# Patient Record
Sex: Male | Born: 1950
Health system: Southern US, Community
[De-identification: ages and names within clinical notes are randomized; demographics above are authoritative.]

## PROBLEM LIST (undated history)

## (undated) DIAGNOSIS — K279 Peptic ulcer, site unspecified, unspecified as acute or chronic, without hemorrhage or perforation: Secondary | ICD-10-CM

## (undated) DIAGNOSIS — I255 Ischemic cardiomyopathy: Secondary | ICD-10-CM

## (undated) DIAGNOSIS — E119 Type 2 diabetes mellitus without complications: Secondary | ICD-10-CM

## (undated) DIAGNOSIS — I739 Peripheral vascular disease, unspecified: Secondary | ICD-10-CM

## (undated) DIAGNOSIS — I10 Essential (primary) hypertension: Secondary | ICD-10-CM

## (undated) DIAGNOSIS — G4733 Obstructive sleep apnea (adult) (pediatric): Secondary | ICD-10-CM

## (undated) DIAGNOSIS — I2510 Atherosclerotic heart disease of native coronary artery without angina pectoris: Secondary | ICD-10-CM

## (undated) DIAGNOSIS — I2102 ST elevation (STEMI) myocardial infarction involving left anterior descending coronary artery: Secondary | ICD-10-CM

## (undated) DIAGNOSIS — I639 Cerebral infarction, unspecified: Secondary | ICD-10-CM

## (undated) DIAGNOSIS — H5440 Blindness, one eye, unspecified eye: Secondary | ICD-10-CM

## (undated) HISTORY — DX: Obstructive sleep apnea (adult) (pediatric): G47.33

## (undated) HISTORY — DX: Type 2 diabetes mellitus without complications: E11.9

## (undated) HISTORY — DX: Peripheral vascular disease, unspecified: I73.9

## (undated) HISTORY — DX: ST elevation (STEMI) myocardial infarction involving left anterior descending coronary artery: I21.02

## (undated) HISTORY — PX: OTHER SURGICAL HISTORY: SHX169

## (undated) HISTORY — DX: Blindness, one eye, unspecified eye: H54.40

## (undated) HISTORY — DX: Essential (primary) hypertension: I10

## (undated) HISTORY — DX: Ischemic cardiomyopathy: I25.5

## (undated) HISTORY — PX: CATARACT EXTRACTION: SUR2

---

## 2001-01-07 ENCOUNTER — Emergency Department (HOSPITAL_COMMUNITY): Admission: EM | Admit: 2001-01-07 | Discharge: 2001-01-07 | Payer: Self-pay | Admitting: Internal Medicine

## 2001-01-20 ENCOUNTER — Emergency Department (HOSPITAL_COMMUNITY): Admission: EM | Admit: 2001-01-20 | Discharge: 2001-01-20 | Payer: Self-pay | Admitting: Emergency Medicine

## 2008-02-03 ENCOUNTER — Emergency Department (HOSPITAL_COMMUNITY): Admission: EM | Admit: 2008-02-03 | Discharge: 2008-02-03 | Payer: Self-pay | Admitting: Emergency Medicine

## 2011-04-20 LAB — DIFFERENTIAL
Basophils Absolute: 0.1
Eosinophils Relative: 0
Lymphocytes Relative: 23
Lymphs Abs: 1.5
Monocytes Absolute: 0.4
Monocytes Relative: 6
Neutro Abs: 4.5

## 2011-04-20 LAB — CBC
HCT: 41.8
Hemoglobin: 13.3
RBC: 5.26
WBC: 6.4

## 2011-04-20 LAB — POCT I-STAT, CHEM 8
BUN: 9
Calcium, Ion: 1.16
Chloride: 107
Creatinine, Ser: 0.9
Glucose, Bld: 131 — ABNORMAL HIGH

## 2011-04-20 LAB — POCT URINALYSIS DIP (DEVICE)
Glucose, UA: NEGATIVE
Nitrite: NEGATIVE

## 2012-11-22 DIAGNOSIS — H401190 Primary open-angle glaucoma, unspecified eye, stage unspecified: Secondary | ICD-10-CM | POA: Insufficient documentation

## 2015-09-28 ENCOUNTER — Inpatient Hospital Stay (HOSPITAL_COMMUNITY): Payer: BLUE CROSS/BLUE SHIELD

## 2015-09-28 ENCOUNTER — Encounter (HOSPITAL_COMMUNITY): Payer: Self-pay | Admitting: Cardiology

## 2015-09-28 ENCOUNTER — Emergency Department (HOSPITAL_COMMUNITY): Payer: BLUE CROSS/BLUE SHIELD

## 2015-09-28 ENCOUNTER — Encounter (HOSPITAL_COMMUNITY)
Admission: EM | Disposition: A | Discharge status: Rehabilitation Facility | Payer: Self-pay | Source: Home / Self Care | Attending: Cardiovascular Disease

## 2015-09-28 ENCOUNTER — Inpatient Hospital Stay (HOSPITAL_COMMUNITY)
Admission: EM | Admit: 2015-09-28 | Discharge: 2015-10-15 | DRG: 246 | Disposition: A | Discharge status: Rehabilitation Facility | Payer: BLUE CROSS/BLUE SHIELD | Attending: Cardiovascular Disease | Admitting: Cardiovascular Disease

## 2015-09-28 DIAGNOSIS — Z8673 Personal history of transient ischemic attack (TIA), and cerebral infarction without residual deficits: Secondary | ICD-10-CM | POA: Insufficient documentation

## 2015-09-28 DIAGNOSIS — R410 Disorientation, unspecified: Secondary | ICD-10-CM | POA: Diagnosis not present

## 2015-09-28 DIAGNOSIS — G9340 Encephalopathy, unspecified: Secondary | ICD-10-CM | POA: Diagnosis not present

## 2015-09-28 DIAGNOSIS — I10 Essential (primary) hypertension: Secondary | ICD-10-CM | POA: Insufficient documentation

## 2015-09-28 DIAGNOSIS — Z09 Encounter for follow-up examination after completed treatment for conditions other than malignant neoplasm: Secondary | ICD-10-CM

## 2015-09-28 DIAGNOSIS — I2510 Atherosclerotic heart disease of native coronary artery without angina pectoris: Secondary | ICD-10-CM

## 2015-09-28 DIAGNOSIS — I429 Cardiomyopathy, unspecified: Secondary | ICD-10-CM | POA: Diagnosis present

## 2015-09-28 DIAGNOSIS — I469 Cardiac arrest, cause unspecified: Secondary | ICD-10-CM

## 2015-09-28 DIAGNOSIS — R54 Age-related physical debility: Secondary | ICD-10-CM | POA: Diagnosis present

## 2015-09-28 DIAGNOSIS — E874 Mixed disorder of acid-base balance: Secondary | ICD-10-CM | POA: Diagnosis present

## 2015-09-28 DIAGNOSIS — E1122 Type 2 diabetes mellitus with diabetic chronic kidney disease: Secondary | ICD-10-CM | POA: Diagnosis present

## 2015-09-28 DIAGNOSIS — N189 Chronic kidney disease, unspecified: Secondary | ICD-10-CM | POA: Diagnosis present

## 2015-09-28 DIAGNOSIS — Z833 Family history of diabetes mellitus: Secondary | ICD-10-CM | POA: Diagnosis not present

## 2015-09-28 DIAGNOSIS — I5023 Acute on chronic systolic (congestive) heart failure: Secondary | ICD-10-CM | POA: Diagnosis present

## 2015-09-28 DIAGNOSIS — I213 ST elevation (STEMI) myocardial infarction of unspecified site: Secondary | ICD-10-CM | POA: Insufficient documentation

## 2015-09-28 DIAGNOSIS — J80 Acute respiratory distress syndrome: Secondary | ICD-10-CM | POA: Diagnosis not present

## 2015-09-28 DIAGNOSIS — D72829 Elevated white blood cell count, unspecified: Secondary | ICD-10-CM | POA: Insufficient documentation

## 2015-09-28 DIAGNOSIS — E1165 Type 2 diabetes mellitus with hyperglycemia: Secondary | ICD-10-CM | POA: Diagnosis present

## 2015-09-28 DIAGNOSIS — J440 Chronic obstructive pulmonary disease with acute lower respiratory infection: Secondary | ICD-10-CM | POA: Diagnosis present

## 2015-09-28 DIAGNOSIS — T502X5A Adverse effect of carbonic-anhydrase inhibitors, benzothiadiazides and other diuretics, initial encounter: Secondary | ICD-10-CM | POA: Diagnosis not present

## 2015-09-28 DIAGNOSIS — R579 Shock, unspecified: Secondary | ICD-10-CM | POA: Diagnosis not present

## 2015-09-28 DIAGNOSIS — I129 Hypertensive chronic kidney disease with stage 1 through stage 4 chronic kidney disease, or unspecified chronic kidney disease: Secondary | ICD-10-CM | POA: Diagnosis present

## 2015-09-28 DIAGNOSIS — I63511 Cerebral infarction due to unspecified occlusion or stenosis of right middle cerebral artery: Secondary | ICD-10-CM | POA: Diagnosis not present

## 2015-09-28 DIAGNOSIS — Z720 Tobacco use: Secondary | ICD-10-CM | POA: Insufficient documentation

## 2015-09-28 DIAGNOSIS — I4892 Unspecified atrial flutter: Secondary | ICD-10-CM | POA: Diagnosis present

## 2015-09-28 DIAGNOSIS — R451 Restlessness and agitation: Secondary | ICD-10-CM | POA: Diagnosis not present

## 2015-09-28 DIAGNOSIS — R401 Stupor: Secondary | ICD-10-CM | POA: Diagnosis not present

## 2015-09-28 DIAGNOSIS — R0682 Tachypnea, not elsewhere classified: Secondary | ICD-10-CM | POA: Diagnosis not present

## 2015-09-28 DIAGNOSIS — I639 Cerebral infarction, unspecified: Secondary | ICD-10-CM | POA: Insufficient documentation

## 2015-09-28 DIAGNOSIS — R9089 Other abnormal findings on diagnostic imaging of central nervous system: Secondary | ICD-10-CM

## 2015-09-28 DIAGNOSIS — Z978 Presence of other specified devices: Secondary | ICD-10-CM

## 2015-09-28 DIAGNOSIS — J9600 Acute respiratory failure, unspecified whether with hypoxia or hypercapnia: Secondary | ICD-10-CM | POA: Insufficient documentation

## 2015-09-28 DIAGNOSIS — J9690 Respiratory failure, unspecified, unspecified whether with hypoxia or hypercapnia: Secondary | ICD-10-CM

## 2015-09-28 DIAGNOSIS — F17200 Nicotine dependence, unspecified, uncomplicated: Secondary | ICD-10-CM | POA: Diagnosis present

## 2015-09-28 DIAGNOSIS — E1140 Type 2 diabetes mellitus with diabetic neuropathy, unspecified: Secondary | ICD-10-CM | POA: Insufficient documentation

## 2015-09-28 DIAGNOSIS — Z9911 Dependence on respirator [ventilator] status: Secondary | ICD-10-CM | POA: Diagnosis not present

## 2015-09-28 DIAGNOSIS — H5442 Blindness, left eye, normal vision right eye: Secondary | ICD-10-CM | POA: Diagnosis present

## 2015-09-28 DIAGNOSIS — I2109 ST elevation (STEMI) myocardial infarction involving other coronary artery of anterior wall: Secondary | ICD-10-CM | POA: Diagnosis not present

## 2015-09-28 DIAGNOSIS — I2102 ST elevation (STEMI) myocardial infarction involving left anterior descending coronary artery: Secondary | ICD-10-CM

## 2015-09-28 DIAGNOSIS — Z452 Encounter for adjustment and management of vascular access device: Secondary | ICD-10-CM

## 2015-09-28 DIAGNOSIS — Z4659 Encounter for fitting and adjustment of other gastrointestinal appliance and device: Secondary | ICD-10-CM

## 2015-09-28 DIAGNOSIS — R071 Chest pain on breathing: Secondary | ICD-10-CM | POA: Diagnosis not present

## 2015-09-28 DIAGNOSIS — R1313 Dysphagia, pharyngeal phase: Secondary | ICD-10-CM | POA: Diagnosis present

## 2015-09-28 DIAGNOSIS — E785 Hyperlipidemia, unspecified: Secondary | ICD-10-CM | POA: Diagnosis present

## 2015-09-28 DIAGNOSIS — I69991 Dysphagia following unspecified cerebrovascular disease: Secondary | ICD-10-CM | POA: Insufficient documentation

## 2015-09-28 DIAGNOSIS — G8190 Hemiplegia, unspecified affecting unspecified side: Secondary | ICD-10-CM

## 2015-09-28 DIAGNOSIS — J15211 Pneumonia due to Methicillin susceptible Staphylococcus aureus: Secondary | ICD-10-CM | POA: Diagnosis present

## 2015-09-28 DIAGNOSIS — K5900 Constipation, unspecified: Secondary | ICD-10-CM | POA: Diagnosis present

## 2015-09-28 DIAGNOSIS — R0603 Acute respiratory distress: Secondary | ICD-10-CM

## 2015-09-28 DIAGNOSIS — G9341 Metabolic encephalopathy: Secondary | ICD-10-CM | POA: Diagnosis present

## 2015-09-28 DIAGNOSIS — G931 Anoxic brain damage, not elsewhere classified: Secondary | ICD-10-CM | POA: Diagnosis not present

## 2015-09-28 DIAGNOSIS — I69391 Dysphagia following cerebral infarction: Secondary | ICD-10-CM | POA: Diagnosis not present

## 2015-09-28 DIAGNOSIS — I2129 ST elevation (STEMI) myocardial infarction involving other sites: Secondary | ICD-10-CM

## 2015-09-28 DIAGNOSIS — J9601 Acute respiratory failure with hypoxia: Secondary | ICD-10-CM | POA: Diagnosis not present

## 2015-09-28 DIAGNOSIS — E870 Hyperosmolality and hypernatremia: Secondary | ICD-10-CM | POA: Diagnosis present

## 2015-09-28 DIAGNOSIS — E876 Hypokalemia: Secondary | ICD-10-CM | POA: Diagnosis present

## 2015-09-28 DIAGNOSIS — D6489 Other specified anemias: Secondary | ICD-10-CM | POA: Diagnosis present

## 2015-09-28 DIAGNOSIS — G8194 Hemiplegia, unspecified affecting left nondominant side: Secondary | ICD-10-CM | POA: Diagnosis present

## 2015-09-28 DIAGNOSIS — I6340 Cerebral infarction due to embolism of unspecified cerebral artery: Secondary | ICD-10-CM | POA: Diagnosis not present

## 2015-09-28 DIAGNOSIS — R4182 Altered mental status, unspecified: Secondary | ICD-10-CM | POA: Insufficient documentation

## 2015-09-28 DIAGNOSIS — Z8249 Family history of ischemic heart disease and other diseases of the circulatory system: Secondary | ICD-10-CM

## 2015-09-28 DIAGNOSIS — Z9289 Personal history of other medical treatment: Secondary | ICD-10-CM

## 2015-09-28 DIAGNOSIS — Z8711 Personal history of peptic ulcer disease: Secondary | ICD-10-CM

## 2015-09-28 DIAGNOSIS — Y95 Nosocomial condition: Secondary | ICD-10-CM | POA: Diagnosis present

## 2015-09-28 DIAGNOSIS — N179 Acute kidney failure, unspecified: Secondary | ICD-10-CM | POA: Diagnosis present

## 2015-09-28 DIAGNOSIS — D649 Anemia, unspecified: Secondary | ICD-10-CM | POA: Insufficient documentation

## 2015-09-28 DIAGNOSIS — I5021 Acute systolic (congestive) heart failure: Secondary | ICD-10-CM | POA: Diagnosis not present

## 2015-09-28 DIAGNOSIS — W19XXXA Unspecified fall, initial encounter: Secondary | ICD-10-CM

## 2015-09-28 DIAGNOSIS — J189 Pneumonia, unspecified organism: Secondary | ICD-10-CM | POA: Insufficient documentation

## 2015-09-28 HISTORY — DX: Atherosclerotic heart disease of native coronary artery without angina pectoris: I25.10

## 2015-09-28 HISTORY — PX: CARDIAC CATHETERIZATION: SHX172

## 2015-09-28 HISTORY — DX: Peptic ulcer, site unspecified, unspecified as acute or chronic, without hemorrhage or perforation: K27.9

## 2015-09-28 HISTORY — DX: Cerebral infarction, unspecified: I63.9

## 2015-09-28 HISTORY — DX: ST elevation (STEMI) myocardial infarction involving left anterior descending coronary artery: I21.02

## 2015-09-28 LAB — COMPREHENSIVE METABOLIC PANEL
ALK PHOS: 133 U/L — AB (ref 38–126)
ALT: 14 U/L — AB (ref 17–63)
AST: 26 U/L (ref 15–41)
Albumin: 3.1 g/dL — ABNORMAL LOW (ref 3.5–5.0)
Anion gap: 19 — ABNORMAL HIGH (ref 5–15)
BUN: 16 mg/dL (ref 6–20)
CALCIUM: 9.1 mg/dL (ref 8.9–10.3)
CHLORIDE: 105 mmol/L (ref 101–111)
CO2: 19 mmol/L — ABNORMAL LOW (ref 22–32)
CREATININE: 1.29 mg/dL — AB (ref 0.61–1.24)
Glucose, Bld: 350 mg/dL — ABNORMAL HIGH (ref 65–99)
Potassium: 4 mmol/L (ref 3.5–5.1)
Sodium: 143 mmol/L (ref 135–145)
TOTAL PROTEIN: 7.1 g/dL (ref 6.5–8.1)
Total Bilirubin: 1 mg/dL (ref 0.3–1.2)

## 2015-09-28 LAB — POCT I-STAT 3, ART BLOOD GAS (G3+)
ACID-BASE DEFICIT: 4 mmol/L — AB (ref 0.0–2.0)
Acid-Base Excess: 3 mmol/L — ABNORMAL HIGH (ref 0.0–2.0)
Acid-base deficit: 14 mmol/L — ABNORMAL HIGH (ref 0.0–2.0)
BICARBONATE: 19.1 meq/L — AB (ref 20.0–24.0)
BICARBONATE: 24.5 meq/L — AB (ref 20.0–24.0)
BICARBONATE: 30.8 meq/L — AB (ref 20.0–24.0)
O2 SAT: 100 %
O2 SAT: 98 %
O2 Saturation: 100 %
PCO2 ART: 83.7 mmHg — AB (ref 35.0–45.0)
PO2 ART: 179 mmHg — AB (ref 80.0–100.0)
PO2 ART: 274 mmHg — AB (ref 80.0–100.0)
PO2 ART: 328 mmHg — AB (ref 80.0–100.0)
TCO2: 22 mmol/L (ref 0–100)
TCO2: 26 mmol/L (ref 0–100)
TCO2: 33 mmol/L (ref 0–100)
pCO2 arterial: 63.1 mmHg (ref 35.0–45.0)
pCO2 arterial: 53.3 mmHg — ABNORMAL HIGH (ref 35.0–45.0)
pH, Arterial: 6.966 — CL (ref 7.350–7.450)
pH, Arterial: 7.198 — CL (ref 7.350–7.450)
pH, Arterial: 7.356 (ref 7.350–7.450)

## 2015-09-28 LAB — POCT I-STAT, CHEM 8
BUN: 19 mg/dL (ref 6–20)
BUN: 18 mg/dL (ref 6–20)
BUN: 24 mg/dL — ABNORMAL HIGH (ref 6–20)
BUN: 19 mg/dL (ref 6–20)
CALCIUM ION: 1.14 mmol/L (ref 1.13–1.30)
CALCIUM ION: 0.94 mmol/L — AB (ref 1.13–1.30)
CHLORIDE: 107 mmol/L (ref 101–111)
CHLORIDE: 110 mmol/L (ref 101–111)
CREATININE: 1.1 mg/dL (ref 0.61–1.24)
CREATININE: 0.7 mg/dL (ref 0.61–1.24)
Calcium, Ion: 1.03 mmol/L — ABNORMAL LOW (ref 1.13–1.30)
Calcium, Ion: 1.1 mmol/L — ABNORMAL LOW (ref 1.13–1.30)
Chloride: 105 mmol/L (ref 101–111)
Chloride: 108 mmol/L (ref 101–111)
Creatinine, Ser: 0.9 mg/dL (ref 0.61–1.24)
Creatinine, Ser: 0.7 mg/dL (ref 0.61–1.24)
GLUCOSE: 414 mg/dL — AB (ref 65–99)
GLUCOSE: 133 mg/dL — AB (ref 65–99)
Glucose, Bld: 325 mg/dL — ABNORMAL HIGH (ref 65–99)
Glucose, Bld: 254 mg/dL — ABNORMAL HIGH (ref 65–99)
HCT: 40 % (ref 39.0–52.0)
HEMATOCRIT: 38 % — AB (ref 39.0–52.0)
HEMATOCRIT: 39 % (ref 39.0–52.0)
HEMATOCRIT: 39 % (ref 39.0–52.0)
HEMOGLOBIN: 13.3 g/dL (ref 13.0–17.0)
HEMOGLOBIN: 13.3 g/dL (ref 13.0–17.0)
Hemoglobin: 13.6 g/dL (ref 13.0–17.0)
Hemoglobin: 12.9 g/dL — ABNORMAL LOW (ref 13.0–17.0)
POTASSIUM: 3.5 mmol/L (ref 3.5–5.1)
POTASSIUM: 2.9 mmol/L — AB (ref 3.5–5.1)
Potassium: 2.8 mmol/L — ABNORMAL LOW (ref 3.5–5.1)
Potassium: 3.5 mmol/L (ref 3.5–5.1)
SODIUM: 146 mmol/L — AB (ref 135–145)
SODIUM: 148 mmol/L — AB (ref 135–145)
SODIUM: 150 mmol/L — AB (ref 135–145)
Sodium: 145 mmol/L (ref 135–145)
TCO2: 22 mmol/L (ref 0–100)
TCO2: 28 mmol/L (ref 0–100)
TCO2: 25 mmol/L (ref 0–100)
TCO2: 23 mmol/L (ref 0–100)

## 2015-09-28 LAB — GLUCOSE, CAPILLARY
GLUCOSE-CAPILLARY: 321 mg/dL — AB (ref 65–99)
GLUCOSE-CAPILLARY: 248 mg/dL — AB (ref 65–99)
GLUCOSE-CAPILLARY: 69 mg/dL (ref 65–99)
GLUCOSE-CAPILLARY: 124 mg/dL — AB (ref 65–99)
GLUCOSE-CAPILLARY: 116 mg/dL — AB (ref 65–99)
GLUCOSE-CAPILLARY: 145 mg/dL — AB (ref 65–99)
GLUCOSE-CAPILLARY: 145 mg/dL — AB (ref 65–99)
GLUCOSE-CAPILLARY: 156 mg/dL — AB (ref 65–99)
GLUCOSE-CAPILLARY: 156 mg/dL — AB (ref 65–99)
Glucose-Capillary: 275 mg/dL — ABNORMAL HIGH (ref 65–99)
Glucose-Capillary: 282 mg/dL — ABNORMAL HIGH (ref 65–99)
Glucose-Capillary: 222 mg/dL — ABNORMAL HIGH (ref 65–99)
Glucose-Capillary: 280 mg/dL — ABNORMAL HIGH (ref 65–99)
Glucose-Capillary: 175 mg/dL — ABNORMAL HIGH (ref 65–99)
Glucose-Capillary: 149 mg/dL — ABNORMAL HIGH (ref 65–99)
Glucose-Capillary: 169 mg/dL — ABNORMAL HIGH (ref 65–99)

## 2015-09-28 LAB — CBC
HCT: 42.2 % (ref 39.0–52.0)
Hemoglobin: 12.5 g/dL — ABNORMAL LOW (ref 13.0–17.0)
MCH: 24.1 pg — AB (ref 26.0–34.0)
MCHC: 29.6 g/dL — ABNORMAL LOW (ref 30.0–36.0)
MCV: 81.5 fL (ref 78.0–100.0)
PLATELETS: 168 10*3/uL (ref 150–400)
RBC: 5.18 MIL/uL (ref 4.22–5.81)
RDW: 13 % (ref 11.5–15.5)
WBC: 10.3 10*3/uL (ref 4.0–10.5)

## 2015-09-28 LAB — BLOOD GAS, ARTERIAL
ACID-BASE DEFICIT: 0.3 mmol/L (ref 0.0–2.0)
BICARBONATE: 24.3 meq/L — AB (ref 20.0–24.0)
Drawn by: 358491
FIO2: 0.4
LHR: 28 {breaths}/min
O2 SAT: 98.7 %
PATIENT TEMPERATURE: 91.4
PCO2 ART: 35.7 mmHg (ref 35.0–45.0)
PEEP/CPAP: 5 cmH2O
PH ART: 7.425 (ref 7.350–7.450)
TCO2: 25.7 mmol/L (ref 0–100)
VT: 550 mL
pO2, Arterial: 117 mmHg — ABNORMAL HIGH (ref 80.0–100.0)

## 2015-09-28 LAB — BASIC METABOLIC PANEL
ANION GAP: 12 (ref 5–15)
Anion gap: 12 (ref 5–15)
Anion gap: 14 (ref 5–15)
BUN: 15 mg/dL (ref 6–20)
BUN: 17 mg/dL (ref 6–20)
BUN: 16 mg/dL (ref 6–20)
CHLORIDE: 110 mmol/L (ref 101–111)
CO2: 24 mmol/L (ref 22–32)
CO2: 22 mmol/L (ref 22–32)
CO2: 21 mmol/L — AB (ref 22–32)
Calcium: 7 mg/dL — ABNORMAL LOW (ref 8.9–10.3)
Calcium: 8.2 mg/dL — ABNORMAL LOW (ref 8.9–10.3)
Calcium: 8.5 mg/dL — ABNORMAL LOW (ref 8.9–10.3)
Chloride: 112 mmol/L — ABNORMAL HIGH (ref 101–111)
Chloride: 112 mmol/L — ABNORMAL HIGH (ref 101–111)
Creatinine, Ser: 1 mg/dL (ref 0.61–1.24)
Creatinine, Ser: 0.81 mg/dL (ref 0.61–1.24)
Creatinine, Ser: 0.76 mg/dL (ref 0.61–1.24)
GFR calc Af Amer: >60 mL/min (ref >60)
GFR calc Af Amer: >60 mL/min (ref >60)
GFR calc Af Amer: >60 mL/min (ref >60)
GFR calc non Af Amer: >60 mL/min (ref >60)
GLUCOSE: 304 mg/dL — AB (ref 65–99)
GLUCOSE: 143 mg/dL — AB (ref 65–99)
GLUCOSE: 176 mg/dL — AB (ref 65–99)
POTASSIUM: 3.7 mmol/L (ref 3.5–5.1)
POTASSIUM: 4.2 mmol/L (ref 3.5–5.1)
POTASSIUM: 3.7 mmol/L (ref 3.5–5.1)
Sodium: 146 mmol/L — ABNORMAL HIGH (ref 135–145)
Sodium: 146 mmol/L — ABNORMAL HIGH (ref 135–145)
Sodium: 147 mmol/L — ABNORMAL HIGH (ref 135–145)

## 2015-09-28 LAB — POCT I-STAT TROPONIN I: TROPONIN I, POC: 0.42 ng/mL — AB (ref 0.00–0.08)

## 2015-09-28 LAB — PROTIME-INR
INR: 1.17 (ref 0.00–1.49)
INR: 2.21 — ABNORMAL HIGH (ref 0.00–1.49)
INR: 1.41 (ref 0.00–1.49)
PROTHROMBIN TIME: 15.1 s (ref 11.6–15.2)
Prothrombin Time: 24.3 seconds — ABNORMAL HIGH (ref 11.6–15.2)
Prothrombin Time: 17.4 seconds — ABNORMAL HIGH (ref 11.6–15.2)

## 2015-09-28 LAB — MRSA PCR SCREENING: MRSA BY PCR: NEGATIVE

## 2015-09-28 LAB — APTT
APTT: 43 s — AB (ref 24–37)
aPTT: 23 seconds — ABNORMAL LOW (ref 24–37)
aPTT: 66 seconds — ABNORMAL HIGH (ref 24–37)

## 2015-09-28 LAB — POCT ACTIVATED CLOTTING TIME
ACTIVATED CLOTTING TIME: 188 s
Activated Clotting Time: 420 seconds

## 2015-09-28 LAB — TROPONIN I
Troponin I: 30.84 ng/mL (ref <0.031)
Troponin I: 60.5 ng/mL (ref <0.031)

## 2015-09-28 LAB — LACTIC ACID, PLASMA
Lactic Acid, Venous: 3.4 mmol/L (ref 0.5–2.0)
Lactic Acid, Venous: 2.5 mmol/L (ref 0.5–2.0)

## 2015-09-28 LAB — MAGNESIUM: Magnesium: 1.7 mg/dL (ref 1.7–2.4)

## 2015-09-28 SURGERY — LEFT HEART CATH AND CORONARY ANGIOGRAPHY
Anesthesia: LOCAL

## 2015-09-28 MED ORDER — BIVALIRUDIN 250 MG IV SOLR
INTRAVENOUS | Status: AC
  Filled 2015-09-28: qty 250

## 2015-09-28 MED ORDER — ASPIRIN 81 MG PO CHEW: 81.0000 mg | CHEWABLE_TABLET | Freq: Every day | ORAL | Status: DC

## 2015-09-28 MED ORDER — ARTIFICIAL TEARS OP OINT
1.0000 "application " | TOPICAL_OINTMENT | Freq: Three times a day (TID) | OPHTHALMIC | Status: DC
  Administered 2015-09-28 – 2015-09-29 (×6): 1 via OPHTHALMIC
  Filled 2015-09-28: qty 3.5

## 2015-09-28 MED ORDER — INFLUENZA VAC SPLIT QUAD 0.5 ML IM SUSY: 0.5000 mL | PREFILLED_SYRINGE | INTRAMUSCULAR | Status: DC | PRN

## 2015-09-28 MED ORDER — MIDAZOLAM HCL 2 MG/2ML IJ SOLN
1.0000 mg | Freq: Once | INTRAMUSCULAR | Status: AC
  Administered 2015-09-28: 1 mg via INTRAVENOUS

## 2015-09-28 MED ORDER — SODIUM BICARBONATE 8.4 % IV SOLN
INTRAVENOUS | Status: DC | PRN
  Administered 2015-09-28: 50 meq via INTRAVENOUS
  Administered 2015-09-28: 100 meq via INTRAVENOUS
  Administered 2015-09-28: 50 meq via INTRAVENOUS

## 2015-09-28 MED ORDER — FENTANYL CITRATE (PF) 100 MCG/2ML IJ SOLN: 50.0000 ug | Freq: Once | INTRAMUSCULAR | Status: DC

## 2015-09-28 MED ORDER — DEXTROSE 50 % IV SOLN
12.5000 mL | Freq: Once | INTRAVENOUS | Status: AC
  Administered 2015-09-28: 12.5 mL via INTRAVENOUS

## 2015-09-28 MED ORDER — ONDANSETRON HCL 4 MG/2ML IJ SOLN: 4.0000 mg | Freq: Four times a day (QID) | INTRAMUSCULAR | Status: DC | PRN

## 2015-09-28 MED ORDER — SODIUM CHLORIDE 0.9% FLUSH: 3.0000 mL | Freq: Two times a day (BID) | INTRAVENOUS | Status: DC

## 2015-09-28 MED ORDER — SODIUM CHLORIDE 0.9 % IV SOLN
2000.0000 mL | Freq: Once | INTRAVENOUS | Status: AC
  Administered 2015-09-28: 2000 mL via INTRAVENOUS

## 2015-09-28 MED ORDER — ASPIRIN 300 MG RE SUPP
300.0000 mg | RECTAL | Status: DC
Start: 2015-09-28 — End: 2015-09-28

## 2015-09-28 MED ORDER — DEXTROSE 5 % IV SOLN
0.0000 ug/min | INTRAVENOUS | Status: DC
  Administered 2015-09-28: 6 ug/min via INTRAVENOUS
  Administered 2015-09-30: 2 ug/min via INTRAVENOUS
  Filled 2015-09-28 (×2): qty 4

## 2015-09-28 MED ORDER — HEPARIN (PORCINE) IN NACL 2-0.9 UNIT/ML-% IJ SOLN
INTRAMUSCULAR | Status: AC
  Filled 2015-09-28: qty 500

## 2015-09-28 MED ORDER — SODIUM CHLORIDE 0.9 % WEIGHT BASED INFUSION: 1.0000 mL/kg/h | INTRAVENOUS | Status: DC

## 2015-09-28 MED ORDER — SODIUM BICARBONATE 8.4 % IV SOLN
INTRAVENOUS | Status: AC
  Filled 2015-09-28: qty 50

## 2015-09-28 MED ORDER — TICAGRELOR 90 MG PO TABS
90.0000 mg | ORAL_TABLET | Freq: Two times a day (BID) | ORAL | Status: DC
  Administered 2015-09-28 – 2015-10-15 (×32): 90 mg via ORAL
  Filled 2015-09-28 (×33): qty 1

## 2015-09-28 MED ORDER — MORPHINE SULFATE (PF) 2 MG/ML IV SOLN: 2.0000 mg | INTRAVENOUS | Status: DC | PRN

## 2015-09-28 MED ORDER — ACETAMINOPHEN 325 MG PO TABS: 650.0000 mg | ORAL_TABLET | ORAL | Status: DC | PRN

## 2015-09-28 MED ORDER — INSULIN ASPART 100 UNIT/ML ~~LOC~~ SOLN
2.0000 [IU] | SUBCUTANEOUS | Status: DC
  Administered 2015-09-28: 4 [IU] via SUBCUTANEOUS
  Administered 2015-09-29: 2 [IU] via SUBCUTANEOUS
  Administered 2015-10-01 (×3): 4 [IU] via SUBCUTANEOUS
  Administered 2015-10-02 (×2): 6 [IU] via SUBCUTANEOUS
  Administered 2015-10-02 (×3): 2 [IU] via SUBCUTANEOUS
  Administered 2015-10-03: 4 [IU] via SUBCUTANEOUS
  Administered 2015-10-03: 2 [IU] via SUBCUTANEOUS
  Administered 2015-10-03: 4 [IU] via SUBCUTANEOUS
  Administered 2015-10-03 (×2): 6 [IU] via SUBCUTANEOUS
  Administered 2015-10-03: 4 [IU] via SUBCUTANEOUS
  Administered 2015-10-04 (×2): 2 [IU] via SUBCUTANEOUS
  Administered 2015-10-04: 6 [IU] via SUBCUTANEOUS
  Administered 2015-10-05 (×2): 2 [IU] via SUBCUTANEOUS

## 2015-09-28 MED ORDER — POTASSIUM CHLORIDE 10 MEQ/50ML IV SOLN
10.0000 meq | INTRAVENOUS | Status: AC
  Administered 2015-09-28 (×4): 10 meq via INTRAVENOUS
  Filled 2015-09-28 (×2): qty 50

## 2015-09-28 MED ORDER — CHLORHEXIDINE GLUCONATE 0.12% ORAL RINSE (MEDLINE KIT)
15.0000 mL | Freq: Two times a day (BID) | OROMUCOSAL | Status: DC
  Administered 2015-09-28 – 2015-10-13 (×31): 15 mL via OROMUCOSAL

## 2015-09-28 MED ORDER — SODIUM CHLORIDE 0.9 % IV SOLN
INTRAVENOUS | Status: DC | PRN
  Administered 2015-09-28: 1000 mL via INTRAVENOUS

## 2015-09-28 MED ORDER — SODIUM CHLORIDE 0.9 % IV SOLN
INTRAVENOUS | Status: DC
  Administered 2015-09-28: 2.2 [IU]/h via INTRAVENOUS

## 2015-09-28 MED ORDER — BIVALIRUDIN BOLUS VIA INFUSION - CUPID
INTRAVENOUS | Status: DC | PRN
  Administered 2015-09-28: 75 mg/kg/min via INTRAVENOUS

## 2015-09-28 MED ORDER — HEPARIN SODIUM (PORCINE) 5000 UNIT/ML IJ SOLN
4000.0000 [IU] | Freq: Once | INTRAMUSCULAR | Status: AC
  Administered 2015-09-28: 4000 [IU] via INTRAVENOUS

## 2015-09-28 MED ORDER — FENTANYL BOLUS VIA INFUSION
50.0000 ug | INTRAVENOUS | Status: DC | PRN
  Filled 2015-09-28: qty 50

## 2015-09-28 MED ORDER — ROCURONIUM BROMIDE 50 MG/5ML IV SOLN
INTRAVENOUS | Status: AC | PRN
  Administered 2015-09-28: 100 mg via INTRAVENOUS

## 2015-09-28 MED ORDER — SODIUM CHLORIDE 0.9 % IV SOLN: 250.0000 mL | INTRAVENOUS | Status: DC | PRN

## 2015-09-28 MED ORDER — MIDAZOLAM BOLUS VIA INFUSION
1.0000 mg | INTRAVENOUS | Status: DC | PRN
  Administered 2015-10-01: 1 mg via INTRAVENOUS
  Filled 2015-09-28 (×2): qty 1

## 2015-09-28 MED ORDER — SODIUM CHLORIDE 0.9 % IV SOLN
0.0000 mg/h | INTRAVENOUS | Status: DC
  Administered 2015-09-28: 2 mg/h via INTRAVENOUS
  Administered 2015-09-28 (×2): 5 mg/h via INTRAVENOUS
  Administered 2015-09-29: 3 mg/h via INTRAVENOUS
  Filled 2015-09-28 (×5): qty 10

## 2015-09-28 MED ORDER — INSULIN ASPART 100 UNIT/ML ~~LOC~~ SOLN: 2.0000 [IU] | SUBCUTANEOUS | Status: DC

## 2015-09-28 MED ORDER — CISATRACURIUM BOLUS VIA INFUSION
0.1000 mg/kg | Freq: Once | INTRAVENOUS | Status: AC
  Administered 2015-09-28: 6.9 mg via INTRAVENOUS
  Filled 2015-09-28: qty 7

## 2015-09-28 MED ORDER — BIVALIRUDIN 250 MG IV SOLR
250.0000 mg | INTRAVENOUS | Status: DC | PRN
  Administered 2015-09-28: 1.75 mg/kg/h via INTRAVENOUS

## 2015-09-28 MED ORDER — ANTISEPTIC ORAL RINSE SOLUTION (CORINZ)
7.0000 mL | OROMUCOSAL | Status: DC
  Administered 2015-09-28 – 2015-10-13 (×152): 7 mL via OROMUCOSAL

## 2015-09-28 MED ORDER — SODIUM CHLORIDE 0.9 % IV SOLN: 25.0000 ug/h | INTRAVENOUS | Status: DC

## 2015-09-28 MED ORDER — ALBUTEROL SULFATE (2.5 MG/3ML) 0.083% IN NEBU
2.5000 mg | INHALATION_SOLUTION | RESPIRATORY_TRACT | Status: DC | PRN
  Administered 2015-10-12: 2.5 mg via RESPIRATORY_TRACT
  Filled 2015-09-28: qty 3

## 2015-09-28 MED ORDER — ASPIRIN 300 MG RE SUPP
300.0000 mg | Freq: Once | RECTAL | Status: AC
  Administered 2015-09-28: 300 mg via RECTAL

## 2015-09-28 MED ORDER — MIDAZOLAM HCL 2 MG/2ML IJ SOLN
INTRAMUSCULAR | Status: AC
  Filled 2015-09-28: qty 2

## 2015-09-28 MED ORDER — TICAGRELOR 90 MG PO TABS
ORAL_TABLET | ORAL | Status: DC | PRN
  Administered 2015-09-28: 180 mg via JEJUNOSTOMY

## 2015-09-28 MED ORDER — SODIUM CHLORIDE 0.9 % IV SOLN: INTRAVENOUS | Status: DC

## 2015-09-28 MED ORDER — SODIUM CHLORIDE 0.9 % IV SOLN
INTRAVENOUS | Status: DC
  Administered 2015-09-28 – 2015-09-29 (×3): via INTRAVENOUS

## 2015-09-28 MED ORDER — TICAGRELOR 90 MG PO TABS
ORAL_TABLET | ORAL | Status: AC
  Filled 2015-09-28: qty 1

## 2015-09-28 MED ORDER — SODIUM CHLORIDE 0.9 % IV SOLN: 1.0000 mg/h | INTRAVENOUS | Status: DC

## 2015-09-28 MED ORDER — FENTANYL BOLUS VIA INFUSION
25.0000 ug | INTRAVENOUS | Status: DC | PRN
  Administered 2015-10-02: 25 ug via INTRAVENOUS
  Filled 2015-09-28: qty 25

## 2015-09-28 MED ORDER — IOHEXOL 350 MG/ML SOLN
INTRAVENOUS | Status: DC | PRN
  Administered 2015-09-28: 140 mL via INTRA_ARTERIAL

## 2015-09-28 MED ORDER — INSULIN REGULAR HUMAN 100 UNIT/ML IJ SOLN
INTRAMUSCULAR | Status: DC
  Filled 2015-09-28: qty 2.5

## 2015-09-28 MED ORDER — PANTOPRAZOLE SODIUM 40 MG IV SOLR
40.0000 mg | INTRAVENOUS | Status: DC
  Administered 2015-09-28 – 2015-10-02 (×5): 40 mg via INTRAVENOUS
  Filled 2015-09-28 (×5): qty 40

## 2015-09-28 MED ORDER — DEXTROSE 50 % IV SOLN
INTRAVENOUS | Status: AC
  Filled 2015-09-28: qty 50

## 2015-09-28 MED ORDER — HEPARIN SODIUM (PORCINE) 5000 UNIT/ML IJ SOLN
5000.0000 [IU] | Freq: Three times a day (TID) | INTRAMUSCULAR | Status: DC
  Administered 2015-09-28 – 2015-10-08 (×29): 5000 [IU] via SUBCUTANEOUS
  Filled 2015-09-28 (×28): qty 1

## 2015-09-28 MED ORDER — FENTANYL CITRATE (PF) 100 MCG/2ML IJ SOLN
INTRAMUSCULAR | Status: AC
  Filled 2015-09-28: qty 2

## 2015-09-28 MED ORDER — ETOMIDATE 2 MG/ML IV SOLN
INTRAVENOUS | Status: AC | PRN
  Administered 2015-09-28: 20 mg via INTRAVENOUS

## 2015-09-28 MED ORDER — SODIUM CHLORIDE 0.9 % IV SOLN
25.0000 ug/h | INTRAVENOUS | Status: DC
  Administered 2015-09-28: 50 ug/h via INTRAVENOUS
  Administered 2015-09-28: 150 ug/h via INTRAVENOUS
  Administered 2015-09-29: 200 ug/h via INTRAVENOUS
  Administered 2015-09-29 (×2): 300 ug/h via INTRAVENOUS
  Filled 2015-09-28 (×5): qty 50

## 2015-09-28 MED ORDER — SODIUM CHLORIDE 0.9% FLUSH: 3.0000 mL | INTRAVENOUS | Status: DC | PRN

## 2015-09-28 MED ORDER — SODIUM CHLORIDE 0.9 % IV SOLN
1.0000 mg/kg/h | INTRAVENOUS | Status: AC
  Administered 2015-09-28: 1 mg/kg/h via INTRAVENOUS
  Filled 2015-09-28: qty 250

## 2015-09-28 MED ORDER — SODIUM BICARBONATE 8.4 % IV SOLN
INTRAVENOUS | Status: AC
Start: 2015-09-28 — End: 2015-09-28
  Filled 2015-09-28: qty 100

## 2015-09-28 MED ORDER — HEPARIN SODIUM (PORCINE) 5000 UNIT/ML IJ SOLN: 4000.0000 [IU] | INTRAMUSCULAR | Status: DC

## 2015-09-28 MED ORDER — SODIUM CHLORIDE 0.9 % IV SOLN
1.0000 ug/kg/min | INTRAVENOUS | Status: DC
  Administered 2015-09-28: 1 ug/kg/min via INTRAVENOUS
  Administered 2015-09-29: 1.5 ug/kg/min via INTRAVENOUS
  Filled 2015-09-28 (×2): qty 20

## 2015-09-28 MED ORDER — INSULIN GLARGINE 100 UNIT/ML ~~LOC~~ SOLN
10.0000 [IU] | SUBCUTANEOUS | Status: DC
  Administered 2015-09-28: 10 [IU] via SUBCUTANEOUS
  Filled 2015-09-28 (×2): qty 0.1

## 2015-09-28 MED ORDER — ASPIRIN 81 MG PO CHEW
81.0000 mg | CHEWABLE_TABLET | Freq: Every day | ORAL | Status: DC
  Administered 2015-09-29 – 2015-10-15 (×16): 81 mg
  Filled 2015-09-28 (×16): qty 1

## 2015-09-28 MED ORDER — POTASSIUM CHLORIDE 20 MEQ/15ML (10%) PO SOLN
80.0000 meq | Freq: Once | ORAL | Status: AC
  Administered 2015-09-28: 80 meq
  Filled 2015-09-28: qty 60

## 2015-09-28 MED ORDER — LIDOCAINE HCL (PF) 1 % IJ SOLN
INTRAMUSCULAR | Status: AC
  Filled 2015-09-28: qty 30

## 2015-09-28 MED ORDER — CISATRACURIUM BOLUS VIA INFUSION
0.0500 mg/kg | INTRAVENOUS | Status: DC | PRN
  Filled 2015-09-28: qty 4

## 2015-09-28 MED ORDER — LIDOCAINE HCL (PF) 1 % IJ SOLN
INTRAMUSCULAR | Status: DC | PRN
  Administered 2015-09-28: 20 mL via INTRADERMAL

## 2015-09-28 SURGICAL SUPPLY — 17 items
BALLN EMERGE MR 2.0X12 (BALLOONS) ×2
BALLN ~~LOC~~ EMERGE MR 3.25X12 (BALLOONS) ×2
BALLOON EMERGE MR 2.0X12 (BALLOONS) ×1 IMPLANT
BALLOON ~~LOC~~ EMERGE MR 3.25X12 (BALLOONS) ×1 IMPLANT
CATH EXPO 5F ANGLED MULTIPACK (CATHETERS) ×2 IMPLANT
CATH VISTA GUIDE 6FR XBLAD3.5 (CATHETERS) ×2 IMPLANT
DEVICE CONTINUOUS FLUSH (MISCELLANEOUS) ×2 IMPLANT
KIT ENCORE 26 ADVANTAGE (KITS) ×2 IMPLANT
KIT HEART LEFT (KITS) ×2 IMPLANT
PACK CARDIAC CATHETERIZATION (CUSTOM PROCEDURE TRAY) ×2 IMPLANT
SHEATH PINNACLE 6F 10CM (SHEATH) ×2 IMPLANT
STENT SYNERGY DES 3X16 (Permanent Stent) ×2 IMPLANT
SYR MEDRAD MARK V 150ML (SYRINGE) ×2 IMPLANT
TRANSDUCER W/STOPCOCK (MISCELLANEOUS) ×2 IMPLANT
TUBING CIL FLEX 10 FLL-RA (TUBING) ×2 IMPLANT
WIRE ASAHI PROWATER 180CM (WIRE) ×2 IMPLANT
WIRE EMERALD 3MM-J .035X150CM (WIRE) ×2 IMPLANT

## 2015-09-28 NOTE — Progress Notes (Signed)
RT assisted transfer of intubated/vented patient to CATH lab. Report given to Saline, RT.

## 2015-09-28 NOTE — Progress Notes (Signed)
CRITICAL VALUE ALERT  Critical value received:  Lactic acid 3.4, troponin 30.84  Date of notification:  09/28/15  Time of notification:  0720, 0740  Critical value read back:Yes.    Nurse who received alert:  Renita Papa, RN  MD notified (1st page):  CCM  Time of first page:  [phone]

## 2015-09-28 NOTE — Care Management Note (Signed)
Case Management Note  Patient Details  Name: Mohamad Westgate MRN: [id number] Date of Birth: 05/25/1961  Subjective/Objective:    Adm w cardiac arrest, vent                Action/Plan: lives w wife   Expected Discharge Date:                  Expected Discharge Plan:     In-House Referral:     Discharge planning Services     Post Acute Care Choice:    Choice offered to:     DME Arranged:    DME Agency:     HH Arranged:    HH Agency:     Status of Service:     Medicare Important Message Given:    Date Medicare IM Given:    Medicare IM give by:    Date Additional Medicare IM Given:    Additional Medicare Important Message give by:     If discussed at Long Length of Stay Meetings, dates discussed:    Additional Comments: ur review done  Hanley Hays, RN 09/28/2015, 7:29 AM

## 2015-09-28 NOTE — Code Documentation (Signed)
Pulses present, CPR stopped.  

## 2015-09-28 NOTE — Progress Notes (Signed)
Echocardiogram 2D Echocardiogram has been performed.  Nolon Rod 09/28/2015, 2:46 PM

## 2015-09-28 NOTE — Progress Notes (Signed)
EEG Completed; Results Pending  

## 2015-09-28 NOTE — Progress Notes (Signed)
Chaplain ws paged for a Stemi. Chaplain responded while Pt was still in EMS. Pt arrested while in route. Pulse was restored .Chaplain waited until family arrived. Chaplain escorted spouse to waiting area and provided nourishment. Chaplain left and returned and sat with family until Dr. Truitt Merle spouse of Pt's condition. Chaplain escorted spouse to [facility] waiting and notified nurse.    09/28/15 0500  Clinical Encounter Type  Visited With Family  Visit Type Initial;Spiritual support  Referral From Care management  Spiritual Encounters  Spiritual Needs Prayer;Emotional  Stress Factors  Family Stress Factors Major life changes

## 2015-09-28 NOTE — Code Documentation (Signed)
Pt escorted to cath lab by this RN.

## 2015-09-28 NOTE — H&P (Signed)
CARDIOLOGY ADMISSION NOTE  Patient ID: Eddie [name] MRN: [id number] DOB/AGE: 65/08/1960 65 y.o.  Admit date: 09/28/2015 Primary Physician   None Primary Cardiologist   None Chief Complaint    Chest pain  HPI:  The patient presented with chest pain.  He is intubated and unresponsive.  The history comes from EMS staff and his wife.  He apparently started to have chest pain about one hour ago.   Intial EKG showed sinus tachycardia with ST elevation in V2 through V4.  EMS was called.  He became unresponsive shortly before arriving in the ED.  He was intubated in the ambulance and treated with Epi for presumed PEA.  He did get brief chest compressions.  He responded by developing atrial flutter and became hypertensive.  King airway was replaced and he is brought urgently to the ED.  Per his wife he had no recent complaints.  He has not been having any chest pain.     Past Medical History  Diagnosis Date  . PUD (peptic ulcer disease)     Past Surgical History  Procedure Laterality Date  . Repair of peptic ulcer      Allergies not on file No current facility-administered medications on file prior to encounter.   No current outpatient prescriptions on file prior to encounter.   Social History   Social History  . Marital Status: N/A    Spouse Name: N/A  . Number of Children: N/A  . Years of Education: N/A   Occupational History  . Not on file.   Social History Main Topics  . Smoking status: Not on file  . Smokeless tobacco: Not on file  . Alcohol Use: Not on file  . Drug Use: Not on file  . Sexual Activity: Not on file   Other Topics Concern  . Not on file   Social History Narrative  . No narrative on file    FAMILY HISTORY:  Unable to obtain.  ROS:  As stated in the HPI and negative for all other systems.  Physical Exam: Blood pressure 190/125, pulse 141, resp. rate 49, SpO2 99 %.  GENERAL:   Critically ill appearing HEENT:  Pupils fixed and orally intubated.  NECK:   No jugular venous distention, waveform within normal limits, carotid upstroke brisk and symmetric, no bruits, no thyromegaly LYMPHATICS:  No cervical, inguinal adenopathy LUNGS:  Clear to auscultation bilaterally BACK:  No CVA tenderness CHEST:  Unremarkable HEART:  PMI not displaced or sustained,S1 and S2 within normal limits, no S3, no S4, no clicks, no rubs, no murmurs ABD:  Flat, decreased bowel sounds, no bruits, no rebound, no guarding, no midline pulsatile mass, no hepatomegaly, no splenomegaly EXT:  2 plus pulses throughout, no edema, no cyanosis no clubbing SKIN:  No rashes no nodules NEURO:  Cranial nerves II through XII grossly intact, motor grossly intact throughout PSYCH:  Cognitively intact, oriented to person place and time   Labs: No results found for: BUN No results found for: CREATININE No results found for: NA, K, CL, CO2 No results found for: TROPONINI Lab Results  Component Value Date   WBC 10.3 09/28/2015   HGB 12.5* 09/28/2015   HCT 42.2 09/28/2015   MCV 81.5 09/28/2015   PLT 168 09/28/2015      Radiology:  CXR:  Diffuse edema  EKG:  As above.   ASSESSMENT AND PLAN:    ACUTE ANTERIOR MI WITH CARDIAC ARREST:  The patient will be taken urgently to the cath lab.  TOBACCO ABUSE:  The patient will be educated.    SignedRollene Rotunda 09/28/2015, 4:47 AM

## 2015-09-28 NOTE — Code Documentation (Signed)
Pt arrives via [facility] for [facility], during transport pt deteriorated significantly and became unresponsive. Bagged via EMS. Stemi noted on EMS EKG. PEA arrest at 0401 on EMS dock. Brought into room, ROSC achieved at 0407. 1 EPI given by EMS. King airway and left IO placed by EMS.

## 2015-09-28 NOTE — Procedures (Signed)
ELECTROENCEPHALOGRAM REPORT  Patient: Eddie [name]       Room #: 1O84 EEG No. ID: [id number] Age: 65 y.o.        Sex: male Referring Physician: Erlene Quan Report Date:  09/28/2015        Interpreting Physician: Aline Brochure  History: Eddie [name] is an 65 y.o. male who presented with an acute STEMI and PEA arrest.  Indications for study: Assess severity of encephalopathy; rule out seizure activity.   Technique: This is an 18 channel routine scalp EEG performed at the bedside with bipolar and monopolar montages arranged in accordance to the international 10/20 system of electrode placement.   Description: Patient was intubated and on mechanical ventilation at the time of this study. He was also hypothermic with core temperature of 32.9 Celsius, and sedated with Versed and fentanyl. Cerebral cortical activity was diffusely low in amplitude, markedly suppressed. The predominant activity was mixed delta and theta activity with occasional runs of rhythmic fast activity which appeared to be most prominent in the central head region. Fairly frequent vertex sharp waves were also recorded. Photic stimulation was not performed. No epileptiform discharges were recorded.  Interpretation: This EEG is abnormal with markedly suppressed continuous generalized moderately severe slowing of cerebral activity which in part is likely due to hypothermia, as well as sedating medications. Extent of encephalopathic process, if present, cannot be determined at this point. Repeat study when patient's core temperature is back to normal is recommended area no evidence of a platform activity was demonstrated.   Venetia Maxon M.D. [facility] [phone]

## 2015-09-28 NOTE — ED Provider Notes (Signed)
CSN: [id number]     Arrival date & time 09/28/15  0402 History  By signing my name below, I, Bethel Born, attest that this documentation has been prepared under the direction and in the presence of Shon Baton, MD. Electronically Signed: Bethel Born, ED Scribe. 09/28/2015. 4:16 AM   No chief complaint on file.  The history is provided by the EMS personnel. No language interpreter was used.   Brought in by EMS post-arrest, Eddie [name] is a 65 y.o. male with no significant medical history who presents to the Emergency Department for evaluation after cardiac arrest. Pt called EMS for SOB and was initially being brought in as a Code STEMI before arresting in the ambulance bay. EMS noted PEA and pt was given 1 round of epinephrine PTA. He was not given aspirin PTA.   Upon arrival to the ambulance bay, pulse check with good pulse. Tachycardia. Blood pressure 163/116. Is taking breaths independently but minimally responsive.  No past medical history on file. No past surgical history on file. No family history on file. Social History  Substance Use Topics  . Smoking status: Not on file  . Smokeless tobacco: Not on file  . Alcohol Use: Not on file    Review of Systems  Unable to perform ROS: Acuity of condition   Allergies  Review of patient's allergies indicates not on file.  Home Medications   Prior to Admission medications   Not on File   BP 190/125 mmHg  Pulse 141  Resp 49  SpO2 100% Physical Exam  Constitutional:  Appears older than stated age, unresponsive, agonal breathing  HENT:  Head: Normocephalic and atraumatic.  Eyes:  Pupils 4 mm and minimally reactive bilaterally  Cardiovascular: Normal heart sounds.   No murmur heard. Tachycardia, irregular rhythm  Pulmonary/Chest: He has no wheezes. He has rales.  frothy sputum noted at the mouth, agonal respirations  Abdominal: Soft. He exhibits no distension.  Musculoskeletal: He exhibits no edema.   Neurological:  GCS 3  Skin: Skin is warm and dry.  Psychiatric: He has a normal mood and affect.  Nursing note and vitals reviewed.   ED Course  Procedures (including critical care time)  CRITICAL CARE Performed by: Shon Baton   Total critical care time: 30 minutes  Critical care time was exclusive of separately billable procedures and treating other patients.  Critical care was necessary to treat or prevent imminent or life-threatening deterioration.  Critical care was time spent personally by me on the following activities: development of treatment plan with patient and/or surrogate as well as nursing, discussions with consultants, evaluation of patient's response to treatment, examination of patient, obtaining history from patient or surrogate, ordering and performing treatments and interventions, ordering and review of laboratory studies, ordering and review of radiographic studies, pulse oximetry and re-evaluation of patient's condition.   COORDINATION OF CARE: 4:14 AM Dr. Antoine Poche (Cardiology) is at the bedside. Recommends Heparin.   INTUBATION Performed [id number] F Denya Buckingham, MD Required items: required blood products, implants, devices, and special equipment available Patient identity confirmed: provided demographic data and hospital-assigned identification number Time out: Immediately prior to procedure a "time out" was called to verify the correct patient, procedure, equipment, support staff and site/side marked as required.  Indications: arrest  Intubation method: Glidescope Laryngoscopy   Preoxygenation: BVM  Sedatives: 20 mg Etomidate Paralytic: 100 mg Rocuronium  Tube Size: 7.5 cuffed  Post-procedure assessment: chest rise and ETCO2 monitor Breath sounds: equal and absent over the epigastrium Tube  secured with: ETT holder Chest x-ray interpreted by radiologist and me.  Chest x-ray findings: endotracheal tube in appropriate position  Patient  tolerated the procedure well with no immediate complications.    Labs Review Labs Reviewed  APTT  CBC  COMPREHENSIVE METABOLIC PANEL  PROTIME-INR  I-STAT TROPOININ, ED  I-STAT TROPOININ, ED  I-STAT TROPOININ, ED    Imaging Review No results found. I have personally reviewed and evaluated these images and lab results as part of my medical decision-making.   EKG Interpretation   Date/Time:  Tuesday September 28 2015 04:09:51 EST Ventricular Rate:  191 PR Interval:    QRS Duration: 110 QT Interval:  249 QTC Calculation: 444 R Axis:   25 Text Interpretation:  Atrial fibrillation with rapid V-rate Incomplete  left bundle branch block Inferior infarct, acute (RCA) Probable RV  involvement, suggest recording right precordial leads ** ** ACUTE MI /  STEMI ** ** Confirmed by Neftali Thurow  MD, Audiel Scheiber ([id number]) on 09/28/2015 4:23:17  AM      MDM   Final diagnoses:  ST elevation myocardial infarction (STEMI) involving other coronary artery [facility])  Cardiac arrest [facility])    She presents as a code STEMI. He coded just prior to arrival. Received 1 mg of epinephrine. Upon arrival to the bay he had return of spontaneous circulations. [name] but does have agonal respirations. Repeat EKG concerning for acute ST elevation MI. He also has evidence of atrial fibrillation versus flutter. No reported history. He was given heparin and aspirin. He was intubated. Dr. Antoine Poche is at the bedside.  Patient prepped for the Cath Lab.    I personally performed the services described in this documentation, which was scribed in my presence. The recorded information has been reviewed and is accurate.    Shon Baton, MD 09/28/15 [phone]

## 2015-09-28 NOTE — Consult Note (Signed)
PULMONARY / CRITICAL CARE MEDICINE   Name: Eddie [name] MRN: [id number] DOB: 05/25/1961    ADMISSION DATE:  09/28/2015 CONSULTATION DATE:  09/28/15  REFERRING MD:  Antoine Poche  CHIEF COMPLAINT:  Cardiac Arrest  HISTORY OF PRESENT ILLNESS:  Pt is encephelopathic; therefore, this HPI is obtained from chart review. Eddie [name] is a 65 y.o. male with PMH of PUD.  During early AM hours 03/06, EMS was dispatched for SOB and chest pain.  EKG via EMS revealed STEMI.  He was being transported to [facility] ED and upon arrival in ambulance bay, he went unresponsive and staff noted him to be in PEA. He was transferred to ED room where ACLS was continued and he was intubated.  He received 1mg  epinephrine and roughly 6 minutes of ACLS prior to ROSC.   Post ROSC, he was in a.flutter with hypertension.  He was taken to the cath lab emergently and PCCM was called for consideration of hypothermia protocol following cardiac cath.   PAST MEDICAL HISTORY :  He  has a past medical history of PUD (peptic ulcer disease).  (-) DM,CAD, CVA. Was "healthy" according to wife  PAST SURGICAL HISTORY: He  has past surgical history that includes Repair of Peptic Ulcer.  Allergies not on file  No current facility-administered medications on file prior to encounter.   No current outpatient prescriptions on file prior to encounter.    FAMILY HISTORY:  His has no family status information on file.   DM and CAD in mother side. (-) h/o cancer.  SOCIAL HISTORY: He  is married. He has 2 children. Smokes 2-5 cigs/day since mid teens. (-) ETOH.  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   REVIEW OF SYSTEMS:  Unable to obtain as pt is encephalopathic. According to wife, pt with chronic on and off cp and cough and SOB. (-) recent illness. (-) fevers, chills, abd pain, edema. Rest of ROS was (-).   SUBJECTIVE:  On vent, non-responsive.  VITAL SIGNS: BP 190/125 mmHg  Pulse 141  Resp 49  SpO2 99%  HEMODYNAMICS:    VENTILATOR  SETTINGS: Vent Mode:  [-] PRVC FiO2 (%):  [100 %] 100 % Set Rate:  [20 bmp] 20 bmp Vt Set:  [500 mL] 500 mL PEEP:  [5 cmH20] 5 cmH20  INTAKE / OUTPUT:     PHYSICAL EXAMINATION: General: Adult AA male, non-responsive. Neuro: Sedated.  Per report, non-responsive prior to initiation of sedation. HEENT: [place]/AT. L pupil was 2 mm, R pupil was 3 mm. Non responsive.  Cardiovascular: RRR, no M/R/G.  Lungs: Respirations even and unlabored.  Rhonchi B.  Abdomen: BS x 4, soft, NT/ND.  Musculoskeletal: No gross deformities, no edema.  Skin: Intact, warm, no rashes.   LABS:  BMET  Recent Labs Lab 09/28/15 0410 09/28/15 0444  NA 143 145  K 4.0 2.8*  CL 105 107  CO2 19*  --   BUN 16 19  CREATININE 1.29* 1.10  GLUCOSE 350* 414*    Electrolytes  Recent Labs Lab 09/28/15 0410  CALCIUM 9.1    CBC  Recent Labs Lab 09/28/15 0410 09/28/15 0444  WBC 10.3  --   HGB 12.5* 13.6  HCT 42.2 40.0  PLT 168  --     Coag's  Recent Labs Lab 09/28/15 0410  APTT 23*  INR 1.17    Sepsis Markers No results for input(s): LATICACIDVEN, PROCALCITON, O2SATVEN in the last 168 hours.  ABG  Recent Labs Lab 09/28/15 0444 09/28/15 0512  PHART  6.966* 7.198*  PCO2ART 83.7* 63.1*  PO2ART 179.0* 274.0*    Liver Enzymes  Recent Labs Lab 09/28/15 0410  AST 26  ALT 14*  ALKPHOS 133*  BILITOT 1.0  ALBUMIN 3.1*    Cardiac Enzymes No results for input(s): TROPONINI, PROBNP in the last 168 hours.  Glucose No results for input(s): GLUCAP in the last 168 hours.  Imaging Dg Chest Port 1 View  09/28/2015  CLINICAL DATA:  Post CPR.  Code STEMI. EXAM: PORTABLE CHEST 1 VIEW COMPARISON:  None. FINDINGS: Endotracheal tube with tip measuring 4.2 cm above the carina. Enteric tube tip is off the field of view but below the left hemidiaphragm. Borderline heart size with normal pulmonary vascularity. Bilateral diffuse parenchymal infiltrates centered in the perihilar regions. This could  represent fluid overload, pneumonia, or aspiration. Visualized bones appear intact. IMPRESSION: Appliances appear in satisfactory location. Diffuse bilateral parenchymal infiltrates could indicate edema, pneumonia, or aspiration. Electronically Signed   By: Burman Nieves M.D.   On: 09/28/2015 04:39     STUDIES:  Cardiac Cath 03/07 > 100% LAD occlusion > DES placed. CXR 03/07 > pulm edema  CULTURES: None.  ANTIBIOTICS: None.  SIGNIFICANT EVENTS: 03/07 > PEA arrest > taken to cath lab then returned to ICU on vent  > hypothermia started   LINES/TUBES: ETT 03/07 > CVL pending 03/07 > A line pending 03/07 >  DISCUSSION: 65 y.o. M with PMH of PUD.  Was in USOH until early AM hours 03/06 when he developed SOB and chest pain.  Found to have STEMI and upon arrival to ED, had 6 minute cardiac arrest.  Taken to cath lab emergently (had DES to LAD which was 100% occluded) and PCCM called for consideration hypothermia protocol.  ASSESSMENT / PLAN:  CARDIOVASCULAR A: ASSESSMENT / PLAN:  CARDIOVASCULAR A:  Acute anterior STEMI - s/p emergent cardiac cath 03/07. P:  Initiate hypothermia protocol. Levophed as needed to maintain goal MAP > 80 during hypothermia. Assess CVP's. Trend lactate. Echo. Cardiology following, appreciate the assistance.  PULMONARY A: VDRF - due to inability to protect airway in the setting of PEA arrest. Respiratory acidosis. P:   Full vent support. Wean as able. VAP prevention measures. Hold SBT until rewarmed. Albuterol PRN. CXR in AM.  RENAL A:   Hypokalemia. Anticipate multiple electrolyte derangements during hypothermia. P:   K per tube now. BMP q2hrs x 5. Correct electrolytes as indicated.  GASTROINTESTINAL A:   Hx PUD. GI prophylaxis. Nutrition. P:   SUP: Pantoprazole. NPO.  HEMATOLOGIC A:   VTE Prophylaxis. P:  SCD's / Heparin. Coags q8hrs. CBC in AM.  INFECTIOUS A:   No indication of infection. P:   Monitor  clinically.  ENDOCRINE A:   Hyperglycemia - no hx of DM.  Anticipate worsening during hypothermia.   P:   ICU hyperglycemia protocol. Assess Hgb A1c.  NEUROLOGIC A:   Acute metabolic encephalopathy. P:   Sedation:  Midazolam gtt / Fentanyl gtt / Cisatracurium gtt. RASS goal:  -5 during hypothermia. Hold WUA until off paralytics. EEG. Day team to please consult neurology.   Family updated: None.   Acute anterior STEMI - s/p emergent cardiac cath 03/07. P:  Initiate hypothermia protocol. Levophed as needed to maintain goal MAP > 80 during hypothermia. Assess CVP's. Trend lactate. Echo. Cardiology following, appreciate the assistance.  PULMONARY A: VDRF - due to inability to protect airway in the setting of PEA arrest. Respiratory acidosis. P:   Full vent support. Wean as able.  VAP prevention measures. Hold SBT until rewarmed. Albuterol PRN. CXR in AM.  RENAL A:   Hypokalemia. Anticipate multiple electrolyte derangements during hypothermia. P:   K per tube now. BMP q2hrs x 5. Correct electrolytes as indicated.  GASTROINTESTINAL A:   Hx PUD. GI prophylaxis. Nutrition. P:   SUP: Pantoprazole. NPO.  HEMATOLOGIC A:   VTE Prophylaxis. P:  SCD's / Heparin. Coags q8hrs. CBC in AM.  INFECTIOUS A:   No indication of infection. P:   Monitor clinically.  ENDOCRINE A:   Hyperglycemia - no hx of DM.  Anticipate worsening during hypothermia.   P:   ICU hyperglycemia protocol. Assess Hgb A1c.  NEUROLOGIC A:   Acute metabolic encephalopathy. P:   Sedation:  Midazolam gtt / Fentanyl gtt / Cisatracurium gtt. RASS goal:  -5 during hypothermia. Hold WUA until off paralytics. EEG. Day team to please consult neurology.  Interdisciplinary Family Meeting v Palliative Care Meeting:  Due by: 03/13.  CC time: 45 minutes.   Rutherford Guys, PA - [facility] Pager: [phone]  or [phone] 09/28/2015,  5:55 AM    ATTENDING NOTE: I have personally reviewed patient's available data, including medical history, events of note, physical examination and test results as part of my evaluation. I have discussed with resident/NP Rutherford Guys.  65 y.o. year old male, admitted for acute SOB, cp. Found to have STEMI. Pt went into arrest at the bay area in ER. Revived post 6-8 minutes. LHC with 100% occlusion of LAD and DES placed. Transferred to ICU post stent. PCCM consulted. On transfer to ICU, pt was not doing anything and was already hypothermic (33.8C).    On exam, non resposive, sedated. 160/90, 80, 30, 93.93F. (-) NVD. Bilateral coarse crackles. Good s1/s2. Dec BS, soft. (-) masees. (-) edema. Cool extremities. Rest of exam per Rahul.    Labs reviewed. Pertinent labs include the following: ABG 7.35/53/328 on 100%. K 2.8. WBC 10. Trop 0.42.    Imaging reviewed personally.  CXR with pulm edema/B infiltrates.    Impression- Acute Anterior STEMI, S/P LHC, S/P DES (LAD 100% occluded) S/P Cardiac Arrest 2/2 STEMI (approx 6-8 min) Acute Hypercapneic Hypoxemic Resp Fx 2/2 STEMI, Unable to protect Airway, Likely with Pulm edema. Doubt aspiration pna.  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 Continue ventilatory support for now. Not ready for weaning. Try to decrease FiO2 to keep sats more than 90%. Patient is breathing over the vent. 2. Initiate hypothermia protocol. 3. Send for sputum culture and MRSA. Doubt he has active infection. Chest x-ray likely from pulmonary edema. 4. Potassium has just been replaced. We'll need Lasix for pulmonary edema. Need to make sure electrolytes are corrected. 5. On Versed and fentanyl drip and Nimbex 6. Keep nothing by mouth 7. Holding off on antibiotics. 8. Continue other medicines. Levophed to keep MAP acceptable while on hypothermia protocol.  9. DVT prophylaxis. 10. May need a cranial CT scan or MRI if sensorium does not improve on rewarming.  Code status : Full code.    I spoke and updated wife, [name], regarding patient's condition and is overall critical state and overall prognosis. Wife understands the critical nature of pt's illness. We need to assess neurologic status once he is rewarmed and off sedatives.  Mentioned there might be neurologic injury after the arrest.    Critical Care Time devoted to patient care services described in this note independent of APP time is 30 minutes.  Pollie Meyer, MD 09/28/2015, 7:03 AM [facility] Pager [phone] After 3 pm or if no answer, call [phone]

## 2015-09-28 NOTE — Procedures (Signed)
Central Venous Catheter Insertion Procedure Note Linzie Brunsvold [id number] 05/25/1961  Procedure: Insertion of Central Venous Catheter Indications: Assessment of intravascular volume, Drug and/or fluid administration and Frequent blood sampling  Procedure Details Consent: Unable to obtain consent because of altered level of consciousness. Time Out: Verified patient identification, verified procedure, site/side was marked, verified correct patient position, special equipment/implants available, medications/allergies/relevent history reviewed, required imaging and test results available.  Performed  Maximum sterile technique was used including antiseptics, cap, gloves, gown, hand hygiene, mask and sheet. Skin prep: Chlorhexidine; local anesthetic administered A antimicrobial bonded/coated triple lumen catheter was placed in the left internal jugular vein using the Seldinger technique.  Evaluation Blood flow good Complications: No apparent complications Patient did tolerate procedure well. Chest X-ray ordered to verify placement.  CXR: pending.  Procedure performed under direct ultrasound guidance for real time vessel cannulation.      Rutherford Guys, [place] [facility] Pager: [phone]  or [phone] 09/28/2015, 6:19 AM

## 2015-09-28 NOTE — Progress Notes (Signed)
PCCM progress note:  Critical care re rounds Pt is stable. On hypothermia protocol. Did not need to go on pressors. On deep sedation and paralytics. K repleted Flexiseal order placed for large BMs.  Continue current plan.  Additional critical care time: 20 mins,  Chilton Greathouse MD [facility] Pager [phone] If no answer or after 3pm call: [phone] 09/28/2015, 11:56 AM

## 2015-09-28 NOTE — Procedures (Signed)
Arterial Catheter Insertion Procedure Note Dantae Leisher [id number] 05/25/1961  Procedure: Insertion of Arterial Catheter  Indications: Blood pressure monitoring  Procedure Details Consent: Risks of procedure as well as the alternatives and risks of each were explained to the (patient/caregiver).  Consent for procedure obtained. Time Out: Verified patient identification, verified procedure, site/side was marked, verified correct patient position, special equipment/implants available, medications/allergies/relevent history reviewed, required imaging and test results available.  Performed  Maximum sterile technique was used including antiseptics, cap, gloves, gown, hand hygiene, mask and sheet. Skin prep: Chlorhexidine; local anesthetic administered 20 gauge catheter was inserted into left radial artery using the Seldinger technique.  Evaluation Blood flow good; BP tracing good. Complications: No apparent complications.   Sharene Skeans [facility] 09/28/2015

## 2015-09-28 NOTE — Progress Notes (Signed)
Pt transported to [facility] 08 from cath lab without event.

## 2015-09-29 LAB — BASIC METABOLIC PANEL
ANION GAP: 10 (ref 5–15)
ANION GAP: 11 (ref 5–15)
ANION GAP: 12 (ref 5–15)
Anion gap: 9 (ref 5–15)
Anion gap: 8 (ref 5–15)
Anion gap: 8 (ref 5–15)
BUN: 14 mg/dL (ref 6–20)
BUN: 15 mg/dL (ref 6–20)
BUN: 15 mg/dL (ref 6–20)
BUN: 15 mg/dL (ref 6–20)
BUN: 15 mg/dL (ref 6–20)
BUN: 17 mg/dL (ref 6–20)
CALCIUM: 8.6 mg/dL — AB (ref 8.9–10.3)
CALCIUM: 8.5 mg/dL — AB (ref 8.9–10.3)
CALCIUM: 8.5 mg/dL — AB (ref 8.9–10.3)
CALCIUM: 8.5 mg/dL — AB (ref 8.9–10.3)
CHLORIDE: 115 mmol/L — AB (ref 101–111)
CHLORIDE: 117 mmol/L — AB (ref 101–111)
CHLORIDE: 115 mmol/L — AB (ref 101–111)
CO2: 20 mmol/L — ABNORMAL LOW (ref 22–32)
CO2: 20 mmol/L — ABNORMAL LOW (ref 22–32)
CO2: 21 mmol/L — AB (ref 22–32)
CO2: 21 mmol/L — AB (ref 22–32)
CO2: 21 mmol/L — AB (ref 22–32)
CO2: 21 mmol/L — ABNORMAL LOW (ref 22–32)
CREATININE: 0.69 mg/dL (ref 0.61–1.24)
CREATININE: 0.7 mg/dL (ref 0.61–1.24)
CREATININE: 1.02 mg/dL (ref 0.61–1.24)
Calcium: 8.7 mg/dL — ABNORMAL LOW (ref 8.9–10.3)
Calcium: 8.7 mg/dL — ABNORMAL LOW (ref 8.9–10.3)
Chloride: 113 mmol/L — ABNORMAL HIGH (ref 101–111)
Chloride: 114 mmol/L — ABNORMAL HIGH (ref 101–111)
Chloride: 117 mmol/L — ABNORMAL HIGH (ref 101–111)
Creatinine, Ser: 0.71 mg/dL (ref 0.61–1.24)
Creatinine, Ser: 0.75 mg/dL (ref 0.61–1.24)
Creatinine, Ser: 0.85 mg/dL (ref 0.61–1.24)
GFR calc Af Amer: >60 mL/min (ref >60)
GFR calc Af Amer: >60 mL/min (ref >60)
GFR calc non Af Amer: >60 mL/min (ref >60)
GFR calc non Af Amer: >60 mL/min (ref >60)
GFR calc non Af Amer: >60 mL/min (ref >60)
GLUCOSE: 109 mg/dL — AB (ref 65–99)
GLUCOSE: 115 mg/dL — AB (ref 65–99)
GLUCOSE: 164 mg/dL — AB (ref 65–99)
GLUCOSE: 106 mg/dL — AB (ref 65–99)
Glucose, Bld: 113 mg/dL — ABNORMAL HIGH (ref 65–99)
Glucose, Bld: 112 mg/dL — ABNORMAL HIGH (ref 65–99)
POTASSIUM: 3.9 mmol/L (ref 3.5–5.1)
POTASSIUM: 3.4 mmol/L — AB (ref 3.5–5.1)
POTASSIUM: 4.1 mmol/L (ref 3.5–5.1)
Potassium: 3.5 mmol/L (ref 3.5–5.1)
Potassium: 3.1 mmol/L — ABNORMAL LOW (ref 3.5–5.1)
Potassium: 3.2 mmol/L — ABNORMAL LOW (ref 3.5–5.1)
SODIUM: 145 mmol/L (ref 135–145)
SODIUM: 146 mmol/L — AB (ref 135–145)
SODIUM: 146 mmol/L — AB (ref 135–145)
Sodium: 146 mmol/L — ABNORMAL HIGH (ref 135–145)
Sodium: 144 mmol/L (ref 135–145)
Sodium: 146 mmol/L — ABNORMAL HIGH (ref 135–145)

## 2015-09-29 LAB — GLUCOSE, CAPILLARY
GLUCOSE-CAPILLARY: 100 mg/dL — AB (ref 65–99)
Glucose-Capillary: 136 mg/dL — ABNORMAL HIGH (ref 65–99)
Glucose-Capillary: 108 mg/dL — ABNORMAL HIGH (ref 65–99)
Glucose-Capillary: 101 mg/dL — ABNORMAL HIGH (ref 65–99)
Glucose-Capillary: 92 mg/dL (ref 65–99)

## 2015-09-29 LAB — CBC
HEMATOCRIT: 33.4 % — AB (ref 39.0–52.0)
Hemoglobin: 11 g/dL — ABNORMAL LOW (ref 13.0–17.0)
MCH: 24.9 pg — ABNORMAL LOW (ref 26.0–34.0)
MCHC: 32.9 g/dL (ref 30.0–36.0)
MCV: 75.7 fL — AB (ref 78.0–100.0)
PLATELETS: 210 10*3/uL (ref 150–400)
RBC: 4.41 MIL/uL (ref 4.22–5.81)
RDW: 12.7 % (ref 11.5–15.5)
WBC: 6.5 10*3/uL (ref 4.0–10.5)

## 2015-09-29 LAB — HEMOGLOBIN A1C
HEMOGLOBIN A1C: 7.5 % — AB (ref 4.8–5.6)
MEAN PLASMA GLUCOSE: 169 mg/dL

## 2015-09-29 LAB — PHOSPHORUS: Phosphorus: 1.7 mg/dL — ABNORMAL LOW (ref 2.5–4.6)

## 2015-09-29 LAB — MAGNESIUM: Magnesium: 1.6 mg/dL — ABNORMAL LOW (ref 1.7–2.4)

## 2015-09-29 MED ORDER — SODIUM CHLORIDE 0.9 % IV SOLN
25.0000 ug/h | INTRAVENOUS | Status: DC
  Administered 2015-09-30: 50 ug/h via INTRAVENOUS
  Administered 2015-10-02: 325 ug/h via INTRAVENOUS
  Administered 2015-10-02: 300 ug/h via INTRAVENOUS
  Administered 2015-10-02: 200 ug/h via INTRAVENOUS
  Administered 2015-10-03: 325 ug/h via INTRAVENOUS
  Administered 2015-10-03: 375 ug/h via INTRAVENOUS
  Administered 2015-10-03 – 2015-10-04 (×2): 325 ug/h via INTRAVENOUS
  Administered 2015-10-04: 400 ug/h via INTRAVENOUS
  Administered 2015-10-05: 200 ug/h via INTRAVENOUS
  Administered 2015-10-05: 400 ug/h via INTRAVENOUS
  Administered 2015-10-05: 200 ug/h via INTRAVENOUS
  Administered 2015-10-06: 250 ug/h via INTRAVENOUS
  Administered 2015-10-06: 300 ug/h via INTRAVENOUS
  Administered 2015-10-07: 150 ug/h via INTRAVENOUS
  Administered 2015-10-07: 250 ug/h via INTRAVENOUS
  Administered 2015-10-08 – 2015-10-09 (×2): 150 ug/h via INTRAVENOUS
  Administered 2015-10-11 (×2): 225 ug/h via INTRAVENOUS
  Administered 2015-10-12: 275 ug/h via INTRAVENOUS
  Filled 2015-09-29 (×27): qty 50

## 2015-09-29 MED ORDER — FENTANYL BOLUS VIA INFUSION
50.0000 ug | INTRAVENOUS | Status: DC | PRN
  Administered 2015-10-01 – 2015-10-11 (×20): 50 ug via INTRAVENOUS
  Filled 2015-09-29: qty 50

## 2015-09-29 MED ORDER — MIDAZOLAM HCL 2 MG/2ML IJ SOLN
2.0000 mg | INTRAMUSCULAR | Status: DC | PRN
  Administered 2015-10-01 – 2015-10-06 (×35): 2 mg via INTRAVENOUS
  Filled 2015-09-29 (×37): qty 2

## 2015-09-29 MED ORDER — FENTANYL CITRATE (PF) 100 MCG/2ML IJ SOLN: 50.0000 ug | Freq: Once | INTRAMUSCULAR | Status: DC

## 2015-09-29 MED ORDER — POTASSIUM CHLORIDE 10 MEQ/50ML IV SOLN
10.0000 meq | INTRAVENOUS | Status: AC
  Administered 2015-09-29 (×2): 10 meq via INTRAVENOUS
  Filled 2015-09-29 (×2): qty 50

## 2015-09-29 MED ORDER — MAGNESIUM SULFATE 2 GM/50ML IV SOLN
2.0000 g | Freq: Once | INTRAVENOUS | Status: AC
  Administered 2015-09-29: 2 g via INTRAVENOUS
  Filled 2015-09-29: qty 50

## 2015-09-29 MED ORDER — MIDAZOLAM HCL 2 MG/2ML IJ SOLN
2.0000 mg | INTRAMUSCULAR | Status: AC | PRN
  Administered 2015-09-30 (×3): 2 mg via INTRAVENOUS
  Filled 2015-09-29 (×3): qty 2

## 2015-09-29 MED ORDER — INSULIN GLARGINE 100 UNIT/ML ~~LOC~~ SOLN
5.0000 [IU] | Freq: Every day | SUBCUTANEOUS | Status: DC
  Administered 2015-09-29 – 2015-10-06 (×7): 5 [IU] via SUBCUTANEOUS
  Filled 2015-09-29 (×12): qty 0.05

## 2015-09-29 MED ORDER — POTASSIUM CHLORIDE 20 MEQ/15ML (10%) PO SOLN
40.0000 meq | Freq: Once | ORAL | Status: AC
  Administered 2015-09-29: 40 meq
  Filled 2015-09-29: qty 30

## 2015-09-29 NOTE — Progress Notes (Signed)
Paged Dr. Isaiah Serge about K of 3.2. Pt continues to be rewarmed. Awaiting new orders.

## 2015-09-29 NOTE — Progress Notes (Signed)
    Subjective:  Intubated, sedated, cooled   Objective:  Filed Vitals:   09/29/15 0500 09/29/15 0600 09/29/15 0700 09/29/15 0710  BP:    119/79  Pulse: 66 64 64 64  Temp: 91.4 F (33 C) 91.4 F (33 C) 91.4 F (33 C)   TempSrc: Core (Comment) Core (Comment) Core (Comment)   Resp:    26  Height:      Weight:      SpO2: 100% 100% 100% 100%    Intake/Output from previous day:  Intake/Output Summary (Last 24 hours) at 09/29/15 0713 Last data filed at 09/29/15 0700  Gross per 24 hour  Intake 1372.66 ml  Output   2645 ml  Net -1272.34 ml    Physical Exam: Physical exam: Well-developed well-nourished intubated and sedated  Skin is warm and dry.  HEENT is normal.  Neck is supple.  Chest with diffuse rhonchi Cardiovascular exam is regular rate and rhythm.  Abdominal exam not distended. No masses palpated. Extremities show no edema. Right groin with no hematoma and no bruit neuro not assessed due to sedation    Lab Results: Basic Metabolic Panel:  Recent Labs  38/10/17 0620  09/29/15 0015 09/29/15 0500  NA 146*  < > 146* 145  K 3.7  < > 3.4* 3.5  CL 110  < > 113* 115*  CO2 24  < > 21* 21*  GLUCOSE 304*  < > 164* 115*  BUN 15  < > 15 15  CREATININE 1.00  < > 0.71 0.69  CALCIUM 7.0*  < > 8.7* 8.6*  MG 1.7  --  1.6*  --   PHOS  --   --  1.7*  --   < > = values in this interval not displayed. CBC:  Recent Labs  09/28/15 0410  09/28/15 1103 09/28/15 1249  WBC 10.3  --   --   --   HGB 12.5*  < > 13.3 13.3  HCT 42.2  < > 39.0 39.0  MCV 81.5  --   --   --   PLT 168  --   --   --   < > = values in this interval not displayed. Cardiac Enzymes:  Recent Labs  09/28/15 0645 09/28/15 1240 09/28/15 1700  TROPONINI 30.84* 60.50* >65.00*     Assessment/Plan:  1 status post anterior myocardial infarction-patient is status post PCI of LAD. Continue aspirin and brilinta. Echocardiogram shows severely reduced LV function. Will add beta blocker and ACE  inhibitor when blood pressure allows. Add statin when extubated. Patient will require life vest at DC. Repeat echocardiogram in 3 months. If ejection fraction less than 35% would require ICD. Diffuse pulmonary infiltrates noted on chest x-ray. Will likely require gentle diuresis. Note a.m. Electrocardiogram shows persistent anterior ST elevation and markedly prolonged QT interval. ST elevation is unchanged compared to electrocardiogram immediately following intervention. 2 status post cardiac arrest-patient is being cooled. Critical care managing. 3 VDRF-Vent management per CCM. 4 Hypoxic encephalopathy-well need to reassess neurological status after warmed. May need neurology consult if doesn't improve.  Olga Millers 09/29/2015, 7:13 AM   \

## 2015-09-29 NOTE — Progress Notes (Signed)
PULMONARY / CRITICAL CARE MEDICINE   Name: Eddie [name] MRN: [id number] DOB: 1951/02/26    ADMISSION DATE:  09/28/2015 CONSULTATION DATE:  09/28/15  REFERRING MD:  Antoine Poche  CHIEF COMPLAINT:  Cardiac Arrest  HISTORY OF PRESENT ILLNESS:  Pt is encephelopathic; therefore, this HPI is obtained from chart review. Eddie [name] is a 65 y.o. male with PMH of PUD.  During early AM hours 03/06, EMS was dispatched for SOB and chest pain.  EKG via EMS revealed STEMI.  He was being transported to [facility] ED and upon arrival in ambulance bay, he went unresponsive and staff noted him to be in PEA. He was transferred to ED room where ACLS was continued and he was intubated.  He received 1mg  epinephrine and roughly 6 minutes of ACLS prior to ROSC.   Post ROSC, he was in a.flutter with hypertension.  He was taken to the cath lab emergently and PCCM was called for consideration of hypothermia protocol following cardiac cath.   PAST MEDICAL HISTORY :  He  has a past medical history of PUD (peptic ulcer disease).  (-) DM,CAD, CVA. Was "healthy" according to wife  PAST SURGICAL HISTORY: He  has past surgical history that includes Repair of Peptic Ulcer and Cardiac catheterization (N/A, 09/28/2015).  No Known Allergies  No current facility-administered medications on file prior to encounter.   No current outpatient prescriptions on file prior to encounter.    FAMILY HISTORY:  His has no family status information on file.   DM and CAD in mother side. (-) h/o cancer.  SOCIAL HISTORY: He  reports that he has been smoking.  He does not have any smokeless tobacco history on file.is married. He has 2 children. Smokes 2-5 cigs/day since mid teens. (-) ETOH.  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   REVIEW OF SYSTEMS:  Unable to obtain as pt is encephalopathic. According to wife, pt with chronic on and off cp and cough and SOB. (-) recent illness. (-) fevers, chills, abd pain, edema. Rest of ROS was (-).    SUBJECTIVE:  Started rewarming at 8 am.  VITAL SIGNS: BP 135/83 mmHg  Pulse 65  Temp(Src) 91.4 F (33 C) (Temporal)  Resp 26  Ht 5\' 7"  (1.702 m)  Wt 148 lb 2.4 oz (67.2 kg)  BMI 23.20 kg/m2  SpO2 100%  HEMODYNAMICS: CVP:  [4 mmHg-10 mmHg] 5 mmHg  VENTILATOR SETTINGS: Vent Mode:  [-] PRVC FiO2 (%):  [40 %] 40 % Set Rate:  [28 bmp] 28 bmp Vt Set:  [550 mL] 550 mL PEEP:  [5 cmH20] 5 cmH20 Plateau Pressure:  [20 cmH20-30 cmH20] 24 cmH20  INTAKE / OUTPUT: I/O last 3 completed shifts: In: 1427.6 [I.V.:1017.6; NG/GT:60; IV Piggyback:350] Out: 7353 [GDJME:2683; Emesis/NG output:750]   PHYSICAL EXAMINATION: General: AA male, No distress Neuro: sedated, unresponsive HEENT: Moist mucus membranes Cardiovascular: RRR, no M/R/G.  Lungs: Clear, no wheeze, crackles. Abdomen: Soft, + BS, NT, ND Musculoskeletal: No gross deformities, no edema.  Skin: Intact, warm, no rashes.   LABS:  BMET  Recent Labs Lab 09/29/15 0015 09/29/15 0500 09/29/15 0855  NA 146* 145 146*  K 3.4* 3.5 3.1*  CL 113* 115* 117*  CO2 21* 21* 21*  BUN 15 15 15   CREATININE 0.71 0.69 0.70  GLUCOSE 164* 115* 106*    Electrolytes  Recent Labs Lab 09/28/15 0620  09/29/15 0015 09/29/15 0500 09/29/15 0855  CALCIUM 7.0*  < > 8.7* 8.6* 8.5*  MG 1.7  --  1.6*  --   --  PHOS  --   --  1.7*  --   --   < > = values in this interval not displayed.  CBC  Recent Labs Lab 09/28/15 0410  09/28/15 1103 09/28/15 1249 09/29/15 0640  WBC 10.3  --   --   --  6.5  HGB 12.5*  < > 13.3 13.3 11.0*  HCT 42.2  < > 39.0 39.0 33.4*  PLT 168  --   --   --  210  < > = values in this interval not displayed.  Coag's  Recent Labs Lab 09/28/15 0410 09/28/15 1000 09/28/15 1245  APTT 23* 66* 43*  INR 1.17 2.21* 1.41    Sepsis Markers  Recent Labs Lab 09/28/15 0520 09/28/15 1032  LATICACIDVEN 3.4* 2.5*    ABG  Recent Labs Lab 09/28/15 0512 09/28/15 0556 09/28/15 0830  PHART 7.198* 7.356  7.425  PCO2ART 63.1* 53.3* 35.7  PO2ART 274.0* 328.0* 117*    Liver Enzymes  Recent Labs Lab 09/28/15 0410  AST 26  ALT 14*  ALKPHOS 133*  BILITOT 1.0  ALBUMIN 3.1*    Cardiac Enzymes  Recent Labs Lab 09/28/15 0645 09/28/15 1240 09/28/15 1700  TROPONINI 30.84* 60.50* >65.00*    Glucose  Recent Labs Lab 09/28/15 2011 09/28/15 2115 09/28/15 2212 09/28/15 2303 09/29/15 0314 09/29/15 0733  GLUCAP 175* 149* 169* 156* 136* 108*    Imaging No results found.   STUDIES:  Cardiac Cath 03/07 > 100% LAD occlusion > DES placed. CXR 03/07 > pulm edema  CULTURES: None.  ANTIBIOTICS: None.  SIGNIFICANT EVENTS: 03/07 > PEA arrest > taken to cath lab then returned to ICU on vent  > hypothermia started   LINES/TUBES: ETT 03/07 > CVL 03/07 > A line 03/07 >  DISCUSSION: 65 y.o. M with PMH of PUD.  Was in USOH until early AM hours 03/06 when he developed SOB and chest pain.  Found to have STEMI and upon arrival to ED, had 6 minute cardiac arrest.  Taken to cath lab emergently (had DES to LAD which was 100% occluded) and PCCM called for consideration hypothermia protocol.  ASSESSMENT / PLAN:  CARDIOVASCULAR A: ASSESSMENT / PLAN:  CARDIOVASCULAR A:  Acute anterior STEMI - s/p emergent cardiac cath 03/07. Trop > 65 Echo 10-15% P:  On hypothermia protocol. Levophed as needed to maintain goal MAP > 80 during hypothermia. Assess CVP's. Trend lactate.  PULMONARY A: VDRF - due to inability to protect airway in the setting of PEA arrest. Respiratory acidosis. P:   Full vent support. VAP prevention measures. Hold SBT until rewarmed. Albuterol PRN. CXR in AM.  RENAL A:   Hypokalemia. Anticipate multiple electrolyte derangements during hypothermia. P:   Correct electrolytes as indicated.  GASTROINTESTINAL A:   Hx PUD. GI prophylaxis. Nutrition. P:   SUP: Pantoprazole. NPO.  HEMATOLOGIC A:   VTE Prophylaxis. P:  SCD's / Heparin. Coags  q8hrs. CBC in AM.  INFECTIOUS A:   No indication of infection. P:   Monitor clinically.  ENDOCRINE A:   Hyperglycemia - no hx of DM.  Anticipate worsening during hypothermia.   P:   ICU hyperglycemia protocol. Assess Hgb A1c.  NEUROLOGIC A:   Acute metabolic encephalopathy. P:   Sedation:  Midazolam gtt / Fentanyl gtt / Cisatracurium gtt. RASS goal:  -5 during hypothermia. Hold WUA until off paralytics.  Family updated: None.   Acute anterior STEMI - s/p emergent cardiac cath 03/07. P:  Initiate hypothermia protocol. Levophed as needed to maintain  goal MAP > 80 during hypothermia. Assess CVP's. Trend lactate. Echo. Cardiology following, appreciate the assistance.  PULMONARY A: VDRF - due to inability to protect airway in the setting of PEA arrest. Respiratory acidosis. P:   Full vent support. Wean as able. VAP prevention measures. Hold SBT until rewarmed. Albuterol PRN. CXR in AM.  RENAL A:   Hypokalemia. Anticipate multiple electrolyte derangements during hypothermia. P:   K per tube now. BMP q2hrs x 5. Correct electrolytes as indicated.  GASTROINTESTINAL A:   Hx PUD. GI prophylaxis. Nutrition. P:   SUP: Pantoprazole. NPO.  HEMATOLOGIC A:   VTE Prophylaxis. P:  SCD's / Heparin. Coags q8hrs. CBC in AM.  INFECTIOUS A:   No indication of infection. P:   Monitor off antibiotics  ENDOCRINE A:   Hyperglycemia - no hx of DM.  Anticipate worsening during hypothermia.   P:   ICU hyperglycemia protocol. Assess Hgb A1c.  NEUROLOGIC A:   Acute metabolic encephalopathy. EEG with slowing of cerebral activity. P:   Sedation:  Midazolam gtt / Fentanyl gtt / Cisatracurium gtt. RASS goal:  -5 during hypothermia. Hold WUA until off paralytics.  Interdisciplinary Family Meeting v Palliative Care Meeting:  Due by: 03/13. CC time: 45 minutes.  Chilton Greathouse MD [facility] Pager [phone] If no answer or  after 3pm call: [phone] 09/29/2015, 10:00 AM

## 2015-09-29 NOTE — Progress Notes (Signed)
eLink Physician-Brief Progress Note Patient Name: Eddie [name] DOB: [birth year] MRN: [id number]   Date of Service  09/29/2015  HPI/Events of Note  Notified by bedside nurse of tight glucose control. Patient Transitioned off of insulin infusion yesterday evening to Lantus 10 units subcutaneous daily at bedtime & has required limited when necessary subcutaneous insulin for glucose coverage.  eICU Interventions  1. Discontinue insulin drip 2. Continue Accu-Cheks every 4 hours with sliding scale insulin coverage 3. Decrease Lantus to 5 units subcutaneous daily at bedtime     Intervention Category Intermediate Interventions: Hyperglycemia - evaluation and treatment  Lawanda Cousins 09/29/2015, 8:22 PM

## 2015-09-29 NOTE — Progress Notes (Signed)
EKG CRITICAL VALUE     12 lead EKG performed.  Critical value noted.  Juanell Fairly, RN notified.   Lelar Farewell C, CCT 09/29/2015 6:57 AM

## 2015-09-29 NOTE — Progress Notes (Signed)
eLink Physician-Brief Progress Note Patient Name: Eddie [name] DOB: 1950-08-05 MRN: [id number]   Date of Service  09/29/2015  HPI/Events of Note  K+ = 3.4, Mg++ = 1.6 and Creatinine = 0.71.  eICU Interventions  Will replete Mg++ and K+.     Intervention Category Intermediate Interventions: Electrolyte abnormality - evaluation and management  Lilou Kneip Eugene 09/29/2015, 1:25 AM

## 2015-09-29 NOTE — Clinical Documentation Improvement (Signed)
Cardiology Critical Care  Noted "pulmonary edema" documented, can an acuity be added for greater specificity.  Thank you    Acute Pulmonary Edema  Acute on Chronic Pulmonary Edema  Other  Clinically Undetermined  Supporting Information: IV Lasix given..MD note 09/28/15 5:24pm    Please exercise your independent, professional judgment when responding. A specific answer is not anticipated or expected.   Thank You, Lavonda Jumbo Health Information Management [place] [phone]

## 2015-09-30 ENCOUNTER — Inpatient Hospital Stay (HOSPITAL_COMMUNITY): Payer: BLUE CROSS/BLUE SHIELD

## 2015-09-30 LAB — POCT I-STAT 3, ART BLOOD GAS (G3+)
ACID-BASE DEFICIT: 1 mmol/L (ref 0.0–2.0)
Bicarbonate: 22.6 mEq/L (ref 20.0–24.0)
O2 SAT: 98 %
PH ART: 7.426 (ref 7.350–7.450)
TCO2: 24 mmol/L (ref 0–100)
pCO2 arterial: 34.2 mmHg — ABNORMAL LOW (ref 35.0–45.0)
pO2, Arterial: 104 mmHg — ABNORMAL HIGH (ref 80.0–100.0)

## 2015-09-30 LAB — BASIC METABOLIC PANEL
ANION GAP: 10 (ref 5–15)
BUN: 20 mg/dL (ref 6–20)
CHLORIDE: 116 mmol/L — AB (ref 101–111)
CO2: 21 mmol/L — ABNORMAL LOW (ref 22–32)
Calcium: 8.5 mg/dL — ABNORMAL LOW (ref 8.9–10.3)
Creatinine, Ser: 1.13 mg/dL (ref 0.61–1.24)
GFR calc non Af Amer: >60 mL/min (ref >60)
Glucose, Bld: 118 mg/dL — ABNORMAL HIGH (ref 65–99)
Potassium: 4.1 mmol/L (ref 3.5–5.1)
SODIUM: 147 mmol/L — AB (ref 135–145)

## 2015-09-30 LAB — CBC
HCT: 35.4 % — ABNORMAL LOW (ref 39.0–52.0)
HEMOGLOBIN: 11.5 g/dL — AB (ref 13.0–17.0)
MCH: 24.8 pg — ABNORMAL LOW (ref 26.0–34.0)
MCHC: 32.5 g/dL (ref 30.0–36.0)
MCV: 76.3 fL — ABNORMAL LOW (ref 78.0–100.0)
Platelets: 218 10*3/uL (ref 150–400)
RBC: 4.64 MIL/uL (ref 4.22–5.81)
RDW: 13.1 % (ref 11.5–15.5)
WBC: 9.9 10*3/uL (ref 4.0–10.5)

## 2015-09-30 LAB — GLUCOSE, CAPILLARY
GLUCOSE-CAPILLARY: 104 mg/dL — AB (ref 65–99)
GLUCOSE-CAPILLARY: 108 mg/dL — AB (ref 65–99)
GLUCOSE-CAPILLARY: 63 mg/dL — AB (ref 65–99)
GLUCOSE-CAPILLARY: 98 mg/dL (ref 65–99)
Glucose-Capillary: 103 mg/dL — ABNORMAL HIGH (ref 65–99)
Glucose-Capillary: 79 mg/dL (ref 65–99)

## 2015-09-30 LAB — MAGNESIUM: MAGNESIUM: 1.8 mg/dL (ref 1.7–2.4)

## 2015-09-30 LAB — PHOSPHORUS: PHOSPHORUS: 3.7 mg/dL (ref 2.5–4.6)

## 2015-09-30 MED ORDER — SODIUM CHLORIDE 0.9% FLUSH
10.0000 mL | INTRAVENOUS | Status: DC | PRN
  Administered 2015-10-12: 10 mL
  Filled 2015-09-30: qty 40

## 2015-09-30 MED ORDER — VITAL HIGH PROTEIN PO LIQD: 1000.0000 mL | ORAL | Status: DC

## 2015-09-30 MED ORDER — SODIUM CHLORIDE 0.9% FLUSH
10.0000 mL | Freq: Two times a day (BID) | INTRAVENOUS | Status: DC
  Administered 2015-09-30: 10 mL
  Administered 2015-10-01 (×2): 30 mL
  Administered 2015-10-02: 10 mL

## 2015-09-30 MED ORDER — VITAL AF 1.2 CAL PO LIQD
1000.0000 mL | ORAL | Status: DC
  Administered 2015-09-30 – 2015-10-03 (×5): 1000 mL

## 2015-09-30 MED ORDER — DEXTROSE 50 % IV SOLN
25.0000 mL | Freq: Once | INTRAVENOUS | Status: AC
  Administered 2015-09-30: 25 mL via INTRAVENOUS
  Filled 2015-09-30: qty 50

## 2015-09-30 MED ORDER — SODIUM CHLORIDE 0.9 % IV SOLN
2.0000 mg/h | INTRAVENOUS | Status: DC
  Administered 2015-10-01: 2 mg/h via INTRAVENOUS
  Filled 2015-09-30: qty 10

## 2015-09-30 MED FILL — Ticagrelor Tab 90 MG: ORAL | Qty: 1 | Status: AC

## 2015-09-30 MED FILL — Sodium Bicarbonate IV Soln 8.4%: INTRAVENOUS | Qty: 50 | Status: AC

## 2015-09-30 MED FILL — Fentanyl Citrate Preservative Free (PF) Inj 100 MCG/2ML: INTRAMUSCULAR | Qty: 2 | Status: AC

## 2015-09-30 MED FILL — Heparin Sodium (Porcine) 2 Unit/ML in Sodium Chloride 0.9%: INTRAMUSCULAR | Qty: 1000 | Status: AC

## 2015-09-30 MED FILL — Midazolam HCl Inj 2 MG/2ML (Base Equivalent): INTRAMUSCULAR | Qty: 2 | Status: AC

## 2015-09-30 NOTE — Progress Notes (Signed)
Initial Nutrition Assessment  DOCUMENTATION CODES:   Not applicable  INTERVENTION:  -RD to continue to monitor for needs  NUTRITION DIAGNOSIS:   Inadequate oral intake related to inability to eat as evidenced by NPO status.  GOAL:   Patient will meet greater than or equal to 90% of their needs  MONITOR:   Diet advancement, Vent status, Labs, I & O's  REASON FOR ASSESSMENT:   Ventilator    ASSESSMENT:   The patient presented with chest pain. He is intubated and unresponsive. The history comes from EMS staff and his wife. He apparently started to have chest pain about one hour ago. Intial EKG showed sinus tachycardia with ST elevation in V2 through V4. EMS was called. He became unresponsive shortly before arriving in the ED.  Patient is currently intubated on ventilator support MV: 15.5 L/min Temp (24hrs), Avg:96.7 F (35.9 C), Min:93.2 F (34 C), Max:99.3 F (37.4 C)  Propofol: None  Pt was in PEA upon arrival in ambulance bay. Was tx to ED where ACLS continued and he was intubated. He does open his eyes but has had no purposeful movements at this time, on hypothermia protocol.  Nutrition-Focused physical exam completed. Findings are no fat depletion, no muscle depletion, and no edema.    Labs: Na 147 Medications: Fentanyl Drip,   Diet Order:     Skin:  Reviewed, no issues  Last BM:  3/9  Height:   Ht Readings from Last 1 Encounters:  09/28/15 5\' 7"  (1.702 m)    Weight:   Wt Readings from Last 1 Encounters:  09/29/15 148 lb 2.4 oz (67.2 kg)    Ideal Body Weight:  67.27 kg  BMI:  Body mass index is 23.2 kg/(m^2).  Estimated Nutritional Needs:   Kcal:  1715  Protein:  80-100 grams  Fluid:  Per MD rec.  EDUCATION NEEDS:   No education needs identified at this time  Dionne Ano. George Alcantar, MS, RD LDN After Hours/Weekend Pager [phone]

## 2015-09-30 NOTE — Progress Notes (Signed)
PULMONARY / CRITICAL CARE MEDICINE   Name: Eddie [name] MRN: [id number] DOB: June 26, 1951    ADMISSION DATE:  09/28/2015 CONSULTATION DATE:  09/28/15  REFERRING MD:  Antoine Poche  CHIEF COMPLAINT:  Cardiac Arrest  HISTORY OF PRESENT ILLNESS:  Pt is encephelopathic; therefore, this HPI is obtained from chart review. Eddie [name] is a 65 y.o. male with PMH of PUD.  During early AM hours 03/06, EMS was dispatched for SOB and chest pain.  EKG via EMS revealed STEMI.  He was being transported to [facility] ED and upon arrival in ambulance bay, he went unresponsive and staff noted him to be in PEA. He was transferred to ED room where ACLS was continued and he was intubated.  He received  epinephrine and roughly 6 minutes of ACLS prior to ROSC.   Post ROSC, he was in a.flutter with hypertension.  He was taken to the cath lab emergently and PCCM was called for consideration of hypothermia protocol following cardiac cath.   PAST MEDICAL HISTORY :  He  has a past medical history of PUD (peptic ulcer disease).  (-) DM,CAD, CVA. Was "healthy" according to wife  PAST SURGICAL HISTORY: He  has past surgical history that includes Repair of Peptic Ulcer and Cardiac catheterization (N/A, 09/28/2015).  No Known Allergies  No current facility-administered medications on file prior to encounter.   No current outpatient prescriptions on file prior to encounter.    FAMILY HISTORY:  His has no family status information on file.   DM and CAD in mother side. (-) h/o cancer.  SOCIAL HISTORY: He  reports that he has been smoking.  He does not have any smokeless tobacco history on file.is married. He has 2 children. Smokes 2-5 cigs/day since mid teens. (-) ETOH.  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   REVIEW OF SYSTEMS:  Unable to obtain as pt is encephalopathic. According to wife, pt with chronic on and off cp and cough and SOB. (-) recent illness. (-) fevers, chills, abd pain, edema. Rest of ROS was (-).    SUBJECTIVE:  Rewarmed. Still with poor mental status.   VITAL SIGNS: BP 122/73 mmHg  Pulse 104  Temp(Src) 98.1 F (36.7 C) (Core (Comment))  Resp 28  Ht  (1.702 m)  Wt 148 lb 2.4 oz (67.2 kg)  BMI 23.20 kg/m2  SpO2 100%  HEMODYNAMICS: CVP:  [0 mmHg-9 mmHg] 7 mmHg  VENTILATOR SETTINGS: Vent Mode:  [-] PRVC FiO2 (%):  [40 %] 40 % Set Rate:  [28 bmp] 28 bmp Vt Set:  [50 mL-550 mL] 550 mL PEEP:  [5 cmH20] 5 cmH20 Plateau Pressure:  [25 cmH20-30 cmH20] 25 cmH20  INTAKE / OUTPUT: I/O last 3 completed shifts: In: 1718.6 [I.V.:1318.6; NG/GT:250; IV Piggyback:150] Out: 2280 [Urine:1380; Emesis/NG output:900]   PHYSICAL EXAMINATION: General: AA male, No distress Neuro: sedated, unresponsive HEENT: Moist mucus membranes Cardiovascular: RRR, no M/R/G.  Lungs: Clear, no wheeze, crackles. Abdomen: Soft, + BS, NT, ND Musculoskeletal: No gross deformities, no edema.  Skin: Intact, warm, no rashes.  LABS:  BMET  Recent Labs Lab 09/29/15 1648 09/29/15 2130 09/30/15 0322  NA 146* 146* 147*  K 3.9 4.1 4.1  CL 117* 115* 116*  CO2 21* 20* 21*  BUN CREATININE 0.85 1.02 1.13  GLUCOSE 109* 112* 118*    Electrolytes  Recent Labs Lab 09/28/15 0620  09/29/15 0015  09/29/15 1648 09/29/15 2130 09/30/15 0322  CALCIUM 7.0*  < > 8.7*  < >  8.5* 8.7* 8.5*  MG 1.7  --  1.6*  --   --   --  1.8  PHOS  --   --  1.7*  --   --   --  3.7  < > = values in this interval not displayed.  CBC  Recent Labs Lab 09/28/15 0410  09/28/15 1249 09/29/15 0640 09/30/15 0322  WBC 10.3  --   --  6.5 9.9  HGB 12.5*  < > 13.3 11.0* 11.5*  HCT 42.2  < > 39.0 33.4* 35.4*  PLT 168  --   --  210 218  < > = values in this interval not displayed.  Coag's  Recent Labs Lab 09/28/15 0410 09/28/15 1000 09/28/15 1245  APTT 23* 66* 43*  INR 1.17 2.21* 1.41    Sepsis Markers  Recent Labs Lab 09/28/15 0520 09/28/15 1032  LATICACIDVEN 3.4* 2.5*    ABG  Recent  Labs Lab 09/28/15 0556 09/28/15 0830 09/30/15 0253  PHART 7.356 7.425 7.426  PCO2ART 53.3* 35.7 34.2*  PO2ART 328.0* 117* 104.0*    Liver Enzymes  Recent Labs Lab 09/28/15 0410  AST 26  ALT 14*  ALKPHOS 133*  BILITOT 1.0  ALBUMIN 3.1*    Cardiac Enzymes  Recent Labs Lab 09/28/15 0645 09/28/15 1240 09/28/15 1700  TROPONINI 30.84* 60.50* >65.00*    Glucose  Recent Labs Lab 09/29/15 1530 09/29/15 1944 09/30/15 0007 09/30/15 0250 09/30/15 0729 09/30/15 1142  GLUCAP 101* 92 103* 104* 108* 79    Imaging Dg Chest Port 1 View  09/30/2015  CLINICAL DATA:  Acute respiratory failure, shortness of breath, cardiac arrest, STEMI EXAM: PORTABLE CHEST 1 VIEW COMPARISON:  Portable chest x-ray of September 28, 2015 FINDINGS: The lungs are well-expanded. There has been overall improvement in the pulmonary interstitial and alveolar opacities. The pulmonary vascularity remains somewhat indistinct. There remain coarse lung markings in the right infrahilar region. There is no large pleural effusion and no pneumothorax. The heart is normal in size. The endotracheal tube tip lies 5 cm above the carina. The esophagogastric tube tip projects below the inferior margin of the image. The left internal jugular venous catheter tip projects over the midportion of the SVC. IMPRESSION: Improving pulmonary interstitial and alveolar edema. No evidence of pneumothorax or large pleural effusion. Subsegmental atelectasis in the right lower lobe posterior medially. The support tubes are in reasonable position. Electronically Signed   By: David  [place] M.D.   On: 09/30/2015 07:23     STUDIES:  Cardiac Cath 03/07 > 100% LAD occlusion > DES placed. CXR 03/07 > pulm edema  CULTURES: None.  ANTIBIOTICS: None.  SIGNIFICANT EVENTS: 03/07 > PEA arrest > taken to cath lab then returned to ICU on vent  > hypothermia started   LINES/TUBES: ETT 03/07 > CVL 03/07 > A line 03/07 >  DISCUSSION: 65 y.o. M  with PMH of PUD.  Was in USOH until early AM hours 03/06 when he developed SOB and chest pain.  Found to have STEMI and upon arrival to ED, had 6 minute cardiac arrest.  Taken to cath lab emergently (had DES to LAD which was 100% occluded) and PCCM called for consideration hypothermia protocol.  ASSESSMENT / PLAN:  CARDIOVASCULAR A: ASSESSMENT / PLAN:  CARDIOVASCULAR A:  Acute anterior STEMI - s/p emergent cardiac cath 03/07. Trop > 65 Echo 10-15% P:  On hypothermia protocol. Levophed as needed to maintain goal MAP > 80 during hypothermia. Trend lactate.  PULMONARY A: VDRF - due  to inability to protect airway in the setting of PEA arrest. Respiratory acidosis. P:   Full vent support. VAP prevention measures. Hold SBT until more awake Albuterol PRN.  RENAL A:   Hypokalemia. Anticipate multiple electrolyte derangements during hypothermia. P:   Correct electrolytes as indicated.  GASTROINTESTINAL A:   Hx PUD. GI prophylaxis. Nutrition. P:   SUP: Pantoprazole. Start tube feeds  HEMATOLOGIC A:   VTE Prophylaxis. P:  SCD's / Heparin. Coags q8hrs. CBC in AM.  INFECTIOUS A:   No indication of infection. P:   Monitor clinically.  ENDOCRINE A:   Hyperglycemia - no hx of DM.  Anticipate worsening during hypothermia.   P:   ICU hyperglycemia protocol. Assess Hgb A1c.  NEUROLOGIC A:   Acute metabolic encephalopathy. EEG with slowing of cerebral activity. P:   Sedation:   On versed drip for agitation.   Family updated: None. Interdisciplinary Family Meeting v Palliative Care Meeting:  Due by: 03/13. CC time: 45 minutes.  Chilton Greathouse MD [facility] Pager [phone] If no answer or after 3pm call: [phone] 09/30/2015, 12:43 PM

## 2015-09-30 NOTE — Progress Notes (Signed)
    Subjective:  Intubated, opens eyes, no purposeful movement   Objective:  Filed Vitals:   09/30/15 0200 09/30/15 0300 09/30/15 0350 09/30/15 0400  BP:    132/73  Pulse: 112 115  112  Temp:    98.4 F (36.9 C)  TempSrc:    Core (Comment)  Resp:      Height:      Weight:      SpO2: 98% 100% 99% 100%    Intake/Output from previous day:  Intake/Output Summary (Last 24 hours) at 09/30/15 0738 Last data filed at 09/30/15 0400  Gross per 24 hour  Intake 923.57 ml  Output    595 ml  Net 328.57 ml    Physical Exam: Physical exam: Well-developed well-nourished intubated and sedated Skin is warm and dry.  HEENT is normal.  Chest with diffuse rhonchi Cardiovascular exam is regular rate and rhythm.  Abdominal exam not distended. No masses palpated. Extremities show no edema. Right groin with no hematoma and no bruit neuro opens eyes but no purposeful movement    Lab Results: Basic Metabolic Panel:  Recent Labs  40/98/11 0015  09/29/15 2130 09/30/15 0322  NA 146*  < > 146* 147*  K 3.4*  < > 4.1 4.1  CL 113*  < > 115* 116*  CO2 21*  < > 20* 21*  GLUCOSE 164*  < > 112* 118*  BUN 15  < > 17 20  CREATININE 0.71  < > 1.02 1.13  CALCIUM 8.7*  < > 8.7* 8.5*  MG 1.6*  --   --  1.8  PHOS 1.7*  --   --  3.7  < > = values in this interval not displayed. CBC:  Recent Labs  09/29/15 0640 09/30/15 0322  WBC 6.5 9.9  HGB 11.0* 11.5*  HCT 33.4* 35.4*  MCV 75.7* 76.3*  PLT 210 218   Cardiac Enzymes:  Recent Labs  09/28/15 0645 09/28/15 1240 09/28/15 1700  TROPONINI 30.84* 60.50* >65.00*     Assessment/Plan:  1 status post anterior myocardial infarction-patient is status post PCI of LAD. Continue aspirin and brilinta. Wean levophed as tolerated. Echocardiogram shows severely reduced LV function. Will add beta blocker and ACE inhibitor when blood pressure allows. Add statin when extubated. Patient will require life vest at DC. Repeat echocardiogram in 3 months.  If ejection fraction less than 35% would require ICD. CVP presently 3. 2 VDRF-Vent management per CCM. 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 May need neurology consult. Will leave to CCM.  Olga Millers 09/30/2015, 7:38 AM   \

## 2015-09-30 NOTE — Progress Notes (Signed)
Nutrition Follow-up  DOCUMENTATION CODES:   Not applicable  INTERVENTION:  -Begin Vital AF 1.2 via OGT, begin @ 87mL/hr, increase by 10 every 6 hours to goal rate of 68mL/hr -Tubefeeding regimen provides 1800 calories, 113g protein, 1216cc free water  NUTRITION DIAGNOSIS:   Inadequate oral intake related to inability to eat as evidenced by NPO status. ongoing  GOAL:   Patient will meet greater than or equal to 90% of their needs  MONITOR:   Diet advancement, Vent status, Labs, I & O's  REASON FOR ASSESSMENT:   Ventilator    ASSESSMENT:   The patient presented with chest pain. He is intubated and unresponsive. The history comes from EMS staff and his wife. He apparently started to have chest pain about one hour ago. Intial EKG showed sinus tachycardia with ST elevation in V2 through V4. EMS was called. He became unresponsive shortly before arriving in the ED.  RD consulted for Tubefeeding mgmt  Patient is currently intubated on ventilator support MV: 15.5 L/min Temp (24hrs), Avg:97.7 F (36.5 C), Min:95.2 F (35.1 C), Max:99.3 F (37.4 C)  Propofol: None  Labs and medications reviewed.   Diet Order:     Skin:  Reviewed, no issues  Last BM:  3/9  Height:   Ht Readings from Last 1 Encounters:  09/28/15 5\' 7"  (1.702 m)    Weight:   Wt Readings from Last 1 Encounters:  09/29/15 148 lb 2.4 oz (67.2 kg)    Ideal Body Weight:  67.27 kg  BMI:  Body mass index is 23.2 kg/(m^2).  Estimated Nutritional Needs:   Kcal:  1815  Protein:  80-100 grams  Fluid:  Per MD rec.  EDUCATION NEEDS:   No education needs identified at this time  Dionne Ano. Sherran Margolis, MS, RD LDN After Hours/Weekend Pager [phone]

## 2015-09-30 NOTE — Progress Notes (Signed)
Pt remains on vent. Left [facility] list in room. No ins listed and no fam in room. Left 30day free brilinta card in room. Pt assist form for brilinta on shadow chart.

## 2015-10-01 ENCOUNTER — Inpatient Hospital Stay (HOSPITAL_COMMUNITY): Payer: BLUE CROSS/BLUE SHIELD

## 2015-10-01 DIAGNOSIS — J9600 Acute respiratory failure, unspecified whether with hypoxia or hypercapnia: Secondary | ICD-10-CM

## 2015-10-01 DIAGNOSIS — G9340 Encephalopathy, unspecified: Secondary | ICD-10-CM

## 2015-10-01 LAB — MAGNESIUM: Magnesium: 2 mg/dL (ref 1.7–2.4)

## 2015-10-01 LAB — CULTURE, RESPIRATORY W GRAM STAIN: Culture: NORMAL

## 2015-10-01 LAB — BASIC METABOLIC PANEL
ANION GAP: 9 (ref 5–15)
BUN: 24 mg/dL — ABNORMAL HIGH (ref 6–20)
CALCIUM: 8.5 mg/dL — AB (ref 8.9–10.3)
CO2: 19 mmol/L — ABNORMAL LOW (ref 22–32)
Chloride: 117 mmol/L — ABNORMAL HIGH (ref 101–111)
Creatinine, Ser: 1.09 mg/dL (ref 0.61–1.24)
Glucose, Bld: 106 mg/dL — ABNORMAL HIGH (ref 65–99)
POTASSIUM: 3.5 mmol/L (ref 3.5–5.1)
Sodium: 145 mmol/L (ref 135–145)

## 2015-10-01 LAB — GLUCOSE, CAPILLARY
GLUCOSE-CAPILLARY: 90 mg/dL (ref 65–99)
Glucose-Capillary: 87 mg/dL (ref 65–99)
Glucose-Capillary: 120 mg/dL — ABNORMAL HIGH (ref 65–99)
Glucose-Capillary: 152 mg/dL — ABNORMAL HIGH (ref 65–99)
Glucose-Capillary: 152 mg/dL — ABNORMAL HIGH (ref 65–99)
Glucose-Capillary: 163 mg/dL — ABNORMAL HIGH (ref 65–99)

## 2015-10-01 LAB — CBC
HEMATOCRIT: 42.3 % (ref 39.0–52.0)
Hemoglobin: 13.9 g/dL (ref 13.0–17.0)
MCH: 25.3 pg — ABNORMAL LOW (ref 26.0–34.0)
MCHC: 32.9 g/dL (ref 30.0–36.0)
MCV: 77 fL — ABNORMAL LOW (ref 78.0–100.0)
PLATELETS: 136 10*3/uL — AB (ref 150–400)
RBC: 5.49 MIL/uL (ref 4.22–5.81)
RDW: 13 % (ref 11.5–15.5)
WBC: 6.1 10*3/uL (ref 4.0–10.5)

## 2015-10-01 LAB — POCT I-STAT 3, ART BLOOD GAS (G3+)
ACID-BASE DEFICIT: 2 mmol/L (ref 0.0–2.0)
Bicarbonate: 20.6 mEq/L (ref 20.0–24.0)
O2 SAT: 99 %
PH ART: 7.494 — AB (ref 7.350–7.450)
TCO2: 21 mmol/L (ref 0–100)
pCO2 arterial: 26.8 mmHg — ABNORMAL LOW (ref 35.0–45.0)
pO2, Arterial: 138 mmHg — ABNORMAL HIGH (ref 80.0–100.0)

## 2015-10-01 LAB — CULTURE, RESPIRATORY

## 2015-10-01 LAB — PHOSPHORUS: PHOSPHORUS: 3.5 mg/dL (ref 2.5–4.6)

## 2015-10-01 MED ORDER — POTASSIUM CHLORIDE 20 MEQ/15ML (10%) PO SOLN
20.0000 meq | ORAL | Status: AC
  Administered 2015-10-01 (×2): 20 meq
  Filled 2015-10-01 (×2): qty 15

## 2015-10-01 MED ORDER — CARVEDILOL 3.125 MG PO TABS
3.1250 mg | ORAL_TABLET | Freq: Two times a day (BID) | ORAL | Status: DC
  Administered 2015-10-01 – 2015-10-05 (×9): 3.125 mg
  Filled 2015-10-01 (×9): qty 1

## 2015-10-01 MED ORDER — DEXMEDETOMIDINE HCL IN NACL 200 MCG/50ML IV SOLN
0.4000 ug/kg/h | INTRAVENOUS | Status: DC
  Administered 2015-10-01 (×2): 0.4 ug/kg/h via INTRAVENOUS
  Administered 2015-10-01: 1 ug/kg/h via INTRAVENOUS
  Administered 2015-10-02 (×4): 1.2 ug/kg/h via INTRAVENOUS
  Filled 2015-10-01 (×7): qty 50

## 2015-10-01 NOTE — Progress Notes (Signed)
[facility] ADULT ICU REPLACEMENT PROTOCOL FOR AM LAB REPLACEMENT ONLY  The patient does apply for the [facility] Adult ICU Electrolyte Replacment Protocol based on the criteria listed below:   1. Is GFR >/= 40 ml/min? Yes.    Patient's GFR today is >60 2. Is urine output >/= 0.5 ml/kg/hr for the last 6 hours? Yes.   Patient's UOP is 0.65 ml/kg/hr 3. Is BUN < 60 mg/dL? Yes.    Patient's BUN today is 24 4. Abnormal electrolyte(s):  K - 3.5 5. Ordered repletion with: PER PROTOCOL 6. If a panic level lab has been reported, has the CCM MD in charge been notified? Yes.  .   Physician:  Dr. Lorre Munroe 10/01/2015 4:52 AM

## 2015-10-01 NOTE — Progress Notes (Signed)
    Subjective:  Intubated, opens eyes, no purposeful movement   Objective:  Filed Vitals:   10/01/15 0200 10/01/15 0300 10/01/15 0327 10/01/15 0400  BP: 119/78 122/76  133/86  Pulse: 95 98  91  Temp:    98.4 F (36.9 C)  TempSrc:    Core (Comment)  Resp:      Height:      Weight:   154 lb 8.7 oz (70.1 kg)   SpO2: 100% 100%  100%    Intake/Output from previous day:  Intake/Output Summary (Last 24 hours) at 10/01/15 0717 Last data filed at 10/01/15 0400  Gross per 24 hour  Intake 601.46 ml  Output    825 ml  Net -223.54 ml    Physical Exam: Physical exam: Well-developed well-nourished intubated and sedated Skin is warm and dry.  HEENT is normal.  Chest with diffuse rhonchi Cardiovascular exam is regular rate and rhythm.  Abdominal exam not distended. No masses palpated. Extremities show no edema. Right groin with no hematoma and no bruit neuro opens eyes but no purposeful movement    Lab Results: Basic Metabolic Panel:  Recent Labs  46/56/81 0322 10/01/15 0331  NA 147* 145  K 4.1 3.5  CL 116* 117*  CO2 21* 19*  GLUCOSE 118* 106*  BUN 20 24*  CREATININE 1.13 1.09  CALCIUM 8.5* 8.5*  MG 1.8 2.0  PHOS 3.7 3.5   CBC:  Recent Labs  09/30/15 0322 10/01/15 0331  WBC 9.9 6.1  HGB 11.5* 13.9  HCT 35.4* 42.3  MCV 76.3* 77.0*  PLT 218 136*   Cardiac Enzymes:  Recent Labs  09/28/15 1240 09/28/15 1700  TROPONINI 60.50* >65.00*     Assessment/Plan:  1 status post anterior myocardial infarction-patient is status post PCI of LAD. Continue aspirin and brilinta. Levophed has been weaned to off. Echocardiogram shows severely reduced LV function. Will add beta blocker low dose coreg this AM; add ACEI next 24-48 hours as BP allows. Add statin when extubated. Patient will require life vest at DC if neuro status improves. Repeat echocardiogram in 3 months. If ejection fraction less than 35% would require ICD.  2 VDRF-Vent management per CCM. 3 Hypoxic  encephalopathy-opens eyes this AM but no purposeful movements. Will request neurology consult.   Olga Millers 10/01/2015, 7:17 AM   \

## 2015-10-01 NOTE — Progress Notes (Signed)
Pt. Was transported to CT & back to 2H08 without any complications.

## 2015-10-01 NOTE — Progress Notes (Addendum)
PULMONARY / CRITICAL CARE MEDICINE   Name: Eddie [name] MRN: [id number] DOB: 1951/06/09    ADMISSION DATE:  09/28/2015 CONSULTATION DATE:  09/28/15  REFERRING MD:  Antoine Poche  CHIEF COMPLAINT:  Cardiac Arrest  HISTORY OF PRESENT ILLNESS:  Pt is encephelopathic; therefore, this HPI is obtained from chart review. Iremide Meseke is a 65 y.o. male with PMH of PUD.  During early AM hours 03/06, EMS was dispatched for SOB and chest pain.  EKG via EMS revealed STEMI.  He was being transported to [facility] ED and upon arrival in ambulance bay, he went unresponsive and staff noted him to be in PEA. He was transferred to ED room where ACLS was continued and he was intubated.  He received 1mg  epinephrine and roughly 6 minutes of ACLS prior to ROSC.   Post ROSC, he was in a.flutter with hypertension.  He was taken to the cath lab emergently and PCCM was called for consideration of hypothermia protocol following cardiac cath.   PAST MEDICAL HISTORY :  He  has a past medical history of PUD (peptic ulcer disease).  (-) DM,CAD, CVA. Was "healthy" according to wife  PAST SURGICAL HISTORY: He  has past surgical history that includes Repair of Peptic Ulcer and Cardiac catheterization (N/A, 09/28/2015).  No Known Allergies  No current facility-administered medications on file prior to encounter.   No current outpatient prescriptions on file prior to encounter.    FAMILY HISTORY:  His has no family status information on file.   DM and CAD in mother side. (-) h/o cancer.  SOCIAL HISTORY: He  reports that he has been smoking.  He does not have any smokeless tobacco history on file.is married. He has 2 children. Smokes 2-5 cigs/day since mid teens. (-) ETOH.  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   REVIEW OF SYSTEMS:  Unable to obtain as pt is encephalopathic. According to wife, pt with chronic on and off cp and cough and SOB. (-) recent illness. (-) fevers, chills, abd pain, edema. Rest of ROS was (-).    SUBJECTIVE:  Rewarmed. Still with poor mental status.   VITAL SIGNS: BP 148/65 mmHg  Pulse 94  Temp(Src) 98.6 F (37 C) (Core (Comment))  Resp 28  Ht 5\' 7"  (1.702 m)  Wt 154 lb 8.7 oz (70.1 kg)  BMI 24.20 kg/m2  SpO2 100%  HEMODYNAMICS: CVP:  [2 mmHg-15 mmHg] 15 mmHg  VENTILATOR SETTINGS: Vent Mode:  [-] PRVC FiO2 (%):  [40 %] 40 % Set Rate:  [28 bmp] 28 bmp Vt Set:  [550 mL] 550 mL PEEP:  [5 cmH20] 5 cmH20 Plateau Pressure:  [22 cmH20-28 cmH20] 28 cmH20  INTAKE / OUTPUT: I/O last 3 completed shifts: In: 1014.1 [I.V.:505.1; NG/GT:509] Out: 1220 [Urine:1070; Emesis/NG output:150]   PHYSICAL EXAMINATION: General: AA male, No distress Neuro: sedated, unresponsive HEENT: Moist mucus membranes Cardiovascular: RRR, no M/R/G.  Lungs: Clear, no wheeze, crackles. Abdomen: Soft, + BS, NT, ND Musculoskeletal: No gross deformities, no edema.  Skin: Intact, warm, no rashes.  LABS:  BMET  Recent Labs Lab 09/29/15 2130 09/30/15 0322 10/01/15 0331  NA 146* 147* 145  K 4.1 4.1 3.5  CL 115* 116* 117*  CO2 20* 21* 19*  BUN 17 20 24*  CREATININE 1.02 1.13 1.09  GLUCOSE 112* 118* 106*    Electrolytes  Recent Labs Lab 09/29/15 0015  09/29/15 2130 09/30/15 0322 10/01/15 0331  CALCIUM 8.7*  < > 8.7* 8.5* 8.5*  MG 1.6*  --   --  1.8 2.0  PHOS 1.7*  --   --  3.7 3.5  < > = values in this interval not displayed.  CBC  Recent Labs Lab 09/29/15 0640 09/30/15 0322 10/01/15 0331  WBC 6.5 9.9 6.1  HGB 11.0* 11.5* 13.9  HCT 33.4* 35.4* 42.3  PLT 210 218 136*    Coag's  Recent Labs Lab 09/28/15 0410 09/28/15 1000 09/28/15 1245  APTT 23* 66* 43*  INR 1.17 2.21* 1.41    Sepsis Markers  Recent Labs Lab 09/28/15 0520 09/28/15 1032  LATICACIDVEN 3.4* 2.5*    ABG  Recent Labs Lab 09/28/15 0830 09/30/15 0253 10/01/15 0339  PHART 7.425 7.426 7.494*  PCO2ART 35.7 34.2* 26.8*  PO2ART 117* 104.0* 138.0*    Liver Enzymes  Recent Labs Lab  09/28/15 0410  AST 26  ALT 14*  ALKPHOS 133*  BILITOT 1.0  ALBUMIN 3.1*    Cardiac Enzymes  Recent Labs Lab 09/28/15 0645 09/28/15 1240 09/28/15 1700  TROPONINI 30.84* 60.50* >65.00*    Glucose  Recent Labs Lab 09/30/15 0729 09/30/15 1142 09/30/15 1628 09/30/15 1943 10/01/15 0031 10/01/15 0843  GLUCAP 108* 79 63* 98 90 120*    Imaging Dg Chest Port 1 View  10/01/2015  CLINICAL DATA:  Acute respiratory failure EXAM: PORTABLE CHEST 1 VIEW COMPARISON:  09/30/2015 FINDINGS: Stable endotracheal tube, NG tube, and central line. Mild cardiac enlargement. Vascular pattern normal. Mild residual interstitial prominence and hazy infrahilar opacity on the right. IMPRESSION: Further improvement in findings of pulmonary edema. Electronically Signed   By: Esperanza Heir M.D.   On: 10/01/2015 07:16     STUDIES:  Cardiac Cath 03/07 > 100% LAD occlusion > DES placed. CXR 03/10 > improving pulm edema  CULTURES: None.  ANTIBIOTICS: None.  SIGNIFICANT EVENTS: 03/07 > PEA arrest > taken to cath lab then returned to ICU on vent  > hypothermia started   LINES/TUBES: ETT 03/07 > CVL 03/07 > A line 03/07 >  DISCUSSION: 65 y.o. M with PMH of PUD.  Was in USOH until early AM hours 03/06 when he developed SOB and chest pain.  Found to have STEMI and upon arrival to ED, had 6 minute cardiac arrest.  Taken to cath lab emergently (had DES to LAD which was 100% occluded) and PCCM called for consideration hypothermia protocol.  ASSESSMENT / PLAN:  CARDIOVASCULAR A: ASSESSMENT / PLAN:  CARDIOVASCULAR A:  Acute anterior STEMI - s/p emergent cardiac cath 03/07. Trop > 65 Echo 10-15% P:  Rewarmed. Off pressors.   PULMONARY A: VDRF - due to inability to protect airway in the setting of PEA arrest. Respiratory acidosis. P:   Full vent support. VAP prevention measures. Wake up and wean trials Reduce RR to correct alkalosis. Albuterol PRN.  RENAL A:    Hypokalemia. Anticipate multiple electrolyte derangements during hypothermia. P:   Correct electrolytes as indicated.  GASTROINTESTINAL A:   Hx PUD. GI prophylaxis. Nutrition. P:   SUP: Pantoprazole. On tube feeds  HEMATOLOGIC A:   VTE Prophylaxis. P:  SCD's / Heparin S.Q.  INFECTIOUS A:   No indication of infection. P:   Monitor clinically.  ENDOCRINE A:   Hyperglycemia - no hx of DM.    P:   ICU hyperglycemia protocol.  NEUROLOGIC A:   Acute metabolic encephalopathy. EEG with slowing of cerebral activity. P:   Wean off sedation to assess neuro status. Try precedex if needed. CT head and repeat EEG. Neuro consult if no improvement.   Family updated: No  family at bedside.  Interdisciplinary Family Meeting v Palliative Care Meeting:  Due by: 03/13. CC time: 45 minutes.  Chilton Greathouse MD [facility] Pager [phone] If no answer or after 3pm call: [phone] 10/01/2015, 11:10 AM

## 2015-10-01 NOTE — Progress Notes (Signed)
EEG Completed; Results Pending  

## 2015-10-01 NOTE — Procedures (Signed)
ELECTROENCEPHALOGRAM REPORT  Date of Study: 10/01/2015  Patient's Name: Eddie [name] MRN: [id number] Date of Birth: 10/25/1950  Referring Provider: Chilton Greathouse, MD  Indication: 65 year old man status post cardiac arrest, intubated and on vent.  Medications: dexmedetomidine (PRECEDEX) 200 MCG/50ML (4 mcg/mL) infusion fentaNYL (SUBLIMAZE)  infusion insulin aspart (novoLOG) injection 2-6 Units insulin glargine (LANTUS) injection 5 Units midazolam (VERSED)  infusion norepinephrine (LEVOPHED) 4 mg in dextrose 5 % 250 mL (0.016 mg/mL) infusion ondansetron (ZOFRAN) injection 4 mg pantoprazole (PROTONIX) injection 40 mg ticagrelor (BRILINTA) tablet 90 mg   Technical Summary: This is a multichannel digital EEG recording, using the international 10-20 placement system with electrodes applied with paste and impedances below 5000 ohms.    Description: The EEG background is symmetric with generalized background slowing and some suppression with no discernible posterior dominant rhythm.  Although there is muscle artifact, the background is not reactive to eye opening.  No focal abnormalities are seen.  No focal or generalized epileptiform discharges are seen.  Stage II sleep is not seen.  Hyperventilation and photic stimulation were not performed.  ECG revealed normal cardiac rate and rhythm.  Impression: This is an abnormal routine EEG of the sedated state due to generalized background slowing.  This is indicative of diffuse cerebral dysfunction which may be due to pharmacologic, hypoxic, toxic-metabolic or other diffuse physiologic etiology.  Adam R. Everlena Cooper, DO

## 2015-10-02 DIAGNOSIS — I255 Ischemic cardiomyopathy: Secondary | ICD-10-CM

## 2015-10-02 DIAGNOSIS — I2101 ST elevation (STEMI) myocardial infarction involving left main coronary artery: Secondary | ICD-10-CM

## 2015-10-02 LAB — BASIC METABOLIC PANEL
Anion gap: 11 (ref 5–15)
BUN: 27 mg/dL — AB (ref 6–20)
CHLORIDE: 115 mmol/L — AB (ref 101–111)
CO2: 21 mmol/L — AB (ref 22–32)
CREATININE: 0.94 mg/dL (ref 0.61–1.24)
Calcium: 8.6 mg/dL — ABNORMAL LOW (ref 8.9–10.3)
GFR calc Af Amer: >60 mL/min (ref >60)
GFR calc non Af Amer: >60 mL/min (ref >60)
Glucose, Bld: 149 mg/dL — ABNORMAL HIGH (ref 65–99)
POTASSIUM: 4.3 mmol/L (ref 3.5–5.1)
Sodium: 147 mmol/L — ABNORMAL HIGH (ref 135–145)

## 2015-10-02 LAB — GLUCOSE, CAPILLARY
GLUCOSE-CAPILLARY: 120 mg/dL — AB (ref 65–99)
GLUCOSE-CAPILLARY: 146 mg/dL — AB (ref 65–99)
Glucose-Capillary: 138 mg/dL — ABNORMAL HIGH (ref 65–99)
Glucose-Capillary: 145 mg/dL — ABNORMAL HIGH (ref 65–99)
Glucose-Capillary: 204 mg/dL — ABNORMAL HIGH (ref 65–99)
Glucose-Capillary: 245 mg/dL — ABNORMAL HIGH (ref 65–99)
Glucose-Capillary: 253 mg/dL — ABNORMAL HIGH (ref 65–99)

## 2015-10-02 MED ORDER — DEXMEDETOMIDINE HCL IN NACL 400 MCG/100ML IV SOLN
0.4000 ug/kg/h | INTRAVENOUS | Status: DC
  Administered 2015-10-02 – 2015-10-04 (×11): 1.2 ug/kg/h via INTRAVENOUS
  Administered 2015-10-04: 1.5 ug/kg/h via INTRAVENOUS
  Administered 2015-10-04: 1.2 ug/kg/h via INTRAVENOUS
  Administered 2015-10-04: 1.5 ug/kg/h via INTRAVENOUS
  Administered 2015-10-05 (×2): 1.7 ug/kg/h via INTRAVENOUS
  Administered 2015-10-05 (×2): 1.5 ug/kg/h via INTRAVENOUS
  Administered 2015-10-05 – 2015-10-06 (×4): 1.7 ug/kg/h via INTRAVENOUS
  Filled 2015-10-02 (×23): qty 100

## 2015-10-02 MED ORDER — PANTOPRAZOLE SODIUM 40 MG PO PACK
40.0000 mg | PACK | Freq: Every day | ORAL | Status: DC
  Administered 2015-10-03 – 2015-10-15 (×11): 40 mg
  Filled 2015-10-02 (×12): qty 20

## 2015-10-02 MED ORDER — LISINOPRIL 2.5 MG PO TABS
2.5000 mg | ORAL_TABLET | Freq: Every day | ORAL | Status: DC
  Administered 2015-10-02 – 2015-10-05 (×4): 2.5 mg via ORAL
  Filled 2015-10-02 (×4): qty 1

## 2015-10-02 NOTE — Progress Notes (Signed)
Wasted 45 cc of Versed in sink, witnessed by 2 RN's-Jessica Le Claire, RN & Carlton Adam, RN

## 2015-10-02 NOTE — Progress Notes (Signed)
    Subjective:  Intubated, opens eyes, no purposeful movement  . antiseptic oral rinse  7 mL Mouth Rinse 10 times per day  . aspirin  81 mg Per Tube Daily  . carvedilol  3.125 mg Per Tube BID WC  . chlorhexidine gluconate  15 mL Mouth Rinse BID  . fentaNYL (SUBLIMAZE) injection  50 mcg Intravenous Once  . heparin  5,000 Units Subcutaneous 3 times per day  . insulin aspart  2-6 Units Subcutaneous 6 times per day  . insulin glargine  5 Units Subcutaneous QHS  . pantoprazole (PROTONIX) IV  40 mg Intravenous Q24H  . sodium chloride flush  10-40 mL Intracatheter Q12H  . ticagrelor  90 mg Oral BID    Objective:  Filed Vitals:   10/02/15 0700 10/02/15 0727 10/02/15 0800 10/02/15 0900  BP: 117/78  117/81 115/78  Pulse: 77  78 76  Temp:  97.9 F (36.6 C) 99.1 F (37.3 C) 99.1 F (37.3 C)  TempSrc:  Oral Core (Comment)   Resp: 20  20 20   Height:      Weight:      SpO2: 100%  100% 100%    Intake/Output from previous day:  Intake/Output Summary (Last 24 hours) at 10/02/15 1008 Last data filed at 10/02/15 0900  Gross per 24 hour  Intake 1941.24 ml  Output    765 ml  Net 1176.24 ml    Physical Exam: Physical exam: Well-developed well-nourished intubated and sedated Skin is warm and dry.  HEENT is normal.  Chest with diffuse rhonchi Cardiovascular exam is regular rate and rhythm.  Abdominal exam not distended. No masses palpated. Extremities show no edema. Right groin with no hematoma and no bruit neuro opens eyes but no purposeful movement    Lab Results: Basic Metabolic Panel:  Recent Labs  68/11/57 0322 10/01/15 0331 10/02/15 0520  NA 147* 145 147*  K 4.1 3.5 4.3  CL 116* 117* 115*  CO2 21* 19* 21*  GLUCOSE 118* 106* 149*  BUN 20 24* 27*  CREATININE 1.13 1.09 0.94  CALCIUM 8.5* 8.5* 8.6*  MG 1.8 2.0  --   PHOS 3.7 3.5  --    CBC:  Recent Labs  09/30/15 0322 10/01/15 0331  WBC 9.9 6.1  HGB 11.5* 13.9  HCT 35.4* 42.3  MCV 76.3* 77.0*  PLT 218  136*   Cardiac Enzymes: No results for input(s): CKTOTAL, CKMB, CKMBINDEX, TROPONINI in the last 72 hours.   Assessment/Plan:  1 status post anterior myocardial infarction-patient is status post PCI of LAD. Continue aspirin and brilinta. Levophed has been weaned to off. Echocardiogram shows severely reduced LV function. Started on low dose carvedilol 3.125 mg po bid yesterday and she is tolerating it well.  We will try to add lisinopril 2.5 mg po daily today, BP stable, crea normal. Add statin when extubated. Patient will require life vest at DC if neuro status improves. Repeat echocardiogram in 3 months. If ejection fraction less than 35% would require ICD.  2 VDRF-Vent management per CCM. 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 Will request neurology consult.   Eddie [name] 10/02/2015, 10:08 AM   \

## 2015-10-02 NOTE — Progress Notes (Signed)
PULMONARY / CRITICAL CARE MEDICINE   Name: Eddie [name] MRN: [id number] DOB: 30-Jan-1951    ADMISSION DATE:  09/28/2015 CONSULTATION DATE:  09/28/15  REFERRING MD:  Antoine Poche  CHIEF COMPLAINT:  Cardiac Arrest  SUBJECTIVE:  Opens eyes.  VITAL SIGNS: BP 125/54 mmHg  Pulse 78  Temp(Src) 99.3 F (37.4 C) (Core (Comment))  Resp 20  Ht 5\' 7"  (1.702 m)  Wt 150 lb 12.7 oz (68.4 kg)  BMI 23.61 kg/m2  SpO2 100%  HEMODYNAMICS: CVP:  [3 mmHg-17 mmHg] 3 mmHg  VENTILATOR SETTINGS: Vent Mode:  [-] PRVC FiO2 (%):  [40 %] 40 % Set Rate:  [20 bmp] 20 bmp Vt Set:  [550 mL] 550 mL PEEP:  [5 cmH20] 5 cmH20 Plateau Pressure:  [22 cmH20-25 cmH20] 24 cmH20  INTAKE / OUTPUT: I/O last 3 completed shifts: In: 2487.3 [I.V.:615.9; NG/GT:1871.4] Out: 1365 [Urine:1365]   PHYSICAL EXAMINATION: General: on vent Neuro: opens eyes spontaneously, not following commands HEENT: ETT in place Cardiac: regular Chest: no wheeze Abd: soft, non tender Ext: no edema Skin: no rashes  LABS:  BMET  Recent Labs Lab 09/30/15 0322 10/01/15 0331 10/02/15 0520  NA 147* 145 147*  K 4.1 3.5 4.3  CL 116* 117* 115*  CO2 21* 19* 21*  BUN 20 24* 27*  CREATININE 1.13 1.09 0.94  GLUCOSE 118* 106* 149*    Electrolytes  Recent Labs Lab 09/29/15 0015  09/30/15 0322 10/01/15 0331 10/02/15 0520  CALCIUM 8.7*  < > 8.5* 8.5* 8.6*  MG 1.6*  --  1.8 2.0  --   PHOS 1.7*  --  3.7 3.5  --   < > = values in this interval not displayed.  CBC  Recent Labs Lab 09/29/15 0640 09/30/15 0322 10/01/15 0331  WBC 6.5 9.9 6.1  HGB 11.0* 11.5* 13.9  HCT 33.4* 35.4* 42.3  PLT 210 218 136*    Coag's  Recent Labs Lab 09/28/15 0410 09/28/15 1000 09/28/15 1245  APTT 23* 66* 43*  INR 1.17 2.21* 1.41    Sepsis Markers  Recent Labs Lab 09/28/15 0520 09/28/15 1032  LATICACIDVEN 3.4* 2.5*    ABG  Recent Labs Lab 09/28/15 0830 09/30/15 0253 10/01/15 0339  PHART 7.425 7.426 7.494*  PCO2ART 35.7  34.2* 26.8*  PO2ART 117* 104.0* 138.0*    Liver Enzymes  Recent Labs Lab 09/28/15 0410  AST 26  ALT 14*  ALKPHOS 133*  BILITOT 1.0  ALBUMIN 3.1*    Cardiac Enzymes  Recent Labs Lab 09/28/15 0645 09/28/15 1240 09/28/15 1700  TROPONINI 30.84* 60.50* >65.00*    Glucose  Recent Labs Lab 10/01/15 1622 10/01/15 2122 10/02/15 0020 10/02/15 0525 10/02/15 0723 10/02/15 1115  GLUCAP 152* 163* 204* 138* 120* 146*    Imaging No results found.   STUDIES:  Cardiac Cath 03/07 > 100% LAD occlusion > DES placed Echo 3/07 >> EF 10 to 15%, grade 1 diastolic  CT head 3/10 >> no acute findings EEG 3/10 >> generalized slowing  CULTURES:  ANTIBIOTICS:  SIGNIFICANT EVENTS: 03/07 > PEA arrest > taken to cath lab then returned to ICU on vent  > hypothermia started   LINES/TUBES: ETT 03/07 > Lt IJ CVL  03/07 > A line 03/07 > 3/11  DISCUSSION: 65 yo male with dyspnea, chest pain from STEMI >> cardiac arrest in ER with 6 minutes before ROSC.  Had PCI with DES to LAD.  ASSESSMENT / PLAN:  CARDIOVASCULAR A:  STEMI. Acute systolic CHF. P:  Rewarmed. Off pressors.  PULMONARY A: Acute respiratory failure from pulmonary edema. P:   Full vent support F/u CXR  RENAL A:   Hypokalemia >> improved. P:   Correct electrolytes as indicated  GASTROINTESTINAL A:   Hx PUD. Nutrition. P:   SUP: Pantoprazole. Tube feeds  HEMATOLOGIC A:   No acute issues. P:  F/u CBC SQ heparin  INFECTIOUS A:   No indication of infection. P:   Monitor clinically  ENDOCRINE A:   Hyperglycemia - no hx of DM.    P:   SSI  NEUROLOGIC A:   Acute encephalopathy cardiac arrest. P:   Neuro consulted by primary team 3/11 RASS goal 0  CC time 31 minutes.  Coralyn Helling, MD [facility] Pulmonary/Critical Care 10/02/2015, 12:47 PM Pager:  [phone] After 3pm call: [phone]

## 2015-10-03 ENCOUNTER — Inpatient Hospital Stay (HOSPITAL_COMMUNITY): Payer: BLUE CROSS/BLUE SHIELD

## 2015-10-03 LAB — GLUCOSE, CAPILLARY
GLUCOSE-CAPILLARY: 100 mg/dL — AB (ref 65–99)
GLUCOSE-CAPILLARY: 145 mg/dL — AB (ref 65–99)
GLUCOSE-CAPILLARY: 189 mg/dL — AB (ref 65–99)
GLUCOSE-CAPILLARY: 219 mg/dL — AB (ref 65–99)
Glucose-Capillary: 224 mg/dL — ABNORMAL HIGH (ref 65–99)
Glucose-Capillary: 180 mg/dL — ABNORMAL HIGH (ref 65–99)
Glucose-Capillary: 178 mg/dL — ABNORMAL HIGH (ref 65–99)

## 2015-10-03 LAB — CBC
HEMATOCRIT: 28.1 % — AB (ref 39.0–52.0)
Hemoglobin: 8.5 g/dL — ABNORMAL LOW (ref 13.0–17.0)
MCH: 24.3 pg — ABNORMAL LOW (ref 26.0–34.0)
MCHC: 30.2 g/dL (ref 30.0–36.0)
MCV: 80.3 fL (ref 78.0–100.0)
PLATELETS: 165 10*3/uL (ref 150–400)
RBC: 3.5 MIL/uL — ABNORMAL LOW (ref 4.22–5.81)
RDW: 13.2 % (ref 11.5–15.5)
WBC: 9.5 10*3/uL (ref 4.0–10.5)

## 2015-10-03 LAB — BASIC METABOLIC PANEL
Anion gap: 7 (ref 5–15)
BUN: 29 mg/dL — AB (ref 6–20)
CHLORIDE: 115 mmol/L — AB (ref 101–111)
CO2: 22 mmol/L (ref 22–32)
Calcium: 8.2 mg/dL — ABNORMAL LOW (ref 8.9–10.3)
Creatinine, Ser: 1.03 mg/dL (ref 0.61–1.24)
GFR calc Af Amer: >60 mL/min (ref >60)
GFR calc non Af Amer: >60 mL/min (ref >60)
GLUCOSE: 248 mg/dL — AB (ref 65–99)
POTASSIUM: 4.4 mmol/L (ref 3.5–5.1)
SODIUM: 144 mmol/L (ref 135–145)

## 2015-10-03 MED ORDER — ACETAMINOPHEN 160 MG/5ML PO SOLN
650.0000 mg | Freq: Four times a day (QID) | ORAL | Status: DC | PRN
  Administered 2015-10-03 – 2015-10-05 (×3): 650 mg via ORAL
  Filled 2015-10-03 (×3): qty 20.3

## 2015-10-03 NOTE — Progress Notes (Addendum)
Patient very agitated, flailing arms and legs out of bed.  Patient is able to follow commands on morning WUA, when calm, but unable to due to agitation/anxiety.  HR in 140's, RR 44, sats dropped to 74% and patient sweaty.  Thick tan secretions suctioned from ETT and patient bagged by respiratory.  Patient given prn Versed and slowly calmed; sats increased, HR back to 110's and RR 14.   Will continue to monitor. Leisha Trinkle, Mitzi Hansen   1215: patient becoming diaphoretic, RR 54 and becoming agitated.  RT notified to put patient back on full vent support.

## 2015-10-03 NOTE — Progress Notes (Signed)
    Subjective:  Intubated, opens eyes, no purposeful movement  . antiseptic oral rinse  7 mL Mouth Rinse 10 times per day  . aspirin  81 mg Per Tube Daily  . carvedilol  3.125 mg Per Tube BID WC  . chlorhexidine gluconate  15 mL Mouth Rinse BID  . heparin  5,000 Units Subcutaneous 3 times per day  . insulin aspart  2-6 Units Subcutaneous 6 times per day  . insulin glargine  5 Units Subcutaneous QHS  . lisinopril  2.5 mg Oral Daily  . pantoprazole sodium  40 mg Per Tube Daily  . ticagrelor  90 mg Oral BID   Objective:  Filed Vitals:   10/03/15 0723 10/03/15 0755 10/03/15 0800 10/03/15 0900  BP: 106/64 106/64 121/69 103/67  Pulse: 75 83 85 79  Temp: 98.9 F (37.2 C) 100 F (37.8 C) 100 F (37.8 C)   TempSrc: Axillary     Resp: 14 21 19 15   Height:      Weight:      SpO2: 100% 100% 100% 100%    Intake/Output from previous day:  Intake/Output Summary (Last 24 hours) at 10/03/15 1011 Last data filed at 10/03/15 0900  Gross per 24 hour  Intake 2874.79 ml  Output   1470 ml  Net 1404.79 ml    Physical Exam: Physical exam: Well-developed well-nourished intubated and sedated Skin is warm and dry.  HEENT is normal.  Chest with diffuse rhonchi Cardiovascular exam is regular rate and rhythm.  Abdominal exam not distended. No masses palpated. Extremities show no edema. Right groin with no hematoma and no bruit neuro opens eyes but no purposeful movement    Lab Results: Basic Metabolic Panel:  Recent Labs  94/58/59 0331 10/02/15 0520 10/03/15 0500  NA 145 147* 144  K 3.5 4.3 4.4  CL 117* 115* 115*  CO2 19* 21* 22  GLUCOSE 106* 149* 248*  BUN 24* 27* 29*  CREATININE 1.09 0.94 1.03  CALCIUM 8.5* 8.6* 8.2*  MG 2.0  --   --   PHOS 3.5  --   --    CBC:  Recent Labs  10/01/15 0331 10/03/15 0500  WBC 6.1 9.5  HGB 13.9 8.5*  HCT 42.3 28.1*  MCV 77.0* 80.3  PLT 136* 165   TTE: 09/28/2015 Left ventricle: The cavity size was moderately dilated. Wall   thickness was increased in a pattern of mild LVH. Systolic  function was severely reduced. The estimated ejection fraction  was in the range of 10% to 15%. of the mid-apicalanteroseptal and  inferior myocardium; consistent with infarction. Doppler  parameters are consistent with abnormal left ventricular  relaxation (grade 1 diastolic dysfunction).   Assessment/Plan:   1 status post anterior myocardial infarction-patient is status post PCI of LAD. Continue aspirin and brilinta. Levophed has been weaned to off. Echocardiogram shows severely reduced LV function. Started on low dose carvedilol 3.125 mg po bid yesterday and she is tolerating it well.  We added lisinopril 2.5 mg yesterday, BP stable, crea normal. Add statin when extubated. Patient will require life vest at DC if neuro status improves. Repeat echocardiogram in 6 weeks. If ejection fraction less than 35% would require ICD.  2 VDRF-Vent management per CCM. 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 Will request neurology consult.   Eddie [name] 10/03/2015, 10:11 AM   \

## 2015-10-03 NOTE — Progress Notes (Signed)
PULMONARY / CRITICAL CARE MEDICINE   Name: Eddie [name] MRN: [id number] DOB: 10-Mar-1951    ADMISSION DATE:  09/28/2015 CONSULTATION DATE:  09/28/15  REFERRING MD:  Antoine Poche  CHIEF COMPLAINT:  Cardiac Arrest  SUBJECTIVE:  Tolerating SBT.  RN reports he follows commands on WUA.  VITAL SIGNS: BP 175/85 mmHg  Pulse 95  Temp(Src) 100.9 F (38.3 C) (Axillary)  Resp 16  Ht 5\' 7"  (1.702 m)  Wt 154 lb 1.6 oz (69.9 kg)  BMI 24.13 kg/m2  SpO2 94%  HEMODYNAMICS: CVP:  [3 mmHg-19 mmHg] 7 mmHg  VENTILATOR SETTINGS: Vent Mode:  [-] PRVC FiO2 (%):  [40 %] 40 % Set Rate:  [14 bmp] 14 bmp Vt Set:  [550 mL] 550 mL PEEP:  [5 cmH20] 5 cmH20 Plateau Pressure:  [21 cmH20-30 cmH20] 21 cmH20  INTAKE / OUTPUT: I/O last 3 completed shifts: In: 4120.1 [I.V.:1658.1; NG/GT:2462] Out: 2095 [Urine:2095]   PHYSICAL EXAMINATION: General: sedated Neuro: RASS -2 HEENT: ETT in place Cardiac: regular Chest: no wheeze Abd: soft, non tender Ext: no edema Skin: no rashes  LABS:  BMET  Recent Labs Lab 10/01/15 0331 10/02/15 0520 10/03/15 0500  NA 145 147* 144  K 3.5 4.3 4.4  CL 117* 115* 115*  CO2 19* 21* 22  BUN 24* 27* 29*  CREATININE 1.09 0.94 1.03  GLUCOSE 106* 149* 248*    Electrolytes  Recent Labs Lab 09/29/15 0015  09/30/15 0322 10/01/15 0331 10/02/15 0520 10/03/15 0500  CALCIUM 8.7*  < > 8.5* 8.5* 8.6* 8.2*  MG 1.6*  --  1.8 2.0  --   --   PHOS 1.7*  --  3.7 3.5  --   --   < > = values in this interval not displayed.  CBC  Recent Labs Lab 09/30/15 0322 10/01/15 0331 10/03/15 0500  WBC 9.9 6.1 9.5  HGB 11.5* 13.9 8.5*  HCT 35.4* 42.3 28.1*  PLT 218 136* 165    Coag's  Recent Labs Lab 09/28/15 0410 09/28/15 1000 09/28/15 1245  APTT 23* 66* 43*  INR 1.17 2.21* 1.41    Sepsis Markers  Recent Labs Lab 09/28/15 0520 09/28/15 1032  LATICACIDVEN 3.4* 2.5*    ABG  Recent Labs Lab 09/28/15 0830 09/30/15 0253 10/01/15 0339  PHART 7.425  7.426 7.494*  PCO2ART 35.7 34.2* 26.8*  PO2ART 117* 104.0* 138.0*    Liver Enzymes  Recent Labs Lab 09/28/15 0410  AST 26  ALT 14*  ALKPHOS 133*  BILITOT 1.0  ALBUMIN 3.1*    Cardiac Enzymes  Recent Labs Lab 09/28/15 0645 09/28/15 1240 09/28/15 1700  TROPONINI 30.84* 60.50* >65.00*    Glucose  Recent Labs Lab 10/02/15 1621 10/02/15 1657 10/02/15 2024 10/02/15 2343 10/03/15 0416 10/03/15 0719  GLUCAP 253* 245* 145* 100* 219* 189*    Imaging Dg Chest Port 1 View  10/03/2015  CLINICAL DATA:  Respiratory failure EXAM: PORTABLE CHEST 1 VIEW COMPARISON:  10/01/2015 FINDINGS: Endotracheal tube terminates 4.5 cm above the carina. Cardiomegaly with pulmonary vascular congestion. No frank interstitial edema. No pleural effusion or pneumothorax. Enteric tube courses into the mid stomach. IMPRESSION: Endotracheal tube terminates 4.5 cm above the carina. Cardiomegaly with pulmonary vascular congestion. No frank interstitial edema. Electronically Signed   By: Charline Bills M.D.   On: 10/03/2015 09:23     STUDIES:  Cardiac Cath 03/07 > 100% LAD occlusion > DES placed Echo 3/07 >> EF 10 to 15%, grade 1 diastolic  CT head 3/10 >> no acute findings EEG  3/10 >> generalized slowing  CULTURES:  ANTIBIOTICS:  SIGNIFICANT EVENTS: 03/07 > PEA arrest > taken to cath lab then returned to ICU on vent  > hypothermia started   LINES/TUBES: ETT 03/07 > Lt IJ CVL  03/07 > A line 03/07 > 3/11  DISCUSSION: 65 yo male with dyspnea, chest pain from STEMI >> cardiac arrest in ER with 6 minutes before ROSC.  Had PCI with DES to LAD.  ASSESSMENT / PLAN:  CARDIOVASCULAR A:  STEMI. Acute systolic CHF. P:  ASA, coreg, lisinopril, brilinta  PULMONARY A: Acute respiratory failure from pulmonary edema. P:   Pressure support wean >> might be ready for extubation soon F/u CXR as needed  RENAL A:   Hypokalemia >> improved. P:   Correct electrolytes as  indicated  GASTROINTESTINAL A:   Hx PUD. Nutrition. P:   Protonix for SUP  HEMATOLOGIC A:   Anemia of critical illness. P:  F/u CBC SQ heparin  INFECTIOUS A:   Low grade temp >> no other evidence for infection. P:   Monitor clinically Prn tylenol  ENDOCRINE A:   Hyperglycemia - no hx of DM.    P:   SSI with lantus  NEUROLOGIC A:   Acute encephalopathy cardiac arrest >> neuro status improved. P:   RASS goal 0  CC time 31 minutes.  Coralyn Helling, MD [facility] Pulmonary/Critical Care 10/03/2015, 11:41 AM Pager:  [phone] After 3pm call: [phone]

## 2015-10-04 ENCOUNTER — Inpatient Hospital Stay (HOSPITAL_COMMUNITY): Payer: BLUE CROSS/BLUE SHIELD

## 2015-10-04 LAB — CBC
HEMATOCRIT: 27 % — AB (ref 39.0–52.0)
HEMOGLOBIN: 7.8 g/dL — AB (ref 13.0–17.0)
MCH: 23.5 pg — AB (ref 26.0–34.0)
MCHC: 28.9 g/dL — AB (ref 30.0–36.0)
MCV: 81.3 fL (ref 78.0–100.0)
Platelets: 227 10*3/uL (ref 150–400)
RBC: 3.32 MIL/uL — ABNORMAL LOW (ref 4.22–5.81)
RDW: 13.3 % (ref 11.5–15.5)
WBC: 10.8 10*3/uL — ABNORMAL HIGH (ref 4.0–10.5)

## 2015-10-04 LAB — BASIC METABOLIC PANEL
Anion gap: 9 (ref 5–15)
BUN: 30 mg/dL — AB (ref 6–20)
CALCIUM: 8.3 mg/dL — AB (ref 8.9–10.3)
CHLORIDE: 113 mmol/L — AB (ref 101–111)
CO2: 24 mmol/L (ref 22–32)
CREATININE: 0.99 mg/dL (ref 0.61–1.24)
GFR calc Af Amer: >60 mL/min (ref >60)
GFR calc non Af Amer: >60 mL/min (ref >60)
GLUCOSE: 224 mg/dL — AB (ref 65–99)
Potassium: 4.8 mmol/L (ref 3.5–5.1)
Sodium: 146 mmol/L — ABNORMAL HIGH (ref 135–145)

## 2015-10-04 LAB — GLUCOSE, CAPILLARY
GLUCOSE-CAPILLARY: 98 mg/dL (ref 65–99)
GLUCOSE-CAPILLARY: 113 mg/dL — AB (ref 65–99)
Glucose-Capillary: 214 mg/dL — ABNORMAL HIGH (ref 65–99)
Glucose-Capillary: 134 mg/dL — ABNORMAL HIGH (ref 65–99)
Glucose-Capillary: 140 mg/dL — ABNORMAL HIGH (ref 65–99)

## 2015-10-04 LAB — TYPE AND SCREEN
ABO/RH(D): A POS
ANTIBODY SCREEN: NEGATIVE

## 2015-10-04 LAB — ABO/RH: ABO/RH(D): A POS

## 2015-10-04 MED ORDER — VANCOMYCIN HCL 10 G IV SOLR
1500.0000 mg | Freq: Once | INTRAVENOUS | Status: AC
  Administered 2015-10-04: 1500 mg via INTRAVENOUS
  Filled 2015-10-04: qty 1500

## 2015-10-04 MED ORDER — VECURONIUM BROMIDE 10 MG IV SOLR
7.0000 mg | Freq: Once | INTRAVENOUS | Status: DC | PRN
  Filled 2015-10-04: qty 10

## 2015-10-04 MED ORDER — VANCOMYCIN HCL IN DEXTROSE 1-5 GM/200ML-% IV SOLN
1000.0000 mg | Freq: Two times a day (BID) | INTRAVENOUS | Status: DC
  Administered 2015-10-05 – 2015-10-07 (×5): 1000 mg via INTRAVENOUS
  Filled 2015-10-04 (×7): qty 200

## 2015-10-04 MED ORDER — CEFTAZIDIME 1 G IJ SOLR
1.0000 g | Freq: Three times a day (TID) | INTRAMUSCULAR | Status: DC
  Administered 2015-10-04 – 2015-10-07 (×9): 1 g via INTRAVENOUS
  Filled 2015-10-04 (×11): qty 1

## 2015-10-04 NOTE — Progress Notes (Signed)
PULMONARY / CRITICAL CARE MEDICINE   Name: Eddie [name] MRN: [id number] DOB: 1950/10/11    ADMISSION DATE:  09/28/2015 CONSULTATION DATE:  09/28/15  REFERRING MD:  Antoine Poche  CHIEF COMPLAINT:  Cardiac Arrest  SUBJECTIVE:  Noted to not have movement in Lt arm overnight >> CT head concerning for CVA.  Also having increased purulent secretions.  VITAL SIGNS: BP 183/88 mmHg  Pulse 164  Temp(Src) 100.6 F (38.1 C) (Axillary)  Resp 24  Ht  (1.702 m)  Wt 156 lb 4.9 oz (70.9 kg)  BMI 24.48 kg/m2  SpO2 93%  VENTILATOR SETTINGS: Vent Mode:  [-] PRVC FiO2 (%):  [40 %-50 %] 50 % Set Rate:  [14 bmp] 14 bmp Vt Set:  [550 mL] 550 mL PEEP:  [5 cmH20] 5 cmH20 Pressure Support:  [5 cmH20] 5 cmH20 Plateau Pressure:  [21 cmH20-24 cmH20] 21 cmH20  INTAKE / OUTPUT: I/O last 3 completed shifts: In: 4498.2 [I.V.:1918.2; NG/GT:2580] Out: 2715 [Urine:2715]   PHYSICAL EXAMINATION: General: agitated Neuro: RASS +1, not moving Lt arm HEENT: ETT in place Cardiac: regular Chest: no wheeze Abd: soft, non tender Ext: no edema Skin: no rashes  LABS:  BMET  Recent Labs Lab 10/02/15 0520 10/03/15 0500 10/04/15 0500  NA 147* 144 146*  K 4.3 4.4 4.8  CL 115* 115* 113*  CO2 21* 22 24  BUN 27* 29* 30*  CREATININE 0.94 1.03 0.99  GLUCOSE 149* 248* 224*    Electrolytes  Recent Labs Lab 09/29/15 0015  09/30/15 0322 10/01/15 0331 10/02/15 0520 10/03/15 0500 10/04/15 0500  CALCIUM 8.7*  < > 8.5* 8.5* 8.6* 8.2* 8.3*  MG 1.6*  --  1.8 2.0  --   --   --   PHOS 1.7*  --  3.7 3.5  --   --   --   < > = values in this interval not displayed.  CBC  Recent Labs Lab 10/01/15 0331 10/03/15 0500 10/04/15 0500  WBC 6.1 9.5 10.8*  HGB 13.9 8.5* 7.8*  HCT 42.3 28.1* 27.0*  PLT 136* 165 227    Coag's  Recent Labs Lab 09/28/15 0410 09/28/15 1000 09/28/15 1245  APTT 23* 66* 43*  INR 1.17 2.21* 1.41    Sepsis Markers  Recent Labs Lab 09/28/15 0520 09/28/15 1032   LATICACIDVEN 3.4* 2.5*    ABG  Recent Labs Lab 09/28/15 0830 09/30/15 0253 10/01/15 0339  PHART 7.425 7.426 7.494*  PCO2ART 35.7 34.2* 26.8*  PO2ART 117* 104.0* 138.0*    Liver Enzymes  Recent Labs Lab 09/28/15 0410  AST 26  ALT 14*  ALKPHOS 133*  BILITOT 1.0  ALBUMIN 3.1*    Cardiac Enzymes  Recent Labs Lab 09/28/15 0645 09/28/15 1240 09/28/15 1700  TROPONINI 30.84* 60.50* >65.00*    Glucose  Recent Labs Lab 10/03/15 1147 10/03/15 1522 10/03/15 1947 10/03/15 2329 10/04/15 0415 10/04/15 0715  GLUCAP 224* 180* 178* 145* 214* 134*    Imaging Ct Head Wo Contrast  10/04/2015  CLINICAL DATA:  New left-sided weakness. EXAM: CT HEAD WITHOUT CONTRAST TECHNIQUE: Contiguous axial images were obtained from the base of the skull through the vertex without intravenous contrast. COMPARISON:  10/01/2015 FINDINGS: Skull and Sinuses:No acute osseous finding. Nasopharyngeal fluid in the setting of intubation. Sphenoid sinusitis. Visualized orbits: Right cataract resection and probable glaucoma drainage device. Brain: No evidence of cortical infarction, hemorrhage, hydrocephalus, or mass lesion/mass effect. Small areas of centrum semiovale low-density bilaterally is likely small vessel ischemic change but the finding is better/newly  seen on the right. IMPRESSION: 1. No hemorrhage or cortical infarct. 2. Small area of right frontal white matter low density would usually reflect chronic microvascular disease but is newly seen, raising the possibility of interval white matter infarct. Electronically Signed   By: Marnee Spring M.D.   On: 10/04/2015 03:33     STUDIES:  Cardiac Cath 03/07 > 100% LAD occlusion > DES placed Echo 3/07 >> EF 10 to 15%, grade 1 diastolic  CT head 3/10 >> no acute findings EEG 3/10 >> generalized slowing CT head 3/13 >> Rt frontal white matter low density  CULTURES: 3/13 Sputum >>  ANTIBIOTICS: 3/13 Vancomycin >> 3/13 Fortaz >>  SIGNIFICANT  EVENTS: 03/07 PEA arrest > taken to cath lab then returned to ICU on vent  > hypothermia started  3/13 Possible CVA >> neuro consulted, increased respiratory secretions >> started ABx  LINES/TUBES: ETT 03/07 > Lt IJ CVL  03/07 > A line 03/07 > 3/11  DISCUSSION: 65 yo male with dyspnea, chest pain from STEMI >> cardiac arrest in ER with 6 minutes before ROSC.  Had PCI with DES to LAD.  ASSESSMENT / PLAN:  CARDIOVASCULAR A:  STEMI. Acute systolic CHF. P:  ASA, coreg, lisinopril, brilinta  PULMONARY A: Acute respiratory failure from pulmonary edema, HCAP. P:   Full vent support F/u CXR  RENAL A:   Hypokalemia >> improved. P:   Correct electrolytes as indicated  GASTROINTESTINAL A:   Hx PUD. Nutrition. P:   Protonix for SUP  HEMATOLOGIC A:   Anemia of critical illness >> no evidence for bleeding. P:  F/u CBC SQ heparin  INFECTIOUS A:   Fever, increased WBC and increase respiratory secretions noted 3/13. P:   Add vancomycin, fortaz Sent sputum culture  ENDOCRINE A:   Hyperglycemia - no hx of DM.    P:   SSI with lantus  NEUROLOGIC A:   Acute encephalopathy cardiac arrest. Possible new CVA with Lt arm weakness noted 3/13. P:   RASS goal 0 F/u MRI brain Neurology consulted  CC time 31 minutes.  Coralyn Helling, MD [facility] Pulmonary/Critical Care 10/04/2015, 10:55 AM Pager:  [phone] After 3pm call: [phone]

## 2015-10-04 NOTE — Progress Notes (Signed)
Dr.Ramaswamy made aware of concern that left side of body appeared flacid. Difficult to access due to intermittent periods of following commands

## 2015-10-04 NOTE — Progress Notes (Signed)
MRI scheduled for 1600 this AM, Called MRI to confirm transport, MRI tech reported that MRI is backed up at this time and for RN to call back at 1700 to see if able to come down for MRI.

## 2015-10-04 NOTE — Progress Notes (Signed)
Pharmacy Antibiotic Note  Eddie [name] is a 65 y.o. male admitted on 09/28/2015 with CP and noted with PEA now s/p cath w/ PCI/DES.Marland Kitchen  Pharmacy has been consulted for fortaz/vancomycin dosing for possible PNA.  -WBC= 10.8, tmax= 101.4, SCr= 0.99 and CrCl ~ 70  Plan: -Fortaz 1gm IV q8h -Vancomycin 1500mg  IV x1 followed by 1000mg  IV q12h -Will follow renal function, cultures and clinical progress   Height: 5\' 7"  (170.2 cm) Weight: 156 lb 4.9 oz (70.9 kg) IBW/kg (Calculated) : 66.1  Temp (24hrs), Avg:99.5 F (37.5 C), Min:98.2 F (36.8 C), Max:100.6 F (38.1 C)   Recent Labs Lab 09/28/15 0520  09/28/15 1032  09/29/15 0640  09/30/15 0322 10/01/15 0331 10/02/15 0520 10/03/15 0500 10/04/15 0500  WBC  --   --   --   --  6.5  --  9.9 6.1  --  9.5 10.8*  CREATININE  --   < >  --   < >  --   < > 1.13 1.09 0.94 1.03 0.99  LATICACIDVEN 3.4*  --  2.5*  --   --   --   --   --   --   --   --   < > = values in this interval not displayed.  Estimated Creatinine Clearance: 70.5 mL/min (by C-G formula based on Cr of 0.99).    No Known Allergies  Antimicrobials this admission: 3/13 vanc 3/13 fortaz  Dose adjustments this admission: none  Microbiology results: 3/13 resp 3/7 MRSA PCR- neg  Thank you for allowing pharmacy to be a part of this patient's care.  Harland German, Pharm D 10/04/2015 12:05 PM

## 2015-10-04 NOTE — Progress Notes (Signed)
eLink Physician-Brief Progress Note Patient Name: Eddie [name] DOB: February 05, 1951 MRN: [id number]   Date of Service  10/04/2015  HPI/Events of Note  Delirium - trying to get out of bed. Already on Fentanyl and Precedex IV infusions at maximum doses.   eICU Interventions  Will increase ceiling on Precedex IV infusion to 1.7 mcg/kg/hour.      Intervention Category Major Interventions: Delirium, psychosis, severe agitation - evaluation and management  Eddie [name] 10/04/2015, 4:23 PM

## 2015-10-04 NOTE — Progress Notes (Addendum)
Preparing patient for CT Scan, suctioning copious amounts of secretions via ETT and noted left leg now moving and bending but no movement from left arm noted. This note from J.Roberts RN

## 2015-10-04 NOTE — Progress Notes (Signed)
Unable to go to MRI at this time due to RT unavailable. RT in stat CT, other RT in ED with coding patient. Will call MRI back to confirm a later time.

## 2015-10-04 NOTE — Progress Notes (Signed)
eLink Physician-Brief Progress Note Patient Name: Eddie [name] DOB: Aug 01, 1950 MRN: [id number]   Date of Service  10/04/2015  HPI/Events of Note  RN noticing new left side weakness  eICU Interventions  Head ct     Intervention Category Intermediate Interventions: Change in mental status - evaluation and management  [name] 10/04/2015, 1:42 AM

## 2015-10-04 NOTE — Progress Notes (Signed)
Patient Name: Eddie [name] Date of Encounter: 10/04/2015  Active Problems:   Cardiac arrest (HCC)   ST elevation myocardial infarction (STEMI) (HCC)   STEMI (ST elevation myocardial infarction) (HCC)   Encounter for central line placement   Hypokalemia   Encephalopathy acute   Acute hypoxemic respiratory failure (HCC)   Acute respiratory failure (HCC)   Length of Stay: 6  SUBJECTIVE  Intubated and lightly sedated. Moves right side and left leg, not moving left upper extremity. Clearly responds to voice, but not following commands for me (although he was doing so for nurse earlier).  CURRENT MEDS . antiseptic oral rinse  7 mL Mouth Rinse 10 times per day  . aspirin  81 mg Per Tube Daily  . carvedilol  3.125 mg Per Tube BID WC  . chlorhexidine gluconate  15 mL Mouth Rinse BID  . heparin  5,000 Units Subcutaneous 3 times per day  . insulin aspart  2-6 Units Subcutaneous 6 times per day  . insulin glargine  5 Units Subcutaneous QHS  . lisinopril  2.5 mg Oral Daily  . pantoprazole sodium  40 mg Per Tube Daily  . ticagrelor  90 mg Oral BID    OBJECTIVE   Intake/Output Summary (Last 24 hours) at 10/04/15 0858 Last data filed at 10/04/15 0800  Gross per 24 hour  Intake 3014.21 ml  Output   1740 ml  Net 1274.21 ml   Filed Weights   10/02/15 0208 10/03/15 0424 10/04/15 0500  Weight: 68.4 kg (150 lb 12.7 oz) 69.9 kg (154 lb 1.6 oz) 70.9 kg (156 lb 4.9 oz)    PHYSICAL EXAM Filed Vitals:   10/04/15 0700 10/04/15 0717 10/04/15 0800 10/04/15 0820  BP: 212/136 212/136 163/88 163/88  Pulse: 101 155 111 121  Temp:  100.6 F (38.1 C)    TempSrc:  Axillary    Resp: Height:      Weight:      SpO2: 100% 100% 100%    General: Alert, not consistently following commands Head: no evidence of trauma, PERRL, EOMI, no exophtalmos or lid lag, no myxedema, no xanthelasma; normal ears, nose and oropharynx Neck: normal jugular venous pulsations and no hepatojugular reflux;  brisk carotid pulses without delay and no carotid bruits Chest: clear to auscultation, no signs of consolidation by percussion or palpation, normal fremitus, symmetrical and full respiratory excursions Cardiovascular: normal position and quality of the apical impulse, regular rhythm, normal first and second heart sounds, no rubs or gallops, no murmur Abdomen: no tenderness or distention, no masses by palpation, no abnormal pulsatility or arterial bruits, normal bowel sounds, no hepatosplenomegaly Extremities: no clubbing, cyanosis or edema; 2+ radial, ulnar and brachial pulses bilaterally; 2+ right femoral, posterior tibial and dorsalis pedis pulses; 2+ left femoral, posterior tibial and dorsalis pedis pulses; no subclavian or femoral bruits Neurological: grossly nonfocal  LABS  CBC  Recent Labs  10/03/15 0500 10/04/15 0500  WBC 9.5 10.8*  HGB 8.5* 7.8*  HCT 28.1* 27.0*  MCV 80.3 81.3  PLT 165 227   Basic Metabolic Panel  Recent Labs  10/03/15 0500 10/04/15 0500  NA 144 146*  K 4.4 4.8  CL 115* 113*  CO2 22 24  GLUCOSE 248* 224*  BUN 29* 30*  CREATININE 1.03 0.99  CALCIUM 8.2* 8.3*   Liver Function Tests  Radiology Studies Imaging results have been reviewed and Ct Head Wo Contrast  10/04/2015  CLINICAL DATA:  New left-sided weakness. EXAM: CT HEAD WITHOUT  CONTRAST TECHNIQUE: Contiguous axial images were obtained from the base of the skull through the vertex without intravenous contrast. COMPARISON:  10/01/2015 FINDINGS: Skull and Sinuses:No acute osseous finding. Nasopharyngeal fluid in the setting of intubation. Sphenoid sinusitis. Visualized orbits: Right cataract resection and probable glaucoma drainage device. Brain: No evidence of cortical infarction, hemorrhage, hydrocephalus, or mass lesion/mass effect. Small areas of centrum semiovale low-density bilaterally is likely small vessel ischemic change but the finding is better/newly seen on the right. IMPRESSION: 1. No  hemorrhage or cortical infarct. 2. Small area of right frontal white matter low density would usually reflect chronic microvascular disease but is newly seen, raising the possibility of interval white matter infarct. Electronically Signed   By: Marnee Spring M.D.   On: 10/04/2015 03:33   Dg Chest Port 1 View  10/03/2015  CLINICAL DATA:  Respiratory failure EXAM: PORTABLE CHEST 1 VIEW COMPARISON:  10/01/2015 FINDINGS: Endotracheal tube terminates 4.5 cm above the carina. Cardiomegaly with pulmonary vascular congestion. No frank interstitial edema. No pleural effusion or pneumothorax. Enteric tube courses into the mid stomach. IMPRESSION: Endotracheal tube terminates 4.5 cm above the carina. Cardiomegaly with pulmonary vascular congestion. No frank interstitial edema. Electronically Signed   By: Charline Bills M.D.   On: 10/03/2015 09:23    TELE Sinus tachycardia  ASSESSMENT AND PLAN  1. S/P cardiac arrest - witnessed, relatively short resuscitation, had early cooling. Would expect reasonably good neuro outcome, but there is concern about new neuro deficits. 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 2. S/p anterior STEMI and PCI-DES - have to continue ASA/ticagrelor. On low dose carvedilol. Will add ACE inh. , but use low dose to avoid hypotension in face of possible recent CVA. Renal function is normal. 3. Acute systolic heart failure - EF of 10-15% immediately post MI/arrest may still improve. Note LV is already dilated, suggesting some degree of chronic cardiomyopathy 3. Ventilator-dependent respiratory failure.  Hold off weaning attempts until neuro situation clarified. He is a smoker.  4. Possible new CVA (new right frontal white matter infarct?). For MRI and neuro consult today.  5. Anemia - sudden drop in Hgb without overt bleeding without hemodynamic instability (tachycardia seems more related to agitation and both HR and BP are normal with sedation), melena or  hematemesis.  Thurmon Fair, MD, [facility] [phone] office [phone] pager 10/04/2015 8:58 AM

## 2015-10-04 NOTE — Progress Notes (Addendum)
eLink Physician-Brief Progress Note Patient Name: Eddie [name] DOB: 1950-08-21 MRN: [id number]   Date of Service  10/04/2015  HPI/Events of Note  CT head with possible new right side stroke  eICU Interventions  D/ w Neuro consult  -Dr Corliss Blacker -> get MRI wo contrast first and then reassess      Intervention Category Intermediate Interventions: Diagnostic test evaluation;Communication with other healthcare providers and/or family  Eddie [name] 10/04/2015, 4:14 AM

## 2015-10-05 ENCOUNTER — Encounter (HOSPITAL_COMMUNITY): Payer: Self-pay | Admitting: Radiology

## 2015-10-05 ENCOUNTER — Inpatient Hospital Stay (HOSPITAL_COMMUNITY): Payer: BLUE CROSS/BLUE SHIELD

## 2015-10-05 DIAGNOSIS — R4182 Altered mental status, unspecified: Secondary | ICD-10-CM | POA: Insufficient documentation

## 2015-10-05 DIAGNOSIS — I5041 Acute combined systolic (congestive) and diastolic (congestive) heart failure: Secondary | ICD-10-CM

## 2015-10-05 DIAGNOSIS — R410 Disorientation, unspecified: Secondary | ICD-10-CM

## 2015-10-05 DIAGNOSIS — Z8673 Personal history of transient ischemic attack (TIA), and cerebral infarction without residual deficits: Secondary | ICD-10-CM | POA: Insufficient documentation

## 2015-10-05 DIAGNOSIS — I63311 Cerebral infarction due to thrombosis of right middle cerebral artery: Secondary | ICD-10-CM

## 2015-10-05 DIAGNOSIS — G8190 Hemiplegia, unspecified affecting unspecified side: Secondary | ICD-10-CM | POA: Insufficient documentation

## 2015-10-05 DIAGNOSIS — I6340 Cerebral infarction due to embolism of unspecified cerebral artery: Secondary | ICD-10-CM

## 2015-10-05 DIAGNOSIS — I63511 Cerebral infarction due to unspecified occlusion or stenosis of right middle cerebral artery: Secondary | ICD-10-CM

## 2015-10-05 DIAGNOSIS — J80 Acute respiratory distress syndrome: Secondary | ICD-10-CM | POA: Insufficient documentation

## 2015-10-05 LAB — CBC
HCT: 25.9 % — ABNORMAL LOW (ref 39.0–52.0)
Hemoglobin: 7.6 g/dL — ABNORMAL LOW (ref 13.0–17.0)
MCH: 23.9 pg — AB (ref 26.0–34.0)
MCHC: 29.3 g/dL — ABNORMAL LOW (ref 30.0–36.0)
MCV: 81.4 fL (ref 78.0–100.0)
PLATELETS: 226 10*3/uL (ref 150–400)
RBC: 3.18 MIL/uL — AB (ref 4.22–5.81)
RDW: 13.2 % (ref 11.5–15.5)
WBC: 12 10*3/uL — ABNORMAL HIGH (ref 4.0–10.5)

## 2015-10-05 LAB — LIPID PANEL
CHOL/HDL RATIO: 7.2 ratio
CHOLESTEROL: 136 mg/dL (ref 0–200)
HDL: 19 mg/dL — AB (ref >40)
LDL Cholesterol: 99 mg/dL (ref 0–99)
Triglycerides: 92 mg/dL (ref <150)
VLDL: 18 mg/dL (ref 0–40)

## 2015-10-05 LAB — COMPREHENSIVE METABOLIC PANEL
ALBUMIN: 1.9 g/dL — AB (ref 3.5–5.0)
ALT: 10 U/L — AB (ref 17–63)
AST: 16 U/L (ref 15–41)
Alkaline Phosphatase: 72 U/L (ref 38–126)
Anion gap: 10 (ref 5–15)
BUN: 27 mg/dL — ABNORMAL HIGH (ref 6–20)
CHLORIDE: 112 mmol/L — AB (ref 101–111)
CO2: 26 mmol/L (ref 22–32)
CREATININE: 0.99 mg/dL (ref 0.61–1.24)
Calcium: 8.4 mg/dL — ABNORMAL LOW (ref 8.9–10.3)
GFR calc non Af Amer: >60 mL/min (ref >60)
Glucose, Bld: 110 mg/dL — ABNORMAL HIGH (ref 65–99)
Potassium: 4.5 mmol/L (ref 3.5–5.1)
SODIUM: 148 mmol/L — AB (ref 135–145)
Total Bilirubin: 1.1 mg/dL (ref 0.3–1.2)
Total Protein: 5.8 g/dL — ABNORMAL LOW (ref 6.5–8.1)

## 2015-10-05 LAB — URINALYSIS W MICROSCOPIC (NOT AT ARMC)
BILIRUBIN URINE: NEGATIVE
GLUCOSE, UA: 100 mg/dL — AB
KETONES UR: 15 mg/dL — AB
Nitrite: NEGATIVE
PH: 5.5 (ref 5.0–8.0)
PROTEIN: 30 mg/dL — AB
Specific Gravity, Urine: 1.037 — ABNORMAL HIGH (ref 1.005–1.030)

## 2015-10-05 LAB — GLUCOSE, CAPILLARY
GLUCOSE-CAPILLARY: 159 mg/dL — AB (ref 65–99)
GLUCOSE-CAPILLARY: 158 mg/dL — AB (ref 65–99)
Glucose-Capillary: 107 mg/dL — ABNORMAL HIGH (ref 65–99)
Glucose-Capillary: 133 mg/dL — ABNORMAL HIGH (ref 65–99)
Glucose-Capillary: 160 mg/dL — ABNORMAL HIGH (ref 65–99)
Glucose-Capillary: 302 mg/dL — ABNORMAL HIGH (ref 65–99)

## 2015-10-05 LAB — HAPTOGLOBIN: Haptoglobin: 443 mg/dL — ABNORMAL HIGH (ref 34–200)

## 2015-10-05 LAB — TSH: TSH: 0.196 u[IU]/mL — ABNORMAL LOW (ref 0.350–4.500)

## 2015-10-05 LAB — VITAMIN B12: Vitamin B-12: 456 pg/mL (ref 180–914)

## 2015-10-05 MED ORDER — FREE WATER
200.0000 mL | Freq: Four times a day (QID) | Status: DC
  Administered 2015-10-05 – 2015-10-06 (×8): 200 mL

## 2015-10-05 MED ORDER — VITAL HIGH PROTEIN PO LIQD
1000.0000 mL | ORAL | Status: DC
  Administered 2015-10-05: 1000 mL

## 2015-10-05 MED ORDER — FUROSEMIDE 10 MG/ML IJ SOLN
40.0000 mg | Freq: Once | INTRAMUSCULAR | Status: AC
  Administered 2015-10-05: 40 mg via INTRAVENOUS
  Filled 2015-10-05: qty 4

## 2015-10-05 MED ORDER — INSULIN ASPART 100 UNIT/ML ~~LOC~~ SOLN
2.0000 [IU] | SUBCUTANEOUS | Status: DC
  Administered 2015-10-05 (×2): 4 [IU] via SUBCUTANEOUS
  Administered 2015-10-05 – 2015-10-06 (×2): 6 [IU] via SUBCUTANEOUS
  Administered 2015-10-06: 2 [IU] via SUBCUTANEOUS
  Administered 2015-10-06: 6 [IU] via SUBCUTANEOUS

## 2015-10-05 MED ORDER — CARVEDILOL 6.25 MG PO TABS
6.2500 mg | ORAL_TABLET | Freq: Two times a day (BID) | ORAL | Status: DC
  Administered 2015-10-05 – 2015-10-09 (×5): 6.25 mg
  Filled 2015-10-05 (×7): qty 1

## 2015-10-05 MED ORDER — IOHEXOL 350 MG/ML SOLN
80.0000 mL | Freq: Once | INTRAVENOUS | Status: AC | PRN
  Administered 2015-10-05: 50 mL via INTRAVENOUS

## 2015-10-05 MED ORDER — VITAL AF 1.2 CAL PO LIQD
1000.0000 mL | ORAL | Status: DC
  Administered 2015-10-05 – 2015-10-11 (×9): 1000 mL

## 2015-10-05 MED ORDER — LISINOPRIL 5 MG PO TABS
5.0000 mg | ORAL_TABLET | Freq: Every day | ORAL | Status: DC
  Filled 2015-10-05: qty 1

## 2015-10-05 MED ORDER — VITAL HIGH PROTEIN PO LIQD: 1000.0000 mL | ORAL | Status: DC

## 2015-10-05 MED FILL — Medication: Qty: 1 | Status: AC

## 2015-10-05 NOTE — Progress Notes (Signed)
Nutrition Follow-up  INTERVENTION:  -Initiate Vital AF 1.2 via OG tube @ 20 ml/h increasing by 10 ml every 4 hours until goal of 60 ml/hr  - Free water flushed 200 ml QID, 800 ml daily   TF regimen provides 1728 kcal, 108 grams of protein, and 1167 ml of free water.  Total free water 1967 ml  NUTRITION DIAGNOSIS:   Inadequate oral intake related to inability to eat as evidenced by NPO status.  -Ongoing  GOAL:   Patient will meet greater than or equal to 90% of their needs  -Unmet  MONITOR:   Diet advancement, Vent status, Labs, I & O's  ASSESSMENT:   The patient presented with chest pain. He is intubated and unresponsive. The history comes from EMS staff and his wife. He apparently started to have chest pain about one hour ago. Intial EKG showed sinus tachycardia with ST elevation in V2 through V4. EMS was called. He became unresponsive shortly before arriving in the ED.  3/7-OG tube placed 3/9-TF initiated  3/12-TF discontinued pt with stroke symptoms-workup pending  3/14-Consult to resume TF   MVe: 7.9 Temp: 38C Propofol: none   Conducted nutrition focused physical exam, identified no muscle wasting, no fat wasting, no edema present.   Labs reviewed; sodium 148, free water started. Medications reviewed.   Diet Order:  Diet NPO time specified  Skin:  Reviewed, no issues  Last BM:  10/01/2015  Height:   Ht Readings from Last 1 Encounters:  09/28/15 5\' 7"  (1.702 m)    Weight:   Wt Readings from Last 1 Encounters:  10/05/15 157 lb 10.1 oz (71.5 kg)    Ideal Body Weight:  67.27 kg  BMI:  Body mass index is 24.68 kg/(m^2).  Estimated Nutritional Needs:   Kcal:  1788  Protein:  90-110 grams  Fluid:  1.7 L  EDUCATION NEEDS:   No education needs identified at this time  [place], Dietetic Intern Pager: [phone]

## 2015-10-05 NOTE — Consult Note (Signed)
Chief Complaint: R frontal stroke  History obtained from:  Patient   Chart    HPI:                                                                                                                                         Eddie [name] is an 65 y.o. male who presented for PEA arrest, was placed on hypothermia protocol early, found to have acute STEMI and had PCI of LAD. Upon waking up post hypothermia was noted to have some mild L sided weakness and a CTH and MRI were preformed. MRI shows small acute R frontal lobe infract.     Past Medical History  Diagnosis Date  . PUD (peptic ulcer disease)     Past Surgical History  Procedure Laterality Date  . Repair of peptic ulcer    . Cardiac catheterization N/A 09/28/2015    Procedure: Left Heart Cath and Coronary Angiography;  Surgeon: Runell Gess, MD;  Location: [facility] INVASIVE CV LAB;  Service: Cardiovascular;  Laterality: N/A;    History reviewed. No pertinent family history. Social History:  reports that he has been smoking.  He does not have any smokeless tobacco history on file. His alcohol and drug histories are not on file.  Allergies: No Known Allergies  Medications:                                                                                                                           I have reviewed the patient's current medications.  ROS:                                                                                                                                       History obtained from chart review  General  ROS: negative for - chills, fatigue, fever, night sweats, weight gain or weight loss Psychological ROS: negative for - behavioral disorder, hallucinations, memory difficulties, mood swings or suicidal ideation Ophthalmic ROS: negative for - blurry vision, double vision, eye pain or loss of vision ENT ROS: negative for - epistaxis, nasal discharge, oral lesions, sore throat, tinnitus or vertigo Allergy and  Immunology ROS: negative for - hives or itchy/watery eyes Hematological and Lymphatic ROS: negative for - bleeding problems, bruising or swollen lymph nodes Endocrine ROS: negative for - galactorrhea, hair pattern changes, polydipsia/polyuria or temperature intolerance Respiratory ROS: negative for - cough, hemoptysis, shortness of breath or wheezing Cardiovascular ROS: negative for - chest pain, dyspnea on exertion, edema or irregular heartbeat Gastrointestinal ROS: negative for - abdominal pain, diarrhea, hematemesis, nausea/vomiting or stool incontinence Genito-Urinary ROS: negative for - dysuria, hematuria, incontinence or urinary frequency/urgency Musculoskeletal ROS: negative for - joint swelling or muscular weakness Neurological ROS: as noted in HPI Dermatological ROS: negative for rash and skin lesion changes  Neurologic Examination:                                                                                                      Blood pressure 133/68, pulse 81, temperature 99.5 F (37.5 C), temperature source Core (Comment), resp. rate 16, height 5\' 7"  (1.702 m), weight 70.9 kg (156 lb 4.9 oz), SpO2 100 %.  HEENT-  Normocephalic, no lesions, without obvious abnormality.  Normal external eye and conjunctiva.  Normal TM's bilaterally.  Normal auditory canals and external ears. Normal external nose, mucus membranes and septum.  Normal pharynx. Cardiovascular- regular rate and rhythm, S1, S2 normal, no murmur, click, rub or gallop, pulses palpable throughout   Lungs- chest clear, no wheezing, rales, normal symmetric air entry, Heart exam - S1, S2 normal, no murmur, no gallop, rate regular Abdomen- soft, non-tender; bowel sounds normal; no masses,  no organomegaly Extremities- no edema Musculoskeletal-no joint tenderness, deformity or swelling   Neurological Examination Mental Status: Awake, intubated, only command was able to follow was opening and closing eyes consistently on  command Cranial Nerves: II: Discs flat bilaterally; Visual fields grossly normal, pupils Right was 19mm and brisk, round, Left is 62mm round, and sluggishly reactive to light  III,IV, VI: ptosis not present, extra-ocular motions intact bilaterally V,VII: face appears symmetric but difficult to assess due to ET tube Corneal intact  Motor:  Right : Upper extremity   0/5    Left:     Upper extremity   0/5  Lower extremity   0/5     Lower extremity   0/5 ; no atrophy noted Sensory: Did not withdraw to painful stimuli in any extremity, possibly due to heavy sedation  Gait: Not tested       Lab Results: Basic Metabolic Panel:  Recent Labs Lab 09/28/15 0620  09/29/15 0015  09/30/15 0322 10/01/15 0331 10/02/15 0520 10/03/15 0500 10/04/15 0500  NA 146*  < > 146*  < > 147* 145 147* 144 146*  K 3.7  < > 3.4*  < > 4.1 3.5 4.3 4.4  4.8  CL 110  < > 113*  < > 116* 117* 115* 115* 113*  CO2 24  < > 21*  < > 21* 19* 21* 22 24  GLUCOSE 304*  < > 164*  < > 118* 106* 149* 248* 224*  BUN 15  < > 15  < > 20 24* 27* 29* 30*  CREATININE 1.00  < > 0.71  < > 1.13 1.09 0.94 1.03 0.99  CALCIUM 7.0*  < > 8.7*  < > 8.5* 8.5* 8.6* 8.2* 8.3*  MG 1.7  --  1.6*  --  1.8 2.0  --   --   --   PHOS  --   --  1.7*  --  3.7 3.5  --   --   --   < > = values in this interval not displayed.  Liver Function Tests:  Recent Labs Lab 09/28/15 0410  AST 26  ALT 14*  ALKPHOS 133*  BILITOT 1.0  PROT 7.1  ALBUMIN 3.1*   No results for input(s): LIPASE, AMYLASE in the last 168 hours. No results for input(s): AMMONIA in the last 168 hours.  CBC:  Recent Labs Lab 09/29/15 0640 09/30/15 0322 10/01/15 0331 10/03/15 0500 10/04/15 0500  WBC 6.5 9.9 6.1 9.5 10.8*  HGB 11.0* 11.5* 13.9 8.5* 7.8*  HCT 33.4* 35.4* 42.3 28.1* 27.0*  MCV 75.7* 76.3* 77.0* 80.3 81.3  PLT 210 218 136* 165 227    Cardiac Enzymes:  Recent Labs Lab 09/28/15 0645 09/28/15 1240 09/28/15 1700  TROPONINI 30.84* 60.50* >65.00*     Lipid Panel: No results for input(s): CHOL, TRIG, HDL, CHOLHDL, VLDL, LDLCALC in the last 168 hours.  CBG:  Recent Labs Lab 10/04/15 0715 10/04/15 1105 10/04/15 1510 10/04/15 2008 10/05/15 0039  GLUCAP 134* 140* 98 113* 133*    Microbiology: Results for orders placed or performed during the hospital encounter of 09/28/15  MRSA PCR Screening     Status: None   Collection Time: 09/28/15  6:03 AM  Result Value Ref Range Status   MRSA by PCR NEGATIVE NEGATIVE Final    Comment:        The GeneXpert MRSA Assay (FDA approved for NASAL specimens only), is one component of a comprehensive MRSA colonization surveillance program. It is not intended to diagnose MRSA infection nor to guide or monitor treatment for MRSA infections.   Culture, respiratory (NON-Expectorated)     Status: None   Collection Time: 09/28/15 10:41 AM  Result Value Ref Range Status   Specimen Description TRACHEAL ASPIRATE  Final   Special Requests NONE  Final   Gram Stain   Final    RARE WBC PRESENT,BOTH PMN AND MONONUCLEAR NO SQUAMOUS EPITHELIAL CELLS SEEN NO ORGANISMS SEEN Performed at [facility]    Culture   Final    NORMAL OROPHARYNGEAL FLORA Performed at [facility]    Report Status 10/01/2015 FINAL  Final    Coagulation Studies: No results for input(s): LABPROT, INR in the last 72 hours.  Imaging: Ct Head Wo Contrast  10/04/2015  CLINICAL DATA:  New left-sided weakness. EXAM: CT HEAD WITHOUT CONTRAST TECHNIQUE: Contiguous axial images were obtained from the base of the skull through the vertex without intravenous contrast. COMPARISON:  10/01/2015 FINDINGS: Skull and Sinuses:No acute osseous finding. Nasopharyngeal fluid in the setting of intubation. Sphenoid sinusitis. Visualized orbits: Right cataract resection and probable glaucoma drainage device. Brain: No evidence of cortical infarction, hemorrhage, hydrocephalus, or mass lesion/mass effect. Small areas of  centrum  semiovale low-density bilaterally is likely small vessel ischemic change but the finding is better/newly seen on the right. IMPRESSION: 1. No hemorrhage or cortical infarct. 2. Small area of right frontal white matter low density would usually reflect chronic microvascular disease but is newly seen, raising the possibility of interval white matter infarct. Electronically Signed   By: Marnee Spring M.D.   On: 10/04/2015 03:33   Mr Brain Wo Contrast  10/05/2015  CLINICAL DATA:  Cardiac arrest, respiratory failure. Ventilated patient, not moving LEFT arm. Evaluate hemiplegia. EXAM: MRI HEAD WITHOUT CONTRAST TECHNIQUE: Multiplanar, multiecho pulse sequences of the brain and surrounding structures were obtained without intravenous contrast. COMPARISON:  CT head October 04, 2015 0304 hours FINDINGS: Focal 13 x 9 mm area of reduced diffusion LEFT posterior frontal deep white matter, with corresponding low ADC value. No susceptibility artifact to suggest hemorrhage. Intermediate FLAIR T2 hyperintense signal along RIGHT temporal horn. A few scattered supratentorial subcentimeter white matter FLAIR T2 hyperintensities. Tiny old cerebellar infarcts. Small LEFT caudate head lacunar infarct. No abnormal extra-axial fluid collections. Normal major intracranial vascular flow voids present at skull base. Status post RIGHT ocular lens implant in mild susceptibility artifact, likely representing glaucoma drainage implant. No abnormal sellar expansion. No cerebellar tonsillar ectopia. No suspicious calvarial bone marrow signal. Life-support lines in place. Mild paranasal sinus mucosal thickening with bilateral mastoid effusions. IMPRESSION: Acute small RIGHT for frontal lobe white matter infarct. Mild white matter changes compatible with chronic small vessel ischemic disease. 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 Abnormal RIGHT temporal periventricular signal favoring old ischemia and gliosis though somewhat  atypical in location. Recommend follow-up MRI of the brain with contrast on a nonemergent basis. Electronically Signed   By: Awilda Metro M.D.   On: 10/05/2015 00:54   Dg Chest Port 1 View  10/03/2015  CLINICAL DATA:  Respiratory failure EXAM: PORTABLE CHEST 1 VIEW COMPARISON:  10/01/2015 FINDINGS: Endotracheal tube terminates 4.5 cm above the carina. Cardiomegaly with pulmonary vascular congestion. No frank interstitial edema. No pleural effusion or pneumothorax. Enteric tube courses into the mid stomach. IMPRESSION: Endotracheal tube terminates 4.5 cm above the carina. Cardiomegaly with pulmonary vascular congestion. No frank interstitial edema. Electronically Signed   By: Charline Bills M.D.   On: 10/03/2015 09:23      Assessment: 65 y.o. male who presented with PEA arrest found to have acute STEMI, s/p PCI of LAD and post hypothermia protocol found to have L sided weakness and MRI showing new acute small R frontal lobe infract  Heavily sedated while assessed overnight, on 400mg  fetanyl gtt and precedex. Will need to be weaned down for better assessment.  - Will need to complete stroke workup - On ASA and berlinta  - CTA head and neck to assess vessel imagining - Lipid profile, A1c1. HgbA1c, fasting lipid panel -  PT consult, OT consult, Speech consult -  Echocardiogram shows EF 10-15% -  Carotid dopplers -  Risk factor modification - Telemetry monitoring

## 2015-10-05 NOTE — Progress Notes (Signed)
STROKE TEAM PROGRESS NOTE   HISTORY OF PRESENT ILLNESS Eddie [name] is an 65 y.o. male who presented for PEA arrest, was placed on hypothermia protocol, found to have acute STEMI and had PCI-DES of LAD. Upon waking up post hypothermia was noted to have some mild L sided weakness and a CTH and MRI were preformed. MRI shows small acute R frontal lobe infract. Patient was not administered IV t-PA secondary to unknown, onset.    SUBJECTIVE (INTERVAL HISTORY) His RN is at the bedside. He is still on heavy sedation and not following peripheral commands but questionable following central commands. His MRI findings not able to explain his mental status and also hard to contribute fully of the left UE and LE severe weakness, may related at least partially with the mental status.    OBJECTIVE Temp:  [99.3 F (37.4 C)-102.8 F (39.3 C)] 102.8 F (39.3 C) (03/14 1200) Pulse Rate:  [62-148] 110 (03/14 1219) Cardiac Rhythm:  [-] Sinus tachycardia (03/14 0800) Resp:  [12-29] 19 (03/14 1219) BP: (90-241)/(59-107) 136/107 mmHg (03/14 1200) SpO2:  [94 %-100 %] 94 % (03/14 1219) FiO2 (%):  [40 %-50 %] 40 % (03/14 0810) Weight:  [71.5 kg (157 lb 10.1 oz)] 71.5 kg (157 lb 10.1 oz) (03/14 0400)  CBC:   Recent Labs Lab 10/04/15 0500 10/05/15 0415  WBC 10.8* 12.0*  HGB 7.8* 7.6*  HCT 27.0* 25.9*  MCV 81.3 81.4  PLT 227 226    Basic Metabolic Panel:  Recent Labs Lab 09/30/15 0322 10/01/15 0331  10/04/15 0500 10/05/15 0415  NA 147* 145  < > 146* 148*  K 4.1 3.5  < > 4.8 4.5  CL 116* 117*  < > 113* 112*  CO2 21* 19*  < > 24 26  GLUCOSE 118* 106*  < > 224* 110*  BUN 20 24*  < > 30* 27*  CREATININE 1.13 1.09  < > 0.99 0.99  CALCIUM 8.5* 8.5*  < > 8.3* 8.4*  MG 1.8 2.0  --   --   --   PHOS 3.7 3.5  --   --   --   < > = values in this interval not displayed.  Lipid Panel: No results found for: CHOL, TRIG, HDL, CHOLHDL, VLDL, LDLCALC HgbA1c:  Lab Results  Component Value Date   HGBA1C 7.5*  09/28/2015   Urine Drug Screen: No results found for: LABOPIA, COCAINSCRNUR, LABBENZ, AMPHETMU, THCU, LABBARB    IMAGING I have personally reviewed the radiological images below and agree with the radiology interpretations.  Ct Head Wo Contrast 10/04/2015   1. No hemorrhage or cortical infarct. 2. Small area of right frontal white matter low density would usually reflect chronic microvascular disease but is newly seen, raising the possibility of interval white matter infarct.   Mr Brain Wo Contrast 10/05/2015   Acute small RIGHT for frontal lobe white matter infarct. Mild white matter changes compatible with chronic small vessel ischemic disease. 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 Abnormal RIGHT temporal periventricular signal favoring old ischemia and gliosis though somewhat atypical in location. Recommend follow-up MRI of the brain with contrast on a nonemergent basis.   Dg Chest Port 1 View 10/05/2015  I'll use aneed to bring arrangement? And then going to be old, bypasses? Satisfactory line and tube positions. Diffuse BILATERAL pulmonary infiltrates significantly increased since previous exam question pulmonary edema though multifocal pneumonia not excluded.   2-D echocardiogram Left ventricle: The cavity size was moderately dilated. Wall  thickness was increased in a pattern of mild LVH. Systolic function was severely reduced. The estimated ejection fraction was in the range of 10% to 15%. of the mid-apicalanteroseptal and inferior myocardium; consistent with infarction. Doppler parameters are consistent with abnormal left ventricular relaxation (grade 1 diastolic dysfunction).  Ct Angio Head and neck W/cm &/or Wo Cm  10/05/2015  IMPRESSION: 1. Very mild intracranial and extracranial atherosclerosis without evidence of major vessel occlusion or significant stenosis. 2. Partially visualized bilateral pleural effusions and extensive bilateral lung consolidation which may  represent ARDS.    Dg Chest Port 1 View  10/05/2015  IMPRESSION: Satisfactory line and tube positions. Diffuse BILATERAL pulmonary infiltrates significantly increased since previous exam question pulmonary edema though multifocal pneumonia not excluded.  Repeat 2D echo pending  One hour EEG 10/01/15 - This is an abnormal routine EEG of the sedated state due to generalized background slowing. This is indicative of diffuse cerebral dysfunction which may be due to pharmacologic, hypoxic, toxic-metabolic or other diffuse physiologic etiology.   PHYSICAL EXAM  Temp:  [99.2 F (37.3 C)-102.8 F (39.3 C)] 99.2 F (37.3 C) (03/14 1522) Pulse Rate:  [62-148] 101 (03/14 1800) Resp:  [12-29] 17 (03/14 1800) BP: (90-193)/(59-107) 148/75 mmHg (03/14 1800) SpO2:  [94 %-100 %] 99 % (03/14 1800) FiO2 (%):  [40 %-50 %] 40 % (03/14 1558) Weight:  [157 lb 10.1 oz (71.5 kg)] 157 lb 10.1 oz (71.5 kg) (03/14 0400)  General - thin built, well developed, intubated and on sedation with fentanyl drip and precedex.  Ophthalmologic - Fundi not visualized due to keep closing eyes.  Cardiovascular - Regular rate and rhythm.  Neuro - both eyes open, intubated and on sedation, not able to follow peripheral commands, but questionable following commands for eye close and opening. Pupil equal size, left no light reflex and right brisk to light, eyes fixed in mid position. Positive corneals and gag. Spontaneous movement of RUE and RLE, barely against gravity. LUE no movement and LLE trace withdraw on pain stimulation. No babinski and DTR 1+. Sensation, coordination and gait not able to test.   ASSESSMENT/PLAN [name] is a 65 y.o. male with history of recent PEA arrest s/p PCI-DES of the LAD who woke post hypothermia with left-sided weakness. MRI showed a small right frontal lobe white matter infarct. He did not receive IV t-PA due to unknown last known well.   Altered mental status - out of proportion to the  MRI brain findings. The small right MCA/ACA and punctate left MCA/ACA infarcts can not explain the mental status, and also difficult to fully explain the left UE and LE weakness. IV sedation, fever, ARDS, cardiomyopathy, ICU delirium are likely contributing factors  Not able to explained by MRI   Repeat EEG  TTE EF 10-15% one week ago, will repeat 2D echo  Treat ARDS as per primary team  Continue vanco/fortaz coverage as per primary team  Blood culture if fever spike  Check UA to rule out UTI  Wean off sedation as able  Stroke:  small right frontal lobe WM infarct and punctate left frontal cortical infarct, embolic pattern likely due to STEMI, low EF 10-15%, post cardiac arrest and cardiac cath. However, the small and punctate infarct can not explain pt mental status and severe left hemiplegia. Need reassess off sedation and after extubation.  Resultant  left hemiplegia.  MRI  Small right frontal lobe WM infarct and punctate left frontal cortical infarct  CT angiogram of head and neck -  negative but found ARDS  2D Echo  EF 10-15% No source of embolus seen - will repeat TTE  LDL 99  HgbA1c pending  Heparin 5000 units sq tid for VTE prophylaxis Diet NPO time specified  No antithrombotic prior to admission, now on aspirin 81 mg daily and Brilinta 90 mg daily for STEMI  Patient counseled to be compliant with his antithrombotic medications  Ongoing aggressive stroke risk factor management  Therapy recommendations:  pending  Disposition:  pending   Hyperglycemia   HgbA1c pending, goal < 7.0  Other Stroke Risk Factors  Cigarette smoker, need to stop smoking  Coronary artery disease - admitted with PEA arrest with acute STEMI s/p PCI-DES of LAD  Other Active Problems  STEMI  Acute systolic heart failure  Acute respiratory failure from pulmonary edema, HCAP  Hypokalemia  Hypernatremia  Anemia of critical illness  Hospital day # 7  This patient is  critically ill due to AMS, ARDS, STEMI, cardiomyopathy and at significant risk of neurological worsening, death form recurrent strokes, ARDS, heart failure, sepsis and seizure. This patient's care requires constant monitoring of vital signs, hemodynamics, respiratory and cardiac monitoring, review of multiple databases, neurological assessment, discussion with family, other specialists and medical decision making of high complexity. I spent 45 minutes of neurocritical care time in the care of this patient.  Marvel Plan, MD PhD Stroke Neurology 10/05/2015 6:43 PM    To contact Stroke Continuity provider, please refer to [email or web address]. After hours, contact General Neurology

## 2015-10-05 NOTE — Progress Notes (Signed)
eLink Physician-Brief Progress Note Patient Name: Eddie [name] DOB: [birth year] MRN: [id number]   Date of Service  10/05/2015  HPI/Events of Note  MRI performed  eICU Interventions  Notify neurology for formal consultation     Intervention Category Intermediate Interventions: Communication with other healthcare providers and/or family  Max Fickle 10/05/2015, 12:56 AM

## 2015-10-05 NOTE — Progress Notes (Signed)
PULMONARY / CRITICAL CARE MEDICINE   Name: Eddie [name] MRN: [id number] DOB: 08/26/1950    ADMISSION DATE:  09/28/2015 CONSULTATION DATE:  09/28/15  REFERRING MD:  Antoine Poche  CHIEF COMPLAINT:  Cardiac Arrest  SUBJECTIVE:  Didn't tolerate SBT >> no respiratory efforts.  VITAL SIGNS: BP 110/64 mmHg  Pulse 85  Temp(Src) 100.4 F (38 C) (Axillary)  Resp 14  Ht 5\' 7"  (1.702 m)  Wt 157 lb 10.1 oz (71.5 kg)  BMI 24.68 kg/m2  SpO2 98%  VENTILATOR SETTINGS: Vent Mode:  [-] PRVC FiO2 (%):  [40 %-50 %] 40 % Set Rate:  [14 bmp] 14 bmp Vt Set:  [550 mL] 550 mL PEEP:  [5 cmH20] 5 cmH20 Plateau Pressure:  [21 cmH20-28 cmH20] 24 cmH20  INTAKE / OUTPUT: I/O last 3 completed shifts: In: 3417.8 [I.V.:2125.8; NG/GT:892; IV Piggyback:400] Out: 2675 [Urine:2675]   PHYSICAL EXAMINATION: General: sedated Neuro: RASS -2 HEENT: ETT in place Cardiac: regular Chest: b/l rhonchi Abd: soft, non tender Ext: no edema Skin: no rashes  LABS:  BMET  Recent Labs Lab 10/03/15 0500 10/04/15 0500 10/05/15 0415  NA 144 146* 148*  K 4.4 4.8 4.5  CL 115* 113* 112*  CO2 22 24 26   BUN 29* 30* 27*  CREATININE 1.03 0.99 0.99  GLUCOSE 248* 224* 110*    Electrolytes  Recent Labs Lab 09/29/15 0015  09/30/15 0322 10/01/15 0331  10/03/15 0500 10/04/15 0500 10/05/15 0415  CALCIUM 8.7*  < > 8.5* 8.5*  < > 8.2* 8.3* 8.4*  MG 1.6*  --  1.8 2.0  --   --   --   --   PHOS 1.7*  --  3.7 3.5  --   --   --   --   < > = values in this interval not displayed.  CBC  Recent Labs Lab 10/03/15 0500 10/04/15 0500 10/05/15 0415  WBC 9.5 10.8* 12.0*  HGB 8.5* 7.8* 7.6*  HCT 28.1* 27.0* 25.9*  PLT 165 227 226    Coag's  Recent Labs Lab 09/28/15 1000 09/28/15 1245  APTT 66* 43*  INR 2.21* 1.41    Sepsis Markers  Recent Labs Lab 09/28/15 1032  LATICACIDVEN 2.5*    ABG  Recent Labs Lab 09/30/15 0253 10/01/15 0339  PHART 7.426 7.494*  PCO2ART 34.2* 26.8*  PO2ART 104.0*  138.0*    Liver Enzymes  Recent Labs Lab 10/05/15 0415  AST 16  ALT 10*  ALKPHOS 72  BILITOT 1.1  ALBUMIN 1.9*    Cardiac Enzymes  Recent Labs Lab 09/28/15 1240 09/28/15 1700  TROPONINI 60.50* >65.00*    Glucose  Recent Labs Lab 10/04/15 1105 10/04/15 1510 10/04/15 2008 10/05/15 0039 10/05/15 0412 10/05/15 0759  GLUCAP 140* 98 113* 133* 107* 159*    Imaging Mr Brain Wo Contrast  10/05/2015  CLINICAL DATA:  Cardiac arrest, respiratory failure. Ventilated patient, not moving LEFT arm. Evaluate hemiplegia. EXAM: MRI HEAD WITHOUT CONTRAST TECHNIQUE: Multiplanar, multiecho pulse sequences of the brain and surrounding structures were obtained without intravenous contrast. COMPARISON:  CT head October 04, 2015 0304 hours FINDINGS: Focal 13 x 9 mm area of reduced diffusion LEFT posterior frontal deep white matter, with corresponding low ADC value. No susceptibility artifact to suggest hemorrhage. Intermediate FLAIR T2 hyperintense signal along RIGHT temporal horn. A few scattered supratentorial subcentimeter white matter FLAIR T2 hyperintensities. Tiny old cerebellar infarcts. Small LEFT caudate head lacunar infarct. No abnormal extra-axial fluid collections. Normal major intracranial vascular flow voids present at skull base.  Status post RIGHT ocular lens implant in mild susceptibility artifact, likely representing glaucoma drainage implant. No abnormal sellar expansion. No cerebellar tonsillar ectopia. No suspicious calvarial bone marrow signal. Life-support lines in place. Mild paranasal sinus mucosal thickening with bilateral mastoid effusions. IMPRESSION: Acute small RIGHT for frontal lobe white matter infarct. Mild white matter changes compatible with chronic small vessel ischemic disease. 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 Abnormal RIGHT temporal periventricular signal favoring old ischemia and gliosis though somewhat atypical in location. Recommend follow-up  MRI of the brain with contrast on a nonemergent basis. Electronically Signed   By: Awilda Metro M.D.   On: 10/05/2015 00:54   Dg Chest Port 1 View  10/05/2015  CLINICAL DATA:  Respiratory failure, smoker EXAM: PORTABLE CHEST 1 VIEW COMPARISON:  Portable exam 0522 hours compared to 10/03/2015 FINDINGS: Tip of endotracheal tube projects 3.7 cm above carina. Nasogastric tube extends into stomach. LEFT jugular central venous catheter tip projects over SVC. Enlargement of cardiac silhouette. Diffuse BILATERAL pulmonary infiltrates increased since previous exam likely pulmonary edema though infection is not excluded. No pleural effusion or pneumothorax. Bones unremarkable. IMPRESSION: Satisfactory line and tube positions. Diffuse BILATERAL pulmonary infiltrates significantly increased since previous exam question pulmonary edema though multifocal pneumonia not excluded. Electronically Signed   By: Ulyses Southward M.D.   On: 10/05/2015 08:12     STUDIES:  Cardiac Cath 03/07 > 100% LAD occlusion > DES placed Echo 3/07 >> EF 10 to 15%, grade 1 diastolic  CT head 3/10 >> no acute findings EEG 3/10 >> generalized slowing CT head 3/13 >> Rt frontal white matter low density MRI brain 3/14 >> acute small Rt frontal infarct, old Lt caudate and cerebellar infarcts   CULTURES: 3/07 Sputum >> oral flora 3/13 Sputum >> GPC in pairs/clusters >>  ANTIBIOTICS: 3/13 Vancomycin >> 3/13 Fortaz >>  SIGNIFICANT EVENTS: 03/07 PEA arrest > taken to cath lab then returned to ICU on vent  > hypothermia started  3/13 Possible CVA >> neuro consulted, increased respiratory secretions >> started ABx  LINES/TUBES: ETT 03/07 > Lt IJ CVL  03/07 > A line 03/07 > 3/11  DISCUSSION: 65 yo male with dyspnea, chest pain from STEMI >> cardiac arrest in ER with 6 minutes before ROSC.  Had PCI with DES to LAD.  ASSESSMENT / PLAN:  CARDIOVASCULAR A:  STEMI. Acute systolic CHF. P:  ASA, coreg, lisinopril, brilinta Defer  lipid lowering agents to cardiology  PULMONARY A: Acute respiratory failure from pulmonary edema, HCAP. P:   Full vent support F/u CXR  RENAL A:   Hypokalemia >> improved. Hypernatremia. P:   Correct electrolytes as indicated Free water  GASTROINTESTINAL A:   Hx PUD. Nutrition. P:   Protonix for SUP Try to resume tube feeds 3/14  HEMATOLOGIC A:   Anemia of critical illness >> no evidence for bleeding. P:  F/u CBC SQ heparin  INFECTIOUS A:   HCAP. P:   Day 2 vancomycin, fortaz  ENDOCRINE A:   Hyperglycemia - no hx of DM.    P:   SSI with lantus  NEUROLOGIC A:   Acute encephalopathy cardiac arrest. Acute CVA 3/13. P:   RASS goal 0 F/u CT angio neck  D/w Dr. Royann Shivers  CC time 33 minutes.  Coralyn Helling, MD [facility] Pulmonary/Critical Care 10/05/2015, 9:44 AM Pager:  [phone] After 3pm call: [phone]

## 2015-10-05 NOTE — Progress Notes (Signed)
Patient Name: Eddie [name] Date of Encounter: 10/05/2015  Active Problems:   Cardiac arrest (HCC)   ST elevation myocardial infarction (STEMI) (HCC)   STEMI (ST elevation myocardial infarction) (HCC)   Encounter for central line placement   Hypokalemia   Encephalopathy acute   Acute hypoxemic respiratory failure (HCC)   Acute respiratory failure (HCC)   Length of Stay: 7  SUBJECTIVE  Very agitated overnight, now sedated heavily for CT angio of head & neck. Unable to assess neuro status. Breathing comfortable (on vent, low minute ventilation, low O2 requirements). Hemodynamics stable. Gaining fluid, weight creeping up, but good UO. Normal renal function. Plans to wean vent and possibly extubate.  CURRENT MEDS . antiseptic oral rinse  7 mL Mouth Rinse 10 times per day  . aspirin  81 mg Per Tube Daily  . carvedilol  3.125 mg Per Tube BID WC  . cefTAZidime (FORTAZ)  IV  1 g Intravenous Q8H  . chlorhexidine gluconate  15 mL Mouth Rinse BID  . heparin  5,000 Units Subcutaneous 3 times per day  . insulin aspart  2-6 Units Subcutaneous 6 times per day  . insulin glargine  5 Units Subcutaneous QHS  . lisinopril  2.5 mg Oral Daily  . pantoprazole sodium  40 mg Per Tube Daily  . ticagrelor  90 mg Oral BID  . vancomycin  1,000 mg Intravenous Q12H    OBJECTIVE   Intake/Output Summary (Last 24 hours) at 10/05/15 0853 Last data filed at 10/05/15 0800  Gross per 24 hour  Intake 2040.13 ml  Output   1575 ml  Net 465.13 ml   Filed Weights   10/03/15 0424 10/04/15 0500 10/05/15 0400  Weight: 69.9 kg (154 lb 1.6 oz) 70.9 kg (156 lb 4.9 oz) 71.5 kg (157 lb 10.1 oz)    PHYSICAL EXAM Filed Vitals:   10/05/15 0750 10/05/15 0800 10/05/15 0810 10/05/15 0836  BP:  148/77 148/77 148/77  Pulse: 148 101 95 88  Temp:      TempSrc:      Resp: Height:      Weight:      SpO2: 96% 98% 95%    General: sedated, intubated Head: no evidence of trauma, PERRL, EOMI, no  exophtalmos or lid lag, no myxedema, no xanthelasma; normal ears, nose and oropharynx Neck: normal jugular venous pulsations and no hepatojugular reflux; brisk carotid pulses without delay and no carotid bruits Chest: clear to auscultation, no signs of consolidation by percussion or palpation, normal fremitus, symmetrical and full respiratory excursions Cardiovascular: normal position and quality of the apical impulse, regular rhythm, normal first and second heart sounds, no rubs or gallops, no murmur Abdomen: no tenderness or distention, no masses by palpation, no abnormal pulsatility or arterial bruits, normal bowel sounds, no hepatosplenomegaly Extremities: no clubbing, cyanosis or edema; 2+ radial, ulnar and brachial pulses bilaterally; 2+ right femoral, posterior tibial and dorsalis pedis pulses; 2+ left femoral, posterior tibial and dorsalis pedis pulses; no subclavian or femoral bruits Neurological: not moving left arm/hand  LABS  CBC  Recent Labs  10/04/15 0500 10/05/15 0415  WBC 10.8* 12.0*  HGB 7.8* 7.6*  HCT 27.0* 25.9*  MCV 81.3 81.4  PLT 227 226   Basic Metabolic Panel  Recent Labs  10/04/15 0500 10/05/15 0415  NA 146* 148*  K 4.8 4.5  CL 113* 112*  CO2 24 26  GLUCOSE 224* 110*  BUN 30* 27*  CREATININE 0.99 0.99  CALCIUM 8.3* 8.4*  Liver Function Tests  Recent Labs  10/05/15 0415  AST 16  ALT 10*  ALKPHOS 72  BILITOT 1.1  PROT 5.8*  ALBUMIN 1.9*    Radiology Studies Imaging results have been reviewed and Ct Head Wo Contrast  10/04/2015  CLINICAL DATA:  New left-sided weakness. EXAM: CT HEAD WITHOUT CONTRAST TECHNIQUE: Contiguous axial images were obtained from the base of the skull through the vertex without intravenous contrast. COMPARISON:  10/01/2015 FINDINGS: Skull and Sinuses:No acute osseous finding. Nasopharyngeal fluid in the setting of intubation. Sphenoid sinusitis. Visualized orbits: Right cataract resection and probable glaucoma drainage  device. Brain: No evidence of cortical infarction, hemorrhage, hydrocephalus, or mass lesion/mass effect. Small areas of centrum semiovale low-density bilaterally is likely small vessel ischemic change but the finding is better/newly seen on the right. IMPRESSION: 1. No hemorrhage or cortical infarct. 2. Small area of right frontal white matter low density would usually reflect chronic microvascular disease but is newly seen, raising the possibility of interval white matter infarct. Electronically Signed   By: Marnee Spring M.D.   On: 10/04/2015 03:33   Mr Brain Wo Contrast  10/05/2015  CLINICAL DATA:  Cardiac arrest, respiratory failure. Ventilated patient, not moving LEFT arm. Evaluate hemiplegia. EXAM: MRI HEAD WITHOUT CONTRAST TECHNIQUE: Multiplanar, multiecho pulse sequences of the brain and surrounding structures were obtained without intravenous contrast. COMPARISON:  CT head October 04, 2015 0304 hours FINDINGS: Focal 13 x 9 mm area of reduced diffusion LEFT posterior frontal deep white matter, with corresponding low ADC value. No susceptibility artifact to suggest hemorrhage. Intermediate FLAIR T2 hyperintense signal along RIGHT temporal horn. A few scattered supratentorial subcentimeter white matter FLAIR T2 hyperintensities. Tiny old cerebellar infarcts. Small LEFT caudate head lacunar infarct. No abnormal extra-axial fluid collections. Normal major intracranial vascular flow voids present at skull base. Status post RIGHT ocular lens implant in mild susceptibility artifact, likely representing glaucoma drainage implant. No abnormal sellar expansion. No cerebellar tonsillar ectopia. No suspicious calvarial bone marrow signal. Life-support lines in place. Mild paranasal sinus mucosal thickening with bilateral mastoid effusions. IMPRESSION: Acute small RIGHT for frontal lobe white matter infarct. Mild white matter changes compatible with chronic small vessel ischemic disease. Old LEFT caudate head and old  small cerebellar infarcts. Abnormal RIGHT temporal periventricular signal favoring old ischemia and gliosis though somewhat atypical in location. Recommend follow-up MRI of the brain with contrast on a nonemergent basis. Electronically Signed   By: Awilda Metro M.D.   On: 10/05/2015 00:54   Dg Chest Port 1 View  10/05/2015  CLINICAL DATA:  Respiratory failure, smoker EXAM: PORTABLE CHEST 1 VIEW COMPARISON:  Portable exam 0522 hours compared to 10/03/2015 FINDINGS: Tip of endotracheal tube projects 3.7 cm above carina. Nasogastric tube extends into stomach. LEFT jugular central venous catheter tip projects over SVC. Enlargement of cardiac silhouette. Diffuse BILATERAL pulmonary infiltrates increased since previous exam likely pulmonary edema though infection is not excluded. No pleural effusion or pneumothorax. Bones unremarkable. IMPRESSION: Satisfactory line and tube positions. Diffuse BILATERAL pulmonary infiltrates significantly increased since previous exam question pulmonary edema though multifocal pneumonia not excluded. Electronically Signed   By: Ulyses Southward M.D.   On: 10/05/2015 08:12    TELE NSR  ECG Pending repeat today  ASSESSMENT AND PLAN  1. S/P cardiac arrest - witnessed, relatively short resuscitation, had early cooling. Would expect reasonably good neuro outcome, but there is evidence of a new stroke. 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 2. S/p anterior STEMI and PCI-DES - have  to continue ASA/ticagrelor. On low dose carvedilol. Will increase ACE inh., but use low dose to avoid hypotension in face of possible recent CVA. Renal function is normal. 3. Acute systolic heart failure - EF of 10-15% immediately post MI/arrest may still improve. Note LV is already dilated, suggesting some degree of chronic cardiomyopathy. Will give one dose of diuretic to assist with weaning plans. 3. Ventilator-dependent respiratory failure. Defer weaning attempts to Dr. Craige Cotta. He  is a smoker.  4. New CVA (right frontal white matter infarct). Source could be cardioembolic in view of severe LV dysfunction, but defect is small and subcortical. CT angio pending review  5. Anemia - sudden drop in Hgb without overt bleeding without hemodynamic instability, melena or hematemesis. Elevated haptoglobin excludes hemolysis. No physical findings to suggest retroperitoneal hematoma, drop in Hgb occurred between 3/10 and 3/12 (cath on 3/7).  If Hgb < 7, would transfuse. 6. Hypernatremia - add free water via NGT   Thurmon Fair, MD, [facility] [phone] office [phone] pager 10/05/2015 8:53 AM

## 2015-10-06 ENCOUNTER — Inpatient Hospital Stay (HOSPITAL_COMMUNITY): Payer: BLUE CROSS/BLUE SHIELD

## 2015-10-06 ENCOUNTER — Other Ambulatory Visit (HOSPITAL_COMMUNITY): Payer: Self-pay

## 2015-10-06 DIAGNOSIS — G8190 Hemiplegia, unspecified affecting unspecified side: Secondary | ICD-10-CM

## 2015-10-06 DIAGNOSIS — J810 Acute pulmonary edema: Secondary | ICD-10-CM

## 2015-10-06 DIAGNOSIS — E870 Hyperosmolality and hypernatremia: Secondary | ICD-10-CM

## 2015-10-06 DIAGNOSIS — I6789 Other cerebrovascular disease: Secondary | ICD-10-CM

## 2015-10-06 DIAGNOSIS — D649 Anemia, unspecified: Secondary | ICD-10-CM | POA: Insufficient documentation

## 2015-10-06 LAB — BASIC METABOLIC PANEL
ANION GAP: 7 (ref 5–15)
BUN: 29 mg/dL — AB (ref 6–20)
CO2: 29 mmol/L (ref 22–32)
Calcium: 8 mg/dL — ABNORMAL LOW (ref 8.9–10.3)
Chloride: 110 mmol/L (ref 101–111)
Creatinine, Ser: 1.06 mg/dL (ref 0.61–1.24)
GFR calc Af Amer: >60 mL/min (ref >60)
GFR calc non Af Amer: >60 mL/min (ref >60)
GLUCOSE: 225 mg/dL — AB (ref 65–99)
POTASSIUM: 3.4 mmol/L — AB (ref 3.5–5.1)
Sodium: 146 mmol/L — ABNORMAL HIGH (ref 135–145)

## 2015-10-06 LAB — POCT I-STAT 3, ART BLOOD GAS (G3+)
Acid-Base Excess: 5 mmol/L — ABNORMAL HIGH (ref 0.0–2.0)
Acid-Base Excess: 6 mmol/L — ABNORMAL HIGH (ref 0.0–2.0)
Bicarbonate: 29.5 mEq/L — ABNORMAL HIGH (ref 20.0–24.0)
Bicarbonate: 30.7 mEq/L — ABNORMAL HIGH (ref 20.0–24.0)
O2 Saturation: 98 %
O2 Saturation: 98 %
PCO2 ART: 41.5 mmHg (ref 35.0–45.0)
PH ART: 7.458 — AB (ref 7.350–7.450)
PO2 ART: 107 mmHg — AB (ref 80.0–100.0)
Patient temperature: 98.4
Patient temperature: 98.4
TCO2: 31 mmol/L (ref 0–100)
TCO2: 32 mmol/L (ref 0–100)
pCO2 arterial: 47.1 mmHg — ABNORMAL HIGH (ref 35.0–45.0)
pH, Arterial: 7.421 (ref 7.350–7.450)
pO2, Arterial: 108 mmHg — ABNORMAL HIGH (ref 80.0–100.0)

## 2015-10-06 LAB — HEMOGLOBIN A1C
Hgb A1c MFr Bld: 6.9 % — ABNORMAL HIGH (ref 4.8–5.6)
Mean Plasma Glucose: 151 mg/dL

## 2015-10-06 LAB — GLUCOSE, CAPILLARY
GLUCOSE-CAPILLARY: 188 mg/dL — AB (ref 65–99)
GLUCOSE-CAPILLARY: 137 mg/dL — AB (ref 65–99)
GLUCOSE-CAPILLARY: 269 mg/dL — AB (ref 65–99)
Glucose-Capillary: 129 mg/dL — ABNORMAL HIGH (ref 65–99)
Glucose-Capillary: 254 mg/dL — ABNORMAL HIGH (ref 65–99)
Glucose-Capillary: 257 mg/dL — ABNORMAL HIGH (ref 65–99)

## 2015-10-06 LAB — ECHOCARDIOGRAM LIMITED
Height: 67 in
Weight: 2391.55 oz

## 2015-10-06 LAB — CBC
HEMATOCRIT: 24.8 % — AB (ref 39.0–52.0)
HEMOGLOBIN: 7.4 g/dL — AB (ref 13.0–17.0)
MCH: 24 pg — ABNORMAL LOW (ref 26.0–34.0)
MCHC: 29.8 g/dL — ABNORMAL LOW (ref 30.0–36.0)
MCV: 80.5 fL (ref 78.0–100.0)
Platelets: 274 10*3/uL (ref 150–400)
RBC: 3.08 MIL/uL — AB (ref 4.22–5.81)
RDW: 13.3 % (ref 11.5–15.5)
WBC: 12.2 10*3/uL — AB (ref 4.0–10.5)

## 2015-10-06 LAB — TRIGLYCERIDES: TRIGLYCERIDES: 91 mg/dL (ref <150)

## 2015-10-06 MED ORDER — PERFLUTREN LIPID MICROSPHERE
INTRAVENOUS | Status: AC
  Filled 2015-10-06: qty 10

## 2015-10-06 MED ORDER — MIDAZOLAM HCL 5 MG/ML IJ SOLN
0.0000 mg/h | INTRAMUSCULAR | Status: DC
  Administered 2015-10-06: 2 mg/h via INTRAVENOUS
  Administered 2015-10-06 – 2015-10-07 (×3): 10 mg/h via INTRAVENOUS
  Administered 2015-10-07 – 2015-10-08 (×3): 5 mg/h via INTRAVENOUS
  Administered 2015-10-08: 2 mg/h via INTRAVENOUS
  Administered 2015-10-08: 5 mg/h via INTRAVENOUS
  Administered 2015-10-08: 2 mg/h via INTRAVENOUS
  Administered 2015-10-08: 5 mg/h via INTRAVENOUS
  Administered 2015-10-08: 2 mg/h via INTRAVENOUS
  Administered 2015-10-09: 5 mg/h via INTRAVENOUS
  Filled 2015-10-06 (×10): qty 10

## 2015-10-06 MED ORDER — HYDROMORPHONE HCL 1 MG/ML IJ SOLN
INTRAMUSCULAR | Status: AC
  Filled 2015-10-06: qty 1

## 2015-10-06 MED ORDER — HYDROMORPHONE HCL 1 MG/ML IJ SOLN
1.0000 mg | Freq: Once | INTRAMUSCULAR | Status: AC
  Administered 2015-10-06: 1 mg via INTRAVENOUS

## 2015-10-06 MED ORDER — PERFLUTREN LIPID MICROSPHERE
1.0000 mL | INTRAVENOUS | Status: AC | PRN
  Administered 2015-10-06: 2 mL via INTRAVENOUS
  Filled 2015-10-06: qty 10

## 2015-10-06 MED ORDER — MIDAZOLAM BOLUS VIA INFUSION
2.0000 mg | INTRAVENOUS | Status: DC | PRN
  Filled 2015-10-06: qty 2

## 2015-10-06 MED ORDER — ATORVASTATIN CALCIUM 20 MG PO TABS
20.0000 mg | ORAL_TABLET | Freq: Every day | ORAL | Status: DC
  Administered 2015-10-07 – 2015-10-08 (×2): 20 mg via ORAL
  Filled 2015-10-06 (×2): qty 1

## 2015-10-06 MED ORDER — FUROSEMIDE 10 MG/ML IJ SOLN
40.0000 mg | Freq: Four times a day (QID) | INTRAMUSCULAR | Status: AC
  Administered 2015-10-06 (×2): 40 mg via INTRAVENOUS
  Filled 2015-10-06 (×2): qty 4

## 2015-10-06 MED ORDER — ALTEPLASE 2 MG IJ SOLR
2.0000 mg | Freq: Once | INTRAMUSCULAR | Status: AC
  Administered 2015-10-06: 2 mg
  Filled 2015-10-06 (×2): qty 2

## 2015-10-06 MED ORDER — PROPOFOL 1000 MG/100ML IV EMUL
INTRAVENOUS | Status: AC
  Filled 2015-10-06: qty 100

## 2015-10-06 MED ORDER — PROPOFOL 1000 MG/100ML IV EMUL
0.0000 ug/kg/min | INTRAVENOUS | Status: DC
  Administered 2015-10-06: 10 ug/kg/min via INTRAVENOUS
  Administered 2015-10-06: 5 ug/kg/min via INTRAVENOUS
  Administered 2015-10-07: 18 ug/kg/min via INTRAVENOUS
  Administered 2015-10-08: 10 ug/kg/min via INTRAVENOUS
  Administered 2015-10-08: 15 ug/kg/min via INTRAVENOUS
  Administered 2015-10-09: 35 ug/kg/min via INTRAVENOUS
  Administered 2015-10-09: 20 ug/kg/min via INTRAVENOUS
  Administered 2015-10-09: 25 ug/kg/min via INTRAVENOUS
  Administered 2015-10-09 (×2): 40 ug/kg/min via INTRAVENOUS
  Administered 2015-10-10: 45 ug/kg/min via INTRAVENOUS
  Administered 2015-10-10: 35 ug/kg/min via INTRAVENOUS
  Administered 2015-10-10: 45 ug/kg/min via INTRAVENOUS
  Administered 2015-10-11 (×2): 35 ug/kg/min via INTRAVENOUS
  Filled 2015-10-06 (×14): qty 100

## 2015-10-06 MED ORDER — NOREPINEPHRINE BITARTRATE 1 MG/ML IV SOLN
2.0000 ug/min | INTRAVENOUS | Status: DC
  Administered 2015-10-06: 5 ug/min via INTRAVENOUS
  Administered 2015-10-07: 4 ug/min via INTRAVENOUS
  Filled 2015-10-06 (×2): qty 4

## 2015-10-06 MED ORDER — LISINOPRIL 10 MG PO TABS
10.0000 mg | ORAL_TABLET | Freq: Every day | ORAL | Status: DC
  Administered 2015-10-07: 10 mg via ORAL
  Filled 2015-10-06: qty 1

## 2015-10-06 MED ORDER — SENNOSIDES 8.8 MG/5ML PO SYRP
5.0000 mL | ORAL_SOLUTION | Freq: Two times a day (BID) | ORAL | Status: DC
  Administered 2015-10-06 – 2015-10-15 (×14): 5 mL via ORAL
  Filled 2015-10-06 (×28): qty 5

## 2015-10-06 MED ORDER — POTASSIUM CHLORIDE 20 MEQ/15ML (10%) PO SOLN
40.0000 meq | Freq: Four times a day (QID) | ORAL | Status: AC
  Administered 2015-10-06 (×2): 40 meq via ORAL
  Filled 2015-10-06 (×2): qty 30

## 2015-10-06 MED ORDER — INSULIN ASPART 100 UNIT/ML ~~LOC~~ SOLN
0.0000 [IU] | SUBCUTANEOUS | Status: DC
  Administered 2015-10-06: 8 [IU] via SUBCUTANEOUS
  Administered 2015-10-06: 2 [IU] via SUBCUTANEOUS
  Administered 2015-10-06: 8 [IU] via SUBCUTANEOUS
  Administered 2015-10-07: 5 [IU] via SUBCUTANEOUS
  Administered 2015-10-07: 3 [IU] via SUBCUTANEOUS
  Administered 2015-10-07 (×2): 5 [IU] via SUBCUTANEOUS
  Administered 2015-10-07: 3 [IU] via SUBCUTANEOUS
  Administered 2015-10-07: 8 [IU] via SUBCUTANEOUS
  Administered 2015-10-08 (×3): 5 [IU] via SUBCUTANEOUS
  Administered 2015-10-08: 3 [IU] via SUBCUTANEOUS
  Administered 2015-10-08: 15 [IU] via SUBCUTANEOUS
  Administered 2015-10-09 – 2015-10-10 (×7): 5 [IU] via SUBCUTANEOUS
  Administered 2015-10-10: 8 [IU] via SUBCUTANEOUS
  Administered 2015-10-10 (×2): 5 [IU] via SUBCUTANEOUS
  Administered 2015-10-10: 8 [IU] via SUBCUTANEOUS
  Administered 2015-10-10: 3 [IU] via SUBCUTANEOUS
  Administered 2015-10-11 (×2): 5 [IU] via SUBCUTANEOUS
  Administered 2015-10-11: 3 [IU] via SUBCUTANEOUS
  Administered 2015-10-11 (×2): 5 [IU] via SUBCUTANEOUS
  Administered 2015-10-11: 3 [IU] via SUBCUTANEOUS
  Administered 2015-10-12 (×2): 8 [IU] via SUBCUTANEOUS
  Administered 2015-10-12: 5 [IU] via SUBCUTANEOUS

## 2015-10-06 NOTE — Progress Notes (Signed)
  Echocardiogram 2D Echocardiogram has been performed.  Delcie Roch 10/06/2015, 11:47 AM

## 2015-10-06 NOTE — Progress Notes (Signed)
Spoke to RN and was told to hold off on EEG today that patient still under a lot of sedation.

## 2015-10-06 NOTE — Progress Notes (Signed)
PULMONARY / CRITICAL CARE MEDICINE   Name: Eddie [name] MRN: [id number] DOB: 11-08-1950    ADMISSION DATE:  09/28/2015 CONSULTATION DATE:  09/28/15  REFERRING MD:  Antoine Poche  CHIEF COMPLAINT:  Cardiac Arrest  SUBJECTIVE:  Didn't tolerate SBT >> hypoxia, tachypnea.  VITAL SIGNS: BP 182/80 mmHg  Pulse 123  Temp(Src) 98.4 F (36.9 C) (Oral)  Resp 31  Ht  (1.702 m)  Wt 149 lb 7.6 oz (67.8 kg)  BMI 23.41 kg/m2  SpO2 94%  VENTILATOR SETTINGS: Vent Mode:  [-] CPAP;PSV FiO2 (%):  [40 %] 40 % Set Rate:  [14 bmp] 14 bmp Vt Set:  [550 mL] 550 mL PEEP:  [5 cmH20] 5 cmH20 Pressure Support:  [5 cmH20] 5 cmH20 Plateau Pressure:  [17 cmH20-27 cmH20] 21 cmH20  INTAKE / OUTPUT: I/O last 3 completed shifts: In: 3964.9 [I.V.:1966.6; WU/JW:1191.4; IV Piggyback:550] Out: 3510 [Urine:3510]   PHYSICAL EXAMINATION: General: agitated Neuro: RASS +3 HEENT: ETT in place Cardiac: regular Chest: b/l diffuse crackles Abd: soft, non tender Ext: no edema Skin: no rashes  LABS:  BMET  Recent Labs Lab 10/04/15 0500 10/05/15 0415 10/06/15 0355  NA 146* 148* 146*  K 4.8 4.5 3.4*  CL 113* 112* 110  CO2 BUN 30* 27* 29*  CREATININE 0.99 0.99 1.06  GLUCOSE 224* 110* 225*    Electrolytes  Recent Labs Lab 09/30/15 0322 10/01/15 0331  10/04/15 0500 10/05/15 0415 10/06/15 0355  CALCIUM 8.5* 8.5*  < > 8.3* 8.4* 8.0*  MG 1.8 2.0  --   --   --   --   PHOS 3.7 3.5  --   --   --   --   < > = values in this interval not displayed.  CBC  Recent Labs Lab 10/04/15 0500 10/05/15 0415 10/06/15 0355  WBC 10.8* 12.0* 12.2*  HGB 7.8* 7.6* 7.4*  HCT 27.0* 25.9* 24.8*  PLT 227 226 274    Coag's No results for input(s): APTT, INR in the last 168 hours.  Sepsis Markers No results for input(s): LATICACIDVEN, PROCALCITON, O2SATVEN in the last 168 hours.  ABG  Recent Labs Lab 09/30/15 0253 10/01/15 0339  PHART 7.426 7.494*  PCO2ART 34.2* 26.8*  PO2ART 104.0*  138.0*    Liver Enzymes  Recent Labs Lab 10/05/15 0415  AST 16  ALT 10*  ALKPHOS 72  BILITOT 1.1  ALBUMIN 1.9*    Cardiac Enzymes No results for input(s): TROPONINI, PROBNP in the last 168 hours.  Glucose  Recent Labs Lab 10/05/15 1224 10/05/15 1529 10/05/15 2020 10/06/15 0047 10/06/15 0337 10/06/15 0737  GLUCAP 160* 158* 302* 254* 188* 137*    Imaging Ct Angio Head W/cm &/or Wo Cm  10/05/2015  CLINICAL DATA:  Acute posterior right frontal lobe infarct on MRI. EXAM: CT ANGIOGRAPHY HEAD AND NECK TECHNIQUE: Multidetector CT imaging of the head and neck was performed using the standard protocol during bolus administration of intravenous contrast. Multiplanar CT image reconstructions and MIPs were obtained to evaluate the vascular anatomy. Carotid stenosis measurements (when applicable) are obtained utilizing NASCET criteria, using the distal internal carotid diameter as the denominator. CONTRAST:  50mL OMNIPAQUE IOHEXOL 350 MG/ML SOLN COMPARISON:  Brain MRI and CT 10/04/2015. No prior angiographic imaging. FINDINGS: CT HEAD Brain: Small focus of hypoattenuation in the posterior right frontal lobe white matter corresponds to the acute infarct demonstrated on yesterday's MRI. There is no evidence of acute intracranial hemorrhage, mass, midline shift, or extra-axial fluid collection. Mild white  matter hypoattenuation adjacent to the right temporal horn as on MRI. Ventricles and sulci are within normal limits for age. Calvarium and skull base: No destructive osseous lesion. Paranasal sinuses: Mild mucosal thickening, most notable in the left sphenoid sinus. Orbits: Postoperative changes to the right globe. CTA NECK Aortic arch: 3 vessel aortic arch with mild atherosclerotic calcification. Brachiocephalic and subclavian arteries are widely patent with mild non stenotic plaque. Right carotid system: Patent with mild mixed calcified and noncalcified plaque at the carotid bifurcation and in the  proximal ICA. No significant stenosis. Left carotid system: Patent with minimal atherosclerotic luminal irregularity. No stenosis. Vertebral arteries:Patent and codominant without stenosis. Skeleton: Mild cervical spondylosis. Other neck: Poor dentition with multiple periapical lucencies and missing teeth. Endotracheal and enteric tubes with small volume pharyngeal fluid. Extensive consolidative opacities partially visualized in the lung apices. Pleural effusions partially visualized. Left jugular venous catheter terminating in the proximal SVC. CTA HEAD Anterior circulation: Internal carotid arteries are patent from skullbase to carotid termini. There is mild bilateral carotid siphon atherosclerosis without significant stenosis. ACAs and MCAs are patent without evidence of major branch occlusion or significant proximal stenosis. There is an early MCA bifurcation on the right. No intracranial aneurysm is identified. Posterior circulation: Intracranial vertebral arteries are widely patent to the vertebrobasilar junction. PICA, AICA, and SCA origins are patent. Basilar artery is patent without stenosis. Posterior communicating arteries are not clearly identified. PCAs are patent without evidence of significant stenosis. Venous sinuses: Patent. Anatomic variants: None. Delayed phase: No abnormal enhancement. IMPRESSION: 1. Very mild intracranial and extracranial atherosclerosis without evidence of major vessel occlusion or significant stenosis. 2. Partially visualized bilateral pleural effusions and extensive bilateral lung consolidation which may represent ARDS. Electronically Signed   By: Sebastian Ache M.D.   On: 10/05/2015 13:40   Ct Angio Neck W/cm &/or Wo/cm  10/05/2015  CLINICAL DATA:  Acute posterior right frontal lobe infarct on MRI. EXAM: CT ANGIOGRAPHY HEAD AND NECK TECHNIQUE: Multidetector CT imaging of the head and neck was performed using the standard protocol during bolus administration of intravenous  contrast. Multiplanar CT image reconstructions and MIPs were obtained to evaluate the vascular anatomy. Carotid stenosis measurements (when applicable) are obtained utilizing NASCET criteria, using the distal internal carotid diameter as the denominator. CONTRAST:  50mL OMNIPAQUE IOHEXOL 350 MG/ML SOLN COMPARISON:  Brain MRI and CT 10/04/2015. No prior angiographic imaging. FINDINGS: CT HEAD Brain: Small focus of hypoattenuation in the posterior right frontal lobe white matter corresponds to the acute infarct demonstrated on yesterday's MRI. There is no evidence of acute intracranial hemorrhage, mass, midline shift, or extra-axial fluid collection. Mild white matter hypoattenuation adjacent to the right temporal horn as on MRI. Ventricles and sulci are within normal limits for age. Calvarium and skull base: No destructive osseous lesion. Paranasal sinuses: Mild mucosal thickening, most notable in the left sphenoid sinus. Orbits: Postoperative changes to the right globe. CTA NECK Aortic arch: 3 vessel aortic arch with mild atherosclerotic calcification. Brachiocephalic and subclavian arteries are widely patent with mild non stenotic plaque. Right carotid system: Patent with mild mixed calcified and noncalcified plaque at the carotid bifurcation and in the proximal ICA. No significant stenosis. Left carotid system: Patent with minimal atherosclerotic luminal irregularity. No stenosis. Vertebral arteries:Patent and codominant without stenosis. Skeleton: Mild cervical spondylosis. Other neck: Poor dentition with multiple periapical lucencies and missing teeth. Endotracheal and enteric tubes with small volume pharyngeal fluid. Extensive consolidative opacities partially visualized in the lung apices. Pleural effusions partially visualized. Left  jugular venous catheter terminating in the proximal SVC. CTA HEAD Anterior circulation: Internal carotid arteries are patent from skullbase to carotid termini. There is mild  bilateral carotid siphon atherosclerosis without significant stenosis. ACAs and MCAs are patent without evidence of major branch occlusion or significant proximal stenosis. There is an early MCA bifurcation on the right. No intracranial aneurysm is identified. Posterior circulation: Intracranial vertebral arteries are widely patent to the vertebrobasilar junction. PICA, AICA, and SCA origins are patent. Basilar artery is patent without stenosis. Posterior communicating arteries are not clearly identified. PCAs are patent without evidence of significant stenosis. Venous sinuses: Patent. Anatomic variants: None. Delayed phase: No abnormal enhancement. IMPRESSION: 1. Very mild intracranial and extracranial atherosclerosis without evidence of major vessel occlusion or significant stenosis. 2. Partially visualized bilateral pleural effusions and extensive bilateral lung consolidation which may represent ARDS. Electronically Signed   By: Sebastian Ache M.D.   On: 10/05/2015 13:40   Dg Chest Port 1 View  10/06/2015  CLINICAL DATA:  Hypoxia EXAM: PORTABLE CHEST 1 VIEW COMPARISON:  October 05, 2015 FINDINGS: Endotracheal tube tip is 3.6 cm above the carina. Central catheter tip is in the superior vena cava. Nasogastric tube tip and side port below the diaphragm. No pneumothorax. There is extensive interstitial and alveolar edema bilaterally, stable. Heart is mildly enlarged with pulmonary venous hypertension. No new opacity. No adenopathy evident. IMPRESSION: Tube and catheter positions as described without pneumothorax. Widespread interstitial and alveolar edema, stable. No new opacity. The appearance is consistent with congestive heart failure, although superimposed pneumonia cannot be excluded radiographically. Both entities may exist concurrently. No change in cardiac silhouette. Electronically Signed   By: Bretta Bang III M.D.   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  Had PCI with DES to LAD.  ASSESSMENT / PLAN:  CARDIOVASCULAR A:  STEMI. Acute systolic CHF. P:  ASA, coreg, lisinopril, brilinta Defer lipid lowering agents to cardiology  PULMONARY A: Acute respiratory failure from pulmonary edema, HCAP. P:   Full vent support F/u CXR Lasix 40 mg IV x 2 on 3/15 Might need trach to assist with vent weaning  RENAL A:   Hypokalemia >> improved. Hypernatremia. P:   Correct electrolytes as indicated Free water  GASTROINTESTINAL A:   Hx PUD. Nutrition. P:   Tube feeds while on vent Protonix for SUP  HEMATOLOGIC A:   Anemia of critical illness >> no evidence for bleeding. P:  F/u CBC SQ heparin Transfuse for Hb < 7  INFECTIOUS A:   HCAP. P:   Day 3 vancomycin, fortaz  ENDOCRINE A:   Hyperglycemia -  no hx of DM.    P:   SSI with lantus  NEUROLOGIC A:   Acute encephalopathy after cardiac arrest. Acute CVA 3/13. P:   RASS goal -1  CC time 36 minutes.  Coralyn Helling, MD [facility] Pulmonary/Critical Care 10/06/2015, 8:27 AM Pager:  [phone] After 3pm call: [phone]

## 2015-10-06 NOTE — Progress Notes (Signed)
Patient Name: Eddie [name] Date of Encounter: 10/06/2015  Active Problems:   Cardiac arrest (HCC)   ST elevation myocardial infarction (STEMI) (HCC)   STEMI (ST elevation myocardial infarction) (HCC)   Encounter for central line placement   Hypokalemia   Encephalopathy acute   Acute hypoxemic respiratory failure (HCC)   Acute respiratory failure (HCC)   Altered mental status   Hemiplegia (HCC)   Stroke (HCC)   ARDS (adult respiratory distress syndrome) (HCC)   Length of Stay: 8  SUBJECTIVE  Failed attempts at weaning vent, very agitated and hypoxic. Despite high UO, net fluid balance even last 24h.  CURRENT MEDS . alteplase  2 mg Intracatheter Once  . antiseptic oral rinse  7 mL Mouth Rinse 10 times per day  . aspirin  81 mg Per Tube Daily  . carvedilol  6.25 mg Per Tube BID WC  . cefTAZidime (FORTAZ)  IV  1 g Intravenous Q8H  . chlorhexidine gluconate  15 mL Mouth Rinse BID  . free water  200 mL Per Tube QID  . furosemide  40 mg Intravenous Q6H  . heparin  5,000 Units Subcutaneous 3 times per day  . insulin aspart  0-15 Units Subcutaneous 6 times per day  . insulin glargine  5 Units Subcutaneous QHS  . lisinopril  5 mg Oral Daily  . pantoprazole sodium  40 mg Per Tube Daily  . potassium chloride  40 mEq Oral Q6H  . ticagrelor  90 mg Oral BID  . vancomycin  1,000 mg Intravenous Q12H    OBJECTIVE   Intake/Output Summary (Last 24 hours) at 10/06/15 0953 Last data filed at 10/06/15 0939  Gross per 24 hour  Intake 3290.3 ml  Output   3110 ml  Net  180.3 ml   Filed Weights   10/04/15 0500 10/05/15 0400 10/06/15 0500  Weight: 70.9 kg (156 lb 4.9 oz) 71.5 kg (157 lb 10.1 oz) 67.8 kg (149 lb 7.6 oz)    PHYSICAL EXAM Filed Vitals:   10/06/15 0827 10/06/15 0849 10/06/15 0900 10/06/15 0930  BP:   71/48 129/90  Pulse: 126 81 79 99  Temp:      TempSrc:      Resp: 36 Height:      Weight:      SpO2: 95% 100% 100% 100%   General: sedated,  intubated Head: no evidence of trauma, will not open eyes today, normal ears, nose and oropharynx Neck: normal jugular venous pulsations and no hepatojugular reflux; brisk carotid pulses without delay and no carotid bruits Chest: clear to auscultation, no signs of consolidation by percussion or palpation, normal fremitus, symmetrical and full respiratory excursions Cardiovascular: normal position and quality of the apical impulse, regular rhythm, normal first and second heart sounds, no rubs or gallops, no murmur Abdomen: no tenderness or distention, no masses by palpation, no abnormal pulsatility or arterial bruits, normal bowel sounds, no hepatosplenomegaly Extremities: no clubbing, cyanosis or edema; 2+ radial, ulnar and brachial pulses bilaterally; 2+ right femoral, posterior tibial and dorsalis pedis pulses; 2+ left femoral, posterior tibial and dorsalis pedis pulses; no subclavian or femoral bruits Neurological: not moving left arm/hand  LABS  CBC  Recent Labs  10/05/15 0415 10/06/15 0355  WBC 12.0* 12.2*  HGB 7.6* 7.4*  HCT 25.9* 24.8*  MCV 81.4 80.5  PLT 226 274   Basic Metabolic Panel  Recent Labs  10/05/15 0415 10/06/15 0355  NA 148* 146*  K 4.5 3.4*  CL 112* 110  CO2 26 29  GLUCOSE 110* 225*  BUN 27* 29*  CREATININE 0.99 1.06  CALCIUM 8.4* 8.0*   Liver Function Tests  Recent Labs  10/05/15 0415  AST 16  ALT 10*  ALKPHOS 72  BILITOT 1.1  PROT 5.8*  ALBUMIN 1.9*     Recent Labs  10/05/15 1522  HGBA1C 6.9*   Fasting Lipid Panel  Recent Labs  10/05/15 1522  CHOL 136  HDL 19*  LDLCALC 99  TRIG 92  CHOLHDL 7.2   Thyroid Function Tests  Recent Labs  10/05/15 1522  TSH 0.196*    Radiology Studies Imaging results have been reviewed and Ct Angio Head W/cm &/or Wo Cm  10/05/2015  CLINICAL DATA:  Acute posterior right frontal lobe infarct on MRI. EXAM: CT ANGIOGRAPHY HEAD AND NECK TECHNIQUE: Multidetector CT imaging of the head and neck was  performed using the standard protocol during bolus administration of intravenous contrast. Multiplanar CT image reconstructions and MIPs were obtained to evaluate the vascular anatomy. Carotid stenosis measurements (when applicable) are obtained utilizing NASCET criteria, using the distal internal carotid diameter as the denominator. CONTRAST:  50mL OMNIPAQUE IOHEXOL 350 MG/ML SOLN COMPARISON:  Brain MRI and CT 10/04/2015. No prior angiographic imaging. FINDINGS: CT HEAD Brain: Small focus of hypoattenuation in the posterior right frontal lobe white matter corresponds to the acute infarct demonstrated on yesterday's MRI. There is no evidence of acute intracranial hemorrhage, mass, midline shift, or extra-axial fluid collection. Mild white matter hypoattenuation adjacent to the right temporal horn as on MRI. Ventricles and sulci are within normal limits for age. Calvarium and skull base: No destructive osseous lesion. Paranasal sinuses: Mild mucosal thickening, most notable in the left sphenoid sinus. Orbits: Postoperative changes to the right globe. CTA NECK Aortic arch: 3 vessel aortic arch with mild atherosclerotic calcification. Brachiocephalic and subclavian arteries are widely patent with mild non stenotic plaque. Right carotid system: Patent with mild mixed calcified and noncalcified plaque at the carotid bifurcation and in the proximal ICA. No significant stenosis. Left carotid system: Patent with minimal atherosclerotic luminal irregularity. No stenosis. Vertebral arteries:Patent and codominant without stenosis. Skeleton: Mild cervical spondylosis. Other neck: Poor dentition with multiple periapical lucencies and missing teeth. Endotracheal and enteric tubes with small volume pharyngeal fluid. Extensive consolidative opacities partially visualized in the lung apices. Pleural effusions partially visualized. Left jugular venous catheter terminating in the proximal SVC. CTA HEAD Anterior circulation: Internal  carotid arteries are patent from skullbase to carotid termini. There is mild bilateral carotid siphon atherosclerosis without significant stenosis. ACAs and MCAs are patent without evidence of major branch occlusion or significant proximal stenosis. There is an early MCA bifurcation on the right. No intracranial aneurysm is identified. Posterior circulation: Intracranial vertebral arteries are widely patent to the vertebrobasilar junction. PICA, AICA, and SCA origins are patent. Basilar artery is patent without stenosis. Posterior communicating arteries are not clearly identified. PCAs are patent without evidence of significant stenosis. Venous sinuses: Patent. Anatomic variants: None. Delayed phase: No abnormal enhancement. IMPRESSION: 1. Very mild intracranial and extracranial atherosclerosis without evidence of major vessel occlusion or significant stenosis. 2. Partially visualized bilateral pleural effusions and extensive bilateral lung consolidation which may represent ARDS. Electronically Signed   By: Sebastian Ache M.D.   On: 10/05/2015 13:40   Ct Angio Neck W/cm &/or Wo/cm  10/05/2015  CLINICAL DATA:  Acute posterior right frontal lobe infarct on MRI. EXAM: CT ANGIOGRAPHY HEAD AND NECK TECHNIQUE: Multidetector CT imaging of the head and neck was performed  using the standard protocol during bolus administration of intravenous contrast. Multiplanar CT image reconstructions and MIPs were obtained to evaluate the vascular anatomy. Carotid stenosis measurements (when applicable) are obtained utilizing NASCET criteria, using the distal internal carotid diameter as the denominator. CONTRAST:  56mL OMNIPAQUE IOHEXOL 350 MG/ML SOLN COMPARISON:  Brain MRI and CT 10/04/2015. No prior angiographic imaging. FINDINGS: CT HEAD Brain: Small focus of hypoattenuation in the posterior right frontal lobe white matter corresponds to the acute infarct demonstrated on yesterday's MRI. There is no evidence of acute intracranial  hemorrhage, mass, midline shift, or extra-axial fluid collection. Mild white matter hypoattenuation adjacent to the right temporal horn as on MRI. Ventricles and sulci are within normal limits for age. Calvarium and skull base: No destructive osseous lesion. Paranasal sinuses: Mild mucosal thickening, most notable in the left sphenoid sinus. Orbits: Postoperative changes to the right globe. CTA NECK Aortic arch: 3 vessel aortic arch with mild atherosclerotic calcification. Brachiocephalic and subclavian arteries are widely patent with mild non stenotic plaque. Right carotid system: Patent with mild mixed calcified and noncalcified plaque at the carotid bifurcation and in the proximal ICA. No significant stenosis. Left carotid system: Patent with minimal atherosclerotic luminal irregularity. No stenosis. Vertebral arteries:Patent and codominant without stenosis. Skeleton: Mild cervical spondylosis. Other neck: Poor dentition with multiple periapical lucencies and missing teeth. Endotracheal and enteric tubes with small volume pharyngeal fluid. Extensive consolidative opacities partially visualized in the lung apices. Pleural effusions partially visualized. Left jugular venous catheter terminating in the proximal SVC. CTA HEAD Anterior circulation: Internal carotid arteries are patent from skullbase to carotid termini. There is mild bilateral carotid siphon atherosclerosis without significant stenosis. ACAs and MCAs are patent without evidence of major branch occlusion or significant proximal stenosis. There is an early MCA bifurcation on the right. No intracranial aneurysm is identified. Posterior circulation: Intracranial vertebral arteries are widely patent to the vertebrobasilar junction. PICA, AICA, and SCA origins are patent. Basilar artery is patent without stenosis. Posterior communicating arteries are not clearly identified. PCAs are patent without evidence of significant stenosis. Venous sinuses: Patent.  Anatomic variants: None. Delayed phase: No abnormal enhancement. IMPRESSION: 1. Very mild intracranial and extracranial atherosclerosis without evidence of major vessel occlusion or significant stenosis. 2. Partially visualized bilateral pleural effusions and extensive bilateral lung consolidation which may represent ARDS. Electronically Signed   By: Sebastian Ache M.D.   On: 10/05/2015 13:40   Mr Brain Wo Contrast  10/05/2015  CLINICAL DATA:  Cardiac arrest, respiratory failure. Ventilated patient, not moving LEFT arm. Evaluate hemiplegia. EXAM: MRI HEAD WITHOUT CONTRAST TECHNIQUE: Multiplanar, multiecho pulse sequences of the brain and surrounding structures were obtained without intravenous contrast. COMPARISON:  CT head October 04, 2015 0304 hours FINDINGS: Focal 13 x 9 mm area of reduced diffusion LEFT posterior frontal deep white matter, with corresponding low ADC value. No susceptibility artifact to suggest hemorrhage. Intermediate FLAIR T2 hyperintense signal along RIGHT temporal horn. A few scattered supratentorial subcentimeter white matter FLAIR T2 hyperintensities. Tiny old cerebellar infarcts. Small LEFT caudate head lacunar infarct. No abnormal extra-axial fluid collections. Normal major intracranial vascular flow voids present at skull base. Status post RIGHT ocular lens implant in mild susceptibility artifact, likely representing glaucoma drainage implant. No abnormal sellar expansion. No cerebellar tonsillar ectopia. No suspicious calvarial bone marrow signal. Life-support lines in place. Mild paranasal sinus mucosal thickening with bilateral mastoid effusions. IMPRESSION: Acute small RIGHT for frontal lobe white matter infarct. Mild white matter changes compatible with chronic small vessel ischemic disease.  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 Abnormal RIGHT temporal periventricular signal favoring old ischemia and gliosis though somewhat atypical in location. Recommend follow-up  MRI of the brain with contrast on a nonemergent basis. Electronically Signed   By: Awilda Metro M.D.   On: 10/05/2015 00:54   Dg Chest Port 1 View  10/06/2015  CLINICAL DATA:  Hypoxia EXAM: PORTABLE CHEST 1 VIEW COMPARISON:  October 05, 2015 FINDINGS: Endotracheal tube tip is 3.6 cm above the carina. Central catheter tip is in the superior vena cava. Nasogastric tube tip and side port below the diaphragm. No pneumothorax. There is extensive interstitial and alveolar edema bilaterally, stable. Heart is mildly enlarged with pulmonary venous hypertension. No new opacity. No adenopathy evident. IMPRESSION: Tube and catheter positions as described without pneumothorax. Widespread interstitial and alveolar edema, stable. No new opacity. The appearance is consistent with congestive heart failure, although superimposed pneumonia cannot be excluded radiographically. Both entities may exist concurrently. No change in cardiac silhouette. Electronically Signed   By: Bretta Bang III M.D.   On: 10/06/2015 07:15   Dg Chest Port 1 View  10/05/2015  CLINICAL DATA:  Respiratory failure, smoker EXAM: PORTABLE CHEST 1 VIEW COMPARISON:  Portable exam 0522 hours compared to 10/03/2015 FINDINGS: Tip of endotracheal tube projects 3.7 cm above carina. Nasogastric tube extends into stomach. LEFT jugular central venous catheter tip projects over SVC. Enlargement of cardiac silhouette. Diffuse BILATERAL pulmonary infiltrates increased since previous exam likely pulmonary edema though infection is not excluded. No pleural effusion or pneumothorax. Bones unremarkable. IMPRESSION: Satisfactory line and tube positions. Diffuse BILATERAL pulmonary infiltrates significantly increased since previous exam question pulmonary edema though multifocal pneumonia not excluded. Electronically Signed   By: Ulyses Southward M.D.   On: 10/05/2015 08:12    TELE NSR  ECG Pending repeat  ASSESSMENT AND PLAN  1. S/P cardiac arrest - witnessed,  relatively short resuscitation, had early cooling. Would expect reasonably good neuro outcome, but there is evidence of a new stroke right frontal subcortical CVA. 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 2. S/p anterior STEMI and PCI-DES - have to continue ASA/ticagrelor. On low dose carvedilol. Will increase ACE inh., but use low dose to avoid hypotension in face of possible recent CVA. Renal function is normal. 3. Acute systolic heart failure/acute pulmonary edema - EF of 10-15% immediately post MI/arrest may still improve. Note LV is already dilated, suggesting some degree of chronic cardiomyopathy. Will continue diuretics, increase ACEi. 3. Ventilator-dependent respiratory failure. Defer weaning attempts to Dr. Craige Cotta. He is a smoker. Need to resolve pulmonary edema before extubation. 4. New CVA (right frontal white matter infarct). Source could be cardioembolic in view of severe LV dysfunction, but defect is small and subcortical. CT angio without significant disease. Check echo with [facility] for LV thrombus.Even if clot not seen today, would be probably justified to anticoagulate. IF LV clot present, will start heparin-warfarin. If no clot currently present, will wait until 30 days after stent, when we will stop aspirin and start warfarin. Source of anemia still not clarified, although rapid drop in Hgb suggests bleeding.  5. Anemia - sudden drop in Hgb without overt bleeding without hemodynamic instability, melena or hematemesis. Elevated haptoglobin excludes hemolysis. No physical findings to suggest retroperitoneal hematoma, drop in Hgb occurred between 3/10 and 3/12 (cath on 3/7). If Hgb < 7, would transfuse. 6. Hypernatremia - improving with free water via NGT   Thurmon Fair, MD, [facility] [phone] office [phone] pager 10/06/2015  9:53 AM

## 2015-10-06 NOTE — Progress Notes (Signed)
   Name: Eddie [name] MRN: [id number] DOB: Feb 27, 1951    ADMISSION DATE:  09/28/2015  Brief Note: Nurse calls me for sedation issue. Has been agitated since last night. They tried Precedex which did not control his agitation. He was tried on propofol this morning but was still awake and agitated. He then was switched to Versed and fentanyl drip. Currently on first set at 9 mg an hour. He is also on fentanyl drip at 400 g an hour.  Does not look like patient is in pain. Waxing and waning sensorium. Does not follow commands. Eyes open. Blood pressure 190/90, heart rate 120, respiratory rate 20-30. Bibasilar crackles. Tachycardic. Belly soft and benign. Warm extremities.  Not sure if the agitation and restlessness is related to recent stroke or related to anoxic injury related to arrest.   Plan: 1. Propofol has been restarted and patient seems comfortable right now. 2. Continue Versed drip and fentanyl drip. 3. May go up on propofol as blood pressure tolerates. 4. May also try Dilaudid pushes prn to keep him comfortable if propofol is not working. 5. Will need to observe bowels.    Pollie Meyer, MD Pulmonary and Critical Care Medicine [facility] Pager: [phone] After 3 pm or if no answer, call [phone]  10/06/2015, 2:07 PM   Addendum: Patient is comfortable now on propofol at 10 mg an hour. Also on Versed at 10 mg an hour and fentanyl at 400 g an hour. Blood pressures coming down with 80/60, heart rate in the high 90s. Plan to start low-dose levophed. Told the nurse to give [place] a call back if he has a requiring a lot of levophed. Told the nurse to also try to cut down in the fentanyl.

## 2015-10-06 NOTE — Progress Notes (Signed)
Pt coughing up thick tan secretions. Extremely agitated on near max dose of fentanyl and propofol, HR in 120. Dr. Craige Cotta Made aware. New orders for versed received. Will continue to monitor closely.

## 2015-10-06 NOTE — Progress Notes (Addendum)
STROKE TEAM PROGRESS NOTE   SUBJECTIVE (INTERVAL HISTORY) His RN is at the bedside. He is still on heavy sedation and not responding to any commands. Repeat TTE showed EF 30-35% without thrombus.    OBJECTIVE Temp:  [98.2 F (36.8 C)-99.8 F (37.7 C)] 99.8 F (37.7 C) (03/15 1935) Pulse Rate:  [69-138] 99 (03/15 2100) Cardiac Rhythm:  [-] Normal sinus rhythm;Other (Comment) (03/15 1925) Resp:  [15-36] 28 (03/15 2100) BP: (71-200)/(48-110) 87/61 mmHg (03/15 2100) SpO2:  [91 %-100 %] 98 % (03/15 2100) FiO2 (%):  [30 %-40 %] 30 % (03/15 2000) Weight:  [149 lb 7.6 oz (67.8 kg)] 149 lb 7.6 oz (67.8 kg) (03/15 0500)  CBC:   Recent Labs Lab 10/05/15 0415 10/06/15 0355  WBC 12.0* 12.2*  HGB 7.6* 7.4*  HCT 25.9* 24.8*  MCV 81.4 80.5  PLT 226 274    Basic Metabolic Panel:  Recent Labs Lab 09/30/15 0322 10/01/15 0331  10/05/15 0415 10/06/15 0355  NA 147* 145  < > 148* 146*  K 4.1 3.5  < > 4.5 3.4*  CL 116* 117*  < > 112* 110  CO2 21* 19*  < > 26 29  GLUCOSE 118* 106*  < > 110* 225*  BUN 20 24*  < > 27* 29*  CREATININE 1.13 1.09  < > 0.99 1.06  CALCIUM 8.5* 8.5*  < > 8.4* 8.0*  MG 1.8 2.0  --   --   --   PHOS 3.7 3.5  --   --   --   < > = values in this interval not displayed.  Lipid Panel:     Component Value Date/Time   CHOL 136 10/05/2015 1522   TRIG 91 10/06/2015 0955   HDL 19* 10/05/2015 1522   CHOLHDL 7.2 10/05/2015 1522   VLDL 18 10/05/2015 1522   LDLCALC 99 10/05/2015 1522   HgbA1c:  Lab Results  Component Value Date   HGBA1C 6.9* 10/05/2015   Urine Drug Screen: No results found for: LABOPIA, COCAINSCRNUR, LABBENZ, AMPHETMU, THCU, LABBARB    IMAGING I have personally reviewed the radiological images below and agree with the radiology interpretations.  Ct Head Wo Contrast 10/04/2015   1. No hemorrhage or cortical infarct. 2. Small area of right frontal white matter low density would usually reflect chronic microvascular disease but is newly seen,  raising the possibility of interval white matter infarct.   Mr Brain Wo Contrast 10/05/2015   Acute small RIGHT for frontal lobe white matter infarct. Mild white matter changes compatible with chronic small vessel ischemic disease. 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 Abnormal RIGHT temporal periventricular signal favoring old ischemia and gliosis though somewhat atypical in location. Recommend follow-up MRI of the brain with contrast on a nonemergent basis.   Dg Chest Port 1 View 10/05/2015  I'll use aneed to bring arrangement? And then going to be old, bypasses? Satisfactory line and tube positions. Diffuse BILATERAL pulmonary infiltrates significantly increased since previous exam question pulmonary edema though multifocal pneumonia not excluded.   2-D echocardiogram Left ventricle: The cavity size was moderately dilated. Wall thickness was increased in a pattern of mild LVH. Systolic function was severely reduced. The estimated ejection fraction was in the range of 10% to 15%. of the mid-apicalanteroseptal and inferior myocardium; consistent with infarction. Doppler parameters are consistent with abnormal left ventricular relaxation (grade 1 diastolic dysfunction).  Ct Angio Head and neck W/cm &/or Wo Cm  10/05/2015  IMPRESSION: 1. Very mild intracranial  and extracranial atherosclerosis without evidence of major vessel occlusion or significant stenosis. 2. Partially visualized bilateral pleural effusions and extensive bilateral lung consolidation which may represent ARDS.    Dg Chest Port 1 View  10/05/2015  IMPRESSION: Satisfactory line and tube positions. Diffuse BILATERAL pulmonary infiltrates significantly increased since previous exam question pulmonary edema though multifocal pneumonia not excluded.  Repeat 2D echo 10/06/15 - Left ventricle: The cavity size was mildly dilated. There was  mild concentric hypertrophy. Systolic function was moderately to  severely  reduced. The estimated ejection fraction was in the  range of 30% to 35%. Doppler parameters are consistent with  abnormal left ventricular relaxation (grade 1 diastolic  dysfunction). - Regional wall motion abnormality: Hypokinesis of the mid  anteroseptal, basal-mid anterolateral, apical septal, apical  lateral, and apical myocardium. - Aortic valve: Transvalvular velocity was within the normal range.  There was no stenosis. There was no regurgitation. - Mitral valve: Transvalvular velocity was within the normal range.  There was no evidence for stenosis. There was trivial  regurgitation. - Right ventricle: The cavity size was normal. Wall thickness was  normal. Systolic function was normal. - Atrial septum: No defect or patent foramen ovale was identified  by color flow Doppler. - Tricuspid valve: There was trivial regurgitation.  One hour EEG 10/01/15 - This is an abnormal routine EEG of the sedated state due to generalized background slowing. This is indicative of diffuse cerebral dysfunction which may be due to pharmacologic, hypoxic, toxic-metabolic or other diffuse physiologic etiology.   PHYSICAL EXAM  Temp:  [98.2 F (36.8 C)-99.8 F (37.7 C)] 99.8 F (37.7 C) (03/15 1935) Pulse Rate:  [69-138] 99 (03/15 2100) Resp:  [15-36] 28 (03/15 2100) BP: (71-200)/(48-110) 87/61 mmHg (03/15 2100) SpO2:  [91 %-100 %] 98 % (03/15 2100) FiO2 (%):  [30 %-40 %] 30 % (03/15 2000) Weight:  [149 lb 7.6 oz (67.8 kg)] 149 lb 7.6 oz (67.8 kg) (03/15 0500)  General - thin built, well developed, intubated and on heavy sedation with fentanyl, propofol and versed drip.  Ophthalmologic - Fundi not visualized due to noncooperation.  Cardiovascular - Regular rate and rhythm.  Neuro - intubated with heavy sedation, not responsive to commands. Eyes closed, not open on voice or pain stimulation. Pupil equal size, 2mm, not reactive to light, eyes fixed in mid position. No doll's eyes, no  corneal or gag. Not breathing over the vent. No spontaneous movement of all extremities, even with pain stimulation. No babinski and DTR diminished. Sensation, coordination and gait not able to test.   ASSESSMENT/PLAN Eddie [name] is a 65 y.o. male with history of recent PEA arrest s/p PCI-DES of the LAD who woke post hypothermia with left-sided weakness. MRI showed a small right frontal lobe white matter infarct. He did not receive IV t-PA due to unknown last known well.   Altered mental status - due to heavy sedation, respiratory failure, ? ARDS, cardiomyopathy. The small right MCA/ACA and punctate left MCA/ACA infarcts seen on MRI can not explain the mental status. Will need reassess off sedation.  Not able to explained by MRI small infarcts  EEG diffuse slowing, no seizure  TTE EF 10-15% one week ago, and repeat TTE showed EF improved to 30-35%  Treat respiratory failure as per primary team  Continue Abx coverage as per primary team  Blood culture if fever spike  UA WBC 6-30  Wean off sedation as able  Need to consider trach and peg  Stroke:  small  right frontal lobe WM infarct and punctate left frontal cortical infarct, embolic pattern likely due to STEMI, low EF 10-15%, post cardiac arrest and cardiac cath. However, the small and punctate infarct can not explain pt mental status and severe left hemiplegia. Need reassess off sedation.  MRI  Small right frontal lobe WM infarct and punctate left frontal cortical infarct  CT angiogram of head and neck - negative but found ? ARDS  2D Echo  EF 10-15% No source of embolus seen   Repeat TEE showed EF 30-35% and no embolus  LDL 99  HgbA1c 6.9  Heparin 5000 units sq tid for VTE prophylaxis Diet NPO time specified  No antithrombotic prior to admission, now on aspirin 81 mg daily and Brilinta 90 mg daily for STEMI  Ongoing aggressive stroke risk factor management  Therapy recommendations:  pending  Disposition:  pending    Hyperlipidemia  Not on home statin  LDL 99, goal < 70  Add lipitor 20mg   Continue at discharge.  Hyperglycemia   HgbA1c 6.9, goal < 7.0  On lantus  SSI  Other Stroke Risk Factors  Cigarette smoker, need to stop smoking  Coronary artery disease - admitted with PEA arrest with acute STEMI s/p PCI-DES of LAD  Other Active Problems  STEMI  Acute systolic heart failure  Acute respiratory failure from pulmonary edema, HCAP  Hypokalemia  Hypernatremia  Anemia of critical illness  Hospital day # 8  This patient is critically ill due to AMS, ARDS, STEMI, cardiomyopathy and at significant risk of neurological worsening, death form recurrent strokes, ARDS, heart failure, sepsis and seizure. This patient's care requires constant monitoring of vital signs, hemodynamics, respiratory and cardiac monitoring, review of multiple databases, neurological assessment, discussion with family, other specialists and medical decision making of high complexity. I spent 35 minutes of neurocritical care time in the care of this patient.  Neurology will sign off. Please call with questions. Pt will follow up with Dr. Roda Shutters at [facility] in about 2 months. Thanks for the consult.   Marvel Plan, MD PhD Stroke Neurology 10/06/2015 9:23 PM    To contact Stroke Continuity provider, please refer to [email or web address]. After hours, contact General Neurology

## 2015-10-06 NOTE — Progress Notes (Signed)
   Name: Eddie [name] MRN: [id number] DOB: 02/06/1951    ADMISSION DATE:  09/28/2015  Brief Note: Nurse calls me for sedation issue. Has been agitated since last night. They tried Precedex which did not control his agitation. He was tried on propofol this morning but was still awake and agitated. He then was switched to Versed and fentanyl drip. Currently on first set at 9 mg an hour. He is also on fentanyl drip at 400 g an hour.  Does not look like patient is in pain. Waxing and waning sensorium. Does not follow commands. Eyes open. Blood pressure 190/90, heart rate 120, respiratory rate 20-30. Bibasilar crackles. Tachycardic. Belly soft and benign. Warm extremities.  Not sure if the agitation and restlessness is related to recent stroke or related to anoxic injury related to arrest.   Plan: 1. Propofol has been restarted and patient seems comfortable right now. 2. Continue Versed drip and fentanyl drip. 3. May go up on propofol as blood pressure tolerates. 4. May also try Dilaudid pushes prn to keep him comfortable if propofol is not working. 5. Will need to observe bowels.    Pollie Meyer, MD Pulmonary and Critical Care Medicine [facility] Pager: [phone] After 3 pm or if no answer, call [phone]  10/06/2015, 1:32 PM

## 2015-10-06 NOTE — Progress Notes (Signed)
Pt continues to be agitated coughing up thick tan secretions. On high doses of sedation, pt continues to be agitated. HR 130s BP 200/98. Dr. Craige Cotta notified. Will continue to monitor closely.

## 2015-10-07 ENCOUNTER — Inpatient Hospital Stay (HOSPITAL_COMMUNITY): Payer: BLUE CROSS/BLUE SHIELD

## 2015-10-07 ENCOUNTER — Other Ambulatory Visit (HOSPITAL_COMMUNITY): Payer: Self-pay

## 2015-10-07 LAB — CBC
HEMATOCRIT: 25.1 % — AB (ref 39.0–52.0)
Hemoglobin: 7.8 g/dL — ABNORMAL LOW (ref 13.0–17.0)
MCH: 24.8 pg — AB (ref 26.0–34.0)
MCHC: 31.1 g/dL (ref 30.0–36.0)
MCV: 79.9 fL (ref 78.0–100.0)
Platelets: 326 10*3/uL (ref 150–400)
RBC: 3.14 MIL/uL — ABNORMAL LOW (ref 4.22–5.81)
RDW: 13.6 % (ref 11.5–15.5)
WBC: 12.3 10*3/uL — ABNORMAL HIGH (ref 4.0–10.5)

## 2015-10-07 LAB — GLUCOSE, CAPILLARY
GLUCOSE-CAPILLARY: 199 mg/dL — AB (ref 65–99)
GLUCOSE-CAPILLARY: 238 mg/dL — AB (ref 65–99)
GLUCOSE-CAPILLARY: 228 mg/dL — AB (ref 65–99)
GLUCOSE-CAPILLARY: 190 mg/dL — AB (ref 65–99)
Glucose-Capillary: 214 mg/dL — ABNORMAL HIGH (ref 65–99)
Glucose-Capillary: 263 mg/dL — ABNORMAL HIGH (ref 65–99)

## 2015-10-07 LAB — BASIC METABOLIC PANEL
Anion gap: 8 (ref 5–15)
BUN: 28 mg/dL — AB (ref 6–20)
CALCIUM: 7.9 mg/dL — AB (ref 8.9–10.3)
CHLORIDE: 105 mmol/L (ref 101–111)
CO2: 30 mmol/L (ref 22–32)
Creatinine, Ser: 1.09 mg/dL (ref 0.61–1.24)
GFR calc Af Amer: >60 mL/min (ref >60)
GLUCOSE: 269 mg/dL — AB (ref 65–99)
POTASSIUM: 3.3 mmol/L — AB (ref 3.5–5.1)
Sodium: 143 mmol/L (ref 135–145)

## 2015-10-07 LAB — CULTURE, RESPIRATORY: SPECIAL REQUESTS: NORMAL

## 2015-10-07 LAB — CULTURE, RESPIRATORY W GRAM STAIN

## 2015-10-07 MED ORDER — SODIUM CHLORIDE 0.9 % IV SOLN
INTRAVENOUS | Status: DC
  Administered 2015-10-07: 05:00:00 via INTRAVENOUS

## 2015-10-07 MED ORDER — POTASSIUM CHLORIDE 20 MEQ/15ML (10%) PO SOLN
40.0000 meq | Freq: Once | ORAL | Status: AC
  Administered 2015-10-07: 40 meq
  Filled 2015-10-07: qty 30

## 2015-10-07 MED ORDER — INSULIN GLARGINE 100 UNIT/ML ~~LOC~~ SOLN
10.0000 [IU] | Freq: Every day | SUBCUTANEOUS | Status: DC
  Administered 2015-10-07 – 2015-10-13 (×7): 10 [IU] via SUBCUTANEOUS
  Filled 2015-10-07 (×9): qty 0.1

## 2015-10-07 MED ORDER — FUROSEMIDE 10 MG/ML IJ SOLN
40.0000 mg | Freq: Once | INTRAMUSCULAR | Status: AC
  Administered 2015-10-07: 40 mg via INTRAVENOUS
  Filled 2015-10-07: qty 4

## 2015-10-07 MED ORDER — LISINOPRIL 10 MG PO TABS
10.0000 mg | ORAL_TABLET | Freq: Once | ORAL | Status: AC
Start: 2015-10-07 — End: 2015-10-07
  Administered 2015-10-07: 10 mg via ORAL
  Filled 2015-10-07: qty 1

## 2015-10-07 MED ORDER — CEFAZOLIN SODIUM-DEXTROSE 2-3 GM-% IV SOLR
2.0000 g | Freq: Three times a day (TID) | INTRAVENOUS | Status: DC
  Administered 2015-10-07 – 2015-10-13 (×18): 2 g via INTRAVENOUS
  Filled 2015-10-07 (×22): qty 50

## 2015-10-07 MED ORDER — LISINOPRIL 20 MG PO TABS: 20.0000 mg | ORAL_TABLET | Freq: Every day | ORAL | Status: DC

## 2015-10-07 MED ORDER — FUROSEMIDE 10 MG/ML IJ SOLN
80.0000 mg | Freq: Two times a day (BID) | INTRAMUSCULAR | Status: DC
  Administered 2015-10-07: 40 mg via INTRAVENOUS
  Administered 2015-10-07 – 2015-10-08 (×2): 80 mg via INTRAVENOUS
  Filled 2015-10-07 (×3): qty 8

## 2015-10-07 NOTE — Progress Notes (Signed)
PULMONARY / CRITICAL CARE MEDICINE   Name: Eddie [name] MRN: [id number] DOB: [birth year]    ADMISSION DATE:  09/28/2015 CONSULTATION DATE:  09/28/15  REFERRING MD:  Antoine Poche  CHIEF COMPLAINT:  Cardiac Arrest  SUBJECTIVE:  Tolerating SBT.  VITAL SIGNS: BP 154/75 mmHg  Pulse 98  Temp(Src) 98 F (36.7 C) (Oral)  Resp 18  Ht 5\' 7"  (1.702 m)  Wt 154 lb 15.7 oz (70.3 kg)  BMI 24.27 kg/m2  SpO2 97%  VENTILATOR SETTINGS: Vent Mode:  [-] CPAP FiO2 (%):  [30 %-40 %] 30 % Set Rate:  [28 bmp] 28 bmp Vt Set:  [400 mL] 400 mL PEEP:  [5 cmH20] 5 cmH20 Pressure Support:  [5 cmH20] 5 cmH20 Plateau Pressure:  [17 cmH20-21 cmH20] 21 cmH20  INTAKE / OUTPUT: I/O last 3 completed shifts: In: 6038.2 [I.V.:1778.2; Other:80; NG/GT:3330; IV Piggyback:850] Out: 3550 [Urine:3550]   PHYSICAL EXAMINATION: General: agitated Neuro: RASS -1 HEENT: ETT in place Cardiac: regular Chest: b/l crackles Abd: soft, non tender Ext: no edema Skin: no rashes  LABS:  BMET  Recent Labs Lab 10/05/15 0415 10/06/15 0355 10/07/15 0515  NA 148* 146* 143  K 4.5 3.4* 3.3*  CL 112* 110 105  CO2 26 29 30   BUN 27* 29* 28*  CREATININE 0.99 1.06 1.09  GLUCOSE 110* 225* 269*    Electrolytes  Recent Labs Lab 10/01/15 0331  10/05/15 0415 10/06/15 0355 10/07/15 0515  CALCIUM 8.5*  < > 8.4* 8.0* 7.9*  MG 2.0  --   --   --   --   PHOS 3.5  --   --   --   --   < > = values in this interval not displayed.  CBC  Recent Labs Lab 10/05/15 0415 10/06/15 0355 10/07/15 0515  WBC 12.0* 12.2* 12.3*  HGB 7.6* 7.4* 7.8*  HCT 25.9* 24.8* 25.1*  PLT 226 274 326    ABG  Recent Labs Lab 10/01/15 0339 10/06/15 1004 10/06/15 1055  PHART 7.494* 7.458* 7.421  PCO2ART 26.8* 41.5 47.1*  PO2ART 138.0* 107.0* 108.0*    Liver Enzymes  Recent Labs Lab 10/05/15 0415  AST 16  ALT 10*  ALKPHOS 72  BILITOT 1.1  ALBUMIN 1.9*    Glucose  Recent Labs Lab 10/06/15 0737 10/06/15 1120  10/06/15 1536 10/06/15 1942 10/06/15 2326 10/07/15 0410  GLUCAP 137* 129* 257* 269* 199* 214*    Imaging Dg Chest Port 1 View  10/07/2015  CLINICAL DATA:  Respiratory failure. EXAM: PORTABLE CHEST 1 VIEW COMPARISON:  October 06, 2015. FINDINGS: Stable cardiomediastinal silhouette. Endotracheal and nasogastric tubes are unchanged in position. Left internal jugular catheter is unchanged in position. No pneumothorax or significant pleural effusion is noted. Bilateral upper lobe opacities are improved compared to prior exam. Stable bibasilar opacities are noted consistent with edema or pneumonia. IMPRESSION: Stable support apparatus. Improved bilateral upper lobe opacities are noted, with stable bibasilar opacities, concerning for edema or pneumonia. Electronically Signed   By: Lupita Raider, M.D.   On: 10/07/2015 07:20     STUDIES:  Cardiac Cath 03/07 > 100% LAD occlusion > DES placed Echo 3/07 >> EF 10 to 15%, grade 1 diastolic  CT head 3/10 >> no acute findings EEG 3/10 >> generalized slowing CT head 3/13 >> Rt frontal white matter low density MRI brain 3/14 >> acute small Rt frontal infarct, old Lt caudate and cerebellar infarcts   CULTURES: 3/07 Sputum >> oral flora 3/13 Sputum >> MSSA  ANTIBIOTICS: 3/13 Vancomycin >>  3/16 3/13 Fortaz >> 3/16 3/16 Ancef >>   SIGNIFICANT EVENTS: 03/07 PEA arrest > taken to cath lab then returned to ICU on vent  > hypothermia started  3/13 Possible CVA >> neuro consulted, increased respiratory secretions >> started ABx 3/15 ARDS protocol, pressors  LINES/TUBES: ETT 03/07 > Lt IJ CVL  03/07 > A line 03/07 > 3/11  DISCUSSION: 65 yo male with dyspnea, chest pain from STEMI >> cardiac arrest in ER with 6 minutes before ROSC.  Had PCI with DES to LAD.  ASSESSMENT / PLAN:  CARDIOVASCULAR A:  STEMI. Acute systolic CHF. Hypotension 3/15 2nd to sedation. P:  ASA, coreg, lisinopril, brilinta, lipitor Wean off levophed to keep MAP >  65  PULMONARY A: Acute respiratory failure from acute pulmonary edema, HCAP, ARDS. P:   Pressure support wean as tolerated >> not ready for extubation F/u CXR Lasix 40 mg IV x 2 on 3/16 Might need trach to assist with vent weaning  RENAL A:   Hypokalemia >> improved. Hypernatremia. P:   Correct electrolytes as indicated D/c'ed free water 3/16  GASTROINTESTINAL A:   Hx PUD. Nutrition. P:   Tube feeds while on vent Protonix for SUP  HEMATOLOGIC A:   Anemia of critical illness >> no evidence for bleeding. P:  F/u CBC SQ heparin Transfuse for Hb < 7  INFECTIOUS A:   HCAP with MSSA. P:   Day 4 of abx, changed to ancef 3/16  ENDOCRINE A:   Hyperglycemia - no hx of DM.    P:   SSI Increased lantus to 10 units on 3/16  NEUROLOGIC A:   Acute encephalopathy after cardiac arrest. Acute CVA 3/13. P:   RASS goal -1  CC time 32 minutes.  Coralyn Helling, MD [facility] Pulmonary/Critical Care 10/07/2015, 9:06 AM Pager:  [phone] After 3pm call: [phone]

## 2015-10-07 NOTE — Progress Notes (Signed)
Pharmacy Antibiotic Note  Eddie [name] is a 65 y.o. male admitted on 09/28/2015 with CP and noted with PEA now s/p cath w/ PCI/DES.Marland Kitchen  Pharmacy has been consulted for fortaz/vancomycin dosing for possible PNA now de-escalating antibiotics to ancef d/t sensitivities.  -WBC= 12, tmax= 100.1 SCr= 1.0 and CrCl ~ 65  Plan: -Fortaz d/c -Vancomycin d/c -Ancef 2g q8 hours   Height: 5\' 7"  (170.2 cm) Weight: 154 lb 15.7 oz (70.3 kg) IBW/kg (Calculated) : 66.1  Temp (24hrs), Avg:99.1 F (37.3 C), Min:98 F (36.7 C), Max:100.1 F (37.8 C)   Recent Labs Lab 10/03/15 0500 10/04/15 0500 10/05/15 0415 10/06/15 0355 10/07/15 0515  WBC 9.5 10.8* 12.0* 12.2* 12.3*  CREATININE 1.03 0.99 0.99 1.06 1.09    Estimated Creatinine Clearance: 64 mL/min (by C-G formula based on Cr of 1.09).    No Known Allergies  Antimicrobials this admission: 3/13 vanc>>3/16 3/13 fortaz>>3/16  Dose adjustments this admission: none  Microbiology results: 3/13 resp>>MSSA 3/7 MRSA PCR- neg  Thank you for allowing pharmacy to be a part of this patient's care.  Sheppard Coil PharmD., BCPS Clinical Pharmacist Pager [phone] 10/07/2015 10:20 AM

## 2015-10-07 NOTE — Progress Notes (Signed)
Patient Name: Eddie [name] Date of Encounter: 10/07/2015  Active Problems:   Cardiac arrest (HCC)   ST elevation myocardial infarction (STEMI) (HCC)   STEMI (ST elevation myocardial infarction) (HCC)   Encounter for central line placement   Hypokalemia   Encephalopathy acute   Acute hypoxemic respiratory failure (HCC)   Acute respiratory failure (HCC)   Altered mental status   Hemiplegia (HCC)   Stroke (HCC)   ARDS (adult respiratory distress syndrome) (HCC)   Absolute anemia   Length of Stay: 9  SUBJECTIVE  Heavily sedated. Still with positive fluid balance, despite good UO.  No family visiting.  CURRENT MEDS . antiseptic oral rinse  7 mL Mouth Rinse 10 times per day  . aspirin  81 mg Per Tube Daily  . atorvastatin  20 mg Oral q1800  . carvedilol  6.25 mg Per Tube BID WC  .  ceFAZolin (ANCEF) IV  2 g Intravenous 3 times per day  . chlorhexidine gluconate  15 mL Mouth Rinse BID  . heparin  5,000 Units Subcutaneous 3 times per day  . insulin aspart  0-15 Units Subcutaneous 6 times per day  . insulin glargine  10 Units Subcutaneous QHS  . lisinopril  10 mg Oral Daily  . pantoprazole sodium  40 mg Per Tube Daily  . sennosides  5 mL Oral BID  . ticagrelor  90 mg Oral BID    OBJECTIVE   Intake/Output Summary (Last 24 hours) at 10/07/15 1100 Last data filed at 10/07/15 1000  Gross per 24 hour  Intake 4420.86 ml  Output   2450 ml  Net 1970.86 ml   Filed Weights   10/05/15 0400 10/06/15 0500 10/07/15 0230  Weight: 71.5 kg (157 lb 10.1 oz) 67.8 kg (149 lb 7.6 oz) 70.3 kg (154 lb 15.7 oz)    PHYSICAL EXAM Filed Vitals:   10/07/15 0800 10/07/15 0850 10/07/15 0900 10/07/15 1000  BP: 135/64 154/75 166/79 111/53  Pulse: 93 98 100 88  Temp:      TempSrc:      Resp: 0 18 22 19   Height:      Weight:      SpO2: 100% 97% 96% 94%   General: sedated, intubated Head: no evidence of trauma, will not open eyes today, normal ears, nose and oropharynx Neck: normal jugular  venous pulsations and no hepatojugular reflux; brisk carotid pulses without delay and no carotid bruits Chest: clear to auscultation, no signs of consolidation by percussion or palpation, normal fremitus, symmetrical and full respiratory excursions Cardiovascular: normal position and quality of the apical impulse, regular rhythm, normal first and second heart sounds, no rubs or gallops, no murmur Abdomen: no tenderness or distention, no masses by palpation, no abnormal pulsatility or arterial bruits, normal bowel sounds, no hepatosplenomegaly Extremities: no clubbing, cyanosis or edema; 2+ radial, ulnar and brachial pulses bilaterally; 2+ right femoral, posterior tibial and dorsalis pedis pulses; 2+ left femoral, posterior tibial and dorsalis pedis pulses; no subclavian or femoral bruits Neurological: not moving left arm/hand  LABS  CBC  Recent Labs  10/06/15 0355 10/07/15 0515  WBC 12.2* 12.3*  HGB 7.4* 7.8*  HCT 24.8* 25.1*  MCV 80.5 79.9  PLT 274 326   Basic Metabolic Panel  Recent Labs  10/06/15 0355 10/07/15 0515  NA 146* 143  K 3.4* 3.3*  CL 110 105  CO2 29 30  GLUCOSE 225* 269*  BUN 29* 28*  CREATININE 1.06 1.09  CALCIUM 8.0* 7.9*   Liver Function Tests  Recent Labs  10/05/15 0415  AST 16  ALT 10*  ALKPHOS 72  BILITOT 1.1  PROT 5.8*  ALBUMIN 1.9*   Hemoglobin A1C  Recent Labs  10/05/15 1522  HGBA1C 6.9*   Fasting Lipid Panel  Recent Labs  10/05/15 1522 10/06/15 0955  CHOL 136  --   HDL 19*  --   LDLCALC 99  --   TRIG 92 91  CHOLHDL 7.2  --    Thyroid Function Tests  Recent Labs  10/05/15 1522  TSH 0.196*    Radiology Studies Imaging results have been reviewed and Ct Angio Head W/cm &/or Wo Cm  10/05/2015  CLINICAL DATA:  Acute posterior right frontal lobe infarct on MRI. EXAM: CT ANGIOGRAPHY HEAD AND NECK TECHNIQUE: Multidetector CT imaging of the head and neck was performed using the standard protocol during bolus administration of  intravenous contrast. Multiplanar CT image reconstructions and MIPs were obtained to evaluate the vascular anatomy. Carotid stenosis measurements (when applicable) are obtained utilizing NASCET criteria, using the distal internal carotid diameter as the denominator. CONTRAST:  50mL OMNIPAQUE IOHEXOL 350 MG/ML SOLN COMPARISON:  Brain MRI and CT 10/04/2015. No prior angiographic imaging. FINDINGS: CT HEAD Brain: Small focus of hypoattenuation in the posterior right frontal lobe white matter corresponds to the acute infarct demonstrated on yesterday's MRI. There is no evidence of acute intracranial hemorrhage, mass, midline shift, or extra-axial fluid collection. Mild white matter hypoattenuation adjacent to the right temporal horn as on MRI. Ventricles and sulci are within normal limits for age. Calvarium and skull base: No destructive osseous lesion. Paranasal sinuses: Mild mucosal thickening, most notable in the left sphenoid sinus. Orbits: Postoperative changes to the right globe. CTA NECK Aortic arch: 3 vessel aortic arch with mild atherosclerotic calcification. Brachiocephalic and subclavian arteries are widely patent with mild non stenotic plaque. Right carotid system: Patent with mild mixed calcified and noncalcified plaque at the carotid bifurcation and in the proximal ICA. No significant stenosis. Left carotid system: Patent with minimal atherosclerotic luminal irregularity. No stenosis. Vertebral arteries:Patent and codominant without stenosis. Skeleton: Mild cervical spondylosis. Other neck: Poor dentition with multiple periapical lucencies and missing teeth. Endotracheal and enteric tubes with small volume pharyngeal fluid. Extensive consolidative opacities partially visualized in the lung apices. Pleural effusions partially visualized. Left jugular venous catheter terminating in the proximal SVC. CTA HEAD Anterior circulation: Internal carotid arteries are patent from skullbase to carotid termini. There  is mild bilateral carotid siphon atherosclerosis without significant stenosis. ACAs and MCAs are patent without evidence of major branch occlusion or significant proximal stenosis. There is an early MCA bifurcation on the right. No intracranial aneurysm is identified. Posterior circulation: Intracranial vertebral arteries are widely patent to the vertebrobasilar junction. PICA, AICA, and SCA origins are patent. Basilar artery is patent without stenosis. Posterior communicating arteries are not clearly identified. PCAs are patent without evidence of significant stenosis. Venous sinuses: Patent. Anatomic variants: None. Delayed phase: No abnormal enhancement. IMPRESSION: 1. Very mild intracranial and extracranial atherosclerosis without evidence of major vessel occlusion or significant stenosis. 2. Partially visualized bilateral pleural effusions and extensive bilateral lung consolidation which may represent ARDS. Electronically Signed   By: Sebastian Ache M.D.   On: 10/05/2015 13:40   Ct Angio Neck W/cm &/or Wo/cm  10/05/2015  CLINICAL DATA:  Acute posterior right frontal lobe infarct on MRI. EXAM: CT ANGIOGRAPHY HEAD AND NECK TECHNIQUE: Multidetector CT imaging of the head and neck was performed using the standard protocol during bolus administration of intravenous  contrast. Multiplanar CT image reconstructions and MIPs were obtained to evaluate the vascular anatomy. Carotid stenosis measurements (when applicable) are obtained utilizing NASCET criteria, using the distal internal carotid diameter as the denominator. CONTRAST:  50mL OMNIPAQUE IOHEXOL 350 MG/ML SOLN COMPARISON:  Brain MRI and CT 10/04/2015. No prior angiographic imaging. FINDINGS: CT HEAD Brain: Small focus of hypoattenuation in the posterior right frontal lobe white matter corresponds to the acute infarct demonstrated on yesterday's MRI. There is no evidence of acute intracranial hemorrhage, mass, midline shift, or extra-axial fluid collection. Mild  white matter hypoattenuation adjacent to the right temporal horn as on MRI. Ventricles and sulci are within normal limits for age. Calvarium and skull base: No destructive osseous lesion. Paranasal sinuses: Mild mucosal thickening, most notable in the left sphenoid sinus. Orbits: Postoperative changes to the right globe. CTA NECK Aortic arch: 3 vessel aortic arch with mild atherosclerotic calcification. Brachiocephalic and subclavian arteries are widely patent with mild non stenotic plaque. Right carotid system: Patent with mild mixed calcified and noncalcified plaque at the carotid bifurcation and in the proximal ICA. No significant stenosis. Left carotid system: Patent with minimal atherosclerotic luminal irregularity. No stenosis. Vertebral arteries:Patent and codominant without stenosis. Skeleton: Mild cervical spondylosis. Other neck: Poor dentition with multiple periapical lucencies and missing teeth. Endotracheal and enteric tubes with small volume pharyngeal fluid. Extensive consolidative opacities partially visualized in the lung apices. Pleural effusions partially visualized. Left jugular venous catheter terminating in the proximal SVC. CTA HEAD Anterior circulation: Internal carotid arteries are patent from skullbase to carotid termini. There is mild bilateral carotid siphon atherosclerosis without significant stenosis. ACAs and MCAs are patent without evidence of major branch occlusion or significant proximal stenosis. There is an early MCA bifurcation on the right. No intracranial aneurysm is identified. Posterior circulation: Intracranial vertebral arteries are widely patent to the vertebrobasilar junction. PICA, AICA, and SCA origins are patent. Basilar artery is patent without stenosis. Posterior communicating arteries are not clearly identified. PCAs are patent without evidence of significant stenosis. Venous sinuses: Patent. Anatomic variants: None. Delayed phase: No abnormal enhancement.  IMPRESSION: 1. Very mild intracranial and extracranial atherosclerosis without evidence of major vessel occlusion or significant stenosis. 2. Partially visualized bilateral pleural effusions and extensive bilateral lung consolidation which may represent ARDS. Electronically Signed   By: Sebastian Ache M.D.   On: 10/05/2015 13:40   Dg Chest Port 1 View  10/07/2015  CLINICAL DATA:  Respiratory failure. EXAM: PORTABLE CHEST 1 VIEW COMPARISON:  October 06, 2015. FINDINGS: Stable cardiomediastinal silhouette. Endotracheal and nasogastric tubes are unchanged in position. Left internal jugular catheter is unchanged in position. No pneumothorax or significant pleural effusion is noted. Bilateral upper lobe opacities are improved compared to prior exam. Stable bibasilar opacities are noted consistent with edema or pneumonia. IMPRESSION: Stable support apparatus. Improved bilateral upper lobe opacities are noted, with stable bibasilar opacities, concerning for edema or pneumonia. Electronically Signed   By: Lupita Raider, M.D.   On: 10/07/2015 07:20   Dg Chest Port 1 View  10/06/2015  CLINICAL DATA:  Hypoxia EXAM: PORTABLE CHEST 1 VIEW COMPARISON:  October 05, 2015 FINDINGS: Endotracheal tube tip is 3.6 cm above the carina. Central catheter tip is in the superior vena cava. Nasogastric tube tip and side port below the diaphragm. No pneumothorax. There is extensive interstitial and alveolar edema bilaterally, stable. Heart is mildly enlarged with pulmonary venous hypertension. No new opacity. No adenopathy evident. IMPRESSION: Tube and catheter positions as described without pneumothorax. Widespread interstitial and  alveolar edema, stable. No new opacity. The appearance is consistent with congestive heart failure, although superimposed pneumonia cannot be excluded radiographically. Both entities may exist concurrently. No change in cardiac silhouette. Electronically Signed   By: Bretta Bang III M.D.   On: 10/06/2015  07:15    TELE NSR  ECHO On my review, LVEF 20-25%, not 30-35% as reported. There is no LV clot seen.  ECG NSR, evolving T wave inversion inferolateral leads, borderline QTc  ASSESSMENT AND PLAN   1. S/P cardiac arrest - witnessed, relatively short resuscitation, had early cooling. Would expect reasonably good neuro outcome, but there is evidence of a new stroke right frontal subcortical CVA. 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 Heavily sedated at this time, hard to assess neuro status. 2. S/p anterior STEMI and PCI-DES - have to continue ASA/ticagrelor. On low dose carvedilol. Will increase ACE inh., but use low dose to avoid hypotension in face of possible recent CVA. Renal function is normal. BP was low last night, but this was likely due to high doses of sedatives. At this point, he is not an appropriate candidate for AICD due to neurological issues. Reevaluate in 3 months. 3. Acute systolic heart failure/acute pulmonary edema - EF of 20-25% one week post MI/arrest, hopefully may still improve, but there appears to be extensive scar in the LAD artery territory. Note LV is already dilated, suggesting some degree of chronic cardiomyopathy. Will continue diuretics, increase ACEi. 3. Ventilator-dependent respiratory failure. Defer weaning attempts to Dr. Craige Cotta. He is a smoker. Need to resolve pulmonary edema before extubation.Diuretics increased. 4. New CVA (right frontal white matter infarct). Source could be cardioembolic in view of severe LV dysfunction, but defect is small and subcortical. CT angio without significant disease. No active LV thrombus.Even if clot not seen today, would be probably justified to anticoagulate x 12 months.Will wait until 30 days after stent, when we could stop aspirin and start warfarin. Source of anemia still not clarified, although rapid drop in Hgb suggests bleeding.  5. Anemia - Hgb now stable. Still no good explanation for sudden drop in  Hgb that occurred between 3/10 and 3/12 (cath on 3/7) without overt bleeding without hemodynamic instability, melena or hematemesis. Elevated haptoglobin excludes hemolysis. No physical findings to suggest retroperitoneal hematoma . If Hgb < 7, would transfuse. 6. Hypernatremia - resolved, stopped free water via NGT   Thurmon Fair, MD, [facility] [phone] office [phone] pager 10/07/2015 11:00 AM

## 2015-10-08 ENCOUNTER — Encounter (HOSPITAL_COMMUNITY): Payer: Self-pay | Admitting: Interventional Cardiology

## 2015-10-08 ENCOUNTER — Inpatient Hospital Stay (HOSPITAL_COMMUNITY): Payer: BLUE CROSS/BLUE SHIELD

## 2015-10-08 ENCOUNTER — Encounter (HOSPITAL_COMMUNITY)
Admission: EM | Disposition: A | Discharge status: Rehabilitation Facility | Payer: Self-pay | Source: Home / Self Care | Attending: Cardiovascular Disease

## 2015-10-08 DIAGNOSIS — E876 Hypokalemia: Secondary | ICD-10-CM

## 2015-10-08 DIAGNOSIS — D62 Acute posthemorrhagic anemia: Secondary | ICD-10-CM

## 2015-10-08 DIAGNOSIS — N179 Acute kidney failure, unspecified: Secondary | ICD-10-CM

## 2015-10-08 HISTORY — PX: CARDIAC CATHETERIZATION: SHX172

## 2015-10-08 LAB — CBC
HCT: 26.5 % — ABNORMAL LOW (ref 39.0–52.0)
Hemoglobin: 7.9 g/dL — ABNORMAL LOW (ref 13.0–17.0)
MCH: 23.6 pg — ABNORMAL LOW (ref 26.0–34.0)
MCHC: 29.8 g/dL — ABNORMAL LOW (ref 30.0–36.0)
MCV: 79.1 fL (ref 78.0–100.0)
PLATELETS: 376 10*3/uL (ref 150–400)
RBC: 3.35 MIL/uL — ABNORMAL LOW (ref 4.22–5.81)
RDW: 13.2 % (ref 11.5–15.5)
WBC: 9.8 10*3/uL (ref 4.0–10.5)

## 2015-10-08 LAB — BASIC METABOLIC PANEL
Anion gap: 14 (ref 5–15)
Anion gap: 11 (ref 5–15)
BUN: 30 mg/dL — AB (ref 6–20)
BUN: 35 mg/dL — AB (ref 6–20)
CHLORIDE: 100 mmol/L — AB (ref 101–111)
CO2: 34 mmol/L — ABNORMAL HIGH (ref 22–32)
CO2: 35 mmol/L — AB (ref 22–32)
CREATININE: 1.42 mg/dL — AB (ref 0.61–1.24)
CREATININE: 1.53 mg/dL — AB (ref 0.61–1.24)
Calcium: 8.4 mg/dL — ABNORMAL LOW (ref 8.9–10.3)
Calcium: 8.1 mg/dL — ABNORMAL LOW (ref 8.9–10.3)
Chloride: 99 mmol/L — ABNORMAL LOW (ref 101–111)
GFR calc Af Amer: 59 mL/min — ABNORMAL LOW (ref >60)
GFR calc Af Amer: 54 mL/min — ABNORMAL LOW (ref >60)
GFR calc non Af Amer: 46 mL/min — ABNORMAL LOW (ref >60)
GFR, EST NON AFRICAN AMERICAN: 51 mL/min — AB (ref >60)
Glucose, Bld: 188 mg/dL — ABNORMAL HIGH (ref 65–99)
Glucose, Bld: 199 mg/dL — ABNORMAL HIGH (ref 65–99)
POTASSIUM: 3.3 mmol/L — AB (ref 3.5–5.1)
POTASSIUM: 3.2 mmol/L — AB (ref 3.5–5.1)
SODIUM: 147 mmol/L — AB (ref 135–145)
SODIUM: 146 mmol/L — AB (ref 135–145)

## 2015-10-08 LAB — GLUCOSE, CAPILLARY
GLUCOSE-CAPILLARY: 182 mg/dL — AB (ref 65–99)
GLUCOSE-CAPILLARY: 203 mg/dL — AB (ref 65–99)
GLUCOSE-CAPILLARY: 213 mg/dL — AB (ref 65–99)
GLUCOSE-CAPILLARY: 205 mg/dL — AB (ref 65–99)
GLUCOSE-CAPILLARY: 246 mg/dL — AB (ref 65–99)
Glucose-Capillary: 246 mg/dL — ABNORMAL HIGH (ref 65–99)
Glucose-Capillary: 360 mg/dL — ABNORMAL HIGH (ref 65–99)

## 2015-10-08 SURGERY — LEFT HEART CATH AND CORONARY ANGIOGRAPHY
Anesthesia: LOCAL

## 2015-10-08 MED ORDER — POTASSIUM CHLORIDE 20 MEQ/15ML (10%) PO SOLN
40.0000 meq | Freq: Three times a day (TID) | ORAL | Status: AC
  Administered 2015-10-08 – 2015-10-09 (×5): 40 meq via ORAL
  Filled 2015-10-08 (×5): qty 30

## 2015-10-08 MED ORDER — HEPARIN (PORCINE) IN NACL 2-0.9 UNIT/ML-% IJ SOLN
INTRAMUSCULAR | Status: AC
  Filled 2015-10-08: qty 1000

## 2015-10-08 MED ORDER — NITROGLYCERIN IN D5W 200-5 MCG/ML-% IV SOLN: 10.0000 ug/min | INTRAVENOUS | Status: DC

## 2015-10-08 MED ORDER — FUROSEMIDE 10 MG/ML IJ SOLN
80.0000 mg | Freq: Every day | INTRAMUSCULAR | Status: DC
  Administered 2015-10-09 – 2015-10-10 (×2): 80 mg via INTRAVENOUS
  Filled 2015-10-08 (×3): qty 8

## 2015-10-08 MED ORDER — SODIUM CHLORIDE 0.9 % WEIGHT BASED INFUSION
1.0000 mL/kg/h | INTRAVENOUS | Status: AC
Start: 2015-10-08 — End: 2015-10-08
  Administered 2015-10-08: 1 mL/kg/h via INTRAVENOUS

## 2015-10-08 MED ORDER — LIDOCAINE HCL (PF) 1 % IJ SOLN
INTRAMUSCULAR | Status: DC | PRN
  Administered 2015-10-08: 10:00:00

## 2015-10-08 MED ORDER — SODIUM CHLORIDE 0.9 % IV SOLN: 250.0000 mL | INTRAVENOUS | Status: DC | PRN

## 2015-10-08 MED ORDER — NITROGLYCERIN 1 MG/10 ML FOR IR/CATH LAB
INTRA_ARTERIAL | Status: AC
  Filled 2015-10-08: qty 10

## 2015-10-08 MED ORDER — FREE WATER
100.0000 mL | Freq: Three times a day (TID) | Status: DC
  Administered 2015-10-08 – 2015-10-09 (×4): 100 mL

## 2015-10-08 MED ORDER — LIDOCAINE HCL (PF) 1 % IJ SOLN
INTRAMUSCULAR | Status: AC
  Filled 2015-10-08: qty 30

## 2015-10-08 MED ORDER — ACETAMINOPHEN 325 MG PO TABS: 650.0000 mg | ORAL_TABLET | ORAL | Status: DC | PRN

## 2015-10-08 MED ORDER — SODIUM CHLORIDE 0.9 % IV SOLN: INTRAVENOUS | Status: DC | PRN

## 2015-10-08 MED ORDER — ONDANSETRON HCL 4 MG/2ML IJ SOLN: 4.0000 mg | Freq: Four times a day (QID) | INTRAMUSCULAR | Status: DC | PRN

## 2015-10-08 MED ORDER — SODIUM CHLORIDE 0.9% FLUSH
3.0000 mL | Freq: Two times a day (BID) | INTRAVENOUS | Status: DC
  Administered 2015-10-08 – 2015-10-11 (×6): 3 mL via INTRAVENOUS
  Administered 2015-10-11: 10:00:00 via INTRAVENOUS
  Administered 2015-10-12 – 2015-10-15 (×7): 3 mL via INTRAVENOUS

## 2015-10-08 MED ORDER — IOHEXOL 350 MG/ML SOLN
INTRAVENOUS | Status: DC | PRN
  Administered 2015-10-08: 100 mL via INTRA_ARTERIAL

## 2015-10-08 MED ORDER — HEPARIN (PORCINE) IN NACL 100-0.45 UNIT/ML-% IJ SOLN
1100.0000 [IU]/h | INTRAMUSCULAR | Status: DC
  Administered 2015-10-08: 900 [IU]/h via INTRAVENOUS
  Filled 2015-10-08: qty 250

## 2015-10-08 MED ORDER — SODIUM CHLORIDE 0.9% FLUSH: 3.0000 mL | INTRAVENOUS | Status: DC | PRN

## 2015-10-08 SURGICAL SUPPLY — 11 items
CATH VISTA GUIDE 6FR XBLAD3.5 (CATHETERS) ×2 IMPLANT
GUIDE CATH RUNWAY 6FR FR4 (CATHETERS) ×2 IMPLANT
HOVERMATT SINGLE USE (MISCELLANEOUS) ×2 IMPLANT
KIT ENCORE 26 ADVANTAGE (KITS) ×2 IMPLANT
KIT HEART LEFT (KITS) ×2 IMPLANT
PACK CARDIAC CATHETERIZATION (CUSTOM PROCEDURE TRAY) ×2 IMPLANT
SHEATH PINNACLE 6F 10CM (SHEATH) ×2 IMPLANT
TRANSDUCER W/STOPCOCK (MISCELLANEOUS) ×2 IMPLANT
TUBING CIL FLEX 10 FLL-RA (TUBING) ×2 IMPLANT
WIRE EMERALD 3MM-J .035X150CM (WIRE) ×2 IMPLANT
WIRE RUNTHROUGH .014X180CM (WIRE) ×2 IMPLANT

## 2015-10-08 NOTE — Progress Notes (Signed)
ANTICOAGULATION CONSULT NOTE - Follow Up Consult  Pharmacy Consult for heparin Indication: chest pain/ACS  No Known Allergies  Patient Measurements: Height: 5\' 7"  (170.2 cm) Weight: 148 lb 5.9 oz (67.3 kg) IBW/kg (Calculated) : 66.1 Heparin Dosing Weight:   Vital Signs: Temp: 98.3 F (36.8 C) (03/17 0400) Temp Source: Oral (03/17 0400) BP: 78/56 mmHg (03/17 1210) Pulse Rate: 94 (03/17 1210)  Labs:  Recent Labs  10/06/15 0355 10/07/15 0515 10/08/15 0438  HGB 7.4* 7.8* 7.9*  HCT 24.8* 25.1* 26.5*  PLT 274 326 376  CREATININE 1.06 1.09 1.42*    Estimated Creatinine Clearance: 49.1 mL/min (by C-G formula based on Cr of 1.42).  Assessment: 64 yoM intially w/ CP then PEA w/ anterior MI s/p cath with DES placed to prox LAD. New concerns for possible re-infarct this am with ST elevation noted on tele/ecg. Taken back to cath lab this am and coronaries appear clean. Orders to start heparin after sheath pull and continue over weekend.  Goal of Therapy:  Heparin level 0.3-0.7 units/ml Monitor platelets by anticoagulation protocol: Yes   Plan:  Start heparin at 900/hr (1800 tonight) 6 hours HL Daily HL/CBC  Sheppard Coil PharmD., BCPS Clinical Pharmacist Pager [phone] 10/08/2015 2:14 PM

## 2015-10-08 NOTE — Progress Notes (Signed)
Inpatient Diabetes Program Recommendations  AACE/ADA: New Consensus Statement on Inpatient Glycemic Control (2015)  Target Ranges:  Prepandial:   less than 140 mg/dL      Peak postprandial:   less than 180 mg/dL (1-2 hours)      Critically ill patients:  140 - 180 mg/dL  Results for Eddie [name], Eddie [name] (MRN [id number]) as of 10/08/2015 11:16  Ref. Range 10/07/2015 04:10 10/07/2015 07:42 10/07/2015 11:42 10/07/2015 15:51 10/07/2015 20:35 10/08/2015 00:04 10/08/2015 04:37 10/08/2015 09:09  Glucose-Capillary Latest Ref Range: 65-99 mg/dL 051 (H) 102 (H) 111 (H) 190 (H) 263 (H) 360 (H) 182 (H) 203 (H)  Review of Glycemic Control  Current orders for Inpatient glycemic control: Lantus 10 units QHS, Novolog 0-15 units Q4H  Inpatient Diabetes Program Recommendations: Insulin - Basal: Please consider increasing Lantus to 13 units QHS (based on 67 kg x 0.2 units). Insulin - Meal Coverage: Please consider ordering Novolog 3 units Q4H for tube feeding coverage.    Thanks, Orlando Penner, RN, MSN, CDE Diabetes Coordinator Inpatient Diabetes Program [phone] (Team Pager from 8am to 5pm) [phone] ([facility] office) [phone] [facility] office) [phone] [facility] office)

## 2015-10-08 NOTE — Progress Notes (Addendum)
Patient Name: Eddie [name] Date of Encounter: 10/08/2015  Active Problems:   Cardiac arrest (HCC)   ST elevation myocardial infarction (STEMI) (HCC)   STEMI (ST elevation myocardial infarction) (HCC)   Encounter for central line placement   Hypokalemia   Encephalopathy acute   Acute hypoxemic respiratory failure (HCC)   Acute respiratory failure (HCC)   Altered mental status   Hemiplegia (HCC)   Stroke (HCC)   ARDS (adult respiratory distress syndrome) (HCC)   Absolute anemia   Length of Stay: 10  SUBJECTIVE  Sedated, agitated when sedation removed. Excellent UO. From bedside, suggestion of inferior ST elevation on telemetry, ECG ordered. Marked improvement in pulmonary edema on CXR.  CURRENT MEDS . antiseptic oral rinse  7 mL Mouth Rinse 10 times per day  . aspirin  81 mg Per Tube Daily  . atorvastatin  20 mg Oral q1800  . carvedilol  6.25 mg Per Tube BID WC  .  ceFAZolin (ANCEF) IV  2 g Intravenous 3 times per day  . chlorhexidine gluconate  15 mL Mouth Rinse BID  . furosemide  80 mg Intravenous BID  . heparin  5,000 Units Subcutaneous 3 times per day  . insulin aspart  0-15 Units Subcutaneous 6 times per day  . insulin glargine  10 Units Subcutaneous QHS  . lisinopril  20 mg Oral Daily  . pantoprazole sodium  40 mg Per Tube Daily  . potassium chloride  40 mEq Oral TID  . sennosides  5 mL Oral BID  . ticagrelor  90 mg Oral BID    OBJECTIVE   Intake/Output Summary (Last 24 hours) at 10/08/15 0840 Last data filed at 10/08/15 0600  Gross per 24 hour  Intake 2470.03 ml  Output   5330 ml  Net -2859.97 ml   Filed Weights   10/06/15 0500 10/07/15 0230 10/08/15 0400  Weight: 67.8 kg (149 lb 7.6 oz) 70.3 kg (154 lb 15.7 oz) 67.3 kg (148 lb 5.9 oz)    PHYSICAL EXAM Filed Vitals:   10/08/15 0400 10/08/15 0500 10/08/15 0600 10/08/15 0821  BP: 87/52  Pulse: 96 93 94 94  Temp: 98.3 F (36.8 C)     TempSrc: Oral     Resp: Height:       Weight: 67.3 kg (148 lb 5.9 oz)     SpO2: 98% 100% 99% 100%   General: sedated, intubated Head: no evidence of trauma, will not open eyes today, normal ears, nose and oropharynx Neck: normal jugular venous pulsations and no hepatojugular reflux; brisk carotid pulses without delay and no carotid bruits Chest: clear to auscultation, no signs of consolidation by percussion or palpation, normal fremitus, symmetrical and full respiratory excursions Cardiovascular: normal position and quality of the apical impulse, regular rhythm, normal first and second heart sounds, no rubs or gallops, no murmur Abdomen: no tenderness or distention, no masses by palpation, no abnormal pulsatility or arterial bruits, normal bowel sounds, no hepatosplenomegaly Extremities: no clubbing, cyanosis or edema; 2+ radial, ulnar and brachial pulses bilaterally; 2+ right femoral, posterior tibial and dorsalis pedis pulses; 2+ left femoral, posterior tibial and dorsalis pedis pulses; no subclavian or femoral bruits Neurological: not moving left arm/hand  LABS  CBC  Recent Labs  10/07/15 0515 10/08/15 0438  WBC 12.3* 9.8  HGB 7.8* 7.9*  HCT 25.1* 26.5*  MCV 79.9 79.1  PLT 326 376   Basic Metabolic Panel  Recent Labs  10/07/15 0515 10/08/15 0438  NA 143 147*  K 3.3* 3.3*  CL 105 99*  CO2 30 34*  GLUCOSE 269* 188*  BUN 28* 30*  CREATININE 1.09 1.42*  CALCIUM 7.9* 8.4*    No results for input(s): DDIMER in the last 72 hours. Hemoglobin A1C  Recent Labs  10/05/15 1522  HGBA1C 6.9*   Fasting Lipid Panel  Recent Labs  10/05/15 1522 10/06/15 0955  CHOL 136  --   HDL 19*  --   LDLCALC 99  --   TRIG 92 91  CHOLHDL 7.2  --    Thyroid Function Tests  Recent Labs  10/05/15 1522  TSH 0.196*    Radiology Studies Imaging results have been reviewed and Dg Chest Port 1 View  10/08/2015  CLINICAL DATA:  Respiratory failure EXAM: PORTABLE CHEST 1 VIEW COMPARISON:  Yesterday FINDINGS:  Endotracheal tube, NG tube, left jugular venous catheter are stable. Pulmonary edema has improved. No pneumothorax. No pleural effusion. IMPRESSION: Improved pulmonary edema. Electronically Signed   By: Jolaine Click M.D.   On: 10/08/2015 08:12   Dg Chest Port 1 View  10/07/2015  CLINICAL DATA:  Respiratory failure. EXAM: PORTABLE CHEST 1 VIEW COMPARISON:  October 06, 2015. FINDINGS: Stable cardiomediastinal silhouette. Endotracheal and nasogastric tubes are unchanged in position. Left internal jugular catheter is unchanged in position. No pneumothorax or significant pleural effusion is noted. Bilateral upper lobe opacities are improved compared to prior exam. Stable bibasilar opacities are noted consistent with edema or pneumonia. IMPRESSION: Stable support apparatus. Improved bilateral upper lobe opacities are noted, with stable bibasilar opacities, concerning for edema or pneumonia. Electronically Signed   By: Lupita Raider, M.D.   On: 10/07/2015 07:20    TELE NSR, looks like inferior ST elevation  ECG NSR  ASSESSMENT AND PLAN:   Recurrent anterior STEMI probably due to acute LAD stent thrombosis. Will take emergently to cath lab. May need IABP if unstable, but currently hemodynamically compensated. CONSENT PROVIDED OVER THE PHONE BY HIS WIFE, [name].  1. S/P cardiac arrest - witnessed, relatively short resuscitation, had early cooling. Would expect reasonably good neuro outcome, but there is evidence of a new stroke right frontal subcortical CVA. 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 Heavily sedated at this time, hard to assess neuro status. 2. S/p anterior STEMI and PCI-DES - has been on ASA/ticagrelor. On low dose carvedilol and ACE inh. At this point, he is not an appropriate candidate for AICD due to neurological issues. Reevaluate in 3 months. 3. Acute systolic heart failure/acute pulmonary edema - EF of 20-25% one week post MI/arrest, hopefully may still improve, but  there appears to be extensive scar in the LAD artery territory. Note LV is already dilated, suggesting some degree of chronic cardiomyopathy. Will reducediuretics. Renal function is now worse. BP again low last night, but this was might be due to high doses of sedatives. 3. Ventilator-dependent respiratory failure. Defer weaning attempts to Dr. Craige Cotta. He is a smoker. Need to resolve pulmonary edema before extubation.CXR better, but expect extubation will be further delayed by today's events. May need tracheostomy. 4. New CVA (right frontal white matter infarct). Source could be cardioembolic in view of severe LV dysfunction, but defect is small and subcortical. CT angio without significant disease. No active LV thrombus on echo.Even if clot not seen today, would be probably justified to anticoagulate x 12 months.Will wait until 30 days after stent, when we could stop aspirin and start warfarin. Source of anemia still not clarified, although rapid drop  in Hgb suggests bleeding.  5. Anemia - Hgb now stable. Still no good explanation for sudden drop in Hgb that occurred between 3/10 and 3/12 (cath on 3/7) without overt bleeding without hemodynamic instability, melena or hematemesis. Elevated haptoglobin excludes hemolysis. No physical findings to suggest retroperitoneal hematoma . If Hgb < 7, would transfuse. 6. Hypernatremia - recurrent after diuresis, restarted free water via NGT today. 7. Hypokalemia due to diuresis, replacing.  A total of 70 minutes was spent in direct patient care.  Thurmon Fair, MD, [facility] [phone] office [phone] pager 10/08/2015 8:40 AM

## 2015-10-08 NOTE — Progress Notes (Signed)
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   SUBJECTIVE/OVERNIGHT/INTERVAL HX 3/17 - improving cxr. Rising creat 1.4. Jsut back from cath lab - no intervention per RN. Agitated on WUA per RN.   VITAL SIGNS: BP 99/62 mmHg  Pulse 88  Temp(Src) 98.3 F (36.8 C) (Oral)  Resp 16  Ht 5\' 7"  (1.702 m)  Wt 67.3 kg (148 lb 5.9 oz)  BMI 23.23 kg/m2  SpO2 100%  VENTILATOR SETTINGS: Vent Mode:  [-] PRVC FiO2 (%):  [30 %] 30 % Set Rate:  [28 bmp] 28 bmp Vt Set:  [400 mL] 400 mL PEEP:  [5 cmH20] 5 cmH20 Pressure Support:  [8 cmH20-12 cmH20] 12 cmH20 Plateau Pressure:  [18 cmH20-19 cmH20] 18 cmH20  INTAKE / OUTPUT: I/O last 3 completed shifts: In: 4590.8 [I.V.:1490.8; NG/GT:2700; IV Piggyback:400] Out: 5955 [Urine:5955]   PHYSICAL EXAMINATION: General: on vent Neuro: RASS -1 on diprivan gtt (very agitated on WUA per RN) HEENT: ETT in place Cardiac: regular Chest: b/l crackles Abd: soft, non tender Ext: no edema Skin: no  rashes  LABS:  PULMONARY  Recent Labs Lab 10/06/15 1004 10/06/15 1055  PHART 7.458* 7.421  PCO2ART 41.5 47.1*  PO2ART 107.0* 108.0*  HCO3 29.5* 30.7*  TCO2 31 32  O2SAT 98.0 98.0    CBC  Recent Labs Lab 10/06/15 0355 10/07/15 0515 10/08/15 0438  HGB 7.4* 7.8* 7.9*  HCT 24.8* 25.1* 26.5*  WBC 12.2* 12.3* 9.8  PLT 274 326 376    COAGULATION No results for input(s): INR in the last 168 hours.  CARDIAC  No results for input(s): TROPONINI in the last 168 hours. No results for input(s): PROBNP in the last 168 hours.   CHEMISTRY  Recent Labs Lab 10/04/15 0500 10/05/15 0415 10/06/15 0355 10/07/15 0515 10/08/15 0438  NA 146* 148* 146* 143 147*  K 4.8 4.5 3.4* 3.3* 3.3*  CL 113* 112* 110 105 99*  CO2 24 26 29 30  34*  GLUCOSE 224* 110* 225* 269* 188*  BUN 30* 27* 29* 28* 30*  CREATININE 0.99 0.99 1.06 1.09 1.42*  CALCIUM 8.3* 8.4* 8.0* 7.9* 8.4*   Estimated Creatinine Clearance: 49.1 mL/min (by C-G formula based on Cr of 1.42).   LIVER  Recent Labs Lab 10/05/15 0415  AST 16  ALT 10*  ALKPHOS 72  BILITOT 1.1  PROT 5.8*  ALBUMIN 1.9*     INFECTIOUS No results for input(s): LATICACIDVEN, PROCALCITON in the last 168 hours.   ENDOCRINE  CBG (last 3)   Recent Labs  10/08/15 0004 10/08/15 0437 10/08/15 0909  GLUCAP 360* 182* 203*         IMAGING x48h  - image(s) personally visualized  -   highlighted in bold Dg Chest Port 1 View  10/08/2015  CLINICAL DATA:  Respiratory failure EXAM: PORTABLE CHEST 1 VIEW COMPARISON:  Yesterday FINDINGS: Endotracheal tube, NG tube, left jugular venous catheter are stable. Pulmonary edema has improved. No pneumothorax. No pleural effusion. IMPRESSION: Improved pulmonary edema. Electronically Signed   By: Jolaine Click M.D.   On: 10/08/2015 08:12   Dg Chest Port 1 View  10/07/2015  CLINICAL DATA:  Respiratory failure. EXAM: PORTABLE CHEST 1 VIEW COMPARISON:  October 06, 2015. FINDINGS: Stable cardiomediastinal  silhouette. Endotracheal and nasogastric tubes are unchanged in position. Left internal jugular catheter is unchanged in position. No pneumothorax or significant pleural effusion is noted. Bilateral upper lobe opacities are improved compared to prior exam. Stable bibasilar opacities are noted consistent with edema or pneumonia. IMPRESSION: Stable support apparatus. Improved bilateral upper lobe opacities are noted, with stable bibasilar opacities, concerning for edema or pneumonia. Electronically Signed   By: Lupita Raider, M.D.   On: 10/07/2015 07:20      DISCUSSION: 65 yo male with dyspnea, chest pain from STEMI >> cardiac arrest in ER with 6 minutes before ROSC.  Had PCI with DES to LAD.  ASSESSMENT / PLAN:  CARDIOVASCULAR A:  STEMI. Acute systolic CHF. Hypotension 3/15 2nd to sedation.   P:  ASA, coreg, lisinopril, brilinta, lipitor Wean off levophed to keep MAP > 65  PULMONARY A: Acute respiratory failure from acute pulmonary edema, HCAP, ARDS.   - does not meet sbt criteria due to agitation   P:   PRVC Pressure support wean as tolerated >> not ready for extubation F/u CXR Lasix 40 mg IV x 2 on 3/16 Might need trach to assist with vent weaning  RENAL A:   Hypokalemia >> improved. Hypernatremia.   - MILD AKI 3/17 pre-cath P:   rechck bmet at 3pm post cath and daily Correct electrolytes as indicated D/c'ed free water 3/16   GASTROINTESTINAL A:   Hx PUD. Nutrition. P:   Tube feeds while on vent Protonix for SUP  HEMATOLOGIC A:   Anemia of critical illness >> no evidence for bleeding. P:  F/u CBC SQ heparin Transfuse for Hb < 7  INFECTIOUS A:   HCAP with MSSA. P:   Day 5 of abx, changed to ancef 3/16  ENDOCRINE A:   Hyperglycemia - no hx of DM.    P:   SSI Increased lantus to 10 units on 3/16  NEUROLOGIC A:   Acute encephalopathy after cardiac arrest. Acute CVA 3/13.   -ongoing agitation during WUA P:   RASS goal -1      The  patient is critically ill with multiple organ systems failure and requires high complexity decision making for assessment and support, frequent evaluation and titration of therapies, application of advanced monitoring technologies and extensive interpretation of multiple databases.   Critical Care Time devoted to patient care services described in this note is  30  Minutes. This time reflects time of care of this signee Dr Kalman Shan. This critical care time does not reflect procedure time, or teaching time or supervisory time of PA/NP/Med student/Med Resident etc but could involve care discussion time    Dr. Kalman Shan, M.D., [facility].C.P Pulmonary and Critical Care Medicine Staff Physician [facility] [place]  Pulmonary and Critical Care Pager: [phone], If no answer or between  15:00h - 7:00h: call [phone]  10/08/2015 10:58 AM

## 2015-10-08 NOTE — Progress Notes (Signed)
EKG CRITICAL VALUE     12 lead EKG performed.  Critical value noted.  Archie Patten, RN notified.   Oda Cogan, CCT 10/08/2015 8:55 AM

## 2015-10-09 ENCOUNTER — Inpatient Hospital Stay (HOSPITAL_COMMUNITY): Payer: BLUE CROSS/BLUE SHIELD

## 2015-10-09 DIAGNOSIS — I5021 Acute systolic (congestive) heart failure: Secondary | ICD-10-CM

## 2015-10-09 DIAGNOSIS — R401 Stupor: Secondary | ICD-10-CM

## 2015-10-09 LAB — GLUCOSE, CAPILLARY
GLUCOSE-CAPILLARY: 206 mg/dL — AB (ref 65–99)
GLUCOSE-CAPILLARY: 227 mg/dL — AB (ref 65–99)
GLUCOSE-CAPILLARY: 242 mg/dL — AB (ref 65–99)
Glucose-Capillary: 204 mg/dL — ABNORMAL HIGH (ref 65–99)
Glucose-Capillary: 229 mg/dL — ABNORMAL HIGH (ref 65–99)

## 2015-10-09 LAB — CBC WITH DIFFERENTIAL/PLATELET
BASOS ABS: 0 10*3/uL (ref 0.0–0.1)
Basophils Relative: 0 %
EOS ABS: 0.4 10*3/uL (ref 0.0–0.7)
Eosinophils Relative: 3 %
HCT: 24.3 % — ABNORMAL LOW (ref 39.0–52.0)
Hemoglobin: 7.6 g/dL — ABNORMAL LOW (ref 13.0–17.0)
Lymphocytes Relative: 14 %
Lymphs Abs: 2 10*3/uL (ref 0.7–4.0)
MCH: 24.6 pg — ABNORMAL LOW (ref 26.0–34.0)
MCHC: 31.3 g/dL (ref 30.0–36.0)
MCV: 78.6 fL (ref 78.0–100.0)
MONO ABS: 0.6 10*3/uL (ref 0.1–1.0)
Monocytes Relative: 4 %
NEUTROS PCT: 79 %
Neutro Abs: 11.4 10*3/uL — ABNORMAL HIGH (ref 1.7–7.7)
PLATELETS: 447 10*3/uL — AB (ref 150–400)
RBC: 3.09 MIL/uL — AB (ref 4.22–5.81)
RDW: 13.3 % (ref 11.5–15.5)
WBC: 14.4 10*3/uL — AB (ref 4.0–10.5)

## 2015-10-09 LAB — PHOSPHORUS: Phosphorus: 2.3 mg/dL — ABNORMAL LOW (ref 2.5–4.6)

## 2015-10-09 LAB — BASIC METABOLIC PANEL
ANION GAP: 14 (ref 5–15)
BUN: 43 mg/dL — AB (ref 6–20)
CO2: 34 mmol/L — ABNORMAL HIGH (ref 22–32)
Calcium: 8.4 mg/dL — ABNORMAL LOW (ref 8.9–10.3)
Chloride: 101 mmol/L (ref 101–111)
Creatinine, Ser: 1.65 mg/dL — ABNORMAL HIGH (ref 0.61–1.24)
GFR calc Af Amer: 49 mL/min — ABNORMAL LOW (ref >60)
GFR, EST NON AFRICAN AMERICAN: 42 mL/min — AB (ref >60)
Glucose, Bld: 212 mg/dL — ABNORMAL HIGH (ref 65–99)
POTASSIUM: 3.6 mmol/L (ref 3.5–5.1)
SODIUM: 149 mmol/L — AB (ref 135–145)

## 2015-10-09 LAB — MAGNESIUM: MAGNESIUM: 2.4 mg/dL (ref 1.7–2.4)

## 2015-10-09 LAB — HEPARIN LEVEL (UNFRACTIONATED): Heparin Unfractionated: 0.17 IU/mL — ABNORMAL LOW (ref 0.30–0.70)

## 2015-10-09 LAB — TRIGLYCERIDES: TRIGLYCERIDES: 75 mg/dL (ref <150)

## 2015-10-09 MED ORDER — ATORVASTATIN CALCIUM 80 MG PO TABS
80.0000 mg | ORAL_TABLET | Freq: Every day | ORAL | Status: DC
  Administered 2015-10-09 – 2015-10-15 (×5): 80 mg via ORAL
  Filled 2015-10-09 (×7): qty 1

## 2015-10-09 MED ORDER — BISACODYL 10 MG RE SUPP
10.0000 mg | Freq: Once | RECTAL | Status: AC
  Administered 2015-10-09: 10 mg via RECTAL
  Filled 2015-10-09: qty 1

## 2015-10-09 MED ORDER — HEPARIN SODIUM (PORCINE) 5000 UNIT/ML IJ SOLN
5000.0000 [IU] | Freq: Three times a day (TID) | INTRAMUSCULAR | Status: DC
  Administered 2015-10-09 – 2015-10-15 (×19): 5000 [IU] via SUBCUTANEOUS
  Filled 2015-10-09 (×19): qty 1

## 2015-10-09 MED ORDER — FREE WATER
200.0000 mL | Freq: Three times a day (TID) | Status: DC
  Administered 2015-10-09 – 2015-10-11 (×6): 200 mL

## 2015-10-09 MED ORDER — LISINOPRIL 2.5 MG PO TABS
2.5000 mg | ORAL_TABLET | Freq: Every day | ORAL | Status: DC
  Administered 2015-10-09 – 2015-10-15 (×6): 2.5 mg via ORAL
  Filled 2015-10-09 (×6): qty 1

## 2015-10-09 MED ORDER — CARVEDILOL 3.125 MG PO TABS
3.1250 mg | ORAL_TABLET | Freq: Two times a day (BID) | ORAL | Status: DC
  Administered 2015-10-09: 3.125 mg
  Filled 2015-10-09: qty 1

## 2015-10-09 NOTE — Progress Notes (Signed)
ANTICOAGULATION CONSULT NOTE - Follow Up Consult  Pharmacy Consult for heparin Indication: chest pain/ACS  No Known Allergies  Patient Measurements: Height: 5\' 7"  (170.2 cm) Weight: 148 lb 5.9 oz (67.3 kg) IBW/kg (Calculated) : 66.1 Heparin Dosing Weight:   Vital Signs: Temp: 97.8 F (36.6 C) (03/18 0000) Temp Source: Oral (03/18 0000) BP: 109/59 mmHg (03/18 0200) Pulse Rate: 88 (03/18 0200)  Labs:  Recent Labs  10/06/15 0355 10/07/15 0515 10/08/15 0438 10/08/15 1500 10/09/15 0120  HGB 7.4* 7.8* 7.9*  --   --   HCT 24.8* 25.1* 26.5*  --   --   PLT 274 326 376  --   --   HEPARINUNFRC  --   --   --   --  0.17*  CREATININE 1.06 1.09 1.42* 1.53*  --     Estimated Creatinine Clearance: 45.6 mL/min (by C-G formula based on Cr of 1.53).  Assessment: 66 yo Male s/p PCI 3/7, new EKG changes s/p cath 3/17, for heparin Goal of Therapy:  Heparin level 0.3-0.7 units/ml Monitor platelets by anticoagulation protocol: Yes   Plan:  Increase Heparin 1100 units/hr Check heparin level in 8 hours.  Geannie Risen, PharmD, BCPS   10/09/2015 2:36 AM

## 2015-10-09 NOTE — Progress Notes (Signed)
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 Patient on Versed, propofol, & fentanyl infusions. Not following commands.  REVIEW OF SYSTEMS: Unable to obtain due to intubation and sedation.  VITAL SIGNS: BP 124/72 mmHg  Pulse 92  Temp(Src) 99.6 F (37.6 C) (Oral)  Resp 28  Ht 5\' 7"  (1.702 m)  Wt 152 lb 1.9 oz (69 kg)  BMI 23.82 kg/m2  SpO2 100%  VENTILATOR SETTINGS: Vent Mode:  [-] PRVC FiO2 (%):  [30 %] 30 % Set Rate:  [28 bmp] 28 bmp Vt Set:  [400 mL] 400 mL PEEP:  [5 cmH20] 5 cmH20 Plateau Pressure:  [15 cmH20-21 cmH20] 17 cmH20  INTAKE / OUTPUT: I/O last 3 completed shifts: In: 4806.8 [I.V.:1766.8; NG/GT:2740; IV Piggyback:300] Out: 2155 [Urine:2155]   PHYSICAL EXAMINATION: General:  Eyes open. No acute distress. No family at bedside.  Integument:  Warm & dry. No rash on exposed skin.   HEENT:  Moist mucus membranes. Poor dentition. Endotracheal tube in place. No scleral icterus. Cardiovascular:  Regular rate. Normal S1 & S2. No appreciable JVD.  Pulmonary:  Clear to auscultation bilaterally. Symmetric chest wall rise on ventilator. Abdomen: Soft. Normal bowel sounds. Hypoactive bowel sounds. Neurological: Doesn't attend to voice or not to questions. Patient doesn't follow commands. Eyes are open.  LABS:  PULMONARY  Recent Labs Lab 10/06/15 1004 10/06/15 1055  PHART 7.458* 7.421  PCO2ART 41.5 47.1*  PO2ART 107.0* 108.0*  HCO3 29.5* 30.7*  TCO2 31 32  O2SAT 98.0 98.0    CBC  Recent Labs Lab 10/07/15 0515 10/08/15 0438 10/09/15 0400  HGB 7.8* 7.9* 7.6*  HCT 25.1* 26.5* 24.3*  WBC 12.3* 9.8 14.4*  PLT 326 376 447*    COAGULATION No results for input(s): INR in the last 168 hours.  CARDIAC  No results for input(s): TROPONINI in the last 168 hours. No results for input(s): PROBNP in the last 168 hours.   CHEMISTRY  Recent Labs Lab 10/06/15 0355 10/07/15 0515 10/08/15 0438 10/08/15 1500 10/09/15 0400  NA 146* 143 147* 146* 149*  K 3.4* 3.3* 3.3* 3.2* 3.6  CL 110 105 99* 100* 101  CO2 29 30 34* 35* 34*  GLUCOSE 225* 269* 188* 199* 212*  BUN 29* 28* 30* 35* 43*  CREATININE 1.06 1.09 1.42* 1.53* 1.65*  CALCIUM 8.0* 7.9* 8.4* 8.1* 8.4*  MG  --   --   --   --  2.4  PHOS  --   --   --   --  2.3*   Estimated Creatinine Clearance: 42.3 mL/min (by C-G formula based on Cr of 1.65).   LIVER  Recent Labs Lab 10/05/15 0415  AST 16  ALT 10*  ALKPHOS 72  BILITOT 1.1  PROT 5.8*  ALBUMIN 1.9*     INFECTIOUS No results for input(s): LATICACIDVEN, PROCALCITON in the last 168 hours.   ENDOCRINE CBG (last 3)   Recent Labs  10/09/15 0355 10/09/15 0732 10/09/15 1219  GLUCAP 206* 204* 229*         IMAGING x48h  - image(s) personally visualized  -   highlighted in bold Dg Chest Port 1 View  10/09/2015  CLINICAL DATA:   Intubated EXAM: PORTABLE CHEST 1 VIEW COMPARISON:  Chest radiograph from one day prior. FINDINGS: Endotracheal tube tip is 7 cm above the carina at the thoracic inlet. Enteric tube enters the stomach with the tip not seen on this image. Left internal jugular central venous catheter terminates in the upper third of the superior vena cava. Stable cardiomediastinal silhouette with normal heart size. No pneumothorax. No pleural effusion. No overt pulmonary edema. No consolidative airspace disease. IMPRESSION: 1. Endotracheal tube tip is 7 cm above the carina at the thoracic inlet, consider advancing 2-3 cm . 2. No overt pulmonary edema.  No consolidative airspace disease. Electronically Signed   By: Delbert Phenix M.D.   On: 10/09/2015 09:36   Dg Chest Port 1 View  10/08/2015  CLINICAL DATA:  Respiratory failure EXAM: PORTABLE CHEST 1 VIEW COMPARISON:  Yesterday FINDINGS: Endotracheal tube, NG tube, left jugular venous catheter are stable. Pulmonary edema has improved. No pneumothorax. No pleural effusion. IMPRESSION: Improved pulmonary edema. Electronically Signed   By: Jolaine Click M.D.   On: 10/08/2015 08:12    ASSESSMENT / PLAN:  CARDIOVASCULAR A:  STEMI. Acute Systolic CHF Shock - Secondary to sedation. Resolved.  P:  Management per primary serivce Lasix IV daily ASA 81mg  daily Coreg 3.125mg  bid Lisinopril 2.5mg  daily Brilinta bid Lipitor qhs  PULMONARY A: Acute Hypoxic Respiratory Failure HCAP ARDS  P:   Full Vent Support WUA & SBT as mental status allows Diuresis per primary service  RENAL A:   Hypernatremia - Worsening. Acute on Chronic Renal Failure - Worsening. Hypokalemia - Resolved.  P:   Monitoring UOP with foley Trending renal function daily along w/ electrolytes Increase Free Water to 200cc q8hr VT Replace electrolytes as indicated  GASTROINTESTINAL A:   Constipation - No BM. H/O PUD  P:   Continuing tube feedings Protonix VT daily Senna bid VT Dulcolax  Supp x1  HEMATOLOGIC A:   Anemia - No signs of active bleeding. Likely due to chronic illness. Leukocytosis - Mild. Possibly due to hemoconcentration.   P:  Trending cell counts daily w/ CBC SQ heparin q8hr SCDs Transfuse for Hb < 7  INFECTIOUS A:   HCAP - MSSA.  P:   Day #6 total of antibiotics Changed to Ancef 3/16 Following cultures to completion Plan to re-culture for a fever  ENDOCRINE A:   Hyperglycemia - No H/O DM.     P:   SSI per algorithm Accu-Checks q4hr Lantus 10u qhs  NEUROLOGIC A:   Acute Encephalopathy - Post arrest. Question element of delirium. Acute CVA 3/13  P:   RASS goal:  0 to -1 Propofol gtt Fentanyl gtt D/C Versed  Family Discussion:  No family at bedside currently.   TODAY'S SUMMARY:  65 yo male with dyspnea, chest pain from STEMI >> cardiac arrest in ER with 6 minutes before ROSC.  Had PCI with DES to LAD. The patient's renal function and hypernatremia continue to worsen. I am increasing his free water today. Additionally, bowel sounds are slightly hypoactive. Trying Dulcolax suppository. Mental status is main barrier to extubation at this time. Discontinuing Versed infusion in favor of propofol & fentanyl.  I have spent a total of 36 minutes of critical care time today caring for the patient and reviewing the patient's electronic medical record.  Donna Christen Jamison Neighbor, M.D. [facility] Pulmonary & Critical Care Pager:  [phone] After 3pm or if no response, call [phone] 2:17 PM 10/09/2015

## 2015-10-09 NOTE — Progress Notes (Signed)
40cc versed drip (1mg  per mL) wasted in sink followed by water flush by 2 RN

## 2015-10-09 NOTE — Progress Notes (Signed)
EKG CRITICAL VALUE     12 lead EKG performed.  Critical value noted.  Lauren, RN notified.   Oda Cogan, CCT 10/09/2015 8:29 AM

## 2015-10-09 NOTE — Progress Notes (Addendum)
Patient ID: Eddie [name], male   DOB: 07-15-1951, 65 y.o.   MRN: [id number]    SUBJECTIVE:  Patient remains intubated.  He opens eyes but does not appear to follow commands.  SBP 90s-100s.  Creatinine mildly higher this morning.  I >>> O yesterday.   3/7 LHC with anterior STEMI: DES to LAD. 3/17 concern for stent occlusion with STE on ECG: repeat cath with patent LAD stent, 80% OM1, 65% mRCA (no change from prior).    ECG this am continues to show anterior STE.   Scheduled Meds: . antiseptic oral rinse  7 mL Mouth Rinse 10 times per day  . aspirin  81 mg Per Tube Daily  . atorvastatin  80 mg Oral q1800  . carvedilol  6.25 mg Per Tube BID WC  .  ceFAZolin (ANCEF) IV  2 g Intravenous 3 times per day  . chlorhexidine gluconate  15 mL Mouth Rinse BID  . free water  100 mL Per Tube 3 times per day  . furosemide  80 mg Intravenous Daily  . heparin subcutaneous  5,000 Units Subcutaneous 3 times per day  . insulin aspart  0-15 Units Subcutaneous 6 times per day  . insulin glargine  10 Units Subcutaneous QHS  . lisinopril  2.5 mg Oral Daily  . pantoprazole sodium  40 mg Per Tube Daily  . potassium chloride  40 mEq Oral TID  . sennosides  5 mL Oral BID  . sodium chloride flush  3 mL Intravenous Q12H  . ticagrelor  90 mg Oral BID   Continuous Infusions: . sodium chloride 10 mL/hr at 10/08/15 2000  . feeding supplement (VITAL AF 1.2 CAL) 1,000 mL (10/08/15 1159)  . fentaNYL infusion INTRAVENOUS 150 mcg/hr (10/09/15 0115)  . midazolam (VERSED) infusion 5 mg/hr (10/08/15 2254)  . nitroGLYCERIN    . norepinephrine (LEVOPHED) Adult infusion Stopped (10/08/15 0236)  . propofol (DIPRIVAN) infusion 20 mcg/kg/min (10/09/15 0730)   PRN Meds:.sodium chloride, acetaminophen, albuterol, fentaNYL, Influenza vac split quadrivalent PF, midazolam, ondansetron (ZOFRAN) IV, sodium chloride flush, sodium chloride flush    Filed Vitals:   10/09/15 0600 10/09/15 0700 10/09/15 0733 10/09/15 0800  BP: 85/58  90/58  105/59  Pulse: 85 84  90  Temp:   99 F (37.2 C)   TempSrc:   Oral   Resp: 28 28  28   Height:      Weight:      SpO2: 100% 100%  98%    Intake/Output Summary (Last 24 hours) at 10/09/15 0826 Last data filed at 10/09/15 0700  Gross per 24 hour  Intake 3198.08 ml  Output    955 ml  Net 2243.08 ml    LABS: Basic Metabolic Panel:  Recent Labs  78/24/23 1500 10/09/15 0400  NA 146* 149*  K 3.2* 3.6  CL 100* 101  CO2 35* 34*  GLUCOSE 199* 212*  BUN 35* 43*  CREATININE 1.53* 1.65*  CALCIUM 8.1* 8.4*  MG  --  2.4  PHOS  --  2.3*   Liver Function Tests: No results for input(s): AST, ALT, ALKPHOS, BILITOT, PROT, ALBUMIN in the last 72 hours. No results for input(s): LIPASE, AMYLASE in the last 72 hours. CBC:  Recent Labs  10/08/15 0438 10/09/15 0400  WBC 9.8 14.4*  NEUTROABS  --  11.4*  HGB 7.9* 7.6*  HCT 26.5* 24.3*  MCV 79.1 78.6  PLT 376 447*   Cardiac Enzymes: No results for input(s): CKTOTAL, CKMB, CKMBINDEX, TROPONINI in the last 72  hours. BNP: Invalid input(s): POCBNP D-Dimer: No results for input(s): DDIMER in the last 72 hours. Hemoglobin A1C: No results for input(s): HGBA1C in the last 72 hours. Fasting Lipid Panel:  Recent Labs  10/06/15 0955  TRIG 91   Thyroid Function Tests: No results for input(s): TSH, T4TOTAL, T3FREE, THYROIDAB in the last 72 hours.  Invalid input(s): FREET3 Anemia Panel: No results for input(s): VITAMINB12, FOLATE, FERRITIN, TIBC, IRON, RETICCTPCT in the last 72 hours.   PHYSICAL EXAM General: Intubated, sedated.  Neck: JVP difficult ?10 cm, no thyromegaly or thyroid nodule.  Lungs: Coarse BS bilaterally. CV: Nondisplaced PMI.  Heart regular S1/S2, no S3/S4, no murmur.  No peripheral edema.   Abdomen: Soft, no hepatosplenomegaly, no distention.  Neurologic: Opens eyes but does not appear to follow commands.   Extremities: No clubbing or cyanosis.   TELEMETRY: Reviewed telemetry pt in NSR  ASSESSMENT  AND PLAN: 65 yo with PEA arrest => hypothermia protocol and anterior STEMI s/p DES to LAD.  He was also found by MRI to have right frontal CVA.   1. CAD: Initial anterior STEMI with DES to LAD on 3/7.  On 3/17, noted to have worsening anterior STE and taken for relook cath (?re-occlusion of LAD stent).  This showed patent stent and stable disease. 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   - Think we can stop heparin gtt today, especially with low hemoglobin.  He will be on heparin Blue Eye for DVT prophylaxis.  - Continue ASA 81, Plavix, statin.  2. Acute systolic CHF: Most recent echo with EF 30-35%.  Exam difficult for volume overload, suspect at least mild fluid overload.  - Will follow CVP => set up today.  - Check co-ox.  - BP soft => decrease Coreg to 3.125 mg bid and lisinopril to 2.5 mg daily.  - Agree with Lasix 80 mg IV x 1, will follow CVP for repeat dosing.  - If he has neurologic recovery, will need Lifevest and repeat echo at 3 months to determine ICD.  3. CVA: Right frontal.  Possibly cardioembolic though LV thrombus not visualized.  Plan has been to stop ASA at 1 month and start warfarin for 1 year (in addition to Plavix).  4. Neuro: Opens eyes, does not appear to follow commands.  ?Anoxic encephalopathy.  5. Pulmonary: Suspect HCAP with MSSA in sputum.  Per CCM.  6. AKI: Creatinine mildly higher today.  Cut back on lisinopril and follow closely with diuresis.  Got contrast load yesterday.  7. Cardiac arrest: PEA in setting of STEMI.   40 minutes critical care time.   Marca Ancona 10/09/2015 8:38 AM

## 2015-10-10 DIAGNOSIS — J9601 Acute respiratory failure with hypoxia: Secondary | ICD-10-CM

## 2015-10-10 LAB — CBC WITH DIFFERENTIAL/PLATELET
BASOS PCT: 0 %
Basophils Absolute: 0 10*3/uL (ref 0.0–0.1)
EOS ABS: 0.3 10*3/uL (ref 0.0–0.7)
EOS PCT: 2 %
HEMATOCRIT: 24.5 % — AB (ref 39.0–52.0)
HEMOGLOBIN: 7.3 g/dL — AB (ref 13.0–17.0)
LYMPHS PCT: 15 %
Lymphs Abs: 2.1 10*3/uL (ref 0.7–4.0)
MCH: 23.8 pg — AB (ref 26.0–34.0)
MCHC: 29.8 g/dL — ABNORMAL LOW (ref 30.0–36.0)
MCV: 79.8 fL (ref 78.0–100.0)
MONOS PCT: 3 %
Monocytes Absolute: 0.4 10*3/uL (ref 0.1–1.0)
NEUTROS PCT: 80 %
Neutro Abs: 11.2 10*3/uL — ABNORMAL HIGH (ref 1.7–7.7)
Platelets: 475 10*3/uL — ABNORMAL HIGH (ref 150–400)
RBC: 3.07 MIL/uL — ABNORMAL LOW (ref 4.22–5.81)
RDW: 13.6 % (ref 11.5–15.5)
WBC: 14 10*3/uL — ABNORMAL HIGH (ref 4.0–10.5)

## 2015-10-10 LAB — RENAL FUNCTION PANEL
ANION GAP: 7 (ref 5–15)
Albumin: 1.8 g/dL — ABNORMAL LOW (ref 3.5–5.0)
BUN: 37 mg/dL — ABNORMAL HIGH (ref 6–20)
CALCIUM: 8 mg/dL — AB (ref 8.9–10.3)
CHLORIDE: 109 mmol/L (ref 101–111)
CO2: 30 mmol/L (ref 22–32)
CREATININE: 1.18 mg/dL (ref 0.61–1.24)
Glucose, Bld: 249 mg/dL — ABNORMAL HIGH (ref 65–99)
Phosphorus: 3.2 mg/dL (ref 2.5–4.6)
Potassium: 4.4 mmol/L (ref 3.5–5.1)
Sodium: 146 mmol/L — ABNORMAL HIGH (ref 135–145)

## 2015-10-10 LAB — GLUCOSE, CAPILLARY
GLUCOSE-CAPILLARY: 229 mg/dL — AB (ref 65–99)
GLUCOSE-CAPILLARY: 189 mg/dL — AB (ref 65–99)
GLUCOSE-CAPILLARY: 285 mg/dL — AB (ref 65–99)
Glucose-Capillary: 224 mg/dL — ABNORMAL HIGH (ref 65–99)
Glucose-Capillary: 246 mg/dL — ABNORMAL HIGH (ref 65–99)
Glucose-Capillary: 265 mg/dL — ABNORMAL HIGH (ref 65–99)
Glucose-Capillary: 234 mg/dL — ABNORMAL HIGH (ref 65–99)
Glucose-Capillary: 211 mg/dL — ABNORMAL HIGH (ref 65–99)

## 2015-10-10 LAB — TRIGLYCERIDES: TRIGLYCERIDES: 91 mg/dL (ref <150)

## 2015-10-10 LAB — HEPARIN LEVEL (UNFRACTIONATED): Heparin Unfractionated: <0.1 IU/mL — ABNORMAL LOW (ref 0.30–0.70)

## 2015-10-10 LAB — MAGNESIUM: MAGNESIUM: 2.3 mg/dL (ref 1.7–2.4)

## 2015-10-10 LAB — CARBOXYHEMOGLOBIN
Carboxyhemoglobin: 2.1 % — ABNORMAL HIGH (ref 0.5–1.5)
METHEMOGLOBIN: 0.7 % (ref 0.0–1.5)
O2 Saturation: 65.6 %
TOTAL HEMOGLOBIN: 7.8 g/dL — AB (ref 13.5–18.0)

## 2015-10-10 MED ORDER — SODIUM CHLORIDE 0.9 % IV BOLUS (SEPSIS)
500.0000 mL | Freq: Once | INTRAVENOUS | Status: AC
  Administered 2015-10-10: 500 mL via INTRAVENOUS

## 2015-10-10 MED ORDER — CARVEDILOL 6.25 MG PO TABS
6.2500 mg | ORAL_TABLET | Freq: Two times a day (BID) | ORAL | Status: DC
  Administered 2015-10-10 – 2015-10-15 (×8): 6.25 mg
  Filled 2015-10-10 (×10): qty 1

## 2015-10-10 MED ORDER — CARVEDILOL 6.25 MG PO TABS: 6.2500 mg | ORAL_TABLET | Freq: Two times a day (BID) | ORAL | Status: DC

## 2015-10-10 NOTE — Progress Notes (Addendum)
Patient ID: Eddie [name], male   DOB: 1950-11-26, 65 y.o.   MRN: [id number]    SUBJECTIVE:  Patient remains intubated.  Eyes are open but he is not following commands for me.  Creatinine down, I = O approximately yesterday.  CVP 7-8, co-ox 66%.   3/7 LHC with anterior STEMI: DES to LAD. 3/17 concern for stent occlusion with STE on ECG: repeat cath with patent LAD stent, 80% OM1, 65% mRCA (no change from prior).     Scheduled Meds: . antiseptic oral rinse  7 mL Mouth Rinse 10 times per day  . aspirin  81 mg Per Tube Daily  . atorvastatin  80 mg Oral q1800  . carvedilol  6.25 mg Per Tube BID WC  .  ceFAZolin (ANCEF) IV  2 g Intravenous 3 times per day  . chlorhexidine gluconate  15 mL Mouth Rinse BID  . free water  200 mL Per Tube 3 times per day  . furosemide  80 mg Intravenous Daily  . heparin subcutaneous  5,000 Units Subcutaneous 3 times per day  . insulin aspart  0-15 Units Subcutaneous 6 times per day  . insulin glargine  10 Units Subcutaneous QHS  . lisinopril  2.5 mg Oral Daily  . pantoprazole sodium  40 mg Per Tube Daily  . potassium chloride  40 mEq Oral TID  . sennosides  5 mL Oral BID  . sodium chloride flush  3 mL Intravenous Q12H  . ticagrelor  90 mg Oral BID   Continuous Infusions: . sodium chloride 10 mL/hr at 10/09/15 1800  . feeding supplement (VITAL AF 1.2 CAL) 1,000 mL (10/09/15 1820)  . fentaNYL infusion INTRAVENOUS 250 mcg/hr (10/10/15 0410)  . nitroGLYCERIN    . norepinephrine (LEVOPHED) Adult infusion Stopped (10/08/15 0236)  . propofol (DIPRIVAN) infusion 40 mcg/kg/min (10/10/15 0409)   PRN Meds:.sodium chloride, acetaminophen, albuterol, fentaNYL, Influenza vac split quadrivalent PF, ondansetron (ZOFRAN) IV, sodium chloride flush, sodium chloride flush    Filed Vitals:   10/10/15 0353 10/10/15 0400 10/10/15 0500 10/10/15 0600  BP:  96/67 95/72 123/76  Pulse:  86 83 84  Temp: 98.7 F (37.1 C)     TempSrc: Oral     Resp:  28 28 28   Height:        Weight:    147 lb 4.3 oz (66.8 kg)  SpO2:  100% 100% 100%    Intake/Output Summary (Last 24 hours) at 10/10/15 0825 Last data filed at 10/10/15 0600  Gross per 24 hour  Intake 2684.14 ml  Output   2275 ml  Net 409.14 ml    LABS: Basic Metabolic Panel:  Recent Labs  78/24/23 0400 10/10/15 0550  NA 149* 146*  K 3.6 4.4  CL 101 109  CO2 34* 30  GLUCOSE 212* 249*  BUN 43* 37*  CREATININE 1.65* 1.18  CALCIUM 8.4* 8.0*  MG 2.4 2.3  PHOS 2.3* 3.2   Liver Function Tests:  Recent Labs  10/10/15 0550  ALBUMIN 1.8*   No results for input(s): LIPASE, AMYLASE in the last 72 hours. CBC:  Recent Labs  10/09/15 0400 10/10/15 0550  WBC 14.4* 14.0*  NEUTROABS 11.4* PENDING  HGB 7.6* 7.3*  HCT 24.3* 24.5*  MCV 78.6 79.8  PLT 447* 475*   Cardiac Enzymes: No results for input(s): CKTOTAL, CKMB, CKMBINDEX, TROPONINI in the last 72 hours. BNP: Invalid input(s): POCBNP D-Dimer: No results for input(s): DDIMER in the last 72 hours. Hemoglobin A1C: No results for input(s): HGBA1C in  the last 72 hours. Fasting Lipid Panel:  Recent Labs  10/10/15 0550  TRIG 91   Thyroid Function Tests: No results for input(s): TSH, T4TOTAL, T3FREE, THYROIDAB in the last 72 hours.  Invalid input(s): FREET3 Anemia Panel: No results for input(s): VITAMINB12, FOLATE, FERRITIN, TIBC, IRON, RETICCTPCT in the last 72 hours.   PHYSICAL EXAM General: Intubated, eyes open.  Neck: JVP not elevated, no thyromegaly or thyroid nodule.  Lungs: Coarse BS bilaterally. CV: Nondisplaced PMI.  Heart regular S1/S2, no S3/S4, no murmur.  No peripheral edema.   Abdomen: Soft, no hepatosplenomegaly, no distention.  Neurologic: Opens eyes but does not appear to follow commands.   Extremities: No clubbing or cyanosis.   TELEMETRY: Reviewed telemetry pt in NSR  ASSESSMENT AND PLAN: 65 yo with PEA arrest => hypothermia protocol and anterior STEMI s/p DES to LAD.  He was also found by MRI to have right  frontal CVA.   1. CAD: Initial anterior STEMI with DES to LAD on 3/7.  On 3/17, noted to have worsening anterior STE and taken for relook cath (?re-occlusion of LAD stent).  This showed patent stent and stable disease. 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   - Continue ASA 81, Plavix, statin.  2. Acute systolic CHF: Most recent echo with EF 30-35%.  Co-ox 66% today with CVP 7-8.   - SBP running in the 110s range => can increase Coreg to 6.25 mg bid.  Continue current lisinopril.  If creatinine stays down, can add low dose spironolactone tomorrow.   - Would continue dosing Lasix 80 mg IV once daily to keep I/Os relatively matched.  CVP is good.   - If he has neurologic recovery, will need Lifevest and repeat echo at 3 months to determine ICD.  3. CVA: Right frontal.  Possibly cardioembolic though LV thrombus not visualized.  Plan has been to stop ASA at 1 month and start warfarin for 1 year (in addition to Plavix).  4. Neuro: Opens eyes, does not appear to follow commands.  ?Anoxic encephalopathy.  This is the main limitation to extubation at this point.  5. Pulmonary: Suspect HCAP with MSSA in sputum.  On cefazolin.  6. AKI: Creatinine better today.  Continue to follow.  7. Cardiac arrest: In setting of STEMI.  8. Anemia: No overt bleeding but hemoglobin low.  Transfuse hgb < 7.   Marca Ancona 10/10/2015 8:25 AM

## 2015-10-10 NOTE — Progress Notes (Signed)
PULMONARY / CRITICAL CARE MEDICINE   Name: Eddie [name] MRN: [id number] DOB: [birth year]    ADMISSION DATE:  09/28/2015 CONSULTATION DATE:  09/28/15  REFERRING MD:  Antoine Poche  CHIEF COMPLAINT:  Cardiac Arrest  STUDIES:  Cardiac Cath 03/07 > 100% LAD occlusion > DES placed Echo 3/07 >> EF 10 to 15%, grade 1 diastolic  CT head 3/10 >> no acute findings EEG 3/10 >> generalized slowing CT head 3/13 >> Rt frontal white matter low density MRI brain 3/14 >> acute small Rt frontal infarct, old Lt caudate and cerebellar infarcts   MICROBIOLOGY: Tracheal Asp Ctx 3/13:  MSSA Sputum Ctx 3/7:  Oral Flora MRSA PCR 3/7:  Negative   ANTIBIOTICS: Ancef 3/16>>> Vancomycin 3/13 - 3/16 Fortaz 3/13 - 3/16  LINES/TUBES: OETT 7.5 3/7>>> L IJ CVL 3/7>>> OGT 3/7>>> Foley 3/7>>> PIV x2 A line 03/07 - 3/11  SIGNIFICANT EVENTS: 03/07 PEA arrest > taken to cath lab then returned to ICU on vent  > hypothermia started  3/13 Possible CVA >> neuro consulted, increased respiratory secretions >> started ABx 3/15 ARDS protocol, pressors  SUBJECTIVE:  Tolerated 3 hours of PSV this am  REVIEW OF SYSTEMS: Unable to obtain due to intubation and sedation.  VITAL SIGNS: BP 124/71 mmHg  Pulse 85  Temp(Src) 98.7 F (37.1 C) (Oral)  Resp 28  Ht 5\' 7"  (1.702 m)  Wt 66.8 kg (147 lb 4.3 oz)  BMI 23.06 kg/m2  SpO2 99%  VENTILATOR SETTINGS: Vent Mode:  [-] PRVC FiO2 (%):  [30 %] 30 % Set Rate:  [28 bmp] 28 bmp Vt Set:  [400 mL-422 mL] 400 mL PEEP:  [5 cmH20] 5 cmH20 Pressure Support:  [10 cmH20] 10 cmH20 Plateau Pressure:  [17 cmH20-18 cmH20] 18 cmH20  INTAKE / OUTPUT: I/O last 3 completed shifts: In: 4832.4 [I.V.:1512.4; NG/GT:3020; IV Piggyback:300] Out: 2630 [Urine:2380; Emesis/NG output:250]   PHYSICAL EXAMINATION: General:  Eyes open. No acute distress.  Integument:  Warm & dry. No rash on exposed skin.  HEENT:  Moist mucus membranes. Poor dentition. Endotracheal tube in place. No scleral  icterus. Cardiovascular:  Regular rate. Normal S1 & S2. No appreciable JVD.  Pulmonary:  Clear to auscultation bilaterally. Symmetric chest wall rise on ventilator. Abdomen: Soft. Normal bowel sounds. Hypoactive bowel sounds. Neurological: Doesn't attend to voice or not to questions. Patient doesn't follow commands. Eyes are open.  LABS:  PULMONARY  Recent Labs Lab 10/06/15 1004 10/06/15 1055 10/10/15 0400  PHART 7.458* 7.421  --   PCO2ART 41.5 47.1*  --   PO2ART 107.0* 108.0*  --   HCO3 29.5* 30.7*  --   TCO2 31 32  --   O2SAT 98.0 98.0 65.6    CBC  Recent Labs Lab 10/08/15 0438 10/09/15 0400 10/10/15 0550  HGB 7.9* 7.6* 7.3*  HCT 26.5* 24.3* 24.5*  WBC 9.8 14.4* 14.0*  PLT 376 447* 475*    COAGULATION No results for input(s): INR in the last 168 hours.  CARDIAC  No results for input(s): TROPONINI in the last 168 hours. No results for input(s): PROBNP in the last 168 hours.   CHEMISTRY  Recent Labs Lab 10/07/15 0515 10/08/15 0438 10/08/15 1500 10/09/15 0400 10/10/15 0550  NA 143 147* 146* 149* 146*  K 3.3* 3.3* 3.2* 3.6 4.4  CL 105 99* 100* 101 109  CO2 30 34* 35* 34* 30  GLUCOSE 269* 188* 199* 212* 249*  BUN 28* 30* 35* 43* 37*  CREATININE 1.09 1.42* 1.53* 1.65* 1.18  CALCIUM  7.9* 8.4* 8.1* 8.4* 8.0*  MG  --   --   --  2.4 2.3  PHOS  --   --   --  2.3* 3.2   Estimated Creatinine Clearance: 59.1 mL/min (by C-G formula based on Cr of 1.18).   LIVER  Recent Labs Lab 10/05/15 0415 10/10/15 0550  AST 16  --   ALT 10*  --   ALKPHOS 72  --   BILITOT 1.1  --   PROT 5.8*  --   ALBUMIN 1.9* 1.8*     INFECTIOUS No results for input(s): LATICACIDVEN, PROCALCITON in the last 168 hours.   ENDOCRINE CBG (last 3)   Recent Labs  10/10/15 0348 10/10/15 0857 10/10/15 1117  GLUCAP 189* 285* 246*         IMAGING x48h  - image(s) personally visualized  -   highlighted in bold Dg Chest Port 1 View  10/09/2015  CLINICAL DATA:   Intubated EXAM: PORTABLE CHEST 1 VIEW COMPARISON:  Chest radiograph from one day prior. FINDINGS: Endotracheal tube tip is 7 cm above the carina at the thoracic inlet. Enteric tube enters the stomach with the tip not seen on this image. Left internal jugular central venous catheter terminates in the upper third of the superior vena cava. Stable cardiomediastinal silhouette with normal heart size. No pneumothorax. No pleural effusion. No overt pulmonary edema. No consolidative airspace disease. IMPRESSION: 1. Endotracheal tube tip is 7 cm above the carina at the thoracic inlet, consider advancing 2-3 cm . 2. No overt pulmonary edema.  No consolidative airspace disease. Electronically Signed   By: Delbert Phenix M.D.   On: 10/09/2015 09:36    ASSESSMENT / PLAN:  CARDIOVASCULAR A:  STEMI. Acute Systolic CHF Shock - Secondary to sedation. Resolved.  P:  Management per primary serivce Lasix IV daily ASA 81mg  daily Coreg 3.125mg  bid Lisinopril 2.5mg  daily Brilinta bid Lipitor qhs  PULMONARY A: Acute Hypoxic Respiratory Failure HCAP ARDS  P:   Full Vent Support WUA & SBT as mental status allows Diuresis per primary service  RENAL A:   Hypernatremia - Worsening. Acute on Chronic Renal Failure - Worsening. Hypokalemia - Resolved.  P:   Monitoring UOP with foley Trending renal function daily along w/ electrolytes Free Water to 200cc q8hr VT Replace electrolytes as indicated  GASTROINTESTINAL A:   Constipation - No BM. H/O PUD  P:   Continuing tube feedings Protonix VT daily Senna bid VT Dulcolax Supp x1  HEMATOLOGIC A:   Anemia - No signs of active bleeding. Likely due to chronic illness. Leukocytosis - Mild. Possibly due to hemoconcentration.   P:  Trending cell counts daily w/ CBC SQ heparin q8hr SCDs Transfuse for Hb < 7  INFECTIOUS A:   HCAP - MSSA.  P:   Day #7 total of antibiotics Changed to Ancef 3/16 Following cultures to completion Plan to  re-culture for a fever  ENDOCRINE A:   Hyperglycemia - No H/O DM.     P:   SSI per algorithm Accu-Checks q4hr Lantus 10u qhs  NEUROLOGIC A:   Acute Encephalopathy - Post arrest. Question element of delirium. Acute CVA 3/13  P:   RASS goal:  0 to -1 Propofol gtt Fentanyl gtt D/C Versed  Family Discussion:  No family at bedside currently.   TODAY'S SUMMARY:  65 yo male with dyspnea, chest pain from STEMI >> cardiac arrest in ER with 6 minutes before ROSC.  Had PCI with DES to LAD. The patient's renal  function and hypernatremia continue to worsen. I am increasing his free water today. Additionally, bowel sounds are slightly hypoactive. Trying Dulcolax suppository. Mental status is main barrier to extubation at this time.   I have spent a total of 33 minutes of critical care time today caring for the patient and reviewing the patient's electronic medical record.  Levy Pupa, MD, PhD 10/10/2015, 3:27 PM [facility] [phone] or if no answer [phone]

## 2015-10-11 ENCOUNTER — Inpatient Hospital Stay (HOSPITAL_COMMUNITY): Payer: BLUE CROSS/BLUE SHIELD

## 2015-10-11 DIAGNOSIS — J80 Acute respiratory distress syndrome: Secondary | ICD-10-CM

## 2015-10-11 LAB — CBC WITH DIFFERENTIAL/PLATELET
BASOS ABS: 0 10*3/uL (ref 0.0–0.1)
BASOS PCT: 0 %
EOS ABS: 0.4 10*3/uL (ref 0.0–0.7)
EOS PCT: 3 %
HCT: 24.3 % — ABNORMAL LOW (ref 39.0–52.0)
Hemoglobin: 7.2 g/dL — ABNORMAL LOW (ref 13.0–17.0)
LYMPHS PCT: 14 %
Lymphs Abs: 2.1 10*3/uL (ref 0.7–4.0)
MCH: 23.7 pg — ABNORMAL LOW (ref 26.0–34.0)
MCHC: 29.6 g/dL — ABNORMAL LOW (ref 30.0–36.0)
MCV: 79.9 fL (ref 78.0–100.0)
Monocytes Absolute: 0.5 10*3/uL (ref 0.1–1.0)
Monocytes Relative: 4 %
Neutro Abs: 11.6 10*3/uL — ABNORMAL HIGH (ref 1.7–7.7)
Neutrophils Relative %: 79 %
PLATELETS: 537 10*3/uL — AB (ref 150–400)
RBC: 3.04 MIL/uL — AB (ref 4.22–5.81)
RDW: 13.8 % (ref 11.5–15.5)
WBC: 14.7 10*3/uL — AB (ref 4.0–10.5)

## 2015-10-11 LAB — RENAL FUNCTION PANEL
ALBUMIN: 2 g/dL — AB (ref 3.5–5.0)
ANION GAP: 9 (ref 5–15)
BUN: 39 mg/dL — ABNORMAL HIGH (ref 6–20)
CALCIUM: 8.2 mg/dL — AB (ref 8.9–10.3)
CO2: 31 mmol/L (ref 22–32)
Chloride: 105 mmol/L (ref 101–111)
Creatinine, Ser: 1.18 mg/dL (ref 0.61–1.24)
GLUCOSE: 226 mg/dL — AB (ref 65–99)
PHOSPHORUS: 4.2 mg/dL (ref 2.5–4.6)
POTASSIUM: 4.1 mmol/L (ref 3.5–5.1)
Sodium: 145 mmol/L (ref 135–145)

## 2015-10-11 LAB — GLUCOSE, CAPILLARY
GLUCOSE-CAPILLARY: 190 mg/dL — AB (ref 65–99)
GLUCOSE-CAPILLARY: 238 mg/dL — AB (ref 65–99)
Glucose-Capillary: 206 mg/dL — ABNORMAL HIGH (ref 65–99)
Glucose-Capillary: 238 mg/dL — ABNORMAL HIGH (ref 65–99)
Glucose-Capillary: 193 mg/dL — ABNORMAL HIGH (ref 65–99)

## 2015-10-11 LAB — MAGNESIUM: MAGNESIUM: 2.4 mg/dL (ref 1.7–2.4)

## 2015-10-11 LAB — CARBOXYHEMOGLOBIN
CARBOXYHEMOGLOBIN: 1.6 % — AB (ref 0.5–1.5)
Methemoglobin: 0.9 % (ref 0.0–1.5)
O2 SAT: 70 %
TOTAL HEMOGLOBIN: 8.4 g/dL — AB (ref 13.5–18.0)

## 2015-10-11 LAB — TRIGLYCERIDES: TRIGLYCERIDES: 80 mg/dL (ref <150)

## 2015-10-11 MED ORDER — DEXMEDETOMIDINE HCL IN NACL 200 MCG/50ML IV SOLN
0.4000 ug/kg/h | INTRAVENOUS | Status: DC
  Administered 2015-10-12: 0.4 ug/kg/h via INTRAVENOUS
  Administered 2015-10-12: 0.9 ug/kg/h via INTRAVENOUS
  Filled 2015-10-11 (×2): qty 50

## 2015-10-11 MED ORDER — SPIRONOLACTONE 25 MG PO TABS
12.5000 mg | ORAL_TABLET | Freq: Every day | ORAL | Status: DC
  Administered 2015-10-11 – 2015-10-15 (×4): 12.5 mg via ORAL
  Filled 2015-10-11 (×4): qty 1

## 2015-10-11 MED ORDER — ALBUMIN HUMAN 25 % IV SOLN
12.5000 g | Freq: Four times a day (QID) | INTRAVENOUS | Status: AC
  Administered 2015-10-11 – 2015-10-12 (×4): 12.5 g via INTRAVENOUS
  Filled 2015-10-11 (×4): qty 50

## 2015-10-11 MED ORDER — MIDAZOLAM HCL 2 MG/2ML IJ SOLN
1.0000 mg | INTRAMUSCULAR | Status: DC | PRN
Start: 2015-10-11 — End: 2015-10-14
  Administered 2015-10-11 – 2015-10-12 (×3): 2 mg via INTRAVENOUS
  Administered 2015-10-13: 1 mg via INTRAVENOUS
  Administered 2015-10-14: 2 mg via INTRAVENOUS
  Filled 2015-10-11 (×5): qty 2

## 2015-10-11 MED ORDER — FUROSEMIDE 10 MG/ML IJ SOLN
40.0000 mg | Freq: Four times a day (QID) | INTRAMUSCULAR | Status: AC
  Administered 2015-10-11 – 2015-10-12 (×3): 40 mg via INTRAVENOUS
  Filled 2015-10-11 (×3): qty 4

## 2015-10-11 MED ORDER — FREE WATER
250.0000 mL | Freq: Four times a day (QID) | Status: DC
  Administered 2015-10-11 – 2015-10-12 (×4): 250 mL

## 2015-10-11 MED ORDER — METOLAZONE 5 MG PO TABS
5.0000 mg | ORAL_TABLET | Freq: Every day | ORAL | Status: AC
  Administered 2015-10-11: 5 mg via ORAL
  Filled 2015-10-11: qty 1

## 2015-10-11 MED FILL — Nitroglycerin IV Soln 100 MCG/ML in D5W: INTRA_ARTERIAL | Qty: 10 | Status: AC

## 2015-10-11 NOTE — Progress Notes (Signed)
Inpatient Diabetes Program Recommendations  AACE/ADA: New Consensus Statement on Inpatient Glycemic Control (2015)  Target Ranges:  Prepandial:   less than 140 mg/dL      Peak postprandial:   less than 180 mg/dL (1-2 hours)      Critically ill patients:  140 - 180 mg/dL   Review of Glycemic Control  Results for MITCHEL, BLACHE (MRN [id number]) as of 10/11/2015 10:08  Ref. Range 10/10/2015 16:05 10/10/2015 19:54 10/10/2015 23:13 10/11/2015 03:44 10/11/2015 08:23  Glucose-Capillary Latest Ref Range: 65-99 mg/dL 191 (H) 660 (H) 600 (H) 206 (H) 190 (H)  Results for JAASIEL, HURLBUTT (MRN [id number]) as of 10/11/2015 10:08  Ref. Range 10/05/2015 15:22  Hemoglobin A1C Latest Ref Range: 4.8-5.6 % 6.9 (H)    Inpatient Diabetes Program Recommendations:   Insulin - Basal: Please consider increasing Lantus to 13 units QHS (based on 67 kg x 0.2 units). Insulin - Meal Coverage: Please consider ordering Novolog 3 units Q4H for tube feeding coverage.    Continue to follow. Thank you. Ailene Ards, RD, LDN, CDE Inpatient Diabetes Coordinator [phone]

## 2015-10-11 NOTE — Progress Notes (Signed)
Patient Name:  Eddie [name], DOB: February 25, 1951, MRN: [id number] Primary Doctor: No primary care provider on file. Primary Cardiologist:   Date: 10/11/2015   SUBJECTIVE:  Patient is intubated and sedated.   Past Medical History  Diagnosis Date  . PUD (peptic ulcer disease)    Filed Vitals:   10/11/15 0500 10/11/15 0600 10/11/15 0613 10/11/15 0700  BP: 141/74 160/101 129/71 132/73  Pulse: 90 93 90 92  Temp:      TempSrc:      Resp: Height:      Weight:      SpO2: 100% 100% 100% 100%    Intake/Output Summary (Last 24 hours) at 10/11/15 0728 Last data filed at 10/11/15 0600  Gross per 24 hour  Intake 3322.4 ml  Output   2925 ml  Net  397.4 ml   Filed Weights   10/09/15 0400 10/10/15 0600 10/11/15 0344  Weight: 152 lb 1.9 oz (69 kg) 147 lb 4.3 oz (66.8 kg) 144 lb 10 oz (65.6 kg)     LABS: Basic Metabolic Panel:  Recent Labs  78/29/56 0550 10/11/15 0340  NA 146* 145  K 4.4 4.1  CL 109 105  CO2 30 31  GLUCOSE 249* 226*  BUN 37* 39*  CREATININE 1.18 1.18  CALCIUM 8.0* 8.2*  MG 2.3 2.4  PHOS 3.2 4.2   Liver Function Tests:  Recent Labs  10/10/15 0550 10/11/15 0340  ALBUMIN 1.8* 2.0*   No results for input(s): LIPASE, AMYLASE in the last 72 hours. CBC:  Recent Labs  10/10/15 0550 10/11/15 0340  WBC 14.0* 14.7*  NEUTROABS 11.2* 11.6*  HGB 7.3* 7.2*  HCT 24.5* 24.3*  MCV 79.8 79.9  PLT 475* 537*   Cardiac Enzymes: No results for input(s): CKTOTAL, CKMB, CKMBINDEX, TROPONINI in the last 72 hours. BNP: Invalid input(s): POCBNP D-Dimer: No results for input(s): DDIMER in the last 72 hours. Thyroid Function Tests: No results for input(s): TSH, T4TOTAL, T3FREE, THYROIDAB in the last 72 hours.  Invalid input(s): FREET3  RADIOLOGY: Ct Angio Head W/cm &/or Wo Cm  10/05/2015  CLINICAL DATA:  Acute posterior right frontal lobe infarct on MRI. EXAM: CT ANGIOGRAPHY HEAD AND NECK TECHNIQUE: Multidetector CT imaging of the head and  neck was performed using the standard protocol during bolus administration of intravenous contrast. Multiplanar CT image reconstructions and MIPs were obtained to evaluate the vascular anatomy. Carotid stenosis measurements (when applicable) are obtained utilizing NASCET criteria, using the distal internal carotid diameter as the denominator. CONTRAST:  50mL OMNIPAQUE IOHEXOL 350 MG/ML SOLN COMPARISON:  Brain MRI and CT 10/04/2015. No prior angiographic imaging. FINDINGS: CT HEAD Brain: Small focus of hypoattenuation in the posterior right frontal lobe white matter corresponds to the acute infarct demonstrated on yesterday's MRI. There is no evidence of acute intracranial hemorrhage, mass, midline shift, or extra-axial fluid collection. Mild white matter hypoattenuation adjacent to the right temporal horn as on MRI. Ventricles and sulci are within normal limits for age. Calvarium and skull base: No destructive osseous lesion. Paranasal sinuses: Mild mucosal thickening, most notable in the left sphenoid sinus. Orbits: Postoperative changes to the right globe. CTA NECK Aortic arch: 3 vessel aortic arch with mild atherosclerotic calcification. Brachiocephalic and subclavian arteries are widely patent with mild non stenotic plaque. Right carotid system: Patent with mild mixed calcified and noncalcified plaque at the carotid bifurcation and in the proximal ICA. No significant stenosis. Left carotid system: Patent with minimal atherosclerotic luminal irregularity.  No stenosis. Vertebral arteries:Patent and codominant without stenosis. Skeleton: Mild cervical spondylosis. Other neck: Poor dentition with multiple periapical lucencies and missing teeth. Endotracheal and enteric tubes with small volume pharyngeal fluid. Extensive consolidative opacities partially visualized in the lung apices. Pleural effusions partially visualized. Left jugular venous catheter terminating in the proximal SVC. CTA HEAD Anterior circulation:  Internal carotid arteries are patent from skullbase to carotid termini. There is mild bilateral carotid siphon atherosclerosis without significant stenosis. ACAs and MCAs are patent without evidence of major branch occlusion or significant proximal stenosis. There is an early MCA bifurcation on the right. No intracranial aneurysm is identified. Posterior circulation: Intracranial vertebral arteries are widely patent to the vertebrobasilar junction. PICA, AICA, and SCA origins are patent. Basilar artery is patent without stenosis. Posterior communicating arteries are not clearly identified. PCAs are patent without evidence of significant stenosis. Venous sinuses: Patent. Anatomic variants: None. Delayed phase: No abnormal enhancement. IMPRESSION: 1. Very mild intracranial and extracranial atherosclerosis without evidence of major vessel occlusion or significant stenosis. 2. Partially visualized bilateral pleural effusions and extensive bilateral lung consolidation which may represent ARDS. Electronically Signed   By: Sebastian Ache M.D.   On: 10/05/2015 13:40   Ct Head Wo Contrast  10/04/2015  CLINICAL DATA:  New left-sided weakness. EXAM: CT HEAD WITHOUT CONTRAST TECHNIQUE: Contiguous axial images were obtained from the base of the skull through the vertex without intravenous contrast. COMPARISON:  10/01/2015 FINDINGS: Skull and Sinuses:No acute osseous finding. Nasopharyngeal fluid in the setting of intubation. Sphenoid sinusitis. Visualized orbits: Right cataract resection and probable glaucoma drainage device. Brain: No evidence of cortical infarction, hemorrhage, hydrocephalus, or mass lesion/mass effect. Small areas of centrum semiovale low-density bilaterally is likely small vessel ischemic change but the finding is better/newly seen on the right. IMPRESSION: 1. No hemorrhage or cortical infarct. 2. Small area of right frontal white matter low density would usually reflect chronic microvascular disease but is  newly seen, raising the possibility of interval white matter infarct. Electronically Signed   By: Marnee Spring M.D.   On: 10/04/2015 03:33   Ct Head Wo Contrast  10/01/2015  CLINICAL DATA:  Altered mental status, intubated EXAM: CT HEAD WITHOUT CONTRAST TECHNIQUE: Contiguous axial images were obtained from the base of the skull through the vertex without intravenous contrast. COMPARISON:  None. FINDINGS: Brain: No intracranial hemorrhage, mass effect or midline shift. There is no ventriculomegaly. No acute cortical infarction. No mass lesion is noted on this unenhanced scan. The gray and white-matter differentiation is preserved. No intra or extra-axial fluid collection. Vascular: No hyperdense vessel or unexpected calcification. Skull: Negative for fracture or focal lesion. Sinuses/Orbits: Mild mucosal thickening bilateral posterior aspect of maxillary sinus. Mild mucosal thickening bilateral ethmoid air cells. There is mucosal thickening with partial opacification bilateral sphenoid sinuses. Other: Mastoid air cells are unremarkable. Secretions are noted in nasopharynx. IMPRESSION: 1. No acute intracranial abnormality. No definite acute cortical infarction. 2. Paranasal sinuses disease as described above. Secretions are noted in nasopharynx. Electronically Signed   By: Natasha Mead M.D.   On: 10/01/2015 12:55   Ct Angio Neck W/cm &/or Wo/cm  10/05/2015  CLINICAL DATA:  Acute posterior right frontal lobe infarct on MRI. EXAM: CT ANGIOGRAPHY HEAD AND NECK TECHNIQUE: Multidetector CT imaging of the head and neck was performed using the standard protocol during bolus administration of intravenous contrast. Multiplanar CT image reconstructions and MIPs were obtained to evaluate the vascular anatomy. Carotid stenosis measurements (when applicable) are obtained utilizing NASCET criteria, using  the distal internal carotid diameter as the denominator. CONTRAST:  50mL OMNIPAQUE IOHEXOL 350 MG/ML SOLN COMPARISON:   Brain MRI and CT 10/04/2015. No prior angiographic imaging. FINDINGS: CT HEAD Brain: Small focus of hypoattenuation in the posterior right frontal lobe white matter corresponds to the acute infarct demonstrated on yesterday's MRI. There is no evidence of acute intracranial hemorrhage, mass, midline shift, or extra-axial fluid collection. Mild white matter hypoattenuation adjacent to the right temporal horn as on MRI. Ventricles and sulci are within normal limits for age. Calvarium and skull base: No destructive osseous lesion. Paranasal sinuses: Mild mucosal thickening, most notable in the left sphenoid sinus. Orbits: Postoperative changes to the right globe. CTA NECK Aortic arch: 3 vessel aortic arch with mild atherosclerotic calcification. Brachiocephalic and subclavian arteries are widely patent with mild non stenotic plaque. Right carotid system: Patent with mild mixed calcified and noncalcified plaque at the carotid bifurcation and in the proximal ICA. No significant stenosis. Left carotid system: Patent with minimal atherosclerotic luminal irregularity. No stenosis. Vertebral arteries:Patent and codominant without stenosis. Skeleton: Mild cervical spondylosis. Other neck: Poor dentition with multiple periapical lucencies and missing teeth. Endotracheal and enteric tubes with small volume pharyngeal fluid. Extensive consolidative opacities partially visualized in the lung apices. Pleural effusions partially visualized. Left jugular venous catheter terminating in the proximal SVC. CTA HEAD Anterior circulation: Internal carotid arteries are patent from skullbase to carotid termini. There is mild bilateral carotid siphon atherosclerosis without significant stenosis. ACAs and MCAs are patent without evidence of major branch occlusion or significant proximal stenosis. There is an early MCA bifurcation on the right. No intracranial aneurysm is identified. Posterior circulation: Intracranial vertebral arteries are  widely patent to the vertebrobasilar junction. PICA, AICA, and SCA origins are patent. Basilar artery is patent without stenosis. Posterior communicating arteries are not clearly identified. PCAs are patent without evidence of significant stenosis. Venous sinuses: Patent. Anatomic variants: None. Delayed phase: No abnormal enhancement. IMPRESSION: 1. Very mild intracranial and extracranial atherosclerosis without evidence of major vessel occlusion or significant stenosis. 2. Partially visualized bilateral pleural effusions and extensive bilateral lung consolidation which may represent ARDS. Electronically Signed   By: Sebastian Ache M.D.   On: 10/05/2015 13:40   Mr Brain Wo Contrast  10/05/2015  CLINICAL DATA:  Cardiac arrest, respiratory failure. Ventilated patient, not moving LEFT arm. Evaluate hemiplegia. EXAM: MRI HEAD WITHOUT CONTRAST TECHNIQUE: Multiplanar, multiecho pulse sequences of the brain and surrounding structures were obtained without intravenous contrast. COMPARISON:  CT head October 04, 2015 0304 hours FINDINGS: Focal 13 x 9 mm area of reduced diffusion LEFT posterior frontal deep white matter, with corresponding low ADC value. No susceptibility artifact to suggest hemorrhage. Intermediate FLAIR T2 hyperintense signal along RIGHT temporal horn. A few scattered supratentorial subcentimeter white matter FLAIR T2 hyperintensities. Tiny old cerebellar infarcts. Small LEFT caudate head lacunar infarct. No abnormal extra-axial fluid collections. Normal major intracranial vascular flow voids present at skull base. Status post RIGHT ocular lens implant in mild susceptibility artifact, likely representing glaucoma drainage implant. No abnormal sellar expansion. No cerebellar tonsillar ectopia. No suspicious calvarial bone marrow signal. Life-support lines in place. Mild paranasal sinus mucosal thickening with bilateral mastoid effusions. IMPRESSION: Acute small RIGHT for frontal lobe white matter infarct.  Mild white matter changes compatible with chronic small vessel ischemic disease. 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 Abnormal RIGHT temporal periventricular signal favoring old ischemia and gliosis though somewhat atypical in location. Recommend follow-up MRI of the brain with contrast on  a nonemergent basis. Electronically Signed   By: Awilda Metro M.D.   On: 10/05/2015 00:54   Dg Chest Port 1 View  10/11/2015  CLINICAL DATA:  Acute respiratory failure EXAM: PORTABLE CHEST 1 VIEW COMPARISON:  10/09/2015 FINDINGS: Endotracheal tube 7 cm above the carina unchanged. Recommend advancing 3 cm. Central venous catheter tip in the SVC unchanged. NG tube enters the stomach. Lungs remain clear without infiltrate effusion or edema. No effusion IMPRESSION: Endotracheal tube remains high in to be advanced 3 cm. This is unchanged Lungs remain clear Electronically Signed   By: Marlan Palau M.D.   On: 10/11/2015 07:14   Dg Chest Port 1 View  10/09/2015  CLINICAL DATA:  Intubated EXAM: PORTABLE CHEST 1 VIEW COMPARISON:  Chest radiograph from one day prior. FINDINGS: Endotracheal tube tip is 7 cm above the carina at the thoracic inlet. Enteric tube enters the stomach with the tip not seen on this image. Left internal jugular central venous catheter terminates in the upper third of the superior vena cava. Stable cardiomediastinal silhouette with normal heart size. No pneumothorax. No pleural effusion. No overt pulmonary edema. No consolidative airspace disease. IMPRESSION: 1. Endotracheal tube tip is 7 cm above the carina at the thoracic inlet, consider advancing 2-3 cm . 2. No overt pulmonary edema.  No consolidative airspace disease. Electronically Signed   By: Delbert Phenix M.D.   On: 10/09/2015 09:36   Dg Chest Port 1 View  10/08/2015  CLINICAL DATA:  Respiratory failure EXAM: PORTABLE CHEST 1 VIEW COMPARISON:  Yesterday FINDINGS: Endotracheal tube, NG tube, left jugular venous catheter are  stable. Pulmonary edema has improved. No pneumothorax. No pleural effusion. IMPRESSION: Improved pulmonary edema. Electronically Signed   By: Jolaine Click M.D.   On: 10/08/2015 08:12   Dg Chest Port 1 View  10/07/2015  CLINICAL DATA:  Respiratory failure. EXAM: PORTABLE CHEST 1 VIEW COMPARISON:  October 06, 2015. FINDINGS: Stable cardiomediastinal silhouette. Endotracheal and nasogastric tubes are unchanged in position. Left internal jugular catheter is unchanged in position. No pneumothorax or significant pleural effusion is noted. Bilateral upper lobe opacities are improved compared to prior exam. Stable bibasilar opacities are noted consistent with edema or pneumonia. IMPRESSION: Stable support apparatus. Improved bilateral upper lobe opacities are noted, with stable bibasilar opacities, concerning for edema or pneumonia. Electronically Signed   By: Lupita Raider, M.D.   On: 10/07/2015 07:20   Dg Chest Port 1 View  10/06/2015  CLINICAL DATA:  Hypoxia EXAM: PORTABLE CHEST 1 VIEW COMPARISON:  October 05, 2015 FINDINGS: Endotracheal tube tip is 3.6 cm above the carina. Central catheter tip is in the superior vena cava. Nasogastric tube tip and side port below the diaphragm. No pneumothorax. There is extensive interstitial and alveolar edema bilaterally, stable. Heart is mildly enlarged with pulmonary venous hypertension. No new opacity. No adenopathy evident. IMPRESSION: Tube and catheter positions as described without pneumothorax. Widespread interstitial and alveolar edema, stable. No new opacity. The appearance is consistent with congestive heart failure, although superimposed pneumonia cannot be excluded radiographically. Both entities may exist concurrently. No change in cardiac silhouette. Electronically Signed   By: Bretta Bang III M.D.   On: 10/06/2015 07:15   Dg Chest Port 1 View  10/05/2015  CLINICAL DATA:  Respiratory failure, smoker EXAM: PORTABLE CHEST 1 VIEW COMPARISON:  Portable exam 0522  hours compared to 10/03/2015 FINDINGS: Tip of endotracheal tube projects 3.7 cm above carina. Nasogastric tube extends into stomach. LEFT jugular central venous catheter tip projects  over SVC. Enlargement of cardiac silhouette. Diffuse BILATERAL pulmonary infiltrates increased since previous exam likely pulmonary edema though infection is not excluded. No pleural effusion or pneumothorax. Bones unremarkable. IMPRESSION: Satisfactory line and tube positions. Diffuse BILATERAL pulmonary infiltrates significantly increased since previous exam question pulmonary edema though multifocal pneumonia not excluded. Electronically Signed   By: Ulyses Southward M.D.   On: 10/05/2015 08:12   Dg Chest Port 1 View  10/03/2015  CLINICAL DATA:  Respiratory failure EXAM: PORTABLE CHEST 1 VIEW COMPARISON:  10/01/2015 FINDINGS: Endotracheal tube terminates 4.5 cm above the carina. Cardiomegaly with pulmonary vascular congestion. No frank interstitial edema. No pleural effusion or pneumothorax. Enteric tube courses into the mid stomach. IMPRESSION: Endotracheal tube terminates 4.5 cm above the carina. Cardiomegaly with pulmonary vascular congestion. No frank interstitial edema. Electronically Signed   By: Charline Bills M.D.   On: 10/03/2015 09:23   Dg Chest Port 1 View  10/01/2015  CLINICAL DATA:  Acute respiratory failure EXAM: PORTABLE CHEST 1 VIEW COMPARISON:  09/30/2015 FINDINGS: Stable endotracheal tube, NG tube, and central line. Mild cardiac enlargement. Vascular pattern normal. Mild residual interstitial prominence and hazy infrahilar opacity on the right. IMPRESSION: Further improvement in findings of pulmonary edema. Electronically Signed   By: Esperanza Heir M.D.   On: 10/01/2015 07:16   Dg Chest Port 1 View  09/30/2015  CLINICAL DATA:  Acute respiratory failure, shortness of breath, cardiac arrest, STEMI EXAM: PORTABLE CHEST 1 VIEW COMPARISON:  Portable chest x-ray of September 28, 2015 FINDINGS: The lungs are  well-expanded. There has been overall improvement in the pulmonary interstitial and alveolar opacities. The pulmonary vascularity remains somewhat indistinct. There remain coarse lung markings in the right infrahilar region. There is no large pleural effusion and no pneumothorax. The heart is normal in size. The endotracheal tube tip lies 5 cm above the carina. The esophagogastric tube tip projects below the inferior margin of the image. The left internal jugular venous catheter tip projects over the midportion of the SVC. IMPRESSION: Improving pulmonary interstitial and alveolar edema. No evidence of pneumothorax or large pleural effusion. Subsegmental atelectasis in the right lower lobe posterior medially. The support tubes are in reasonable position. Electronically Signed   By: David  [place] M.D.   On: 09/30/2015 07:23   Dg Chest Port 1 View  09/28/2015  CLINICAL DATA:  Central line placement. Endotracheal tube. Post arrest. EXAM: PORTABLE CHEST 1 VIEW COMPARISON:  09/28/2015 FINDINGS: Endotracheal tube with tip measuring 5.7 cm above the carina. Enteric tube tip is off the field of view but below the left hemidiaphragm. New left central venous catheter with tip over the low SVC region. No pneumothorax. Normal heart size. Persistent diffuse bilateral parenchymal infiltrates in the lungs without significant change since previous study. No blunting of costophrenic angles. IMPRESSION: Appliances appear in satisfactory position. No pneumothorax. Persistent diffuse bilateral pulmonary infiltrates. Electronically Signed   By: Burman Nieves M.D.   On: 09/28/2015 06:47   Dg Chest Port 1 View  09/28/2015  CLINICAL DATA:  Post CPR.  Code STEMI. EXAM: PORTABLE CHEST 1 VIEW COMPARISON:  None. FINDINGS: Endotracheal tube with tip measuring 4.2 cm above the carina. Enteric tube tip is off the field of view but below the left hemidiaphragm. Borderline heart size with normal pulmonary vascularity. Bilateral diffuse  parenchymal infiltrates centered in the perihilar regions. This could represent fluid overload, pneumonia, or aspiration. Visualized bones appear intact. IMPRESSION: Appliances appear in satisfactory location. Diffuse bilateral parenchymal infiltrates could indicate edema, pneumonia, or  aspiration. Electronically Signed   By: Burman Nieves M.D.   On: 09/28/2015 04:39    PHYSICAL EXAM   patient is intubated and sedated. Lungs reveal scattered rhonchi. There is no peripheral edema.   TELEMETRY:  I have reviewed telemetry today October 11, 2015. There is sinus rhythm.   ASSESSMENT AND PLAN:  64 yo with PEA arrest => hypothermia protocol and anterior STEMI s/p DES to LAD. He was also found by MRI to have right frontal CVA.  1. CAD: Initial anterior STEMI with DES to LAD on 3/7. On 3/17, noted to have worsening anterior STE and taken for relook cath (?re-occlusion of LAD stent). This showed patent stent and stable disease. 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  - Continue ASA 81, Plavix, statin.  On October 11, 2015, cardiac status is stable.  2. Acute systolic CHF: Most recent echo with EF 30-35%.  Blood pressure stable. Tolerating Coreg to 6.25 mg bid. Continue current lisinopril. Creatinine remained stable. The plan was to start low-dose spironolactone today and this will be done, October 11, 2015 Continue dosing Lasix 80 mg IV once daily to keep I/Os relatively matched.  - If he has neurologic recovery, will need Lifevest and repeat echo at 3 months to determine ICD.      Overall volume status is stable, October 11, 2015  3. CVA: Right frontal. Possibly cardioembolic though LV thrombus not visualized. Plan has been to stop ASA at 1 month and start warfarin for 1 year (in addition to Plavix).   4. Neuro:  He is sedated at this time. ?Anoxic encephalopathy.   5. Pulmonary: Suspect HCAP with MSSA in sputum. On cefazolin.         6. AKI:  Creatinine better today. Continue to follow.       Renal function stable October 11, 2015 7. Cardiac arrest: In setting of STEMI.  8. Anemia: No overt bleeding but hemoglobin low. Transfuse hgb < 7.     Cardiac arrest [facility])   ST elevation myocardial infarction (STEMI) (HCC)   Encephalopathy acute   Acute hypoxemic respiratory failure (HCC)   Hemiplegia (HCC)   Stroke (HCC)   ARDS (adult respiratory distress syndrome) (HCC)     Eddie [name] 10/11/2015 7:28 AM

## 2015-10-11 NOTE — Progress Notes (Signed)
eLink Physician-Brief Progress Note Patient Name: Eddie [name] DOB: 09/03/1950 MRN: [id number]   Date of Service  10/11/2015  HPI/Events of Note  Contacted by nursing regarding ongoing issues with agitation.Review of note reveals concern for delirium. Propofol discontinued today. Patient remains on high dose fentanyl infusion. Received 1 dose of when necessary Versed this evening  Along with a bolus dose of fentanyl. Can recheck on patient reveals an laying in the bed post bolus relatively comfortable and synchronous on the ventilator.  eICU Interventions  1. Continue fentanyl for pain relief 2. Initiate Precedex infusion for treatment of delirium     Intervention Category Major Interventions: Delirium, psychosis, severe agitation - evaluation and management  Eddie [name] 10/11/2015, 11:59 PM

## 2015-10-11 NOTE — Progress Notes (Signed)
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  Increased secretion.  VITAL SIGNS: BP 84/58 mmHg  Pulse 79  Temp(Src) 98.2 F (36.8 C) (Oral)  Resp 28  Ht 5\' 7"  (1.702 m)  Wt 65.6 kg (144 lb 10 oz)  BMI 22.65 kg/m2  SpO2 91%  VENTILATOR SETTINGS: Vent Mode:  [-] PRVC FiO2 (%):  [30 %-40 %] 40 % Set Rate:  [28 bmp] 28 bmp Vt Set:  [400 mL] 400 mL PEEP:  [5 cmH20] 5 cmH20 Pressure Support:  [5 cmH20] 5 cmH20 Plateau Pressure:  [17 cmH20-20 cmH20] 19 cmH20  INTAKE / OUTPUT: I/O last 3 completed shifts: In: 4941 [I.V.:1541; NG/GT:3100; IV Piggyback:300] Out: 3725 [Urine:3475; Emesis/NG output:250]  PHYSICAL EXAMINATION: General:  Eyes open. No acute distress.  Integument:  Warm & dry. No rash on exposed skin.  HEENT:  Moist mucus membranes. Poor dentition. Endotracheal tube in place. No scleral icterus. Cardiovascular:  Regular rate. Normal S1 & S2. No appreciable  JVD.  Pulmonary:  Clear to auscultation bilaterally. Symmetric chest wall rise on ventilator. Abdomen: Soft. Normal bowel sounds. Hypoactive bowel sounds. Neurological: Doesn't attend to voice or not to questions. Patient doesn't follow commands. Eyes are open.  LABS:  PULMONARY  Recent Labs Lab 10/06/15 1004 10/06/15 1055 10/10/15 0400 10/11/15 0330  PHART 7.458* 7.421  --   --   PCO2ART 41.5 47.1*  --   --   PO2ART 107.0* 108.0*  --   --   HCO3 29.5* 30.7*  --   --   TCO2 31 32  --   --   O2SAT 98.0 98.0 65.6 70.0   CBC  Recent Labs Lab 10/09/15 0400 10/10/15 0550 10/11/15 0340  HGB 7.6* 7.3* 7.2*  HCT 24.3* 24.5* 24.3*  WBC 14.4* 14.0* 14.7*  PLT 447* 475* 537*   COAGULATION No results for input(s): INR in the last 168 hours.  CARDIAC  No results for input(s): TROPONINI in the last 168 hours. No results for input(s): PROBNP in the last 168 hours.  CHEMISTRY  Recent Labs Lab 10/08/15 0438 10/08/15 1500 10/09/15 0400 10/10/15 0550 10/11/15 0340  NA 147* 146* 149* 146* 145  K 3.3* 3.2* 3.6 4.4 4.1  CL 99* 100* 101 109 105  CO2 34* 35* 34* 30 31  GLUCOSE 188* 199* 212* 249* 226*  BUN 30* 35* 43* 37* 39*  CREATININE 1.42* 1.53* 1.65* 1.18 1.18  CALCIUM 8.4* 8.1* 8.4* 8.0* 8.2*  MG  --   --  2.4 2.3 2.4  PHOS  --   --  2.3* 3.2 4.2   Estimated Creatinine Clearance: 58.7 mL/min (by C-G formula based on Cr of 1.18).  LIVER  Recent Labs Lab 10/05/15 0415 10/10/15 0550 10/11/15 0340  AST 16  --   --   ALT 10*  --   --   ALKPHOS 72  --   --   BILITOT 1.1  --   --   PROT 5.8*  --   --   ALBUMIN 1.9* 1.8* 2.0*   INFECTIOUS No results for input(s): LATICACIDVEN, PROCALCITON in the last 168 hours.  ENDOCRINE CBG (last 3)   Recent Labs  10/10/15 2313 10/11/15 0344 10/11/15 0823  GLUCAP 211* 206* 190*   IMAGING x48h  - image(s) personally visualized  -   highlighted in bold Dg Chest Port 1 View  10/11/2015  CLINICAL DATA:  Acute  respiratory failure EXAM: PORTABLE CHEST 1 VIEW COMPARISON:  10/09/2015 FINDINGS: Endotracheal tube 7 cm above the carina unchanged. Recommend advancing 3 cm. Central venous catheter tip in the SVC unchanged. NG tube enters the stomach. Lungs remain clear without infiltrate effusion or edema. No effusion IMPRESSION: Endotracheal tube remains high in to be advanced 3 cm. This is unchanged Lungs remain clear Electronically Signed   By: Marlan Palau M.D.   On: 10/11/2015 07:14   ASSESSMENT / PLAN:  CARDIOVASCULAR A:  STEMI. Acute Systolic CHF Shock - Secondary to sedation. Resolved.  P:  Management per primary serivce Lasix IV 40 mg IV q6 x3 doses. Albumin x4 doses. Zaroxolyn 5 mg PO x1. ASA  daily Coreg 3.125mg  bid Lisinopril 2.5mg  daily Brilinta bid Lipitor qhs  PULMONARY A: Acute Hypoxic Respiratory Failure HCAP ARDS  P:   Full Vent Support WUA & SBT as mental status allows Diuresis per primary service  RENAL A:   Hypernatremia - Worsening. Acute on Chronic Renal Failure - Worsening. Hypokalemia - Resolved.  P:   Monitoring UOP with foley Trending renal function daily along w/ electrolytes Free Water increase to 250 q6. Replace electrolytes as indicated Lasix IV 40 mg IV q6 x3 doses. Albumin x4 doses. Zaroxolyn 5 mg PO x1.  GASTROINTESTINAL A:   Constipation - No BM. H/O PUD  P:   Continuing tube feedings Protonix VT daily Senna bid VT Dulcolax Supp x1  HEMATOLOGIC A:   Anemia - No signs of active bleeding. Likely due to chronic illness. Leukocytosis - Mild. Possibly due to hemoconcentration.   P:  Trending cell counts daily w/ CBC SQ heparin q8hr SCDs Transfuse for Hb < 7  INFECTIOUS A:   HCAP - MSSA.  P:   Day #8 of antibiotics Changed to Ancef 3/16 Following cultures to completion Plan to re-culture for a fever  ENDOCRINE A:   Hyperglycemia - No H/O DM.     P:   SSI per algorithm Accu-Checks q4hr Lantus 10u  qhs  NEUROLOGIC A:   Acute Encephalopathy - Post arrest. Question element of delirium. Acute CVA 3/13  P:   RASS goal:  0 to -1 D/C Propofol gtt and start versed pushes. Fentanyl gtt  Family Discussion:  No family at bedside currently.  The patient is critically ill with multiple organ systems failure and requires high complexity decision making for assessment and support, frequent evaluation and titration of therapies, application of advanced monitoring technologies and extensive interpretation of multiple databases.   Critical Care  Time devoted to patient care services described in this note is  35  Minutes. This time reflects time of care of this signee Dr Jennet Maduro. This critical care time does not reflect procedure time, or teaching time or supervisory time of PA/NP/Med student/Med Resident etc but could involve care discussion time.  Rush Farmer, M.D. [facility] Pulmonary/Critical Care Medicine. Pager: [phone]. After hours pager: [phone].

## 2015-10-12 ENCOUNTER — Inpatient Hospital Stay (HOSPITAL_COMMUNITY): Payer: BLUE CROSS/BLUE SHIELD

## 2015-10-12 LAB — BASIC METABOLIC PANEL
ANION GAP: 16 — AB (ref 5–15)
BUN: 42 mg/dL — ABNORMAL HIGH (ref 6–20)
CO2: 32 mmol/L (ref 22–32)
Calcium: 9 mg/dL (ref 8.9–10.3)
Chloride: 97 mmol/L — ABNORMAL LOW (ref 101–111)
Creatinine, Ser: 1.34 mg/dL — ABNORMAL HIGH (ref 0.61–1.24)
GFR, EST NON AFRICAN AMERICAN: 54 mL/min — AB (ref >60)
Glucose, Bld: 286 mg/dL — ABNORMAL HIGH (ref 65–99)
POTASSIUM: 3.7 mmol/L (ref 3.5–5.1)
SODIUM: 145 mmol/L (ref 135–145)

## 2015-10-12 LAB — CBC WITH DIFFERENTIAL/PLATELET
BASOS PCT: 0 %
Basophils Absolute: 0 10*3/uL (ref 0.0–0.1)
EOS ABS: 0.2 10*3/uL (ref 0.0–0.7)
Eosinophils Relative: 1 %
HCT: 26.8 % — ABNORMAL LOW (ref 39.0–52.0)
HEMOGLOBIN: 8 g/dL — AB (ref 13.0–17.0)
LYMPHS ABS: 1.5 10*3/uL (ref 0.7–4.0)
Lymphocytes Relative: 10 %
MCH: 23.9 pg — ABNORMAL LOW (ref 26.0–34.0)
MCHC: 29.9 g/dL — ABNORMAL LOW (ref 30.0–36.0)
MCV: 80 fL (ref 78.0–100.0)
Monocytes Absolute: 0.6 10*3/uL (ref 0.1–1.0)
Monocytes Relative: 4 %
NEUTROS ABS: 13.9 10*3/uL — AB (ref 1.7–7.7)
NEUTROS PCT: 85 %
Platelets: 603 10*3/uL — ABNORMAL HIGH (ref 150–400)
RBC: 3.35 MIL/uL — AB (ref 4.22–5.81)
RDW: 14 % (ref 11.5–15.5)
WBC: 16.2 10*3/uL — AB (ref 4.0–10.5)

## 2015-10-12 LAB — GLUCOSE, CAPILLARY
GLUCOSE-CAPILLARY: 270 mg/dL — AB (ref 65–99)
GLUCOSE-CAPILLARY: 258 mg/dL — AB (ref 65–99)
GLUCOSE-CAPILLARY: 238 mg/dL — AB (ref 65–99)
Glucose-Capillary: 292 mg/dL — ABNORMAL HIGH (ref 65–99)
Glucose-Capillary: 292 mg/dL — ABNORMAL HIGH (ref 65–99)

## 2015-10-12 LAB — BLOOD GAS, ARTERIAL
Acid-Base Excess: 9.2 mmol/L — ABNORMAL HIGH (ref 0.0–2.0)
BICARBONATE: 32.9 meq/L — AB (ref 20.0–24.0)
Drawn by: 44956
FIO2: 0.4
LHR: 28 {breaths}/min
O2 Saturation: 98.8 %
PEEP: 5 cmH2O
PO2 ART: 124 mmHg — AB (ref 80.0–100.0)
Patient temperature: 98.6
TCO2: 34.1 mmol/L (ref 0–100)
VT: 0.4 mL
pCO2 arterial: 42.1 mmHg (ref 35.0–45.0)
pH, Arterial: 7.504 — ABNORMAL HIGH (ref 7.350–7.450)

## 2015-10-12 LAB — CARBOXYHEMOGLOBIN
CARBOXYHEMOGLOBIN: 1.9 % — AB (ref 0.5–1.5)
METHEMOGLOBIN: 0.6 % (ref 0.0–1.5)
O2 Saturation: 72.2 %
TOTAL HEMOGLOBIN: 8.4 g/dL — AB (ref 13.5–18.0)

## 2015-10-12 LAB — TRIGLYCERIDES: Triglycerides: 89 mg/dL (ref <150)

## 2015-10-12 LAB — PHOSPHORUS: PHOSPHORUS: 3.7 mg/dL (ref 2.5–4.6)

## 2015-10-12 LAB — MAGNESIUM: MAGNESIUM: 2.5 mg/dL — AB (ref 1.7–2.4)

## 2015-10-12 LAB — ALBUMIN: Albumin: 2.8 g/dL — ABNORMAL LOW (ref 3.5–5.0)

## 2015-10-12 MED ORDER — INSULIN ASPART 100 UNIT/ML ~~LOC~~ SOLN
0.0000 [IU] | SUBCUTANEOUS | Status: DC
  Administered 2015-10-12 (×2): 7 [IU] via SUBCUTANEOUS
  Administered 2015-10-13 (×4): 4 [IU] via SUBCUTANEOUS
  Administered 2015-10-14 (×2): 3 [IU] via SUBCUTANEOUS
  Administered 2015-10-14 (×2): 4 [IU] via SUBCUTANEOUS
  Administered 2015-10-15: 3 [IU] via SUBCUTANEOUS
  Administered 2015-10-15: 11 [IU] via SUBCUTANEOUS
  Administered 2015-10-15: 4 [IU] via SUBCUTANEOUS

## 2015-10-12 MED ORDER — INSULIN ASPART 100 UNIT/ML ~~LOC~~ SOLN: 2.0000 [IU] | SUBCUTANEOUS | Status: DC

## 2015-10-12 NOTE — Progress Notes (Signed)
Subjective:  Denies chest pain or shortness of breath.  Objective:  Vital Signs in the last 24 hours: Temp:  [97.3 F (36.3 C)-99.1 F (37.3 C)] 98.2 F (36.8 C) (03/21 1115) Pulse Rate:  [76-108] 86 (03/21 1300) Resp:  [17-31] 22 (03/21 1300) BP: (64-183)/(52-102) 171/85 mmHg (03/21 1300) SpO2:  [95 %-100 %] 100 % (03/21 1300) FiO2 (%):  [40 %] 40 % (03/21 1100) Weight:  [142 lb 13.7 oz (64.8 kg)] 142 lb 13.7 oz (64.8 kg) (03/21 0500)  Intake/Output from previous day: 03/20 0701 - 03/21 0700 In: 3498.3 [I.V.:978.3; NG/GT:2170; IV Piggyback:350] Out: 3915 [Urine:3855; Emesis/NG output:60]  Physical Exam: Pt is awake and alert, very hoarse after recent extubation, answers questions appropriately HEENT: normal Neck: JVP - normal Lungs: Coarse bilaterally CV: RRR with 2/6 systolic murmur best heard at the left upper sternal border Abd: soft, NT, Positive BS, no hepatomegaly Ext: no C/C/E, distal pulses intact and equal Skin: warm/dry no rash   Lab Results:  Recent Labs  10/11/15 0340 10/12/15 0345  WBC 14.7* 16.2*  HGB 7.2* 8.0*  PLT 537* 603*    Recent Labs  10/11/15 0340 10/12/15 0345  NA 145 145  K 4.1 3.7  CL 105 97*  CO2 31 32  GLUCOSE 226* 286*  BUN 39* 42*  CREATININE 1.18 1.34*   No results for input(s): TROPONINI in the last 72 hours.  Invalid input(s): CK, MB  Cardiac Studies: 2D Echo: Study Conclusions  - Left ventricle: The cavity size was mildly dilated. There was  mild concentric hypertrophy. Systolic function was moderately to  severely reduced. The estimated ejection fraction was in the  range of 30% to 35%. Doppler parameters are consistent with  abnormal left ventricular relaxation (grade 1 diastolic  dysfunction). - Regional wall motion abnormality: Hypokinesis of the mid  anteroseptal, basal-mid anterolateral, apical septal, apical  lateral, and apical myocardium. - Aortic valve: Transvalvular velocity was within  the normal range.  There was no stenosis. There was no regurgitation. - Mitral valve: Transvalvular velocity was within the normal range.  There was no evidence for stenosis. There was trivial  regurgitation. - Right ventricle: The cavity size was normal. Wall thickness was  normal. Systolic function was normal. - Atrial septum: No defect or patent foramen ovale was identified  by color flow Doppler. - Tricuspid valve: There was trivial regurgitation.  Tele: Personally reviewed: Sinus rhythm without arrhythmia  Assessment/Plan:  1. Anterior STEMI: Status post primary PCI. Continue aspirin, brilinta, and high-dose atorvastatin.  2. Cardiac arrest: Patient extubated today. Progressing well.  3. Acute systolic heart failure: LVEF less than 35%. Appears stable on carvedilol and lisinopril. Will continue with addition of spironolactone noted. Will follow renal function closely.  4. 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 Patient will be continued on aspirin and brilinta for now. Plans noted to start warfarin in one month in addition to Plavix. He will need to be transitioned from brilinta to Plavix prior to initiation of warfarin.  5. Acute kidney injury: Creatinine is stable  6. Acute respiratory failure: Per CCM. Suspected pneumonia on antibiotics.  7. Anemia: Multifactorial, no active bleeding  Complex patient, no family at bedside, multiple notes and studies as well as lab and radiographic data reviewed. LifeVest has been recommended, but I'm not sure this patient will be a candidate depending on the degree of neurocognitive dysfunction following his cardiac arrest and stroke. Will continue to follow his progress before making a decision on this.  Tonny Bollman, M.D.  10/12/2015, 1:13 PM

## 2015-10-12 NOTE — Progress Notes (Signed)
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  VITAL SIGNS: BP 132/79 mmHg  Pulse 87  Temp(Src) 97.8 F (36.6 C) (Oral)  Resp 20  Ht 5\' 7"  (1.702 m)  Wt 64.8 kg (142 lb 13.7 oz)  BMI 22.37 kg/m2  SpO2 100%  VENTILATOR SETTINGS: Vent Mode:  [-] PSV FiO2 (%):  [40 %] 40 % Set Rate:  [20 bmp-28 bmp] 20 bmp Vt Set:  [400 mL] 400 mL PEEP:  [5 cmH20] 5 cmH20 Pressure Support:  [5 cmH20] 5 cmH20 Plateau Pressure:  [17 cmH20-23 cmH20] 18 cmH20  INTAKE / OUTPUT: I/O last 3 completed shifts: In: 5282.7 [I.V.:1542.7; NG/GT:3290; IV Piggyback:450] Out: 4540 [Urine:4480; Emesis/NG output:60]  PHYSICAL EXAMINATION: General:  Eyes open. No acute distress.  Integument:  Warm & dry. No rash on exposed skin.  HEENT:  Moist mucus membranes. Poor dentition. Endotracheal tube in place. No scleral icterus. Cardiovascular:  Regular rate. Normal S1 & S2. No appreciable JVD.   Pulmonary:  Clear to auscultation bilaterally. Symmetric chest wall rise on ventilator. Abdomen: Soft. Normal bowel sounds. Hypoactive bowel sounds. Neurological: Doesn't attend to voice or not to questions. Patient doesn't follow commands. Eyes are open.  LABS:  PULMONARY  Recent Labs Lab 10/06/15 1004 10/06/15 1055 10/10/15 0400 10/11/15 0330 10/12/15 0340 10/12/15 0356  PHART 7.458* 7.421  --   --  7.504*  --   PCO2ART 41.5 47.1*  --   --  42.1  --   PO2ART 107.0* 108.0*  --   --  124*  --   HCO3 29.5* 30.7*  --   --  32.9*  --   TCO2 31 32  --   --  34.1  --   O2SAT 98.0 98.0 65.6 70.0 98.8 72.2   CBC  Recent Labs Lab 10/10/15 0550 10/11/15 0340 10/12/15 0345  HGB 7.3* 7.2* 8.0*  HCT 24.5* 24.3* 26.8*  WBC 14.0* 14.7* 16.2*  PLT 475* 537* 603*   COAGULATION No results for input(s): INR in the last 168 hours.  CARDIAC  No results for input(s): TROPONINI in the last 168 hours. No results for input(s): PROBNP in the last 168 hours.  CHEMISTRY  Recent Labs Lab 10/08/15 1500 10/09/15 0400 10/10/15 0550 10/11/15 0340 10/12/15 0345  NA 146* 149* 146* 145 145  K 3.2* 3.6 4.4 4.1 3.7  CL 100* 101 109 105 97*  CO2 35* 34* 30 31  32  GLUCOSE 199* 212* 249* 226* 286*  BUN 35* 43* 37* 39* 42*  CREATININE 1.53* 1.65* 1.18 1.18 1.34*  CALCIUM 8.1* 8.4* 8.0* 8.2* 9.0  MG  --  2.4 2.3 2.4 2.5*  PHOS  --  2.3* 3.2 4.2 3.7   Estimated Creatinine Clearance: 51 mL/min (by C-G formula based on Cr of 1.34).  LIVER  Recent Labs Lab 10/10/15 0550 10/11/15 0340 10/12/15 0345  ALBUMIN 1.8* 2.0* 2.8*   INFECTIOUS No results for input(s): LATICACIDVEN, PROCALCITON in the last 168 hours.  ENDOCRINE CBG (last 3)   Recent Labs  10/12/15 0036 10/12/15 0346 10/12/15 0823  GLUCAP 292* 270* 258*   IMAGING x48h  - image(s) personally visualized  -   highlighted in bold Dg Chest Port 1 View  10/12/2015  CLINICAL DATA:  Shortness of breath, cardiac arrest, acute  respiratory failure with intubation. EXAM: PORTABLE CHEST 1 VIEW COMPARISON:  Portable chest x-ray of October 11, 2015 FINDINGS: The lungs are well-expanded. The interstitial markings remain increased but have improved somewhat since yesterday's study. The cardiac silhouette is top-normal in size. The central pulmonary vascularity is prominent. Cephalization has decreased. The mediastinum is normal in width. There is mild tortuosity of the descending thoracic aorta. The endotracheal tube tip lies approximately 7 cm above the carina. The esophagogastric tube tip projects below the inferior margin of the image. The left internal jugular venous catheter tip projects at the junction of the right and left brachiocephalic veins. IMPRESSION: Slight further interval improvement in the pulmonary interstitium consistent with resolving CHF. There is no alveolar pneumonia. The support tubes are in stable position. The endotracheal tube should be advanced approximately 3 cm as recommended on yesterday's study. Electronically Signed   By: David  [place] M.D.   On: 10/12/2015 07:27   Dg Chest Port 1 View  10/11/2015  CLINICAL DATA:  Acute respiratory failure EXAM: PORTABLE CHEST 1 VIEW COMPARISON:  10/09/2015 FINDINGS: Endotracheal tube 7 cm above the carina unchanged. Recommend advancing 3 cm. Central venous catheter tip in the SVC unchanged. NG tube enters the stomach. Lungs remain clear without infiltrate effusion or edema. No effusion IMPRESSION: Endotracheal tube remains high in to be advanced 3 cm. This is unchanged Lungs remain clear Electronically Signed   By: Marlan Palau M.D.   On: 10/11/2015 07:14   ASSESSMENT / PLAN:  CARDIOVASCULAR A:  STEMI. Acute Systolic CHF Shock - Secondary to sedation. Resolved.  P:  Management per primary serivce Hold further lasix at this point. D/C Albumin. ASA 81mg  daily Coreg 3.125mg  bid Lisinopril 2.5mg  daily Brilinta bid Lipitor qhs  PULMONARY A: Acute Hypoxic  Respiratory Failure HCAP ARDS  P:   Extubate IS Flutter valve Ambulate SLP  RENAL A:   Hypernatremia - Worsening. Acute on Chronic Renal Failure - Worsening. Hypokalemia - Resolved.  P:   Monitoring UOP with foley Trending renal function daily along w/ electrolytes D/C Free Water. Replace electrolytes as indicated D/C further diureses.  GASTROINTESTINAL A:   Constipation - No BM. H/O PUD  P:   D/C TF SLP Protonix VT daily  HEMATOLOGIC A:   Anemia - No signs of active bleeding. Likely due to chronic illness. Leukocytosis - Mild. Possibly due to hemoconcentration.   P:  Trending cell counts daily w/ CBC SQ heparin q8hr SCDs Transfuse for Hb < 7  INFECTIOUS A:   HCAP - MSSA.  P:   Day #9 of antibiotics, will finish a 10 day course (DC after tomorrow's dose). Changed  to Ancef 3/16 Following cultures to completion Plan to re-culture for a fever  ENDOCRINE A:   Hyperglycemia - No H/O DM.     P:   SSI per algorithm, change ISS to standard from sensitive.  Accu-Checks q4hr Lantus 10u qhs  NEUROLOGIC A:   Acute Encephalopathy - Post arrest. Question element of delirium. Acute CVA 3/13  P:   RASS goal:  0 to -1 D/C all sedation.  Family Discussion:  No family at bedside currently.  The patient is critically ill with multiple organ systems failure and requires high complexity decision making for assessment and support, frequent evaluation and titration of therapies, application of advanced monitoring technologies and extensive interpretation of multiple databases.   Critical Care Time devoted to patient care services described in this note is  35  Minutes. This time reflects time of care of this signee Dr Koren Bound. This critical care time does not reflect procedure time, or teaching time or supervisory time of PA/NP/Med student/Med Resident etc but could involve care discussion time.  Alyson Reedy, M.D. [facility] Pulmonary/Critical Care  Medicine. Pager: [phone]. After hours pager: [phone].

## 2015-10-12 NOTE — Progress Notes (Signed)
eLink Physician-Brief Progress Note Patient Name: Ty Gurule DOB: [birth year] MRN: [id number]   Date of Service  10/12/2015  HPI/Events of Note  Brief Camara check on patient. Patient appearing to be more alert with eyes open. Attempt indicate comfortable shifting in bed. ABG reviewed showsalkalosis with PCO2 42. Patient overbreathing the ventilator at approximately 30 breaths per minute.  eICU Interventions  1. Continue Precedex infusion 2. Decrease set ventilator rate to 20 breaths per minute     Intervention Category Major Interventions: Acid-Base disturbance - evaluation and management;Respiratory failure - evaluation and management  Lawanda Cousins 10/12/2015, 4:16 AM

## 2015-10-12 NOTE — Procedures (Signed)
Extubation Procedure Note  Patient Details:   Name: Eddie [name] DOB: March 01, 1951 MRN: [id number]   Pt extubated to 4L [place] per MD order. Pt tolerating well at this time, able to vocalize, no stridor noted. RT will continue to monitor.    Evaluation  O2 sats: stable throughout Complications: No apparent complications Patient did tolerate procedure well. Bilateral Breath Sounds: Rhonchi Suctioning: Airway Yes  Harley Hallmark 10/12/2015, 11:29 AM

## 2015-10-12 NOTE — Progress Notes (Signed)
Called E-Link RN Baxter Hire) and CCM MD regarding possible stridor and patient's c/o difficulty breathing.  Patient assymptomatic of respiratory distress.  RR 18 with Sats 100%.  Patient coughed with sip of water.  P.M. meds held until seen by Speech Therapy for swallow evaluation.  Will continue to monitor for changes.

## 2015-10-12 NOTE — Progress Notes (Signed)
Nutrition Follow-up  DOCUMENTATION CODES:   Not applicable  INTERVENTION:   -Continue Vital AF 1.2 via OGT @ 60 ml/hr  -Continue 250 ml free water flush QID, (1000 ml daily)  TF regimen provides 1728 kcal, 108 grams of protein, and 1167 ml of free water.  Total free water 2167 ml  NUTRITION DIAGNOSIS:   Inadequate oral intake related to inability to eat as evidenced by NPO status.  Ongoing  GOAL:   Patient will meet greater than or equal to 90% of their needs  Met with TF  MONITOR:   Diet advancement, Vent status, Labs, I & O's  REASON FOR ASSESSMENT:   Ventilator    ASSESSMENT:   The patient presented with chest pain. He is intubated and unresponsive. The history comes from EMS staff and his wife. He apparently started to have chest pain about one hour ago. Intial EKG showed sinus tachycardia with ST elevation in V2 through V4. EMS was called. He became unresponsive shortly before arriving in the ED.  Pt underwent MRI of brain on 10/05/15, which revealed an acute rt frontal infarct.   Pt very agitated; per MD notes, concern for delirium. Pt is currently on Precedex infusion.   Patient remains intubated on ventilator support. OGT in place.  MV: 15.8 L/min Temp (24hrs), Avg:98.2 F (36.8 C), Min:97.3 F (36.3 C), Max:99.1 F (37.3 C)  Propofol: d/c 10/11/15  Vital AF 1.2 currently infusing via OGT at goal rate of 60 ml/hr, which provides 1728 kcal, 108 grams of protein, and 1167 ml of free water (2167 ml total free water, with inclusion of 250 ml free water flush every 6 hours). Regimen provides 93% of estimated kcal needs and 100% of estimated protein needs.  Case discussed with RN. Pt continues to tolerate TF well. Per MD notes, pt may require trach/PEG.   Labs reviewed: Mg: 2.5   Diet Order:  Diet NPO time specified  Skin:  Reviewed, no issues  Last BM:  10/09/15  Height:   Ht Readings from Last 1 Encounters:  09/28/15 _0  (1.702 m)     Weight:   Wt Readings from Last 1 Encounters:  10/12/15 142 lb 13.7 oz (64.8 kg)    Ideal Body Weight:  67.27 kg  BMI:  Body mass index is 22.37 kg/(m^2).  Estimated Nutritional Needs:   Kcal:  1852.1  Protein:  95-110 grams  Fluid:  1.8 L  EDUCATION NEEDS:   No education needs identified at this time  Eddie [name] A. Jimmye Norman, RD, LDN, CDE Pager: [phone] After hours Pager: [phone]

## 2015-10-12 NOTE — Progress Notes (Signed)
IV Fentanyl gtt 125ccs wasted via sink with Horton Marshall

## 2015-10-12 NOTE — Progress Notes (Signed)
RN called due to stridor, cam check, mild tracheitis, no resp distress or stridor noted. Rec. Continue albuterol. Will continue to monitor.

## 2015-10-12 NOTE — Progress Notes (Signed)
Called E-link RN Lowella Bandy) and CCM regarding persistent coughing and vomiting. RT came to evaluate as well. Pt. RR 15 and sats still 100%. Pt. Remains NPO at this time until swallow evaluation conducted. Will continue to monitor closely.

## 2015-10-13 ENCOUNTER — Inpatient Hospital Stay (HOSPITAL_COMMUNITY): Payer: BLUE CROSS/BLUE SHIELD

## 2015-10-13 DIAGNOSIS — I5023 Acute on chronic systolic (congestive) heart failure: Secondary | ICD-10-CM | POA: Insufficient documentation

## 2015-10-13 DIAGNOSIS — R0682 Tachypnea, not elsewhere classified: Secondary | ICD-10-CM

## 2015-10-13 DIAGNOSIS — I639 Cerebral infarction, unspecified: Secondary | ICD-10-CM

## 2015-10-13 DIAGNOSIS — D649 Anemia, unspecified: Secondary | ICD-10-CM

## 2015-10-13 DIAGNOSIS — I69991 Dysphagia following unspecified cerebrovascular disease: Secondary | ICD-10-CM

## 2015-10-13 DIAGNOSIS — D72829 Elevated white blood cell count, unspecified: Secondary | ICD-10-CM

## 2015-10-13 DIAGNOSIS — E1140 Type 2 diabetes mellitus with diabetic neuropathy, unspecified: Secondary | ICD-10-CM | POA: Insufficient documentation

## 2015-10-13 DIAGNOSIS — E118 Type 2 diabetes mellitus with unspecified complications: Secondary | ICD-10-CM

## 2015-10-13 DIAGNOSIS — J189 Pneumonia, unspecified organism: Secondary | ICD-10-CM

## 2015-10-13 DIAGNOSIS — Z720 Tobacco use: Secondary | ICD-10-CM | POA: Insufficient documentation

## 2015-10-13 DIAGNOSIS — I2129 ST elevation (STEMI) myocardial infarction involving other sites: Secondary | ICD-10-CM

## 2015-10-13 DIAGNOSIS — I10 Essential (primary) hypertension: Secondary | ICD-10-CM | POA: Insufficient documentation

## 2015-10-13 LAB — GLUCOSE, CAPILLARY
GLUCOSE-CAPILLARY: 113 mg/dL — AB (ref 65–99)
GLUCOSE-CAPILLARY: 173 mg/dL — AB (ref 65–99)
Glucose-Capillary: 133 mg/dL — ABNORMAL HIGH (ref 65–99)
Glucose-Capillary: 190 mg/dL — ABNORMAL HIGH (ref 65–99)
Glucose-Capillary: 191 mg/dL — ABNORMAL HIGH (ref 65–99)
Glucose-Capillary: 190 mg/dL — ABNORMAL HIGH (ref 65–99)
Glucose-Capillary: 177 mg/dL — ABNORMAL HIGH (ref 65–99)

## 2015-10-13 LAB — CBC
HEMATOCRIT: 28.4 % — AB (ref 39.0–52.0)
Hemoglobin: 8.4 g/dL — ABNORMAL LOW (ref 13.0–17.0)
MCH: 24 pg — AB (ref 26.0–34.0)
MCHC: 29.6 g/dL — AB (ref 30.0–36.0)
MCV: 81.1 fL (ref 78.0–100.0)
Platelets: 712 10*3/uL — ABNORMAL HIGH (ref 150–400)
RBC: 3.5 MIL/uL — ABNORMAL LOW (ref 4.22–5.81)
RDW: 14.2 % (ref 11.5–15.5)
WBC: 19.8 10*3/uL — ABNORMAL HIGH (ref 4.0–10.5)

## 2015-10-13 LAB — RENAL FUNCTION PANEL
Albumin: 2.9 g/dL — ABNORMAL LOW (ref 3.5–5.0)
Anion gap: 14 (ref 5–15)
BUN: 40 mg/dL — AB (ref 6–20)
CHLORIDE: 99 mmol/L — AB (ref 101–111)
CO2: 32 mmol/L (ref 22–32)
Calcium: 9.1 mg/dL (ref 8.9–10.3)
Creatinine, Ser: 1.09 mg/dL (ref 0.61–1.24)
GFR calc Af Amer: >60 mL/min (ref >60)
GFR calc non Af Amer: >60 mL/min (ref >60)
GLUCOSE: 206 mg/dL — AB (ref 65–99)
POTASSIUM: 3.4 mmol/L — AB (ref 3.5–5.1)
Phosphorus: 3.2 mg/dL (ref 2.5–4.6)
Sodium: 145 mmol/L (ref 135–145)

## 2015-10-13 LAB — MAGNESIUM: Magnesium: 2.4 mg/dL (ref 1.7–2.4)

## 2015-10-13 MED ORDER — CETYLPYRIDINIUM CHLORIDE 0.05 % MT LIQD
7.0000 mL | Freq: Two times a day (BID) | OROMUCOSAL | Status: DC
  Administered 2015-10-14 – 2015-10-15 (×3): 7 mL via OROMUCOSAL

## 2015-10-13 MED ORDER — CEFAZOLIN SODIUM-DEXTROSE 2-4 GM/100ML-% IV SOLN
2.0000 g | Freq: Three times a day (TID) | INTRAVENOUS | Status: DC
  Administered 2015-10-13 – 2015-10-15 (×8): 2 g via INTRAVENOUS
  Filled 2015-10-13 (×13): qty 100

## 2015-10-13 MED ORDER — POTASSIUM CHLORIDE 20 MEQ/15ML (10%) PO SOLN
40.0000 meq | Freq: Three times a day (TID) | ORAL | Status: AC
  Administered 2015-10-13 (×2): 40 meq
  Filled 2015-10-13 (×2): qty 30

## 2015-10-13 MED ORDER — POTASSIUM CHLORIDE 10 MEQ/50ML IV SOLN
10.0000 meq | INTRAVENOUS | Status: AC
  Administered 2015-10-13 (×4): 10 meq via INTRAVENOUS
  Filled 2015-10-13 (×4): qty 50

## 2015-10-13 MED ORDER — CHLORHEXIDINE GLUCONATE 0.12 % MT SOLN
15.0000 mL | Freq: Two times a day (BID) | OROMUCOSAL | Status: DC
  Administered 2015-10-15: 15 mL via OROMUCOSAL
  Filled 2015-10-13: qty 15

## 2015-10-13 MED ORDER — CEFAZOLIN SODIUM 1-5 GM-% IV SOLN
1.0000 g | Freq: Three times a day (TID) | INTRAVENOUS | Status: DC
  Filled 2015-10-13 (×2): qty 50

## 2015-10-13 NOTE — Consult Note (Addendum)
Physical Medicine and Rehabilitation Consult   Reason for Consult: Left sided weakness, dysphagia and balance deficits Referring Physician: Dr. Allyson Sabal.    HPI: Eddie [name] is a 65 y.o. male who was admitted on 09/28/15 with chest pain with ST elevation due to anterior STEMI and developed unresponsiveness enroute to ED. He was intubated and treated with brief compressions and epi for PEA. He was taken to cath lab urgently and DES placed to LAD. 2D echo with EF 10-15%. He was treated with hypothermia protocol and during rewarming was noted to have agitation as well as left sided weakness on 03/13. 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 He developed copious oral secretions due to MSSA  HCAP and was started on Vanc/Fortaz for treatment. Repeat echo with EF 30-35% with diffuse hypokinesis and no PFO or thrombus.  EEG ordered due to fluctuating MS and showed generalized background slowing due to "pharmacologic, hypoxic, toxic-metabolic or other diffuse physiologic etiology". Treated with ARDS protocol and tolerated extubation on 03/21. Dr. Roda Shutters felt that stroke possibly cardio-embolic. Patient to continue ASA/Brilinta for now and start warfarin/plavix in about a month. Acute systolic failure treated with Ace, coreg and aldactone--question LiveVest depending on cognitive recovery per Dr. Shirlee Latch. Swallow evaluation done today and NPO recommended due to aphonia, stridorous inhalations and evidence of severe dysphagia. PT evaluation done today and patient with resultant Left sided weakness, poor posture and cognitive deficits. CIR recommended for follow up therapy.     Review of Systems  HENT: Negative for hearing loss.   Eyes: Positive for blurred vision (right eye for years?).  Respiratory: Positive for shortness of breath. Negative for cough.     Cardiovascular: Negative for chest pain, palpitations and leg swelling.  Gastrointestinal: Negative for heartburn, nausea and abdominal pain.  Genitourinary: Negative for dysuria and urgency.  Musculoskeletal: Negative for myalgias, back pain and joint pain.  Skin: Negative for rash.  Neurological: Positive for speech change and focal weakness. Negative for dizziness, tingling and headaches.  Psychiatric/Behavioral: Positive for memory loss.  All other systems reviewed and are negative.     Past Medical History  Diagnosis Date  . PUD (peptic ulcer disease)   Tobacco abuse  Past Surgical History  Procedure Laterality Date  . Repair of peptic ulcer    . Cardiac catheterization N/A 09/28/2015    Procedure: Left Heart Cath and Coronary Angiography;  Surgeon: Runell Gess, MD;  Location: [facility] INVASIVE CV LAB;  Service: Cardiovascular;  Laterality: N/A;  . Cardiac catheterization N/A 10/08/2015    Procedure: Left Heart Cath and Coronary Angiography;  Surgeon: Lyn Records, MD;  Location: [facility] INVASIVE CV LAB;  Service: Cardiovascular;  Laterality: N/A;    History reviewed. No pertinent family history.    Social History:  Married. 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 and she will not be able to provide 24/7 support at discharge He reports that he has been smoking about 2-3 PPD. Marland Kitchen  He does not have any smokeless tobacco history on file. He denies s alcohol or drug use.     Allergies: No Known Allergies    Medications Prior to Admission  Medication Sig Dispense Refill  . Famotidine (PEPCID AC PO) Take 1 tablet by mouth 2 (two) times daily.      Home: Home Living Family/patient expects to be  discharged to:: Private residence Living Arrangements: Spouse/significant other Available Help at Discharge: Family Type of Home: House Home Access: Stairs to enter Secretary/administrator of Steps: 1 Home Equipment: None  Functional History: Prior Function Level of Independence:  Independent Functional Status:  Mobility: Bed Mobility Overal bed mobility: Needs Assistance Bed Mobility: Supine to Sit Supine to sit: Max assist General bed mobility comments: patient was able to initate RLE to EOB, but unable to bring LLE to edge, required increased assist to bring LEs off bed and rotate hips to EOB. Increased assist to power trunk to upright position. Patient with poor ability to maintain trunk control during transition. Transfers Overall transfer level: Needs assistance Equipment used:  (2 person face to face with gait belt) Transfers: Sit to/from Stand, Stand Pivot Transfers Sit to Stand: +2 physical assistance, Mod assist Stand pivot transfers: +2 physical assistance, Mod assist General transfer comment: +2 assist for safety, patient able to initiate power up to standing with assist. LLE knee blocked to maintain extension. Patient able to take pivotal steps with RLE to chair. Assist to maintain stability and faciliatation of upright positioning      ADL:    Cognition: Cognition Overall Cognitive Status: Impaired/Different from baseline Orientation Level: Oriented to person, Disoriented to place, Disoriented to time, Disoriented to situation Cognition Arousal/Alertness: Awake/alert Behavior During Therapy: Flat affect Overall Cognitive Status: Impaired/Different from baseline Area of Impairment: Orientation, Attention, Memory, Following commands, Safety/judgement, Awareness, Problem solving Orientation Level: Disoriented to, Time, Situation Current Attention Level: Focused Memory: Decreased short-term memory Following Commands: Follows one step commands consistently Safety/Judgement: Decreased awareness of safety, Decreased awareness of deficits Awareness: Intellectual Problem Solving: Slow processing, Difficulty sequencing, Requires verbal cues, Requires tactile cues General Comments: Patient with decreased insight and awareness of deficits noted as  patient did not believe that he was falling off to the side despite several attempts to cue patient to his inability to sit upright at EOB.   Blood pressure 151/86, pulse 90, temperature 98.5 F (36.9 C), temperature source Oral, resp. rate 22, height 5\' 7"  (1.702 m), weight 61.6 kg (135 lb 12.9 oz), SpO2 100 %. Physical Exam  Nursing note and vitals reviewed. Constitutional: He appears well-developed and well-nourished.  HENT:  Head: Atraumatic.  Mouth/Throat: Oropharynx is clear and moist.  Poor dentition  Eyes: EOM are normal. Pupils are equal, round, and reactive to light.  Neck: Neck supple.  Cardiovascular: Normal rate and regular rhythm.   Respiratory: No stridor. No respiratory distress. He has rhonchi. He exhibits no tenderness.  Weak cough  GI: Soft. Bowel sounds are normal. He exhibits no distension. There is no tenderness.  + NG  Musculoskeletal: He exhibits no edema or tenderness.  Neurological: He is alert.  Oriented to self and place.  Could not recall details of admission or reason for left sided weakness.  He has very poor insight and awareness of deficits. ?expressive deficits with occasional perseverative output.  Able to follow simple one step motor commands with cues.  Dysphonia Sensation intact light touch DTRs symmetric Motor: Right upper extremity and right lower x-ray 5/5 proximal distal Left upper extremity/left lower extremity: 2+/5 proximal to distal  Skin: Skin is warm and dry.  Psychiatric: He has a normal mood and affect. His behavior is normal.    Results for orders placed or performed during the hospital encounter of 09/28/15 (from the past 24 hour(s))  Glucose, capillary     Status: Abnormal   Collection Time: 10/12/15  4:54 PM  Result Value Ref Range   Glucose-Capillary 238 (H) 65 - 99 mg/dL  Glucose, capillary     Status: Abnormal   Collection Time: 10/12/15  9:07 PM  Result Value Ref Range   Glucose-Capillary 133 (H) 65 - 99 mg/dL    Comment 1 Capillary Specimen    Comment 2 Notify RN   Glucose, capillary     Status: Abnormal   Collection Time: 10/13/15 12:06 AM  Result Value Ref Range   Glucose-Capillary 190 (H) 65 - 99 mg/dL   Comment 1 Capillary Specimen    Comment 2 Notify RN   Glucose, capillary     Status: Abnormal   Collection Time: 10/13/15  3:20 AM  Result Value Ref Range   Glucose-Capillary 191 (H) 65 - 99 mg/dL   Comment 1 Venous Specimen    Comment 2 Notify RN   Renal function panel     Status: Abnormal   Collection Time: 10/13/15  3:41 AM  Result Value Ref Range   Sodium 145 135 - 145 mmol/L   Potassium 3.4 (L) 3.5 - 5.1 mmol/L   Chloride 99 (L) 101 - 111 mmol/L   CO2 32 22 - 32 mmol/L   Glucose, Bld 206 (H) 65 - 99 mg/dL   BUN 40 (H) 6 - 20 mg/dL   Creatinine, Ser 1.61 0.61 - 1.24 mg/dL   Calcium 9.1 8.9 - 09.6 mg/dL   Phosphorus 3.2 2.5 - 4.6 mg/dL   Albumin 2.9 (L) 3.5 - 5.0 g/dL   GFR calc non Af Amer >60 >60 mL/min   GFR calc Af Amer >60 >60 mL/min   Anion gap 14 5 - 15  CBC     Status: Abnormal   Collection Time: 10/13/15  3:41 AM  Result Value Ref Range   WBC 19.8 (H) 4.0 - 10.5 K/uL   RBC 3.50 (L) 4.22 - 5.81 MIL/uL   Hemoglobin 8.4 (L) 13.0 - 17.0 g/dL   HCT 04.5 (L) 40.9 - 81.1 %   MCV 81.1 78.0 - 100.0 fL   MCH 24.0 (L) 26.0 - 34.0 pg   MCHC 29.6 (L) 30.0 - 36.0 g/dL   RDW 91.4 78.2 - 95.6 %   Platelets 712 (H) 150 - 400 K/uL  Magnesium     Status: None   Collection Time: 10/13/15  3:41 AM  Result Value Ref Range   Magnesium 2.4 1.7 - 2.4 mg/dL   Dg Chest Port 1 View  10/12/2015  CLINICAL DATA:  Shortness of breath, cardiac arrest, acute respiratory failure with intubation. EXAM: PORTABLE CHEST 1 VIEW COMPARISON:  Portable chest x-ray of October 11, 2015 FINDINGS: The lungs are well-expanded. The interstitial markings remain increased but have improved somewhat since yesterday's study. The cardiac silhouette is top-normal in size. The central pulmonary vascularity is  prominent. Cephalization has decreased. The mediastinum is normal in width. There is mild tortuosity of the descending thoracic aorta. The endotracheal tube tip lies approximately 7 cm above the carina. The esophagogastric tube tip projects below the inferior margin of the image. The left internal jugular venous catheter tip projects at the junction of the right and left brachiocephalic veins. IMPRESSION: Slight further interval improvement in the pulmonary interstitium consistent with resolving CHF. There is no alveolar pneumonia. The support tubes are in stable position. The endotracheal tube should be advanced approximately 3 cm as recommended on yesterday's study. Electronically Signed   By: David  [place] M.D.   On: 10/12/2015 07:27    Assessment/Plan: Diagnosis: right  frontal lobe infarct with old left caudate  Labs and images independently reviewed.  Records reviewed and summated above. Stroke: Continue secondary stroke prophylaxis and Risk Factor Modification listed below:   Antiplatelet therapy:   Blood Pressure Management:  Continue current medication with prn's with permisive HTN per primary team Statin Agent:   Diabetes management:   Tobacco abuse:  Continue to counsel Left sided hemiparesis: fit for orthotics to prevent contractures (resting hand splint for day, wrist cock up splint at night, PRAFO, etc) Motor recovery: Fluoxetine  1. Does the need for close, 24 hr/day medical supervision in concert with the patient's rehab needs make it unreasonable for this patient to be served in a less intensive setting? Yes  2. Co-Morbidities requiring supervision/potential complications: STEMI (Cont meds, monitor in accordance with increased physical activity and avoid UE resistance excercises), systolic CHF (Monitor in accordance with increased physical activity and avoid UE resistance excercises), HCAP (continue meds), severe dysphagia  (continue SLP, consider vital stim), tobacco abuse (Continue  to counsel), tachypnea (monitor RR and O2 Sats with increased physical exertion), HTN (monitor and provide prns in accordance with increased physical exertion and pain), DM (Monitor in accordance with exercise and adjust meds as necessary), hypokalemia (cont to monitor, replete as necessary), leukocytosis (cont to monitor for signs and symptoms of infection, further workup if indicated), ABLA (transfuse if necessary to ensure appropriate perfusion for increased activity tolerance), thrombocytosis (cont to monitor) 3. Due to bladder management, safety, skin/wound care, disease management and patient education, does the patient require 24 hr/day rehab nursing? Yes 4. Does the patient require coordinated care of a physician, rehab nurse, PT (1-2 hrs/day, 5 days/week), OT (1-2 hrs/day, 5 days/week) and SLP (1-2 hrs/day, 5 days/week) to address physical and functional deficits in the context of the above medical diagnosis(es)? Yes Addressing deficits in the following areas: balance, endurance, locomotion, strength, transferring, bowel/bladder control, bathing, dressing, feeding, grooming, toileting, cognition, speech, swallowing and psychosocial support 5. Can the patient actively participate in an intensive therapy program of at least 3 hrs of therapy per day at least 5 days per week? Yes 6. The potential for patient to make measurable gains while on inpatient rehab is excellent 7. Anticipated functional outcomes upon discharge from inpatient rehab are supervision and min assist  with PT, supervision and min assist with OT, modified independent and supervision with SLP. 8. Estimated rehab length of stay to reach the above functional goals is: 15-19 days. 9. Does the patient have adequate social supports and living environment to accommodate these discharge functional goals? No 10. Anticipated D/C setting: Other 11. Anticipated post D/C treatments: HH therapy and Home excercise program 12. Overall  Rehab/Functional Prognosis: good  RECOMMENDATIONS: This patient's condition is appropriate for continued rehabilitative care in the following setting: Willl need to clarify ability of caregiver support on discharge.  However, if pt does not have adequate support, he will likely require SNF.  Will continue to follow patient as he becomes more medically stable and appropriate for discharge. Patient has agreed to participate in recommended program. Yes Note that insurance prior authorization may be required for reimbursement for recommended care.  Comment: Rehab Admissions Coordinator to follow up.  Maryla Morrow, MD 10/13/2015

## 2015-10-13 NOTE — Evaluation (Signed)
Clinical/Bedside Swallow Evaluation Patient Details  Name: Eddie [name] MRN: [id number] Date of Birth: 1951-03-22  Today's Date: 10/13/2015 Time: SLP Start Time (ACUTE ONLY): 7408 SLP Stop Time (ACUTE ONLY): 0828 SLP Time Calculation (min) (ACUTE ONLY): 17 min  Past Medical History:  Past Medical History  Diagnosis Date  . PUD (peptic ulcer disease)    Past Surgical History:  Past Surgical History  Procedure Laterality Date  . Repair of peptic ulcer    . Cardiac catheterization N/A 09/28/2015    Procedure: Left Heart Cath and Coronary Angiography;  Surgeon: Runell Gess, MD;  Location: [facility] INVASIVE CV LAB;  Service: Cardiovascular;  Laterality: N/A;  . Cardiac catheterization N/A 10/08/2015    Procedure: Left Heart Cath and Coronary Angiography;  Surgeon: Lyn Records, MD;  Location: [facility] INVASIVE CV LAB;  Service: Cardiovascular;  Laterality: N/A;   HPI:  65 year old male admitted 3/7 chest pain, becoming unresponsive shortely before arrival to ED. Patient with cardiac arrest (brief chest compressions),  NSTEMI, intubated. Possible CVA symptoms noted on 3/13. 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 Extubated 3/20.    Assessment / Plan / Recommendation Clinical Impression  Bedside swallow evaluation complete. Patient presents with evidence of a severe pharyngeal dysphagia with suspected sensory and motor components, acute and reversible in nature due to prolonged intubation. Patient aphonic and with stridorous inhalations indicative of upper airway, glottic dysfunction and with overt indication of aspiration with limited po trials. Patient to remain NPO, including meds. Suspect that short term non-oral means will be beneficial at this time. SLP will f/u closely for diagnostic po trials, readiness to resume a po diet.     Aspiration Risk  Severe aspiration risk    Diet Recommendation NPO;Alternative means - temporary   Medication  Administration: Via alternative means    Other  Recommendations Oral Care Recommendations: Oral care QID   Follow up Recommendations   (TBD)    Frequency and Duration min 3x week  2 weeks       Prognosis Prognosis for Safe Diet Advancement: Good Barriers to Reach Goals: Severity of deficits      Swallow Study   General HPI: 65 year old male admitted 3/7 chest pain, becoming unresponsive shortely before arrival to ED. Patient with cardiac arrest (brief chest compressions),  NSTEMI, intubated. Possible CVA symptoms noted on 3/13. 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 Extubated 3/20.  Type of Study: Bedside Swallow Evaluation Previous Swallow Assessment: none Diet Prior to this Study: NPO Temperature Spikes Noted: No Respiratory Status: Room air History of Recent Intubation: Yes Length of Intubations (days): 13 days Date extubated: 10/12/15 Behavior/Cognition: Alert;Cooperative;Pleasant mood Oral Cavity Assessment: Within Functional Limits Oral Care Completed by SLP: Yes Oral Cavity - Dentition: Missing dentition;Poor condition Vision: Functional for self-feeding Self-Feeding Abilities: Able to feed self Patient Positioning: Upright in bed Baseline Vocal Quality: Aphonic Volitional Cough: Strong (stridorous inhalations) Volitional Swallow: Able to elicit    Oral/Motor/Sensory Function Overall Oral Motor/Sensory Function: Within functional limits   Ice Chips Ice chips: Impaired Presentation: Spoon Pharyngeal Phase Impairments: Multiple swallows;Wet Vocal Quality;Cough - Immediate   Thin Liquid Thin Liquid: Not tested    Nectar Thick Nectar Thick Liquid: Not tested   Honey Thick Honey Thick Liquid: Not tested   Puree Puree: Not tested   Solid   GO  Quantavious Eggert MA, CCC-SLP [phone]  Solid: Not tested  Devann Cribb Meryl 10/13/2015,8:37 AM

## 2015-10-13 NOTE — Progress Notes (Signed)
    Subjective:  No CP. Some dyspnea. Overall doing better.  Objective:  Vital Signs in the last 24 hours: Temp:  [98.4 F (36.9 C)-99.5 F (37.5 C)] 99.4 F (37.4 C) (03/22 1959) Pulse Rate:  [88-102] 91 (03/22 1959) Resp:  [10-27] 27 (03/22 1959) BP: (124-177)/(73-113) 175/101 mmHg (03/22 1959) SpO2:  [98 %-100 %] 100 % (03/22 1959) Weight:  [135 lb 12.9 oz (61.6 kg)] 135 lb 12.9 oz (61.6 kg) (03/22 0324)  Intake/Output from previous day: 03/21 0701 - 03/22 0700 In: 1016 [I.V.:321; NG/GT:395; IV Piggyback:300] Out: 1820 [Urine:1820]  Physical Exam: Pt is alert and oriented, NAD HEENT: normal, voice very hoarse, NGT in place Neck: JVP - normal Lungs: Coarse bilaterally CV: RRR without murmur or gallop Abd: soft, NT, Positive BS, no hepatomegaly Ext: no C/C/E, distal pulses intact and equal Skin: warm/dry no rash   Lab Results:  Recent Labs  10/12/15 0345 10/13/15 0341  WBC 16.2* 19.8*  HGB 8.0* 8.4*  PLT 603* 712*    Recent Labs  10/12/15 0345 10/13/15 0341  NA 145 145  K 3.7 3.4*  CL 97* 99*  CO2 32 32  GLUCOSE 286* 206*  BUN 42* 40*  CREATININE 1.34* 1.09   No results for input(s): TROPONINI in the last 72 hours.  Invalid input(s): CK, MB  Tele: Sinus rhythm  Assessment/Plan:  Anterior STEMI - s/p primary PCI: continue ASA and brilinta. Progressing slowly.  Cardiac arrest: in setting of stemi. Improving with stable hemodynamics and heart rhythm  Acute systolic heart failure, now compensated. Continue lisinopril and carvedilol and aldactone  Other problems stable. Pt up to chair this morning but hasn't walked yet. Continue to work with rehab team.   Tonny Bollman, M.D. 10/13/2015, 9:59 PM

## 2015-10-13 NOTE — Progress Notes (Signed)
PULMONARY / CRITICAL CARE MEDICINE   Name: Eddie [name] MRN: [id number] DOB: Nov 07, 1950    ADMISSION DATE:  09/28/2015 CONSULTATION DATE:  09/28/15  REFERRING MD:  Antoine Poche  CHIEF COMPLAINT:  Cardiac Arrest  STUDIES:  Cardiac Cath 03/07 > 100% LAD occlusion > DES placed Echo 3/07 >> EF 10 to 15%, grade 1 diastolic  CT head 3/10 >> no acute findings EEG 3/10 >> generalized slowing CT head 3/13 >> Rt frontal white matter low density MRI brain 3/14 >> acute small Rt frontal infarct, old Lt caudate and cerebellar infarcts   MICROBIOLOGY: Tracheal Asp Ctx 3/13:  MSSA Sputum Ctx 3/7:  Oral Flora MRSA PCR 3/7:  Negative   ANTIBIOTICS: Ancef 3/16>>> Vancomycin 3/13 - 3/16 Fortaz 3/13 - 3/16  LINES/TUBES: OETT 7.5 3/7>>> L IJ CVL 3/7>>> OGT 3/7>>> Foley 3/7>>> PIV x2 A line 03/07 - 3/11  SIGNIFICANT EVENTS: 03/07 PEA arrest > taken to cath lab then returned to ICU on vent  > hypothermia started  3/13 Possible CVA >> neuro consulted, increased respiratory secretions >> started ABx 3/15 ARDS protocol, pressors  SUBJECTIVE:  No events overnight.  Extubated and doing well but failed swallow evaluation.  VITAL SIGNS: BP 151/86 mmHg  Pulse 90  Temp(Src) 98.5 F (36.9 C) (Oral)  Resp 22  Ht  (1.702 m)  Wt 61.6 kg (135 lb 12.9 oz)  BMI 21.26 kg/m2  SpO2 100%  VENTILATOR SETTINGS:    INTAKE / OUTPUT: I/O last 3 completed shifts: In: 3084.1 [I.V.:849.1; NG/GT:1735; IV Piggyback:500] Out: 3600 [Urine:3600]  PHYSICAL EXAMINATION: General:  Eyes open. No acute distress.  Integument:  Warm & dry. No rash on exposed skin.  HEENT:  Moist mucus membranes. Poor dentition. Endotracheal tube in place. No scleral icterus. Cardiovascular:  Regular rate. Normal S1 & S2. No appreciable JVD.  Pulmonary:  Clear to auscultation bilaterally. Symmetric chest wall rise on ventilator. Abdomen: Soft. Normal bowel sounds. Hypoactive bowel sounds. Neurological: Doesn't attend to voice  or not to questions. Patient doesn't follow commands. Eyes are open.  LABS:  PULMONARY  Recent Labs Lab 10/10/15 0400 10/11/15 0330 10/12/15 0340 10/12/15 0356  PHART  --   --  7.504*  --   PCO2ART  --   --  42.1  --   PO2ART  --   --  124*  --   HCO3  --   --  32.9*  --   TCO2  --   --  34.1  --   O2SAT 65.6 70.0 98.8 72.2   CBC  Recent Labs Lab 10/11/15 0340 10/12/15 0345 10/13/15 0341  HGB 7.2* 8.0* 8.4*  HCT 24.3* 26.8* 28.4*  WBC 14.7* 16.2* 19.8*  PLT 537* 603* 712*   COAGULATION No results for input(s): INR in the last 168 hours.  CARDIAC  No results for input(s): TROPONINI in the last 168 hours. No results for input(s): PROBNP in the last 168 hours.  CHEMISTRY  Recent Labs Lab 10/09/15 0400 10/10/15 0550 10/11/15 0340 10/12/15 0345 10/13/15 0341  NA 149* 146* 145 145 145  K 3.6 4.4 4.1 3.7 3.4*  CL 101 109 105 97* 99*  CO2 34* 30 31 32 32  GLUCOSE 212* 249* 226* 286* 206*  BUN 43* 37* 39* 42* 40*  CREATININE 1.65* 1.18 1.18 1.34* 1.09  CALCIUM 8.4* 8.0* 8.2* 9.0 9.1  MG 2.4 2.3 2.4 2.5* 2.4  PHOS 2.3* 3.2 4.2 3.7 3.2   Estimated Creatinine Clearance: 59.7 mL/min (by C-G formula based on  Cr of 1.09).  LIVER  Recent Labs Lab 10/10/15 0550 10/11/15 0340 10/12/15 0345 10/13/15 0341  ALBUMIN 1.8* 2.0* 2.8* 2.9*   INFECTIOUS No results for input(s): LATICACIDVEN, PROCALCITON in the last 168 hours.  ENDOCRINE CBG (last 3)   Recent Labs  10/12/15 2107 10/13/15 0006 10/13/15 0320  GLUCAP 133* 190* 191*   IMAGING x48h  - image(s) personally visualized  -   highlighted in bold Dg Chest Port 1 View  10/12/2015  CLINICAL DATA:  Shortness of breath, cardiac arrest, acute respiratory failure with intubation. EXAM: PORTABLE CHEST 1 VIEW COMPARISON:  Portable chest x-ray of October 11, 2015 FINDINGS: The lungs are well-expanded. The interstitial markings remain increased but have improved somewhat since yesterday's study. The cardiac  silhouette is top-normal in size. The central pulmonary vascularity is prominent. Cephalization has decreased. The mediastinum is normal in width. There is mild tortuosity of the descending thoracic aorta. The endotracheal tube tip lies approximately 7 cm above the carina. The esophagogastric tube tip projects below the inferior margin of the image. The left internal jugular venous catheter tip projects at the junction of the right and left brachiocephalic veins. IMPRESSION: Slight further interval improvement in the pulmonary interstitium consistent with resolving CHF. There is no alveolar pneumonia. The support tubes are in stable position. The endotracheal tube should be advanced approximately 3 cm as recommended on yesterday's study. Electronically Signed   By: David  [place] M.D.   On: 10/12/2015 07:27   I reviewed CXR myself, no acute disease.  ASSESSMENT / PLAN:  CARDIOVASCULAR A:  STEMI. Acute Systolic CHF Shock - Secondary to sedation. Resolved.  P:  Management per cards. Hold further lasix at this point. D/C Albumin. ASA 81mg  daily Coreg 3.125mg  bid Lisinopril 2.5mg  daily Brilinta bid Lipitor qhs  PULMONARY A: Acute Hypoxic Respiratory Failure HCAP ARDS  P:   Titrate O2 for sat of 88-92%. IS Flutter valve Ambulate SLP  RENAL A:   Hypernatremia - Worsening. Acute on Chronic Renal Failure - Worsening. Hypokalemia - Resolved.  P:   Monitoring UOP with foley Trending renal function daily along w/ electrolytes Replace electrolytes as indicated D/C further diureses.  GASTROINTESTINAL A:   Constipation - No BM. H/O PUD  P:   Cortrack and restart TF. SLP failed. Protonix VT daily.  HEMATOLOGIC A:   Anemia - No signs of active bleeding. Likely due to chronic illness. Leukocytosis - Mild. Possibly due to hemoconcentration.   P:  Trending cell counts daily w/CBC SQ heparin q8hr SCDs Transfuse for Hb < 7  INFECTIOUS A:   HCAP - MSSA.  P:   Day #10  of antibiotics, will finish a 10 day course d/ced on 3/22. Changed to Ancef 3/16 Following cultures to completion Plan to re-culture for a fever  ENDOCRINE A:   Hyperglycemia - No H/O DM.     P:   SSI per algorithm, change ISS to standard from sensitive.  Accu-Checks q4hr Lantus 10u qhs  NEUROLOGIC A:   Acute Encephalopathy - Post arrest. Question element of delirium. Acute CVA 3/13  P:   D/C all sedation.  Family Discussion:  Patient updated bedside.  Discussed with bedside RN.  PCCM will sign off, please call back if needed.  Alyson Reedy, M.D. [facility] Pulmonary/Critical Care Medicine. Pager: [phone]. After hours pager: [phone].

## 2015-10-13 NOTE — Progress Notes (Signed)
eLink Physician-Brief Progress Note Patient Name: Eddie [name] DOB: Jan 30, 1951 MRN: [id number]   Date of Service  10/13/2015  HPI/Events of Note  K+ = 3.4 and Creatinine = 1.09.  eICU Interventions  Will replete K+.     Intervention Category Intermediate Interventions: Electrolyte abnormality - evaluation and management  Sommer,Steven Eugene 10/13/2015, 4:30 AM

## 2015-10-13 NOTE — Evaluation (Signed)
Physical Therapy Evaluation Patient Details Name: Eddie [name] MRN: [id number] DOB: [birth year] Today's Date: 10/13/2015   History of Present Illness  65 year old male admitted 3/7 chest pain, becoming unresponsive shortely before arrival to ED. Patient with cardiac arrest (brief chest compressions), NSTEMI, intubated. Possible CVA symptoms noted on 3/13. 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 Extubated 3/20  Clinical Impression  Patient demonstrates deficits in functional mobility as indicated below. Will need continued skilled PT to address deficits and maximize function. Will see as indicated and progress as tolerated.  At this time feel patient will need post acute rehabilitation, highly recommend CIR consult for comprehensive therapies.     Follow Up Recommendations CIR    Equipment Recommendations   (TBD)    Recommendations for Other Services Rehab consult     Precautions / Restrictions Precautions Precautions: Fall Restrictions Weight Bearing Restrictions: No      Mobility  Bed Mobility Overal bed mobility: Needs Assistance Bed Mobility: Supine to Sit     Supine to sit: Max assist     General bed mobility comments: patient was able to initate RLE to EOB, but unable to bring LLE to edge, required increased assist to bring LEs off bed and rotate hips to EOB. Increased assist to power trunk to upright position. Patient with poor ability to maintain trunk control during transition.  Transfers Overall transfer level: Needs assistance Equipment used:  (2 person face to face with gait belt) Transfers: Sit to/from [employer] Sit to Stand: +2 physical assistance;Mod assist Stand pivot transfers: +2 physical assistance;Mod assist       General transfer comment: +2 assist for safety, patient able to initiate power up to standing with assist. LLE knee blocked to maintain extension. Patient able to take  pivotal steps with RLE to chair. Assist to maintain stability and faciliatation of upright positioning  Ambulation/Gait                Stairs            Wheelchair Mobility    Modified Rankin (Stroke Patients Only)       Balance Overall balance assessment: Needs assistance Sitting-balance support: Bilateral upper extremity supported;Feet supported Sitting balance-Leahy Scale: Poor Sitting balance - Comments: Patient unable to maintain upright without hands on assist. patient with significant LLE and poor ability to engage trunk support Postural control: Left lateral lean Standing balance support: During functional activity Standing balance-Leahy Scale: Zero                               Pertinent Vitals/Pain Pain Assessment: No/denies pain    Home Living Family/patient expects to be discharged to:: Private residence Living Arrangements: Spouse/significant other Available Help at Discharge: Family Type of Home: House Home Access: Stairs to enter   Secretary/administrator of Steps: 1   Home Equipment: None      Prior Function Level of Independence: Independent               Hand Dominance   Dominant Hand: Right    Extremity/Trunk Assessment   Upper Extremity Assessment: Defer to OT evaluation           Lower Extremity Assessment: LLE deficits/detail   LLE Deficits / Details: LLE weakness noted, 2+/5 gross motions, unable to complete full range   Cervical / Trunk Assessment:  (poor truncal control, noted weakness )  Communication  Communication:  (low volume)  Cognition Arousal/Alertness: Awake/alert Behavior During Therapy: Flat affect Overall Cognitive Status: Impaired/Different from baseline Area of Impairment: Orientation;Attention;Memory;Following commands;Safety/judgement;Awareness;Problem solving Orientation Level: Disoriented to;Time;Situation Current Attention Level: Focused Memory: Decreased short-term  memory Following Commands: Follows one step commands consistently Safety/Judgement: Decreased awareness of safety;Decreased awareness of deficits Awareness: Intellectual Problem Solving: Slow processing;Difficulty sequencing;Requires verbal cues;Requires tactile cues General Comments: Patient with decreased insight and awareness of deficits noted as patient did not believe that he was falling off to the side despite several attempts to cue patient to his inability to sit upright at EOB.    General Comments General comments (skin integrity, edema, etc.): EOB trunck control activities performed, lateral lean on elbow, difficulty powering up, required physical assist to maintain midline    Exercises        Assessment/Plan    PT Assessment Patient needs continued PT services  PT Diagnosis Difficulty walking;Abnormality of gait;Generalized weakness;Acute pain;Hemiplegia non-dominant side   PT Problem List Decreased strength;Decreased activity tolerance;Decreased balance;Decreased mobility;Decreased coordination;Decreased cognition;Decreased safety awareness;Cardiopulmonary status limiting activity  PT Treatment Interventions DME instruction;Gait training;Stair training;Functional mobility training;Therapeutic activities;Balance training;Therapeutic exercise;Patient/family education   PT Goals (Current goals can be found in the Care Plan section) Acute Rehab PT Goals Patient Stated Goal: none stated PT Goal Formulation: With patient Time For Goal Achievement: 10/27/15 Potential to Achieve Goals: Good    Frequency Min 4X/week   Barriers to discharge        Co-evaluation               End of Session Equipment Utilized During Treatment: Gait belt Activity Tolerance: Patient tolerated treatment well Patient left: in chair;with call bell/phone within reach;with chair alarm set Nurse Communication: Mobility status         Time: 4098-1191 PT Time Calculation (min) (ACUTE  ONLY): 25 min   Charges:   PT Evaluation $PT Eval High Complexity: 1 Procedure PT Treatments $Therapeutic Activity: 8-22 mins   PT G CodesFabio Asa 2015-10-24, 11:03 AM Charlotte Crumb, PT DPT  [phone]

## 2015-10-13 NOTE — Progress Notes (Signed)
eLink Physician-Brief Progress Note Patient Name: Eddie [name] DOB: [birth year] MRN: [id number]   Date of Service  10/13/2015  HPI/Events of Note  Nurse reports patient accidentally pulled out his NG tube. Unable to take oral medication. Patient did have stent placed Post-STEMI and is currently on Dilantin.  eICU Interventions  1. Replacing NG tube 2. KUB post placement to confirm placement     Intervention Category Minor Interventions: Communication with other healthcare providers and/or family  Lawanda Cousins 10/13/2015, 9:35 PM

## 2015-10-14 ENCOUNTER — Inpatient Hospital Stay (HOSPITAL_COMMUNITY): Payer: BLUE CROSS/BLUE SHIELD

## 2015-10-14 LAB — CBC
HEMATOCRIT: 31.4 % — AB (ref 39.0–52.0)
HEMOGLOBIN: 9.4 g/dL — AB (ref 13.0–17.0)
MCH: 24.3 pg — AB (ref 26.0–34.0)
MCHC: 29.9 g/dL — ABNORMAL LOW (ref 30.0–36.0)
MCV: 81.1 fL (ref 78.0–100.0)
Platelets: 811 10*3/uL — ABNORMAL HIGH (ref 150–400)
RBC: 3.87 MIL/uL — AB (ref 4.22–5.81)
RDW: 14.9 % (ref 11.5–15.5)
WBC: 17.8 10*3/uL — AB (ref 4.0–10.5)

## 2015-10-14 LAB — GLUCOSE, CAPILLARY
GLUCOSE-CAPILLARY: 160 mg/dL — AB (ref 65–99)
GLUCOSE-CAPILLARY: 141 mg/dL — AB (ref 65–99)
GLUCOSE-CAPILLARY: 156 mg/dL — AB (ref 65–99)
GLUCOSE-CAPILLARY: 134 mg/dL — AB (ref 65–99)
Glucose-Capillary: 176 mg/dL — ABNORMAL HIGH (ref 65–99)
Glucose-Capillary: 88 mg/dL (ref 65–99)

## 2015-10-14 LAB — RENAL FUNCTION PANEL
ALBUMIN: 2.9 g/dL — AB (ref 3.5–5.0)
ANION GAP: 13 (ref 5–15)
BUN: 37 mg/dL — ABNORMAL HIGH (ref 6–20)
CO2: 27 mmol/L (ref 22–32)
Calcium: 9.2 mg/dL (ref 8.9–10.3)
Chloride: 105 mmol/L (ref 101–111)
Creatinine, Ser: 1.03 mg/dL (ref 0.61–1.24)
GFR calc Af Amer: >60 mL/min (ref >60)
GFR calc non Af Amer: >60 mL/min (ref >60)
GLUCOSE: 163 mg/dL — AB (ref 65–99)
PHOSPHORUS: 3.2 mg/dL (ref 2.5–4.6)
POTASSIUM: 3.8 mmol/L (ref 3.5–5.1)
Sodium: 145 mmol/L (ref 135–145)

## 2015-10-14 LAB — MAGNESIUM: Magnesium: 2.7 mg/dL — ABNORMAL HIGH (ref 1.7–2.4)

## 2015-10-14 MED ORDER — LORAZEPAM 1 MG PO TABS
1.0000 mg | ORAL_TABLET | ORAL | Status: DC | PRN
  Administered 2015-10-14 – 2015-10-15 (×2): 1 mg via ORAL
  Filled 2015-10-14 (×3): qty 1

## 2015-10-14 NOTE — Progress Notes (Signed)
Hospital Problem List     Active Problems:   Cardiac arrest (HCC)   ST elevation myocardial infarction (STEMI) (HCC)   STEMI (ST elevation myocardial infarction) (HCC)   Encounter for central line placement   Hypokalemia   Encephalopathy acute   Acute hypoxemic respiratory failure (HCC)   Acute respiratory failure (HCC)   Altered mental status   Hemiplegia (HCC)   Stroke (HCC)   ARDS (adult respiratory distress syndrome) (HCC)   Absolute anemia   Right-sided cerebrovascular accident (CVA) (HCC)   Acute on chronic systolic congestive heart failure (HCC)   HCAP (healthcare-associated pneumonia)   Dysphagia as late effect of cerebrovascular disease   Tobacco abuse   Tachypnea   Essential hypertension   Type 2 diabetes mellitus with complication (HCC)   Leukocytosis    Patient Profile:   Primary Cardiologist: New - Dr. Antoine Poche  65 yo male admitted on 09/28/2015 for acute anterior MI with cardiac arrest. S/p DES to LAD on 09/28/2015. Echo this admission shows EF of 30-35%.  Subjective   Patient denies any chest pain or shortness of breath. Says he is very thirsty and "if I cannot get something to drink, I am leaving this place and going to the store".  Inpatient Medications    . antiseptic oral rinse  7 mL Mouth Rinse q12n4p  . aspirin  81 mg Per Tube Daily  . atorvastatin  80 mg Oral q1800  . carvedilol  6.25 mg Per Tube BID WC  .  ceFAZolin (ANCEF) IV  2 g Intravenous 3 times per day  . chlorhexidine  15 mL Mouth Rinse BID  . heparin subcutaneous  5,000 Units Subcutaneous 3 times per day  . insulin aspart  0-20 Units Subcutaneous 6 times per day  . insulin glargine  10 Units Subcutaneous QHS  . lisinopril  2.5 mg Oral Daily  . pantoprazole sodium  40 mg Per Tube Daily  . sennosides  5 mL Oral BID  . sodium chloride flush  3 mL Intravenous Q12H  . spironolactone  12.5 mg Oral Daily  . ticagrelor  90 mg Oral BID    Vital Signs    Filed Vitals:   10/14/15 0000  10/14/15 0317 10/14/15 0400 10/14/15 0800  BP: 176/112  146/101 157/87  Pulse: 90  103 82  Temp: 99 F (37.2 C)  99 F (37.2 C) 98.8 F (37.1 C)  TempSrc: Oral  Oral Oral  Resp: 24     Height:      Weight:  140 lb 3.4 oz (63.6 kg)    SpO2: 99%  100% 98%    Intake/Output Summary (Last 24 hours) at 10/14/15 1100 Last data filed at 10/14/15 0505  Gross per 24 hour  Intake    455 ml  Output    950 ml  Net   -495 ml   Filed Weights   10/12/15 0500 10/13/15 0324 10/14/15 0317  Weight: 142 lb 13.7 oz (64.8 kg) 135 lb 12.9 oz (61.6 kg) 140 lb 3.4 oz (63.6 kg)    Physical Exam    General: Frail appearing African American male appearing in no acute distress. Head: Normocephalic, atraumatic.  Neck: Supple without bruits, JVD not elevated. Lungs:  Resp regular and unlabored, Coarse breath sounds with scattered rhonchi, no crackles Heart: RRR, S1, S2, no S3, S4, or murmur; no rub. Abdomen: Soft, non-tender, non-distended with normoactive bowel sounds. No hepatomegaly. No rebound/guarding. No obvious abdominal masses. Extremities: No clubbing, cyanosis, or edema. Distal  pedal pulses are 2+ bilaterally. Neuro: Alert and oriented X 3. Moves all extremities spontaneously. Psych: Normal affect.  Labs    CBC  Recent Labs  10/12/15 0345 10/13/15 0341 10/14/15 0308  WBC 16.2* 19.8* 17.8*  NEUTROABS 13.9*  --   --   HGB 8.0* 8.4* 9.4*  HCT 26.8* 28.4* 31.4*  MCV 80.0 81.1 81.1  PLT 603* 712* 811*   Basic Metabolic Panel  Recent Labs  10/13/15 0341 10/14/15 0308  NA 145 145  K 3.4* 3.8  CL 99* 105  CO2 32 27  GLUCOSE 206* 163*  BUN 40* 37*  CREATININE 1.09 1.03  CALCIUM 9.1 9.2  MG 2.4 2.7*  PHOS 3.2 3.2   Liver Function Tests  Recent Labs  10/13/15 0341 10/14/15 0308  ALBUMIN 2.9* 2.9*   Fasting Lipid Panel  Recent Labs  10/12/15 0345  TRIG 89   Thyroid Function Tests No results for input(s): TSH, T4TOTAL, T3FREE, THYROIDAB in the last 72  hours.  Invalid input(s): FREET3  Telemetry    NSR, HR in 80's - 90's. No atopic events.  ECG    No new tracings.   Cardiac Studies and Radiology    Mr Brain Wo Contrast: 10/05/2015  CLINICAL DATA:  Cardiac arrest, respiratory failure. Ventilated patient, not moving LEFT arm. Evaluate hemiplegia. EXAM: MRI HEAD WITHOUT CONTRAST TECHNIQUE: Multiplanar, multiecho pulse sequences of the brain and surrounding structures were obtained without intravenous contrast. COMPARISON:  CT head October 04, 2015 0304 hours FINDINGS: Focal 13 x 9 mm area of reduced diffusion LEFT posterior frontal deep white matter, with corresponding low ADC value. No susceptibility artifact to suggest hemorrhage. Intermediate FLAIR T2 hyperintense signal along RIGHT temporal horn. A few scattered supratentorial subcentimeter white matter FLAIR T2 hyperintensities. Tiny old cerebellar infarcts. Small LEFT caudate head lacunar infarct. No abnormal extra-axial fluid collections. Normal major intracranial vascular flow voids present at skull base. Status post RIGHT ocular lens implant in mild susceptibility artifact, likely representing glaucoma drainage implant. No abnormal sellar expansion. No cerebellar tonsillar ectopia. No suspicious calvarial bone marrow signal. Life-support lines in place. Mild paranasal sinus mucosal thickening with bilateral mastoid effusions. IMPRESSION: Acute small RIGHT for frontal lobe white matter infarct. Mild white matter changes compatible with chronic small vessel ischemic disease. 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 Abnormal RIGHT temporal periventricular signal favoring old ischemia and gliosis though somewhat atypical in location. Recommend follow-up MRI of the brain with contrast on a nonemergent basis. Electronically Signed   By: Awilda Metro M.D.   On: 10/05/2015 00:54   Dg Chest Port 1 View: 10/12/2015  CLINICAL DATA:  Shortness of breath, cardiac arrest, acute respiratory  failure with intubation. EXAM: PORTABLE CHEST 1 VIEW COMPARISON:  Portable chest x-ray of October 11, 2015 FINDINGS: The lungs are well-expanded. The interstitial markings remain increased but have improved somewhat since yesterday's study. The cardiac silhouette is top-normal in size. The central pulmonary vascularity is prominent. Cephalization has decreased. The mediastinum is normal in width. There is mild tortuosity of the descending thoracic aorta. The endotracheal tube tip lies approximately 7 cm above the carina. The esophagogastric tube tip projects below the inferior margin of the image. The left internal jugular venous catheter tip projects at the junction of the right and left brachiocephalic veins. IMPRESSION: Slight further interval improvement in the pulmonary interstitium consistent with resolving CHF. There is no alveolar pneumonia. The support tubes are in stable position. The endotracheal tube should be advanced approximately 3  cm as recommended on yesterday's study. Electronically Signed   By: David  [place] M.D.   On: 10/12/2015 07:27   Dg Abd Portable 1v: 10/13/2015  CLINICAL DATA:  NG tube advancement. EXAM: PORTABLE ABDOMEN - 1 VIEW COMPARISON:  10/13/2015 at 2152 hours FINDINGS: The enteric tube has been advanced, with tip and side hole now overlying the stomach, both well beyond the GE junction. A moderate amount of colonic stool is noted. There is no evidence of bowel obstruction. IMPRESSION: Interval advancement of enteric as above. Electronically Signed   By: Sebastian Ache M.D.   On: 10/13/2015 23:04   Echocardiogram: 10/06/2015 Study Conclusions - Left ventricle: The cavity size was mildly dilated. There was  mild concentric hypertrophy. Systolic function was moderately to  severely reduced. The estimated ejection fraction was in the  range of 30% to 35%. Doppler parameters are consistent with  abnormal left ventricular relaxation (grade 1 diastolic  dysfunction). - Regional  wall motion abnormality: Hypokinesis of the mid  anteroseptal, basal-mid anterolateral, apical septal, apical  lateral, and apical myocardium. - Aortic valve: Transvalvular velocity was within the normal range.  There was no stenosis. There was no regurgitation. - Mitral valve: Transvalvular velocity was within the normal range.  There was no evidence for stenosis. There was trivial  regurgitation. - Right ventricle: The cavity size was normal. Wall thickness was  normal. Systolic function was normal. - Atrial septum: No defect or patent foramen ovale was identified  by color flow Doppler. - Tricuspid valve: There was trivial regurgitation.  Assessment & Plan    1. Anterior STEMI - presented with an acute anterior MI with cardiac arrest on 09/28/2015. S/p DES to LAD at that time. Repeat cath on 10/08/2015 due to ST elevation showed patent stent.   - denies any current anginal symptoms. - Continue ASA, Brilinta, high-dose atorvastatin, and BB.  2. Cardiac arrest - Patient has been extubated. CCM signed off on 10/13/2015.  3. Acute systolic heart failure -  LVEF 30-35% by echo this admission.  - does not appear volume overloaded on physical exam. - continue BB and ACE-I.  4. CVA - Possibly cardioembolic. Patient will be continued on aspirin and brilinta for now. Plans noted to start warfarin in one month in addition to Plavix. He will need to be transitioned from brilinta to Plavix prior to initiation of warfarin. - the patient pulled his NG tube out again this morning. Voices he is thirsty this morning. Speech therapy will see today to perform a repeat swallow study. PT has evaluated the patient. Will get OT to evaluate as well.  5. Acute kidney injury - creatinine improved to 1.03 on 10/14/2015.  6. Acute respiratory failure - Suspected pneumonia. WBC at 17.8. Remains on Ancef.  7. Anemia  - Multifactorial, no active bleeding. - Hgb stable at 9.4 on  10/14/2015.  Signed, Ellsworth Lennox , PA-C 11:00 AM 10/14/2015 Pager: [phone]  Patient seen, examined. Available data reviewed. Agree with findings, assessment, and plan as outlined by Randall An, PA-C. The patient was independently interviewed and examined. The patient has failed his swallow study this morning. He will have another NG tube placed so that he can get his oral medications such as brilinta. He will go down for a formal swallow study tomorrow again. Undergoing PT/OT evaluation with recommendations noted for CIR. Current medical therapy is appropriate. We will have to watch his potassium closely as he is receiving aggressive supplementation and also is taking spironolactone. Right now his  potassium is in the low-normal range.  Tonny Bollman, M.D. 10/14/2015 11:56 AM

## 2015-10-14 NOTE — Progress Notes (Signed)
Rehab admissions - I met with patient at the bedside.  No other family were in the room.  I obtained patient permission to call his wife.  I called and left a message with wife.  Wife works.  I am not sure of available support after a potential inpatient rehab stay.  I will await call back from wife.  Call me for questions.  #[phone]

## 2015-10-14 NOTE — Progress Notes (Signed)
Nutrition Follow-up  DOCUMENTATION CODES:   Not applicable  INTERVENTION:   Recommend initiate Jevity 1.2  @ 20 ml/hr via cortrak tube and increase by 10 ml every 4 hours to goal rate of 70 ml/hr.   Tube feeding regimen provides 2016 kcal (100% of needs), 93 grams of protein, and 1356 ml of H2O.   NUTRITION DIAGNOSIS:   Inadequate oral intake related to inability to eat as evidenced by NPO status.  Ongoing  GOAL:   Patient will meet greater than or equal to 90% of their needs  Unmet  MONITOR:   Diet advancement, Labs, Weight trends, Skin, I & O's  REASON FOR ASSESSMENT:   Ventilator    ASSESSMENT:   The patient presented with chest pain. He is intubated and unresponsive. The history comes from EMS staff and his wife. He apparently started to have chest pain about one hour ago. Intial EKG showed sinus tachycardia with ST elevation in V2 through V4. EMS was called. He became unresponsive shortly before arriving in the ED.  Pt extubated on 10/12/15. Pt transferred from ICU to SDU on 10/13/15.   Pt working with therapy at time of visit.   SLP completed BSE today. Recommending continued NPO status due to wet vocal quality. Noted pt pulled out cortrak tube on 10/13/15. Spoke with RN, who confirmed that pt is unable to take pills PO and is requiring NGT. Plan is to place cortrak tube today. Tube placed and KUB confirmed placement in distal stomach. Per RN, no plan to start TF yet.  Labs reviewed: CBGS: 141-156, Mg: 2.7.   Diet Order:  Diet NPO time specified  Skin:  Reviewed, no issues  Last BM:  10/09/15  Height:   Ht Readings from Last 1 Encounters:  09/28/15 5\' 7"  (1.702 m)    Weight:   Wt Readings from Last 1 Encounters:  10/14/15 140 lb 3.4 oz (63.6 kg)    Ideal Body Weight:  67.27 kg  BMI:  Body mass index is 21.96 kg/(m^2).  Estimated Nutritional Needs:   Kcal:  1900-2100  Protein:  90-105 grams  Fluid:  1.9-2.1 L  EDUCATION NEEDS:   No  education needs identified at this time  Majesti Gambrell A. Mayford Knife, RD, LDN, CDE Pager: [phone] After hours Pager: [phone]

## 2015-10-14 NOTE — Progress Notes (Signed)
Speech Language Pathology Dysphagia Treatment Patient Details Name: Eddie [name] MRN: [id number] DOB: 25-Oct-1950 Today's Date: 10/14/2015 Time: 0223-3612 SLP Time Calculation (min) (ACUTE ONLY): 15 min  Assessment / Plan / Recommendation Clinical Impression    Pt requesting "something cold" upon SLP arrival. Observed wet vocal quality, immediate and delayed throat clearing and coughing and multiple swallows during intake of ice chips, thin liquids and puree. Suspect delayed swallow initiation as well. SLP provided mod verbal cues to take small sips. Pt educated re: consequences of aspiration, s/s of penetration/aspiration observed and need for instrumental testing. SLP will perform MBS next date to r/o penetration/aspiration and/or determine least restrictive diet.    Diet Recommendation    NPO   SLP Plan MBS;New goals to be determined pending instrumental study      Swallowing Goals     General Behavior/Cognition: Cooperative;Pleasant mood;Alert Patient Positioning: Upright in bed Oral care provided: Yes HPI: 65 year old male admitted 3/7 chest pain, becoming unresponsive shortely before arrival to ED. Patient with cardiac arrest (brief chest compressions),  NSTEMI, intubated. Possible CVA symptoms noted on 3/13. 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 Extubated 3/20.   Oral Cavity - Oral Hygiene     Dysphagia Treatment Treatment Methods: Skilled observation;Differential diagnosis;Patient/caregiver education Patient observed directly with PO's: Yes Type of PO's observed: Dysphagia 1 (puree);Thin liquids;Ice chips Feeding: Able to feed self Liquids provided via: Cup;No straw;Teaspoon Oral Phase Signs & Symptoms: Other (comment) (none) Pharyngeal Phase Signs & Symptoms: Suspected delayed swallow initiation;Multiple swallows;Wet vocal quality;Immediate throat clear;Delayed throat clear;Immediate cough;Delayed cough Type of cueing:  Verbal Amount of cueing: Moderate   GO     Eddie [name] 10/14/2015, 12:56 PM

## 2015-10-14 NOTE — Progress Notes (Signed)
Patient pulled out NG tube this morning. Patient previously had a Cortrak placed that he pulled out 10/13/2015. Will see if Cortrak team can put this back in 10/14/2015. Notified Elink RN of situation at this time.  Will continue to monitor. Milon Dikes, RN

## 2015-10-14 NOTE — Progress Notes (Signed)
Physical Therapy Treatment Patient Details Name: Eddie [name] MRN: [id number] DOB: 09-Dec-1950 Today's Date: 10/14/2015    History of Present Illness 65 year old male admitted 3/7 chest pain, becoming unresponsive shortely before arrival to ED. Patient with cardiac arrest (brief chest compressions), NSTEMI, intubated. Possible CVA symptoms noted on 3/13. 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 Extubated 3/20    PT Comments    Patient seen for therapy progression. Patient tolerated well and demonstrates some overall functional improvements and return in left side. Patient was able to sit unsupported by assist at EOB for >10 sec x5 this session. Patient also tolerated pre gait activities well. Patient continues to require assist for mobility secondary to left sided weakness and also continues to demonstrate cognitive deficits and decreased insight/awareness. Will continue to see and progress as tolerated. Feel CIR is ideal disposition for this patient. Highly recommend OT consult re: Stroke.  Follow Up Recommendations  CIR     Equipment Recommendations   (TBD)    Recommendations for Other Services Rehab consult;OT consult     Precautions / Restrictions Precautions Precautions: Fall Restrictions Weight Bearing Restrictions: No    Mobility  Bed Mobility Overal bed mobility: Needs Assistance Bed Mobility: Sit to Supine       Sit to supine: Mod assist   General bed mobility comments: received sitting EOB with nsg upon arrival. Moderate assist to return to supine. Assist for trunk control and elevation of LEs  Transfers Overall transfer level: Needs assistance Equipment used:  (+2 face to face for pivot to chair, bed rail for pre-gait)   Sit to Stand: Mod assist;+2 physical assistance Stand pivot transfers: +2 physical assistance;Mod assist       General transfer comment: +2 assist to pivot to chair. LLE buckling requiring  knee block and facilitation of upright posture performed. Patient tolerated sit to stand transfer training and pre gait, manual assist to place LUE on bed rail, patient able to initiate power up to standing but had poor quad set ability to maintain during dynamic standing.  Ambulation/Gait                 Stairs            Wheelchair Mobility    Modified Rankin (Stroke Patients Only)       Balance   Sitting-balance support: Bilateral upper extremity supported Sitting balance-Leahy Scale: Poor (to fair) Sitting balance - Comments: Patient with improvements noted in trunk control. Able to maintain static sitting EOB for >10 seconds, patient also showing signs of corrective righting reaction with trunk to adjust to lateral lean. Better overall trunk proprioception to midline today Postural control: Left lateral lean Standing balance support: During functional activity Standing balance-Leahy Scale: Poor                      Cognition Arousal/Alertness: Awake/alert Behavior During Therapy: Flat affect Overall Cognitive Status: Impaired/Different from baseline Area of Impairment: Orientation;Attention;Memory;Following commands;Safety/judgement;Awareness;Problem solving Orientation Level: Disoriented to;Time;Situation Current Attention Level: Focused Memory: Decreased short-term memory Following Commands: Follows one step commands consistently Safety/Judgement: Decreased awareness of safety;Decreased awareness of deficits Awareness: Intellectual Problem Solving: Slow processing;Difficulty sequencing;Requires verbal cues;Requires tactile cues General Comments: patient continues to demonstrate decreased awareness of deficits    Exercises Other Exercises Other Exercises: Pre Gait training, weight in standing with bilateral UE support and assist Other Exercises: Lateral weight shifts in standing (pre-Gait) Other Exercises: Marching in standing (pre-gait)  General Comments        Pertinent Vitals/Pain Pain Assessment: No/denies pain    Home Living                      Prior Function            PT Goals (current goals can now be found in the care plan section) Acute Rehab PT Goals Patient Stated Goal: none stated PT Goal Formulation: With patient Time For Goal Achievement: 10/27/15 Potential to Achieve Goals: Good Progress towards PT goals: Progressing toward goals    Frequency  Min 4X/week    PT Plan Current plan remains appropriate    Co-evaluation             End of Session Equipment Utilized During Treatment: Gait belt Activity Tolerance: Patient tolerated treatment well Patient left: in bed;with call bell/phone within reach;with bed alarm set     Time: 1020-1043 PT Time Calculation (min) (ACUTE ONLY): 23 min  Charges:  $Therapeutic Activity: 23-37 mins                    G CodesFabio Asa November 07, 2015, 10:57 AM Charlotte Crumb, PT DPT  [phone]

## 2015-10-14 NOTE — Progress Notes (Signed)
Called by RN- pt fell out of bed and suffered a facial contusion. He is on SQ Heparin. Will check head CT.  Jeylin Woodmansee PA-C 10/14/2015 8:02 PM

## 2015-10-15 ENCOUNTER — Inpatient Hospital Stay (HOSPITAL_COMMUNITY): Payer: BLUE CROSS/BLUE SHIELD

## 2015-10-15 ENCOUNTER — Encounter (HOSPITAL_COMMUNITY): Payer: Self-pay | Admitting: Student

## 2015-10-15 ENCOUNTER — Inpatient Hospital Stay (HOSPITAL_COMMUNITY)
Admission: RE | Admit: 2015-10-15 | Discharge: 2015-10-26 | DRG: 056 | Disposition: A | Discharge status: Home / Self Care | Payer: BLUE CROSS/BLUE SHIELD | Source: Intra-hospital | Attending: Physical Medicine & Rehabilitation | Admitting: Physical Medicine & Rehabilitation

## 2015-10-15 DIAGNOSIS — E118 Type 2 diabetes mellitus with unspecified complications: Secondary | ICD-10-CM

## 2015-10-15 DIAGNOSIS — Z955 Presence of coronary angioplasty implant and graft: Secondary | ICD-10-CM | POA: Diagnosis not present

## 2015-10-15 DIAGNOSIS — G931 Anoxic brain damage, not elsewhere classified: Secondary | ICD-10-CM

## 2015-10-15 DIAGNOSIS — Y92239 Unspecified place in hospital as the place of occurrence of the external cause: Secondary | ICD-10-CM

## 2015-10-15 DIAGNOSIS — Z79899 Other long term (current) drug therapy: Secondary | ICD-10-CM

## 2015-10-15 DIAGNOSIS — E876 Hypokalemia: Secondary | ICD-10-CM | POA: Diagnosis not present

## 2015-10-15 DIAGNOSIS — N179 Acute kidney failure, unspecified: Secondary | ICD-10-CM

## 2015-10-15 DIAGNOSIS — I5023 Acute on chronic systolic (congestive) heart failure: Secondary | ICD-10-CM

## 2015-10-15 DIAGNOSIS — I5021 Acute systolic (congestive) heart failure: Secondary | ICD-10-CM

## 2015-10-15 DIAGNOSIS — S0191XA Laceration without foreign body of unspecified part of head, initial encounter: Secondary | ICD-10-CM | POA: Diagnosis not present

## 2015-10-15 DIAGNOSIS — I69391 Dysphagia following cerebral infarction: Principal | ICD-10-CM

## 2015-10-15 DIAGNOSIS — J15211 Pneumonia due to Methicillin susceptible Staphylococcus aureus: Secondary | ICD-10-CM | POA: Diagnosis not present

## 2015-10-15 DIAGNOSIS — E1165 Type 2 diabetes mellitus with hyperglycemia: Secondary | ICD-10-CM

## 2015-10-15 DIAGNOSIS — I639 Cerebral infarction, unspecified: Secondary | ICD-10-CM

## 2015-10-15 DIAGNOSIS — N189 Chronic kidney disease, unspecified: Secondary | ICD-10-CM

## 2015-10-15 DIAGNOSIS — W19XXXA Unspecified fall, initial encounter: Secondary | ICD-10-CM | POA: Diagnosis not present

## 2015-10-15 DIAGNOSIS — E1140 Type 2 diabetes mellitus with diabetic neuropathy, unspecified: Secondary | ICD-10-CM | POA: Diagnosis present

## 2015-10-15 DIAGNOSIS — I69354 Hemiplegia and hemiparesis following cerebral infarction affecting left non-dominant side: Secondary | ICD-10-CM

## 2015-10-15 DIAGNOSIS — R1310 Dysphagia, unspecified: Secondary | ICD-10-CM

## 2015-10-15 DIAGNOSIS — I10 Essential (primary) hypertension: Secondary | ICD-10-CM | POA: Diagnosis present

## 2015-10-15 DIAGNOSIS — D649 Anemia, unspecified: Secondary | ICD-10-CM

## 2015-10-15 DIAGNOSIS — I69991 Dysphagia following unspecified cerebrovascular disease: Secondary | ICD-10-CM | POA: Diagnosis not present

## 2015-10-15 DIAGNOSIS — R071 Chest pain on breathing: Secondary | ICD-10-CM | POA: Diagnosis not present

## 2015-10-15 DIAGNOSIS — I2109 ST elevation (STEMI) myocardial infarction involving other coronary artery of anterior wall: Secondary | ICD-10-CM

## 2015-10-15 LAB — GLUCOSE, CAPILLARY
GLUCOSE-CAPILLARY: 127 mg/dL — AB (ref 65–99)
GLUCOSE-CAPILLARY: 176 mg/dL — AB (ref 65–99)
GLUCOSE-CAPILLARY: 93 mg/dL (ref 65–99)
GLUCOSE-CAPILLARY: 99 mg/dL (ref 65–99)
Glucose-Capillary: 251 mg/dL — ABNORMAL HIGH (ref 65–99)
Glucose-Capillary: 386 mg/dL — ABNORMAL HIGH (ref 65–99)

## 2015-10-15 LAB — RENAL FUNCTION PANEL
ANION GAP: 14 (ref 5–15)
Albumin: 3.1 g/dL — ABNORMAL LOW (ref 3.5–5.0)
BUN: 41 mg/dL — AB (ref 6–20)
CHLORIDE: 106 mmol/L (ref 101–111)
CO2: 28 mmol/L (ref 22–32)
Calcium: 9.1 mg/dL (ref 8.9–10.3)
Creatinine, Ser: 1.1 mg/dL (ref 0.61–1.24)
GFR calc Af Amer: >60 mL/min (ref >60)
GFR calc non Af Amer: >60 mL/min (ref >60)
GLUCOSE: 95 mg/dL (ref 65–99)
POTASSIUM: 3.3 mmol/L — AB (ref 3.5–5.1)
Phosphorus: 3.8 mg/dL (ref 2.5–4.6)
Sodium: 148 mmol/L — ABNORMAL HIGH (ref 135–145)

## 2015-10-15 MED ORDER — LISINOPRIL 2.5 MG PO TABS
2.5000 mg | ORAL_TABLET | Freq: Every day | ORAL | Status: DC
  Administered 2015-10-16 – 2015-10-26 (×11): 2.5 mg via ORAL
  Filled 2015-10-15 (×11): qty 1

## 2015-10-15 MED ORDER — INSULIN ASPART 100 UNIT/ML ~~LOC~~ SOLN
0.0000 [IU] | SUBCUTANEOUS | Status: DC
  Administered 2015-10-15: 20 [IU] via SUBCUTANEOUS
  Administered 2015-10-16: 11 [IU] via SUBCUTANEOUS
  Administered 2015-10-16: 4 [IU] via SUBCUTANEOUS

## 2015-10-15 MED ORDER — SENNA 8.6 MG PO TABS
2.0000 | ORAL_TABLET | Freq: Every day | ORAL | Status: DC
  Administered 2015-10-15 – 2015-10-25 (×11): 17.2 mg via ORAL
  Filled 2015-10-15 (×11): qty 2

## 2015-10-15 MED ORDER — CHLORHEXIDINE GLUCONATE 0.12 % MT SOLN
15.0000 mL | Freq: Two times a day (BID) | OROMUCOSAL | Status: DC
  Administered 2015-10-15 – 2015-10-26 (×17): 15 mL via OROMUCOSAL
  Filled 2015-10-15 (×17): qty 15

## 2015-10-15 MED ORDER — PROCHLORPERAZINE EDISYLATE 5 MG/ML IJ SOLN: 5.0000 mg | Freq: Four times a day (QID) | INTRAMUSCULAR | Status: DC | PRN

## 2015-10-15 MED ORDER — FLEET ENEMA 7-19 GM/118ML RE ENEM: 1.0000 | ENEMA | Freq: Once | RECTAL | Status: DC | PRN

## 2015-10-15 MED ORDER — CARVEDILOL 6.25 MG PO TABS
6.2500 mg | ORAL_TABLET | Freq: Two times a day (BID) | ORAL | Status: DC
  Administered 2015-10-16 – 2015-10-26 (×20): 6.25 mg
  Filled 2015-10-15 (×20): qty 1

## 2015-10-15 MED ORDER — LORAZEPAM 1 MG PO TABS
1.0000 mg | ORAL_TABLET | Freq: Four times a day (QID) | ORAL | Status: DC | PRN
  Administered 2015-10-15 – 2015-10-16 (×2): 1 mg via ORAL
  Filled 2015-10-15 (×2): qty 1

## 2015-10-15 MED ORDER — PROCHLORPERAZINE MALEATE 5 MG PO TABS: 5.0000 mg | ORAL_TABLET | Freq: Four times a day (QID) | ORAL | Status: DC | PRN

## 2015-10-15 MED ORDER — PROCHLORPERAZINE 25 MG RE SUPP: 12.5000 mg | Freq: Four times a day (QID) | RECTAL | Status: DC | PRN

## 2015-10-15 MED ORDER — ENOXAPARIN SODIUM 40 MG/0.4ML ~~LOC~~ SOLN
40.0000 mg | SUBCUTANEOUS | Status: DC
  Administered 2015-10-15 – 2015-10-25 (×11): 40 mg via SUBCUTANEOUS
  Filled 2015-10-15 (×11): qty 0.4

## 2015-10-15 MED ORDER — INSULIN GLARGINE 100 UNIT/ML ~~LOC~~ SOLN
10.0000 [IU] | Freq: Every day | SUBCUTANEOUS | Status: DC
Start: 2015-10-15 — End: 2015-10-25
  Administered 2015-10-15 – 2015-10-24 (×10): 10 [IU] via SUBCUTANEOUS
  Filled 2015-10-15 (×11): qty 0.1

## 2015-10-15 MED ORDER — ALUM & MAG HYDROXIDE-SIMETH 200-200-20 MG/5ML PO SUSP: 30.0000 mL | ORAL | Status: DC | PRN

## 2015-10-15 MED ORDER — RESOURCE THICKENUP CLEAR PO POWD
ORAL | Status: DC | PRN
  Filled 2015-10-15 (×3): qty 125

## 2015-10-15 MED ORDER — CETYLPYRIDINIUM CHLORIDE 0.05 % MT LIQD
7.0000 mL | Freq: Two times a day (BID) | OROMUCOSAL | Status: DC
  Administered 2015-10-17 – 2015-10-25 (×12): 7 mL via OROMUCOSAL

## 2015-10-15 MED ORDER — TICAGRELOR 90 MG PO TABS
90.0000 mg | ORAL_TABLET | Freq: Two times a day (BID) | ORAL | Status: DC
  Administered 2015-10-15 – 2015-10-26 (×22): 90 mg via ORAL
  Filled 2015-10-15 (×22): qty 1

## 2015-10-15 MED ORDER — DIPHENHYDRAMINE HCL 12.5 MG/5ML PO ELIX: 12.5000 mg | ORAL_SOLUTION | Freq: Four times a day (QID) | ORAL | Status: DC | PRN

## 2015-10-15 MED ORDER — ACETAMINOPHEN 325 MG PO TABS
650.0000 mg | ORAL_TABLET | ORAL | Status: DC | PRN
  Administered 2015-10-18 – 2015-10-25 (×5): 650 mg via ORAL
  Filled 2015-10-15 (×5): qty 2

## 2015-10-15 MED ORDER — PANTOPRAZOLE SODIUM 40 MG PO PACK
40.0000 mg | PACK | Freq: Every day | ORAL | Status: DC
Start: 2015-10-16 — End: 2015-10-26
  Administered 2015-10-17 – 2015-10-26 (×9): 40 mg
  Filled 2015-10-15 (×12): qty 20

## 2015-10-15 MED ORDER — ASPIRIN 81 MG PO CHEW
81.0000 mg | CHEWABLE_TABLET | Freq: Every day | ORAL | Status: DC
  Administered 2015-10-16 – 2015-10-26 (×11): 81 mg
  Filled 2015-10-15 (×12): qty 1

## 2015-10-15 MED ORDER — TRAZODONE HCL 50 MG PO TABS
25.0000 mg | ORAL_TABLET | Freq: Every evening | ORAL | Status: DC | PRN
  Administered 2015-10-15 – 2015-10-21 (×4): 50 mg via ORAL
  Filled 2015-10-15 (×4): qty 1

## 2015-10-15 MED ORDER — BISACODYL 10 MG RE SUPP: 10.0000 mg | Freq: Every day | RECTAL | Status: DC | PRN

## 2015-10-15 MED ORDER — SPIRONOLACTONE 25 MG PO TABS
12.5000 mg | ORAL_TABLET | Freq: Every day | ORAL | Status: DC
  Administered 2015-10-16 – 2015-10-26 (×11): 12.5 mg via ORAL
  Filled 2015-10-15 (×11): qty 1

## 2015-10-15 MED ORDER — ALBUTEROL SULFATE (2.5 MG/3ML) 0.083% IN NEBU: 2.5000 mg | INHALATION_SOLUTION | RESPIRATORY_TRACT | Status: DC | PRN

## 2015-10-15 MED ORDER — RESOURCE THICKENUP CLEAR PO POWD
ORAL | Status: DC | PRN
  Filled 2015-10-15: qty 125

## 2015-10-15 MED ORDER — ATORVASTATIN CALCIUM 80 MG PO TABS
80.0000 mg | ORAL_TABLET | Freq: Every day | ORAL | Status: DC
  Administered 2015-10-17 – 2015-10-25 (×9): 80 mg via ORAL
  Filled 2015-10-15 (×9): qty 1

## 2015-10-15 MED ORDER — GUAIFENESIN-DM 100-10 MG/5ML PO SYRP: 5.0000 mL | ORAL_SOLUTION | Freq: Four times a day (QID) | ORAL | Status: DC | PRN

## 2015-10-15 NOTE — PMR Pre-admission (Signed)
PMR Admission Coordinator Pre-Admission Assessment  Patient: Eddie [name] is an 65 y.o., male MRN: [id number] DOB: January 24, 1951 Height: 5\' 7"  (170.2 cm) Weight: 57.607 kg (127 lb)              Insurance Information HMO:     PPO:  Yes    PCP:      IPA:      80/20:      OTHER:  PRIMARY:  BCBS      Policy#: [id number]      Subscriber: Ronnell Freshwater CM Name: [name]      Phone#: [phone]     Fax#: [phone] Pre-Cert#: [id number] from 09/2415 to 10/25/15 with update due 10/25/15 if not discharged     Employer: [employer] Benefits:  Phone #: [phone]     Name: On line Eff. 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 male who was admitted on 09/28/15 with chest pain with ST elevation due to anterior STEMI and developed unresponsiveness enroute to ED. He was intubated and treated with brief compressions and epi for PEA. He was taken to cath lab urgently and DES placed to LAD. 2D echo with EF 10-15%. He was treated with hypothermia protocol and during rewarming was noted to have agitation as well as left sided weakness on 03/13. 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 He developed copious oral secretions due to MSSA HCAP and  was started on Vanc/Fortaz for treatment. Repeat echo with EF 30-35% with diffuse hypokinesis and no PFO or thrombus. EEG ordered due to fluctuating MS and showed generalized background slowing due to "pharmacologic, hypoxic, toxic-metabolic or other diffuse physiologic etiology". Treated with ARDS protocol and tolerated extubation on 03/21. Dr. Roda Shutters felt that stroke possibly cardio-embolic. Patient to continue ASA/Brilinta for now and start warfarin/plavix in about a month. Acute systolic failure treated with Ace, coreg and aldactone--question LiveVest depending on cognitive recovery per Dr. Shirlee Latch. Swallow evaluation done today and NPO recommended due to aphonia, stridorous inhalations and evidence of severe dysphagia. PT evaluation done today and patient with resultant Left sided weakness, poor posture and cognitive deficits. CIR recommended for follow up therapy.   Note:  Patient fell from bed last evening.  He had a sitter at the bedside this am.  His bed was lowered and mats were in place to prevent further fall or potential injury.  He was started on a diet today of Dys1, pudding thick liquids.    Past Medical History  Past Medical History  Diagnosis Date  . PUD (peptic  ulcer disease)     Family History  family history is not on file.  Prior Rehab/Hospitalizations: No previous rehab admissions  Has the patient had major surgery during 100 days prior to admission? No  Current Medications   Current facility-administered medications:  .  0.9 %  sodium chloride infusion, , Intravenous, Continuous, Runell Gess, MD, Last Rate: 10 mL/hr at 10/14/15 0700 .  0.9 %  sodium chloride infusion, 250 mL, Intravenous, PRN, Lyn Records, MD, Last Rate: 10 mL/hr at 10/12/15 0700, 250 mL at 10/12/15 0700 .  acetaminophen (TYLENOL) tablet 650 mg, 650 mg, Oral, Q4H PRN, Lyn Records, MD .  albuterol (PROVENTIL) (2.5 MG/3ML) 0.083% nebulizer solution 2.5 mg, 2.5 mg, Nebulization, Q3H PRN, Rahul P  Desai, PA-C, 2.5 mg at 10/12/15 1540 .  antiseptic oral rinse (CPC / CETYLPYRIDINIUM CHLORIDE 0.05%) solution 7 mL, 7 mL, Mouth Rinse, q12n4p, Alyson Reedy, MD, 7 mL at 10/15/15 1200 .  aspirin chewable tablet 81 mg, 81 mg, Per Tube, Daily, Rahul P Desai, PA-C, 81 mg at 10/15/15 0927 .  atorvastatin (LIPITOR) tablet 80 mg, 80 mg, Oral, q1800, Laurey Morale, MD, 80 mg at 10/13/15 1743 .  carvedilol (COREG) tablet 6.25 mg, 6.25 mg, Per Tube, BID WC, Laurey Morale, MD, 6.25 mg at 10/15/15 4098 .  ceFAZolin (ANCEF) IVPB 2g/100 mL premix, 2 g, Intravenous, 3 times per day, Runell Gess, MD, 2 g at 10/15/15 1428 .  chlorhexidine (PERIDEX) 0.12 % solution 15 mL, 15 mL, Mouth Rinse, BID, Alyson Reedy, MD, 15 mL at 10/15/15 1000 .  heparin injection 5,000 Units, 5,000 Units, Subcutaneous, 3 times per day, Laurey Morale, MD, 5,000 Units at 10/15/15 1349 .  Influenza vac split quadrivalent PF (FLUARIX) injection 0.5 mL, 0.5 mL, Intramuscular, Prior to discharge, Runell Gess, MD .  insulin aspart (novoLOG) injection 0-20 Units, 0-20 Units, Subcutaneous, 6 times per day, Alyson Reedy, MD, 4 Units at 10/15/15 1220 .  insulin glargine (LANTUS) injection 10 Units, 10 Units, Subcutaneous, QHS, Coralyn Helling, MD, 10 Units at 10/13/15 2350 .  lisinopril (PRINIVIL,ZESTRIL) tablet 2.5 mg, 2.5 mg, Oral, Daily, Laurey Morale, MD, 2.5 mg at 10/15/15 1000 .  LORazepam (ATIVAN) tablet 1 mg, 1 mg, Oral, Q4H PRN, Lenon Oms, MD, 1 mg at 10/15/15 1210 .  nitroGLYCERIN 50 mg in dextrose 5 % 250 mL (0.2 mg/mL) infusion, 10 mcg/min, Intravenous, Continuous, Lyn Records, MD, 10 mcg/min at 10/08/15 1143 .  ondansetron (ZOFRAN) injection 4 mg, 4 mg, Intravenous, Q6H PRN, Runell Gess, MD .  pantoprazole sodium (PROTONIX) 40 mg/20 mL oral suspension 40 mg, 40 mg, Per Tube, Daily, Marquita Palms, RPH, 40 mg at 10/15/15 1139 .  RESOURCE THICKENUP CLEAR, , Oral, PRN, Runell Gess, MD .  sennosides  (SENOKOT) 8.8 MG/5ML syrup 5 mL, 5 mL, Oral, BID, Jose Alexis Frock, MD, 5 mL at 10/15/15 [phone] .  sodium chloride flush (NS) 0.9 % injection 3 mL, 3 mL, Intravenous, Q12H, Lyn Records, MD, 3 mL at 10/15/15 1222 .  sodium chloride flush (NS) 0.9 % injection 3 mL, 3 mL, Intravenous, PRN, Lyn Records, MD .  spironolactone (ALDACTONE) tablet 12.5 mg, 12.5 mg, Oral, Daily, Luis Abed, MD, 12.5 mg at 10/15/15 1000 .  ticagrelor (BRILINTA) tablet 90 mg, 90 mg, Oral, BID, Runell Gess, MD, 90 mg at 10/15/15 4782  Patients Current Diet: DIET - DYS 1 Room service  appropriate?: Yes; Fluid consistency:: Pudding Thick  Precautions / Restrictions Precautions Precautions: Fall Restrictions Weight Bearing Restrictions: No   Has the patient had 2 or more falls or a fall with injury in the past year?No  Prior Activity Level Community (5-7x/wk): Went out daily.  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  Was driving.  Home Assistive Devices / Equipment Home Assistive Devices/Equipment: None Home Equipment: None  Prior Device Use: Indicate devices/aids used by the patient prior to current illness, exacerbation or injury? None  Prior Functional Level Prior Function Level of Independence: Independent  Self Care: Did the patient need help bathing, dressing, using the toilet or eating?  Independent  Indoor Mobility: Did the patient need assistance with walking from room to room (with or without device)? Independent  Stairs: Did the patient need assistance with internal or external stairs (with or without device)? Independent  Functional Cognition: Did the patient need help planning regular tasks such as shopping or remembering to take medications? Independent  Current Functional Level Cognition  Overall Cognitive Status: Impaired/Different from baseline Current Attention Level: Selective Orientation Level: Oriented to place, Oriented to person, Disoriented to time, Disoriented to  situation Following Commands: Follows multi-step commands with increased time Safety/Judgement: Decreased awareness of safety, Decreased awareness of deficits General Comments: Pt able to verbalize stroke and deficits on L side. pt recalled someone visiting yesterday about therapy but describes it as "outpatient" pt with no recall of fall in PM hours ( or is choosing not to report it when challenged with questioning) pt demonstrates poor recall during balanace / basic transfer in hallway with room number. pt educated room number2h21. pt states it back to therapist. pt reading next room number in hall. Pt asked is this your room. pt states "yes " and used visual cues of unfamiliar visitors in room and family verbalized "no we dont know him" to decide not to enter the room. Pt completed this same sequence at the next room. Pt choose the third room as his room because no one was present in the room and guessing.     Extremity Assessment (includes Sensation/Coordination)  Upper Extremity Assessment: LUE deficits/detail LUE Deficits / Details: 3 out 5 MMT present and able to hold against gravity. pt able to snap, clap and show two fingers.   Lower Extremity Assessment: Defer to PT evaluation LLE Deficits / Details: LLE weakness noted, 2+/5 gross motions, unable to complete full range  LLE Coordination: decreased fine motor, decreased gross motor    ADLs  Overall ADL's : Needs assistance/impaired    Mobility  Overal bed mobility: Needs Assistance Bed Mobility: Supine to Sit Supine to sit: Min guard Sit to supine: Mod assist General bed mobility comments: pt able to exit bed on L side with cues to complete task. pt able to static sit EOB without (A)     Transfers  Overall transfer level: Needs assistance Equipment used: 2 person hand held assist Transfers: Sit to/from Stand Sit to Stand: +2 physical assistance, Min assist Stand pivot transfers: +2 physical assistance, Mod assist General  transfer comment: Pt requires (A) for balance immediately upon standing. Pt with posterior L lean initially. pt with weight shift task to help with awareness in space.    Ambulation / Gait / Stairs / Wheelchair Mobility  Ambulation/Gait Ambulation/Gait assistance: +2 physical assistance, Mod assist Ambulation Distance (Feet): 50 Feet Assistive device: 2 person hand held assist (wrap around support with gait belt) Gait Pattern/deviations: Step-to pattern, Decreased  stride length, Ataxic, Trunk flexed, Staggering left, Staggering right, Narrow base of support, Scissoring General Gait Details: patient with poor coordination and difficulty sequencing steps. Max facilitation for weight shifts and tactile as well as visual cues for placement of LLE during loading response Gait velocity: decreased    Posture / Balance Dynamic Sitting Balance Sitting balance - Comments: patient able to self support in sitting with no physical assist required to maintain midline today Balance Overall balance assessment: Needs assistance Sitting-balance support: Bilateral upper extremity supported Sitting balance-Leahy Scale: Fair Sitting balance - Comments: patient able to self support in sitting with no physical assist required to maintain midline today Postural control: Left lateral lean Standing balance support: During functional activity Standing balance-Leahy Scale: Poor High Level Balance Comments: Pt provided visual input of color contrrast of tile during transfer brace to help with widen base of support . pt with L LE scissor gait and adducting to midline. pt required tactile input for shoulder widen apart LE base of support. Pt able to use visual feedback to correct base of support. Pt needed total +2 facilitation for weight shift to decr scissor gait pattern with cues for neck extension.    Special needs/care consideration BiPAP/CPAP No CPM No Continuous Drip IV KVO Dialysis No         Life Vest  No Oxygen No Special Bed No Trach Size No Wound Vac (area) No     Skin No                           Bowel mgmt: Last BM 10/14/15 Bladder mgmt: Incontinence Diabetic mgmt No    Previous Home Environment Living Arrangements: Spouse/significant other Available Help at Discharge: Family Type of Home: House Home Access: Stairs to enter Secretary/administrator of Steps: 1 Home Care Services: No Additional Comments: works at [employer] with wife   Discharge Living Setting Plans for Discharge Living Setting: House, Lives with (comment) Type of Home at Discharge: House Discharge Home Layout: One level Discharge Home Access: Stairs to enter Entrance Stairs-Number of Steps: 1 Does the patient have any problems obtaining your medications?: No  Social/Family/Support Systems Patient Roles: Spouse (Has a wife.) Contact Information: Val Denigris - spouse Anticipated Caregiver: wife Anticipated Caregiver's Contact Information: [name] - wife [phone] Ability/Limitations of Caregiver: Wife works 3 days a week, Sun, Mon, Tues 10 am to 9 pm Caregiver Availability: Other (Comment) (Wife not sure if she can stay with patient, she was to work.) Discharge Plan Discussed with Primary Caregiver: Yes Is Caregiver In Agreement with Plan?: Yes Does Caregiver/Family have Issues with Lodging/Transportation while Pt is in Rehab?: No  Goals/Additional Needs Patient/Family Goal for Rehab: PT/OT supervision and min assist, ST mod I and supervision goals Expected length of stay: 15-19 days Cultural Considerations: None Dietary Needs: Dys1, pudding thick liquids Equipment Needs: TBD Pt/Family Agrees to Admission and willing to participate: Yes Program Orientation Provided & Reviewed with Pt/Caregiver Including Roles  & Responsibilities: Yes  Decrease burden of Care through IP rehab admission: N/A  Possible need for SNF placement upon discharge: Yes, if patient does not progress to where wife can manage at home  or if wife cannot take time off to stay with patient at discharge  Patient Condition: This patient's condition remains as documented in the consult dated 10/13/15, in which the Rehabilitation Physician determined and documented that the patient's condition is appropriate for intensive rehabilitative care in an inpatient rehabilitation facility pending caregiver support.  Wife works 3 days a week.  Wife may be able to take short amount of time off to stay with patient.  May need SNF at the end of the rehab stay. These areas have been addressed. I discussed the above with Dr. Riley Kill and he is agreeable to inpatient rehab admission.  Will admit to inpatient rehab today.  Preadmission Screen Completed By:  Trish Mage, 10/15/2015 3:27 PM ______________________________________________________________________   Discussed status with Dr. Riley Kill on 10/15/15 at 1501 and received telephone approval for admission today.  Admission Coordinator:  Trish Mage, time1527/Date03/24/17

## 2015-10-15 NOTE — Progress Notes (Addendum)
Pt. Bed alarm went off, pt. Fell off the bed and found in the floor mat sitting with both hands holding the siderails and head lean to the siderails., patient was able to stand with assis noted some weakness on the left lower leg and dragging. Neuro intact with some confusion as baseline per report pt. Has left sided weakness initially . Very restless on and off but able to calm down after reassurance.  Noted also some bleeding from his nose. MD on call was paged. Dory Peru was notified with orders made and 2nd call was made to Dr. Terressa Koyanagi notified of patients fall with no IV access,  with orders made as well. Ativan 1mg . Was given to the tube prior to Ct scan. Wife was notified of patient fall and will come to stay for awhile until patient calms down and compliant of the treatment. Bedside sitter was ordered. Will continue to monitor patient.

## 2015-10-15 NOTE — Progress Notes (Signed)
Ngt removed although waited for cortrak team to come and assess it but nobody came.

## 2015-10-15 NOTE — Progress Notes (Signed)
Nutrition Follow-up  DOCUMENTATION CODES:   Not applicable  INTERVENTION:   -Magic Cup TID with meals  NUTRITION DIAGNOSIS:   Predicted suboptimal nutrient intake related to dysphagia as evidenced by per patient/family report.  Ongoing  GOAL:   Patient will meet greater than or equal to 90% of their needs  Unmet  MONITOR:   PO intake, Supplement acceptance, Diet advancement, Labs, Weight trends, Skin, I & O's  REASON FOR ASSESSMENT:   Ventilator    ASSESSMENT:   The patient presented with chest pain. He is intubated and unresponsive. The history comes from EMS staff and his wife. He apparently started to have chest pain about one hour ago. Intial EKG showed sinus tachycardia with ST elevation in V2 through V4. EMS was called. He became unresponsive shortly before arriving in the ED.  Case discussed with RN, nurse tech, and nursing unit director. Pt just returned from [facility]; pt has been advanced to dysphagia 1 diet with pudding thick liquids. Nurse tech, who was present for MBSS, reports that pt has been very impulsive with PO intake. RN confirms that pt has been vocalized desire to eat.   Discussed appropriate foods and liquids with RN and nurse tech on pt's prescribed diet. RN ordered thickener from pharmacy. Pt is at high risk for poor nutritional intake and dehydration given restrictions of diet.   Labs reviewed: NA: 148, K: 3.3, CBGS: 99-176.   Diet Order:  DIET - DYS 1 Room service appropriate?: Yes; Fluid consistency:: Pudding Thick  Skin:  Reviewed, no issues  Last BM:  10/09/15  Height:   Ht Readings from Last 1 Encounters:  09/28/15 5\' 7"  (1.702 m)    Weight:   Wt Readings from Last 1 Encounters:  10/15/15 127 lb (57.607 kg)    Ideal Body Weight:  67.27 kg  BMI:  Body mass index is 19.89 kg/(m^2).  Estimated Nutritional Needs:   Kcal:  1900-2100  Protein:  90-105 grams  Fluid:  1.9-2.1 L  EDUCATION NEEDS:   No education needs  identified at this time  Bensyn Bornemann A. Mayford Knife, RD, LDN, CDE Pager: [phone] After hours Pager: [phone]

## 2015-10-15 NOTE — Progress Notes (Signed)
Trish Mage, RN Rehab Admission Coordinator Signed Physical Medicine and Rehabilitation PMR Pre-admission 10/15/2015 2:38 PM  Related encounter: ED to Hosp-Admission (Current) from 09/28/2015 in [facility]    Expand All Collapse All   PMR Admission Coordinator Pre-Admission Assessment  Patient: Eddie [name] is an 65 y.o., male MRN: [id number] DOB: 01/22/1951 Height: 5\' 7"  (170.2 cm) Weight: 57.607 kg (127 lb)  Insurance Information HMO: PPO: Yes PCP: IPA: 80/20: OTHER:  PRIMARY: BCBS Policy#: [id number] Subscriber: Ronnell Freshwater CM Name: [name] Phone#: [phone] X [phone] Fax#: [phone] Pre-Cert#: [id number] from 09/2415 to 10/25/15 with update due 10/25/15 if not discharged Employer: [employer] Benefits: Phone #: [phone] Name: On line Eff. 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 male who was admitted on 09/28/15 with chest pain with ST elevation due to anterior STEMI and developed unresponsiveness enroute to ED. He was intubated and treated with brief compressions and epi for PEA. He was taken to cath lab urgently  and DES placed to LAD. 2D echo with EF 10-15%. He was treated with hypothermia protocol and during rewarming was noted to have agitation as well as left sided weakness on 03/13. 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 He developed copious oral secretions due to MSSA HCAP and was started on Vanc/Fortaz for treatment. Repeat echo with EF 30-35% with diffuse hypokinesis and no PFO or thrombus. EEG ordered due to fluctuating MS and showed generalized background slowing due to "pharmacologic, hypoxic, toxic-metabolic or other diffuse physiologic etiology". Treated with ARDS protocol and tolerated extubation on 03/21. Dr. Roda Shutters felt that stroke possibly cardio-embolic. Patient to continue ASA/Brilinta for now and start warfarin/plavix in about a month. Acute systolic failure treated with Ace, coreg and aldactone--question LiveVest depending on cognitive recovery per Dr. Shirlee Latch. Swallow evaluation done today and NPO recommended due to aphonia, stridorous inhalations and evidence of severe dysphagia. PT evaluation done today and patient with resultant Left sided weakness, poor posture and cognitive deficits. CIR recommended for follow up therapy.   Note: Patient fell from bed last evening. He had a sitter at the bedside this am. His bed was lowered and mats were in place to prevent further fall or potential injury. He was started on a diet today of Dys1, pudding thick liquids.   Past Medical History  Past Medical History  Diagnosis Date  . PUD (peptic ulcer disease)     Family History  family history is not on file.  Prior Rehab/Hospitalizations: No previous rehab admissions  Has the patient had major surgery during 100 days prior to admission? No  Current Medications   Current facility-administered medications:  . 0.9 % sodium chloride  infusion, , Intravenous, Continuous, Runell Gess, MD, Last Rate: 10 mL/hr at 10/14/15 0700 . 0.9 % sodium chloride infusion, 250 mL, Intravenous,  PRN, Lyn Records, MD, Last Rate: 10 mL/hr at 10/12/15 0700, 250 mL at 10/12/15 0700 . acetaminophen (TYLENOL) tablet 650 mg, 650 mg, Oral, Q4H PRN, Lyn Records, MD . albuterol (PROVENTIL) (2.5 MG/3ML) 0.083% nebulizer solution 2.5 mg, 2.5 mg, Nebulization, Q3H PRN, Rahul P Desai, PA-C, 2.5 mg at 10/12/15 1540 . antiseptic oral rinse (CPC / CETYLPYRIDINIUM CHLORIDE 0.05%) solution 7 mL, 7 mL, Mouth Rinse, q12n4p, Alyson Reedy, MD, 7 mL at 10/15/15 1200 . aspirin chewable tablet 81 mg, 81 mg, Per Tube, Daily, Rahul P Desai, PA-C, 81 mg at 10/15/15 0927 . atorvastatin (LIPITOR) tablet 80 mg, 80 mg, Oral, q1800, Laurey Morale, MD, 80 mg at 10/13/15 1743 . carvedilol (COREG) tablet 6.25 mg, 6.25 mg, Per Tube, BID WC, Laurey Morale, MD, 6.25 mg at 10/15/15 1610 . ceFAZolin (ANCEF) IVPB 2g/100 mL premix, 2 g, Intravenous, 3 times per day, Runell Gess, MD, 2 g at 10/15/15 1428 . chlorhexidine (PERIDEX) 0.12 % solution 15 mL, 15 mL, Mouth Rinse, BID, Alyson Reedy, MD, 15 mL at 10/15/15 1000 . heparin injection 5,000 Units, 5,000 Units, Subcutaneous, 3 times per day, Laurey Morale, MD, 5,000 Units at 10/15/15 1349 . Influenza vac split quadrivalent PF (FLUARIX) injection 0.5 mL, 0.5 mL, Intramuscular, Prior to discharge, Runell Gess, MD . insulin aspart (novoLOG) injection 0-20 Units, 0-20 Units, Subcutaneous, 6 times per day, Alyson Reedy, MD, 4 Units at 10/15/15 1220 . insulin glargine (LANTUS) injection 10 Units, 10 Units, Subcutaneous, QHS, Coralyn Helling, MD, 10 Units at 10/13/15 2350 . lisinopril (PRINIVIL,ZESTRIL) tablet 2.5 mg, 2.5 mg, Oral, Daily, Laurey Morale, MD, 2.5 mg at 10/15/15 1000 . LORazepam (ATIVAN) tablet 1 mg, 1 mg, Oral, Q4H PRN, Lenon Oms, MD, 1 mg at 10/15/15 1210 . nitroGLYCERIN 50 mg in  dextrose 5 % 250 mL (0.2 mg/mL) infusion, 10 mcg/min, Intravenous, Continuous, Lyn Records, MD, 10 mcg/min at 10/08/15 1143 . ondansetron (ZOFRAN) injection 4 mg, 4 mg, Intravenous, Q6H PRN, Runell Gess, MD . pantoprazole sodium (PROTONIX) 40 mg/20 mL oral suspension 40 mg, 40 mg, Per Tube, Daily, Marquita Palms, RPH, 40 mg at 10/15/15 1139 . RESOURCE THICKENUP CLEAR, , Oral, PRN, Runell Gess, MD . sennosides (SENOKOT) 8.8 MG/5ML syrup 5 mL, 5 mL, Oral, BID, Jose Alexis Frock, MD, 5 mL at 10/15/15 [phone] . sodium chloride flush (NS) 0.9 % injection 3 mL, 3 mL, Intravenous, Q12H, Lyn Records, MD, 3 mL at 10/15/15 1222 . sodium chloride flush (NS) 0.9 % injection 3 mL, 3 mL, Intravenous, PRN, Lyn Records, MD . spironolactone (ALDACTONE) tablet 12.5 mg, 12.5 mg, Oral, Daily, Luis Abed, MD, 12.5 mg at 10/15/15 1000 . ticagrelor (BRILINTA) tablet 90 mg, 90 mg, Oral, BID, Runell Gess, MD, 90 mg at 10/15/15 5409  Patients Current Diet: DIET - DYS 1 Room service appropriate?: Yes; Fluid consistency:: Pudding Thick  Precautions / Restrictions Precautions Precautions: Fall Restrictions Weight Bearing Restrictions: No   Has the patient had 2 or more falls or a fall with injury in the past year?No  Prior Activity Level Community (5-7x/wk): Went out daily. 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  Home Assistive Devices / Equipment Home Assistive Devices/Equipment: None Home Equipment: None  Prior Device Use: Indicate devices/aids used by the patient prior to current illness, exacerbation or injury? None  Prior Functional Level Prior Function Level of Independence: Independent  Self Care:  Did the patient need help bathing, dressing, using the toilet or eating? Independent  Indoor Mobility: Did the patient need assistance with walking from room to room (with or without device)? Independent  Stairs: Did the patient need assistance with internal  or external stairs (with or without device)? Independent  Functional Cognition: Did the patient need help planning regular tasks such as shopping or remembering to take medications? Independent  Current Functional Level Cognition  Overall Cognitive Status: Impaired/Different from baseline Current Attention Level: Selective Orientation Level: Oriented to place, Oriented to person, Disoriented to time, Disoriented to situation Following Commands: Follows multi-step commands with increased time Safety/Judgement: Decreased awareness of safety, Decreased awareness of deficits General Comments: Pt able to verbalize stroke and deficits on L side. pt recalled someone visiting yesterday about therapy but describes it as "outpatient" pt with no recall of fall in PM hours ( or is choosing not to report it when challenged with questioning) pt demonstrates poor recall during balanace / basic transfer in hallway with room number. pt educated room [id number]. pt states it back to therapist. pt reading next room number in hall. Pt asked is this your room. pt states "yes " and used visual cues of unfamiliar visitors in room and family verbalized "no we dont know him" to decide not to enter the room. Pt completed this same sequence at the next room. Pt choose the third room as his room because no one was present in the room and guessing.    Extremity Assessment (includes Sensation/Coordination)  Upper Extremity Assessment: LUE deficits/detail LUE Deficits / Details: 3 out 5 MMT present and able to hold against gravity. pt able to snap, clap and show two fingers.   Lower Extremity Assessment: Defer to PT evaluation LLE Deficits / Details: LLE weakness noted, 2+/5 gross motions, unable to complete full range  LLE Coordination: decreased fine motor, decreased gross motor    ADLs  Overall ADL's : Needs assistance/impaired    Mobility  Overal bed mobility: Needs Assistance Bed Mobility: Supine to  Sit Supine to sit: Min guard Sit to supine: Mod assist General bed mobility comments: pt able to exit bed on L side with cues to complete task. pt able to static sit EOB without (A)     Transfers  Overall transfer level: Needs assistance Equipment used: 2 person hand held assist Transfers: Sit to/from Stand Sit to Stand: +2 physical assistance, Min assist Stand pivot transfers: +2 physical assistance, Mod assist General transfer comment: Pt requires (A) for balance immediately upon standing. Pt with posterior L lean initially. pt with weight shift task to help with awareness in space.    Ambulation / Gait / Stairs / Wheelchair Mobility  Ambulation/Gait Ambulation/Gait assistance: +2 physical assistance, Mod assist Ambulation Distance (Feet): 50 Feet Assistive device: 2 person hand held assist (wrap around support with gait belt) Gait Pattern/deviations: Step-to pattern, Decreased stride length, Ataxic, Trunk flexed, Staggering left, Staggering right, Narrow base of support, Scissoring General Gait Details: patient with poor coordination and difficulty sequencing steps. Max facilitation for weight shifts and tactile as well as visual cues for placement of LLE during loading response Gait velocity: decreased    Posture / Balance Dynamic Sitting Balance Sitting balance - Comments: patient able to self support in sitting with no physical assist required to maintain midline today Balance Overall balance assessment: Needs assistance Sitting-balance support: Bilateral upper extremity supported Sitting balance-Leahy Scale: Fair Sitting balance - Comments: patient able to self support in sitting with no physical  assist required to maintain midline today Postural control: Left lateral lean Standing balance support: During functional activity Standing balance-Leahy Scale: Poor High Level Balance Comments: Pt provided visual input of color contrrast of tile during transfer brace to help  with widen base of support . pt with L LE scissor gait and adducting to midline. pt required tactile input for shoulder widen apart LE base of support. Pt able to use visual feedback to correct base of support. Pt needed total +2 facilitation for weight shift to decr scissor gait pattern with cues for neck extension.    Special needs/care consideration BiPAP/CPAP No CPM No Continuous Drip IV KVO Dialysis No  Life Vest No Oxygen No Special Bed No Trach Size No Wound Vac (area) No  Skin No  Bowel mgmt: Last BM 10/14/15 Bladder mgmt: Incontinence Diabetic mgmt No    Previous Home Environment Living Arrangements: Spouse/significant other Available Help at Discharge: Family Type of Home: House Home Access: Stairs to enter Secretary/administrator of Steps: 1 Home Care Services: No Additional Comments: works at [employer] with wife   Discharge Living Setting Plans for Discharge Living Setting: House, Lives with (comment) Type of Home at Discharge: House Discharge Home Layout: One level Discharge Home Access: Stairs to enter Entrance Stairs-Number of Steps: 1 Does the patient have any problems obtaining your medications?: No  Social/Family/Support Systems Patient Roles: Spouse (Has a wife.) Contact Information: Vrishank Moster - spouse Anticipated Caregiver: wife Anticipated Caregiver's Contact Information: [name] - wife [phone] Ability/Limitations of Caregiver: Wife works 3 days a week, Sun, Mon, Tues 10 am to 9 pm Caregiver Availability: Other (Comment) (Wife not sure if she can stay with patient, she was to work.) Discharge Plan Discussed with Primary Caregiver: Yes Is Caregiver In Agreement with Plan?: Yes Does Caregiver/Family have Issues with Lodging/Transportation while Pt is in Rehab?: No  Goals/Additional Needs Patient/Family Goal for Rehab: PT/OT supervision and min assist, ST mod I and supervision goals Expected length of stay: 15-19  days Cultural Considerations: None Dietary Needs: Dys1, pudding thick liquids Equipment Needs: TBD Pt/Family Agrees to Admission and willing to participate: Yes Program Orientation Provided & Reviewed with Pt/Caregiver Including Roles & Responsibilities: Yes  Decrease burden of Care through IP rehab admission: N/A  Possible need for SNF placement upon discharge: Yes, if patient does not progress to where wife can manage at home or if wife cannot take time off to stay with patient at discharge  Patient Condition: This patient's condition remains as documented in the consult dated 10/13/15, in which the Rehabilitation Physician determined and documented that the patient's condition is appropriate for intensive rehabilitative care in an inpatient rehabilitation facility pending caregiver support. Wife works 3 days a week. Wife may be able to take short amount of time off to stay with patient. May need SNF at the end of the rehab stay. These areas have been addressed. I discussed the above with Dr. Riley Kill and he is agreeable to inpatient rehab admission. Will admit to inpatient rehab today.  Preadmission Screen Completed By: Trish Mage, 10/15/2015 3:27 PM ______________________________________________________________________  Discussed status with Dr. Riley Kill on 10/15/15 at 1501 and received telephone approval for admission today.  Admission Coordinator: Trish Mage, time1527/Date03/24/17          Cosigned by: Ranelle Oyster, MD at 10/15/2015 3:48 PM  Revision History     Date/Time User Provider Type Action   10/15/2015 3:48 PM Ranelle Oyster, MD Physician Cosign   10/15/2015 3:28 PM Ellouise Newer  Koleen Distance, RN Rehab Admission Coordinator Sign   10/15/2015 3:20 PM Trish Mage, RN Rehab Admission Coordinator Sign   View Details Report

## 2015-10-15 NOTE — Progress Notes (Signed)
Cortrack tube checked and when flushed fluid  dripping from the nose. Another RN checked on it with same problem. cortack team paged coming to check on it. Awaiting for their coming. Continue to monitor.

## 2015-10-15 NOTE — Progress Notes (Signed)
Rehab admissions - I spoke with wife this am.  She tells me that she works 3 days a week and patient works 3 days a week.  Wife needs to continue to work.  Notes patient fall and tube feed issue.  Wife tells me that patient has [employer].  I have notified insurance verification team.  I will open a case with [employer] requesting acute inpatient rehab admission.  I will update all once I hear back from insurance case manager.  Call me for questions.  #[phone]

## 2015-10-15 NOTE — Interval H&P Note (Signed)
Eddie [name] was admitted today to Inpatient Rehabilitation with the diagnosis of right frontal infarct/encephalopathy.  The patient's history has been reviewed, patient examined, and there is no change in status.  Patient continues to be appropriate for intensive inpatient rehabilitation.  I have reviewed the patient's chart and labs.  Questions were answered to the patient's satisfaction. The PAPE has been reviewed and assessment remains appropriate.  Eddie [name] T 10/15/2015, 8:11 PM

## 2015-10-15 NOTE — H&P (Signed)
Physical Medicine and Rehabilitation Admission H&P    CC:  Left sided weakness, dysphagia and balance deficits   HPI: Eddie [name] is a 65 y.o. male who was admitted on 09/28/15 with chest pain with ST elevation due to anterior STEMI and developed unresponsiveness enroute to ED. He was intubated and treated with brief compressions and epi for PEA. He was taken to cath lab urgently and DES placed to LAD. 2D echo with EF 10-15%. He was treated with hypothermia protocol and during rewarming was noted to have agitation as well as left sided weakness on 03/13. 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 He developed copious oral secretions due to MSSA HCAP and was started on Vanc/Fortaz for treatment. Repeat echo with EF 30-35% with diffuse hypokinesis and no PFO or thrombus. EEG ordered due to fluctuating MS and showed generalized background slowing due to "pharmacologic, hypoxic, toxic-metabolic or other diffuse physiologic etiology". Treated with ARDS protocol and tolerated extubation on 03/21.   Dr. Erlinda Hong felt that stroke possibly cardio-embolic. Patient to continue ASA/Brilinta for now and start warfarin/plavix in about a month.  Acute systolic failure treated with Ace, coreg and aldactone--question LiveVest depending on cognitive recovery per Dr. Aundra Dubin.  MBS done and patient started on dysphagia one pudding liquids. He sustained a fall last pm and follow up CT head with evolving infarct. Patient with resultant Left sided weakness, poor posture and cognitive deficits. CIR recommended for follow up therapy.    Review of Systems  HENT: Negative for hearing loss.   Eyes: Negative for blurred vision and double vision.  Respiratory: Negative for cough and shortness of breath.   Cardiovascular: Negative for chest pain and palpitations.    Gastrointestinal: Positive for abdominal pain. Negative for heartburn and nausea.  Genitourinary: Negative for dysuria and urgency.  Musculoskeletal: Negative for myalgias and neck pain.  Skin: Positive for rash.  Neurological: Positive for speech change and weakness. Negative for dizziness and headaches.  Psychiatric/Behavioral: Positive for memory loss. Negative for depression. The patient is not nervous/anxious and does not have insomnia.       Past Medical History  Diagnosis Date  . PUD (peptic ulcer disease)      Past Surgical History  Procedure Laterality Date  . Repair of peptic ulcer    . Cardiac catheterization N/A 09/28/2015    Procedure: Left Heart Cath and Coronary Angiography;  Surgeon: Lorretta Harp, MD;  Location: [facility];  Service: Cardiovascular;  Laterality: N/A;  . Cardiac catheterization N/A 10/08/2015    Procedure: Left Heart Cath and Coronary Angiography;  Surgeon: Belva Crome, MD;  Location: [facility];  Service: Cardiovascular;  Laterality: N/A;    History reviewed. No pertinent family history.    Social History:  Married. 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 Marland Kitchen He does not have any smokeless tobacco history on file. He denies s alcohol or drug use.    Allergies: No Known Allergies    Medications Prior to Admission  Medication Sig Dispense Refill  . Famotidine (PEPCID AC PO) Take 1 tablet by mouth 2 (two) times daily.      Home: Home Living Family/patient expects to be discharged to:: Private residence  Living Arrangements: Spouse/significant other Available Help at Discharge: Family Type of Home: House Home Access: Stairs to enter Technical brewer of Steps: 1 Home Equipment: None Additional Comments: works at [employer] with wife    Functional History: Prior Function Level of Independence:  Independent  Functional Status:  Mobility: Bed Mobility Overal bed mobility: Needs Assistance Bed Mobility: Supine to Sit Supine to sit: Min guard Sit to supine: Mod assist General bed mobility comments: pt able to exit bed on L side with cues to complete task. pt able to static sit EOB without (A)  Transfers Overall transfer level: Needs assistance Equipment used: 2 person hand held assist Transfers: Sit to/from Stand Sit to Stand: +2 physical assistance, Min assist Stand pivot transfers: +2 physical assistance, Mod assist General transfer comment: Pt requires (A) for balance immediately upon standing. Pt with posterior L lean initially. pt with weight shift task to help with awareness in space. Ambulation/Gait Ambulation/Gait assistance: +2 physical assistance, Mod assist Ambulation Distance (Feet): 50 Feet Assistive device: 2 person hand held assist (wrap around support with gait belt) Gait Pattern/deviations: Step-to pattern, Decreased stride length, Ataxic, Trunk flexed, Staggering left, Staggering right, Narrow base of support, Scissoring General Gait Details: patient with poor coordination and difficulty sequencing steps. Max facilitation for weight shifts and tactile as well as visual cues for placement of LLE during loading response Gait velocity: decreased    ADL: ADL Overall ADL's : Needs assistance/impaired  Cognition: Cognition Overall Cognitive Status: Impaired/Different from baseline Orientation Level: Oriented to place, Oriented to person, Disoriented to time, Disoriented to situation Cognition Arousal/Alertness: Awake/alert Behavior During Therapy: WFL for tasks assessed/performed Overall Cognitive Status: Impaired/Different from baseline Area of Impairment: Orientation, Attention, Memory, Following commands, Safety/judgement, Problem solving, Awareness Orientation Level: Disoriented to, Time Current Attention Level: Selective Memory: Decreased short-term  memory Following Commands: Follows multi-step commands with increased time Safety/Judgement: Decreased awareness of safety, Decreased awareness of deficits Awareness: Intellectual Problem Solving: Slow processing, Difficulty sequencing General Comments: Pt able to verbalize stroke and deficits on L side. pt recalled someone visiting yesterday about therapy but describes it as "outpatient" pt with no recall of fall in PM hours ( or is choosing not to report it when challenged with questioning) pt demonstrates poor recall during balanace / basic transfer in hallway with room number. pt educated room [id number]. pt states it back to therapist. pt reading next room number in hall. Pt asked is this your room. pt states "yes " and used visual cues of unfamiliar visitors in room and family verbalized "no we dont know him" to decide not to enter the room. Pt completed this same sequence at the next room. Pt choose the third room as his room because no one was present in the room and guessing.    Blood pressure 132/88, pulse 91, temperature 97.9 F (36.6 C), temperature source Oral, resp. rate 20, height '5\' 7"'$  (1.702 m), weight 57.607 kg (127 lb), SpO2 98 %. Physical Exam  Nursing note and vitals reviewed. Constitutional: He is oriented to person, place, and time. He appears well-developed and well-nourished.  HENT:  Head: Normocephalic and atraumatic.  Mouth/Throat: Oropharynx is clear and moist. Abnormal dentition.  NGT in place, poor dentition. Only 4 remaining teeth which are decayed  Eyes: Conjunctivae are normal. Pupils are equal, round, and reactive to light.  Neck: Normal range of motion. Neck supple.  Cardiovascular: Normal rate and regular rhythm.   No murmur heard. Respiratory: Effort normal and breath sounds normal. No respiratory distress.  He has no wheezes.  Occasional stridor heard.   GI: Soft. Bowel sounds are normal. He exhibits no distension. There is no tenderness.  Genitourinary:   Condom cath in place.   Musculoskeletal: He exhibits no edema or tenderness.  Neurological: He is alert and oriented to person, place, and time.  Kneeling forward in the middle of bed. Turned around and laid on back when I cued him. Oriented to [place] simple commands. redirectible Hoarse voice. Motor 4/5 rightl upper ext and lower exts. Left upper/lower grossly 3 to 3+.  Senses pain and light touch in all 4's. Fair oro-motor control. Speech slightly dysarthric  DTRs symmetric   Skin: Skin is warm and dry.  Psychiatric: His affect is blunt. His speech is delayed. He is slowed.    Results for orders placed or performed during the hospital encounter of 09/28/15 (from the past 48 hour(s))  Glucose, capillary     Status: Abnormal   Collection Time: 10/13/15  4:06 PM  Result Value Ref Range   Glucose-Capillary 113 (H) 65 - 99 mg/dL   Comment 1 Capillary Specimen   Glucose, capillary     Status: Abnormal   Collection Time: 10/13/15  8:05 PM  Result Value Ref Range   Glucose-Capillary 173 (H) 65 - 99 mg/dL   Comment 1 Capillary Specimen   Glucose, capillary     Status: Abnormal   Collection Time: 10/13/15 11:54 PM  Result Value Ref Range   Glucose-Capillary 176 (H) 65 - 99 mg/dL   Comment 1 Capillary Specimen   Renal function panel     Status: Abnormal   Collection Time: 10/14/15  3:08 AM  Result Value Ref Range   Sodium 145 135 - 145 mmol/L   Potassium 3.8 3.5 - 5.1 mmol/L   Chloride 105 101 - 111 mmol/L   CO2 27 22 - 32 mmol/L   Glucose, Bld 163 (H) 65 - 99 mg/dL   BUN 37 (H) 6 - 20 mg/dL   Creatinine, Ser 1.03 0.61 - 1.24 mg/dL   Calcium 9.2 8.9 - 10.3 mg/dL   Phosphorus 3.2 2.5 - 4.6 mg/dL   Albumin 2.9 (L) 3.5 - 5.0 g/dL   GFR calc non Af Amer >60 >60 mL/min   GFR calc Af Amer >60 >60 mL/min    Comment: (NOTE) The eGFR has been calculated using the CKD EPI equation. This calculation has not been validated in all clinical situations. eGFR's  persistently <60 mL/min signify possible Chronic Kidney Disease.    Anion gap 13 5 - 15  CBC     Status: Abnormal   Collection Time: 10/14/15  3:08 AM  Result Value Ref Range   WBC 17.8 (H) 4.0 - 10.5 K/uL   RBC 3.87 (L) 4.22 - 5.81 MIL/uL   Hemoglobin 9.4 (L) 13.0 - 17.0 g/dL   HCT 31.4 (L) 39.0 - 52.0 %   MCV 81.1 78.0 - 100.0 fL   MCH 24.3 (L) 26.0 - 34.0 pg   MCHC 29.9 (L) 30.0 - 36.0 g/dL   RDW 14.9 11.5 - 15.5 %   Platelets 811 (H) 150 - 400 K/uL  Magnesium     Status: Abnormal   Collection Time: 10/14/15  3:08 AM  Result Value Ref Range   Magnesium 2.7 (H) 1.7 - 2.4 mg/dL  Glucose, capillary     Status: Abnormal   Collection Time: 10/14/15  3:15 AM  Result Value Ref Range   Glucose-Capillary 160 (H) 65 - 99 mg/dL  Comment 1 Capillary Specimen   Glucose, capillary     Status: Abnormal   Collection Time: 10/14/15  8:16 AM  Result Value Ref Range   Glucose-Capillary 141 (H) 65 - 99 mg/dL  Glucose, capillary     Status: Abnormal   Collection Time: 10/14/15 11:51 AM  Result Value Ref Range   Glucose-Capillary 156 (H) 65 - 99 mg/dL   Comment 1 Venous Specimen   Glucose, capillary     Status: None   Collection Time: 10/14/15  5:12 PM  Result Value Ref Range   Glucose-Capillary 88 65 - 99 mg/dL   Comment 1 Capillary Specimen    Comment 2 Notify RN   Glucose, capillary     Status: Abnormal   Collection Time: 10/14/15  8:58 PM  Result Value Ref Range   Glucose-Capillary 134 (H) 65 - 99 mg/dL   Comment 1 Capillary Specimen   Glucose, capillary     Status: None   Collection Time: 10/15/15 12:43 AM  Result Value Ref Range   Glucose-Capillary 93 65 - 99 mg/dL  Renal function panel     Status: Abnormal   Collection Time: 10/15/15  3:30 AM  Result Value Ref Range   Sodium 148 (H) 135 - 145 mmol/L   Potassium 3.3 (L) 3.5 - 5.1 mmol/L   Chloride 106 101 - 111 mmol/L   CO2 28 22 - 32 mmol/L   Glucose, Bld 95 65 - 99 mg/dL   BUN 41 (H) 6 - 20 mg/dL   Creatinine, Ser 1.10  0.61 - 1.24 mg/dL   Calcium 9.1 8.9 - 10.3 mg/dL   Phosphorus 3.8 2.5 - 4.6 mg/dL   Albumin 3.1 (L) 3.5 - 5.0 g/dL   GFR calc non Af Amer >60 >60 mL/min   GFR calc Af Amer >60 >60 mL/min    Comment: (NOTE) The eGFR has been calculated using the CKD EPI equation. This calculation has not been validated in all clinical situations. eGFR's persistently <60 mL/min signify possible Chronic Kidney Disease.    Anion gap 14 5 - 15  Glucose, capillary     Status: None   Collection Time: 10/15/15  4:51 AM  Result Value Ref Range   Glucose-Capillary 99 65 - 99 mg/dL  Glucose, capillary     Status: Abnormal   Collection Time: 10/15/15  8:00 AM  Result Value Ref Range   Glucose-Capillary 127 (H) 65 - 99 mg/dL   Comment 1 Document in Chart   Glucose, capillary     Status: Abnormal   Collection Time: 10/15/15 12:16 PM  Result Value Ref Range   Glucose-Capillary 176 (H) 65 - 99 mg/dL   Comment 1 Document in Chart    Ct Head Wo Contrast  10/14/2015  CLINICAL DATA:  Fall.  Initial encounter. EXAM: CT HEAD WITHOUT CONTRAST TECHNIQUE: Contiguous axial images were obtained from the base of the skull through the vertex without intravenous contrast. COMPARISON:  Head CTA 10/05/2015 and MRI 10/04/2015 FINDINGS: A small focus of hypoattenuation is again seen in the the posterior right frontal white matter/ centrum semiovale corresponding to the infarct demonstrated earlier this month. There is no evidence of new infarct, intracranial hemorrhage, mass, midline shift, or extra-axial fluid collection. Ventricles and sulci are within normal limits for age. Postoperative changes to the right globe. Mild mucosal thickening in the sphenoid sinuses. Trace left mastoid effusion. Mild calcified atherosclerosis at the skullbase. No skull fracture identified. IMPRESSION: 1. Evolving, small, subacute posterior right frontal infarct. 2. No  evidence of new intracranial abnormality. Electronically Signed   By: Logan Bores M.D.    On: 10/14/2015 23:50   Dg Abd Portable 1v  10/14/2015  CLINICAL DATA:  Check feeding catheter placement EXAM: PORTABLE ABDOMEN - 1 VIEW COMPARISON:  10/13/2015 FINDINGS: The nasogastric catheter has been exchanged for feeding catheter which now lies at the level of the lower channel and possibly in the proximal duodenum. Scattered large and small bowel gas is again identified. No obstructive changes are seen. No acute bony abnormality is seen. IMPRESSION: Feeding catheter within the distal stomach Electronically Signed   By: Inez Catalina M.D.   On: 10/14/2015 14:32     Medical Problem List and Plan: 1.  Cognitive, mobility, and functional deficits secondary to anoxic encephalopathy and right frontal infarct 2.  DVT Prophylaxis/Anticoagulation: Pharmaceutical: Lovenox 3. Pain Management: tylenol prn 4. Mood: LCSW to follow for evaluation and support.  5. Neuropsych: This patient is not fully capable of making decisions on his own behalf. 6. Skin/Wound Care: Routine pressure relief measure.  7. Fluids/Electrolytes/Nutrition: Monitor I/O. Check lytes in am.  8. Anterior STEMI: to continue ASA, Brilinta, BB and statin. 9. Acute systolic heart failure: Has been compensated. Check daily weights and monitor for signs of overload.  Continue aldactone, coreg and lisinopril.  10. 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 Neurology recommends coumadin plus plavix in one month.  11. MSSA PNA: IV ancef discontinued today--has completed 12 day antibiotic regimen.   -pt is an aspiration risk--observe aspiration precautions 12. Acute on chronic renal failure: Diuresis discontinued with improvement. Continue to monitor as on modified diet without liquids.  May need IVF at nights to maintain adequate hydration.  13. Anemia: Slowly improving. Continue to monitor for signs of infection. Check CBC in am.  14. DM type 2: New diagnosis with Hgb A1C-6.9. BS uncontrolled due to tube feeds. Continue lantus daily and will transition  to oral medication as intake improves.  Monitor BS with ac/hs checks and use SSI for elevated BS.     Post Admission Physician Evaluation: 1. Functional deficits secondary  to right frontal infarct and anoxic encephalopathy. 2. Patient is admitted to receive collaborative, interdisciplinary care between the physiatrist, rehab nursing staff, and therapy team. 3. Patient's level of medical complexity and substantial therapy needs in context of that medical necessity cannot be provided at a lesser intensity of care such as a SNF. 4. Patient has experienced substantial functional loss from his/her baseline which was documented above under the "Functional History" and "Functional Status" headings.  Judging by the patient's diagnosis, physical exam, and functional history, the patient has potential for functional progress which will result in measurable gains while on inpatient rehab.  These gains will be of substantial and practical use upon discharge  in facilitating mobility and self-care at the household level. 5. Physiatrist will provide 24 hour management of medical needs as well as oversight of the therapy plan/treatment and provide guidance as appropriate regarding the interaction of the two. 6. 24 hour rehab nursing will assist with bladder management, bowel management, safety, skin/wound care, disease management, medication administration, pain management and patient education  and help integrate therapy concepts, techniques,education, etc. 7. PT will assess and treat for/with: Lower extremity strength, range of motion, stamina, balance, functional mobility, safety, adaptive techniques and equipment, NMR, cognitive-behavioral mgt, family education, community reintegration.   Goals are: supervision. 8. OT will assess and treat for/with: ADL's, functional mobility, safety, upper extremity strength, adaptive techniques and equipment, NMR, cognitive-behavioral mgt, family education,  ego support.   Goals  are: supervision. Therapy may not yet proceed with showering this patient. 9. SLP will assess and treat for/with: cognition, swallowing, communication, behavior.  Goals are: supervision to min assist. 10. Case Management and Social Worker will assess and treat for psychological issues and discharge planning. 11. Team conference will be held weekly to assess progress toward goals and to determine barriers to discharge. 12. Patient will receive at least 3 hours of therapy per day at least 5 days per week. 13. ELOS: 13-18 days       14. Prognosis:  excellent     Meredith Staggers, MD, [facility] 10/15/2015   10/15/2015

## 2015-10-15 NOTE — Evaluation (Addendum)
Occupational Therapy Evaluation Patient Details Name: Eddie [name] MRN: [id number] DOB: 05-May-1951 Today's Date: 10/15/2015    History of Present Illness 65 year old male admitted 3/7 chest pain, becoming unresponsive shortely before arrival to ED. Patient with cardiac arrest (brief chest compressions), NSTEMI, intubated. Possible CVA symptoms noted on 3/13. 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 Extubated 3/20   Clinical Impression   PT admitted with MI and now with CVA confirmed by MRI 10/05/15. Pt currently with functional limitiations due to the deficits listed below (see OT problem list). Pta was independent with all adls and working full time at [employer]. Pt demonstrates visual scanning deficits, L side apraxic movement with basic transfer and balance deficits affecting all adls. Pt will benefit from skilled OT to increase their independence and safety with adls and balance to allow discharge CIR. Pt demonstrates ability to tolerate intense CIR and remains excellent candidate.      Follow Up Recommendations  CIR    Equipment Recommendations  Other (comment) (TBA)    Recommendations for Other Services Rehab consult     Precautions / Restrictions Precautions Precautions: Fall Restrictions Weight Bearing Restrictions: No      Mobility Bed Mobility Overal bed mobility: Needs Assistance Bed Mobility: Supine to Sit     Supine to sit: Min guard     General bed mobility comments: pt able to exit bed on L side with cues to complete task. pt able to static sit EOB without (A)   Transfers Overall transfer level: Needs assistance   Transfers: Sit to/from Stand Sit to Stand: +2 physical assistance;Min assist         General transfer comment: Pt requires (A) for balance immediately upon standing. Pt with posterior L lean initially. pt with weight shift task to help with awareness in space.    Balance Overall balance  assessment: Needs assistance Sitting-balance support: Bilateral upper extremity supported;Feet supported Sitting balance-Leahy Scale: Fair     Standing balance support: Bilateral upper extremity supported;During functional activity Standing balance-Leahy Scale: Poor                 High Level Balance Comments: Pt provided visual input of color contrrast of tile during transfer brace to help with widen base of support . pt with L LE scissor gait and adducting to midline. pt required tactile input for shoulder widen apart LE base of support. Pt able to use visual feedback to correct base of support. Pt needed total +2 facilitation for weight shift to decr scissor gait pattern with cues for neck extension.            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  Pt demonstrates poor visual scanning ( see attached document in chart) Pt starts at the center of vision scanning R UQ then down toward R LQ then back toward the center. pt completely neglects L lower quadrant of exercise and uses the same center dot twice showing inability to recognize error. But demonstrates cognitive ability to recall 10 circles and required to locate 10 circles total. Pt demonstrates VISUAL SCANNING deficits.  Pt asked to read sitters name which is "[place]" pt reads it as "Marchelle Folks" but when asked to scan with therapist guiding clearly reads each letter and corrects error. Pt provided a visual scanning task that organized visual scanning and able to complete 100% correct. Pt could greatly benefit from continued intense rehab to help learn compensatory strategies and seqence for vision   Perception Perception Perception Tested?: Yes Perception Deficits: Inattention/neglect Inattention/Neglect: Impaired- to be further tested in functional context Comments: inattention to L lower visional field   Praxis      Pertinent Vitals/Pain Pain Assessment: No/denies pain     Hand Dominance Right   Extremity/Trunk Assessment Upper Extremity Assessment Upper Extremity Assessment: LUE deficits/detail LUE Deficits / Details: 3 out 5 MMT present and able to hold against gravity. pt able to snap, clap and show two fingers.    Lower Extremity Assessment Lower Extremity Assessment: Defer to PT evaluation   Cervical / Trunk Assessment Cervical / Trunk Assessment: Normal   Communication Communication Communication: No difficulties   Cognition Arousal/Alertness: Awake/alert Behavior During Therapy: WFL for tasks assessed/performed Overall Cognitive Status: Impaired/Different from baseline Area of Impairment: Orientation;Attention;Memory;Following commands;Safety/judgement;Problem solving;Awareness Orientation Level: Disoriented to;Time Current Attention Level: Selective Memory: Decreased short-term memory Following Commands: Follows multi-step commands with increased time Safety/Judgement: Decreased awareness of safety;Decreased awareness of deficits Awareness: Intellectual Problem Solving: Slow processing;Difficulty sequencing General Comments: Pt able to verbalize stroke and deficits on L side. pt recalled someone visiting yesterday about therapy but describes it as "outpatient" pt with no recall of fall  in PM hours ( or is choosing not to report it when challenged with questioning) pt demonstrates poor recall during balanace / basic transfer in hallway with room number. pt educated room [id number]. pt states it back to therapist. pt reading next room number in hall. Pt asked is this your room. pt states "yes " and used visual cues of unfamiliar visitors in room and family verbalized "no we dont know him" to decide not to enter the room. Pt completed this same sequence at the next room. Pt choose the third room as his room because no one was present in the room and guessing.    General Comments       Exercises   Other Exercises Other Exercises: pt able to raise R hand and lift R knee in flexion/ hip flexion with L LE activated and static single leg standing with total +2 (A). pt demonstrates ability to activate gluteus and quad (no hyperexention noted) Pt when asked to step initiates with R LE due to L LE impairments.    Shoulder Instructions      Home Living Family/patient expects to be discharged to:: Private residence Living Arrangements: Spouse/significant other Available Help at Discharge: Family Type of Home: House Home Access: Stairs to enter Secretary/administrator of Steps: 1                   Home Equipment: None   Additional Comments: works at [employer] with wife       Prior Functioning/Environment Level of Independence: Independent  OT Diagnosis: Generalized weakness;Cognitive deficits;Disturbance of vision;Apraxia (Lside affected)   OT Problem List: Decreased range of motion;Decreased activity tolerance;Impaired balance (sitting and/or standing);Impaired vision/perception;Decreased cognition;Decreased coordination;Decreased safety awareness;Decreased knowledge of use of DME or AE;Decreased knowledge of precautions;Cardiopulmonary status limiting activity   OT Treatment/Interventions: Self-care/ADL training;Therapeutic exercise;Neuromuscular education;DME  and/or AE instruction;Therapeutic activities;Cognitive remediation/compensation;Visual/perceptual remediation/compensation;Patient/family education;Balance training    OT Goals(Current goals can be found in the care plan section) Acute Rehab OT Goals Patient Stated Goal: none stated at this time OT Goal Formulation: With patient Time For Goal Achievement: 10/29/15 Potential to Achieve Goals: Good ADL Goals Pt Will Perform Grooming: with min guard assist;sitting Pt Will Perform Upper Body Bathing: with min guard assist;sitting Pt Will Perform Lower Body Bathing: with min assist;sit to/from stand Pt Will Transfer to Toilet: with min assist;ambulating;bedside commode Additional ADL Goal #1: Pt will complete visual scanning task with less than 2 errors ( allowed to self correct up to 2 errors) Additional ADL Goal #2: Pt will demonstrate ability to sequence 3 step adl task   OT Frequency: Min 2X/week   Barriers to D/C:    CIR coordinator Genie to speak with wife "[name]" regarding d/c planning per notes and after speaking this AM        Co-evaluation PT/OT/SLP Co-Evaluation/Treatment: Yes Reason for Co-Treatment: Complexity of the patient's impairments (multi-system involvement);For patient/therapist safety   OT goals addressed during session: ADL's and self-care;Strengthening/ROM      End of Session Equipment Utilized During Treatment: Gait belt Nurse Communication: Mobility status;Precautions  Activity Tolerance: Patient tolerated treatment well Patient left: in chair;with call bell/phone within reach;with chair alarm set;with nursing/sitter in room   Time: 0819-0850 OT Time Calculation (min): 31 min Charges:  OT General Charges $OT Visit: 1 Procedure OT Evaluation $OT Eval High Complexity: 1 Procedure G-Codes:    Harolyn Rutherford 11/07/2015, 10:14 AM  Mateo Flow   OTR/L Pager: [phone] Office: [phone] .

## 2015-10-15 NOTE — Progress Notes (Signed)
Ankit Karis Juba, MD Physician Addendum Physical Medicine and Rehabilitation Consult Note 10/13/2015 11:47 AM  Related encounter: ED to Hosp-Admission (Current) from 09/28/2015 in [facility]    Expand All Collapse All        Physical Medicine and Rehabilitation Consult   Reason for Consult: Left sided weakness, dysphagia and balance deficits Referring Physician: Dr. Allyson Sabal.    HPI: Eddie [name] is a 65 y.o. male who was admitted on 09/28/15 with chest pain with ST elevation due to anterior STEMI and developed unresponsiveness enroute to ED. He was intubated and treated with brief compressions and epi for PEA. He was taken to cath lab urgently and DES placed to LAD. 2D echo with EF 10-15%. He was treated with hypothermia protocol and during rewarming was noted to have agitation as well as left sided weakness on 03/13. 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 He developed copious oral secretions due to MSSA HCAP and was started on Vanc/Fortaz for treatment. Repeat echo with EF 30-35% with diffuse hypokinesis and no PFO or thrombus. EEG ordered due to fluctuating MS and showed generalized background slowing due to "pharmacologic, hypoxic, toxic-metabolic or other diffuse physiologic etiology". Treated with ARDS protocol and tolerated extubation on 03/21. Dr. Roda Shutters felt that stroke possibly cardio-embolic. Patient to continue ASA/Brilinta for now and start warfarin/plavix in about a month. Acute systolic failure treated with Ace, coreg and aldactone--question LiveVest depending on cognitive recovery per Dr. Shirlee Latch. Swallow evaluation done today and NPO recommended due to aphonia, stridorous inhalations and evidence of severe dysphagia. PT evaluation done today and patient with resultant Left sided weakness, poor  posture and cognitive deficits. CIR recommended for follow up therapy.    Review of Systems  HENT: Negative for hearing loss.  Eyes: Positive for blurred vision (right eye for years?).  Respiratory: Positive for shortness of breath. Negative for cough.  Cardiovascular: Negative for chest pain, palpitations and leg swelling.  Gastrointestinal: Negative for heartburn, nausea and abdominal pain.  Genitourinary: Negative for dysuria and urgency.  Musculoskeletal: Negative for myalgias, back pain and joint pain.  Skin: Negative for rash.  Neurological: Positive for speech change and focal weakness. Negative for dizziness, tingling and headaches.  Psychiatric/Behavioral: Positive for memory loss.  All other systems reviewed and are negative.     Past Medical History  Diagnosis Date  . PUD (peptic ulcer disease)   Tobacco abuse  Past Surgical History  Procedure Laterality Date  . Repair of peptic ulcer    . Cardiac catheterization N/A 09/28/2015    Procedure: Left Heart Cath and Coronary Angiography; Surgeon: Runell Gess, MD; Location: [facility] INVASIVE CV LAB; Service: Cardiovascular; Laterality: N/A;  . Cardiac catheterization N/A 10/08/2015    Procedure: Left Heart Cath and Coronary Angiography; Surgeon: Lyn Records, MD; Location: [facility] INVASIVE CV LAB; Service: Cardiovascular; Laterality: N/A;    History reviewed. No pertinent family history.    Social History: Married. 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 and she will not be able to provide 24/7 support at discharge He reports that he has been smoking about 2-3 PPD. Marland Kitchen He does not have any smokeless tobacco history on file. He denies s alcohol or drug use.    Allergies: No Known Allergies    Medications Prior to Admission  Medication Sig Dispense Refill  .  Famotidine (PEPCID AC PO) Take 1 tablet by mouth 2 (two) times daily.      Home: Home Living Family/patient  expects to be discharged to:: Private residence Living Arrangements: Spouse/significant other Available Help at Discharge: Family Type of Home: House Home Access: Stairs to enter Secretary/administrator of Steps: 1 Home Equipment: None  Functional History: Prior Function Level of Independence: Independent Functional Status:  Mobility: Bed Mobility Overal bed mobility: Needs Assistance Bed Mobility: Supine to Sit Supine to sit: Max assist General bed mobility comments: patient was able to initate RLE to EOB, but unable to bring LLE to edge, required increased assist to bring LEs off bed and rotate hips to EOB. Increased assist to power trunk to upright position. Patient with poor ability to maintain trunk control during transition. Transfers Overall transfer level: Needs assistance Equipment used: (2 person face to face with gait belt) Transfers: Sit to/from Stand, Stand Pivot Transfers Sit to Stand: +2 physical assistance, Mod assist Stand pivot transfers: +2 physical assistance, Mod assist General transfer comment: +2 assist for safety, patient able to initiate power up to standing with assist. LLE knee blocked to maintain extension. Patient able to take pivotal steps with RLE to chair. Assist to maintain stability and faciliatation of upright positioning      ADL:    Cognition: Cognition Overall Cognitive Status: Impaired/Different from baseline Orientation Level: Oriented to person, Disoriented to place, Disoriented to time, Disoriented to situation Cognition Arousal/Alertness: Awake/alert Behavior During Therapy: Flat affect Overall Cognitive Status: Impaired/Different from baseline Area of Impairment: Orientation, Attention, Memory, Following commands, Safety/judgement, Awareness, Problem solving Orientation Level: Disoriented to, Time, Situation Current Attention Level: Focused Memory: Decreased short-term memory Following Commands: Follows one step commands  consistently Safety/Judgement: Decreased awareness of safety, Decreased awareness of deficits Awareness: Intellectual Problem Solving: Slow processing, Difficulty sequencing, Requires verbal cues, Requires tactile cues General Comments: Patient with decreased insight and awareness of deficits noted as patient did not believe that he was falling off to the side despite several attempts to cue patient to his inability to sit upright at EOB.   Blood pressure 151/86, pulse 90, temperature 98.5 F (36.9 C), temperature source Oral, resp. rate 22, height 5\' 7"  (1.702 m), weight 61.6 kg (135 lb 12.9 oz), SpO2 100 %. Physical Exam  Nursing note and vitals reviewed. Constitutional: He appears well-developed and well-nourished.  HENT:  Head: Atraumatic.  Mouth/Throat: Oropharynx is clear and moist.  Poor dentition  Eyes: EOM are normal. Pupils are equal, round, and reactive to light.  Neck: Neck supple.  Cardiovascular: Normal rate and regular rhythm.  Respiratory: No stridor. No respiratory distress. He has rhonchi. He exhibits no tenderness.  Weak cough  GI: Soft. Bowel sounds are normal. He exhibits no distension. There is no tenderness.  + NG  Musculoskeletal: He exhibits no edema or tenderness.  Neurological: He is alert.  Oriented to self and place.  Could not recall details of admission or reason for left sided weakness.  He has very poor insight and awareness of deficits. ?expressive deficits with occasional perseverative output. Able to follow simple one step motor commands with cues.  Dysphonia Sensation intact light touch DTRs symmetric Motor: Right upper extremity and right lower x-ray 5/5 proximal distal Left upper extremity/left lower extremity: 2+/5 proximal to distal  Skin: Skin is warm and dry.  Psychiatric: He has a normal mood and affect. His behavior is normal.     Lab Results Last 24 Hours    Results for orders placed  or performed during the hospital encounter  of 09/28/15 (from the past 24 hour(s))  Glucose, capillary Status: Abnormal   Collection Time: 10/12/15 4:54 PM  Result Value Ref Range   Glucose-Capillary 238 (H) 65 - 99 mg/dL  Glucose, capillary Status: Abnormal   Collection Time: 10/12/15 9:07 PM  Result Value Ref Range   Glucose-Capillary 133 (H) 65 - 99 mg/dL   Comment 1 Capillary Specimen    Comment 2 Notify RN   Glucose, capillary Status: Abnormal   Collection Time: 10/13/15 12:06 AM  Result Value Ref Range   Glucose-Capillary 190 (H) 65 - 99 mg/dL   Comment 1 Capillary Specimen    Comment 2 Notify RN   Glucose, capillary Status: Abnormal   Collection Time: 10/13/15 3:20 AM  Result Value Ref Range   Glucose-Capillary 191 (H) 65 - 99 mg/dL   Comment 1 Venous Specimen    Comment 2 Notify RN   Renal function panel Status: Abnormal   Collection Time: 10/13/15 3:41 AM  Result Value Ref Range   Sodium 145 135 - 145 mmol/L   Potassium 3.4 (L) 3.5 - 5.1 mmol/L   Chloride 99 (L) 101 - 111 mmol/L   CO2 32 22 - 32 mmol/L   Glucose, Bld 206 (H) 65 - 99 mg/dL   BUN 40 (H) 6 - 20 mg/dL   Creatinine, Ser 0.98 0.61 - 1.24 mg/dL   Calcium 9.1 8.9 - 11.9 mg/dL   Phosphorus 3.2 2.5 - 4.6 mg/dL   Albumin 2.9 (L) 3.5 - 5.0 g/dL   GFR calc non Af Amer >60 >60 mL/min   GFR calc Af Amer >60 >60 mL/min   Anion gap 14 5 - 15  CBC Status: Abnormal   Collection Time: 10/13/15 3:41 AM  Result Value Ref Range   WBC 19.8 (H) 4.0 - 10.5 K/uL   RBC 3.50 (L) 4.22 - 5.81 MIL/uL   Hemoglobin 8.4 (L) 13.0 - 17.0 g/dL   HCT 14.7 (L) 82.9 - 56.2 %   MCV 81.1 78.0 - 100.0 fL   MCH 24.0 (L) 26.0 - 34.0 pg   MCHC 29.6 (L) 30.0 - 36.0 g/dL   RDW 13.0 86.5 - 78.4 %   Platelets 712 (H) 150 - 400 K/uL  Magnesium Status: None   Collection Time: 10/13/15  3:41 AM  Result Value Ref Range   Magnesium 2.4 1.7 - 2.4 mg/dL      Imaging Results (Last 48 hours)    Dg Chest Port 1 View  10/12/2015 CLINICAL DATA: Shortness of breath, cardiac arrest, acute respiratory failure with intubation. EXAM: PORTABLE CHEST 1 VIEW COMPARISON: Portable chest x-ray of October 11, 2015 FINDINGS: The lungs are well-expanded. The interstitial markings remain increased but have improved somewhat since yesterday's study. The cardiac silhouette is top-normal in size. The central pulmonary vascularity is prominent. Cephalization has decreased. The mediastinum is normal in width. There is mild tortuosity of the descending thoracic aorta. The endotracheal tube tip lies approximately 7 cm above the carina. The esophagogastric tube tip projects below the inferior margin of the image. The left internal jugular venous catheter tip projects at the junction of the right and left brachiocephalic veins. IMPRESSION: Slight further interval improvement in the pulmonary interstitium consistent with resolving CHF. There is no alveolar pneumonia. The support tubes are in stable position. The endotracheal tube should be advanced approximately 3 cm as recommended on yesterday's study. Electronically Signed By: David Swaziland M.D. 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 Records reviewed and summated above. Stroke: Continue secondary stroke prophylaxis and Risk Factor Modification listed below:  Antiplatelet therapy:  Blood Pressure Management: Continue current medication with prn's with permisive HTN per primary team Statin Agent:  Diabetes management:  Tobacco abuse: Continue to counsel Left sided hemiparesis: fit for orthotics to prevent contractures (resting hand splint for day, wrist cock up splint at night, PRAFO, etc) Motor recovery: Fluoxetine  1. Does the  need for close, 24 hr/day medical supervision in concert with the patient's rehab needs make it unreasonable for this patient to be served in a less intensive setting? Yes  2. Co-Morbidities requiring supervision/potential complications: STEMI (Cont meds, monitor in accordance with increased physical activity and avoid UE resistance excercises), systolic CHF (Monitor in accordance with increased physical activity and avoid UE resistance excercises), HCAP (continue meds), severe dysphagia (continue SLP, consider vital stim), tobacco abuse (Continue to counsel), tachypnea (monitor RR and O2 Sats with increased physical exertion), HTN (monitor and provide prns in accordance with increased physical exertion and pain), DM (Monitor in accordance with exercise and adjust meds as necessary), hypokalemia (cont to monitor, replete as necessary), leukocytosis (cont to monitor for signs and symptoms of infection, further workup if indicated), ABLA (transfuse if necessary to ensure appropriate perfusion for increased activity tolerance), thrombocytosis (cont to monitor) 3. Due to bladder management, safety, skin/wound care, disease management and patient education, does the patient require 24 hr/day rehab nursing? Yes 4. Does the patient require coordinated care of a physician, rehab nurse, PT (1-2 hrs/day, 5 days/week), OT (1-2 hrs/day, 5 days/week) and SLP (1-2 hrs/day, 5 days/week) to address physical and functional deficits in the context of the above medical diagnosis(es)? Yes Addressing deficits in the following areas: balance, endurance, locomotion, strength, transferring, bowel/bladder control, bathing, dressing, feeding, grooming, toileting, cognition, speech, swallowing and psychosocial support 5. Can the patient actively participate in an intensive therapy program of at least 3 hrs of therapy per day at least 5 days per week? Yes 6. The potential for patient to make measurable gains while on inpatient rehab is  excellent 7. Anticipated functional outcomes upon discharge from inpatient rehab are supervision and min assist with PT, supervision and min assist with OT, modified independent and supervision with SLP. 8. Estimated rehab length of stay to reach the above functional goals is: 15-19 days. 9. Does the patient have adequate social supports and living environment to accommodate these discharge functional goals? No 10. Anticipated D/C setting: Other 11. Anticipated post D/C treatments: HH therapy and Home excercise program 12. Overall Rehab/Functional Prognosis: good  RECOMMENDATIONS: This patient's condition is appropriate for continued rehabilitative care in the following setting: Willl need to clarify ability of caregiver support on discharge. However, if pt does not have adequate support, he will likely require SNF. Will continue to follow patient as he becomes more medically stable and appropriate for discharge. Patient has agreed to participate in recommended program. Yes Note that insurance prior authorization may be required for reimbursement for recommended care.  Comment: Rehab Admissions Coordinator to follow up.  Maryla Morrow, MD 10/13/2015       Revision History     Date/Time User Provider Type Action   10/13/2015 4:55 PM Ankit Karis Juba, MD Physician Addend   10/13/2015 4:54 PM Ankit Karis Juba, MD Physician Sign   10/13/2015 4:40 PM Ankit Karis Juba, MD Physician Share   10/13/2015 1:47 PM Jacquelynn Cree, PA-C  Physician Assistant Share   View Details Report

## 2015-10-15 NOTE — Progress Notes (Signed)
Physical Therapy Treatment Patient Details Name: Eddie [name] MRN: [id number] DOB: 1951-02-15 Today's Date: 10/15/2015    History of Present Illness 65 year old male admitted 3/7 chest pain, becoming unresponsive shortely before arrival to ED. Patient with cardiac arrest (brief chest compressions), NSTEMI, intubated. Possible CVA symptoms noted on 3/13. 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 Extubated 3/20    PT Comments    Patient seen by therapies for activity progression and recovery s/p CVA. Patient remains extremely motivated with therapies and continues to demonstrate steady progress towards PT goals. Today patient was able to elicit trunk control while sitting EOB to maintain midline without assist. Patient also tolerated gait training with 2 person assist. Patient does demonstrate ataxic patterns and continued motor apraxia. Patient with limited ability to coordinate normalized patterns of movement. Continue to feel patient is an excellent candidate for CIR and feel comprehensive therapies would be appropriate and necessary for functional recovery.  Will continue to see as indicated and progress as tolerated.   Follow Up Recommendations  CIR     Equipment Recommendations   (TBD)    Recommendations for Other Services Rehab consult;OT consult     Precautions / Restrictions Precautions Precautions: Fall Restrictions Weight Bearing Restrictions: No    Mobility  Bed Mobility Overal bed mobility: Needs Assistance Bed Mobility: Supine to Sit     Supine to sit: Min guard     General bed mobility comments: pt able to exit bed on L side with cues to complete task. pt able to static sit EOB without (A)   Transfers Overall transfer level: Needs assistance Equipment used: 2 person hand held assist Transfers: Sit to/from Stand Sit to Stand: +2 physical assistance;Min assist         General transfer comment: Pt requires (A)  for balance immediately upon standing. Pt with posterior L lean initially. pt with weight shift task to help with awareness in space.  Ambulation/Gait Ambulation/Gait assistance: +2 physical assistance;Mod assist Ambulation Distance (Feet): 50 Feet Assistive device: 2 person hand held assist (wrap around support with gait belt) Gait Pattern/deviations: Step-to pattern;Decreased stride length;Ataxic;Trunk flexed;Staggering left;Staggering right;Narrow base of support;Scissoring Gait velocity: decreased   General Gait Details: patient with poor coordination and difficulty sequencing steps. Max facilitation for weight shifts and tactile as well as visual cues for placement of LLE during loading response   Stairs            Wheelchair Mobility    Modified Rankin (Stroke Patients Only)       Balance Overall balance assessment: Needs assistance Sitting-balance support: Bilateral upper extremity supported Sitting balance-Leahy Scale: Fair Sitting balance - Comments: patient able to self support in sitting with no physical assist required to maintain midline today   Standing balance support: During functional activity Standing balance-Leahy Scale: Poor                 High Level Balance Comments: Pt provided visual input of color contrrast of tile during transfer brace to help with widen base of support . pt with L LE scissor gait and adducting to midline. pt required tactile input for shoulder widen apart LE base of support. Pt able to use visual feedback to correct base of support. Pt needed total +2 facilitation for weight shift to decr scissor gait pattern with cues for neck extension.    Cognition Arousal/Alertness: Awake/alert Behavior During Therapy: WFL for tasks assessed/performed Overall Cognitive Status: Impaired/Different from baseline Area  of Impairment: Orientation;Attention;Memory;Following commands;Safety/judgement;Problem solving;Awareness Orientation Level:  Disoriented to;Time Current Attention Level: Selective Memory: Decreased short-term memory Following Commands: Follows multi-step commands with increased time Safety/Judgement: Decreased awareness of safety;Decreased awareness of deficits Awareness: Intellectual Problem Solving: Slow processing;Difficulty sequencing General Comments: Pt able to verbalize stroke and deficits on L side. pt recalled someone visiting yesterday about therapy but describes it as "outpatient" pt with no recall of fall in PM hours ( or is choosing not to report it when challenged with questioning) pt demonstrates poor recall during balanace / basic transfer in hallway with room number. pt educated room [id number]. pt states it back to therapist. pt reading next room number in hall. Pt asked is this your room. pt states "yes " and used visual cues of unfamiliar visitors in room and family verbalized "no we dont know him" to decide not to enter the room. Pt completed this same sequence at the next room. Pt choose the third room as his room because no one was present in the room and guessing.     Exercises Other Exercises Other Exercises: pt able to raise R hand and lift R knee in flexion/ hip flexion with L LE activated and static single leg standing with total +2 (A). pt demonstrates ability to activate gluteus and quad (no hyperexention noted) Pt when asked to step initiates with R LE due to L LE impairments.     General Comments        Pertinent Vitals/Pain Pain Assessment: No/denies pain    Home Living Family/patient expects to be discharged to:: Private residence Living Arrangements: Spouse/significant other Available Help at Discharge: Family Type of Home: House Home Access: Stairs to enter     Home Equipment: None Additional Comments: works at [employer] with wife     Prior Function Level of Independence: Independent          PT Goals (current goals can now be found in the care plan section) Acute Rehab PT  Goals Patient Stated Goal: none stated at this time PT Goal Formulation: With patient Time For Goal Achievement: 10/27/15 Potential to Achieve Goals: Good Progress towards PT goals: Progressing toward goals    Frequency  Min 4X/week    PT Plan Current plan remains appropriate    Co-evaluation PT/OT/SLP Co-Evaluation/Treatment: Yes Reason for Co-Treatment: Complexity of the patient's impairments (multi-system involvement);For patient/therapist safety PT goals addressed during session: Mobility/safety with mobility;Balance       End of Session Equipment Utilized During Treatment: Gait belt Activity Tolerance: Patient tolerated treatment well Patient left: in chair;with call bell/phone within reach;with chair alarm set     Time: 9622-2979 PT Time Calculation (min) (ACUTE ONLY): 31 min  Charges:  $Gait Training: 8-22 mins                    G CodesFabio Asa 10/28/2015, 1:48 PM Charlotte Crumb, PT DPT  [phone]

## 2015-10-15 NOTE — Discharge Summary (Signed)
Discharge Summary    Patient ID: Eddie [name],  MRN: [id number], DOB/AGE: 08/24/1950 65 y.o.  Admit date: 09/28/2015 Discharge date: 10/15/2015  Primary Care Provider: No primary care provider on file. Primary Cardiologist: New to [facility] - Dr. Antoine Poche  Discharge Diagnoses    Active Problems:   Cardiac arrest [facility])   ST elevation myocardial infarction (STEMI) (HCC)   STEMI (ST elevation myocardial infarction) (HCC)   Encounter for central line placement   Hypokalemia   Encephalopathy acute   Acute hypoxemic respiratory failure (HCC)   Acute respiratory failure (HCC)   Altered mental status   Hemiplegia (HCC)   Stroke (HCC)   ARDS (adult respiratory distress syndrome) (HCC)   Absolute anemia   Right-sided cerebrovascular accident (CVA) (HCC)   Acute on chronic systolic congestive heart failure (HCC)   HCAP (healthcare-associated pneumonia)   Dysphagia as late effect of cerebrovascular disease   Tobacco abuse   Tachypnea   Essential hypertension   Type 2 diabetes mellitus with complication (HCC)   Leukocytosis   History of Present Illness     [name] is a 65 yo male with no significant past medical history admitted on 09/28/2015 for an anterior STEMI with cardiac arrest. Upon arrival to the ED< he was unresponsive, went into PEA and was emergently intubated and taken directly to the catheterization lab.   Hospital Course     Consultants: Critical Care, Neurology, Physical Therapy, Occupational Therapy, Speech Therapy  His catheterization showed 100% stenosis of the Prox LAD with a Synergy DES placed. He was started on ASA and Brilinta (administered via NG tube). He was placed on the hypothermia protocol due to being a witnessed arrest. An echocardiogram was performed and showed an EF of 10% to 15% with the inferior myocardium consistent with infarction. Troponin values peaked at > 65.  He remained intubated and sedated the following day. He remained on Levophed to be  weaned as tolerated. He was able to open his eyes over the next several days but displayed no purposeful movements. Critical Care continued to follow the patient and he was started on Abx treatment for HCAP (MSSA in sputum) and assisted with sedation and his respiratory status throughout his hospitalization.   He was able to be taken off pressors on 10/01/2015 and was started on low-dose Coreg and Lisinopril due to his reduced EF. A CT Head on 10/01/2015 showed no acute intracranial abnormalities.   On 10/04/2015, it was noted he had new left-sided weakness once his sedation was able to be decreased. Head CT showed new right frontal white matter, possibly representative of an infarct. MRI confirmed an acute small right frontal lobe infarct. Neurology was consulted and continued to follow the patient and their recommendations were appreciated. It was thought this event was possibly cardioembolic, therefore he will be continued on ASA and Brilinta for now with plans to start Coumadin in 1 month with the transition from Brilinta to Plavix before the initiation of Coumadin. His Hgb was also followed closely during this time due to a drop of 13.9 at time of admission to 8.5 on 10/03/2015.  Attempts continued to be made to wean sedation but were unsuccessful for the next several days. A repeat echocardiogram was performed on 10/06/2015 and showed an improved EF of 30-35%. On 10/08/2015 inferior ST elevation was noted on his EKG and on telemetry. He was taken to the catheterization lab which showed the LAD stent was patent with moderate to severe disease of the  OM and mild to moderate mid-RCA disease which will be treated medically at this point in time. He was started on IV Heparin and NTG to treat possible coronary spasm.  On 10/12/2015, he was able to be successfully extubated. It has been thought previously before his CVA he may be a candidate for a Life Vest, but due to his decline in neurocognitive functioning, he  was not thought to be a good candidate for the device. A repeat echocardiogram in 3 months could be considered to reassess his EF.   He was seen by Physical Therapy on 10/13/2015 and they recommended Inpatient Rehabilitation. Was also evaluated by Occupational Therapy who recommended this as well. Physical Medicine consulted on the patient and he has been approved by his insurance. He has also been evaluated by Speech Therapy and was transitioned today to a puree/ honey consistency diet.   He will be transferred to Inpatient Rehabilitation later today. Will continue on ASA, Brilinta, high-dose statin, BB, and ACE-I. Please review the discussion above about switching to Coumadin in one month. His Ancef was discontinued prior to transfer, for he has completed a 10-day course of treatment.   Discharge Vitals Blood pressure 134/88, pulse 79, temperature 97.9 F (36.6 C), temperature source Oral, resp. rate 18, height 5\' 7"  (1.702 m), weight 127 lb (57.607 kg), SpO2 94 %.  Filed Weights   10/13/15 0324 10/14/15 0317 10/15/15 0500  Weight: 135 lb 12.9 oz (61.6 kg) 140 lb 3.4 oz (63.6 kg) 127 lb (57.607 kg)    Labs & Radiologic Studies     CBC  Recent Labs  10/13/15 0341 10/14/15 0308  WBC 19.8* 17.8*  HGB 8.4* 9.4*  HCT 28.4* 31.4*  MCV 81.1 81.1  PLT 712* 811*   Basic Metabolic Panel  Recent Labs  10/13/15 0341 10/14/15 0308 10/15/15 0330  NA 145 145 148*  K 3.4* 3.8 3.3*  CL 99* 105 106  CO2 32 27 28  GLUCOSE 206* 163* 95  BUN 40* 37* 41*  CREATININE 1.09 1.03 1.10  CALCIUM 9.1 9.2 9.1  MG 2.4 2.7*  --   PHOS 3.2 3.2 3.8   Liver Function Tests  Recent Labs  10/14/15 0308 10/15/15 0330  ALBUMIN 2.9* 3.1*    Ct Angio Head W/cm &/or Wo Cm  10/05/2015  CLINICAL DATA:  Acute posterior right frontal lobe infarct on MRI. EXAM: CT ANGIOGRAPHY HEAD AND NECK TECHNIQUE: Multidetector CT imaging of the head and neck was performed using the standard protocol during bolus  administration of intravenous contrast. Multiplanar CT image reconstructions and MIPs were obtained to evaluate the vascular anatomy. Carotid stenosis measurements (when applicable) are obtained utilizing NASCET criteria, using the distal internal carotid diameter as the denominator. CONTRAST:  50mL OMNIPAQUE IOHEXOL 350 MG/ML SOLN COMPARISON:  Brain MRI and CT 10/04/2015. No prior angiographic imaging. FINDINGS: CT HEAD Brain: Small focus of hypoattenuation in the posterior right frontal lobe white matter corresponds to the acute infarct demonstrated on yesterday's MRI. There is no evidence of acute intracranial hemorrhage, mass, midline shift, or extra-axial fluid collection. Mild white matter hypoattenuation adjacent to the right temporal horn as on MRI. Ventricles and sulci are within normal limits for age. Calvarium and skull base: No destructive osseous lesion. Paranasal sinuses: Mild mucosal thickening, most notable in the left sphenoid sinus. Orbits: Postoperative changes to the right globe. CTA NECK Aortic arch: 3 vessel aortic arch with mild atherosclerotic calcification. Brachiocephalic and subclavian arteries are widely patent with mild non stenotic plaque.  Right carotid system: Patent with mild mixed calcified and noncalcified plaque at the carotid bifurcation and in the proximal ICA. No significant stenosis. Left carotid system: Patent with minimal atherosclerotic luminal irregularity. No stenosis. Vertebral arteries:Patent and codominant without stenosis. Skeleton: Mild cervical spondylosis. Other neck: Poor dentition with multiple periapical lucencies and missing teeth. Endotracheal and enteric tubes with small volume pharyngeal fluid. Extensive consolidative opacities partially visualized in the lung apices. Pleural effusions partially visualized. Left jugular venous catheter terminating in the proximal SVC. CTA HEAD Anterior circulation: Internal carotid arteries are patent from skullbase to  carotid termini. There is mild bilateral carotid siphon atherosclerosis without significant stenosis. ACAs and MCAs are patent without evidence of major branch occlusion or significant proximal stenosis. There is an early MCA bifurcation on the right. No intracranial aneurysm is identified. Posterior circulation: Intracranial vertebral arteries are widely patent to the vertebrobasilar junction. PICA, AICA, and SCA origins are patent. Basilar artery is patent without stenosis. Posterior communicating arteries are not clearly identified. PCAs are patent without evidence of significant stenosis. Venous sinuses: Patent. Anatomic variants: None. Delayed phase: No abnormal enhancement. IMPRESSION: 1. Very mild intracranial and extracranial atherosclerosis without evidence of major vessel occlusion or significant stenosis. 2. Partially visualized bilateral pleural effusions and extensive bilateral lung consolidation which may represent ARDS. Electronically Signed   By: Sebastian Ache M.D.   On: 10/05/2015 13:40   Ct Head Wo Contrast  10/14/2015  CLINICAL DATA:  Fall.  Initial encounter. EXAM: CT HEAD WITHOUT CONTRAST TECHNIQUE: Contiguous axial images were obtained from the base of the skull through the vertex without intravenous contrast. COMPARISON:  Head CTA 10/05/2015 and MRI 10/04/2015 FINDINGS: A small focus of hypoattenuation is again seen in the the posterior right frontal white matter/ centrum semiovale corresponding to the infarct demonstrated earlier this month. There is no evidence of new infarct, intracranial hemorrhage, mass, midline shift, or extra-axial fluid collection. Ventricles and sulci are within normal limits for age. Postoperative changes to the right globe. Mild mucosal thickening in the sphenoid sinuses. Trace left mastoid effusion. Mild calcified atherosclerosis at the skullbase. No skull fracture identified. IMPRESSION: 1. Evolving, small, subacute posterior right frontal infarct. 2. No  evidence of new intracranial abnormality. Electronically Signed   By: Sebastian Ache M.D.   On: 10/14/2015 23:50   Ct Head Wo Contrast  10/04/2015  CLINICAL DATA:  New left-sided weakness. EXAM: CT HEAD WITHOUT CONTRAST TECHNIQUE: Contiguous axial images were obtained from the base of the skull through the vertex without intravenous contrast. COMPARISON:  10/01/2015 FINDINGS: Skull and Sinuses:No acute osseous finding. Nasopharyngeal fluid in the setting of intubation. Sphenoid sinusitis. Visualized orbits: Right cataract resection and probable glaucoma drainage device. Brain: No evidence of cortical infarction, hemorrhage, hydrocephalus, or mass lesion/mass effect. Small areas of centrum semiovale low-density bilaterally is likely small vessel ischemic change but the finding is better/newly seen on the right. IMPRESSION: 1. No hemorrhage or cortical infarct. 2. Small area of right frontal white matter low density would usually reflect chronic microvascular disease but is newly seen, raising the possibility of interval white matter infarct. Electronically Signed   By: Marnee Spring M.D.   On: 10/04/2015 03:33   Ct Head Wo Contrast  10/01/2015  CLINICAL DATA:  Altered mental status, intubated EXAM: CT HEAD WITHOUT CONTRAST TECHNIQUE: Contiguous axial images were obtained from the base of the skull through the vertex without intravenous contrast. COMPARISON:  None. FINDINGS: Brain: No intracranial hemorrhage, mass effect or midline shift. There is no ventriculomegaly.  No acute cortical infarction. No mass lesion is noted on this unenhanced scan. The gray and white-matter differentiation is preserved. No intra or extra-axial fluid collection. Vascular: No hyperdense vessel or unexpected calcification. Skull: Negative for fracture or focal lesion. Sinuses/Orbits: Mild mucosal thickening bilateral posterior aspect of maxillary sinus. Mild mucosal thickening bilateral ethmoid air cells. There is mucosal thickening  with partial opacification bilateral sphenoid sinuses. Other: Mastoid air cells are unremarkable. Secretions are noted in nasopharynx. IMPRESSION: 1. No acute intracranial abnormality. No definite acute cortical infarction. 2. Paranasal sinuses disease as described above. Secretions are noted in nasopharynx. Electronically Signed   By: Natasha Mead M.D.   On: 10/01/2015 12:55   Ct Angio Neck W/cm &/or Wo/cm  10/05/2015  CLINICAL DATA:  Acute posterior right frontal lobe infarct on MRI. EXAM: CT ANGIOGRAPHY HEAD AND NECK TECHNIQUE: Multidetector CT imaging of the head and neck was performed using the standard protocol during bolus administration of intravenous contrast. Multiplanar CT image reconstructions and MIPs were obtained to evaluate the vascular anatomy. Carotid stenosis measurements (when applicable) are obtained utilizing NASCET criteria, using the distal internal carotid diameter as the denominator. CONTRAST:  50mL OMNIPAQUE IOHEXOL 350 MG/ML SOLN COMPARISON:  Brain MRI and CT 10/04/2015. No prior angiographic imaging. FINDINGS: CT HEAD Brain: Small focus of hypoattenuation in the posterior right frontal lobe white matter corresponds to the acute infarct demonstrated on yesterday's MRI. There is no evidence of acute intracranial hemorrhage, mass, midline shift, or extra-axial fluid collection. Mild white matter hypoattenuation adjacent to the right temporal horn as on MRI. Ventricles and sulci are within normal limits for age. Calvarium and skull base: No destructive osseous lesion. Paranasal sinuses: Mild mucosal thickening, most notable in the left sphenoid sinus. Orbits: Postoperative changes to the right globe. CTA NECK Aortic arch: 3 vessel aortic arch with mild atherosclerotic calcification. Brachiocephalic and subclavian arteries are widely patent with mild non stenotic plaque. Right carotid system: Patent with mild mixed calcified and noncalcified plaque at the carotid bifurcation and in the  proximal ICA. No significant stenosis. Left carotid system: Patent with minimal atherosclerotic luminal irregularity. No stenosis. Vertebral arteries:Patent and codominant without stenosis. Skeleton: Mild cervical spondylosis. Other neck: Poor dentition with multiple periapical lucencies and missing teeth. Endotracheal and enteric tubes with small volume pharyngeal fluid. Extensive consolidative opacities partially visualized in the lung apices. Pleural effusions partially visualized. Left jugular venous catheter terminating in the proximal SVC. CTA HEAD Anterior circulation: Internal carotid arteries are patent from skullbase to carotid termini. There is mild bilateral carotid siphon atherosclerosis without significant stenosis. ACAs and MCAs are patent without evidence of major branch occlusion or significant proximal stenosis. There is an early MCA bifurcation on the right. No intracranial aneurysm is identified. Posterior circulation: Intracranial vertebral arteries are widely patent to the vertebrobasilar junction. PICA, AICA, and SCA origins are patent. Basilar artery is patent without stenosis. Posterior communicating arteries are not clearly identified. PCAs are patent without evidence of significant stenosis. Venous sinuses: Patent. Anatomic variants: None. Delayed phase: No abnormal enhancement. IMPRESSION: 1. Very mild intracranial and extracranial atherosclerosis without evidence of major vessel occlusion or significant stenosis. 2. Partially visualized bilateral pleural effusions and extensive bilateral lung consolidation which may represent ARDS. Electronically Signed   By: Sebastian Ache M.D.   On: 10/05/2015 13:40   Mr Brain Wo Contrast  10/05/2015  CLINICAL DATA:  Cardiac arrest, respiratory failure. Ventilated patient, not moving LEFT arm. Evaluate hemiplegia. EXAM: MRI HEAD WITHOUT CONTRAST TECHNIQUE: Multiplanar, multiecho pulse sequences  of the brain and surrounding structures were obtained  without intravenous contrast. COMPARISON:  CT head October 04, 2015 0304 hours FINDINGS: Focal 13 x 9 mm area of reduced diffusion LEFT posterior frontal deep white matter, with corresponding low ADC value. No susceptibility artifact to suggest hemorrhage. Intermediate FLAIR T2 hyperintense signal along RIGHT temporal horn. A few scattered supratentorial subcentimeter white matter FLAIR T2 hyperintensities. Tiny old cerebellar infarcts. Small LEFT caudate head lacunar infarct. No abnormal extra-axial fluid collections. Normal major intracranial vascular flow voids present at skull base. Status post RIGHT ocular lens implant in mild susceptibility artifact, likely representing glaucoma drainage implant. No abnormal sellar expansion. No cerebellar tonsillar ectopia. No suspicious calvarial bone marrow signal. Life-support lines in place. Mild paranasal sinus mucosal thickening with bilateral mastoid effusions. IMPRESSION: Acute small RIGHT for frontal lobe white matter infarct. Mild white matter changes compatible with chronic small vessel ischemic disease. 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 Abnormal RIGHT temporal periventricular signal favoring old ischemia and gliosis though somewhat atypical in location. Recommend follow-up MRI of the brain with contrast on a nonemergent basis. Electronically Signed   By: Awilda Metro M.D.   On: 10/05/2015 00:54   Dg Chest Port 1 View  10/12/2015  CLINICAL DATA:  Shortness of breath, cardiac arrest, acute respiratory failure with intubation. EXAM: PORTABLE CHEST 1 VIEW COMPARISON:  Portable chest x-ray of October 11, 2015 FINDINGS: The lungs are well-expanded. The interstitial markings remain increased but have improved somewhat since yesterday's study. The cardiac silhouette is top-normal in size. The central pulmonary vascularity is prominent. Cephalization has decreased. The mediastinum is normal in width. There is mild tortuosity of the descending  thoracic aorta. The endotracheal tube tip lies approximately 7 cm above the carina. The esophagogastric tube tip projects below the inferior margin of the image. The left internal jugular venous catheter tip projects at the junction of the right and left brachiocephalic veins. IMPRESSION: Slight further interval improvement in the pulmonary interstitium consistent with resolving CHF. There is no alveolar pneumonia. The support tubes are in stable position. The endotracheal tube should be advanced approximately 3 cm as recommended on yesterday's study. Electronically Signed   By: David  [place] M.D.   On: 10/12/2015 07:27   Dg Chest Port 1 View  10/11/2015  CLINICAL DATA:  Acute respiratory failure EXAM: PORTABLE CHEST 1 VIEW COMPARISON:  10/09/2015 FINDINGS: Endotracheal tube 7 cm above the carina unchanged. Recommend advancing 3 cm. Central venous catheter tip in the SVC unchanged. NG tube enters the stomach. Lungs remain clear without infiltrate effusion or edema. No effusion IMPRESSION: Endotracheal tube remains high in to be advanced 3 cm. This is unchanged Lungs remain clear Electronically Signed   By: Marlan Palau M.D.   On: 10/11/2015 07:14   Dg Chest Port 1 View  10/09/2015  CLINICAL DATA:  Intubated EXAM: PORTABLE CHEST 1 VIEW COMPARISON:  Chest radiograph from one day prior. FINDINGS: Endotracheal tube tip is 7 cm above the carina at the thoracic inlet. Enteric tube enters the stomach with the tip not seen on this image. Left internal jugular central venous catheter terminates in the upper third of the superior vena cava. Stable cardiomediastinal silhouette with normal heart size. No pneumothorax. No pleural effusion. No overt pulmonary edema. No consolidative airspace disease. IMPRESSION: 1. Endotracheal tube tip is 7 cm above the carina at the thoracic inlet, consider advancing 2-3 cm . 2. No overt pulmonary edema.  No consolidative airspace disease. Electronically Signed  By: Delbert Phenix M.D.    On: 10/09/2015 09:36   Dg Chest Port 1 View  10/08/2015  CLINICAL DATA:  Respiratory failure EXAM: PORTABLE CHEST 1 VIEW COMPARISON:  Yesterday FINDINGS: Endotracheal tube, NG tube, left jugular venous catheter are stable. Pulmonary edema has improved. No pneumothorax. No pleural effusion. IMPRESSION: Improved pulmonary edema. Electronically Signed   By: Jolaine Click M.D.   On: 10/08/2015 08:12   Dg Chest Port 1 View  10/07/2015  CLINICAL DATA:  Respiratory failure. EXAM: PORTABLE CHEST 1 VIEW COMPARISON:  October 06, 2015. FINDINGS: Stable cardiomediastinal silhouette. Endotracheal and nasogastric tubes are unchanged in position. Left internal jugular catheter is unchanged in position. No pneumothorax or significant pleural effusion is noted. Bilateral upper lobe opacities are improved compared to prior exam. Stable bibasilar opacities are noted consistent with edema or pneumonia. IMPRESSION: Stable support apparatus. Improved bilateral upper lobe opacities are noted, with stable bibasilar opacities, concerning for edema or pneumonia. Electronically Signed   By: Lupita Raider, M.D.   On: 10/07/2015 07:20   Dg Chest Port 1 View  10/06/2015  CLINICAL DATA:  Hypoxia EXAM: PORTABLE CHEST 1 VIEW COMPARISON:  October 05, 2015 FINDINGS: Endotracheal tube tip is 3.6 cm above the carina. Central catheter tip is in the superior vena cava. Nasogastric tube tip and side port below the diaphragm. No pneumothorax. There is extensive interstitial and alveolar edema bilaterally, stable. Heart is mildly enlarged with pulmonary venous hypertension. No new opacity. No adenopathy evident. IMPRESSION: Tube and catheter positions as described without pneumothorax. Widespread interstitial and alveolar edema, stable. No new opacity. The appearance is consistent with congestive heart failure, although superimposed pneumonia cannot be excluded radiographically. Both entities may exist concurrently. No change in cardiac silhouette.  Electronically Signed   By: Bretta Bang III M.D.   On: 10/06/2015 07:15   Dg Chest Port 1 View  10/05/2015  CLINICAL DATA:  Respiratory failure, smoker EXAM: PORTABLE CHEST 1 VIEW COMPARISON:  Portable exam 0522 hours compared to 10/03/2015 FINDINGS: Tip of endotracheal tube projects 3.7 cm above carina. Nasogastric tube extends into stomach. LEFT jugular central venous catheter tip projects over SVC. Enlargement of cardiac silhouette. Diffuse BILATERAL pulmonary infiltrates increased since previous exam likely pulmonary edema though infection is not excluded. No pleural effusion or pneumothorax. Bones unremarkable. IMPRESSION: Satisfactory line and tube positions. Diffuse BILATERAL pulmonary infiltrates significantly increased since previous exam question pulmonary edema though multifocal pneumonia not excluded. Electronically Signed   By: Ulyses Southward M.D.   On: 10/05/2015 08:12   Dg Chest Port 1 View  10/03/2015  CLINICAL DATA:  Respiratory failure EXAM: PORTABLE CHEST 1 VIEW COMPARISON:  10/01/2015 FINDINGS: Endotracheal tube terminates 4.5 cm above the carina. Cardiomegaly with pulmonary vascular congestion. No frank interstitial edema. No pleural effusion or pneumothorax. Enteric tube courses into the mid stomach. IMPRESSION: Endotracheal tube terminates 4.5 cm above the carina. Cardiomegaly with pulmonary vascular congestion. No frank interstitial edema. Electronically Signed   By: Charline Bills M.D.   On: 10/03/2015 09:23   Dg Chest Port 1 View  10/01/2015  CLINICAL DATA:  Acute respiratory failure EXAM: PORTABLE CHEST 1 VIEW COMPARISON:  09/30/2015 FINDINGS: Stable endotracheal tube, NG tube, and central line. Mild cardiac enlargement. Vascular pattern normal. Mild residual interstitial prominence and hazy infrahilar opacity on the right. IMPRESSION: Further improvement in findings of pulmonary edema. Electronically Signed   By: Esperanza Heir M.D.   On: 10/01/2015 07:16   Dg Chest [facility]  09/30/2015  CLINICAL DATA:  Acute respiratory failure, shortness of breath, cardiac arrest, STEMI EXAM: PORTABLE CHEST 1 VIEW COMPARISON:  Portable chest x-ray of September 28, 2015 FINDINGS: The lungs are well-expanded. There has been overall improvement in the pulmonary interstitial and alveolar opacities. The pulmonary vascularity remains somewhat indistinct. There remain coarse lung markings in the right infrahilar region. There is no large pleural effusion and no pneumothorax. The heart is normal in size. The endotracheal tube tip lies 5 cm above the carina. The esophagogastric tube tip projects below the inferior margin of the image. The left internal jugular venous catheter tip projects over the midportion of the SVC. IMPRESSION: Improving pulmonary interstitial and alveolar edema. No evidence of pneumothorax or large pleural effusion. Subsegmental atelectasis in the right lower lobe posterior medially. The support tubes are in reasonable position. Electronically Signed   By: David  [place] M.D.   On: 09/30/2015 07:23   Dg Chest Port 1 View  09/28/2015  CLINICAL DATA:  Central line placement. Endotracheal tube. Post arrest. EXAM: PORTABLE CHEST 1 VIEW COMPARISON:  09/28/2015 FINDINGS: Endotracheal tube with tip measuring 5.7 cm above the carina. Enteric tube tip is off the field of view but below the left hemidiaphragm. New left central venous catheter with tip over the low SVC region. No pneumothorax. Normal heart size. Persistent diffuse bilateral parenchymal infiltrates in the lungs without significant change since previous study. No blunting of costophrenic angles. IMPRESSION: Appliances appear in satisfactory position. No pneumothorax. Persistent diffuse bilateral pulmonary infiltrates. Electronically Signed   By: Burman Nieves M.D.   On: 09/28/2015 06:47   Dg Chest Port 1 View  09/28/2015  CLINICAL DATA:  Post CPR.  Code STEMI. EXAM: PORTABLE CHEST 1 VIEW COMPARISON:  None. FINDINGS:  Endotracheal tube with tip measuring 4.2 cm above the carina. Enteric tube tip is off the field of view but below the left hemidiaphragm. Borderline heart size with normal pulmonary vascularity. Bilateral diffuse parenchymal infiltrates centered in the perihilar regions. This could represent fluid overload, pneumonia, or aspiration. Visualized bones appear intact. IMPRESSION: Appliances appear in satisfactory location. Diffuse bilateral parenchymal infiltrates could indicate edema, pneumonia, or aspiration. Electronically Signed   By: Burman Nieves M.D.   On: 09/28/2015 04:39   Dg Abd Portable 1v  10/14/2015  CLINICAL DATA:  Check feeding catheter placement EXAM: PORTABLE ABDOMEN - 1 VIEW COMPARISON:  10/13/2015 FINDINGS: The nasogastric catheter has been exchanged for feeding catheter which now lies at the level of the lower channel and possibly in the proximal duodenum. Scattered large and small bowel gas is again identified. No obstructive changes are seen. No acute bony abnormality is seen. IMPRESSION: Feeding catheter within the distal stomach Electronically Signed   By: Alcide Clever M.D.   On: 10/14/2015 14:32   Dg Abd Portable 1v  10/13/2015  CLINICAL DATA:  NG tube advancement. EXAM: PORTABLE ABDOMEN - 1 VIEW COMPARISON:  10/13/2015 at 2152 hours FINDINGS: The enteric tube has been advanced, with tip and side hole now overlying the stomach, both well beyond the GE junction. A moderate amount of colonic stool is noted. There is no evidence of bowel obstruction. IMPRESSION: Interval advancement of enteric as above. Electronically Signed   By: Sebastian Ache M.D.   On: 10/13/2015 23:04   Dg Abd Portable 1v  10/13/2015  CLINICAL DATA:  Nasogastric tube placement EXAM: PORTABLE ABDOMEN - 1 VIEW COMPARISON:  None. FINDINGS: Nasogastric tube tip is in the proximal stomach. The side port is just above the  gastroesophageal junction. The bowel gas pattern is normal. Lung bases are clear. IMPRESSION:  Nasogastric tube tip is in the proximal stomach. Side-port is slightly above the gastroesophageal junction. Advise advancing nasogastric tube approximately 5-6 cm to insure that both the nasogastric tube and side port are well within the stomach. Bowel gas pattern unremarkable. Electronically Signed   By: Bretta Bang III M.D.   On: 10/13/2015 22:01   Dg Abd Portable 1v  10/13/2015  CLINICAL DATA:  NG tube placement EXAM: PORTABLE ABDOMEN - 1 VIEW COMPARISON:  None. FINDINGS: There is normal small bowel gas pattern. NG feeding tube in place with tip in distal stomach. IMPRESSION: NG tube with tip in distal stomach.  Normal small bowel gas pattern. Electronically Signed   By: Natasha Mead M.D.   On: 10/13/2015 15:29   Dg Swallowing Func-speech Pathology  10/15/2015  Objective Swallowing Evaluation: Type of Study: MBS-Modified Barium Swallow Study Patient Details Name: Fareed Fung MRN: [id number] Date of Birth: [birth year] Today's Date: 10/15/2015 Time: SLP Start Time (ACUTE ONLY): 1033-SLP Stop Time (ACUTE ONLY): 1049 SLP Time Calculation (min) (ACUTE ONLY): 16 min Past Medical History: Past Medical History Diagnosis Date . PUD (peptic ulcer disease)  Past Surgical History: Past Surgical History Procedure Laterality Date . Repair of peptic ulcer   . Cardiac catheterization N/A 09/28/2015   Procedure: Left Heart Cath and Coronary Angiography;  Surgeon: Runell Gess, MD;  Location: [facility] INVASIVE CV LAB;  Service: Cardiovascular;  Laterality: N/A; . Cardiac catheterization N/A 10/08/2015   Procedure: Left Heart Cath and Coronary Angiography;  Surgeon: Lyn Records, MD;  Location: [facility] INVASIVE CV LAB;  Service: Cardiovascular;  Laterality: N/A; HPI: 65 year old male admitted 3/7 chest pain, becoming unresponsive shortely before arrival to ED. Patient with cardiac arrest (brief chest compressions),  NSTEMI, intubated. Possible CVA symptoms noted on 3/13. 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 Extubated 3/20.  No Data Recorded Assessment / Plan / Recommendation CHL IP CLINICAL IMPRESSIONS 10/15/2015 Therapy Diagnosis -- Clinical Impression MBS complete. Pt exhibited mod-severe pharyngeal phase dysphagia characterized by sensory > motor deficits. Delayed swallow intiation to the pyriform sinuses, due to impaired sensation, culminated in gross silent aspiration of honey and nectar thick liquids during the swallow. Min-mod pharyngeal residue in vallecula and pyriform sinuses observed across all consistencies as a result of decreased tongue base retraction and reduced anterior laryngeal mobility. Pt able to reduce amount of residue with mod verbal cues provided by SLP to perform multiple dry swallows after each sip/bite. Recommend Dys 1 (puree) and pudding thick liquids, crushed meds, multiple swallows after each bite/sip) during intake to reduce risk of aspiration of residuals. Pt educated re: diet recommendation and allowed pt to view MBS on monitor after study. SLP will f/u to determine diet tolerance and safety. Impact on safety and function --   CHL IP TREATMENT RECOMMENDATION 10/15/2015 Treatment Recommendations Therapy as outlined in treatment plan below   Prognosis 10/15/2015 Prognosis for Safe Diet Advancement Good Barriers to Reach Goals Severity of deficits;Cognitive deficits Barriers/Prognosis Comment -- CHL IP DIET RECOMMENDATION 10/15/2015 SLP Diet Recommendations Dysphagia 1 (Puree) solids;Pudding thick liquid Liquid Administration via Cup;No straw Medication Administration Crushed with puree Compensations Minimize environmental distractions;Slow rate;Small sips/bites;Multiple dry swallows after each bite/sip;Clear throat intermittently Postural Changes Seated upright at 90 degrees   CHL IP OTHER RECOMMENDATIONS 10/15/2015 Recommended Consults -- Oral Care Recommendations Oral care BID Other Recommendations Prohibited food (jello, ice cream, thin  soups);Order  thickener from pharmacy;Remove water pitcher;Have oral suction available   CHL IP FOLLOW UP RECOMMENDATIONS 10/15/2015 Follow up Recommendations Inpatient Rehab   CHL IP FREQUENCY AND DURATION 10/15/2015 Speech Therapy Frequency (ACUTE ONLY) min 3x week Treatment Duration 2 weeks      CHL IP ORAL PHASE 10/15/2015 Oral Phase WFL Oral - Pudding Teaspoon -- Oral - Pudding Cup -- Oral - Honey Teaspoon -- Oral - Honey Cup -- Oral - Nectar Teaspoon -- Oral - Nectar Cup -- Oral - Nectar Straw -- Oral - Thin Teaspoon -- Oral - Thin Cup -- Oral - Thin Straw -- Oral - Puree -- Oral - Mech Soft -- Oral - Regular -- Oral - Multi-Consistency -- Oral - Pill -- Oral Phase - Comment --  CHL IP PHARYNGEAL PHASE 10/15/2015 Pharyngeal Phase Impaired Pharyngeal- Pudding Teaspoon -- Pharyngeal -- Pharyngeal- Pudding Cup -- Pharyngeal -- Pharyngeal- Honey Teaspoon -- Pharyngeal -- Pharyngeal- Honey Cup Delayed swallow initiation-pyriform sinuses;Significant aspiration (Amount);Penetration/Aspiration during swallow;Reduced anterior laryngeal mobility;Pharyngeal residue - valleculae;Pharyngeal residue - pyriform;Reduced tongue base retraction Pharyngeal Material enters airway, passes BELOW cords without attempt by patient to eject out (silent aspiration) Pharyngeal- Nectar Teaspoon -- Pharyngeal -- Pharyngeal- Nectar Cup Penetration/Aspiration during swallow;Pharyngeal residue - valleculae;Pharyngeal residue - pyriform;Reduced tongue base retraction;Reduced anterior laryngeal mobility;Delayed swallow initiation-pyriform sinuses;Significant aspiration (Amount) Pharyngeal Material enters airway, passes BELOW cords without attempt by patient to eject out (silent aspiration) Pharyngeal- Nectar Straw -- Pharyngeal -- Pharyngeal- Thin Teaspoon -- Pharyngeal -- Pharyngeal- Thin Cup -- Pharyngeal -- Pharyngeal- Thin Straw -- Pharyngeal -- Pharyngeal- Puree Pharyngeal residue - valleculae;Pharyngeal residue - pyriform;Reduced tongue base  retraction;Reduced anterior laryngeal mobility;Delayed swallow initiation-vallecula Pharyngeal -- Pharyngeal- Mechanical Soft -- Pharyngeal -- Pharyngeal- Regular -- Pharyngeal -- Pharyngeal- Multi-consistency -- Pharyngeal -- Pharyngeal- Pill -- Pharyngeal -- Pharyngeal Comment --  CHL IP CERVICAL ESOPHAGEAL PHASE 10/15/2015 Cervical Esophageal Phase WFL Pudding Teaspoon -- Pudding Cup -- Honey Teaspoon -- Honey Cup -- Nectar Teaspoon -- Nectar Cup -- Nectar Straw -- Thin Teaspoon -- Thin Cup -- Thin Straw -- Puree -- Mechanical Soft -- Regular -- Multi-consistency -- Pill -- Cervical Esophageal Comment -- No flowsheet data found. Royce Macadamia 10/15/2015, 1:37 PM Breck Coons [place] Face.Ed CCC-SLP Pager [phone]                Diagnostic Studies/Procedures     Cardiac Catheterization: 09/28/2015  Ost 1st Mrg to 1st Mrg lesion, 80% stenosed.  Prox LAD lesion, 100% stenosed. Post intervention, there is a 0% residual stenosis.   IMPRESSION:Mr. Lowder had an anterior STEMI reated with PCI and drug-eluting stenting with a Synergy 3 mm x 16 mm long stent. The door to balloon time was 22 minutes. Since the patient was a witnessed arrest he would be a candidate for [employer] / cooling. He received 180 mg of Brilenta crushed down the NG tube. I will continue full dose and hematocrit 4 hours. The sheath will then be removed and pressure held. [facility] and has been notified. A 2-D echocardiogram will be performed. The patient be treated with standard post MI protocol including beta blocker, ACE inhibitor, dual antiplatelet therapy and high-dose statin. He left the cardiac catheterization lab hemodynamically stable, intubated and sedated. Plan will be to treat the obtuse marginal branch medically at this time.  Echocardiogram: 09/28/2015 Study Conclusions  - Left ventricle: The cavity size was moderately dilated. Wall  thickness was increased in a pattern of mild LVH. Systolic  function was severely  reduced. The estimated  ejection fraction  was in the range of 10% to 15%. of the mid-apicalanteroseptal and  inferior myocardium; consistent with infarction. Doppler  parameters are consistent with abnormal left ventricular  relaxation (grade 1 diastolic dysfunction).   Cardiac Catheterization: 10/08/2015  Ost 1st Mrg to 1st Mrg lesion, 80% stenosed.  Prox LAD-2 lesion, 50% stenosed.  Mid RCA lesion, 65% stenosed.   The LAD stent is patent. Initial angiogram suggested some haziness proximal to the stent. TIMI grade 3 flow was noted throughout the LAD on each left coronary image that was obtained.  Moderate to severe first obtuse marginal disease.  Moderate mid right coronary disease.  Moderate mid to distal and apical anterior wall hypokinesis with EF 35%.  ECG post-cath is unchanged from the precath, demonstrating evidence of anterior wall injury.   Recommendations:  IV heparin to start 8 hours after sheath pull. I would recommend 24 hour duration.  IV nitroglycerin at 10 mcg/m as tolerated by blood pressure (to treat possible coronary artery spasm).  Cycle cardiac markers.  Echocardiogram: 10/06/2015  Study Conclusions - Left ventricle: The cavity size was mildly dilated. There was  mild concentric hypertrophy. Systolic function was moderately to  severely reduced. The estimated ejection fraction was in the  range of 30% to 35%. Doppler parameters are consistent with  abnormal left ventricular relaxation (grade 1 diastolic  dysfunction). - Regional wall motion abnormality: Hypokinesis of the mid  anteroseptal, basal-mid anterolateral, apical septal, apical  lateral, and apical myocardium. - Aortic valve: Transvalvular velocity was within the normal range.  There was no stenosis. There was no regurgitation. - Mitral valve: Transvalvular velocity was within the normal range.  There was no evidence for stenosis. There was trivial  regurgitation. - Right  ventricle: The cavity size was normal. Wall thickness was  normal. Systolic function was normal. - Atrial septum: No defect or patent foramen ovale was identified  by color flow Doppler. - Tricuspid valve: There was trivial regurgitation.  Disposition   Pt is being discharged to Inpatient Rehabilitation.  Follow-up Plans & Appointments    Follow-up Information    Follow up with Xu,Jindong, MD. Schedule an appointment as soon as possible for a visit in 2 months.   Specialty:  Neurology   Why:  stroke clinic   Contact information:   [address] [place] [address] [phone]      Discharge Instructions    AMB Referral to Cardiac Rehabilitation - Phase II    Complete by:  As directed   Diagnosis:  Myocardial Infarction           Discharge Medications   Will continue on current medications as listed in the [facility].   Allergies No Known Allergies   Outstanding Labs/Studies   None  Duration of Discharge Encounter   Greater than 30 minutes including physician time.  Signed, Ellsworth Lennox, PA-C 10/15/2015, 4:13 PM

## 2015-10-15 NOTE — Progress Notes (Signed)
MBSS complete. Full report located under chart review in imaging section.  Frank silent aspiration with honey and nectar consistency. Recommend: Dys 1 (puree) and Pudding thick liquids. Nothing thinner than pudding.  Breck Coons [place].Ed [employer] [phone]

## 2015-10-15 NOTE — Progress Notes (Signed)
Pt's bed alarm was going off this pm. RN found pt on knees beside bed. Pt in high low bed with mats on floor. Pt told me he just wanted to sit on his knees so he got out of bed and sat on his knees. Pt did not have any injuries that made me suspect a fall had occurred. Pt highly impulsive. Rn explained calling for assistance when getting up. Pt left in bed with bed alarm in place and bed in lowest position.

## 2015-10-15 NOTE — Progress Notes (Signed)
Hospital Problem List     Active Problems:   Cardiac arrest (HCC)   ST elevation myocardial infarction (STEMI) (HCC)   STEMI (ST elevation myocardial infarction) (HCC)   Encounter for central line placement   Hypokalemia   Encephalopathy acute   Acute hypoxemic respiratory failure (HCC)   Acute respiratory failure (HCC)   Altered mental status   Hemiplegia (HCC)   Stroke (HCC)   ARDS (adult respiratory distress syndrome) (HCC)   Absolute anemia   Right-sided cerebrovascular accident (CVA) (HCC)   Acute on chronic systolic congestive heart failure (HCC)   HCAP (healthcare-associated pneumonia)   Dysphagia as late effect of cerebrovascular disease   Tobacco abuse   Tachypnea   Essential hypertension   Type 2 diabetes mellitus with complication (HCC)   Leukocytosis    Patient Profile:   Primary Cardiologist: New - Dr. Antoine Poche  65 yo male admitted on 09/28/2015 for acute anterior MI with cardiac arrest. S/p DES to LAD on 09/28/2015. Echo this admission shows EF of 30-35%.  Subjective   Patient denies any chest pain or shortness of breath at this time. He fell out of bed last night and suffered a facial contusion. CT Head showed an evolving small, subacute posterior right infarct with no evidence of new intracranial abnormality. Speech is following and are recommending puree and pudding thick liquids at this time.   Has been approved for Inpatient Rehab.  Inpatient Medications    . antiseptic oral rinse  7 mL Mouth Rinse q12n4p  . aspirin  81 mg Per Tube Daily  . atorvastatin  80 mg Oral q1800  . carvedilol  6.25 mg Per Tube BID WC  .  ceFAZolin (ANCEF) IV  2 g Intravenous 3 times per day  . chlorhexidine  15 mL Mouth Rinse BID  . heparin subcutaneous  5,000 Units Subcutaneous 3 times per day  . insulin aspart  0-20 Units Subcutaneous 6 times per day  . insulin glargine  10 Units Subcutaneous QHS  . lisinopril  2.5 mg Oral Daily  . pantoprazole sodium  40 mg Per Tube  Daily  . sennosides  5 mL Oral BID  . sodium chloride flush  3 mL Intravenous Q12H  . spironolactone  12.5 mg Oral Daily  . ticagrelor  90 mg Oral BID    Vital Signs    Filed Vitals:   10/15/15 0445 10/15/15 0500 10/15/15 0802 10/15/15 1150  BP: 163/88  154/90 132/88  Pulse: 79  82 91  Temp: 98.5 F (36.9 C)  98.4 F (36.9 C) 97.9 F (36.6 C)  TempSrc:   Oral Oral  Resp: 21  23 20   Height:      Weight:  127 lb (57.607 kg)    SpO2: 99%  99% 98%    Intake/Output Summary (Last 24 hours) at 10/15/15 1602 Last data filed at 10/15/15 1259  Gross per 24 hour  Intake    360 ml  Output    300 ml  Net     60 ml   Filed Weights   10/13/15 0324 10/14/15 0317 10/15/15 0500  Weight: 135 lb 12.9 oz (61.6 kg) 140 lb 3.4 oz (63.6 kg) 127 lb (57.607 kg)    Physical Exam    General: Frail appearing African American male appearing in no acute distress. Head: Normocephalic, atraumatic.  Neck: Supple without bruits, JVD not elevated. Lungs:  Resp regular and unlabored, Coarse breath sounds with scattered rhonchi, no crackles Heart: RRR, S1, S2, no  S3, S4, or murmur; no rub. Abdomen: Soft, non-tender, non-distended with normoactive bowel sounds. No hepatomegaly. No rebound/guarding. No obvious abdominal masses. Extremities: No clubbing, cyanosis, or edema. Distal pedal pulses are 2+ bilaterally. Neuro: Alert and oriented X 3. Moves all extremities spontaneously. Psych: Normal affect.  Labs    CBC  Recent Labs  10/13/15 0341 10/14/15 0308  WBC 19.8* 17.8*  HGB 8.4* 9.4*  HCT 28.4* 31.4*  MCV 81.1 81.1  PLT 712* 811*   Basic Metabolic Panel  Recent Labs  10/13/15 0341 10/14/15 0308 10/15/15 0330  NA 145 145 148*  K 3.4* 3.8 3.3*  CL 99* 105 106  CO2 32 27 28  GLUCOSE 206* 163* 95  BUN 40* 37* 41*  CREATININE 1.09 1.03 1.10  CALCIUM 9.1 9.2 9.1  MG 2.4 2.7*  --   PHOS 3.2 3.2 3.8   Liver Function Tests  Recent Labs  10/14/15 0308 10/15/15 0330  ALBUMIN 2.9*  3.1*   Fasting Lipid Panel No results for input(s): CHOL, HDL, LDLCALC, TRIG, CHOLHDL, LDLDIRECT in the last 72 hours. Thyroid Function Tests No results for input(s): TSH, T4TOTAL, T3FREE, THYROIDAB in the last 72 hours.  Invalid input(s): FREET3  Telemetry    NSR, HR in 80's. No atopic events.  ECG    No new tracings.   Cardiac Studies and Radiology    Mr Brain Wo Contrast: 10/05/2015  CLINICAL DATA:  Cardiac arrest, respiratory failure. Ventilated patient, not moving LEFT arm. Evaluate hemiplegia. EXAM: MRI HEAD WITHOUT CONTRAST TECHNIQUE: Multiplanar, multiecho pulse sequences of the brain and surrounding structures were obtained without intravenous contrast. COMPARISON:  CT head October 04, 2015 0304 hours FINDINGS: Focal 13 x 9 mm area of reduced diffusion LEFT posterior frontal deep white matter, with corresponding low ADC value. No susceptibility artifact to suggest hemorrhage. Intermediate FLAIR T2 hyperintense signal along RIGHT temporal horn. A few scattered supratentorial subcentimeter white matter FLAIR T2 hyperintensities. Tiny old cerebellar infarcts. Small LEFT caudate head lacunar infarct. No abnormal extra-axial fluid collections. Normal major intracranial vascular flow voids present at skull base. Status post RIGHT ocular lens implant in mild susceptibility artifact, likely representing glaucoma drainage implant. No abnormal sellar expansion. No cerebellar tonsillar ectopia. No suspicious calvarial bone marrow signal. Life-support lines in place. Mild paranasal sinus mucosal thickening with bilateral mastoid effusions. IMPRESSION: Acute small RIGHT for frontal lobe white matter infarct. Mild white matter changes compatible with chronic small vessel ischemic disease. 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 Abnormal RIGHT temporal periventricular signal favoring old ischemia and gliosis though somewhat atypical in location. Recommend follow-up MRI of the brain with  contrast on a nonemergent basis. Electronically Signed   By: Awilda Metro M.D.   On: 10/05/2015 00:54   Dg Chest Port 1 View: 10/12/2015  CLINICAL DATA:  Shortness of breath, cardiac arrest, acute respiratory failure with intubation. EXAM: PORTABLE CHEST 1 VIEW COMPARISON:  Portable chest x-ray of October 11, 2015 FINDINGS: The lungs are well-expanded. The interstitial markings remain increased but have improved somewhat since yesterday's study. The cardiac silhouette is top-normal in size. The central pulmonary vascularity is prominent. Cephalization has decreased. The mediastinum is normal in width. There is mild tortuosity of the descending thoracic aorta. The endotracheal tube tip lies approximately 7 cm above the carina. The esophagogastric tube tip projects below the inferior margin of the image. The left internal jugular venous catheter tip projects at the junction of the right and left brachiocephalic veins. IMPRESSION: Slight further interval  improvement in the pulmonary interstitium consistent with resolving CHF. There is no alveolar pneumonia. The support tubes are in stable position. The endotracheal tube should be advanced approximately 3 cm as recommended on yesterday's study. Electronically Signed   By: David  [place] M.D.   On: 10/12/2015 07:27   Dg Abd Portable 1v: 10/13/2015  CLINICAL DATA:  NG tube advancement. EXAM: PORTABLE ABDOMEN - 1 VIEW COMPARISON:  10/13/2015 at 2152 hours FINDINGS: The enteric tube has been advanced, with tip and side hole now overlying the stomach, both well beyond the GE junction. A moderate amount of colonic stool is noted. There is no evidence of bowel obstruction. IMPRESSION: Interval advancement of enteric as above. Electronically Signed   By: Sebastian Ache M.D.   On: 10/13/2015 23:04   Echocardiogram: 10/06/2015 Study Conclusions - Left ventricle: The cavity size was mildly dilated. There was  mild concentric hypertrophy. Systolic function was moderately  to  severely reduced. The estimated ejection fraction was in the  range of 30% to 35%. Doppler parameters are consistent with  abnormal left ventricular relaxation (grade 1 diastolic  dysfunction). - Regional wall motion abnormality: Hypokinesis of the mid  anteroseptal, basal-mid anterolateral, apical septal, apical  lateral, and apical myocardium. - Aortic valve: Transvalvular velocity was within the normal range.  There was no stenosis. There was no regurgitation. - Mitral valve: Transvalvular velocity was within the normal range.  There was no evidence for stenosis. There was trivial  regurgitation. - Right ventricle: The cavity size was normal. Wall thickness was  normal. Systolic function was normal. - Atrial septum: No defect or patent foramen ovale was identified  by color flow Doppler. - Tricuspid valve: There was trivial regurgitation.  Assessment & Plan    1. Anterior STEMI - presented with an acute anterior MI with cardiac arrest on 09/28/2015. S/p DES to LAD at that time. Repeat cath on 10/08/2015 due to ST elevation showed patent stent.   - denies any current anginal symptoms. - Continue ASA, Brilinta, high-dose atorvastatin, and BB.  2. Cardiac arrest - Patient has been extubated. CCM signed off on 10/13/2015.  3. Acute systolic heart failure -  LVEF 30-35% by echo this admission.  - does not appear volume overloaded on physical exam. - continue BB and ACE-I.  4. CVA - Possibly cardioembolic. Patient will be continued on aspirin and brilinta for now. Plans noted to start warfarin in one month in addition to Plavix. He will need to be transitioned from brilinta to Plavix prior to initiation of warfarin. - Speech is following and are recommending puree and pudding thick liquids at this time.   5. Acute kidney injury - creatinine improved to 1.10 on 10/15/2015.  6. Acute respiratory failure - Suspected pneumonia. Has completed 10-day course of Ancef.  7.  Anemia  - Multifactorial, no active bleeding. - Hgb stable at 9.4 on 10/14/2015.   Has been approved and will be transferred to Inpatient Rehab later today.  Lorri Frederick , PA-C 4:02 PM 10/15/2015 Pager: [phone]  Patient seen, examined. Available data reviewed. Agree with findings, assessment, and plan as outlined by Randall An, PA-C. Patient is independently interviewed and examined. He is making slow progress, but I agree he is medically stable to transfer to the [facility] inpatient rehabilitation. He fell out of bed last night and has a sitter at the bedside. He was not really hurt. His medical program includes aspirin, Brilinta, a beta blocker, and a high intensity statin drug. Outpatient follow-up  will be arranged.  Tonny Bollman, M.D. 10/15/2015 4:29 PM

## 2015-10-15 NOTE — Progress Notes (Addendum)
Transferred to 4 west room 18 by bed, stable, report given to RN, belongings with pt. Sitter with the pt. Wife made aware about the transfer.

## 2015-10-15 NOTE — H&P (View-Only) (Signed)
Physical Medicine and Rehabilitation Admission H&P    CC:  Left sided weakness, dysphagia and balance deficits   HPI: Eddie [name] is a 65 y.o. male who was admitted on 09/28/15 with chest pain with ST elevation due to anterior STEMI and developed unresponsiveness enroute to ED. He was intubated and treated with brief compressions and epi for PEA. He was taken to cath lab urgently and DES placed to LAD. 2D echo with EF 10-15%. He was treated with hypothermia protocol and during rewarming was noted to have agitation as well as left sided weakness on 03/13. 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 He developed copious oral secretions due to MSSA HCAP and was started on Vanc/Fortaz for treatment. Repeat echo with EF 30-35% with diffuse hypokinesis and no PFO or thrombus. EEG ordered due to fluctuating MS and showed generalized background slowing due to "pharmacologic, hypoxic, toxic-metabolic or other diffuse physiologic etiology". Treated with ARDS protocol and tolerated extubation on 03/21.   Dr. Erlinda Hong felt that stroke possibly cardio-embolic. Patient to continue ASA/Brilinta for now and start warfarin/plavix in about a month.  Acute systolic failure treated with Ace, coreg and aldactone--question LiveVest depending on cognitive recovery per Dr. Aundra Dubin.  MBS done and patient started on dysphagia one pudding liquids. He sustained a fall last pm and follow up CT head with evolving infarct. Patient with resultant Left sided weakness, poor posture and cognitive deficits. CIR recommended for follow up therapy.    Review of Systems  HENT: Negative for hearing loss.   Eyes: Negative for blurred vision and double vision.  Respiratory: Negative for cough and shortness of breath.   Cardiovascular: Negative for chest pain and palpitations.    Gastrointestinal: Positive for abdominal pain. Negative for heartburn and nausea.  Genitourinary: Negative for dysuria and urgency.  Musculoskeletal: Negative for myalgias and neck pain.  Skin: Positive for rash.  Neurological: Positive for speech change and weakness. Negative for dizziness and headaches.  Psychiatric/Behavioral: Positive for memory loss. Negative for depression. The patient is not nervous/anxious and does not have insomnia.       Past Medical History  Diagnosis Date  . PUD (peptic ulcer disease)      Past Surgical History  Procedure Laterality Date  . Repair of peptic ulcer    . Cardiac catheterization N/A 09/28/2015    Procedure: Left Heart Cath and Coronary Angiography;  Surgeon: Lorretta Harp, MD;  Location: [facility];  Service: Cardiovascular;  Laterality: N/A;  . Cardiac catheterization N/A 10/08/2015    Procedure: Left Heart Cath and Coronary Angiography;  Surgeon: Belva Crome, MD;  Location: [facility];  Service: Cardiovascular;  Laterality: N/A;    History reviewed. No pertinent family history.    Social History:  Married. 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 Marland Kitchen He does not have any smokeless tobacco history on file. He denies s alcohol or drug use.    Allergies: No Known Allergies    Medications Prior to Admission  Medication Sig Dispense Refill  . Famotidine (PEPCID AC PO) Take 1 tablet by mouth 2 (two) times daily.      Home: Home Living Family/patient expects to be discharged to:: Private residence  Living Arrangements: Spouse/significant other Available Help at Discharge: Family Type of Home: House Home Access: Stairs to enter Technical brewer of Steps: 1 Home Equipment: None Additional Comments: works at [employer] with wife    Functional History: Prior Function Level of Independence:  Independent  Functional Status:  Mobility: Bed Mobility Overal bed mobility: Needs Assistance Bed Mobility: Supine to Sit Supine to sit: Min guard Sit to supine: Mod assist General bed mobility comments: pt able to exit bed on L side with cues to complete task. pt able to static sit EOB without (A)  Transfers Overall transfer level: Needs assistance Equipment used: 2 person hand held assist Transfers: Sit to/from Stand Sit to Stand: +2 physical assistance, Min assist Stand pivot transfers: +2 physical assistance, Mod assist General transfer comment: Pt requires (A) for balance immediately upon standing. Pt with posterior L lean initially. pt with weight shift task to help with awareness in space. Ambulation/Gait Ambulation/Gait assistance: +2 physical assistance, Mod assist Ambulation Distance (Feet): 50 Feet Assistive device: 2 person hand held assist (wrap around support with gait belt) Gait Pattern/deviations: Step-to pattern, Decreased stride length, Ataxic, Trunk flexed, Staggering left, Staggering right, Narrow base of support, Scissoring General Gait Details: patient with poor coordination and difficulty sequencing steps. Max facilitation for weight shifts and tactile as well as visual cues for placement of LLE during loading response Gait velocity: decreased    ADL: ADL Overall ADL's : Needs assistance/impaired  Cognition: Cognition Overall Cognitive Status: Impaired/Different from baseline Orientation Level: Oriented to place, Oriented to person, Disoriented to time, Disoriented to situation Cognition Arousal/Alertness: Awake/alert Behavior During Therapy: WFL for tasks assessed/performed Overall Cognitive Status: Impaired/Different from baseline Area of Impairment: Orientation, Attention, Memory, Following commands, Safety/judgement, Problem solving, Awareness Orientation Level: Disoriented to, Time Current Attention Level: Selective Memory: Decreased short-term  memory Following Commands: Follows multi-step commands with increased time Safety/Judgement: Decreased awareness of safety, Decreased awareness of deficits Awareness: Intellectual Problem Solving: Slow processing, Difficulty sequencing General Comments: Pt able to verbalize stroke and deficits on L side. pt recalled someone visiting yesterday about therapy but describes it as "outpatient" pt with no recall of fall in PM hours ( or is choosing not to report it when challenged with questioning) pt demonstrates poor recall during balanace / basic transfer in hallway with room number. pt educated room [id number]. pt states it back to therapist. pt reading next room number in hall. Pt asked is this your room. pt states "yes " and used visual cues of unfamiliar visitors in room and family verbalized "no we dont know him" to decide not to enter the room. Pt completed this same sequence at the next room. Pt choose the third room as his room because no one was present in the room and guessing.    Blood pressure 132/88, pulse 91, temperature 97.9 F (36.6 C), temperature source Oral, resp. rate 20, height '5\' 7"'$  (1.702 m), weight 57.607 kg (127 lb), SpO2 98 %. Physical Exam  Nursing note and vitals reviewed. Constitutional: He is oriented to person, place, and time. He appears well-developed and well-nourished.  HENT:  Head: Normocephalic and atraumatic.  Mouth/Throat: Oropharynx is clear and moist. Abnormal dentition.  NGT in place, poor dentition. Only 4 remaining teeth which are decayed  Eyes: Conjunctivae are normal. Pupils are equal, round, and reactive to light.  Neck: Normal range of motion. Neck supple.  Cardiovascular: Normal rate and regular rhythm.   No murmur heard. Respiratory: Effort normal and breath sounds normal. No respiratory distress.  He has no wheezes.  Occasional stridor heard.   GI: Soft. Bowel sounds are normal. He exhibits no distension. There is no tenderness.  Genitourinary:   Condom cath in place.   Musculoskeletal: He exhibits no edema or tenderness.  Neurological: He is alert and oriented to person, place, and time.  Kneeling forward in the middle of bed. Turned around and laid on back when I cued him. Oriented to [place] simple commands. redirectible Hoarse voice. Motor 4/5 rightl upper ext and lower exts. Left upper/lower grossly 3 to 3+.  Senses pain and light touch in all 4's. Fair oro-motor control. Speech slightly dysarthric  DTRs symmetric   Skin: Skin is warm and dry.  Psychiatric: His affect is blunt. His speech is delayed. He is slowed.    Results for orders placed or performed during the hospital encounter of 09/28/15 (from the past 48 hour(s))  Glucose, capillary     Status: Abnormal   Collection Time: 10/13/15  4:06 PM  Result Value Ref Range   Glucose-Capillary 113 (H) 65 - 99 mg/dL   Comment 1 Capillary Specimen   Glucose, capillary     Status: Abnormal   Collection Time: 10/13/15  8:05 PM  Result Value Ref Range   Glucose-Capillary 173 (H) 65 - 99 mg/dL   Comment 1 Capillary Specimen   Glucose, capillary     Status: Abnormal   Collection Time: 10/13/15 11:54 PM  Result Value Ref Range   Glucose-Capillary 176 (H) 65 - 99 mg/dL   Comment 1 Capillary Specimen   Renal function panel     Status: Abnormal   Collection Time: 10/14/15  3:08 AM  Result Value Ref Range   Sodium 145 135 - 145 mmol/L   Potassium 3.8 3.5 - 5.1 mmol/L   Chloride 105 101 - 111 mmol/L   CO2 27 22 - 32 mmol/L   Glucose, Bld 163 (H) 65 - 99 mg/dL   BUN 37 (H) 6 - 20 mg/dL   Creatinine, Ser 1.03 0.61 - 1.24 mg/dL   Calcium 9.2 8.9 - 10.3 mg/dL   Phosphorus 3.2 2.5 - 4.6 mg/dL   Albumin 2.9 (L) 3.5 - 5.0 g/dL   GFR calc non Af Amer >60 >60 mL/min   GFR calc Af Amer >60 >60 mL/min    Comment: (NOTE) The eGFR has been calculated using the CKD EPI equation. This calculation has not been validated in all clinical situations. eGFR's  persistently <60 mL/min signify possible Chronic Kidney Disease.    Anion gap 13 5 - 15  CBC     Status: Abnormal   Collection Time: 10/14/15  3:08 AM  Result Value Ref Range   WBC 17.8 (H) 4.0 - 10.5 K/uL   RBC 3.87 (L) 4.22 - 5.81 MIL/uL   Hemoglobin 9.4 (L) 13.0 - 17.0 g/dL   HCT 31.4 (L) 39.0 - 52.0 %   MCV 81.1 78.0 - 100.0 fL   MCH 24.3 (L) 26.0 - 34.0 pg   MCHC 29.9 (L) 30.0 - 36.0 g/dL   RDW 14.9 11.5 - 15.5 %   Platelets 811 (H) 150 - 400 K/uL  Magnesium     Status: Abnormal   Collection Time: 10/14/15  3:08 AM  Result Value Ref Range   Magnesium 2.7 (H) 1.7 - 2.4 mg/dL  Glucose, capillary     Status: Abnormal   Collection Time: 10/14/15  3:15 AM  Result Value Ref Range   Glucose-Capillary 160 (H) 65 - 99 mg/dL  Comment 1 Capillary Specimen   Glucose, capillary     Status: Abnormal   Collection Time: 10/14/15  8:16 AM  Result Value Ref Range   Glucose-Capillary 141 (H) 65 - 99 mg/dL  Glucose, capillary     Status: Abnormal   Collection Time: 10/14/15 11:51 AM  Result Value Ref Range   Glucose-Capillary 156 (H) 65 - 99 mg/dL   Comment 1 Venous Specimen   Glucose, capillary     Status: None   Collection Time: 10/14/15  5:12 PM  Result Value Ref Range   Glucose-Capillary 88 65 - 99 mg/dL   Comment 1 Capillary Specimen    Comment 2 Notify RN   Glucose, capillary     Status: Abnormal   Collection Time: 10/14/15  8:58 PM  Result Value Ref Range   Glucose-Capillary 134 (H) 65 - 99 mg/dL   Comment 1 Capillary Specimen   Glucose, capillary     Status: None   Collection Time: 10/15/15 12:43 AM  Result Value Ref Range   Glucose-Capillary 93 65 - 99 mg/dL  Renal function panel     Status: Abnormal   Collection Time: 10/15/15  3:30 AM  Result Value Ref Range   Sodium 148 (H) 135 - 145 mmol/L   Potassium 3.3 (L) 3.5 - 5.1 mmol/L   Chloride 106 101 - 111 mmol/L   CO2 28 22 - 32 mmol/L   Glucose, Bld 95 65 - 99 mg/dL   BUN 41 (H) 6 - 20 mg/dL   Creatinine, Ser 1.10  0.61 - 1.24 mg/dL   Calcium 9.1 8.9 - 10.3 mg/dL   Phosphorus 3.8 2.5 - 4.6 mg/dL   Albumin 3.1 (L) 3.5 - 5.0 g/dL   GFR calc non Af Amer >60 >60 mL/min   GFR calc Af Amer >60 >60 mL/min    Comment: (NOTE) The eGFR has been calculated using the CKD EPI equation. This calculation has not been validated in all clinical situations. eGFR's persistently <60 mL/min signify possible Chronic Kidney Disease.    Anion gap 14 5 - 15  Glucose, capillary     Status: None   Collection Time: 10/15/15  4:51 AM  Result Value Ref Range   Glucose-Capillary 99 65 - 99 mg/dL  Glucose, capillary     Status: Abnormal   Collection Time: 10/15/15  8:00 AM  Result Value Ref Range   Glucose-Capillary 127 (H) 65 - 99 mg/dL   Comment 1 Document in Chart   Glucose, capillary     Status: Abnormal   Collection Time: 10/15/15 12:16 PM  Result Value Ref Range   Glucose-Capillary 176 (H) 65 - 99 mg/dL   Comment 1 Document in Chart    Ct Head Wo Contrast  10/14/2015  CLINICAL DATA:  Fall.  Initial encounter. EXAM: CT HEAD WITHOUT CONTRAST TECHNIQUE: Contiguous axial images were obtained from the base of the skull through the vertex without intravenous contrast. COMPARISON:  Head CTA 10/05/2015 and MRI 10/04/2015 FINDINGS: A small focus of hypoattenuation is again seen in the the posterior right frontal white matter/ centrum semiovale corresponding to the infarct demonstrated earlier this month. There is no evidence of new infarct, intracranial hemorrhage, mass, midline shift, or extra-axial fluid collection. Ventricles and sulci are within normal limits for age. Postoperative changes to the right globe. Mild mucosal thickening in the sphenoid sinuses. Trace left mastoid effusion. Mild calcified atherosclerosis at the skullbase. No skull fracture identified. IMPRESSION: 1. Evolving, small, subacute posterior right frontal infarct. 2. No  evidence of new intracranial abnormality. Electronically Signed   By: Logan Bores M.D.    On: 10/14/2015 23:50   Dg Abd Portable 1v  10/14/2015  CLINICAL DATA:  Check feeding catheter placement EXAM: PORTABLE ABDOMEN - 1 VIEW COMPARISON:  10/13/2015 FINDINGS: The nasogastric catheter has been exchanged for feeding catheter which now lies at the level of the lower channel and possibly in the proximal duodenum. Scattered large and small bowel gas is again identified. No obstructive changes are seen. No acute bony abnormality is seen. IMPRESSION: Feeding catheter within the distal stomach Electronically Signed   By: Inez Catalina M.D.   On: 10/14/2015 14:32     Medical Problem List and Plan: 1.  Cognitive, mobility, and functional deficits secondary to anoxic encephalopathy and right frontal infarct 2.  DVT Prophylaxis/Anticoagulation: Pharmaceutical: Lovenox 3. Pain Management: tylenol prn 4. Mood: LCSW to follow for evaluation and support.  5. Neuropsych: This patient is not fully capable of making decisions on his own behalf. 6. Skin/Wound Care: Routine pressure relief measure.  7. Fluids/Electrolytes/Nutrition: Monitor I/O. Check lytes in am.  8. Anterior STEMI: to continue ASA, Brilinta, BB and statin. 9. Acute systolic heart failure: Has been compensated. Check daily weights and monitor for signs of overload.  Continue aldactone, coreg and lisinopril.  10. 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 Neurology recommends coumadin plus plavix in one month.  11. MSSA PNA: IV ancef discontinued today--has completed 12 day antibiotic regimen.   -pt is an aspiration risk--observe aspiration precautions 12. Acute on chronic renal failure: Diuresis discontinued with improvement. Continue to monitor as on modified diet without liquids.  May need IVF at nights to maintain adequate hydration.  13. Anemia: Slowly improving. Continue to monitor for signs of infection. Check CBC in am.  14. DM type 2: New diagnosis with Hgb A1C-6.9. BS uncontrolled due to tube feeds. Continue lantus daily and will transition  to oral medication as intake improves.  Monitor BS with ac/hs checks and use SSI for elevated BS.     Post Admission Physician Evaluation: 1. Functional deficits secondary  to right frontal infarct and anoxic encephalopathy. 2. Patient is admitted to receive collaborative, interdisciplinary care between the physiatrist, rehab nursing staff, and therapy team. 3. Patient's level of medical complexity and substantial therapy needs in context of that medical necessity cannot be provided at a lesser intensity of care such as a SNF. 4. Patient has experienced substantial functional loss from his/her baseline which was documented above under the "Functional History" and "Functional Status" headings.  Judging by the patient's diagnosis, physical exam, and functional history, the patient has potential for functional progress which will result in measurable gains while on inpatient rehab.  These gains will be of substantial and practical use upon discharge  in facilitating mobility and self-care at the household level. 5. Physiatrist will provide 24 hour management of medical needs as well as oversight of the therapy plan/treatment and provide guidance as appropriate regarding the interaction of the two. 6. 24 hour rehab nursing will assist with bladder management, bowel management, safety, skin/wound care, disease management, medication administration, pain management and patient education  and help integrate therapy concepts, techniques,education, etc. 7. PT will assess and treat for/with: Lower extremity strength, range of motion, stamina, balance, functional mobility, safety, adaptive techniques and equipment, NMR, cognitive-behavioral mgt, family education, community reintegration.   Goals are: supervision. 8. OT will assess and treat for/with: ADL's, functional mobility, safety, upper extremity strength, adaptive techniques and equipment, NMR, cognitive-behavioral mgt, family education,  ego support.   Goals  are: supervision. Therapy may not yet proceed with showering this patient. 9. SLP will assess and treat for/with: cognition, swallowing, communication, behavior.  Goals are: supervision to min assist. 10. Case Management and Social Worker will assess and treat for psychological issues and discharge planning. 11. Team conference will be held weekly to assess progress toward goals and to determine barriers to discharge. 12. Patient will receive at least 3 hours of therapy per day at least 5 days per week. 13. ELOS: 13-18 days       14. Prognosis:  excellent     Meredith Staggers, MD, [facility] 10/15/2015   10/15/2015

## 2015-10-15 NOTE — Progress Notes (Signed)
Rehab admissions - I have approval for acute inpatient rehab for today.  Bed available and will admit to inpatient rehab today.  Call me for questions.  #[phone]

## 2015-10-16 ENCOUNTER — Inpatient Hospital Stay (HOSPITAL_COMMUNITY): Payer: BLUE CROSS/BLUE SHIELD | Admitting: Physical Therapy

## 2015-10-16 ENCOUNTER — Inpatient Hospital Stay (HOSPITAL_COMMUNITY): Payer: BLUE CROSS/BLUE SHIELD

## 2015-10-16 ENCOUNTER — Inpatient Hospital Stay (HOSPITAL_COMMUNITY): Payer: BLUE CROSS/BLUE SHIELD | Admitting: Speech Pathology

## 2015-10-16 DIAGNOSIS — I69991 Dysphagia following unspecified cerebrovascular disease: Secondary | ICD-10-CM

## 2015-10-16 DIAGNOSIS — I639 Cerebral infarction, unspecified: Secondary | ICD-10-CM

## 2015-10-16 DIAGNOSIS — G931 Anoxic brain damage, not elsewhere classified: Secondary | ICD-10-CM

## 2015-10-16 DIAGNOSIS — I10 Essential (primary) hypertension: Secondary | ICD-10-CM

## 2015-10-16 LAB — COMPREHENSIVE METABOLIC PANEL
ALK PHOS: 100 U/L (ref 38–126)
ALT: 9 U/L — AB (ref 17–63)
AST: 27 U/L (ref 15–41)
Albumin: 3 g/dL — ABNORMAL LOW (ref 3.5–5.0)
Anion gap: 14 (ref 5–15)
BUN: 43 mg/dL — AB (ref 6–20)
CO2: 24 mmol/L (ref 22–32)
CREATININE: 1.21 mg/dL (ref 0.61–1.24)
Calcium: 8.9 mg/dL (ref 8.9–10.3)
Chloride: 107 mmol/L (ref 101–111)
Glucose, Bld: 162 mg/dL — ABNORMAL HIGH (ref 65–99)
Potassium: 3.4 mmol/L — ABNORMAL LOW (ref 3.5–5.1)
SODIUM: 145 mmol/L (ref 135–145)
TOTAL PROTEIN: 8.1 g/dL (ref 6.5–8.1)
Total Bilirubin: 0.7 mg/dL (ref 0.3–1.2)

## 2015-10-16 LAB — CBC WITH DIFFERENTIAL/PLATELET
Basophils Absolute: 0 10*3/uL (ref 0.0–0.1)
Basophils Relative: 0 %
Eosinophils Absolute: 0 10*3/uL (ref 0.0–0.7)
Eosinophils Relative: 0 %
HEMATOCRIT: 32.8 % — AB (ref 39.0–52.0)
HEMOGLOBIN: 9.9 g/dL — AB (ref 13.0–17.0)
LYMPHS ABS: 1.1 10*3/uL (ref 0.7–4.0)
LYMPHS PCT: 8 %
MCH: 24.7 pg — AB (ref 26.0–34.0)
MCHC: 30.2 g/dL (ref 30.0–36.0)
MCV: 81.8 fL (ref 78.0–100.0)
Monocytes Absolute: 0.4 10*3/uL (ref 0.1–1.0)
Monocytes Relative: 3 %
NEUTROS PCT: 89 %
Neutro Abs: 12.5 10*3/uL — ABNORMAL HIGH (ref 1.7–7.7)
Platelets: 722 10*3/uL — ABNORMAL HIGH (ref 150–400)
RBC: 4.01 MIL/uL — AB (ref 4.22–5.81)
RDW: 15.1 % (ref 11.5–15.5)
WBC: 14.1 10*3/uL — AB (ref 4.0–10.5)

## 2015-10-16 LAB — GLUCOSE, CAPILLARY
GLUCOSE-CAPILLARY: 196 mg/dL — AB (ref 65–99)
Glucose-Capillary: 199 mg/dL — ABNORMAL HIGH (ref 65–99)

## 2015-10-16 MED ORDER — NITROGLYCERIN 0.4 MG SL SUBL
SUBLINGUAL_TABLET | SUBLINGUAL | Status: AC
  Filled 2015-10-16: qty 1

## 2015-10-16 MED ORDER — CARVEDILOL 6.25 MG PO TABS
6.2500 mg | ORAL_TABLET | Freq: Once | ORAL | Status: AC
  Administered 2015-10-16: 6.25 mg
  Filled 2015-10-16: qty 1

## 2015-10-16 MED ORDER — NITROGLYCERIN 0.4 MG SL SUBL
0.4000 mg | SUBLINGUAL_TABLET | SUBLINGUAL | Status: DC | PRN
  Administered 2015-10-16: 0.4 mg via SUBLINGUAL

## 2015-10-16 MED ORDER — INSULIN ASPART 100 UNIT/ML ~~LOC~~ SOLN
0.0000 [IU] | Freq: Three times a day (TID) | SUBCUTANEOUS | Status: DC
  Administered 2015-10-17: 11 [IU] via SUBCUTANEOUS
  Administered 2015-10-17: 15 [IU] via SUBCUTANEOUS
  Administered 2015-10-18: 11 [IU] via SUBCUTANEOUS
  Administered 2015-10-18: 4 [IU] via SUBCUTANEOUS
  Administered 2015-10-19: 3 [IU] via SUBCUTANEOUS
  Administered 2015-10-19: 4 [IU] via SUBCUTANEOUS
  Administered 2015-10-19: 7 [IU] via SUBCUTANEOUS
  Administered 2015-10-20: 4 [IU] via SUBCUTANEOUS
  Administered 2015-10-20: 7 [IU] via SUBCUTANEOUS
  Administered 2015-10-21: 3 [IU] via SUBCUTANEOUS
  Administered 2015-10-21: 7 [IU] via SUBCUTANEOUS
  Administered 2015-10-22 (×2): 4 [IU] via SUBCUTANEOUS
  Administered 2015-10-23: 7 [IU] via SUBCUTANEOUS
  Administered 2015-10-23: 3 [IU] via SUBCUTANEOUS
  Administered 2015-10-23 – 2015-10-24 (×2): 4 [IU] via SUBCUTANEOUS

## 2015-10-16 NOTE — Progress Notes (Signed)
Patient was sitting at the side of the vail bed with NT when RN came in the room. Patient was calm. He c/o chest pain 8/10 and he claimed that it started after the fall but did not mention it to the staff. Vitals were 98.1, 140/99 89 99% ra 18. Notified RR and called the MD on call. EKG performed and nitrolgycerin x 1 given with no relief from pain. Patient said that he cannot tell the pain unless he coughs. EKG result tubed to RR in ED as requested. Patient was then transported to radiology for CT of his head at approximately 2125. Called md for result of CT and EKG. Will monitor.

## 2015-10-16 NOTE — Progress Notes (Signed)
10/16/15 1330 Nursing Patient noted to have a cut on his rt eyebrow foam dressing placed; per patient he has no other pain. RN advised wife  to call staff and not to undo quick release belt. MD orders noted.

## 2015-10-16 NOTE — Evaluation (Signed)
Speech Language Pathology Assessment and Plan  Patient Details  Name: Eddie [name] MRN: [id number] Date of Birth: 01/25/1951  SLP Diagnosis: Cognitive Impairments;Dysphagia  Rehab Potential: Fair ELOS: 12 to 15 days    Today's Date: 10/16/2015 SLP Individual Time: 0900-1000 SLP Individual Time Calculation (min): 60 min   Problem List:  Patient Active Problem List   Diagnosis Date Noted  . Ischemic stroke of frontal lobe ([place]) 10/15/2015  . Anoxic encephalopathy ([place]) 10/15/2015  . Right-sided cerebrovascular accident (CVA) ([place])   . Acute on chronic systolic congestive heart failure ([place])   . HCAP (healthcare-associated pneumonia)   . Dysphagia as late effect of cerebrovascular disease   . Tobacco abuse   . Tachypnea   . Essential hypertension   . Type 2 diabetes mellitus with complication ([place])   . Leukocytosis   . Absolute anemia   . Altered mental status   . Hemiplegia ([place])   . Stroke ([place])   . ARDS (adult respiratory distress syndrome) ([place])   . Acute respiratory failure ([place])   . STEMI (ST elevation myocardial infarction) ([place]) 09/28/2015  . Cardiac arrest ([place])   . ST elevation myocardial infarction (STEMI) ([place])   . Encounter for central line placement   . Hypokalemia   . Encephalopathy acute   . Acute hypoxemic respiratory failure [facility])    Past Medical History:  Past Medical History  Diagnosis Date  . PUD (peptic ulcer disease)   . CAD (coronary artery disease)     a. STEMI 09/2015 w/ DES to Prox LAD  . CVA (cerebral infarction)     a. 09/2015: acute small right frontal lobe infarct   Past Surgical History:  Past Surgical History  Procedure Laterality Date  . Repair of peptic ulcer    . Cardiac catheterization N/A 09/28/2015    Procedure: Left Heart Cath and Coronary Angiography;  Surgeon: Lorretta Harp, MD;  Location: [place] [facility];  Service: Cardiovascular;  Laterality: N/A;  . Cardiac catheterization N/A 10/08/2015    Procedure: Left Heart  Cath and Coronary Angiography;  Surgeon: Belva Crome, MD;  Location: [place] [facility];  Service: Cardiovascular;  Laterality: N/A;    Assessment / Plan / Recommendation Clinical Impression  Eddie [name] is a 65 y.o. male who was admitted on 09/28/15 with chest pain with ST elevation due to anterior STEMI and developed unresponsiveness enroute to ED. He was intubated and treated with brief compressions and epi for PEA. He was taken to cath lab urgently and DES placed to LAD. 2D echo with EF 10-15%. He was treated with hypothermia protocol and during rewarming was noted to have agitation as well as left sided weakness on 03/13. 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 Pt extubated on 10/11/2015 with MBS performed on 10/15/2015. Pt presented with moderate to severe pharyngeal phase dysphagia c/b delayed swallow initiation to pyriform sinuses with resulted in gross silent aspiration of honey and nectar thick liquids. Diet recommendation made for puree textures with pudding thick liquids, medicine crushed in puree and full nursing supervision with PO intake. Pt transferred to CIR on 10/15/2015. Patient's speech is moderately unintelligible at the sentence and simple conversation level secondary to decrease vocal intensity. Pt with ongoing severe swallow deficits characterized by significantly decreased hyolaryngeal elevation and delayed swallow initiation of puree and pudding textures. Pt required max verbal and tactile cues  to implement compensatory swallow strategies and swallow strengthening exercises. Pt's ability level is further complicated by significant cognitive impairments. Pt obtained a 8/30 on the MoCA (version 7.2), with 22 being the average range. Pt with deficits in the areas of sustained attention, short term memory, basic problem solving,  orientation, and safety awareness. Patient would benefit from skilled SLP intervention to maximize speech intelligibility, cognitive function and swallow ability to maximize return to least restrictive diet and increase overall functional independence prior to discharge.   Skilled Therapeutic Interventions          Bedside Swallow Evaluation and cognitive-linguistic evaluation. Please see above for details. Educated pt in regards to his current dysphagia and cognitive-linguistic deficits. Pt verbalized understanding and agreement.   SLP Assessment  Patient will need skilled Speech Lanaguage Pathology Services during CIR admission    Recommendations  SLP Diet Recommendations: Dysphagia 1 (Puree);Pudding Liquid Administration via: Spoon Medication Administration: Crushed with puree Supervision: Staff to assist with self feeding;Full supervision/cueing for compensatory strategies Compensations: Minimize environmental distractions;Slow rate;Small sips/bites;Multiple dry swallows after each bite/sip;Effortful swallow;Clear throat intermittently Postural Changes and/or Swallow Maneuvers: Seated upright 90 degrees Oral Care Recommendations: Oral care BID Patient destination: Home Follow up Recommendations: Home Health SLP Equipment Recommended: To be determined    SLP Frequency 3 to 5 out of 7 days   SLP Duration  SLP Intensity  SLP Treatment/Interventions    Minumum of 1-2 x/day, 30 to 90 minutes  Cognitive remediation/compensation;Cueing hierarchy;Dysphagia/aspiration precaution training;Patient/family education;Oral motor exercises;Therapeutic Exercise;Therapeutic Activities;Speech/Language facilitation;Environmental controls;Functional tasks    Pain Pain Assessment Pain Assessment: No/denies pain  Prior Functioning Cognitive/Linguistic Baseline: Within functional limits Type of Home: House  Lives With: Spouse Available Help at Discharge: Family Education: Highschool  education Vocation: Full time employment  Function:  Eating Eating   Modified Consistency Diet: Yes Eating Assist Level: More than reasonable amount of time;Supervision or verbal cues;Help managing cup/glass           Cognition Comprehension Comprehension assist level: Understands basic 50 - 74% of the time/ requires cueing 25 - 49% of the time  Expression   Expression assist level: Expresses basic 50 - 74% of the time/requires cueing 25 - 49% of the time. Needs to repeat parts of sentences.  Social Interaction Social Interaction assist level: Interacts appropriately 25 - 49% of time - Needs frequent redirection.  Problem Solving Problem solving assist level: Solves basic 25 - 49% of the time - needs direction more than half the time to initiate, plan or complete simple activities  Memory Memory assist level: Recognizes or recalls 25 - 49% of the time/requires cueing 50 - 75% of the time   Short Term Goals: Week 1: SLP Short Term Goal 1 (Week 1): Pt will sustain attion to a basic familiary task for ~3 minute intervals with mod verbal cues for redirection.  SLP Short Term Goal 2 (Week 1): Pt will answer orientation questions with 90% and mod verbal cues. SLP Short Term Goal 3 (Week 1): Pt will demonstrate basic problem solving for familiar tasks with mod verbal cues.  SLP Short Term Goal 4 (Week 1): Pt will complete swallow strengthening exercises to demonstrate readiness for repeat objective swallow assessment.  SLP Short Term Goal 5 (Week 1): Pt will recall swallow deficits, current diet and compenastory strategies with 80% and mod verbal cues.  SLP Short Term Goal 6 (Week 1): Pt will use compensatory swallow strategies with 80% accuracy and mod verbal cues.   Refer to Care Plan  for Long Term Goals  Recommendations for other services: None  Discharge Criteria: Patient will be discharged from SLP if patient refuses treatment 3 consecutive times without medical reason, if treatment  goals not met, if there is a change in medical status, if patient makes no progress towards goals or if patient is discharged from hospital.  The above assessment, treatment plan, treatment alternatives and goals were discussed and mutually agreed upon: by patient  Jailee Jaquez 10/16/2015, 11:36 AM

## 2015-10-16 NOTE — Progress Notes (Signed)
Occupational Therapy Assessment and Plan  Patient Details  Name: Eddie [name] MRN: [id number] Date of Birth: [birth year]  OT Diagnosis: abnormal posture, cognitive deficits and muscle weakness (generalized) Rehab Potential: Rehab Potential (ACUTE ONLY): Good ELOS: 12-15 days   Today's Date: 10/16/2015 OT Individual Time: 0700-0800 OT Individual Time Calculation (min): 60 min     Problem List:  Patient Active Problem List   Diagnosis Date Noted  . Ischemic stroke of frontal lobe ([place]) 10/15/2015  . Anoxic encephalopathy ([place]) 10/15/2015  . Right-sided cerebrovascular accident (CVA) ([place])   . Acute on chronic systolic congestive heart failure ([place])   . HCAP (healthcare-associated pneumonia)   . Dysphagia as late effect of cerebrovascular disease   . Tobacco abuse   . Tachypnea   . Essential hypertension   . Type 2 diabetes mellitus with complication ([place])   . Leukocytosis   . Absolute anemia   . Altered mental status   . Hemiplegia ([place])   . Stroke ([place])   . ARDS (adult respiratory distress syndrome) ([place])   . Acute respiratory failure ([place])   . STEMI (ST elevation myocardial infarction) ([place]) 09/28/2015  . Cardiac arrest ([place])   . ST elevation myocardial infarction (STEMI) ([place])   . Encounter for central line placement   . Hypokalemia   . Encephalopathy acute   . Acute hypoxemic respiratory failure [facility])     Past Medical History:  Past Medical History  Diagnosis Date  . PUD (peptic ulcer disease)   . CAD (coronary artery disease)     a. STEMI 09/2015 w/ DES to Prox LAD  . CVA (cerebral infarction)     a. 09/2015: acute small right frontal lobe infarct   Past Surgical History:  Past Surgical History  Procedure Laterality Date  . Repair of peptic ulcer    . Cardiac catheterization N/A 09/28/2015    Procedure: Left Heart Cath and Coronary Angiography;  Surgeon: Lorretta Harp, MD;  Location: [place] [facility];  Service: Cardiovascular;  Laterality: N/A;  . Cardiac  catheterization N/A 10/08/2015    Procedure: Left Heart Cath and Coronary Angiography;  Surgeon: Belva Crome, MD;  Location: [place] [facility];  Service: Cardiovascular;  Laterality: N/A;    Assessment & Plan Clinical Impression: Eddie [name] is a 65 y.o. male who was admitted on 09/28/15 with chest pain with ST elevation due to anterior STEMI and developed unresponsiveness enroute to ED. He was intubated and treated with brief compressions and epi for PEA. He was taken to cath lab urgently and DES placed to LAD. 2D echo with EF 10-15%. He was treated with hypothermia protocol and during rewarming was noted to have agitation as well as left sided weakness on 03/13. 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 He developed copious oral secretions due to MSSA HCAP and was started on Vanc/Fortaz for treatment. Repeat echo with EF 30-35% with diffuse hypokinesis and no PFO or thrombus. EEG ordered due to fluctuating MS and showed generalized background slowing due to "pharmacologic, hypoxic, toxic-metabolic or other diffuse physiologic etiology". Treated with ARDS protocol and tolerated extubation on 03/21.   Dr. Erlinda Hong felt that stroke possibly cardio-embolic. Patient to continue ASA/Brilinta for now and start warfarin/plavix in about a month.  Acute systolic failure treated with Ace, coreg and aldactone--question LiveVest depending on cognitive recovery per Dr. Aundra Dubin. MBS done and patient started  on dysphagia one pudding liquids. He sustained a fall last pm and follow up CT head with evolving infarct. Patient with resultant Left sided weakness, poor posture and cognitive deficits.  Patient transferred to CIR on 10/15/2015 .    Patient currently requires min with basic self-care skills secondary to muscle weakness, decreased midline orientation and decreased  attention to left, decreased awareness and decreased safety awareness and decreased standing balance, decreased postural control and decreased balance strategies.  Prior to hospitalization, patient could complete BADLs with independent .  Patient will benefit from skilled intervention to increase independence with basic self-care skills prior to discharge home with care partner.  Anticipate patient will require 24 hour supervision and follow-up to be determined.  OT - End of Session Activity Tolerance: Decreased this session Endurance Deficit: Yes OT Assessment Rehab Potential (ACUTE ONLY): Good OT Patient demonstrates impairments in the following area(s): Balance;Behavior;Cognition;Endurance;Motor;Safety;Sensory;Vision OT Basic ADL's Functional Problem(s): Grooming;Bathing;Dressing;Toileting OT Transfers Functional Problem(s): Toilet;Tub/Shower OT Additional Impairment(s): Fuctional Use of Upper Extremity OT Plan OT Intensity: Minimum of 1-2 x/day, 45 to 90 minutes OT Frequency: 5 out of 7 days OT Duration/Estimated Length of Stay: 12-15 days OT Treatment/Interventions: Balance/vestibular training;Cognitive remediation/compensation;Community reintegration;Discharge planning;Neuromuscular re-education;Functional mobility training;Functional electrical stimulation;Patient/family education;Psychosocial support;DME/adaptive equipment instruction;Self Care/advanced ADL retraining;Splinting/orthotics;Visual/perceptual remediation/compensation;UE/LE Coordination activities;UE/LE Strength taining/ROM;Therapeutic Exercise;Therapeutic Activities OT Self Feeding Anticipated Outcome(s): n/a OT Basic Self-Care Anticipated Outcome(s): supervision-Mod I OT Toileting Anticipated Outcome(s): Mod I OT Bathroom Transfers Anticipated Outcome(s): supervision OT Recommendation Patient destination: Home Follow Up Recommendations:  (TBD) Equipment Recommended: To be determined   Skilled Therapeutic  Intervention OT evaluation completed. Discussed role of OT, goals of therapy, possible ELOS, fall risk, and safety plan. Pt received in recliner chair at nurses station. Pt oriented x4 this AM. Engaged in bathing at sink today. Pt required min A for standing balance during self-care tasks as he demonstrated posterior lean with minimal awareness. Pt initiated use of LUE during self-care tasks, however frequently dropped wash cloth. Pt completed 9-hole peg test and dynamometer test.  9 hole peg test: RUE: 0:53 LUE: 1:47 Dynamometer (avg of 3) RUE: 7 kg LUE: 2 kg  OT Evaluation Precautions/Restrictions  Precautions Precautions: Fall Restrictions Weight Bearing Restrictions: No General   Vital Signs  Pain Pain Assessment Pain Assessment: No/denies pain Home Living/Prior Functioning Home Living Available Help at Discharge: Family Type of Home: House Home Access: Stairs to enter Technical brewer of Steps: 1 Home Layout: One level Bathroom Shower/Tub: Government social research officer Accessibility: Yes (per verbal report from patient) Additional Comments: works at [employer] with wife  Prior Function Level of Independence: Independent with basic ADLs, Independent with homemaking with ambulation, Independent with gait, Independent with transfers Vocation: Full time employment ADL   Vision/Perception  Vision- History Baseline Vision/History: Wears glasses Wears Glasses: Reading only Vision- Assessment Vision Assessment?: Yes Eye Alignment: Within Functional Limits Ocular Range of Motion: Within Functional Limits Alignment/Gaze Preference: Within Defined Limits Tracking/Visual Pursuits: Decreased smoothness of horizontal tracking;Requires cues, head turns, or add eye shifts to track Visual Fields: Impaired-to be further tested in functional context Additional Comments: Pt demonstrated scanning deficits during ADLs and peg hole test.  Cognition Overall  Cognitive Status: Impaired/Different from baseline Arousal/Alertness: Awake/alert Orientation Level: Person;Place;Situation Person: Oriented Place: Oriented Situation: Oriented Year: 2017 Month: March Day of Week: Incorrect Memory: Impaired Memory Impairment: Decreased recall of new information Immediate Memory Recall: Sock;Blue;Bed Memory Recall: Bed Memory Recall Bed: With Cue Attention: Selective Selective Attention: Appears intact Awareness: Impaired Awareness Impairment: Anticipatory  impairment;Emergent impairment Problem Solving: Impaired Problem Solving Impairment: Functional basic Behaviors: Impulsive Safety/Judgment: Impaired Sensation Sensation Light Touch: Appears Intact Hot/Cold: Appears Intact Proprioception: Appears Intact Coordination Gross Motor Movements are Fluid and Coordinated: No Fine Motor Movements are Fluid and Coordinated: No Finger Nose Finger Test: decreased strength and coordination with LUE; ataxia Motor  Motor Motor: Ataxia Mobility  Transfers Transfers: Sit to Stand;Stand to Sit Sit to Stand: 4: Min assist Sit to Stand Details: Tactile cues for posture;Verbal cues for technique Stand to Sit: 4: Min assist Stand to Sit Details (indicate cue type and reason): Verbal cues for technique  Trunk/Postural Assessment  Cervical Assessment Cervical Assessment: Within Functional Limits Thoracic Assessment Thoracic Assessment: Within Functional Limits Lumbar Assessment Lumbar Assessment: Within Functional Limits Postural Control Postural Control: Deficits on evaluation Righting Reactions: impaired  Balance Balance Balance Assessed: Yes Static Sitting Balance Static Sitting - Balance Support: Feet supported Static Sitting - Level of Assistance: 7: Independent Dynamic Sitting Balance Dynamic Sitting - Balance Support: Feet supported;During functional activity Dynamic Sitting - Level of Assistance: 5: Stand by assistance Static Standing  Balance Static Standing - Balance Support: Bilateral upper extremity supported Static Standing - Level of Assistance: 4: Min assist Dynamic Standing Balance Dynamic Standing - Balance Support: During functional activity Dynamic Standing - Level of Assistance: 4: Min assist (posterior lean) Extremity/Trunk Assessment RUE Assessment RUE Assessment: Within Functional Limits LUE Assessment LUE Assessment: Exceptions to WFL LUE AROM (degrees) LUE Overall AROM Comments: shoulder flexion limited to grossly 110 degrees; able to oppose all fingers to thumb LUE Strength LUE Overall Strength Comments: 3+/5   See Function Navigator for Current Functional Status.   Refer to Care Plan for Long Term Goals  Recommendations for other services: None  Discharge Criteria: Patient will be discharged from OT if patient refuses treatment 3 consecutive times without medical reason, if treatment goals not met, if there is a change in medical status, if patient makes no progress towards goals or if patient is discharged from hospital.  The above assessment, treatment plan, treatment alternatives and goals were discussed and mutually agreed upon: by patient  Duayne Cal 10/16/2015, 10:35 AM

## 2015-10-16 NOTE — Progress Notes (Signed)
Eddie [name] is a 65 y.o. male 1951/04/26 [id number]  Subjective: Wants ice water or other cold drink - no other complaints or requests - denies pain or other problems.  Objective: Vital signs in last 24 hours: Temp:  [97.4 F (36.3 C)-97.9 F (36.6 C)] 97.4 F (36.3 C) (03/25 0517) Pulse Rate:  [76-91] 76 (03/25 0517) Resp:  [16-21] 16 (03/25 0517) BP: (131-141)/(63-95) 141/95 mmHg (03/25 0517) SpO2:  [94 %-100 %] 100 % (03/25 0517) Weight:  [58.06 kg (128 lb)-58.117 kg (128 lb 2 oz)] 58.117 kg (128 lb 2 oz) (03/25 0500) Weight change:  Last BM Date: 10/15/15  Intake/Output from previous day:    Physical Exam General: No apparent distress   Lying supine, keeps eyes closed but interactive Lungs: Normal effort. Lungs clear to auscultation, no crackles or wheezes. Cardiovascular: Regular rate and rhythm, no edema Neurological: No new neurological deficits   Lab Results: BMET    Component Value Date/Time   NA 145 10/16/2015 0827   K 3.4* 10/16/2015 0827   CL 107 10/16/2015 0827   CO2 24 10/16/2015 0827   GLUCOSE 162* 10/16/2015 0827   BUN 43* 10/16/2015 0827   CREATININE 1.21 10/16/2015 0827   CALCIUM 8.9 10/16/2015 0827   GFRNONAA >60 10/16/2015 0827   GFRAA >60 10/16/2015 0827   CBC    Component Value Date/Time   WBC 14.1* 10/16/2015 0827   RBC 4.01* 10/16/2015 0827   HGB 9.9* 10/16/2015 0827   HCT 32.8* 10/16/2015 0827   PLT 722* 10/16/2015 0827   MCV 81.8 10/16/2015 0827   MCH 24.7* 10/16/2015 0827   MCHC 30.2 10/16/2015 0827   RDW 15.1 10/16/2015 0827   LYMPHSABS 1.1 10/16/2015 0827   MONOABS 0.4 10/16/2015 0827   EOSABS 0.0 10/16/2015 0827   BASOSABS 0.0 10/16/2015 0827   CBG's (last 3):   Recent Labs  10/15/15 1705 10/15/15 2139 10/16/15 0057  GLUCAP 251* 386* 199*   LFT's Lab Results  Component Value Date   ALT 9* 10/16/2015   AST 27 10/16/2015   ALKPHOS 100 10/16/2015   BILITOT 0.7 10/16/2015    Studies/Results: Ct Head Wo  Contrast  10/14/2015  CLINICAL DATA:  Fall.  Initial encounter. EXAM: CT HEAD WITHOUT CONTRAST TECHNIQUE: Contiguous axial images were obtained from the base of the skull through the vertex without intravenous contrast. COMPARISON:  Head CTA 10/05/2015 and MRI 10/04/2015 FINDINGS: A small focus of hypoattenuation is again seen in the the posterior right frontal white matter/ centrum semiovale corresponding to the infarct demonstrated earlier this month. There is no evidence of new infarct, intracranial hemorrhage, mass, midline shift, or extra-axial fluid collection. Ventricles and sulci are within normal limits for age. Postoperative changes to the right globe. Mild mucosal thickening in the sphenoid sinuses. Trace left mastoid effusion. Mild calcified atherosclerosis at the skullbase. No skull fracture identified. IMPRESSION: 1. Evolving, small, subacute posterior right frontal infarct. 2. No evidence of new intracranial abnormality. Electronically Signed   By: Sebastian Ache M.D.   On: 10/14/2015 23:50   Dg Abd Portable 1v  10/14/2015  CLINICAL DATA:  Check feeding catheter placement EXAM: PORTABLE ABDOMEN - 1 VIEW COMPARISON:  10/13/2015 FINDINGS: The nasogastric catheter has been exchanged for feeding catheter which now lies at the level of the lower channel and possibly in the proximal duodenum. Scattered large and small bowel gas is again identified. No obstructive changes are seen. No acute bony abnormality is seen. IMPRESSION: Feeding catheter within the distal stomach Electronically Signed  By: Alcide Clever M.D.   On: 10/14/2015 14:32   Dg Swallowing Func-speech Pathology  10/15/2015  Objective Swallowing Evaluation: Type of Study: MBS-Modified Barium Swallow Study Patient Details Name: Eddie [name] MRN: [id number] Date of Birth: 1951-04-21 Today's Date: 10/15/2015 Time: SLP Start Time (ACUTE ONLY): 1033-SLP Stop Time (ACUTE ONLY): 1049 SLP Time Calculation (min) (ACUTE ONLY): 16 min Past Medical  History: Past Medical History Diagnosis Date . PUD (peptic ulcer disease)  Past Surgical History: Past Surgical History Procedure Laterality Date . Repair of peptic ulcer   . Cardiac catheterization N/A 09/28/2015   Procedure: Left Heart Cath and Coronary Angiography;  Surgeon: Runell Gess, MD;  Location: [facility] INVASIVE CV LAB;  Service: Cardiovascular;  Laterality: N/A; . Cardiac catheterization N/A 10/08/2015   Procedure: Left Heart Cath and Coronary Angiography;  Surgeon: Lyn Records, MD;  Location: [facility] INVASIVE CV LAB;  Service: Cardiovascular;  Laterality: N/A; HPI: 65 year old male admitted 3/7 chest pain, becoming unresponsive shortely before arrival to ED. Patient with cardiac arrest (brief chest compressions),  NSTEMI, intubated. Possible CVA symptoms noted on 3/13. 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 Extubated 3/20.  No Data Recorded Assessment / Plan / Recommendation CHL IP CLINICAL IMPRESSIONS 10/15/2015 Therapy Diagnosis -- Clinical Impression MBS complete. Pt exhibited mod-severe pharyngeal phase dysphagia characterized by sensory > motor deficits. Delayed swallow intiation to the pyriform sinuses, due to impaired sensation, culminated in gross silent aspiration of honey and nectar thick liquids during the swallow. Min-mod pharyngeal residue in vallecula and pyriform sinuses observed across all consistencies as a result of decreased tongue base retraction and reduced anterior laryngeal mobility. Pt able to reduce amount of residue with mod verbal cues provided by SLP to perform multiple dry swallows after each sip/bite. Recommend Dys 1 (puree) and pudding thick liquids, crushed meds, multiple swallows after each bite/sip) during intake to reduce risk of aspiration of residuals. Pt educated re: diet recommendation and allowed pt to view MBS on monitor after study. SLP will f/u to determine diet tolerance and safety. Impact on safety and function --    CHL IP TREATMENT RECOMMENDATION 10/15/2015 Treatment Recommendations Therapy as outlined in treatment plan below   Prognosis 10/15/2015 Prognosis for Safe Diet Advancement Good Barriers to Reach Goals Severity of deficits;Cognitive deficits Barriers/Prognosis Comment -- CHL IP DIET RECOMMENDATION 10/15/2015 SLP Diet Recommendations Dysphagia 1 (Puree) solids;Pudding thick liquid Liquid Administration via Cup;No straw Medication Administration Crushed with puree Compensations Minimize environmental distractions;Slow rate;Small sips/bites;Multiple dry swallows after each bite/sip;Clear throat intermittently Postural Changes Seated upright at 90 degrees   CHL IP OTHER RECOMMENDATIONS 10/15/2015 Recommended Consults -- Oral Care Recommendations Oral care BID Other Recommendations Prohibited food (jello, ice cream, thin soups);Order thickener from pharmacy;Remove water pitcher;Have oral suction available   CHL IP FOLLOW UP RECOMMENDATIONS 10/15/2015 Follow up Recommendations Inpatient Rehab   CHL IP FREQUENCY AND DURATION 10/15/2015 Speech Therapy Frequency (ACUTE ONLY) min 3x week Treatment Duration 2 weeks      CHL IP ORAL PHASE 10/15/2015 Oral Phase WFL Oral - Pudding Teaspoon -- Oral - Pudding Cup -- Oral - Honey Teaspoon -- Oral - Honey Cup -- Oral - Nectar Teaspoon -- Oral - Nectar Cup -- Oral - Nectar Straw -- Oral - Thin Teaspoon -- Oral - Thin Cup -- Oral - Thin Straw -- Oral - Puree -- Oral - Mech Soft -- Oral - Regular -- Oral - Multi-Consistency -- Oral - Pill -- Oral Phase -  Comment --  CHL IP PHARYNGEAL PHASE 10/15/2015 Pharyngeal Phase Impaired Pharyngeal- Pudding Teaspoon -- Pharyngeal -- Pharyngeal- Pudding Cup -- Pharyngeal -- Pharyngeal- Honey Teaspoon -- Pharyngeal -- Pharyngeal- Honey Cup Delayed swallow initiation-pyriform sinuses;Significant aspiration (Amount);Penetration/Aspiration during swallow;Reduced anterior laryngeal mobility;Pharyngeal residue - valleculae;Pharyngeal residue - pyriform;Reduced  tongue base retraction Pharyngeal Material enters airway, passes BELOW cords without attempt by patient to eject out (silent aspiration) Pharyngeal- Nectar Teaspoon -- Pharyngeal -- Pharyngeal- Nectar Cup Penetration/Aspiration during swallow;Pharyngeal residue - valleculae;Pharyngeal residue - pyriform;Reduced tongue base retraction;Reduced anterior laryngeal mobility;Delayed swallow initiation-pyriform sinuses;Significant aspiration (Amount) Pharyngeal Material enters airway, passes BELOW cords without attempt by patient to eject out (silent aspiration) Pharyngeal- Nectar Straw -- Pharyngeal -- Pharyngeal- Thin Teaspoon -- Pharyngeal -- Pharyngeal- Thin Cup -- Pharyngeal -- Pharyngeal- Thin Straw -- Pharyngeal -- Pharyngeal- Puree Pharyngeal residue - valleculae;Pharyngeal residue - pyriform;Reduced tongue base retraction;Reduced anterior laryngeal mobility;Delayed swallow initiation-vallecula Pharyngeal -- Pharyngeal- Mechanical Soft -- Pharyngeal -- Pharyngeal- Regular -- Pharyngeal -- Pharyngeal- Multi-consistency -- Pharyngeal -- Pharyngeal- Pill -- Pharyngeal -- Pharyngeal Comment --  CHL IP CERVICAL ESOPHAGEAL PHASE 10/15/2015 Cervical Esophageal Phase WFL Pudding Teaspoon -- Pudding Cup -- Honey Teaspoon -- Honey Cup -- Nectar Teaspoon -- Nectar Cup -- Nectar Straw -- Thin Teaspoon -- Thin Cup -- Thin Straw -- Puree -- Mechanical Soft -- Regular -- Multi-consistency -- Pill -- Cervical Esophageal Comment -- No flowsheet data found. Royce Macadamia 10/15/2015, 1:37 PM Breck Coons Lonell Face.Ed CCC-SLP Pager [phone]               Medications:  I have reviewed the patient's current medications. Scheduled Medications: . antiseptic oral rinse  7 mL Mouth Rinse q12n4p  . aspirin  81 mg Per Tube Daily  . atorvastatin  80 mg Oral q1800  . carvedilol  6.25 mg Per Tube BID WC  . chlorhexidine  15 mL Mouth Rinse BID  . enoxaparin (LOVENOX) injection  40 mg Subcutaneous Q24H  . insulin aspart  0-20 Units  Subcutaneous 6 times per day  . insulin glargine  10 Units Subcutaneous QHS  . lisinopril  2.5 mg Oral Daily  . pantoprazole sodium  40 mg Per Tube Daily  . senna  2 tablet Oral QHS  . spironolactone  12.5 mg Oral Daily  . ticagrelor  90 mg Oral BID   PRN Medications: acetaminophen, albuterol, alum & mag hydroxide-simeth, bisacodyl, diphenhydrAMINE, guaiFENesin-dextromethorphan, LORazepam, prochlorperazine **OR** prochlorperazine **OR** prochlorperazine, RESOURCE THICKENUP CLEAR, sodium phosphate, traZODone  Assessment/Plan: Principal Problem:   Ischemic stroke of frontal lobe (HCC) Active Problems:   Acute on chronic systolic congestive heart failure (HCC)   Dysphagia as late effect of cerebrovascular disease   Essential hypertension   Type 2 diabetes mellitus with complication (HCC)   Anoxic encephalopathy (HCC)  1. Cognitive, mobility, and functional deficits secondary to anoxic encephalopathy and right frontal infarct - continue CIR 2. DVT Prophylaxis/Anticoagulation: Pharmaceutical: Lovenox 3. Pain Management: tylenol prn 4. Mood: LCSW to follow for evaluation and support.  5. Neuropsych: This patient is not fully capable of making decisions on his own behalf. 6. Skin/Wound Care: Routine pressure relief measure.  7. Fluids/Electrolytes/Nutrition: Monitor I/O. Check lytes in am.  8. Anterior STEMI: to continue ASA, Brilinta, BB and statin. 9. Acute systolic heart failure: Has been compensated. Check daily weights and monitor for signs of overload. Continue aldactone, coreg and lisinopril.  10. 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 Neurology recommends coumadin plus plavix in one month.  11. MSSA PNA: s/p 12 d ancef course  -  pt remains an aspiration risk--observe aspiration precautions per SLP 3/24 12. Acute on chronic renal failure: Diuresis discontinued with improvement. Continue to monitor as on modified diet without liquids. May need IVF at nights to maintain  adequate hydration.  13. Anemia: Slowly improving. Continue to monitor for signs of bleeding.  14. DM type 2: New diagnosis with Hgb A1C-6.9. BS uncontrolled due to tube feeds. Continue lantus daily and will transition to oral medication as intake improves. Monitor BS with ac/hs checks and use SSI for elevated BS.   Length of stay, days: 1    Quinlan Mcfall A. Felicity Coyer, MD 10/16/2015, 10:48 AM

## 2015-10-16 NOTE — Progress Notes (Signed)
10/16/15 1755 nursing Patient is agitated , combative , refuses medication and treatments vital signs s/p fall. Cusses staff telling them to get out of his room and leave him alone. Consulting civil engineer notified.

## 2015-10-16 NOTE — Evaluation (Signed)
Physical Therapy Assessment and Plan  Patient Details  Name: Eddie [name] MRN: [id number] Date of Birth: 1951-05-17  PT Diagnosis: Abnormality of gait, Ataxia, Cognitive deficits, Coordination disorder, Difficulty walking and Impaired cognition Rehab Potential: Fair ELOS: 10-18 days    Today's Date: 10/16/2015 PT individual  Time:  1345-1410 25 minutes   Problem List:  Patient Active Problem List   Diagnosis Date Noted  . Ischemic stroke of frontal lobe (HCC) 10/15/2015  . Anoxic encephalopathy (HCC) 10/15/2015  . Right-sided cerebrovascular accident (CVA) (HCC)   . Acute on chronic systolic congestive heart failure (HCC)   . HCAP (healthcare-associated pneumonia)   . Dysphagia as late effect of cerebrovascular disease   . Tobacco abuse   . Tachypnea   . Essential hypertension   . Type 2 diabetes mellitus with complication (HCC)   . Leukocytosis   . Absolute anemia   . Altered mental status   . Hemiplegia (HCC)   . Stroke (HCC)   . ARDS (adult respiratory distress syndrome) (HCC)   . Acute respiratory failure (HCC)   . STEMI (ST elevation myocardial infarction) (HCC) 09/28/2015  . Cardiac arrest (HCC)   . ST elevation myocardial infarction (STEMI) (HCC)   . Encounter for central line placement   . Hypokalemia   . Encephalopathy acute   . Acute hypoxemic respiratory failure [facility])     Past Medical History:  Past Medical History  Diagnosis Date  . PUD (peptic ulcer disease)   . CAD (coronary artery disease)     a. STEMI 09/2015 w/ DES to Prox LAD  . CVA (cerebral infarction)     a. 09/2015: acute small right frontal lobe infarct   Past Surgical History:  Past Surgical History  Procedure Laterality Date  . Repair of peptic ulcer    . Cardiac catheterization N/A 09/28/2015    Procedure: Left Heart Cath and Coronary Angiography;  Surgeon: Runell Gess, MD;  Location: [facility] INVASIVE CV LAB;  Service: Cardiovascular;  Laterality: N/A;  . Cardiac catheterization N/A  10/08/2015    Procedure: Left Heart Cath and Coronary Angiography;  Surgeon: Lyn Records, MD;  Location: [facility] INVASIVE CV LAB;  Service: Cardiovascular;  Laterality: N/A;    Assessment & Plan Clinical Impression: Patient is a41 y.o. male who was admitted on 09/28/15 with chest pain with ST elevation due to anterior STEMI and developed unresponsiveness enroute to ED. He was intubated and treated with brief compressions and epi for PEA. He was taken to cath lab urgently and DES placed to LAD. 2D echo with EF 10-15%. He was treated with hypothermia protocol and during rewarming was noted to have agitation as well as left sided weakness on 03/13. 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 He developed copious oral secretions due to MSSA HCAP and was started on Vanc/Fortaz for treatment. Repeat echo with EF 30-35% with diffuse hypokinesis and no PFO or thrombus. EEG ordered due to fluctuating MS and showed generalized background slowing due to "pharmacologic, hypoxic, toxic-metabolic or other diffuse physiologic etiology". Treated with ARDS protocol and tolerated extubation on 03/21.   Dr. Roda Shutters felt that stroke possibly cardio-embolic. Patient to continue ASA/Brilinta for now and start warfarin/plavix in about a month.  Acute systolic failure treated with Ace, coreg and aldactone--question LiveVest depending on cognitive recovery per Dr. Shirlee Latch. MBS done and patient started on dysphagia one pudding liquids.  He sustained a fall last pm and follow up CT head with evolving infarct. Patient with resultant Left sided weakness, poor posture and cognitive deficits.   Patient transferred to CIR on 10/15/2015 .   Patient currently requires mod with mobility secondary to muscle weakness, ataxia and decreased coordination, decreased attention to left, decreased attention,  decreased awareness, decreased problem solving, decreased safety awareness, decreased memory and delayed processing and decreased balance strategies.  Prior to hospitalization, patient was independent  with mobility and lived with Spouse in a House home.  Home access is 1Stairs to enter.  Patient will benefit from skilled PT intervention to maximize safe functional mobility, minimize fall risk and decrease caregiver burden for planned discharge home with 24 hour supervision.  Anticipate patient will benefit from follow up [place] at discharge.  PT - End of Session Activity Tolerance: Tolerates 30+ min activity with multiple rests Endurance Deficit: Yes PT Assessment Rehab Potential (ACUTE/IP ONLY): Fair Barriers to Discharge: Decreased caregiver support;Other (comment) (impaired cognition) PT Patient demonstrates impairments in the following area(s): Balance;Behavior;Endurance;Motor;Perception;Safety;Sensory PT Transfers Functional Problem(s): Bed Mobility;Bed to Chair;Car;Furniture;Floor PT Locomotion Functional Problem(s): Ambulation;Wheelchair Mobility;Stairs PT Plan PT Intensity: Minimum of 1-2 x/day ,45 to 90 minutes PT Frequency: 5 out of 7 days PT Duration Estimated Length of Stay: 10-18 days  PT Treatment/Interventions: Ambulation/gait training;Balance/vestibular training;Cognitive remediation/compensation;Community reintegration;Discharge planning;Disease management/prevention;DME/adaptive equipment instruction;Functional mobility training;Neuromuscular re-education;Pain management;Patient/family education;Psychosocial support;Skin care/wound management;Stair training;Therapeutic Activities;Therapeutic Exercise;UE/LE Strength taining/ROM;UE/LE Coordination activities;Visual/perceptual remediation/compensation;Wheelchair propulsion/positioning PT Transfers Anticipated Outcome(s): Supervision A  PT Locomotion Anticipated Outcome(s): Supervision with RW and WC.  PT  Recommendation Recommendations for Other Services: Neuropsych consult Follow Up Recommendations: Home health PT Patient destination: Home Equipment Recommended: Rolling walker with 5" wheels;Wheelchair (measurements);Wheelchair cushion (measurements)  Skilled Therapeutic Intervention  Patient received sitting EOB and very agitated. Pt demanding Water and ice from wife, which is unavailable due to Dysphagia 1 (pudding thick) diet. PT explained to patient and pt's wife the importance of maintaining proper thickness to prevent secondary problems. Pt assessed patient's bed mobility and provided min A for sit to supine and supine to Sit. PT attempted to engage patient to participate in continued PT, but patient stated that he would not do anything else until he had water and Ice. Patient was transported to day room by NT to place cage bed in room. PT  helped patient perform Sit<>stand with patient at [facility] and WC>bed transfer with max A +2 with NT due to decreased participation from patient. Patient performed roll in bed to L and R with Supervision from PT. Patient continued to refuse to participate, even after PT explained the importance of physical activity and continued participation in therapy. Patient left in net bed with call bell in reach.    PT Evaluation Precautions/Restrictions Precautions Precautions: Fall Restrictions Weight Bearing Restrictions: No General Chart Reviewed: Yes PT Amount of Missed Time (min): 50 Minutes PT Missed Treatment Reason: Increased agitation Family/Caregiver Present: Yes Vital SignsTherapy Vitals Temp: 97.6 F (36.4 C) Temp Source: Oral Pulse Rate: 78 Resp: 18 BP: 109/66 mmHg Patient Position (if appropriate): Sitting Oxygen Therapy SpO2: 100 % O2 Device: Not Delivered Pain Pain Assessment Pain Assessment: No/denies pain PAINAD (Pain Assessment in Advanced Dementia) Breathing: normal Home Living/Prior Functioning Home Living Available Help at  Discharge: Family Type of Home: House Home Access: Stairs to enter Technical brewer of Steps: 1 Home Layout: One level Bathroom Shower/Tub: Chiropodist: Standard Bathroom Accessibility: Yes (per verbal report from patient) Additional Comments: works at [employer] with  wife   Lives With: Spouse Prior Function Level of Independence: Independent with basic ADLs;Independent with homemaking with ambulation;Independent with gait;Independent with transfers Vocation: Full time employment Vision/Perception     Cognition Overall Cognitive Status: Impaired/Different from baseline Arousal/Alertness: Awake/alert Orientation Level: Oriented to person;Oriented to place Attention: Sustained Sustained Attention: Impaired Sustained Attention Impairment: Verbal basic;Functional basic Selective Attention: Impaired Selective Attention Impairment: Verbal basic;Functional basic Memory: Impaired Memory Impairment: Decreased recall of new information;Decreased short term memory;Storage deficit Decreased Short Term Memory: Verbal basic;Functional basic Awareness: Impaired Awareness Impairment: Emergent impairment;Anticipatory impairment Problem Solving: Impaired Problem Solving Impairment: Functional basic Executive Function: Reasoning;Organizing;Decision Making;Self Monitoring;Self Correcting Reasoning: Impaired Reasoning Impairment: Verbal basic;Functional basic Organizing: Impaired Organizing Impairment: Verbal basic;Functional basic Decision Making: Impaired Decision Making Impairment: Functional basic Self Monitoring: Impaired Self Monitoring Impairment: Verbal basic;Functional basic Self Correcting: Impaired Self Correcting Impairment: Verbal basic;Functional basic Behaviors: Verbal agitation;Impulsive Safety/Judgment: Impaired Comments: Pt demonstrated high levels of verbal agitation/frustration at time of Eval Sensation Sensation Light Touch: Not tested (Unable to test  due to increased agitation) Motor  Motor Motor: Ataxia (difficult to fully assess due to increased agitation)  Mobility Bed Mobility Bed Mobility: Supine to Sit;Sit to Supine;Rolling Right;Rolling Left Rolling Right: 6: Modified independent (Device/Increase time) Rolling Left: 6: Modified independent (Device/Increase time) Supine to Sit: 4: Min assist Sit to Supine: 4: Min assist Transfers Transfers: Yes Sit to Stand: 2: Max assist;Other/comment (+2 from NT) Sit to Stand Details: Other (comment) (Max A provided due to decreased willingness to partipate and increased agitation) Stand to Sit: 2: Max assist;Other (comment) (+2 from NT. ) Locomotion  Ambulation Ambulation: No (Patient refused )  Trunk/Postural Assessment  Cervical Assessment Cervical Assessment: Within Functional Limits Thoracic Assessment Thoracic Assessment: Within Functional Limits Lumbar Assessment Lumbar Assessment: Within Functional Limits Postural Control Postural Control: Deficits on evaluation Righting Reactions: deficits noted in reighting reactions in sitting   Balance Balance Balance Assessed: Yes Static Sitting Balance Static Sitting - Balance Support: Feet supported Static Sitting - Level of Assistance: 7: Independent Dynamic Sitting Balance Dynamic Sitting - Balance Support: Feet supported Dynamic Sitting - Level of Assistance: 5: Stand by assistance Sitting balance - Comments: Supervision required for increased safety with patient sitting EOB while agitated  Extremity Assessment      RLE Assessment RLE Assessment: Exceptions to [facility] RLE Strength RLE Overall Strength Comments: Unable to fully assess, at least 3/5 with functional movements sitting EOB and supine in bed.  LLE Assessment LLE Assessment: Exceptions to [facility] LLE Strength LLE Overall Strength Comments: Unable to fully assess due to increased agitation, at least 3/5 through functional movements sitting EOB and supine in bed.     See Function Navigator for Current Functional Status.   Refer to Care Plan for Long Term Goals  Recommendations for other services: Neuropsych  Discharge Criteria: Patient will be discharged from PT if patient refuses treatment 3 consecutive times without medical reason, if treatment goals not met, if there is a change in medical status, if patient makes no progress towards goals or if patient is discharged from hospital.  The above assessment, treatment plan, treatment alternatives and goals were discussed and mutually agreed upon: by family  Lorie Phenix 10/16/2015, 2:48 PM

## 2015-10-16 NOTE — Progress Notes (Signed)
10/16/15 1330  What Happened  Was fall witnessed? No  Was patient injured? Unsure  Patient found on floor  Found by Staff-comment (wife in room; another family member called gor help)  Stated prior activity other (comment) (wife took out quick release belt when pt asked her; )  Follow Up  MD notified Dr. Felicity Coyer  Time MD notified [phone]  Family notified Yes-comment (wife in room with patient)  Time family notified 1330  Additional tests Yes-comment  Simple treatment Dressing  Progress note created (see row info) (pt. had a cut on his rt eyebrow)  Adult Fall Risk Assessment  Risk Factor Category (scoring not indicated) High fall risk per protocol (document High fall risk)  Age 65  Fall History: Fall within 6 months prior to admission 5  Elimination; Bowel and/or Urine Incontinence 2  Elimination; Bowel and/or Urine Urgency/Frequency 2  Medications: includes PCA/Opiates, Anti-convulsants, Anti-hypertensives, Diuretics, Hypnotics, Laxatives, Sedatives, and Psychotropics 5  Patient Care Equipment 2  Mobility-Assistance 2  Mobility-Gait 2  Mobility-Sensory Deficit 2  Cognition-Awareness 1  Cognition-Impulsiveness 2  Cognition-Limitations 4  Total Score 30  Adult Fall Risk Interventions  Required Bundle Interventions *See Row Information* High fall risk - low, moderate, and high requirements implemented  Additional Interventions Room near nurses station;Specialty bed:  Low bed;Fall risk signage;Use of appropriate toileting equipment (bedpan, BSC, etc.);Other (Comment) (mats; veil bed)  Fall with Injury Screening  Risk For Fall Injury- See Row Information  F  Intervention(s) for 2 or more risk criteria identified Floor Mat;Gait Belt;Low Bed  Vitals  Temp 97 F (36.1 C)  Temp Source Oral  BP 115/72 mmHg  BP Location Left Arm  BP Method Automatic  Patient Position (if appropriate) Sitting  Pulse Rate (!) 108  Pulse Rate Source Dinamap  Resp 18  Oxygen Therapy  SpO2 94 %  O2  Device Room Air  Pain Assessment  Pain Assessment No/denies pain  PAINAD (Pain Assessment in Advanced Dementia)  Breathing 0  Neurological  Neuro (WDL) X (uncooperative to staff; )  Level of Consciousness Alert  Orientation Level Oriented to person;Disoriented to situation;Disoriented to time;Disoriented to place  Cognition Poor attention/concentration;Poor judgement;Poor safety awareness;Impulsive  Speech Clear  R Pupil Size (mm) 3  R Pupil Shape Round  R Pupil Reaction Brisk  L Pupil Size (mm) 3  L Pupil Shape Round  L Pupil Reaction Brisk  Additional Pupil Assessments No  R Hand Grip Present;Moderate  L Hand Grip Present;Moderate   RUE Motor Response Purposeful movement  RUE Motor Strength 5  LUE Motor Response Purposeful movement  LUE Motor Strength 4  RLE Motor Response Purposeful movement  RLE Motor Strength 5  LLE Motor Response Purposeful movement  LLE Motor Strength 5  Neuro Symptoms None  Musculoskeletal  Musculoskeletal (WDL) X  Assistive Device Wheelchair  Generalized Weakness Yes  Integumentary  Integumentary (WDL) X  Skin Color Appropriate for ethnicity  Skin Integrity Other (Comment) (gush  rt eyebrow 93/4 of an inch)  Skin Turgor Non-tenting

## 2015-10-16 NOTE — Progress Notes (Signed)
Nutrition Follow-up  DOCUMENTATION CODES:   Not applicable  INTERVENTION:   -Magic Cup TID with meals -RD will continue to follow for diet advancement  NUTRITION DIAGNOSIS:   Inadequate oral intake related to dysphagia as evidenced by meal completion < 50%.  GOAL:   Patient will meet greater than or equal to 90% of their needs  MONITOR:   PO intake, Supplement acceptance, Diet advancement, Labs, Weight trends, Skin, I & O's  REASON FOR ASSESSMENT:   Consult Diet education  ASSESSMENT:   Eddie [name] is a 65 y.o. male who was admitted on 09/28/15 with chest pain with ST elevation due to anterior STEMI and developed unresponsiveness enroute to ED. He was intubated and treated with brief compressions and epi for PEA.  Pt admitted to CIR for diagnosis of rt frontal lobe infarct.   Pt is familiar to this RD, due to hospitalization prior to CIR admission. Pt underwent  MBSS on 10/15/15 and was advanced to a dysphagia 1 diet with pudding thick liquids. Diet order has continued with CIR admission. Pt was impulsive and very eager to eat yesterday. Meal completion 0-50%. Due to restrictions of current diet order, pt is at risk for malnutrition, poor po intake, and dehydration.   Pt sleeping soundly at time of visit with bed in low position.   Nutrition-Focused physical exam completed. Findings are no fat depletion, no muscle depletion, and no edema.   Pt not appropriate for diet education at this time, due to cognitive deficits. No family members in room presently. RD will continue to follow for nutritional needs and will provide ongoing education throughout CIR stay and at discharge as needed, particularly if pt's diet is able to be advanced further.   Labs reviewed: K: 3.4, CBGS: 176-386.   Diet Order:  DIET - DYS 1 Room service appropriate?: Yes; Fluid consistency:: Pudding Thick  Skin:  Reviewed, no issues  Last BM:  10/15/15  Height:   Ht Readings from Last 1 Encounters:   10/15/15 5\' 7"  (1.702 m)    Weight:   Wt Readings from Last 1 Encounters:  10/16/15 128 lb 2 oz (58.117 kg)    Ideal Body Weight:  67.3 kg  BMI:  Body mass index is 20.06 kg/(m^2).  Estimated Nutritional Needs:   Kcal:  1600-1800  Protein:  75-90 grams  Fluid:  1.6-1.8 L  EDUCATION NEEDS:   No education needs identified at this time  Myrla Malanowski A. Mayford Knife, RD, LDN, CDE Pager: [phone] After hours Pager: [phone]

## 2015-10-17 ENCOUNTER — Inpatient Hospital Stay (HOSPITAL_COMMUNITY): Payer: BLUE CROSS/BLUE SHIELD | Admitting: Physical Therapy

## 2015-10-17 DIAGNOSIS — R071 Chest pain on breathing: Secondary | ICD-10-CM

## 2015-10-17 LAB — RENAL FUNCTION PANEL
ANION GAP: 11 (ref 5–15)
Albumin: 2.7 g/dL — ABNORMAL LOW (ref 3.5–5.0)
BUN: 40 mg/dL — ABNORMAL HIGH (ref 6–20)
CALCIUM: 8.5 mg/dL — AB (ref 8.9–10.3)
CHLORIDE: 105 mmol/L (ref 101–111)
CO2: 25 mmol/L (ref 22–32)
Creatinine, Ser: 1.33 mg/dL — ABNORMAL HIGH (ref 0.61–1.24)
GFR calc non Af Amer: 55 mL/min — ABNORMAL LOW (ref >60)
GLUCOSE: 284 mg/dL — AB (ref 65–99)
POTASSIUM: 3 mmol/L — AB (ref 3.5–5.1)
Phosphorus: 2.6 mg/dL (ref 2.5–4.6)
SODIUM: 141 mmol/L (ref 135–145)

## 2015-10-17 LAB — GLUCOSE, CAPILLARY
GLUCOSE-CAPILLARY: 256 mg/dL — AB (ref 65–99)
Glucose-Capillary: 120 mg/dL — ABNORMAL HIGH (ref 65–99)
Glucose-Capillary: 318 mg/dL — ABNORMAL HIGH (ref 65–99)

## 2015-10-17 MED ORDER — MUSCLE RUB 10-15 % EX CREA
TOPICAL_CREAM | CUTANEOUS | Status: DC | PRN
  Administered 2015-10-21 (×2): via TOPICAL
  Filled 2015-10-17: qty 85

## 2015-10-17 NOTE — Plan of Care (Signed)
Problem: RH SAFETY Goal: RH STG ADHERE TO SAFETY PRECAUTIONS W/ASSISTANCE/DEVICE STG Adhere to Safety Precautions With Assistance/Device.  Outcome: Not Progressing Patient on veil bed due to fall; agitation ; non compliance

## 2015-10-17 NOTE — Progress Notes (Signed)
Physical Therapy Session Note  Patient Details  Name: Eddie [name] MRN: [id number] Date of Birth: [birth year]  Today's Date: 10/17/2015 PT Individual Time: 0930-1010 PT Individual Time Calculation (min): 40 min   Short Term Goals: Week 1:  PT Short Term Goal 1 (Week 1): Patient will be able to ambulate at least 50 ft with Mod A with LRAD PT Short Term Goal 2 (Week 1): Patient will demonstrate bed mobility with Supervision A  PT Short Term Goal 3 (Week 1): Patient will propell WC with min A for 11ft.   Skilled Therapeutic Interventions/Progress Updates:   Pt easily redirectable initially in session though safety awareness significantly impaired. At end of session, pt with increasing agitation due to needing to go back in net bed since no staff available for supervision at conclusion of session. Pt with great difficulty to be redirected, left in care of CNA at end of session. Pt would continue to benefit from skilled PT services to increase functional mobility.  Therapy Documentation Precautions:  Precautions Precautions: Fall Precaution Comments: impulsive, net bed Restrictions Weight Bearing Restrictions: No Vital Signs: Therapy Vitals Temp: 97.6 F (36.4 C) Temp Source: Oral Pulse Rate: 94 Resp: 20 BP: (!) 156/92 mmHg Patient Position (if appropriate): Sitting Oxygen Therapy SpO2: 99 % O2 Device: Not Delivered Pain: Pain Assessment Pain Assessment: No/denies pain Mobility:  Mod A with cues for safety and attention Locomotion :   Mod A 15' with cues for safety and attention Other Treatments:  Pt behavior managed through redirection, education, environmental management, active listening, and low stim throughout session. Pt performs transfers x10 in session. Static standing in session 2'x1. Pt performs dynamic sitting and standing balance activities with dual motor tasks including reaching, grasping, weight shifting, and LE manipulation within functional context. Pt educated on  rehab plan, safety in mobility, and needs for discharge.   See Function Navigator for Current Functional Status.   Therapy/Group: Individual Therapy  Christia Reading 10/17/2015, 12:27 PM

## 2015-10-17 NOTE — Progress Notes (Signed)
Eddie [name] is a 65 y.o. male [birth year] [id number]  Subjective: Events from last PM reviewed - continued anterior chest soreness from fall - no other complaints - denies head pain or other problems. No shortness of breath   Objective: Vital signs in last 24 hours: Temp:  [97 F (36.1 C)-98.3 F (36.8 C)] 97.8 F (36.6 C) (03/26 0900) Pulse Rate:  [70-108] 90 (03/26 0900) Resp:  [18-20] 20 (03/26 0900) BP: (86-144)/(39-114) 102/79 mmHg (03/26 0900) SpO2:  [94 %-100 %] 100 % (03/26 0900) Weight change:  Last BM Date: 10/15/15  Intake/Output from previous day: 03/25 0701 - 03/26 0700 In: 360 [P.O.:360] Out: 200 [Urine:200]  Physical Exam General: No apparent distress   Calm and appropraitely interactive (within vail bed) Lungs: Normal effort. Lungs clear to auscultation, no crackles or wheezes. Cardiovascular: Regular rate and rhythm, no edema Neurological: No new neurological deficits   Lab Results: BMET    Component Value Date/Time   NA 145 10/16/2015 0827   K 3.4* 10/16/2015 0827   CL 107 10/16/2015 0827   CO2 24 10/16/2015 0827   GLUCOSE 162* 10/16/2015 0827   BUN 43* 10/16/2015 0827   CREATININE 1.21 10/16/2015 0827   CALCIUM 8.9 10/16/2015 0827   GFRNONAA >60 10/16/2015 0827   GFRAA >60 10/16/2015 0827   CBC    Component Value Date/Time   WBC 14.1* 10/16/2015 0827   RBC 4.01* 10/16/2015 0827   HGB 9.9* 10/16/2015 0827   HCT 32.8* 10/16/2015 0827   PLT 722* 10/16/2015 0827   MCV 81.8 10/16/2015 0827   MCH 24.7* 10/16/2015 0827   MCHC 30.2 10/16/2015 0827   RDW 15.1 10/16/2015 0827   LYMPHSABS 1.1 10/16/2015 0827   MONOABS 0.4 10/16/2015 0827   EOSABS 0.0 10/16/2015 0827   BASOSABS 0.0 10/16/2015 0827   CBG's (last 3):    Recent Labs  10/16/15 0057 10/16/15 2246 10/17/15 0700  GLUCAP 199* 196* 120*   LFT's Lab Results  Component Value Date   ALT 9* 10/16/2015   AST 27 10/16/2015   ALKPHOS 100 10/16/2015   BILITOT 0.7 10/16/2015     Studies/Results: Ct Head Wo Contrast  10/16/2015  CLINICAL DATA:  Fall from wheelchair today. Pt hit head on right side. EXAM: CT HEAD WITHOUT CONTRAST TECHNIQUE: Contiguous axial images were obtained from the base of the skull through the vertex without intravenous contrast. COMPARISON:  10/14/2015 FINDINGS: The ventricles are normal in size and configuration. There are no parenchymal masses or mass effect. There is no evidence of a recent cortical infarct. There is a small focal hypoattenuation in the right base a ganglia consistent with an old lacune infarct. There are 2 discrete small foci of hypoattenuation in the right and left frontal lobe subcortical white matter consistent with old lacune infarcts. There are no extra-axial masses or abnormal fluid collections. There is no intracranial hemorrhage. Visualized sinuses and mastoid air cells are clear. No skull fracture. IMPRESSION: 1. No acute intracranial abnormalities. No change from the prior CT scan of the brain. No skull fracture. Electronically Signed   By: Amie Portland M.D.   On: 10/16/2015 21:49   Dg Swallowing Func-speech Pathology  10/15/2015  Objective Swallowing Evaluation: Type of Study: MBS-Modified Barium Swallow Study Patient Details Name: Eddie [name] MRN: [id number] Date of Birth: 11/06/1950 Today's Date: 10/15/2015 Time: SLP Start Time (ACUTE ONLY): 1033-SLP Stop Time (ACUTE ONLY): 1049 SLP Time Calculation (min) (ACUTE ONLY): 16 min Past Medical History: Past Medical History Diagnosis Date . PUD (  peptic ulcer disease)  Past Surgical History: Past Surgical History Procedure Laterality Date . Repair of peptic ulcer   . Cardiac catheterization N/A 09/28/2015   Procedure: Left Heart Cath and Coronary Angiography;  Surgeon: Runell Gess, MD;  Location: [facility] INVASIVE CV LAB;  Service: Cardiovascular;  Laterality: N/A; . Cardiac catheterization N/A 10/08/2015   Procedure: Left Heart Cath and Coronary Angiography;  Surgeon: Lyn Records, MD;   Location: [facility] INVASIVE CV LAB;  Service: Cardiovascular;  Laterality: N/A; HPI: 65 year old male admitted 3/7 chest pain, becoming unresponsive shortely before arrival to ED. Patient with cardiac arrest (brief chest compressions),  NSTEMI, intubated. Possible CVA symptoms noted on 3/13. 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 Extubated 3/20.  No Data Recorded Assessment / Plan / Recommendation CHL IP CLINICAL IMPRESSIONS 10/15/2015 Therapy Diagnosis -- Clinical Impression MBS complete. Pt exhibited mod-severe pharyngeal phase dysphagia characterized by sensory > motor deficits. Delayed swallow intiation to the pyriform sinuses, due to impaired sensation, culminated in gross silent aspiration of honey and nectar thick liquids during the swallow. Min-mod pharyngeal residue in vallecula and pyriform sinuses observed across all consistencies as a result of decreased tongue base retraction and reduced anterior laryngeal mobility. Pt able to reduce amount of residue with mod verbal cues provided by SLP to perform multiple dry swallows after each sip/bite. Recommend Dys 1 (puree) and pudding thick liquids, crushed meds, multiple swallows after each bite/sip) during intake to reduce risk of aspiration of residuals. Pt educated re: diet recommendation and allowed pt to view MBS on monitor after study. SLP will f/u to determine diet tolerance and safety. Impact on safety and function --   CHL IP TREATMENT RECOMMENDATION 10/15/2015 Treatment Recommendations Therapy as outlined in treatment plan below   Prognosis 10/15/2015 Prognosis for Safe Diet Advancement Good Barriers to Reach Goals Severity of deficits;Cognitive deficits Barriers/Prognosis Comment -- CHL IP DIET RECOMMENDATION 10/15/2015 SLP Diet Recommendations Dysphagia 1 (Puree) solids;Pudding thick liquid Liquid Administration via Cup;No straw Medication Administration Crushed with puree Compensations Minimize  environmental distractions;Slow rate;Small sips/bites;Multiple dry swallows after each bite/sip;Clear throat intermittently Postural Changes Seated upright at 90 degrees   CHL IP OTHER RECOMMENDATIONS 10/15/2015 Recommended Consults -- Oral Care Recommendations Oral care BID Other Recommendations Prohibited food (jello, ice cream, thin soups);Order thickener from pharmacy;Remove water pitcher;Have oral suction available   CHL IP FOLLOW UP RECOMMENDATIONS 10/15/2015 Follow up Recommendations Inpatient Rehab   CHL IP FREQUENCY AND DURATION 10/15/2015 Speech Therapy Frequency (ACUTE ONLY) min 3x week Treatment Duration 2 weeks      CHL IP ORAL PHASE 10/15/2015 Oral Phase WFL Oral - Pudding Teaspoon -- Oral - Pudding Cup -- Oral - Honey Teaspoon -- Oral - Honey Cup -- Oral - Nectar Teaspoon -- Oral - Nectar Cup -- Oral - Nectar Straw -- Oral - Thin Teaspoon -- Oral - Thin Cup -- Oral - Thin Straw -- Oral - Puree -- Oral - Mech Soft -- Oral - Regular -- Oral - Multi-Consistency -- Oral - Pill -- Oral Phase - Comment --  CHL IP PHARYNGEAL PHASE 10/15/2015 Pharyngeal Phase Impaired Pharyngeal- Pudding Teaspoon -- Pharyngeal -- Pharyngeal- Pudding Cup -- Pharyngeal -- Pharyngeal- Honey Teaspoon -- Pharyngeal -- Pharyngeal- Honey Cup Delayed swallow initiation-pyriform sinuses;Significant aspiration (Amount);Penetration/Aspiration during swallow;Reduced anterior laryngeal mobility;Pharyngeal residue - valleculae;Pharyngeal residue - pyriform;Reduced tongue base retraction Pharyngeal Material enters airway, passes BELOW cords without attempt by patient to eject out (silent aspiration) Pharyngeal- Nectar Teaspoon -- Pharyngeal --  Pharyngeal- Nectar Cup Penetration/Aspiration during swallow;Pharyngeal residue - valleculae;Pharyngeal residue - pyriform;Reduced tongue base retraction;Reduced anterior laryngeal mobility;Delayed swallow initiation-pyriform sinuses;Significant aspiration (Amount) Pharyngeal Material enters airway, passes  BELOW cords without attempt by patient to eject out (silent aspiration) Pharyngeal- Nectar Straw -- Pharyngeal -- Pharyngeal- Thin Teaspoon -- Pharyngeal -- Pharyngeal- Thin Cup -- Pharyngeal -- Pharyngeal- Thin Straw -- Pharyngeal -- Pharyngeal- Puree Pharyngeal residue - valleculae;Pharyngeal residue - pyriform;Reduced tongue base retraction;Reduced anterior laryngeal mobility;Delayed swallow initiation-vallecula Pharyngeal -- Pharyngeal- Mechanical Soft -- Pharyngeal -- Pharyngeal- Regular -- Pharyngeal -- Pharyngeal- Multi-consistency -- Pharyngeal -- Pharyngeal- Pill -- Pharyngeal -- Pharyngeal Comment --  CHL IP CERVICAL ESOPHAGEAL PHASE 10/15/2015 Cervical Esophageal Phase WFL Pudding Teaspoon -- Pudding Cup -- Honey Teaspoon -- Honey Cup -- Nectar Teaspoon -- Nectar Cup -- Nectar Straw -- Thin Teaspoon -- Thin Cup -- Thin Straw -- Puree -- Mechanical Soft -- Regular -- Multi-consistency -- Pill -- Cervical Esophageal Comment -- No flowsheet data found. Royce Macadamia 10/15/2015, 1:37 PM Breck Coons Lonell Face.Ed CCC-SLP Pager [phone]               Medications:  I have reviewed the patient's current medications. Scheduled Medications: . antiseptic oral rinse  7 mL Mouth Rinse q12n4p  . aspirin  81 mg Per Tube Daily  . atorvastatin  80 mg Oral q1800  . carvedilol  6.25 mg Per Tube BID WC  . chlorhexidine  15 mL Mouth Rinse BID  . enoxaparin (LOVENOX) injection  40 mg Subcutaneous Q24H  . insulin aspart  0-20 Units Subcutaneous TID WC  . insulin glargine  10 Units Subcutaneous QHS  . lisinopril  2.5 mg Oral Daily  . pantoprazole sodium  40 mg Per Tube Daily  . senna  2 tablet Oral QHS  . spironolactone  12.5 mg Oral Daily  . ticagrelor  90 mg Oral BID   PRN Medications: acetaminophen, albuterol, alum & mag hydroxide-simeth, bisacodyl, diphenhydrAMINE, guaiFENesin-dextromethorphan, LORazepam, nitroGLYCERIN, prochlorperazine **OR** prochlorperazine **OR** prochlorperazine, RESOURCE  THICKENUP CLEAR, sodium phosphate, traZODone  Assessment/Plan: Principal Problem:   Ischemic stroke of frontal lobe (HCC) Active Problems:   Acute on chronic systolic congestive heart failure (HCC)   Dysphagia as late effect of cerebrovascular disease   Essential hypertension   Type 2 diabetes mellitus with complication (HCC)   Anoxic encephalopathy (HCC)  1. Cognitive, mobility, and functional deficits secondary to anoxic encephalopathy and right frontal infarct - continue CIR 2. DVT Prophylaxis/Anticoagulation: Pharmaceutical: Lovenox 3. Pain Management: tylenol prn 4. Mood: LCSW to follow for evaluation and support.  5. Neuropsych: This patient is not fully capable of making decisions on his own behalf. 6. Skin/Wound Care: Routine pressure relief measure.  7. Fluids/Electrolytes/Nutrition: Monitor I/O. Check lytes in am.  8. Anterior STEMI: to continue ASA, Brilinta, BB and statin. 9. Acute systolic heart failure: Has been compensated. Check daily weights and monitor for signs of overload. Continue aldactone, coreg and lisinopril.  10. 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 Neurology recommends coumadin plus plavix in one month.  11. MSSA PNA: s/p 12 d ancef course  -pt remains an aspiration risk--observe aspiration precautions per SLP 3/24 12. Acute on chronic renal failure: Diuresis discontinued with improvement. Continue to monitor as on modified diet without liquids. May need IVF at nights to maintain adequate hydration.  13. Anemia: Slowly improving. Continue to monitor for signs of bleeding.  14. DM type 2: New diagnosis with Hgb A1C-6.9. BS uncontrolled due to tube feeds. Continue lantus daily and will transition to oral medication  as intake improves. Monitor BS with ac/hs checks and use SSI for elevated BS.  15. Fall 3/25 PM - head CT unchanged - dressing to head laceration on R temple and "Tiger balm" to anterior chest for MSkel pain (ECG without ischemia last  PM)  Length of stay, days: 2    Chevy Sweigert A. Felicity Coyer, MD 10/17/2015, 10:34 AM

## 2015-10-18 ENCOUNTER — Inpatient Hospital Stay (HOSPITAL_COMMUNITY): Payer: BLUE CROSS/BLUE SHIELD | Admitting: Occupational Therapy

## 2015-10-18 ENCOUNTER — Inpatient Hospital Stay (HOSPITAL_COMMUNITY): Payer: BLUE CROSS/BLUE SHIELD | Admitting: Physical Therapy

## 2015-10-18 ENCOUNTER — Inpatient Hospital Stay (HOSPITAL_COMMUNITY): Payer: BLUE CROSS/BLUE SHIELD | Admitting: Speech Pathology

## 2015-10-18 LAB — BASIC METABOLIC PANEL
Anion gap: 11 (ref 5–15)
Anion gap: 11 (ref 5–15)
BUN: 26 mg/dL — AB (ref 6–20)
BUN: 25 mg/dL — AB (ref 6–20)
CALCIUM: 8.1 mg/dL — AB (ref 8.9–10.3)
CALCIUM: 8.1 mg/dL — AB (ref 8.9–10.3)
CO2: 25 mmol/L (ref 22–32)
CO2: 25 mmol/L (ref 22–32)
CREATININE: 0.92 mg/dL (ref 0.61–1.24)
Chloride: 103 mmol/L (ref 101–111)
Chloride: 100 mmol/L — ABNORMAL LOW (ref 101–111)
Creatinine, Ser: 1.04 mg/dL (ref 0.61–1.24)
GFR calc Af Amer: >60 mL/min (ref >60)
GFR calc Af Amer: >60 mL/min (ref >60)
GLUCOSE: 185 mg/dL — AB (ref 65–99)
GLUCOSE: 316 mg/dL — AB (ref 65–99)
Potassium: 2.6 mmol/L — CL (ref 3.5–5.1)
Potassium: 2.8 mmol/L — ABNORMAL LOW (ref 3.5–5.1)
Sodium: 139 mmol/L (ref 135–145)
Sodium: 136 mmol/L (ref 135–145)

## 2015-10-18 LAB — RENAL FUNCTION PANEL
Albumin: 2.6 g/dL — ABNORMAL LOW (ref 3.5–5.0)
Anion gap: 10 (ref 5–15)
BUN: 26 mg/dL — AB (ref 6–20)
CHLORIDE: 102 mmol/L (ref 101–111)
CO2: 28 mmol/L (ref 22–32)
CREATININE: 0.98 mg/dL (ref 0.61–1.24)
Calcium: 8.2 mg/dL — ABNORMAL LOW (ref 8.9–10.3)
GFR calc non Af Amer: >60 mL/min (ref >60)
Glucose, Bld: 153 mg/dL — ABNORMAL HIGH (ref 65–99)
Phosphorus: 2.6 mg/dL (ref 2.5–4.6)
Potassium: 2.7 mmol/L — CL (ref 3.5–5.1)
Sodium: 140 mmol/L (ref 135–145)

## 2015-10-18 LAB — COMPREHENSIVE METABOLIC PANEL
ALT: 8 U/L — AB (ref 17–63)
AST: 17 U/L (ref 15–41)
Albumin: 2.5 g/dL — ABNORMAL LOW (ref 3.5–5.0)
Alkaline Phosphatase: 76 U/L (ref 38–126)
Anion gap: 9 (ref 5–15)
BILIRUBIN TOTAL: 0.4 mg/dL (ref 0.3–1.2)
BUN: 24 mg/dL — AB (ref 6–20)
CHLORIDE: 105 mmol/L (ref 101–111)
CO2: 23 mmol/L (ref 22–32)
CREATININE: 1.06 mg/dL (ref 0.61–1.24)
Calcium: 8.1 mg/dL — ABNORMAL LOW (ref 8.9–10.3)
GFR calc Af Amer: >60 mL/min (ref >60)
GLUCOSE: 244 mg/dL — AB (ref 65–99)
Potassium: 3.4 mmol/L — ABNORMAL LOW (ref 3.5–5.1)
Sodium: 137 mmol/L (ref 135–145)
Total Protein: 6.5 g/dL (ref 6.5–8.1)

## 2015-10-18 LAB — GLUCOSE, CAPILLARY
GLUCOSE-CAPILLARY: 77 mg/dL (ref 65–99)
GLUCOSE-CAPILLARY: 269 mg/dL — AB (ref 65–99)
GLUCOSE-CAPILLARY: 86 mg/dL (ref 65–99)
Glucose-Capillary: 154 mg/dL — ABNORMAL HIGH (ref 65–99)
Glucose-Capillary: 194 mg/dL — ABNORMAL HIGH (ref 65–99)
Glucose-Capillary: 274 mg/dL — ABNORMAL HIGH (ref 65–99)

## 2015-10-18 MED ORDER — POTASSIUM CHLORIDE 10 MEQ/100ML IV SOLN
10.0000 meq | INTRAVENOUS | Status: AC
  Administered 2015-10-18 (×3): 10 meq via INTRAVENOUS
  Filled 2015-10-18 (×3): qty 100

## 2015-10-18 MED ORDER — POTASSIUM CHLORIDE 20 MEQ/15ML (10%) PO SOLN
10.0000 meq | Freq: Two times a day (BID) | ORAL | Status: DC
  Administered 2015-10-18 – 2015-10-19 (×3): 10 meq via ORAL
  Filled 2015-10-18 (×3): qty 15

## 2015-10-18 MED ORDER — POTASSIUM CHLORIDE 10 MEQ/100ML IV SOLN
10.0000 meq | INTRAVENOUS | Status: AC
  Administered 2015-10-18 (×2): 10 meq via INTRAVENOUS
  Filled 2015-10-18 (×2): qty 100

## 2015-10-18 MED ORDER — POTASSIUM CHLORIDE CRYS ER 10 MEQ PO TBCR
10.0000 meq | EXTENDED_RELEASE_TABLET | Freq: Two times a day (BID) | ORAL | Status: DC
  Filled 2015-10-18: qty 1

## 2015-10-18 NOTE — Progress Notes (Signed)
Patient information reviewed and entered into eRehab system by Muaad Boehning, RN, CRRN, PPS Coordinator.  Information including medical coding and functional independence measure will be reviewed and updated through discharge.    

## 2015-10-18 NOTE — Progress Notes (Signed)
CRITICAL VALUE ALERT  Critical value received:  Potassium 2.7  Date of notification:  10/18/15  Time of notification:  0930  Critical value read back:Yes.    Nurse who received alert:  Ronny Bacon, RN  MD notified (1st page):  Marissa Nestle, PA  Time of first page:  0930  MD notified (2nd page):  Time of second page:  Responding MD:  Marissa Nestle, PA  Time MD responded:  0930

## 2015-10-18 NOTE — Progress Notes (Signed)
Dr. Anne Fu reviewed EKG and similar to old EKGs sine MI- replace K+.

## 2015-10-18 NOTE — Progress Notes (Signed)
CRITICAL VALUE ALERT  Critical value received:  Potassium 2.6  Date of notification:  10/18/15  Time of notification:  1253  Critical value read back:Yes.    Nurse who received alert:  Ronny Bacon, RN  MD notified (1st page):  Marissa Nestle, PA  Time of first page:  1300  MD notified (2nd page):  Time of second page:  Responding MD:  Marissa Nestle, PA  Time MD responded:  1311

## 2015-10-18 NOTE — Progress Notes (Signed)
Physical Therapy Session Note  Patient Details  Name: Eddie [name] MRN: [id number] Date of Birth: 10-11-1950  Today's Date: 10/18/2015 PT Individual Time: 5521-7471 PT Individual Time Calculation (min): 53 min   Short Term Goals: Week 1:  PT Short Term Goal 1 (Week 1): Patient will be able to ambulate at least 50 ft with Mod A with LRAD PT Short Term Goal 2 (Week 1): Patient will demonstrate bed mobility with Supervision A  PT Short Term Goal 3 (Week 1): Patient will propell WC with min A for 31f.   Skilled Therapeutic Interventions/Progress Updates:    Pt received in w/c at nursing station, agreeable to therapy session and no c/o pain.  W/c propulsion x150' with close supervision and verbal cues for w/c management.  Gait training x87' with mod/max assist from therapist and +2 for management of IV pole.  Pt ambulates with very narrow BOS, frequent scissoring, and weaving pattern.  Little to no protective responses noted with pt's frequent LOB.  Pt states he wants to use a cane, therapist provided education on using most appropriate LRAD and pt verbalized understanding.  Squat/pivot from w/c>nustep and nustep x 12 minutes at level 4 for cardiopulmonary endurance and reciprocal motion.  Pt propelled w/c x150' back to nursing station with supervision and verbal cues for navigation.  PT provided pt with basic cushion for w/c and pt able to perform sit<>squat and maintain squat position while PT positioned cushion.  Pt left seated in w/c in nursing station with QRB in place and needs met.   Therapy Documentation Precautions:  Precautions Precautions: Fall Precaution Comments: impulsive, net bed Restrictions Weight Bearing Restrictions: No   See Function Navigator for Current Functional Status.   Therapy/Group: Individual Therapy  CEarnest ConroyPenven-Crew 10/18/2015, 2:58 PM

## 2015-10-18 NOTE — Progress Notes (Signed)
Occupational Therapy Session Note  Patient Details  Name: Eddie [name] MRN: [id number] Date of Birth: 21-May-1951  Today's Date: 10/18/2015 OT Individual Time: 1205-1305 OT Individual Time Calculation (min): 60 min     Skilled Therapeutic Interventions/Progress Updates: Patient participated in skilled OT today as follows:   Utilized both UEs and cognitive skills and fair balance to effectively stand  to play checkers on the slanted checkers board on the table in the [employer].    He required tactile and or verbal cues to attend of see the red or black checkers on the lower portion of the board for 4 out of 5 moves.  When verbally cued that he still had checker pieces on the lower squares he asked, "where" but was able to visually locate them with more verbal or tactile cueing.    He was able to stand at the table with fair balance to play money bingo as well as count the money accurately.  At session's end, Patient asssisted back to a seated and belted position in his w/c and back to the nurses station for safety and supervision  Patient  Stated he wants more opportunities to get out of his vail bed and at least sit in his w/c.   He stated, "I cannot move my legs around and exercise in that bed."     Therapy Documentation Precautions:  Precautions Precautions: Fall Precaution Comments: impulsive, net bed Restrictions Weight Bearing Restrictions: No   Pain: Pain Assessment Pain Assessment: No/denies pain  See Function Navigator for Current Functional Status.   Therapy/Group: Individual Therapy  Bud Face [facility] 10/18/2015, 2:53 PM

## 2015-10-18 NOTE — Progress Notes (Signed)
Speech Language Pathology Daily Session Note  Patient Details  Name: Eddie [name] MRN: [id number] Date of Birth: 1951-03-06  Today's Date: 10/18/2015 SLP Individual Time: 1334-1430 SLP Individual Time Calculation (min): 56 min  Short Term Goals: Week 1: SLP Short Term Goal 1 (Week 1): Pt will sustain attion to a basic familiary task for ~3 minute intervals with mod verbal cues for redirection.  SLP Short Term Goal 2 (Week 1): Pt will answer orientation questions with 90% and mod verbal cues. SLP Short Term Goal 3 (Week 1): Pt will demonstrate basic problem solving for familiar tasks with mod verbal cues.  SLP Short Term Goal 4 (Week 1): Pt will complete swallow strengthening exercises to demonstrate readiness for repeat objective swallow assessment.  SLP Short Term Goal 5 (Week 1): Pt will recall swallow deficits, current diet and compenastory strategies with 80% and mod verbal cues.  SLP Short Term Goal 6 (Week 1): Pt will use compensatory swallow strategies with 80% accuracy and mod verbal cues.   Skilled Therapeutic Interventions: Skilled treatment session focused on addressing cognition and dysphagia goals. SLP facilitated session by providing Total assist for orientation to time, place and intellectual awareness of current deficits.  As session progressed SLP utilized repetition and external aids to fade cues to Min assist for date and Max assist for place and awareness of 2 physical and 1 cognitive change.  Patient sustained attention to basic tasks for ~30 seconds with Max assist multimodal cues for redirection.  Patient required Total assist to recall change in swallow function and Max assist verbal cues to utilize 2 swallows per spoonful of pudding-thick liquids.  No overt s/s of aspiration were noted.  Continue with current plan of care.   Function:  Eating Eating   Modified Consistency Diet: Yes Eating Assist Level: Supervision or verbal cues           Cognition Comprehension  Comprehension assist level: Understands basic 75 - 89% of the time/ requires cueing 10 - 24% of the time  Expression   Expression assist level: Expresses basic 90% of the time/requires cueing < 10% of the time.  Social Interaction Social Interaction assist level: Interacts appropriately with others with medication or extra time (anti-anxiety, antidepressant).  Problem Solving Problem solving assist level: Solves basic 75 - 89% of the time/requires cueing 10 - 24% of the time  Memory Memory assist level: Recognizes or recalls 25 - 49% of the time/requires cueing 50 - 75% of the time    Pain Pain Assessment Pain Assessment: No/denies pain  Therapy/Group: Individual Therapy  Charlane Ferretti., CCC-SLP [phone]  Katalaya Beel 10/18/2015, 2:30 PM

## 2015-10-18 NOTE — IPOC Note (Addendum)
Overall Plan of Care [facility]) Patient Details Name: Eddie [name] MRN: [id number] DOB: Jan 28, 1951  Admitting Diagnosis: L FRONTAL INFACT  Hospital Problems: Principal Problem:   Ischemic stroke of frontal lobe (HCC) Active Problems:   Acute on chronic systolic congestive heart failure (HCC)   Dysphagia as late effect of cerebrovascular disease   Essential hypertension   Type 2 diabetes mellitus with complication (HCC)   Anoxic encephalopathy (HCC)     Functional Problem List: Nursing Behavior, Bladder, Bowel, Edema, Endurance, Medication Management, Motor, Nutrition, Perception, Safety  PT Balance, Behavior, Endurance, Motor, Perception, Safety, Sensory  OT Balance, Behavior, Cognition, Endurance, Motor, Safety, Sensory, Vision  SLP Cognition, Safety  TR         Basic ADL's: OT Grooming, Bathing, Dressing, Toileting     Advanced  ADL's: OT       Transfers: PT Bed Mobility, Bed to Chair, Car, [employer], Civil Service fast streamer, Research scientist (life sciences): PT Ambulation, Psychologist, prison and probation services, Stairs     Additional Impairments: OT Fuctional Use of Upper Extremity  SLP Swallowing      TR      Anticipated Outcomes Item Anticipated Outcome  Self Feeding n/a  Swallowing  Mod A with dysphagia 2 and honey thick liquids   Basic self-care  supervision-Mod I  Toileting  Mod I   Bathroom Transfers supervision  Bowel/Bladder  Pt will manage bowel and bladder with Max assist   Transfers  Supervision A   Locomotion  Supervision with RW and WC.   Communication  Mod A  Cognition  Mod A  Pain  Pt will rate pain at 4 or less on a scale of 0-10.   Safety/Judgment  Pt will follow safety precautions with max assist and cues    Therapy Plan: PT Intensity: Minimum of 1-2 x/day ,45 to 90 minutes PT Frequency: 5 out of 7 days PT Duration Estimated Length of Stay: 10-18 days  OT Intensity: Minimum of 1-2 x/day, 45 to 90 minutes OT Frequency: 5 out of 7 days OT Duration/Estimated  Length of Stay: 12-15 days SLP Intensity: Minumum of 1-2 x/day, 30 to 90 minutes SLP Frequency: 3 to 5 out of 7 days       Team Interventions: Nursing Interventions Patient/Family Education, Pain Management, Bladder Management, Bowel Management, Disease Management/Prevention, Dysphagia/Aspiration Precaution Training, Cognitive Remediation/Compensation, Psychosocial Support, Medication Management, Discharge Planning  PT interventions Ambulation/gait training, Balance/vestibular training, Cognitive remediation/compensation, Community reintegration, Discharge planning, Disease management/prevention, DME/adaptive equipment instruction, Functional mobility training, Neuromuscular re-education, Pain management, Patient/family education, Psychosocial support, Skin care/wound management, Stair training, Therapeutic Activities, Therapeutic Exercise, UE/LE Strength taining/ROM, UE/LE Coordination activities, Visual/perceptual remediation/compensation, Wheelchair propulsion/positioning  OT Interventions Warden/ranger, Cognitive remediation/compensation, Community reintegration, Discharge planning, Neuromuscular re-education, Functional mobility training, Functional electrical stimulation, Patient/family education, Psychosocial support, DME/adaptive equipment instruction, Self Care/advanced ADL retraining, Splinting/orthotics, Visual/perceptual remediation/compensation, UE/LE Coordination activities, UE/LE Strength taining/ROM, Therapeutic Exercise, Therapeutic Activities  SLP Interventions Cognitive remediation/compensation, Cueing hierarchy, Dysphagia/aspiration precaution training, Patient/family education, Oral motor exercises, Therapeutic Exercise, Therapeutic Activities, Speech/Language facilitation, Environmental controls, Functional tasks  TR Interventions    SW/CM Interventions  Discharge Planning, Psychosocial Assessment & Pt/Family Education    Team Discharge Planning: Destination: PT-Home  ,OT- Home , SLP-Home Projected Follow-up: PT-Home health PT, OT-   (TBD), SLP-Home Health SLP Projected Equipment Needs: PT-Rolling walker with 5" wheels, Wheelchair (measurements), Wheelchair cushion (measurements), OT- To be determined, SLP-To be determined Equipment Details: PT- , OT-  Patient/family involved in discharge planning: PT- Patient, Family Adult nurse,  OT-Patient, SLP-Patient  MD ELOS: 13-18d Medical Rehab Prognosis:  Good Assessment: 65 y.o. male who was admitted on 09/28/15 with chest pain with ST elevation due to anterior STEMI and developed unresponsiveness enroute to ED. He was intubated and treated with brief compressions and epi for PEA. He was taken to cath lab urgently and DES placed to LAD. 2D echo with EF 10-15%. He was treated with hypothermia protocol and during rewarming was noted to have agitation as well as left sided weakness on 03/13. 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 He developed copious oral secretions due to MSSA HCAP and was started on Vanc/Fortaz for treatment. Repeat echo with EF 30-35% with diffuse hypokinesis and no PFO or thrombus. EEG ordered due to fluctuating MS and showed generalized background slowing due to "pharmacologic, hypoxic, toxic-metabolic or other diffuse physiologic etiology"   Now requiring 24/7 Rehab RN,MD, as well as CIR level PT, OT and SLP.  Treatment team will focus on ADLs and mobility with goals set at mod I mob, Mod A aphasia  See Team Conference Notes for weekly updates to the plan of care

## 2015-10-18 NOTE — Progress Notes (Signed)
Social Work Patient ID: Eddie [name], male   DOB: 1950/12/06, 65 y.o.   MRN: [id number] Have left message for his wife since he is unable to provide the information for this worker's assessment. Will await return call.

## 2015-10-18 NOTE — Care Management Note (Signed)
[facility] Individual Statement of Services  Patient Name:  Eddie [name]  Date:  10/18/2015  Welcome to the [facility].  Our goal is to provide you with an individualized program based on your diagnosis and situation, designed to meet your specific needs.  With this comprehensive rehabilitation program, you will be expected to participate in at least 3 hours of rehabilitation therapies Monday-Friday, with modified therapy programming on the weekends.  Your rehabilitation program will include the following services:  Physical Therapy (PT), Occupational Therapy (OT), Speech Therapy (ST), 24 hour per day rehabilitation nursing, Therapeutic Recreaction (TR), Neuropsychology, Case Management (Social Worker), Rehabilitation Medicine, Nutrition Services and Pharmacy Services  Weekly team conferences will be held on Wednesday to discuss your progress.  Your Social Worker will talk with you frequently to get your input and to update you on team discussions.  Team conferences with you and your family in attendance may also be held.  Expected length of stay: 10-18 days    Overall anticipated outcome: supervision/mod/i level  Depending on your progress and recovery, your program may change. Your Social Worker will coordinate services and will keep you informed of any changes. Your Social Worker's name and contact numbers are listed  below.  The following services may also be recommended but are not provided by the [facility]:   Driving Evaluations  Home Health Rehabiltiation Services  Outpatient Rehabilitation Services  Vocational Rehabilitation   Arrangements will be made to provide these services after discharge if needed.  Arrangements include referral to agencies that provide these services.  Your insurance has been verified to be:  BC[employer] Your primary doctor is:    Pertinent information will be shared with your doctor and your  insurance company.  Social Worker:  Dossie Der, SW [phone] or [phone]  Information discussed with and copy given to patient by: Lucy Chris, 10/18/2015, 2:19 PM

## 2015-10-18 NOTE — Progress Notes (Signed)
Subjective/Complaints: No issues overnite  ROS- no bowel or bladder issues reported, cold last noc  Objective: Vital Signs: Blood pressure 142/71, pulse 471, temperature 98.1 F (36.7 C), temperature source Oral, resp. rate 18, height _0  (1.702 m), weight 64.003 kg (141 lb 1.6 oz), SpO2 100 %. Ct Head Wo Contrast  10/16/2015  CLINICAL DATA:  Fall from wheelchair today. Pt hit head on right side. EXAM: CT HEAD WITHOUT CONTRAST TECHNIQUE: Contiguous axial images were obtained from the base of the skull through the vertex without intravenous contrast. COMPARISON:  10/14/2015 FINDINGS: The ventricles are normal in size and configuration. There are no parenchymal masses or mass effect. There is no evidence of a recent cortical infarct. There is a small focal hypoattenuation in the right base a ganglia consistent with an old lacune infarct. There are 2 discrete small foci of hypoattenuation in the right and left frontal lobe subcortical white matter consistent with old lacune infarcts. There are no extra-axial masses or abnormal fluid collections. There is no intracranial hemorrhage. Visualized sinuses and mastoid air cells are clear. No skull fracture. IMPRESSION: 1. No acute intracranial abnormalities. No change from the prior CT scan of the brain. No skull fracture. Electronically Signed   By: Lajean Manes M.D.   On: 10/16/2015 21:49   Results for orders placed or performed during the hospital encounter of 10/15/15 (from the past 72 hour(s))  Glucose, capillary     Status: Abnormal   Collection Time: 10/15/15  9:39 PM  Result Value Ref Range   Glucose-Capillary 386 (H) 65 - 99 mg/dL  Glucose, capillary     Status: Abnormal   Collection Time: 10/16/15 12:57 AM  Result Value Ref Range   Glucose-Capillary 199 (H) 65 - 99 mg/dL  CBC WITH DIFFERENTIAL     Status: Abnormal   Collection Time: 10/16/15  8:27 AM  Result Value Ref Range   WBC 14.1 (H) 4.0 - 10.5 K/uL   RBC 4.01 (L) 4.22 - 5.81 MIL/uL    Hemoglobin 9.9 (L) 13.0 - 17.0 g/dL   HCT 32.8 (L) 39.0 - 52.0 %   MCV 81.8 78.0 - 100.0 fL   MCH 24.7 (L) 26.0 - 34.0 pg   MCHC 30.2 30.0 - 36.0 g/dL   RDW 15.1 11.5 - 15.5 %   Platelets 722 (H) 150 - 400 K/uL   Neutrophils Relative % 89 %   Neutro Abs 12.5 (H) 1.7 - 7.7 K/uL   Lymphocytes Relative 8 %   Lymphs Abs 1.1 0.7 - 4.0 K/uL   Monocytes Relative 3 %   Monocytes Absolute 0.4 0.1 - 1.0 K/uL   Eosinophils Relative 0 %   Eosinophils Absolute 0.0 0.0 - 0.7 K/uL   Basophils Relative 0 %   Basophils Absolute 0.0 0.0 - 0.1 K/uL  Comprehensive metabolic panel     Status: Abnormal   Collection Time: 10/16/15  8:27 AM  Result Value Ref Range   Sodium 145 135 - 145 mmol/L   Potassium 3.4 (L) 3.5 - 5.1 mmol/L   Chloride 107 101 - 111 mmol/L   CO2 24 22 - 32 mmol/L   Glucose, Bld 162 (H) 65 - 99 mg/dL   BUN 43 (H) 6 - 20 mg/dL   Creatinine, Ser 1.21 0.61 - 1.24 mg/dL   Calcium 8.9 8.9 - 10.3 mg/dL   Total Protein 8.1 6.5 - 8.1 g/dL   Albumin 3.0 (L) 3.5 - 5.0 g/dL   AST 27 15 - 41 U/L  ALT 9 (L) 17 - 63 U/L   Alkaline Phosphatase 100 38 - 126 U/L   Total Bilirubin 0.7 0.3 - 1.2 mg/dL   GFR calc non Af Amer >60 >60 mL/min   GFR calc Af Amer >60 >60 mL/min    Comment: (NOTE) The eGFR has been calculated using the CKD EPI equation. This calculation has not been validated in all clinical situations. eGFR's persistently <60 mL/min signify possible Chronic Kidney Disease.    Anion gap 14 5 - 15  Glucose, capillary     Status: Abnormal   Collection Time: 10/16/15 10:46 PM  Result Value Ref Range   Glucose-Capillary 196 (H) 65 - 99 mg/dL  Glucose, capillary     Status: Abnormal   Collection Time: 10/17/15  7:00 AM  Result Value Ref Range   Glucose-Capillary 120 (H) 65 - 99 mg/dL  Glucose, capillary     Status: Abnormal   Collection Time: 10/17/15 12:56 PM  Result Value Ref Range   Glucose-Capillary 318 (H) 65 - 99 mg/dL  Renal function panel     Status: Abnormal    Collection Time: 10/17/15  3:38 PM  Result Value Ref Range   Sodium 141 135 - 145 mmol/L   Potassium 3.0 (L) 3.5 - 5.1 mmol/L   Chloride 105 101 - 111 mmol/L   CO2 25 22 - 32 mmol/L   Glucose, Bld 284 (H) 65 - 99 mg/dL   BUN 40 (H) 6 - 20 mg/dL   Creatinine, Ser 1.33 (H) 0.61 - 1.24 mg/dL   Calcium 8.5 (L) 8.9 - 10.3 mg/dL   Phosphorus 2.6 2.5 - 4.6 mg/dL   Albumin 2.7 (L) 3.5 - 5.0 g/dL   GFR calc non Af Amer 55 (L) >60 mL/min   GFR calc Af Amer >60 >60 mL/min    Comment: (NOTE) The eGFR has been calculated using the CKD EPI equation. This calculation has not been validated in all clinical situations. eGFR's persistently <60 mL/min signify possible Chronic Kidney Disease.    Anion gap 11 5 - 15  Glucose, capillary     Status: Abnormal   Collection Time: 10/17/15  4:33 PM  Result Value Ref Range   Glucose-Capillary 256 (H) 65 - 99 mg/dL      General: No acute distress Mood and affect are appropriate Heart: Regular rate and rhythm no rubs murmurs or extra sounds Lungs: Clear to auscultation, breathing unlabored, no rales or wheezes Abdomen: Positive bowel sounds, soft nontender to palpation, nondistended Extremities: No clubbing, cyanosis, or edema Skin: No evidence of breakdown, no evidence of rash Neurologic: Cranial nerves II through XII intact, motor strength is 5/5 in bilateral deltoid, bicep, tricep, grip, hip flexor, knee extensors, ankle dorsiflexor and plantar flexor Sensory exam normal sensation to light touch and proprioception in bilateral upper and lower extremities  Cerebellar exam mild dysmetria RUE Musculoskeletal: Full range of motion in all 4 extremities. No joint swelling   Assessment/Plan: 1. Functional deficits secondary to Cognitive, mobility, and functional deficits secondary to anoxic encephalopathy and right frontal infarct which require 3+ hours per day of interdisciplinary therapy in a comprehensive inpatient rehab setting. Physiatrist is  providing close team supervision and 24 hour management of active medical problems listed below. Physiatrist and rehab team continue to assess barriers to discharge/monitor patient progress toward functional and medical goals. FIM: Function - Bathing Position: Wheelchair/chair at sink Body parts bathed by patient: Right arm, Left arm, Chest, Abdomen, Front perineal area, Buttocks, Right upper leg, Left upper  leg Body parts bathed by helper: Right lower leg, Left lower leg, Back Assist Level: Touching or steadying assistance(Pt > 75%)  Function- Upper Body Dressing/Undressing What is the patient wearing?: Hospital gown Assist Level: Touching or steadying assistance(Pt > 75%) Function - Lower Body Dressing/Undressing What is the patient wearing?: Hospital Gown, Non-skid slipper socks (brief) Position: Wheelchair/chair at sink Non-skid slipper socks- Performed by helper: Don/doff right sock, Don/doff left sock Assist for footwear: Dependant Assist for lower body dressing: Touching or steadying assistance (Pt > 75%)  Function - Toileting Toileting activity did not occur:  (did not need to complete toileting task with OT)  Function - Toilet Transfers Toilet transfer assistive device: Grab bar Assist level to toilet: Touching or steadying assistance (Pt > 75%) Assist level from toilet: Touching or steadying assistance (Pt > 75%)  Function - Chair/bed transfer Chair/bed transfer method: Stand pivot Chair/bed transfer assist level: Moderate assist (Pt 50 - 74%/lift or lower) Chair/bed transfer assistive device: Armrests Chair/bed transfer details: Verbal cues for precautions/safety, Tactile cues for posture  Function - Locomotion: Ambulation Assistive device: No device Max distance: 20 Assist level: Moderate assist (Pt 50 - 74%)  Function - Comprehension Comprehension: Auditory Comprehension assist level: Understands basic 75 - 89% of the time/ requires cueing 10 - 24% of the  time  Function - Expression Expression: Verbal Expression assist level: Expresses basic 90% of the time/requires cueing < 10% of the time.  Function - Social Interaction Social Interaction assist level: Interacts appropriately with others with medication or extra time (anti-anxiety, antidepressant).  Function - Problem Solving Problem solving assist level: Solves basic 75 - 89% of the time/requires cueing 10 - 24% of the time  Function - Memory Memory assist level: Recognizes or recalls 50 - 74% of the time/requires cueing 25 - 49% of the time Patient normally able to recall (first 3 days only): Location of own room, Staff names and faces, That he or she is in a hospital  Medical Problem List and Plan: 1. Cognitive, mobility, and functional deficits secondary to anoxic encephalopathy and right frontal infarct- cont CIR 2. DVT Prophylaxis/Anticoagulation: Pharmaceutical: Lovenox 3. Pain Management: tylenol prn 4. Mood: LCSW to follow for evaluation and support.  5. Neuropsych: This patient is not fully capable of making decisions on his own behalf. 6. Skin/Wound Care: Routine pressure relief measure.  7. Fluids/Electrolytes/Nutrition: Monitor I/O.  8. Anterior STEMI: to continue ASA, Brilinta, BB and statin. 9. Acute systolic heart failure: Has been compensated. Check daily weights and monitor for signs of overload. Continue aldactone, coreg and lisinopril.  . 10. 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 Neurology recommends coumadin plus plavix in one month.  11. MSSA PNA: IV ancef discontinued today--has completed 12 day antibiotic regimen. No sign of recurrence -pt is an aspiration risk--observe aspiration precautions 12. Acute on chronic renal failure: Diuresis discontinued with improvement. Continue to monitor as on modified diet without liquids. May need IVF at nights to maintain adequate hydration.  13. Anemia: Slowly improving. Continue to monitor for signs of infection.  Check CBC in am.  14. DM type 2: New diagnosis with Hgb A1C-6.9. BS uncontrolled due to tube feeds. Continue lantus daily and will transition to oral medication as intake improves. Monitor BS with ac/hs checks and use SSI for elevated BS.  15.  HypoK add KCL , f/u BMET in 2 d LOS (Days) 3 A FACE TO [place] E 10/18/2015, 7:51 AM

## 2015-10-18 NOTE — Progress Notes (Signed)
Patient has had progressive drop in K+ since admission Friday pm.  Will recheck to confirm.  Ordered runs of K X 3. EKG for baseline indicates acute MI and paged Nada Boozer to compare with 3/18 and questioned changes.  Patient currently without symptoms but hypokalemic.  She will have cardiologist on call review and call back as needed.

## 2015-10-19 ENCOUNTER — Inpatient Hospital Stay (HOSPITAL_COMMUNITY): Payer: BLUE CROSS/BLUE SHIELD | Admitting: Physical Therapy

## 2015-10-19 ENCOUNTER — Inpatient Hospital Stay (HOSPITAL_COMMUNITY): Payer: BLUE CROSS/BLUE SHIELD | Admitting: Speech Pathology

## 2015-10-19 ENCOUNTER — Inpatient Hospital Stay (HOSPITAL_COMMUNITY): Payer: BLUE CROSS/BLUE SHIELD

## 2015-10-19 LAB — GLUCOSE, CAPILLARY
GLUCOSE-CAPILLARY: 173 mg/dL — AB (ref 65–99)
GLUCOSE-CAPILLARY: 130 mg/dL — AB (ref 65–99)
GLUCOSE-CAPILLARY: 197 mg/dL — AB (ref 65–99)
GLUCOSE-CAPILLARY: 114 mg/dL — AB (ref 65–99)
Glucose-Capillary: 221 mg/dL — ABNORMAL HIGH (ref 65–99)

## 2015-10-19 LAB — RENAL FUNCTION PANEL
ANION GAP: 10 (ref 5–15)
Albumin: 2.6 g/dL — ABNORMAL LOW (ref 3.5–5.0)
BUN: 21 mg/dL — ABNORMAL HIGH (ref 6–20)
CALCIUM: 8.4 mg/dL — AB (ref 8.9–10.3)
CHLORIDE: 104 mmol/L (ref 101–111)
CO2: 25 mmol/L (ref 22–32)
CREATININE: 0.96 mg/dL (ref 0.61–1.24)
Glucose, Bld: 135 mg/dL — ABNORMAL HIGH (ref 65–99)
Phosphorus: 2.6 mg/dL (ref 2.5–4.6)
Potassium: 3.3 mmol/L — ABNORMAL LOW (ref 3.5–5.1)
Sodium: 139 mmol/L (ref 135–145)

## 2015-10-19 MED ORDER — POTASSIUM CHLORIDE 20 MEQ/15ML (10%) PO SOLN
20.0000 meq | Freq: Three times a day (TID) | ORAL | Status: DC
  Administered 2015-10-19 – 2015-10-25 (×18): 20 meq via ORAL
  Filled 2015-10-19 (×20): qty 15

## 2015-10-19 NOTE — Progress Notes (Signed)
Occupational Therapy Session Note  Patient Details  Name: Eddie [name] MRN: [id number] Date of Birth: 03/05/1951  Today's Date: 10/19/2015 OT Individual Time: 1300-1400 OT Individual Time Calculation (min): 60 min   Short Term Goals: Week 1:  OT Short Term Goal 1 (Week 1): Pt will demonstrate improved dynamic standing balance by engaging in functional activity in standing for 5 minutes with min A for balance OT Short Term Goal 2 (Week 1): Pt will complete toilet task with supervision OT Short Term Goal 3 (Week 1): Pt will complete LB dressing with min A OT Short Term Goal 4 (Week 1): Pt will complete toilet transfer with supervision  Skilled Therapeutic Interventions/Progress Updates: ADL-retraining at shower level with focus on adapted bathing/dressing skills, functional mobility using RW, transfers, dynamic standing balance and safety awareness.   Pt received seated in w/c with QRB attached and pt speaking to his wife on the phone.  OT reoriented pt to goals of treatment and advised shower d/t body odor.   Pt complied with directions gathered his clothing using RW and min assist (steadying) while bending below his waist.   Pt ambulated to bathroom and completed transfer to bench with min instructional cues.   After setup to provide supplies and mod instructional cues on use of hand shower, pt bathed, sitting and standing at bench using grab bar while standing to wash buttocks and peri-area.  Pt returned to w/c to dress and required min assist to lace pants and intermittent steadying assist during dynamic sitting tasks.   Pt was left in w/c as RN tech arriving to check vitals.   QRB attached with call light and urinal placed within reach at end of session.     Therapy Documentation Precautions:  Precautions Precautions: Fall Precaution Comments: impulsive, net bed Restrictions Weight Bearing Restrictions: No   Pain: Pain Assessment Pain Assessment: No/denies pain Pain Score: 0-No pain    See Function Navigator for Current Functional Status.   Therapy/Group: Individual Therapy  Elster Corbello 10/19/2015, 2:13 PM

## 2015-10-19 NOTE — Progress Notes (Signed)
Physical Therapy Note  Patient Details  Name: Eddie [name] MRN: [id number] Date of Birth: May 05, 1951 Today's Date: 10/19/2015    Time: 1030-1145 45 minutes  1:1 No c/o pain. Pt performed gait throughout unit up to 200' with mod A without AD. Pt with ataxic, scissoring gait, requires cues to widen BOS, mod A for frequent LOB.  Standing balance with horseshoe toss with mod A for dynamic standing balance. Pt with good standing tolerance, able to stand x 5 minutes before seated rest break.  [place] exercises performed and pt given HEP handout to perform at home. Pt able to follow instructions to complete program.  Seated hamstring and piriformis stretches due to pt c/o hip tightness, pt able to follow visual and verbal cues to perform.  Gait sideways and backward stepping with mod A to improve balance reactions.  Nustep x 5 minutes for aerobic endurance and strengthening.  Pt attempted to wheel w/c into bathroom and transfer without assist.  Pt educated on importance of calling for help. Pt performed toilet transfer with close supervision.  Pt instructed to call when he was finished toileting, PT stood outside door to make sure he called.  Pt called after he transferred back into w/c. Pt educated again to call before any transfers.  RN and nurse tech aware.   Makeila Yamaguchi 10/19/2015, 11:45 AM

## 2015-10-19 NOTE — Progress Notes (Signed)
Subjective/Complaints: Stays cold, wants to drink coffee and broth, discussed with SLP, still on pudding thick  ROS- no bowel or bladder issues reported, cold last noc  Objective: Vital Signs: Blood pressure 141/86, pulse 74, temperature 97.6 F (36.4 C), temperature source Oral, resp. rate 17, height _0  (1.702 m), weight 64.12 kg (141 lb 5.7 oz), SpO2 100 %. No results found. Results for orders placed or performed during the hospital encounter of 10/15/15 (from the past 72 hour(s))  CBC WITH DIFFERENTIAL     Status: Abnormal   Collection Time: 10/16/15  8:27 AM  Result Value Ref Range   WBC 14.1 (H) 4.0 - 10.5 K/uL   RBC 4.01 (L) 4.22 - 5.81 MIL/uL   Hemoglobin 9.9 (L) 13.0 - 17.0 g/dL   HCT 32.8 (L) 39.0 - 52.0 %   MCV 81.8 78.0 - 100.0 fL   MCH 24.7 (L) 26.0 - 34.0 pg   MCHC 30.2 30.0 - 36.0 g/dL   RDW 15.1 11.5 - 15.5 %   Platelets 722 (H) 150 - 400 K/uL   Neutrophils Relative % 89 %   Neutro Abs 12.5 (H) 1.7 - 7.7 K/uL   Lymphocytes Relative 8 %   Lymphs Abs 1.1 0.7 - 4.0 K/uL   Monocytes Relative 3 %   Monocytes Absolute 0.4 0.1 - 1.0 K/uL   Eosinophils Relative 0 %   Eosinophils Absolute 0.0 0.0 - 0.7 K/uL   Basophils Relative 0 %   Basophils Absolute 0.0 0.0 - 0.1 K/uL  Comprehensive metabolic panel     Status: Abnormal   Collection Time: 10/16/15  8:27 AM  Result Value Ref Range   Sodium 145 135 - 145 mmol/L   Potassium 3.4 (L) 3.5 - 5.1 mmol/L   Chloride 107 101 - 111 mmol/L   CO2 24 22 - 32 mmol/L   Glucose, Bld 162 (H) 65 - 99 mg/dL   BUN 43 (H) 6 - 20 mg/dL   Creatinine, Ser 1.21 0.61 - 1.24 mg/dL   Calcium 8.9 8.9 - 10.3 mg/dL   Total Protein 8.1 6.5 - 8.1 g/dL   Albumin 3.0 (L) 3.5 - 5.0 g/dL   AST 27 15 - 41 U/L   ALT 9 (L) 17 - 63 U/L   Alkaline Phosphatase 100 38 - 126 U/L   Total Bilirubin 0.7 0.3 - 1.2 mg/dL   GFR calc non Af Amer >60 >60 mL/min   GFR calc Af Amer >60 >60 mL/min    Comment: (NOTE) The eGFR has been calculated using the CKD  EPI equation. This calculation has not been validated in all clinical situations. eGFR's persistently <60 mL/min signify possible Chronic Kidney Disease.    Anion gap 14 5 - 15  Glucose, capillary     Status: Abnormal   Collection Time: 10/16/15 11:20 AM  Result Value Ref Range   Glucose-Capillary 269 (H) 65 - 99 mg/dL   Comment 1 Notify RN   Glucose, capillary     Status: Abnormal   Collection Time: 10/16/15 10:46 PM  Result Value Ref Range   Glucose-Capillary 196 (H) 65 - 99 mg/dL  Glucose, capillary     Status: Abnormal   Collection Time: 10/17/15  7:00 AM  Result Value Ref Range   Glucose-Capillary 120 (H) 65 - 99 mg/dL  Glucose, capillary     Status: Abnormal   Collection Time: 10/17/15 12:56 PM  Result Value Ref Range   Glucose-Capillary 318 (H) 65 - 99 mg/dL  Renal function panel  Status: Abnormal   Collection Time: 10/17/15  3:38 PM  Result Value Ref Range   Sodium 141 135 - 145 mmol/L   Potassium 3.0 (L) 3.5 - 5.1 mmol/L   Chloride 105 101 - 111 mmol/L   CO2 25 22 - 32 mmol/L   Glucose, Bld 284 (H) 65 - 99 mg/dL   BUN 40 (H) 6 - 20 mg/dL   Creatinine, Ser 1.33 (H) 0.61 - 1.24 mg/dL   Calcium 8.5 (L) 8.9 - 10.3 mg/dL   Phosphorus 2.6 2.5 - 4.6 mg/dL   Albumin 2.7 (L) 3.5 - 5.0 g/dL   GFR calc non Af Amer 55 (L) >60 mL/min   GFR calc Af Amer >60 >60 mL/min    Comment: (NOTE) The eGFR has been calculated using the CKD EPI equation. This calculation has not been validated in all clinical situations. eGFR's persistently <60 mL/min signify possible Chronic Kidney Disease.    Anion gap 11 5 - 15  Glucose, capillary     Status: Abnormal   Collection Time: 10/17/15  4:33 PM  Result Value Ref Range   Glucose-Capillary 256 (H) 65 - 99 mg/dL  Glucose, capillary     Status: Abnormal   Collection Time: 10/17/15  9:41 PM  Result Value Ref Range   Glucose-Capillary 154 (H) 65 - 99 mg/dL  Glucose, capillary     Status: None   Collection Time: 10/18/15  7:21 AM   Result Value Ref Range   Glucose-Capillary 86 65 - 99 mg/dL  Renal function panel     Status: Abnormal   Collection Time: 10/18/15  9:32 AM  Result Value Ref Range   Sodium 140 135 - 145 mmol/L   Potassium 2.7 (LL) 3.5 - 5.1 mmol/L    Comment: CRITICAL RESULT CALLED TO, READ BACK BY AND VERIFIED WITH: REARDON,W RN @ 1046 10/18/15 LEONARD,A    Chloride 102 101 - 111 mmol/L   CO2 28 22 - 32 mmol/L   Glucose, Bld 153 (H) 65 - 99 mg/dL   BUN 26 (H) 6 - 20 mg/dL   Creatinine, Ser 0.98 0.61 - 1.24 mg/dL   Calcium 8.2 (L) 8.9 - 10.3 mg/dL   Phosphorus 2.6 2.5 - 4.6 mg/dL   Albumin 2.6 (L) 3.5 - 5.0 g/dL   GFR calc non Af Amer >60 >60 mL/min   GFR calc Af Amer >60 >60 mL/min    Comment: (NOTE) The eGFR has been calculated using the CKD EPI equation. This calculation has not been validated in all clinical situations. eGFR's persistently <60 mL/min signify possible Chronic Kidney Disease.    Anion gap 10 5 - 15  Basic metabolic panel     Status: Abnormal   Collection Time: 10/18/15 11:09 AM  Result Value Ref Range   Sodium 139 135 - 145 mmol/L   Potassium 2.6 (LL) 3.5 - 5.1 mmol/L    Comment: CRITICAL RESULT CALLED TO, READ BACK BY AND VERIFIED WITH: W.REARDON,RN 10/18/15 1253 BY BSLADE    Chloride 103 101 - 111 mmol/L   CO2 25 22 - 32 mmol/L   Glucose, Bld 185 (H) 65 - 99 mg/dL   BUN 26 (H) 6 - 20 mg/dL   Creatinine, Ser 1.04 0.61 - 1.24 mg/dL   Calcium 8.1 (L) 8.9 - 10.3 mg/dL   GFR calc non Af Amer >60 >60 mL/min   GFR calc Af Amer >60 >60 mL/min    Comment: (NOTE) The eGFR has been calculated using the CKD EPI equation. This calculation  has not been validated in all clinical situations. eGFR's persistently <60 mL/min signify possible Chronic Kidney Disease.    Anion gap 11 5 - 15  Glucose, capillary     Status: Abnormal   Collection Time: 10/18/15 11:29 AM  Result Value Ref Range   Glucose-Capillary 194 (H) 65 - 99 mg/dL   Comment 1 Notify RN   Glucose, capillary      Status: Abnormal   Collection Time: 10/18/15  4:10 PM  Result Value Ref Range   Glucose-Capillary 274 (H) 65 - 99 mg/dL   Comment 1 Notify RN   Basic metabolic panel     Status: Abnormal   Collection Time: 10/18/15  5:36 PM  Result Value Ref Range   Sodium 136 135 - 145 mmol/L   Potassium 2.8 (L) 3.5 - 5.1 mmol/L   Chloride 100 (L) 101 - 111 mmol/L   CO2 25 22 - 32 mmol/L   Glucose, Bld 316 (H) 65 - 99 mg/dL   BUN 25 (H) 6 - 20 mg/dL   Creatinine, Ser 0.92 0.61 - 1.24 mg/dL   Calcium 8.1 (L) 8.9 - 10.3 mg/dL   GFR calc non Af Amer >60 >60 mL/min   GFR calc Af Amer >60 >60 mL/min    Comment: (NOTE) The eGFR has been calculated using the CKD EPI equation. This calculation has not been validated in all clinical situations. eGFR's persistently <60 mL/min signify possible Chronic Kidney Disease.    Anion gap 11 5 - 15  Comprehensive metabolic panel     Status: Abnormal   Collection Time: 10/18/15 10:43 PM  Result Value Ref Range   Sodium 137 135 - 145 mmol/L   Potassium 3.4 (L) 3.5 - 5.1 mmol/L    Comment: DELTA CHECK NOTED   Chloride 105 101 - 111 mmol/L   CO2 23 22 - 32 mmol/L   Glucose, Bld 244 (H) 65 - 99 mg/dL   BUN 24 (H) 6 - 20 mg/dL   Creatinine, Ser 1.06 0.61 - 1.24 mg/dL   Calcium 8.1 (L) 8.9 - 10.3 mg/dL   Total Protein 6.5 6.5 - 8.1 g/dL   Albumin 2.5 (L) 3.5 - 5.0 g/dL   AST 17 15 - 41 U/L   ALT 8 (L) 17 - 63 U/L   Alkaline Phosphatase 76 38 - 126 U/L   Total Bilirubin 0.4 0.3 - 1.2 mg/dL   GFR calc non Af Amer >60 >60 mL/min   GFR calc Af Amer >60 >60 mL/min    Comment: (NOTE) The eGFR has been calculated using the CKD EPI equation. This calculation has not been validated in all clinical situations. eGFR's persistently <60 mL/min signify possible Chronic Kidney Disease.    Anion gap 9 5 - 15  Glucose, capillary     Status: Abnormal   Collection Time: 10/19/15  6:57 AM  Result Value Ref Range   Glucose-Capillary 130 (H) 65 - 99 mg/dL      General: No  acute distress Mood and affect are appropriate Heart: Regular rate and rhythm no rubs murmurs or extra sounds Lungs: Clear to auscultation, breathing unlabored, no rales or wheezes Abdomen: Positive bowel sounds, soft nontender to palpation, nondistended Extremities: No clubbing, cyanosis, or edema Skin: No evidence of breakdown, no evidence of rash Neurologic: Cranial nerves II through XII intact, motor strength is 5/5 in bilateral deltoid, bicep, tricep, grip, hip flexor, knee extensors, ankle dorsiflexor and plantar flexor Sensory exam normal sensation to light touch and proprioception in bilateral  upper and lower extremities  Cerebellar exam mild dysmetria RUE Musculoskeletal: Full range of motion in all 4 extremities. No joint swelling   Assessment/Plan: 1. Functional deficits secondary to Cognitive, mobility, and functional deficits secondary to anoxic encephalopathy and right frontal infarct which require 3+ hours per day of interdisciplinary therapy in a comprehensive inpatient rehab setting. Physiatrist is providing close team supervision and 24 hour management of active medical problems listed below. Physiatrist and rehab team continue to assess barriers to discharge/monitor patient progress toward functional and medical goals. FIM: Function - Bathing Position: Wheelchair/chair at sink Body parts bathed by patient: Right arm, Left arm, Chest, Abdomen, Front perineal area, Buttocks, Right upper leg, Left upper leg Body parts bathed by helper: Right lower leg, Left lower leg, Back Assist Level: Touching or steadying assistance(Pt > 75%)  Function- Upper Body Dressing/Undressing What is the patient wearing?: Hospital gown Assist Level: Touching or steadying assistance(Pt > 75%) Function - Lower Body Dressing/Undressing What is the patient wearing?: Hospital Gown, Non-skid slipper socks (brief) Position: Wheelchair/chair at sink Non-skid slipper socks- Performed by helper:  Don/doff right sock, Don/doff left sock Assist for footwear: Dependant Assist for lower body dressing: Touching or steadying assistance (Pt > 75%)  Function - Toileting Toileting activity did not occur:  (did not need to complete toileting task with OT) Toileting steps completed by patient: Adjust clothing prior to toileting, Performs perineal hygiene Toileting steps completed by helper: Adjust clothing after toileting Toileting Assistive Devices: Grab bar or rail Assist level: Touching or steadying assistance (Pt.75%)  Function - Air cabin crew transfer assistive device: Grab bar Assist level to toilet: Touching or steadying assistance (Pt > 75%) Assist level from toilet: Touching or steadying assistance (Pt > 75%)  Function - Chair/bed transfer Chair/bed transfer method: Stand pivot Chair/bed transfer assist level: Touching or steadying assistance (Pt > 75%) Chair/bed transfer assistive device: Armrests Chair/bed transfer details: Verbal cues for precautions/safety, Tactile cues for posture  Function - Locomotion: Wheelchair Will patient use wheelchair at discharge?:  (tbd) Type: Manual Wheelchair activity did not occur: Refused Max wheelchair distance: 150 Assist Level: Supervision or verbal cues Assist Level: Supervision or verbal cues Assist Level: Supervision or verbal cues Turns around,maneuvers to table,bed, and toilet,negotiates 3% grade,maneuvers on rugs and over doorsills: No Function - Locomotion: Ambulation Assistive device: No device Max distance: 87 Assist level: Moderate assist (Pt 50 - 74%) Assist level: Moderate assist (Pt 50 - 74%) Walk 50 feet with 2 turns activity did not occur: Refused Assist level: Moderate assist (Pt 50 - 74%) Walk 150 feet activity did not occur: Safety/medical concerns Walk 10 feet on uneven surfaces activity did not occur: Safety/medical concerns  Function - Comprehension Comprehension: Auditory Comprehension assist  level: Understands basic 75 - 89% of the time/ requires cueing 10 - 24% of the time  Function - Expression Expression: Verbal Expression assist level: Expresses basic 90% of the time/requires cueing < 10% of the time.  Function - Social Interaction Social Interaction assist level: Interacts appropriately with others with medication or extra time (anti-anxiety, antidepressant).  Function - Problem Solving Problem solving assist level: Solves basic 75 - 89% of the time/requires cueing 10 - 24% of the time  Function - Memory Memory assist level: Recognizes or recalls 25 - 49% of the time/requires cueing 50 - 75% of the time Patient normally able to recall (first 3 days only): Staff names and faces, That he or she is in a hospital, Current season  Medical Problem List and  Plan: 1. Cognitive, mobility, and functional deficits secondary to anoxic encephalopathy and right frontal infarct- team conf in am 2. DVT Prophylaxis/Anticoagulation: Pharmaceutical: Lovenox 3. Pain Management: tylenol prn 4. Mood: LCSW to follow for evaluation and support.  5. Neuropsych: This patient is not fully capable of making decisions on his own behalf. 6. Skin/Wound Care: Routine pressure relief measure.  7. Fluids/Electrolytes/Nutrition: Monitor I/O.  8. Anterior STEMI: to continue ASA, Brilinta, BB and statin. 9. Acute systolic heart failure: Has been compensated. Check daily weights and monitor for signs of overload. Continue aldactone, coreg and lisinopril.  . 10. 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 Neurology recommends coumadin plus plavix in one month.  11. MSSA PNA: IV ancef discontinued today--has completed 12 day antibiotic regimen. No sign of recurrence -pt is an aspiration risk--observe aspiration precautions 12. Acute on chronic renal failure: Diuresis discontinued with improvement. Continue to monitor as on modified diet without liquids. May need IVF at nights to maintain adequate  hydration.  13. Anemia: Slowly improving. Continue to monitor for signs of infection. Check CBC in am.  14. DM type 2: New diagnosis with Hgb A1C-6.9. BS uncontrolled due to tube feeds. Continue lantus daily and will transition to oral medication as intake improves. Monitor BS with ac/hs checks and use SSI for elevated BS.  15.  HypoK add KCL , f/u BMET in am 74.  Dysphagia, sensory def with silent asp will need pudding thick liq with f/u MBS perhaps this week LOS (Days) 4 A FACE TO FACE EVALUATION WAS PERFORMED  Rosella Crandell E 10/19/2015, 8:01 AM

## 2015-10-19 NOTE — Progress Notes (Signed)
Social Work  Social Work Assessment and Plan  Patient Details  Name: Guilio Twiggs MRN: [id number] Date of Birth: 1951/02/26  Today's Date: 10/19/2015  Problem List:  Patient Active Problem List   Diagnosis Date Noted  . Ischemic stroke of frontal lobe (HCC) 10/15/2015  . Anoxic encephalopathy (HCC) 10/15/2015  . Right-sided cerebrovascular accident (CVA) (HCC)   . Acute on chronic systolic congestive heart failure (HCC)   . HCAP (healthcare-associated pneumonia)   . Dysphagia as late effect of cerebrovascular disease   . Tobacco abuse   . Tachypnea   . Essential hypertension   . Type 2 diabetes mellitus with complication (HCC)   . Leukocytosis   . Absolute anemia   . Altered mental status   . Hemiplegia (HCC)   . Stroke (HCC)   . ARDS (adult respiratory distress syndrome) (HCC)   . Acute respiratory failure (HCC)   . STEMI (ST elevation myocardial infarction) (HCC) 09/28/2015  . Cardiac arrest (HCC)   . ST elevation myocardial infarction (STEMI) (HCC)   . Encounter for central line placement   . Hypokalemia   . Encephalopathy acute   . Acute hypoxemic respiratory failure [facility])    Past Medical History:  Past Medical History  Diagnosis Date  . PUD (peptic ulcer disease)   . CAD (coronary artery disease)     a. STEMI 09/2015 w/ DES to Prox LAD  . CVA (cerebral infarction)     a. 09/2015: acute small right frontal lobe infarct   Past Surgical History:  Past Surgical History  Procedure Laterality Date  . Repair of peptic ulcer    . Cardiac catheterization N/A 09/28/2015    Procedure: Left Heart Cath and Coronary Angiography;  Surgeon: Runell Gess, MD;  Location: [facility] INVASIVE CV LAB;  Service: Cardiovascular;  Laterality: N/A;  . Cardiac catheterization N/A 10/08/2015    Procedure: Left Heart Cath and Coronary Angiography;  Surgeon: Lyn Records, MD;  Location: [facility] INVASIVE CV LAB;  Service: Cardiovascular;  Laterality: N/A;   Social History:  reports that he has been  smoking.  He does not have any smokeless tobacco history on file. His alcohol and drug histories are not on file.  Family / Support Systems Marital Status: Married Patient Roles: Spouse, Parent, Other (Comment) (employee) Spouse/Significant Other: [name] [phone]  [phone] Children: Two daughter's in [place] Other Supports: Friends and co-workers Anticipated Caregiver: Wife Ability/Limitations of Caregiver: Wife works three days a week 10-9pm Sun-Tues Caregiver Availability: Other (Comment) (Wife not sure if can arrange 24 hr care-needs to work) Family Dynamics: Close with daughter's who are out of town, he and wife work together at [employer] He enjoys working and  serving others. Wife reports he wants to return to work and recover from this.  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 Wife will get paperwork from work for [employer] and if any other STD if has Legal Hisotry/Current Legal Issues: No issues Guardian/Conservator: None-according to MD pt is not capable of making his own decisions while here. Will look toward wife to make any decisions while here.   Abuse/Neglect Physical Abuse: Denies Verbal Abuse: Denies Sexual Abuse: Denies Exploitation of patient/patient's resources: Denies Self-Neglect: Denies  Emotional Status Pt's affect, behavior adn adjustment status: Pt is not able to  participate in assessment due to confusion. Received information from his wife. He is self sufficient and prides himself on being independent. He wants to recover from this and return to work if possible. He has been very healthy and never in a hospital before now. Recent Psychosocial Issues: had health isues but did not go to a MD, due to stubborness Pyschiatric  History: No history deferred depression screen due to pt's confusion and inability to participate. Will re-assess and see if would benefit from neuro-psych due to age and adjustment.  Substance Abuse History: Tobacco aware needs to quit for health benefits.  Patient / Family Perceptions, Expectations & Goals Pt/Family understanding of illness & functional limitations: Wife can explain his condition and deficits, she is hopeful he will improve and be able to be home alone while she is working. She has spoken with the MD and keeps updated regarding husband's condition when she is working. She is here when she is off. Premorbid pt/family roles/activities: Husband, father, grandfather, employee, church member, friend, etc Anticipated changes in roles/activities/participation: resume Pt/family expectations/goals: Pt states: " I want to get out of here."  Wife states: " It is overwhelming but want him to do well, so he can come home."  Community Resources Community [employer] Agencies: None Transportation available at discharge: Wife Resource referrals recommended: Neuropsychology, Support group (specify)  Discharge Planning Living Arrangements: Spouse/significant other Support Systems: Spouse/significant other, Children, Manufacturing engineer, Psychologist, clinical community Type of Residence: Private residence Community education officer Resources: Media planner (specify) Herbalist) Financial Resources: Employment, Garment/textile technologist Screen Referred: No Living Expenses: Own Money Management: Spouse, Patient Does the patient have any problems obtaining your medications?: No (Doesnt; go to a MD) Home Management: Wife does home management Patient/Family Preliminary Plans: Return home with wife who can provide assist-does work Sun-Tues 10-pm. Pt will need to get to mod/i to be able to return home. It is difficult to assess at this time if he will reach the goals to return home with wife  working. Social Work Anticipated Follow Up Needs: HH/OP, Support Group  Clinical Impression Confused gentleman who has always been independent and self sufficient. His wife is supportive and involved and willing to assist but needs to continue working since pt is not at this time. It is unlikely pt's insurance BCBS will also cover NHP since pt came to rehab, so need to see how much recovery pt will have and work on a safe discharge plan. Wife is here when not working. She will get any paper work needed to be completed by MD and bring it in. Work on a safe discharge plan.  Lucy Chris 10/19/2015, 8:52 AM

## 2015-10-19 NOTE — Progress Notes (Signed)
Speech Language Pathology Daily Session Note  Patient Details  Name: Eddie [name] MRN: [id number] Date of Birth: July 30, 1950  Today's Date: 10/19/2015 SLP Individual Time: 0803-0900 SLP Individual Time Calculation (min): 57 min  Short Term Goals: Week 1: SLP Short Term Goal 1 (Week 1): Pt will sustain attion to a basic familiary task for ~3 minute intervals with mod verbal cues for redirection.  SLP Short Term Goal 2 (Week 1): Pt will answer orientation questions with 90% and mod verbal cues. SLP Short Term Goal 3 (Week 1): Pt will demonstrate basic problem solving for familiar tasks with mod verbal cues.  SLP Short Term Goal 4 (Week 1): Pt will complete swallow strengthening exercises to demonstrate readiness for repeat objective swallow assessment.  SLP Short Term Goal 5 (Week 1): Pt will recall swallow deficits, current diet and compenastory strategies with 80% and mod verbal cues.  SLP Short Term Goal 6 (Week 1): Pt will use compensatory swallow strategies with 80% accuracy and mod verbal cues.   Skilled Therapeutic Interventions:  Pt was seen for skilled ST targeting goals for dysphagia and communication.  Pt eating breakfast upon arrival.  Initially pt was not using extra swallows; however, following discussion regarding current swallowing limitations pt verbalized and utilized swallowing precautions with min assist verbal cues.  No overt s/s of aspiration evident with purees or pudding thick liquids.  Pt oriented to place and situation with no assist; however, he needed mod assist verbal cues to use calendar to reorient to exact date.  SLP facilitated the session with a familiar task targeting sustained attention and basic functional problem solving.  Pt was able to sustain his attention to task for ~25 minutes with no cues needed for redirection.  Pt was also able to plan and execute a problem solving strategy with supervision cues during the abovementioned task.     Function:  Eating Eating   Modified Consistency Diet: Yes Eating Assist Level: Supervision or verbal cues           Cognition Comprehension Comprehension assist level: Understands basic 75 - 89% of the time/ requires cueing 10 - 24% of the time  Expression   Expression assist level: Expresses basic 90% of the time/requires cueing < 10% of the time.  Social Interaction Social Interaction assist level: Interacts appropriately with others with medication or extra time (anti-anxiety, antidepressant).  Problem Solving Problem solving assist level: Solves basic 50 - 74% of the time/requires cueing 25 - 49% of the time  Memory Memory assist level: Recognizes or recalls 25 - 49% of the time/requires cueing 50 - 75% of the time    Pain Pain Assessment Pain Assessment: No/denies pain Pain Score: 0-No pain  Therapy/Group: Individual Therapy  Emilina Smarr, Melanee Spry 10/19/2015, 12:38 PM

## 2015-10-19 NOTE — Progress Notes (Signed)
Physical Therapy Session Note  Patient Details  Name: Eddie [name] MRN: [id number] Date of Birth: 1951/06/23  Today's Date: 10/19/2015 PT Individual Time: 1000-1028 PT Individual Time Calculation (min): 28 min   Short Term Goals: Week 1:  PT Short Term Goal 1 (Week 1): Patient will be able to ambulate at least 50 ft with Mod A with LRAD PT Short Term Goal 2 (Week 1): Patient will demonstrate bed mobility with Supervision A  PT Short Term Goal 3 (Week 1): Patient will propell WC with min A for 17ft.   Skilled Therapeutic Interventions/Progress Updates:    Pt received in w/c & agreeable to PT, denying c/o pain. Pt ambulated 245 ft with RW & steady A, demonstrating scissoring gait and forward trunk flexion while leaning on RW. PT educated pt on increasing base of support, ambulating in base of RW & looking up instead of at floor, pt able to self correct but required cuing throughout gait. Pt negotiated 6 steps, 3 inches in height, with B railings & CGA. Pt with scissoring, but reciprocal pattern, when negotiating stairs. PT educated pt on importance of maintaining wider BOS to prevent pt from tripping over his own feet. Pt self-propelled w/c with BUE for 120 ft with supervision for cardiovascular endurance. At end of session pt left in w/c with QRB in place & all needs within reach.   Therapy Documentation Precautions:  Precautions Precautions: Fall Precaution Comments: impulsive, net bed Restrictions Weight Bearing Restrictions: No  Pain: Pain Assessment Pain Assessment: No/denies pain   See Function Navigator for Current Functional Status.   Therapy/Group: Individual Therapy  Sandi Mariscal 10/19/2015, 10:36 AM

## 2015-10-20 ENCOUNTER — Inpatient Hospital Stay (HOSPITAL_COMMUNITY): Payer: BLUE CROSS/BLUE SHIELD | Admitting: Speech Pathology

## 2015-10-20 ENCOUNTER — Inpatient Hospital Stay (HOSPITAL_COMMUNITY): Payer: BLUE CROSS/BLUE SHIELD | Admitting: Occupational Therapy

## 2015-10-20 ENCOUNTER — Inpatient Hospital Stay (HOSPITAL_COMMUNITY): Payer: BLUE CROSS/BLUE SHIELD | Admitting: Physical Therapy

## 2015-10-20 LAB — GLUCOSE, CAPILLARY
GLUCOSE-CAPILLARY: 115 mg/dL — AB (ref 65–99)
GLUCOSE-CAPILLARY: 173 mg/dL — AB (ref 65–99)
GLUCOSE-CAPILLARY: 228 mg/dL — AB (ref 65–99)
Glucose-Capillary: 230 mg/dL — ABNORMAL HIGH (ref 65–99)

## 2015-10-20 NOTE — Progress Notes (Signed)
Occupational Therapy Session Note  Patient Details  Name: Eddie [name] MRN: [id number] Date of Birth: 1951-06-19  Today's Date: 10/20/2015 OT Individual Time: 1430-1500 OT Individual Time Calculation (min): 30 min    Skilled Therapeutic Interventions/Progress Updates:    Pt ambulated down to the ADL kitchen and engaged in simple meal prep of making oatmeal on the stove.  He needed min instructional cueing for locating items in cabinets not labeled to get a pot and spoon.  He was able to explain to therapist the correct size of burner he needed to use and why not to use a larger burner than the pot.  This was done without therapist asking him.  He cooked the oatmeal including taking measures on his own to turn down the heat to keep it from boiling over and appropriately turning off the stove as well.  Pt needing min guard assist with mobility around the room.  Note he continues to need min assist for ambulation in the hallway without assistive device.  Increased staggering to both the left and right sides noted.  Pt returned to room actually running into the computer on wheels on the left side as well.  Pt's spouse present and able to notice this as well.  Discussed his need for 24 hour supervision at discharge and she is going to work on making arrangements for this.  Pt left in wheelchair with safety belt in place and call button in reach.  Therapy Documentation Precautions:  Precautions Precautions: Fall Precaution Comments: impulsive Restrictions Weight Bearing Restrictions: No  Pain: Pain Assessment Pain Assessment: No/denies pain ADL: See Function Navigator for Current Functional Status.   Therapy/Group: Individual Therapy  Yvanna Vidas OTR/L 10/20/2015, 4:06 PM

## 2015-10-20 NOTE — Progress Notes (Signed)
Physical Therapy Session Note  Patient Details  Name: Eddie [name] MRN: [id number] Date of Birth: 06/25/1951  Today's Date: 10/20/2015 PT Individual Time: 1302-1400 PT Individual Time Calculation (min): 58 min   Short Term Goals: Week 1:  PT Short Term Goal 1 (Week 1): Patient will be able to ambulate at least 50 ft with Mod A with LRAD PT Short Term Goal 2 (Week 1): Patient will demonstrate bed mobility with Supervision A  PT Short Term Goal 3 (Week 1): Patient will propell WC with min A for 38ft.   Skilled Therapeutic Interventions/Progress Updates:    Pt received in w/c & agreeable to PT, denying c/o pain. Pt ambulated 120 ft without AD & steady A, requiring intermittent Min A 2/2 loss of balance. Pt continues to demonstrate scissoring gait & unsteadiness. Pt able to negotiate 12 steps with B railings, 4 steps with single railing, and 4 steps without railings all with supervision A with PT cuing pt to increase base of support/step width; pt with reciprocal pattern while negotiating stairs. While standing on compliant surface pt reached outside of BOS in all directions with LUE to obtain & toss horseshoes; pt required CGA throughout task & Min A on one occasion 2/2 LOB & difficulty to correct. Pt also able to retrieve objects from floor with CGA. LLE coordination was addressed by having pt place LLE on specific numbered circles on floor while performing cognitive task simultaneously with CGA. Pt able to place LLE more purposefully in intended location as activity progressed. Pt performed step-ups on single step leading with LLE for ascending & RLE for descending backwards for 2 sets of 10 reps without BUE support to increase L quad strength. Pt able to complete furniture transfer with supervision, car transfer with CGA to ambulate to car & Supervision to transfer into car, and Mod I for bed mobility in apartment bed. Pt then ambulated from ortho gym>room ~200 ft with no AD. Pt with poor balance with  gait requiring Min A frequently to prevent loss of balance; balance worsens when pt is talking & not focusing on gait. PT cued pt for increase step width to help prevent scissoring; pt also with decreased L foot clearance. At end of session pt left in w/c with QRB in place & wife present.    Therapy Documentation Precautions:  Precautions Precautions: Fall Precaution Comments: impulsive Restrictions Weight Bearing Restrictions: No  Pain: Pain Assessment Pain Assessment: No/denies pain Pain Score: 0-No pain   See Function Navigator for Current Functional Status.   Therapy/Group: Individual Therapy  Sandi Mariscal 10/20/2015, 1:08 PM

## 2015-10-20 NOTE — Progress Notes (Signed)
Speech Language Pathology Daily Session Note  Patient Details  Name: Eddie [name] MRN: [id number] Date of Birth: [birth year]  Today's Date: 10/20/2015 SLP Individual Time: 0930-1030 SLP Individual Time Calculation (min): 60 min  Short Term Goals: Week 1: SLP Short Term Goal 1 (Week 1): Pt will sustain attion to a basic familiary task for ~3 minute intervals with mod verbal cues for redirection.  SLP Short Term Goal 2 (Week 1): Pt will answer orientation questions with 90% and mod verbal cues. SLP Short Term Goal 3 (Week 1): Pt will demonstrate basic problem solving for familiar tasks with mod verbal cues.  SLP Short Term Goal 4 (Week 1): Pt will complete swallow strengthening exercises to demonstrate readiness for repeat objective swallow assessment.  SLP Short Term Goal 5 (Week 1): Pt will recall swallow deficits, current diet and compenastory strategies with 80% and mod verbal cues.  SLP Short Term Goal 6 (Week 1): Pt will use compensatory swallow strategies with 80% accuracy and mod verbal cues.   Skilled Therapeutic Interventions:  Pt was seen for skilled ST targeting goals for dysphagia and cognition.  SLP facilitated the session with trials of ice chips following thorough oral care to continue working towards diet progression.  Pt demonstrated intermittent delayed, reflexive cough every 3-4 teaspoons of ice chips.  Vocal quality remained clear but hoarse both during and after trials.  Pt reports hoarseness at baseline due to history of smoking.  Pt has demonstrated overall improved cognitive and physical function since arriving to rehab.  Recommend repeat swallowing test at next available appointment to determine readiness to advance diet.  Pt was oriented to place, date, and situation with min question cues.  SLP facilitated the session with a basic money management task targeting functional problem solving and sustained attention to task.  Pt required mod assist for working memory to count  money.  He was able to sustain his attention to task for ~3-4 minutes with min verbal cues for redirection.  Mod-max assist verbal cues needed for organization when completing paper and pencil calculation to compensate for working memory deficits.  Pt was returned to room and left in chair with quick release belt donned and call bell within reach.  Continue per current plan of care.    Function:  Eating Eating   Modified Consistency Diet: Yes Eating Assist Level: Supervision or verbal cues           Cognition Comprehension Comprehension assist level: Understands basic 75 - 89% of the time/ requires cueing 10 - 24% of the time  Expression   Expression assist level: Expresses basic 90% of the time/requires cueing < 10% of the time.  Social Interaction Social Interaction assist level: Interacts appropriately with others with medication or extra time (anti-anxiety, antidepressant).  Problem Solving Problem solving assist level: Solves basic 50 - 74% of the time/requires cueing 25 - 49% of the time  Memory Memory assist level: Recognizes or recalls 50 - 74% of the time/requires cueing 25 - 49% of the time    Pain Pain Assessment Pain Assessment: No/denies pain  Therapy/Group: Individual Therapy  Randen Kauth, Melanee Spry 10/20/2015, 3:37 PM

## 2015-10-20 NOTE — Progress Notes (Signed)
Social Work Patient ID: Eddie [name], male   DOB: 08-Jun-1951, 65 y.o.   MRN: [id number] Met with pt and wife when she was here to discuss team conference goals supervision level and target discharge 4/5. Made wife aware she will need to come and learn his care prior to discharge. She reports she is on vacation for the next two weeks and can be here when needed. She is aware he needs to slow down to be safer. She hopes he will do very well here and not need supervision for long. Discussed if pt has a PCP he does not so will need to find him one at discharge. Both pleased with the goals and discharge date. Will work toward discharge next Wed.

## 2015-10-20 NOTE — Progress Notes (Signed)
Subjective/Complaints: Pt wants to go home, we discussed his medical complexity Per OT is minA/Sup for ADLs  ROS- no bowel or bladder issues reported, cold last noc  Objective: Vital Signs: Blood pressure 155/80, pulse 97, temperature 99 F (37.2 C), temperature source Oral, resp. rate 16, height '5\' 7"'$  (1.702 m), weight 67.45 kg (148 lb 11.2 oz), SpO2 100 %. No results found. Results for orders placed or performed during the hospital encounter of 10/15/15 (from the past 72 hour(s))  Glucose, capillary     Status: Abnormal   Collection Time: 10/17/15 12:56 PM  Result Value Ref Range   Glucose-Capillary 318 (H) 65 - 99 mg/dL  Renal function panel     Status: Abnormal   Collection Time: 10/17/15  3:38 PM  Result Value Ref Range   Sodium 141 135 - 145 mmol/L   Potassium 3.0 (L) 3.5 - 5.1 mmol/L   Chloride 105 101 - 111 mmol/L   CO2 25 22 - 32 mmol/L   Glucose, Bld 284 (H) 65 - 99 mg/dL   BUN 40 (H) 6 - 20 mg/dL   Creatinine, Ser 1.33 (H) 0.61 - 1.24 mg/dL   Calcium 8.5 (L) 8.9 - 10.3 mg/dL   Phosphorus 2.6 2.5 - 4.6 mg/dL   Albumin 2.7 (L) 3.5 - 5.0 g/dL   GFR calc non Af Amer 55 (L) >60 mL/min   GFR calc Af Amer >60 >60 mL/min    Comment: (NOTE) The eGFR has been calculated using the CKD EPI equation. This calculation has not been validated in all clinical situations. eGFR's persistently <60 mL/min signify possible Chronic Kidney Disease.    Anion gap 11 5 - 15  Glucose, capillary     Status: Abnormal   Collection Time: 10/17/15  4:33 PM  Result Value Ref Range   Glucose-Capillary 256 (H) 65 - 99 mg/dL  Glucose, capillary     Status: Abnormal   Collection Time: 10/17/15  9:41 PM  Result Value Ref Range   Glucose-Capillary 154 (H) 65 - 99 mg/dL  Glucose, capillary     Status: None   Collection Time: 10/18/15  7:21 AM  Result Value Ref Range   Glucose-Capillary 86 65 - 99 mg/dL  Renal function panel     Status: Abnormal   Collection Time: 10/18/15  9:32 AM  Result Value  Ref Range   Sodium 140 135 - 145 mmol/L   Potassium 2.7 (LL) 3.5 - 5.1 mmol/L    Comment: CRITICAL RESULT CALLED TO, READ BACK BY AND VERIFIED WITH: REARDON,W RN @ 1046 10/18/15 LEONARD,A    Chloride 102 101 - 111 mmol/L   CO2 28 22 - 32 mmol/L   Glucose, Bld 153 (H) 65 - 99 mg/dL   BUN 26 (H) 6 - 20 mg/dL   Creatinine, Ser 0.98 0.61 - 1.24 mg/dL   Calcium 8.2 (L) 8.9 - 10.3 mg/dL   Phosphorus 2.6 2.5 - 4.6 mg/dL   Albumin 2.6 (L) 3.5 - 5.0 g/dL   GFR calc non Af Amer >60 >60 mL/min   GFR calc Af Amer >60 >60 mL/min    Comment: (NOTE) The eGFR has been calculated using the CKD EPI equation. This calculation has not been validated in all clinical situations. eGFR's persistently <60 mL/min signify possible Chronic Kidney Disease.    Anion gap 10 5 - 15  Basic metabolic panel     Status: Abnormal   Collection Time: 10/18/15 11:09 AM  Result Value Ref Range   Sodium 139 135 -  145 mmol/L   Potassium 2.6 (LL) 3.5 - 5.1 mmol/L    Comment: CRITICAL RESULT CALLED TO, READ BACK BY AND VERIFIED WITH: W.REARDON,RN 10/18/15 1253 BY BSLADE    Chloride 103 101 - 111 mmol/L   CO2 25 22 - 32 mmol/L   Glucose, Bld 185 (H) 65 - 99 mg/dL   BUN 26 (H) 6 - 20 mg/dL   Creatinine, Ser 2.40 0.61 - 1.24 mg/dL   Calcium 8.1 (L) 8.9 - 10.3 mg/dL   GFR calc non Af Amer >60 >60 mL/min   GFR calc Af Amer >60 >60 mL/min    Comment: (NOTE) The eGFR has been calculated using the CKD EPI equation. This calculation has not been validated in all clinical situations. eGFR's persistently <60 mL/min signify possible Chronic Kidney Disease.    Anion gap 11 5 - 15  Glucose, capillary     Status: Abnormal   Collection Time: 10/18/15 11:29 AM  Result Value Ref Range   Glucose-Capillary 194 (H) 65 - 99 mg/dL   Comment 1 Notify RN   Glucose, capillary     Status: Abnormal   Collection Time: 10/18/15  4:10 PM  Result Value Ref Range   Glucose-Capillary 274 (H) 65 - 99 mg/dL   Comment 1 Notify RN   Basic  metabolic panel     Status: Abnormal   Collection Time: 10/18/15  5:36 PM  Result Value Ref Range   Sodium 136 135 - 145 mmol/L   Potassium 2.8 (L) 3.5 - 5.1 mmol/L   Chloride 100 (L) 101 - 111 mmol/L   CO2 25 22 - 32 mmol/L   Glucose, Bld 316 (H) 65 - 99 mg/dL   BUN 25 (H) 6 - 20 mg/dL   Creatinine, Ser 1.10 0.61 - 1.24 mg/dL   Calcium 8.1 (L) 8.9 - 10.3 mg/dL   GFR calc non Af Amer >60 >60 mL/min   GFR calc Af Amer >60 >60 mL/min    Comment: (NOTE) The eGFR has been calculated using the CKD EPI equation. This calculation has not been validated in all clinical situations. eGFR's persistently <60 mL/min signify possible Chronic Kidney Disease.    Anion gap 11 5 - 15  Glucose, capillary     Status: Abnormal   Collection Time: 10/18/15  8:35 PM  Result Value Ref Range   Glucose-Capillary 173 (H) 65 - 99 mg/dL  Comprehensive metabolic panel     Status: Abnormal   Collection Time: 10/18/15 10:43 PM  Result Value Ref Range   Sodium 137 135 - 145 mmol/L   Potassium 3.4 (L) 3.5 - 5.1 mmol/L    Comment: DELTA CHECK NOTED   Chloride 105 101 - 111 mmol/L   CO2 23 22 - 32 mmol/L   Glucose, Bld 244 (H) 65 - 99 mg/dL   BUN 24 (H) 6 - 20 mg/dL   Creatinine, Ser 4.65 0.61 - 1.24 mg/dL   Calcium 8.1 (L) 8.9 - 10.3 mg/dL   Total Protein 6.5 6.5 - 8.1 g/dL   Albumin 2.5 (L) 3.5 - 5.0 g/dL   AST 17 15 - 41 U/L   ALT 8 (L) 17 - 63 U/L   Alkaline Phosphatase 76 38 - 126 U/L   Total Bilirubin 0.4 0.3 - 1.2 mg/dL   GFR calc non Af Amer >60 >60 mL/min   GFR calc Af Amer >60 >60 mL/min    Comment: (NOTE) The eGFR has been calculated using the CKD EPI equation. This calculation has not been  validated in all clinical situations. eGFR's persistently <60 mL/min signify possible Chronic Kidney Disease.    Anion gap 9 5 - 15  Renal function panel     Status: Abnormal   Collection Time: 10/19/15  6:43 AM  Result Value Ref Range   Sodium 139 135 - 145 mmol/L   Potassium 3.3 (L) 3.5 - 5.1 mmol/L    Chloride 104 101 - 111 mmol/L   CO2 25 22 - 32 mmol/L   Glucose, Bld 135 (H) 65 - 99 mg/dL   BUN 21 (H) 6 - 20 mg/dL   Creatinine, Ser 0.96 0.61 - 1.24 mg/dL   Calcium 8.4 (L) 8.9 - 10.3 mg/dL   Phosphorus 2.6 2.5 - 4.6 mg/dL   Albumin 2.6 (L) 3.5 - 5.0 g/dL   GFR calc non Af Amer >60 >60 mL/min   GFR calc Af Amer >60 >60 mL/min    Comment: (NOTE) The eGFR has been calculated using the CKD EPI equation. This calculation has not been validated in all clinical situations. eGFR's persistently <60 mL/min signify possible Chronic Kidney Disease.    Anion gap 10 5 - 15  Glucose, capillary     Status: Abnormal   Collection Time: 10/19/15  6:57 AM  Result Value Ref Range   Glucose-Capillary 130 (H) 65 - 99 mg/dL  Glucose, capillary     Status: Abnormal   Collection Time: 10/19/15 11:44 AM  Result Value Ref Range   Glucose-Capillary 197 (H) 65 - 99 mg/dL  Glucose, capillary     Status: Abnormal   Collection Time: 10/19/15  4:45 PM  Result Value Ref Range   Glucose-Capillary 221 (H) 65 - 99 mg/dL  Glucose, capillary     Status: Abnormal   Collection Time: 10/19/15  8:55 PM  Result Value Ref Range   Glucose-Capillary 114 (H) 65 - 99 mg/dL      General: No acute distress Mood and affect are appropriate Heart: Regular rate and rhythm no rubs murmurs or extra sounds Lungs: Clear to auscultation, breathing unlabored, no rales or wheezes Abdomen: Positive bowel sounds, soft nontender to palpation, nondistended Extremities: No clubbing, cyanosis, or edema Skin: No evidence of breakdown, no evidence of rash Neurologic: Cranial nerves II through XII intact, motor strength is 5/5 in bilateral deltoid, bicep, tricep, grip, hip flexor, knee extensors, ankle dorsiflexor and plantar flexor Sensory exam normal sensation to light touch and proprioception in bilateral upper and lower extremities  Cerebellar exam mild dysmetria RUE Musculoskeletal: Full range of motion in all 4 extremities. No  joint swelling   Assessment/Plan: 1. Functional deficits secondary to Cognitive, mobility, and functional deficits secondary to anoxic encephalopathy and right frontal infarct which require 3+ hours per day of interdisciplinary therapy in a comprehensive inpatient rehab setting. Physiatrist is providing close team supervision and 24 hour management of active medical problems listed below. Physiatrist and rehab team continue to assess barriers to discharge/monitor patient progress toward functional and medical goals. FIM: Function - Bathing Position: Shower Body parts bathed by patient: Right arm, Left arm, Chest, Abdomen, Front perineal area, Buttocks, Right upper leg, Left upper leg, Right lower leg, Left lower leg Body parts bathed by helper: Back Assist Level: Touching or steadying assistance(Pt > 75%)  Function- Upper Body Dressing/Undressing What is the patient wearing?: Pull over shirt/dress Pull over shirt/dress - Perfomed by patient: Thread/unthread right sleeve, Thread/unthread left sleeve, Put head through opening, Pull shirt over trunk Assist Level: Supervision or verbal cues Function - Lower Body Dressing/Undressing What  is the patient wearing?: Underwear, Pants, Socks, Shoes Position: Education officer, museum at [employer] - Performed by patient: Thread/unthread right underwear leg, Thread/unthread left underwear leg, Pull underwear up/down Pants- Performed by patient: Pull pants up/down, Fasten/unfasten pants Non-skid slipper socks- Performed by helper: Don/doff right sock, Don/doff left sock Socks - Performed by patient: Don/doff right sock, Don/doff left sock Shoes - Performed by patient: Don/doff left shoe, Don/doff right shoe, Fasten left (no laces for right shoe) Assist for footwear: Dependant Assist for lower body dressing: Touching or steadying assistance (Pt > 75%)  Function - Toileting Toileting activity did not occur:  (did not need to complete toileting task with  OT) Toileting steps completed by patient: Adjust clothing prior to toileting, Performs perineal hygiene Toileting steps completed by helper: Adjust clothing after toileting Toileting Assistive Devices: Grab bar or rail Assist level: Touching or steadying assistance (Pt.75%)  Function - Air cabin crew transfer assistive device: Grab bar Assist level to toilet: Touching or steadying assistance (Pt > 75%) Assist level from toilet: Touching or steadying assistance (Pt > 75%)  Function - Chair/bed transfer Chair/bed transfer method: Stand pivot Chair/bed transfer assist level: Touching or steadying assistance (Pt > 75%) Chair/bed transfer assistive device: Armrests Chair/bed transfer details: Verbal cues for precautions/safety, Tactile cues for posture  Function - Locomotion: Wheelchair Will patient use wheelchair at discharge?:  (tbd) Type: Manual Wheelchair activity did not occur: Refused Max wheelchair distance: 120 Assist Level: Supervision or verbal cues Assist Level: Supervision or verbal cues Assist Level: Supervision or verbal cues Turns around,maneuvers to table,bed, and toilet,negotiates 3% grade,maneuvers on rugs and over doorsills: No Function - Locomotion: Ambulation Assistive device: Walker-rolling Max distance: 245 Assist level: Touching or steadying assistance (Pt > 75%) Assist level: Touching or steadying assistance (Pt > 75%) Walk 50 feet with 2 turns activity did not occur: Refused Assist level: Touching or steadying assistance (Pt > 75%) Walk 150 feet activity did not occur: Safety/medical concerns Assist level: Touching or steadying assistance (Pt > 75%) Walk 10 feet on uneven surfaces activity did not occur: Safety/medical concerns  Function - Comprehension Comprehension: Auditory Comprehension assist level: Understands basic 75 - 89% of the time/ requires cueing 10 - 24% of the time  Function - Expression Expression: Verbal Expression assist  level: Expresses basic 90% of the time/requires cueing < 10% of the time.  Function - Social Interaction Social Interaction assist level: Interacts appropriately with others with medication or extra time (anti-anxiety, antidepressant).  Function - Problem Solving Problem solving assist level: Solves basic 50 - 74% of the time/requires cueing 25 - 49% of the time  Function - Memory Memory assist level: Recognizes or recalls 25 - 49% of the time/requires cueing 50 - 75% of the time Patient normally able to recall (first 3 days only): Staff names and faces, That he or she is in a hospital, Current season  Medical Problem List and Plan: 1. Cognitive, mobility, and functional deficits secondary to anoxic encephalopathy and right frontal infarct- Team conference today please see physician documentation under team conference tab, met with team face-to-face to discuss problems,progress, and goals. Formulized individual treatment plan based on medical history, underlying problem and comorbidities.  Pt wants to go home 2. DVT Prophylaxis/Anticoagulation: Pharmaceutical: Lovenox 3. Pain Management: tylenol prn 4. Mood: LCSW to follow for evaluation and support. Less impulsive will d/c enclosure bed 5. Neuropsych: This patient is not fully capable of making decisions on his own behalf. 6. Skin/Wound Care: Routine pressure relief measure.  7. Fluids/Electrolytes/Nutrition:  Monitor I/O.  8. Anterior STEMI: to continue ASA, Brilinta, BB and statin. 9. Acute systolic heart failure: Has been compensated. Check daily weights and monitor for signs of overload. Continue aldactone, coreg and lisinopril.  . 10. 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 Neurology recommends coumadin plus plavix in one month.  11. MSSA PNA: IV ancef discontinued today--has completed 12 day antibiotic regimen. No sign of recurrence -pt is an aspiration risk--observe aspiration precautions 12. Acute on chronic renal failure:  Diuresis discontinued with improvement. Continue to monitor as on modified diet without liquids. May need IVF at nights to maintain adequate hydration.  13. Anemia: Slowly improving. Continue to monitor for signs of infection. Check CBC in am.  14. DM type 2: New diagnosis with Hgb A1C-6.9. BS uncontrolled due to tube feeds. Continue lantus daily and will transition to oral medication as intake improves. Monitor BS with ac/hs checks and use SSI for elevated BS. PM CBG elevated, add am metformin 15.  HypoK add KCL , f/u BMET today 16.  Dysphagia, sensory def with silent asp will need pudding thick liq with f/u MBS perhaps this week LOS (Days) 5 A FACE TO FACE EVALUATION WAS PERFORMED  Eddie [name] E 10/20/2015, 8:12 AM

## 2015-10-20 NOTE — Patient Care Conference (Signed)
Inpatient RehabilitationTeam Conference and Plan of Care Update Date: 10/20/2015   Time: 10:30 Am    Patient Name: Eddie [name]      Medical Record Number: [id number]  Date of Birth: July 22, 1951 Sex: Male         Room/Bed: 4W18C/4W18C-01 Payor Info: Payor: BLUE CROSS BLUE SHIELD / Plan: BCBS OTHER / Product Type: *No Product type* /    Admitting Diagnosis: L FRONTAL INFACT  Admit Date/Time:  10/15/2015  6:51 PM Admission Comments: No comment available   Primary Diagnosis:  Ischemic stroke of frontal lobe (HCC) Principal Problem: Ischemic stroke of frontal lobe [facility])  Patient Active Problem List   Diagnosis Date Noted  . Ischemic stroke of frontal lobe (HCC) 10/15/2015  . Anoxic encephalopathy (HCC) 10/15/2015  . Right-sided cerebrovascular accident (CVA) (HCC)   . Acute on chronic systolic congestive heart failure (HCC)   . HCAP (healthcare-associated pneumonia)   . Dysphagia as late effect of cerebrovascular disease   . Tobacco abuse   . Tachypnea   . Essential hypertension   . Type 2 diabetes mellitus with complication (HCC)   . Leukocytosis   . Absolute anemia   . Altered mental status   . Hemiplegia (HCC)   . Stroke (HCC)   . ARDS (adult respiratory distress syndrome) (HCC)   . Acute respiratory failure (HCC)   . STEMI (ST elevation myocardial infarction) (HCC) 09/28/2015  . Cardiac arrest (HCC)   . ST elevation myocardial infarction (STEMI) (HCC)   . Encounter for central line placement   . Hypokalemia   . Encephalopathy acute   . Acute hypoxemic respiratory failure [facility])     Expected Discharge Date: Expected Discharge Date: 10/27/15  Team Members Present: Physician leading conference: Dr. Claudette Laws Social Worker Present: Dossie Der, LCSW Nurse Present: Chana Bode, RN PT Present: Other (comment) Reggy Eye) OT Present: Perrin Maltese, OT SLP Present: Jackalyn Lombard, SLP PPS Coordinator present : Tora Duck, RN, CRRN     Current Status/Progress  Goal Weekly Team Focus  Medical   cognition and safety, Sup/minA ADL, poor balance  improve swallow, improve safety awareness, improve e lyte disturbance  D/C IV, correct elytes   Bowel/Bladder   Continent of bowel and bladder; LBM 3/28  Mod I  Assess and treat for constipation as needed   Swallow/Nutrition/ Hydration   dys 1, pudding thick liquids; full supervision   mod assist   repeat objective swallow study   ADL's   supervision for UB selfcare with min assist for transfers and LB selfcare in standing.  Decreased emergent or anticipatory awareness.    supervision to modified independent level  selfcare re-training, transfer training,  balance re-training, pt/family education, neuromuscular re-education   Mobility   mod A gait, supervision w/c  supervision gait, mod I w/c  safety awareness, gait training, balance   Communication             Safety/Cognition/ Behavioral Observations  moderate deficits, mod-max assist for basic familiar tasks due to decreased sustained attention, working memory impairment, decreased recall of daily information,   mod assist   basic cognition    Pain   C/o occasional headache- relieved by tylenol  < 3  Assess and treat for pain q shift and prn   Skin   Abrasion to forehead- OTA  Mod I assist  Assess skin q shift and prn      *See Care Plan and progress notes for long and short-term goals.  Barriers to Discharge: reduced safety  awareness, unclear whether, Low K, pudding liquids    Possible Resolutions to Barriers:  repeat MBS, Repeat     Discharge Planning/Teaching Needs:  Home with wife who does work three days a week-Sun-Tues, trying to find someone to be with while she works      Team Discussion:  Goals supervision level due to impulsive and poor awareness of deficits. Making progress in therapies, much less agaitated in therapies. DC vail bed today. MBS tomorrow to see if can upgrade diet. Visual issues-being addressed. Will need  supervision for a while for safety. Try to do family education with wife this week and next week  Revisions to Treatment Plan:  None   Continued Need for Acute Rehabilitation Level of Care: The patient requires daily medical management by a physician with specialized training in physical medicine and rehabilitation for the following conditions: Daily direction of a multidisciplinary physical rehabilitation program to ensure safe treatment while eliciting the highest outcome that is of practical value to the patient.: Yes Daily medical management of patient stability for increased activity during participation in an intensive rehabilitation regime.: Yes Daily analysis of laboratory values and/or radiology reports with any subsequent need for medication adjustment of medical intervention for : Neurological problems  Lavette Yankovich, Lemar Livings 10/20/2015, 1:17 PM

## 2015-10-20 NOTE — Progress Notes (Signed)
Occupational Therapy Session Note  Patient Details  Name: Eddie [name] MRN: [id number] Date of Birth: 12-14-1950  Today's Date: 10/20/2015 OT Individual Time: 0800-0900 OT Individual Time Calculation (min): 60 min    Short Term Goals: Week 1:  OT Short Term Goal 1 (Week 1): Pt will demonstrate improved dynamic standing balance by engaging in functional activity in standing for 5 minutes with min A for balance OT Short Term Goal 2 (Week 1): Pt will complete toilet task with supervision OT Short Term Goal 3 (Week 1): Pt will complete LB dressing with min A OT Short Term Goal 4 (Week 1): Pt will complete toilet transfer with supervision  Skilled Therapeutic Interventions/Progress Updates:    Pt pleasant and cooperative but reported being cold and wanting to go home because he can't get warm.  Initially he denied having any balance deficits but throughout session he needed min assist level for mobility with supervision for donning gripper socks and completing washing his face and hands.  He did not want to wash any further secondary to being cold.  Pt continued to perseverate on going home throughout session.  Had him ambulate to the [facility] gym for work on [employer].  Pt with history of left eye blindness but now also with right visual field deficit.  He continually missed 30-40% of lights on the right superior and inferior areas when they were on for 3 seconds.  Ninety percent accuracy on the left side as well.  Ambulated to the gym for next part of session and worked on static and dynamic standing balance.  He was able to stand with feet together as well as with eyes closed with close supervision.  Close supervision also needed for 360 degree turns.  Had pt stand on foam surface as well with LOB posteriorly in standing.  Pt finally acknowledging balance deficits demonstrating emergent awareness but unable to relate them to current CVA.  Pt returned to the room and left in wheelchair with safety belt in  place and call button in reach.    Therapy Documentation Precautions:  Precautions Precautions: Fall Precaution Comments: impulsive Restrictions Weight Bearing Restrictions: No  Pain: Pain Assessment Pain Assessment: No/denies pain Pain Score: 0-No pain ADL: See Function Navigator for Current Functional Status.   Therapy/Group: Individual Therapy  Jaice Digioia OTR/L 10/20/2015, 12:24 PM

## 2015-10-21 ENCOUNTER — Inpatient Hospital Stay (HOSPITAL_COMMUNITY): Payer: BLUE CROSS/BLUE SHIELD | Admitting: Speech Pathology

## 2015-10-21 ENCOUNTER — Encounter (HOSPITAL_COMMUNITY): Payer: BLUE CROSS/BLUE SHIELD | Admitting: Speech Pathology

## 2015-10-21 ENCOUNTER — Inpatient Hospital Stay (HOSPITAL_COMMUNITY): Payer: BLUE CROSS/BLUE SHIELD | Admitting: Occupational Therapy

## 2015-10-21 ENCOUNTER — Inpatient Hospital Stay (HOSPITAL_COMMUNITY): Payer: BLUE CROSS/BLUE SHIELD

## 2015-10-21 ENCOUNTER — Inpatient Hospital Stay (HOSPITAL_COMMUNITY): Payer: BLUE CROSS/BLUE SHIELD | Admitting: Physical Therapy

## 2015-10-21 LAB — RENAL FUNCTION PANEL
ALBUMIN: 2.5 g/dL — AB (ref 3.5–5.0)
Anion gap: 10 (ref 5–15)
BUN: 14 mg/dL (ref 6–20)
CHLORIDE: 107 mmol/L (ref 101–111)
CO2: 22 mmol/L (ref 22–32)
CREATININE: 1.03 mg/dL (ref 0.61–1.24)
Calcium: 8.3 mg/dL — ABNORMAL LOW (ref 8.9–10.3)
Glucose, Bld: 121 mg/dL — ABNORMAL HIGH (ref 65–99)
PHOSPHORUS: 2.6 mg/dL (ref 2.5–4.6)
Potassium: 4.8 mmol/L (ref 3.5–5.1)
Sodium: 139 mmol/L (ref 135–145)

## 2015-10-21 LAB — GLUCOSE, CAPILLARY
GLUCOSE-CAPILLARY: 106 mg/dL — AB (ref 65–99)
GLUCOSE-CAPILLARY: 241 mg/dL — AB (ref 65–99)
GLUCOSE-CAPILLARY: 165 mg/dL — AB (ref 65–99)
Glucose-Capillary: 150 mg/dL — ABNORMAL HIGH (ref 65–99)

## 2015-10-21 NOTE — Progress Notes (Signed)
Subjective/Complaints: Complaining of feeling rushed with meals, doesn't like being supervised Slept better out of net bed  ROS- no bowel or bladder issues reported, cold last noc  Objective: Vital Signs: Blood pressure 156/86, pulse 78, temperature 98.8 F (37.1 C), temperature source Oral, resp. rate 18, height '5\' 7"'$  (1.702 m), weight 65.6 kg (144 lb 10 oz), SpO2 100 %. No results found. Results for orders placed or performed during the hospital encounter of 10/15/15 (from the past 72 hour(s))  Renal function panel     Status: Abnormal   Collection Time: 10/18/15  9:32 AM  Result Value Ref Range   Sodium 140 135 - 145 mmol/L   Potassium 2.7 (LL) 3.5 - 5.1 mmol/L    Comment: CRITICAL RESULT CALLED TO, READ BACK BY AND VERIFIED WITH: REARDON,W RN @ 1046 10/18/15 LEONARD,A    Chloride 102 101 - 111 mmol/L   CO2 28 22 - 32 mmol/L   Glucose, Bld 153 (H) 65 - 99 mg/dL   BUN 26 (H) 6 - 20 mg/dL   Creatinine, Ser 0.98 0.61 - 1.24 mg/dL   Calcium 8.2 (L) 8.9 - 10.3 mg/dL   Phosphorus 2.6 2.5 - 4.6 mg/dL   Albumin 2.6 (L) 3.5 - 5.0 g/dL   GFR calc non Af Amer >60 >60 mL/min   GFR calc Af Amer >60 >60 mL/min    Comment: (NOTE) The eGFR has been calculated using the CKD EPI equation. This calculation has not been validated in all clinical situations. eGFR's persistently <60 mL/min signify possible Chronic Kidney Disease.    Anion gap 10 5 - 15  Basic metabolic panel     Status: Abnormal   Collection Time: 10/18/15 11:09 AM  Result Value Ref Range   Sodium 139 135 - 145 mmol/L   Potassium 2.6 (LL) 3.5 - 5.1 mmol/L    Comment: CRITICAL RESULT CALLED TO, READ BACK BY AND VERIFIED WITH: W.REARDON,RN 10/18/15 1253 BY BSLADE    Chloride 103 101 - 111 mmol/L   CO2 25 22 - 32 mmol/L   Glucose, Bld 185 (H) 65 - 99 mg/dL   BUN 26 (H) 6 - 20 mg/dL   Creatinine, Ser 1.04 0.61 - 1.24 mg/dL   Calcium 8.1 (L) 8.9 - 10.3 mg/dL   GFR calc non Af Amer >60 >60 mL/min   GFR calc Af Amer >60 >60  mL/min    Comment: (NOTE) The eGFR has been calculated using the CKD EPI equation. This calculation has not been validated in all clinical situations. eGFR's persistently <60 mL/min signify possible Chronic Kidney Disease.    Anion gap 11 5 - 15  Glucose, capillary     Status: Abnormal   Collection Time: 10/18/15 11:29 AM  Result Value Ref Range   Glucose-Capillary 194 (H) 65 - 99 mg/dL   Comment 1 Notify RN   Glucose, capillary     Status: Abnormal   Collection Time: 10/18/15  4:10 PM  Result Value Ref Range   Glucose-Capillary 274 (H) 65 - 99 mg/dL   Comment 1 Notify RN   Basic metabolic panel     Status: Abnormal   Collection Time: 10/18/15  5:36 PM  Result Value Ref Range   Sodium 136 135 - 145 mmol/L   Potassium 2.8 (L) 3.5 - 5.1 mmol/L   Chloride 100 (L) 101 - 111 mmol/L   CO2 25 22 - 32 mmol/L   Glucose, Bld 316 (H) 65 - 99 mg/dL   BUN 25 (H) 6 -  20 mg/dL   Creatinine, Ser 0.92 0.61 - 1.24 mg/dL   Calcium 8.1 (L) 8.9 - 10.3 mg/dL   GFR calc non Af Amer >60 >60 mL/min   GFR calc Af Amer >60 >60 mL/min    Comment: (NOTE) The eGFR has been calculated using the CKD EPI equation. This calculation has not been validated in all clinical situations. eGFR's persistently <60 mL/min signify possible Chronic Kidney Disease.    Anion gap 11 5 - 15  Glucose, capillary     Status: Abnormal   Collection Time: 10/18/15  8:35 PM  Result Value Ref Range   Glucose-Capillary 173 (H) 65 - 99 mg/dL  Comprehensive metabolic panel     Status: Abnormal   Collection Time: 10/18/15 10:43 PM  Result Value Ref Range   Sodium 137 135 - 145 mmol/L   Potassium 3.4 (L) 3.5 - 5.1 mmol/L    Comment: DELTA CHECK NOTED   Chloride 105 101 - 111 mmol/L   CO2 23 22 - 32 mmol/L   Glucose, Bld 244 (H) 65 - 99 mg/dL   BUN 24 (H) 6 - 20 mg/dL   Creatinine, Ser 1.06 0.61 - 1.24 mg/dL   Calcium 8.1 (L) 8.9 - 10.3 mg/dL   Total Protein 6.5 6.5 - 8.1 g/dL   Albumin 2.5 (L) 3.5 - 5.0 g/dL   AST 17 15 -  41 U/L   ALT 8 (L) 17 - 63 U/L   Alkaline Phosphatase 76 38 - 126 U/L   Total Bilirubin 0.4 0.3 - 1.2 mg/dL   GFR calc non Af Amer >60 >60 mL/min   GFR calc Af Amer >60 >60 mL/min    Comment: (NOTE) The eGFR has been calculated using the CKD EPI equation. This calculation has not been validated in all clinical situations. eGFR's persistently <60 mL/min signify possible Chronic Kidney Disease.    Anion gap 9 5 - 15  Renal function panel     Status: Abnormal   Collection Time: 10/19/15  6:43 AM  Result Value Ref Range   Sodium 139 135 - 145 mmol/L   Potassium 3.3 (L) 3.5 - 5.1 mmol/L   Chloride 104 101 - 111 mmol/L   CO2 25 22 - 32 mmol/L   Glucose, Bld 135 (H) 65 - 99 mg/dL   BUN 21 (H) 6 - 20 mg/dL   Creatinine, Ser 0.96 0.61 - 1.24 mg/dL   Calcium 8.4 (L) 8.9 - 10.3 mg/dL   Phosphorus 2.6 2.5 - 4.6 mg/dL   Albumin 2.6 (L) 3.5 - 5.0 g/dL   GFR calc non Af Amer >60 >60 mL/min   GFR calc Af Amer >60 >60 mL/min    Comment: (NOTE) The eGFR has been calculated using the CKD EPI equation. This calculation has not been validated in all clinical situations. eGFR's persistently <60 mL/min signify possible Chronic Kidney Disease.    Anion gap 10 5 - 15  Glucose, capillary     Status: Abnormal   Collection Time: 10/19/15  6:57 AM  Result Value Ref Range   Glucose-Capillary 130 (H) 65 - 99 mg/dL  Glucose, capillary     Status: Abnormal   Collection Time: 10/19/15 11:44 AM  Result Value Ref Range   Glucose-Capillary 197 (H) 65 - 99 mg/dL  Glucose, capillary     Status: Abnormal   Collection Time: 10/19/15  4:45 PM  Result Value Ref Range   Glucose-Capillary 221 (H) 65 - 99 mg/dL  Glucose, capillary     Status:  Abnormal   Collection Time: 10/19/15  8:55 PM  Result Value Ref Range   Glucose-Capillary 114 (H) 65 - 99 mg/dL  Glucose, capillary     Status: Abnormal   Collection Time: 10/20/15  6:38 AM  Result Value Ref Range   Glucose-Capillary 115 (H) 65 - 99 mg/dL  Glucose,  capillary     Status: Abnormal   Collection Time: 10/20/15 11:19 AM  Result Value Ref Range   Glucose-Capillary 230 (H) 65 - 99 mg/dL   Comment 1 Notify RN   Glucose, capillary     Status: Abnormal   Collection Time: 10/20/15  4:42 PM  Result Value Ref Range   Glucose-Capillary 173 (H) 65 - 99 mg/dL   Comment 1 Notify RN   Glucose, capillary     Status: Abnormal   Collection Time: 10/20/15  9:11 PM  Result Value Ref Range   Glucose-Capillary 228 (H) 65 - 99 mg/dL   Comment 1 Notify RN   Renal function panel     Status: Abnormal   Collection Time: 10/21/15  4:33 AM  Result Value Ref Range   Sodium 139 135 - 145 mmol/L   Potassium 4.8 3.5 - 5.1 mmol/L    Comment: DELTA CHECK NOTED   Chloride 107 101 - 111 mmol/L   CO2 22 22 - 32 mmol/L   Glucose, Bld 121 (H) 65 - 99 mg/dL   BUN 14 6 - 20 mg/dL   Creatinine, Ser 9.32 0.61 - 1.24 mg/dL   Calcium 8.3 (L) 8.9 - 10.3 mg/dL   Phosphorus 2.6 2.5 - 4.6 mg/dL   Albumin 2.5 (L) 3.5 - 5.0 g/dL   GFR calc non Af Amer >60 >60 mL/min   GFR calc Af Amer >60 >60 mL/min    Comment: (NOTE) The eGFR has been calculated using the CKD EPI equation. This calculation has not been validated in all clinical situations. eGFR's persistently <60 mL/min signify possible Chronic Kidney Disease.    Anion gap 10 5 - 15  Glucose, capillary     Status: Abnormal   Collection Time: 10/21/15  6:42 AM  Result Value Ref Range   Glucose-Capillary 106 (H) 65 - 99 mg/dL   Comment 1 Notify RN       General: No acute distress Mood and affect are appropriate Heart: Regular rate and rhythm no rubs murmurs or extra sounds Lungs: Clear to auscultation, breathing unlabored, no rales or wheezes Abdomen: Positive bowel sounds, soft nontender to palpation, nondistended Extremities: No clubbing, cyanosis, or edema Skin: No evidence of breakdown, no evidence of rash Neurologic: Cranial nerves II through XII intact, motor strength is 5/5 in bilateral deltoid, bicep,  tricep, grip, hip flexor, knee extensors, ankle dorsiflexor and plantar flexor Sensory exam normal sensation to light touch and proprioception in bilateral upper and lower extremities  Cerebellar exam mild dysmetria RUE Musculoskeletal: Full range of motion in all 4 extremities. No joint swelling   Assessment/Plan: 1. Functional deficits secondary to Cognitive, mobility, and functional deficits secondary to anoxic encephalopathy and right frontal infarct which require 3+ hours per day of interdisciplinary therapy in a comprehensive inpatient rehab setting. Physiatrist is providing close team supervision and 24 hour management of active medical problems listed below. Physiatrist and rehab team continue to assess barriers to discharge/monitor patient progress toward functional and medical goals. FIM: Function - Bathing Position: Shower Body parts bathed by patient: Right arm, Left arm, Chest, Abdomen, Front perineal area, Buttocks, Right upper leg, Left upper leg, Right lower  leg, Left lower leg Body parts bathed by helper: Back Assist Level: Touching or steadying assistance(Pt > 75%)  Function- Upper Body Dressing/Undressing What is the patient wearing?: Pull over shirt/dress Pull over shirt/dress - Perfomed by patient: Thread/unthread right sleeve, Thread/unthread left sleeve, Put head through opening, Pull shirt over trunk Assist Level: Supervision or verbal cues Function - Lower Body Dressing/Undressing What is the patient wearing?: Underwear, Pants, Socks, Shoes Position: Wheelchair/chair at [employer] - Performed by patient: Thread/unthread right underwear leg, Thread/unthread left underwear leg, Pull underwear up/down Pants- Performed by patient: Pull pants up/down, Fasten/unfasten pants Non-skid slipper socks- Performed by helper: Don/doff right sock, Don/doff left sock Socks - Performed by patient: Don/doff right sock, Don/doff left sock Shoes - Performed by patient: Don/doff  left shoe, Don/doff right shoe, Fasten left (no laces for right shoe) Assist for footwear: Dependant Assist for lower body dressing: Touching or steadying assistance (Pt > 75%)  Function - Toileting Toileting activity did not occur:  (did not need to complete toileting task with OT) Toileting steps completed by patient: Adjust clothing prior to toileting, Performs perineal hygiene, Adjust clothing after toileting Toileting steps completed by helper: Adjust clothing after toileting Toileting Assistive Devices: Grab bar or rail Assist level: Touching or steadying assistance (Pt.75%)  Function - Air cabin crew transfer assistive device: Grab bar Assist level to toilet: Touching or steadying assistance (Pt > 75%) Assist level from toilet: Touching or steadying assistance (Pt > 75%)  Function - Chair/bed transfer Chair/bed transfer method: Stand pivot Chair/bed transfer assist level: Touching or steadying assistance (Pt > 75%) Chair/bed transfer assistive device: Armrests Chair/bed transfer details: Verbal cues for precautions/safety, Tactile cues for posture  Function - Locomotion: Wheelchair Will patient use wheelchair at discharge?:  (tbd) Type: Manual Wheelchair activity did not occur: Refused Max wheelchair distance: 120 Assist Level: Supervision or verbal cues Assist Level: Supervision or verbal cues Assist Level: Supervision or verbal cues Turns around,maneuvers to table,bed, and toilet,negotiates 3% grade,maneuvers on rugs and over doorsills: No Function - Locomotion: Ambulation Assistive device: No device Max distance: 200 ft Assist level: Touching or steadying assistance (Pt > 75%) Assist level: Touching or steadying assistance (Pt > 75%) Walk 50 feet with 2 turns activity did not occur: Refused Assist level: Touching or steadying assistance (Pt > 75%) Walk 150 feet activity did not occur: Safety/medical concerns Assist level: Touching or steadying assistance (Pt  > 75%) Walk 10 feet on uneven surfaces activity did not occur: Safety/medical concerns  Function - Comprehension Comprehension: Auditory Comprehension assist level: Understands basic 75 - 89% of the time/ requires cueing 10 - 24% of the time  Function - Expression Expression: Verbal Expression assist level: Expresses basic 90% of the time/requires cueing < 10% of the time.  Function - Social Interaction Social Interaction assist level: Interacts appropriately with others with medication or extra time (anti-anxiety, antidepressant).  Function - Problem Solving Problem solving assist level: Solves basic 75 - 89% of the time/requires cueing 10 - 24% of the time  Function - Memory Memory assist level: Recognizes or recalls 75 - 89% of the time/requires cueing 10 - 24% of the time Patient normally able to recall (first 3 days only): Staff names and faces, That he or she is in a hospital, Current season  Medical Problem List and Plan: 1. Cognitive, mobility, and functional deficits secondary to anoxic encephalopathy and right frontal infarct- discussed D/C date 4/5, pt in agreement 2. DVT Prophylaxis/Anticoagulation: Pharmaceutical: Lovenox 3. Pain Management: tylenol prn 4.  Mood: LCSW to follow for evaluation and support.  5. Neuropsych: This patient is not fully capable of making decisions on his own behalf. 6. Skin/Wound Care: Routine pressure relief measure.  7. Fluids/Electrolytes/Nutrition: Monitor I/O.  8. Anterior STEMI: to continue ASA, Brilinta, BB and statin. 9. Acute systolic heart failure: Has been compensated. Check daily weights and monitor for signs of overload. Continue aldactone, coreg and lisinopril.  . 10. 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 Neurology recommends coumadin plus plavix in one month.  11. MSSA PNA:  No sign of recurrence -pt is an aspiration risk--observe aspiration precautions 12. Acute on chronic renal failure: Diuresis discontinued with  improvement. Continue to monitor as on modified diet without liquids. May need IVF at nights to maintain adequate hydration.  13. Anemia: Slowly improving. Continue to monitor for signs of infection. Check CBC in am.  14. DM type 2: New diagnosis with Hgb A1C-6.9. BS uncontrolled due to tube feeds. Continue lantus daily and will transition to oral medication as intake improves. Monitor BS with ac/hs checks and use SSI for elevated BS. PM CBG elevated, Increase am metformin 15.  HypoK add KCL , f/u BMET K is normal 4.8 16.  Dysphagia, sensory def with silent asp will need pudding thick liq with f/u MBS perhaps this week- Pt would like to get rid of supervision with meals, can verbalize swallowing prec, SLP to address LOS (Days) 6 A FACE TO [place] E 10/21/2015, 7:45 AM

## 2015-10-21 NOTE — Plan of Care (Signed)
Problem: RH SAFETY Goal: RH STG ADHERE TO SAFETY PRECAUTIONS W/ASSISTANCE/DEVICE STG Adhere to Safety Precautions With min Assistance/Device.  Outcome: Not Progressing impulsive     

## 2015-10-21 NOTE — Progress Notes (Signed)
Occupational Therapy Session Note  Patient Details  Name: Eddie [name] MRN: [id number] Date of Birth: 1951-07-16  Today's Date: 10/21/2015 OT Individual Time: 1003-1100 OT Individual Time Calculation (min): 57 min    Short Term Goals: Week 1:  OT Short Term Goal 1 (Week 1): Pt will demonstrate improved dynamic standing balance by engaging in functional activity in standing for 5 minutes with min A for balance OT Short Term Goal 2 (Week 1): Pt will complete toilet task with supervision OT Short Term Goal 3 (Week 1): Pt will complete LB dressing with min A OT Short Term Goal 4 (Week 1): Pt will complete toilet transfer with supervision  Skilled Therapeutic Interventions/Progress Updates:    Pt completed shower and dressing during session.  He was able to gather his clothes and ambulate around the room with min guard assist.  Occasional staggering gait noted at times with pt tending to blame this on not being able to eat his breakfast this morning because he was rushed.  Pt still with decreased emergent awareness that his balance issues are related to his CVA.  He was however able to gather all of his clothing, towels, and washcloths with no more than min questioning cueing during session.  He bathed sit to stand with supervision using the shower bench for support.  Dressing completed sit to stand at the EOB with supervision as well.  Once finished he was able to ambulate to the tub room to practice getting down into and out of a regular tub for bathing.  He demonstrated multiple losses of balance during mobility requiring min assist to correct.  He transferred into and out of the tub floor with supervision using grab bars, which he states he has at home.  While ambulating back to the room had pt identify items in the hallway that contained the color red.  He was able to complete this with 80% accuracy on the left and 90% on the right.  Pt left in bed with bed alarm in place and call button in reach.     Therapy Documentation Precautions:  Precautions Precautions: Fall Precaution Comments: impulsive Restrictions Weight Bearing Restrictions: No  Pain: Pain Assessment Pain Assessment: No/denies pain ADL: See Function Navigator for Current Functional Status.   Therapy/Group: Individual Therapy  Gladyes Kudo OTR/L 10/21/2015, 12:45 PM

## 2015-10-21 NOTE — Progress Notes (Signed)
Speech Language Pathology Daily Session Note  Patient Details  Name: Eddie [name] MRN: [id number] Date of Birth: 1950-09-15  Today's Date: 10/21/2015 SLP Individual Time: 1300-1345 SLP Individual Time Calculation (min): 45 min  Short Term Goals: Week 1: SLP Short Term Goal 1 (Week 1): Pt will sustain attion to a basic familiary task for ~3 minute intervals with mod verbal cues for redirection.  SLP Short Term Goal 2 (Week 1): Pt will answer orientation questions with 90% and mod verbal cues. SLP Short Term Goal 3 (Week 1): Pt will demonstrate basic problem solving for familiar tasks with mod verbal cues.  SLP Short Term Goal 4 (Week 1): Pt will complete swallow strengthening exercises to demonstrate readiness for repeat objective swallow assessment.  SLP Short Term Goal 5 (Week 1): Pt will recall swallow deficits, current diet and compenastory strategies with 80% and mod verbal cues.  SLP Short Term Goal 6 (Week 1): Pt will use compensatory swallow strategies with 80% accuracy and mod verbal cues.   Skilled Therapeutic Interventions: Pt seen for cognitive and dysphagia goals. Pt able to verbalize and demonstrate swallow precautions of upright positioning and  swallowing 2 times per bolus. Pt explained that thickened liquids are required now. Discussed POC for possible reassessment prior to D/C and pt/family training re: current diet prior to D/C. Pt oriented to end of  March and Year and hospital, but consistently thought we were in [place] despite spaced retrieval attempts and written cueing. Pt generally able to attend to a novel game for 15 min mod I and required min verbal cueing to attend to details.    Function:  Eating Eating   Modified Consistency Diet: Yes Eating Assist Level: Swallowing techniques: self managed           Cognition Comprehension Comprehension assist level: Follows basic conversation/direction with extra time/assistive device  Expression   Expression  assist level: Expresses basic 90% of the time/requires cueing < 10% of the time.  Social Interaction Social Interaction assist level: Interacts appropriately with others with medication or extra time (anti-anxiety, antidepressant).  Problem Solving Problem solving assist level: Solves basic 75 - 89% of the time/requires cueing 10 - 24% of the time  Memory Memory assist level: Recognizes or recalls 75 - 89% of the time/requires cueing 10 - 24% of the time    Pain Pain Assessment Pain Assessment: No/denies pain  Therapy/Group: Individual Therapy  Rocky Crafts 10/21/2015, 3:16 PM

## 2015-10-21 NOTE — Progress Notes (Signed)
Nutrition Follow-up  DOCUMENTATION CODES:   Not applicable  INTERVENTION:  Provide Magic cup BID between meals, each supplement provides 290 kcal and 9 grams of protein.  Encourage adequate PO intake.   NUTRITION DIAGNOSIS:   Inadequate oral intake related to dysphagia as evidenced by meal completion < 50%; improving  GOAL:   Patient will meet greater than or equal to 90% of their needs; met  MONITOR:   PO intake, Supplement acceptance, Diet advancement, Labs, Weight trends, Skin, I & O's  REASON FOR ASSESSMENT:   Consult Diet education  ASSESSMENT:   Eddie [name] is a 65 y.o. male who was admitted on 09/28/15 with chest pain with ST elevation due to anterior STEMI and developed unresponsiveness enroute to ED. He was intubated and treated with brief compressions and epi for PEA.  Meal completion has been 60-100%, with most recent po intake at 95-100%. MBS done this AM. Diet has been advanced to a dysphagia 3 diet. Pt continues to be on a pudding thick liquids. First dysphagia 3 meal with speech therapy at 1 pm today. Pt encouraged to eat his food at meals. Nursing staff to provide magic cup between meals to ensure adequate caloric and protein intake.   Labs and medications reviewed.   Diet Order:  DIET DYS 3 Room service appropriate?: Yes; Fluid consistency:: Pudding Thick  Skin:  Reviewed, no issues  Last BM:  3/29  Height:   Ht Readings from Last 1 Encounters:  10/15/15 5' 7" (1.702 m)    Weight:   Wt Readings from Last 1 Encounters:  10/21/15 144 lb 10 oz (65.6 kg)    Ideal Body Weight:  67.3 kg  BMI:  Body mass index is 22.65 kg/(m^2).  Estimated Nutritional Needs:   Kcal:  1700-1900  Protein:  75-90 grams  Fluid:  1.7 - 1.9 L/day  EDUCATION NEEDS:   No education needs identified at this time  Corrin Parker, MS, RD, LDN Pager # [phone] After hours/ weekend pager # [phone]

## 2015-10-21 NOTE — Progress Notes (Signed)
Patient refused CPAP for the night  

## 2015-10-21 NOTE — Progress Notes (Signed)
Physical Therapy Session Note  Patient Details  Name: Eddie [name] MRN: [id number] Date of Birth: 1950-12-21  Today's Date: 10/21/2015 PT Individual Time: 1520-1620 PT Individual Time Calculation (min): 60 min   Short Term Goals: Week 1:  PT Short Term Goal 1 (Week 1): Patient will be able to ambulate at least 50 ft with Mod A with LRAD PT Short Term Goal 2 (Week 1): Patient will demonstrate bed mobility with Supervision A  PT Short Term Goal 3 (Week 1): Patient will propell WC with min A for 37ft.   Skilled Therapeutic Interventions/Progress Updates:   Patient sitting edge of bed with wife present, wife reports she forgot to bring patient's shoes to trial foot up brace but will bring it tomorrow. Patient ambulated within room while bumping into objects with supervision-min guard to urinate in standing with distant supervision. Gait training without AD 2 x 150 ft with close supervision and intermittent scissoring but no LOB. Patient demonstrates high fall risk as noted by score of 42/56 on Berg Balance Scale. Stair training up/down 12 (6") stairs using 2 rails with reciprocal pattern and supervision. Patient requested exercises to work on at home, instructed in [employer] for strengthening and falls prevention standing using parallel bars for UE support: seated LAQ x 10 each LE, standing marching x 20, standing heel raises x 20, standing knee flexion x 10 each LE, standing hip abduction x 10 each LE, squats 2 x 10. Performed Kinetron with UE support at resistance of 0.10 cm/sec for strengthening and endurance x 3 trials with seated rest breaks between: 90 sec and 105 sec in standing, and 90 sec seated. Patient returned to room and left sitting edge of bed with wife present and call bell in reach.   Therapy Documentation Precautions:  Precautions Precautions: Fall Precaution Comments: impulsive Restrictions Weight Bearing Restrictions: No Pain: Pain Assessment Pain Assessment: No/denies  pain Balance: Balance Balance Assessed: Yes Standardized Balance Assessment Standardized Balance Assessment: Berg Balance Test Berg Balance Test Sit to Stand: Able to stand without using hands and stabilize independently Standing Unsupported: Able to stand safely 2 minutes Sitting with Back Unsupported but Feet Supported on Floor or Stool: Able to sit safely and securely 2 minutes Stand to Sit: Sits safely with minimal use of hands Transfers: Able to transfer with verbal cueing and /or supervision Standing Unsupported with Eyes Closed: Able to stand 10 seconds safely Standing Ubsupported with Feet Together: Able to place feet together independently and stand for 1 minute with supervision From Standing, Reach Forward with Outstretched Arm: Can reach confidently >25 cm (10") From Standing Position, Pick up Object from Floor: Able to pick up shoe safely and easily From Standing Position, Turn to Look Behind Over each Shoulder: Looks behind from both sides and weight shifts well Turn 360 Degrees: Needs close supervision or verbal cueing Standing Unsupported, Alternately Place Feet on Step/Stool: Able to stand independently and complete 8 steps >20 seconds Standing Unsupported, One Foot in Front: Loses balance while stepping or standing Standing on One Leg: Tries to lift leg/unable to hold 3 seconds but remains standing independently Total Score: 42/56   See Function Navigator for Current Functional Status.   Therapy/Group: Individual Therapy  Kerney Elbe 10/21/2015, 4:16 PM

## 2015-10-22 ENCOUNTER — Inpatient Hospital Stay (HOSPITAL_COMMUNITY): Payer: BLUE CROSS/BLUE SHIELD | Admitting: Physical Therapy

## 2015-10-22 ENCOUNTER — Inpatient Hospital Stay (HOSPITAL_COMMUNITY): Payer: BLUE CROSS/BLUE SHIELD | Admitting: Speech Pathology

## 2015-10-22 ENCOUNTER — Inpatient Hospital Stay (HOSPITAL_COMMUNITY): Payer: BLUE CROSS/BLUE SHIELD | Admitting: Occupational Therapy

## 2015-10-22 LAB — GLUCOSE, CAPILLARY
GLUCOSE-CAPILLARY: 111 mg/dL — AB (ref 65–99)
GLUCOSE-CAPILLARY: 197 mg/dL — AB (ref 65–99)
GLUCOSE-CAPILLARY: 195 mg/dL — AB (ref 65–99)

## 2015-10-22 LAB — CREATININE, SERUM
Creatinine, Ser: 0.96 mg/dL (ref 0.61–1.24)
GFR calc non Af Amer: >60 mL/min (ref >60)

## 2015-10-22 MED ORDER — METFORMIN HCL 500 MG PO TABS
500.0000 mg | ORAL_TABLET | Freq: Every day | ORAL | Status: DC
  Administered 2015-10-22 – 2015-10-23 (×2): 500 mg via ORAL
  Filled 2015-10-22 (×2): qty 1

## 2015-10-22 NOTE — Progress Notes (Signed)
Occupational Therapy Session Note  Patient Details  Name: Eddie [name] MRN: [id number] Date of Birth: 03/22/1951  Today's Date: 10/22/2015 OT Individual Time: 1300-1330 OT Individual Time Calculation (min): 30 min    Short Term Goals: Week 1:  OT Short Term Goal 1 (Week 1): Pt will demonstrate improved dynamic standing balance by engaging in functional activity in standing for 5 minutes with min A for balance OT Short Term Goal 1 - Progress (Week 1): Met OT Short Term Goal 2 (Week 1): Pt will complete toilet task with supervision OT Short Term Goal 2 - Progress (Week 1): Not met OT Short Term Goal 3 (Week 1): Pt will complete LB dressing with min A OT Short Term Goal 3 - Progress (Week 1): Met OT Short Term Goal 4 (Week 1): Pt will complete toilet transfer with supervision OT Short Term Goal 4 - Progress (Week 1): Not met Week 2:  OT Short Term Goal 1 (Week 2): Pt will complete shower transfers with supervision and no assistive device. OT Short Term Goal 2 (Week 2): Pt will complete toilet transfer 3 consecutive trials with supervision.  OT Short Term Goal 3 (Week 2): Pt will complete all bathing and dressing with supervision sit to stand.   OT Short Term Goal 4 (Week 2): Pt will complete simple home management or meal prep tasks with supervision and min instructional cueing.   Skilled Therapeutic Interventions/Progress Updates:    1:1 continued focus on dynamic balance and cognition skills.  Perform tasks on [employer] in standing with focus on visual scanning, maintaining balance, reaction time, following 2-3 step directions, selective attention etc. Performed task with attention isolated to left and then right, and attention to not hit green lights. Also then add t-scope for added challenge of reading 2 digit numbers and site words.  Pt only able to attend to t-scope 20% of the time while attending to lights. Pt with slower reaction time in lower left corner (between 2.4-3.2) in 4 trials.  Pt with no LOB with tasks and was encouraged to use bilateral UEs.   Therapy Documentation Precautions:  Precautions Precautions: Fall Precaution Comments: impulsive Restrictions Weight Bearing Restrictions: No Pain: Pain Assessment Pain Assessment: No/denies pain  See Function Navigator for Current Functional Status.   Therapy/Group: Individual Therapy  Willeen Cass [facility] 10/22/2015, 2:08 PM

## 2015-10-22 NOTE — Progress Notes (Signed)
Speech Language Pathology Weekly Progress and Session Note  Patient Details  Name: Eddie [name] MRN: [id number] Date of Birth: [birth year]  Beginning of progress report period: October 15, 2015 End of progress report period: October 22, 2015  Today's Date: 10/22/2015 SLP Individual Time: 1400-1500 SLP Individual Time Calculation (min): 60 min  Short Term Goals: Week 1: SLP Short Term Goal 1 (Week 1): Pt will sustain attion to a basic familiary task for ~3 minute intervals with mod verbal cues for redirection.  SLP Short Term Goal 1 - Progress (Week 1): Met SLP Short Term Goal 2 (Week 1): Pt will answer orientation questions with 90% and mod verbal cues. SLP Short Term Goal 2 - Progress (Week 1): Met SLP Short Term Goal 3 (Week 1): Pt will demonstrate basic problem solving for familiar tasks with mod verbal cues.  SLP Short Term Goal 3 - Progress (Week 1): Met SLP Short Term Goal 4 (Week 1): Pt will complete swallow strengthening exercises to demonstrate readiness for repeat objective swallow assessment.  SLP Short Term Goal 4 - Progress (Week 1): Progressing toward goal SLP Short Term Goal 5 (Week 1): Pt will recall swallow deficits, current diet and compenastory strategies with 80% and mod verbal cues.  SLP Short Term Goal 5 - Progress (Week 1): Met SLP Short Term Goal 6 (Week 1): Pt will use compensatory swallow strategies with 80% accuracy and mod verbal cues.  SLP Short Term Goal 6 - Progress (Week 1): Met    New Short Term Goals: Week 2: SLP Short Term Goal 1 (Week 2): Pt will complete swallow strengthening exercises to demonstrate readiness for repeat objective swallow assessment.  SLP Short Term Goal 2 (Week 2): Pt will verbalize and demonstrate understanding of ice chip/free water protocol with min A.  SLP Short Term Goal 3 (Week 2): Pt will demonstrate ability to perform selective attention tasks for >10 min in a mildly distracting environment. SLP Short Term Goal 4 (Week 2): Pt to  demonstrate moderate level reasoning tasks with min A.  Weekly Progress Updates:  Pt has made excellent progress this week particularly with cognition and diet upgrade to Dys 3 consistency. Pt continues to be limited with his ability to manage liquids and remains on pudding-thick.Pt met 5/6 short-term goals. Goals have been updated and pt has potential to continue with excellent gains.    Intensity: Minumum of 1-2 x/day, 30 to 90 minutes Frequency: 3 to 5 out of 7 days Duration/Length of Stay: 10/27/15 Treatment/Interventions: Cognitive remediation/compensation;Cueing hierarchy;Dysphagia/aspiration precaution training;Patient/family education;Oral motor exercises;Therapeutic Exercise;Therapeutic Activities;Speech/Language facilitation;Environmental controls;Functional tasks   Daily Session  Skilled Therapeutic Interventions: Pt participated with learning a novel card game. After an initial learning period, he consistently (95% of the time) played the correct cards on the correct discard pile, but labeled it with the wrong reason at least 50% of the time, for example- he would discard secondary to number, but say "shape" or "color."  He did not demonstrate any awareness of saying an incorrect word, rather he required mod verbal cueing to recognize error and correct. Generally oriented x 4. Pt verbalized swallow precautions with 100% acc.       Function:    Cognition Comprehension Comprehension assist level: Follows basic conversation/direction with extra time/assistive device  Expression   Expression assist level: Expresses basic needs/ideas: With extra time/assistive device  Social Interaction Social Interaction assist level: Interacts appropriately with others with medication or extra time (anti-anxiety, antidepressant).  Problem Solving Problem solving assist level: Solves basic  75 - 89% of the time/requires cueing 10 - 24% of the time  Memory Memory assist level: Recognizes or recalls 75 -  89% of the time/requires cueing 10 - 24% of the time   General    Pain Pain Assessment Pain Assessment: No/denies pain  Therapy/Group: Individual Therapy   Weldon Inches, [place], [place] 10/22/2015, 4:18 PM

## 2015-10-22 NOTE — Plan of Care (Signed)
Problem: RH Balance Goal: LTG Patient will maintain dynamic standing balance (PT) LTG: Patient will maintain dynamic standing balance with assistance during mobility activities (PT)  Upgraded 3/31     Problem: RH Bed to Chair Transfers Goal: LTG Patient will perform bed/chair transfers w/assist (PT) LTG: Patient will perform bed/chair transfers with assistance, with/without cues (PT).  Upgraded 3/31  Problem: RH Wheelchair Mobility Goal: LTG Patient will propel w/c in controlled environment (PT) LTG: Patient will propel wheelchair in controlled environment, # of feet with assist (PT)  Outcome: Not Applicable Date Met:  49/67/59 D/C 3/31 due to pt ambulatory community distances Goal: LTG Patient will propel w/c in community environment (PT) LTG: Patient will propel wheelchair in community environment, # of feet with assist (PT)  Outcome: Not Applicable Date Met:  16/38/46 D/C due to pt ambulatory community distances 10/22/15

## 2015-10-22 NOTE — Progress Notes (Signed)
Occupational Therapy Weekly Progress Note  Patient Details  Name: Eddie [name] MRN: [id number] Date of Birth: 1951-05-12  Beginning of progress report period: October 16, 2015 End of progress report period: October 22, 2015  Today's Date: 10/22/2015 OT Individual Time: 0901-1001 OT Individual Time Calculation (min): 60 min    Patient has met 2 of 4 short term goals.  Mr. Hisaw continues to make steady progress with OT.  He has demonstrated improvement in his awareness as well as being able to remember things from previous sessions and sequence through everyday selfcare tasks.  Although his awareness related to his diagnosis and current events has improved, he still need mod instructional cueing to realize his balance deficits.  Currently, he still requires min assist for balance with functional transfers to the bathroom and shower as well as for mobility in the hallway.  Feel he is making steady gains but will continue to need supervision 24 hour once discharged home next week.  Will continue working toward modified supervision level goals and provide family education with wife as she can participate.    Patient continues to demonstrate the following deficits: decreased balance, decreased awareness, decreased safety awareness, and therefore will continue to benefit from skilled OT intervention to enhance overall performance with BADL.  Patient progressing toward long term goals..  Continue plan of care.  OT Short Term Goals Week 2:  OT Short Term Goal 1 (Week 2): Pt will complete shower transfers with supervision and no assistive device. OT Short Term Goal 2 (Week 2): Pt will complete toilet transfer 3 consecutive trials with supervision.  OT Short Term Goal 3 (Week 2): Pt will complete all bathing and dressing with supervision sit to stand.   OT Short Term Goal 4 (Week 2): Pt will complete simple home management or meal prep tasks with supervision and min instructional cueing.   Skilled  Therapeutic Interventions/Progress Updates:    Pt30 worked on [employer] dressing during session with supervision.  He then proceeded to complete his shaving and cut his hair while standing at the sink, and using an electric clipper.  He needed min assist for thoroughness but maintained standing for 30 mins the entire session.  He needed min guard assist for mobility around the room to clean up items prior to returning to the wheelchair with safety belt in place.   Therapy Documentation Precautions:  Precautions Precautions: Fall Precaution Comments: impulsive Restrictions Weight Bearing Restrictions: No  Pain: Pain Assessment Pain Assessment: No/denies pain ADL: See Function Navigator for Current Functional Status.   Therapy/Group: Individual Therapy  Hassaan Crite OTR/L 10/22/2015, 12:17 PM

## 2015-10-22 NOTE — Progress Notes (Signed)
Subjective/Complaints: Pt swallowing and eating better per his report, appreciate dietary consult, MBS shows silent asp of liquids  ROS- no bowel or bladder issues reported, cold last noc  Objective: Vital Signs: Blood pressure 130/66, pulse 77, temperature 97.8 F (36.6 C), temperature source Oral, resp. rate 16, height 5' 7" (1.702 m), weight 69.1 kg (152 lb 5.4 oz), SpO2 100 %. Dg Swallowing Func-speech Pathology  10/21/2015  Objective Swallowing Evaluation: Type of Study: MBS-Modified Barium Swallow Study Patient Details Name: Eddie [name] MRN: [id number] Date of Birth: 1950/07/31 Today's Date: 10/21/2015 Time: SLP Start Time: 0903-SLP Stop Time : 0930 SLP Time Calculation (min) (ACUTE ONLY): 27 min Past Medical History: Past Medical History Diagnosis Date . PUD (peptic ulcer disease)  . CAD (coronary artery disease)    a. STEMI 09/2015 w/ DES to Prox LAD . CVA (cerebral infarction)    a. 09/2015: acute small right frontal lobe infarct Past Surgical History: Past Surgical History Procedure Laterality Date . Repair of peptic ulcer   . Cardiac catheterization N/A 09/28/2015   Procedure: Left Heart Cath and Coronary Angiography;  Surgeon: Lorretta Harp, MD;  Location: [facility];  Service: Cardiovascular;  Laterality: N/A; . Cardiac catheterization N/A 10/08/2015   Procedure: Left Heart Cath and Coronary Angiography;  Surgeon: Belva Crome, MD;  Location: [facility];  Service: Cardiovascular;  Laterality: N/A; HPI: 65 year old male admitted 3/7 chest pain, becoming unresponsive shortely before arrival to ED. Patient with cardiac arrest (brief chest compressions),  NSTEMI, intubated. Possible CVA symptoms noted on 3/13. 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 Extubated 3/20.  No Data Recorded Assessment / Plan / Recommendation CHL IP CLINICAL IMPRESSIONS 10/21/2015 Therapy Diagnosis Moderate pharyngeal phase dysphagia Clinical Impression Pt  presents with a moderate sensorimotor dysphagia.  Decreased pharyngeal sensation resulted in delayed swallow initiation to either the vallecula or pyriform sinuses depending on bolus size and viscosity.  Smaller boluses of thicker viscosities triggered swallow response at the vallecula whereas large boluses and thin liquids resulted in delay to the pyriforms.  Decreased hyolaryngeal excursion and base of tongue retraction resulted in residue at the pyriform sinuses>vallecula.  Decreased epiglottic inversion resulting from weakened hyolaryngeal elevation also resulted in decreased airway closure during the swallow.  Airway was protected relatively well with initial boluses of thickened and thin liquid consistencies; however, piecemeal swallowing resulted in materials spilling into the pyriforms on top of already pooled residue which then lead to silent aspiration of thin and honey thick liquids before second swallow regardless of bolus size.  No aspiration with purees or solids.  Recommend that pt remain on pudding thick liquids but solids may be advanced to dys 3 given pt's significantly improved cognition in comparison to previous study.  Ice chips recommended following oral care to continue working towards liquids advancement.  Prognosis for advancement good with improved pharyngeal sensation and with ST interventions for management of diet progression and pharyngeal strengthening exercises.   Impact on safety and function Moderate aspiration risk     Prognosis 10/15/2015 Prognosis for Safe Diet Advancement Good Barriers to Reach Goals Severity of deficits;Cognitive deficits Barriers/Prognosis Comment -- CHL IP DIET RECOMMENDATION 10/21/2015 SLP Diet Recommendations Dysphagia 3 (Mech soft) solids;Pudding thick liquid Liquid Administration via Spoon Medication Administration Crushed with puree Compensations Minimize environmental distractions;Slow rate;Small sips/bites;Multiple dry swallows after each  bite/sip;Effortful swallow;Clear throat intermittently Postural Changes Remain semi-upright after after feeds/meals (Comment)   CHL IP OTHER RECOMMENDATIONS  10/21/2015 Recommended Consults -- Oral Care Recommendations Oral care BID Other Recommendations --          CHL IP ORAL PHASE 10/21/2015 Oral Phase WFL Oral - Pudding Teaspoon -- Oral - Pudding Cup -- Oral - Honey Teaspoon -- Oral - Honey Cup -- Oral - Nectar Teaspoon -- Oral - Nectar Cup -- Oral - Nectar Straw -- Oral - Thin Teaspoon -- Oral - Thin Cup -- Oral - Thin Straw -- Oral - Puree -- Oral - Mech Soft -- Oral - Regular -- Oral - Multi-Consistency -- Oral - Pill -- Oral Phase - Comment --  CHL IP PHARYNGEAL PHASE 10/21/2015 Pharyngeal Phase Impaired Pharyngeal- Pudding Teaspoon -- Pharyngeal -- Pharyngeal- Pudding Cup -- Pharyngeal -- Pharyngeal- Honey Teaspoon Delayed swallow initiation-vallecula;Reduced epiglottic inversion;Reduced laryngeal elevation;Reduced tongue base retraction;Pharyngeal residue - pyriform;Pharyngeal residue - valleculae;Penetration/Apiration before swallow;Reduced airway/laryngeal closure Pharyngeal Material enters airway, passes BELOW cords without attempt by patient to eject out (silent aspiration) Pharyngeal- Honey Cup Delayed swallow initiation-pyriform sinuses;Reduced epiglottic inversion;Reduced laryngeal elevation;Reduced airway/laryngeal closure;Reduced tongue base retraction;Penetration/Apiration before;Pharyngeal residue - pyriform;Pharyngeal residue - valleculae Pharyngeal Material enters airway, passes BELOW cords without attempt by patient to eject out (silent aspiration) Pharyngeal- Nectar Teaspoon -- Pharyngeal -- Pharyngeal- Nectar Cup -- Pharyngeal -- Pharyngeal- Nectar Straw -- Pharyngeal -- Pharyngeal- Thin Teaspoon Delayed swallow initiation-pyriform sinuses;Reduced epiglottic inversion;Reduced airway/laryngeal closure;Reduced laryngeal elevation;Pharyngeal residue - pyriform;Pharyngeal residue -  valleculae;Reduced tongue base retraction;Penetration/Aspiration during swallow Pharyngeal Material enters airway, CONTACTS cords and not ejected out Pharyngeal- Thin Cup Delayed swallow initiation-pyriform sinuses;Reduced epiglottic inversion;Reduced laryngeal elevation;Reduced airway/laryngeal closure;Reduced tongue base retraction;Pharyngeal residue - valleculae;Pharyngeal residue - pyriform;Penetration/Apiration before swallow Pharyngeal Material enters airway, passes BELOW cords without attempt by patient to eject out (silent aspiration) Pharyngeal- Thin Straw -- Pharyngeal -- Pharyngeal- Puree Delayed swallow initiation-vallecula;Reduced epiglottic inversion;Reduced laryngeal elevation;Reduced airway/laryngeal closure;Reduced tongue base retraction Pharyngeal -- Pharyngeal- Mechanical Soft -- Pharyngeal -- Pharyngeal- Regular Delayed swallow initiation-vallecula;Reduced epiglottic inversion;Reduced laryngeal elevation;Reduced airway/laryngeal closure;Reduced tongue base retraction Pharyngeal -- Pharyngeal- Multi-consistency -- Pharyngeal -- Pharyngeal- Pill -- Pharyngeal -- Pharyngeal Comment --  CHL IP CERVICAL ESOPHAGEAL PHASE 10/21/2015 Cervical Esophageal Phase Impaired Pudding Teaspoon -- Pudding Cup -- Honey Teaspoon -- Honey Cup -- Nectar Teaspoon -- Nectar Cup -- Nectar Straw -- Thin Teaspoon -- Thin Cup -- Thin Straw -- Puree -- Mechanical Soft -- Regular -- Multi-consistency -- Pill -- Cervical Esophageal Comment decreased UES relaxation  No flowsheet data found. Page, Selinda Orion 10/21/2015, 3:47 PM              Results for orders placed or performed during the hospital encounter of 10/15/15 (from the past 72 hour(s))  Glucose, capillary     Status: Abnormal   Collection Time: 10/19/15 11:44 AM  Result Value Ref Range   Glucose-Capillary 197 (H) 65 - 99 mg/dL  Glucose, capillary     Status: Abnormal   Collection Time: 10/19/15  4:45 PM  Result Value Ref Range   Glucose-Capillary 221 (H) 65 - 99  mg/dL  Glucose, capillary     Status: Abnormal   Collection Time: 10/19/15  8:55 PM  Result Value Ref Range   Glucose-Capillary 114 (H) 65 - 99 mg/dL  Glucose, capillary     Status: Abnormal   Collection Time: 10/20/15  6:38 AM  Result Value Ref Range   Glucose-Capillary 115 (H) 65 - 99 mg/dL  Glucose, capillary     Status: Abnormal   Collection Time: 10/20/15 11:19 AM  Result Value Ref Range  Glucose-Capillary 230 (H) 65 - 99 mg/dL   Comment 1 Notify RN   Glucose, capillary     Status: Abnormal   Collection Time: 10/20/15  4:42 PM  Result Value Ref Range   Glucose-Capillary 173 (H) 65 - 99 mg/dL   Comment 1 Notify RN   Glucose, capillary     Status: Abnormal   Collection Time: 10/20/15  9:11 PM  Result Value Ref Range   Glucose-Capillary 228 (H) 65 - 99 mg/dL   Comment 1 Notify RN   Renal function panel     Status: Abnormal   Collection Time: 10/21/15  4:33 AM  Result Value Ref Range   Sodium 139 135 - 145 mmol/L   Potassium 4.8 3.5 - 5.1 mmol/L    Comment: DELTA CHECK NOTED   Chloride 107 101 - 111 mmol/L   CO2 22 22 - 32 mmol/L   Glucose, Bld 121 (H) 65 - 99 mg/dL   BUN 14 6 - 20 mg/dL   Creatinine, Ser 1.03 0.61 - 1.24 mg/dL   Calcium 8.3 (L) 8.9 - 10.3 mg/dL   Phosphorus 2.6 2.5 - 4.6 mg/dL   Albumin 2.5 (L) 3.5 - 5.0 g/dL   GFR calc non Af Amer >60 >60 mL/min   GFR calc Af Amer >60 >60 mL/min    Comment: (NOTE) The eGFR has been calculated using the CKD EPI equation. This calculation has not been validated in all clinical situations. eGFR's persistently <60 mL/min signify possible Chronic Kidney Disease.    Anion gap 10 5 - 15  Glucose, capillary     Status: Abnormal   Collection Time: 10/21/15  6:42 AM  Result Value Ref Range   Glucose-Capillary 106 (H) 65 - 99 mg/dL   Comment 1 Notify RN   Glucose, capillary     Status: Abnormal   Collection Time: 10/21/15 11:08 AM  Result Value Ref Range   Glucose-Capillary 150 (H) 65 - 99 mg/dL   Comment 1 Notify RN    Glucose, capillary     Status: Abnormal   Collection Time: 10/21/15  4:36 PM  Result Value Ref Range   Glucose-Capillary 241 (H) 65 - 99 mg/dL   Comment 1 Notify RN   Glucose, capillary     Status: Abnormal   Collection Time: 10/21/15  8:44 PM  Result Value Ref Range   Glucose-Capillary 165 (H) 65 - 99 mg/dL   Comment 1 Notify RN   Creatinine, serum     Status: None   Collection Time: 10/22/15  5:30 AM  Result Value Ref Range   Creatinine, Ser 0.96 0.61 - 1.24 mg/dL   GFR calc non Af Amer >60 >60 mL/min   GFR calc Af Amer >60 >60 mL/min    Comment: (NOTE) The eGFR has been calculated using the CKD EPI equation. This calculation has not been validated in all clinical situations. eGFR's persistently <60 mL/min signify possible Chronic Kidney Disease.   Glucose, capillary     Status: Abnormal   Collection Time: 10/22/15  6:21 AM  Result Value Ref Range   Glucose-Capillary 111 (H) 65 - 99 mg/dL   Comment 1 Notify RN       General: No acute distress Mood and affect are appropriate Heart: Regular rate and rhythm no rubs murmurs or extra sounds Lungs: Clear to auscultation, breathing unlabored, no rales or wheezes Abdomen: Positive bowel sounds, soft nontender to palpation, nondistended Extremities: No clubbing, cyanosis, or edema Skin: No evidence of breakdown, no evidence of  rash Neurologic: Cranial nerves II through XII intact, motor strength is 5/5 in bilateral deltoid, bicep, tricep, grip, hip flexor, knee extensors, ankle dorsiflexor and plantar flexor Sensory exam normal sensation to light touch and proprioception in bilateral upper and lower extremities  Cerebellar exam mild dysmetria RUE Musculoskeletal: Full range of motion in all 4 extremities. No joint swelling   Assessment/Plan: 1. Functional deficits secondary to Cognitive, mobility, and functional deficits secondary to anoxic encephalopathy and right frontal infarct which require 3+ hours per day of  interdisciplinary therapy in a comprehensive inpatient rehab setting. Physiatrist is providing close team supervision and 24 hour management of active medical problems listed below. Physiatrist and rehab team continue to assess barriers to discharge/monitor patient progress toward functional and medical goals. FIM: Function - Bathing Position: Shower Body parts bathed by patient: Right arm, Left arm, Chest, Abdomen, Front perineal area, Buttocks, Right upper leg, Left upper leg, Right lower leg, Left lower leg Body parts bathed by helper: Back Bathing not applicable: Back Assist Level: Supervision or verbal cues  Function- Upper Body Dressing/Undressing What is the patient wearing?: Pull over shirt/dress Pull over shirt/dress - Perfomed by patient: Thread/unthread right sleeve, Thread/unthread left sleeve, Put head through opening, Pull shirt over trunk Assist Level: Supervision or verbal cues Function - Lower Body Dressing/Undressing What is the patient wearing?: Underwear, Pants, Socks, Shoes Position: Wheelchair/chair at [employer] - Performed by patient: Thread/unthread right underwear leg, Thread/unthread left underwear leg, Pull underwear up/down Pants- Performed by patient: Pull pants up/down, Fasten/unfasten pants Non-skid slipper socks- Performed by helper: Don/doff right sock, Don/doff left sock Socks - Performed by patient: Don/doff right sock, Don/doff left sock Shoes - Performed by patient: Don/doff left shoe, Don/doff right shoe, Fasten left (no laces for right shoe) Assist for footwear: Dependant Assist for lower body dressing: Touching or steadying assistance (Pt > 75%)  Function - Toileting Toileting activity did not occur:  (did not need to complete toileting task with OT) Toileting steps completed by patient: Adjust clothing prior to toileting, Performs perineal hygiene, Adjust clothing after toileting Toileting steps completed by helper: Adjust clothing after  toileting Toileting Assistive Devices: Grab bar or rail Assist level: Supervision or verbal cues  Function Midwife transfer assistive device: Grab bar Assist level to toilet: Touching or steadying assistance (Pt > 75%) Assist level from toilet: Touching or steadying assistance (Pt > 75%)  Function - Chair/bed transfer Chair/bed transfer method: Ambulatory Chair/bed transfer assist level: Supervision or verbal cues Chair/bed transfer assistive device: Armrests Chair/bed transfer details: Verbal cues for precautions/safety, Tactile cues for posture  Function - Locomotion: Wheelchair Will patient use wheelchair at discharge?: No Type: Manual Wheelchair activity did not occur: Refused Max wheelchair distance: 120 Assist Level: Supervision or verbal cues Assist Level: Supervision or verbal cues Assist Level: Supervision or verbal cues Turns around,maneuvers to table,bed, and toilet,negotiates 3% grade,maneuvers on rugs and over doorsills: No Function - Locomotion: Ambulation Assistive device: No device Max distance: 150 ft Assist level: Supervision or verbal cues Assist level: Supervision or verbal cues Walk 50 feet with 2 turns activity did not occur: Refused Assist level: Supervision or verbal cues Walk 150 feet activity did not occur: Safety/medical concerns Assist level: Supervision or verbal cues Walk 10 feet on uneven surfaces activity did not occur: Safety/medical concerns  Function - Comprehension Comprehension: Auditory Comprehension assist level: Follows basic conversation/direction with extra time/assistive device  Function - Expression Expression: Verbal Expression assist level: Expresses basic 90% of the time/requires cueing <  10% of the time.  Function - Social Interaction Social Interaction assist level: Interacts appropriately with others with medication or extra time (anti-anxiety, antidepressant).  Function - Problem Solving Problem  solving assist level: Solves basic 75 - 89% of the time/requires cueing 10 - 24% of the time  Function - Memory Memory assist level: Recognizes or recalls 75 - 89% of the time/requires cueing 10 - 24% of the time Patient normally able to recall (first 3 days only): Staff names and faces, That he or she is in a hospital, Current season  Medical Problem List and Plan: 1. Cognitive, mobility, and functional deficits secondary to anoxic encephalopathy and right frontal infarct-supervision toileting 2. DVT Prophylaxis/Anticoagulation: Pharmaceutical: Lovenox 3. Pain Management: tylenol prn 4. Mood: LCSW to follow for evaluation and support.  5. Neuropsych: This patient is not fully capable of making decisions on his own behalf. 6. Skin/Wound Care: Routine pressure relief measure.  7. Fluids/Electrolytes/Nutrition: Monitor I/O.  8. Anterior STEMI: to continue ASA, Brilinta, BB and statin. 9. Acute systolic heart failure: Has been compensated. Check daily weights and monitor for signs of overload. Continue aldactone, coreg and lisinopril.  . 10. 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 Neurology recommends coumadin plus plavix in one month.  11. MSSA PNA:  No sign of recurrence -pt is an aspiration risk--observe aspiration precautions 12. Acute on chronic renal failure: Diuresis discontinued with improvement. Continue to monitor as on modified diet without liquids. May need IVF at nights to maintain adequate hydration.  13. Anemia: Slowly improving. Continue to monitor for signs of infection. Check CBC in am.  14. DM type 2: New diagnosis with Hgb A1C-6.9. BS uncontrolled due to tube feeds. Continue lantus daily and will transition to oral medication as intake improves. Monitor BS with ac/hs checks and use SSI for elevated BS. PM CBG elevated, start am metformin 15.  HypoK add KCL , f/u BMET K is normal 4.8 16.  Dysphagia, sensory def with silent asp will need pudding thick liq with f/u MBS  upgrade to D3 but still pudding thick LOS (Days) 7 A FACE TO FACE EVALUATION WAS PERFORMED  KIRSTEINS,ANDREW E 10/22/2015, 7:45 AM

## 2015-10-22 NOTE — Progress Notes (Signed)
Physical Therapy Weekly Progress Note  Patient Details  Name: Ademide Schaberg MRN: [id number] Date of Birth: [birth year]  Beginning of progress report period: October 16, 2015 End of progress report period: October 22, 2015  Today's Date: 10/22/2015 PT Individual Time: 1007-1105 PT Individual Time Calculation (min): 58 min   Patient has made good progress and has met 2 of 3 short term goals with w/c goal being discontinued due to pt ambulatory community distances.  Pt is currently supervision-min A for basic transfers, gait and stair negotiation and scored a 42/56 on the BERG balance assessment.  Foot up brace has been recommended for pt to improve safety with gait but awaiting pt's wife to bring in a pair of lace up shoes.     Patient continues to demonstrate the following deficits: impaired balance and postural control, impaired gait, L sided weakness and therefore will continue to benefit from skilled PT intervention to enhance overall performance with balance, postural control, ability to compensate for deficits and functional use of  left upper extremity and left lower extremity.  Patient progressing toward long term goals..  Plan of care revisions: Transfer goals upgraded to Mod I; w/c goals discontinued; car, floor and furniture transfer goals added and stair goal adjusted to simulate home entry/exit.  PT Short Term Goals Week 1:  PT Short Term Goal 1 (Week 1): Patient will be able to ambulate at least 50 ft with Mod A with LRAD PT Short Term Goal 1 - Progress (Week 1): Met PT Short Term Goal 2 (Week 1): Patient will demonstrate bed mobility with Supervision A  PT Short Term Goal 2 - Progress (Week 1): Met PT Short Term Goal 3 (Week 1): Patient will propell WC with min A for 34f.  PT Short Term Goal 3 - Progress (Week 1): Discontinued (comment) Week 2:  PT Short Term Goal 1 (Week 2): = LTG with D/C next week 10/27/15  Skilled Therapeutic Interventions/Progress Updates:   Pt received in w/c;  confirmed with pt D/C plan, assistance and equipment available at D/C and home set up.  Pt reports his home is very accessible and he was going to purchase a 3 point cane from drug store; discussed with pt possible use of quad cane and being able to order one prior to D/C; pt agreeable.  Pt also reports PT yesterday advising him to purchase a chair without wheels to perform OTAGO HEP with since most of his chair are rolling.  Pt still awaiting wife to deliver his lace up shoes in order to trial foot up brace.    Pt performed gait without AD x 150' with min A with 2-3 episodes of lateral LOB but no foot drop or shuffle noted.  Reviewed car transfer with pt; pt performed with supervision with pt requiring min A when exiting car due to standing before LLE out of car.  Reviewed safe sequence to exit car.  In ADL apartment pt demonstrate safe transfer in/out of flat bed Mod I and mod I bed mobility and rolling.  In gym pt performed one step negotiation training initially without AD but then transitioning to use of quad cane in RUE due to multiple LOB when stepping up/down and pivoting and pt did demonstrate increased foot drag with stair negotiation on LLE.  Required min A without AD; educated pt on safe sequence for negotiating one step with quad cane with pt return demonstrating x 5 reps with supervision.  Reviewed falls risk with pt and educated pt on  floor transfer; pt return demonstrated with supervision.  Performed dynamic standing balance training on foam with feet apart >> feet together for increased balance reaction training while playing tennis with bilat UE holding racket and rotating to L and R to reach out of BOS and across midline; required min-max A to maintain balance.  Returned to room and pt left in w/c with quick release belt in place and all items within reach.  Therapy Documentation Precautions:  Precautions Precautions: Fall Precaution Comments: impulsive Restrictions Weight Bearing  Restrictions: No Pain: Pain Assessment Pain Assessment: No/denies pain  See Function Navigator for Current Functional Status.  Therapy/Group: Individual Therapy  Raylene Everts [facility] 10/22/2015, 12:32 PM

## 2015-10-22 NOTE — Plan of Care (Signed)
Problem: RH Stairs Goal: LTG Patient will ambulate up and down stairs w/assist (PT) LTG: Patient will ambulate up and down # of stairs with assistance (PT)  Adjusted to simulate home entry/exit 10/22/15     Problem: RH Car Transfers Goal: LTG Patient will perform car transfers with assist (PT) LTG: Patient will perform car transfers with assistance (PT). Goal added 10/22/15     Problem: RH Furniture Transfers Goal: LTG Patient will perform furniture transfers w/assist (OT/PT LTG: Patient will perform furniture transfers with assistance (OT/PT). Goal added 10/22/15     Problem: RH Floor Transfers Goal: LTG Patient will perform floor transfers w/assist (PT) LTG: Patient will perform floor transfers with assistance (PT). Goal added 10/22/15

## 2015-10-23 ENCOUNTER — Inpatient Hospital Stay (HOSPITAL_COMMUNITY): Payer: BLUE CROSS/BLUE SHIELD | Admitting: *Deleted

## 2015-10-23 ENCOUNTER — Inpatient Hospital Stay (HOSPITAL_COMMUNITY): Payer: BLUE CROSS/BLUE SHIELD | Admitting: Occupational Therapy

## 2015-10-23 LAB — GLUCOSE, CAPILLARY
GLUCOSE-CAPILLARY: 148 mg/dL — AB (ref 65–99)
Glucose-Capillary: 152 mg/dL — ABNORMAL HIGH (ref 65–99)
Glucose-Capillary: 216 mg/dL — ABNORMAL HIGH (ref 65–99)
Glucose-Capillary: 331 mg/dL — ABNORMAL HIGH (ref 65–99)

## 2015-10-23 LAB — RENAL FUNCTION PANEL
ANION GAP: 7 (ref 5–15)
Albumin: 2.5 g/dL — ABNORMAL LOW (ref 3.5–5.0)
BUN: 10 mg/dL (ref 6–20)
CHLORIDE: 106 mmol/L (ref 101–111)
CO2: 24 mmol/L (ref 22–32)
Calcium: 8.2 mg/dL — ABNORMAL LOW (ref 8.9–10.3)
Creatinine, Ser: 1.05 mg/dL (ref 0.61–1.24)
Glucose, Bld: 114 mg/dL — ABNORMAL HIGH (ref 65–99)
Phosphorus: 3 mg/dL (ref 2.5–4.6)
Potassium: 4.4 mmol/L (ref 3.5–5.1)
SODIUM: 137 mmol/L (ref 135–145)

## 2015-10-23 NOTE — Progress Notes (Signed)
Subjective/Complaints:  Some HA last 2 ams  ROS- no bowel or bladder issues reported, cold last noc  Objective: Vital Signs: Blood pressure 158/88, pulse 81, temperature 98.3 F (36.8 C), temperature source Oral, resp. rate 18, height '5\' 7"'$  (1.702 m), weight 69.763 kg (153 lb 12.8 oz), SpO2 100 %. Dg Swallowing Func-speech Pathology  10/21/2015  Objective Swallowing Evaluation: Type of Study: MBS-Modified Barium Swallow Study Patient Details Name: Eddie [name] MRN: [id number] Date of Birth: Nov 23, 1950 Today's Date: 10/21/2015 Time: SLP Start Time: 0903-SLP Stop Time : 0930 SLP Time Calculation (min) (ACUTE ONLY): 27 min Past Medical History: Past Medical History Diagnosis Date . PUD (peptic ulcer disease)  . CAD (coronary artery disease)    a. STEMI 09/2015 w/ DES to Prox LAD . CVA (cerebral infarction)    a. 09/2015: acute small right frontal lobe infarct Past Surgical History: Past Surgical History Procedure Laterality Date . Repair of peptic ulcer   . Cardiac catheterization N/A 09/28/2015   Procedure: Left Heart Cath and Coronary Angiography;  Surgeon: Lorretta Harp, MD;  Location: [facility];  Service: Cardiovascular;  Laterality: N/A; . Cardiac catheterization N/A 10/08/2015   Procedure: Left Heart Cath and Coronary Angiography;  Surgeon: Belva Crome, MD;  Location: [facility];  Service: Cardiovascular;  Laterality: N/A; HPI: 65 year old male admitted 3/7 chest pain, becoming unresponsive shortely before arrival to ED. Patient with cardiac arrest (brief chest compressions),  NSTEMI, intubated. Possible CVA symptoms noted on 3/13. 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 Extubated 3/20.  No Data Recorded Assessment / Plan / Recommendation CHL IP CLINICAL IMPRESSIONS 10/21/2015 Therapy Diagnosis Moderate pharyngeal phase dysphagia Clinical Impression Pt presents with a moderate sensorimotor dysphagia.  Decreased pharyngeal sensation  resulted in delayed swallow initiation to either the vallecula or pyriform sinuses depending on bolus size and viscosity.  Smaller boluses of thicker viscosities triggered swallow response at the vallecula whereas large boluses and thin liquids resulted in delay to the pyriforms.  Decreased hyolaryngeal excursion and base of tongue retraction resulted in residue at the pyriform sinuses>vallecula.  Decreased epiglottic inversion resulting from weakened hyolaryngeal elevation also resulted in decreased airway closure during the swallow.  Airway was protected relatively well with initial boluses of thickened and thin liquid consistencies; however, piecemeal swallowing resulted in materials spilling into the pyriforms on top of already pooled residue which then lead to silent aspiration of thin and honey thick liquids before second swallow regardless of bolus size.  No aspiration with purees or solids.  Recommend that pt remain on pudding thick liquids but solids may be advanced to dys 3 given pt's significantly improved cognition in comparison to previous study.  Ice chips recommended following oral care to continue working towards liquids advancement.  Prognosis for advancement good with improved pharyngeal sensation and with ST interventions for management of diet progression and pharyngeal strengthening exercises.   Impact on safety and function Moderate aspiration risk     Prognosis 10/15/2015 Prognosis for Safe Diet Advancement Good Barriers to Reach Goals Severity of deficits;Cognitive deficits Barriers/Prognosis Comment -- CHL IP DIET RECOMMENDATION 10/21/2015 SLP Diet Recommendations Dysphagia 3 (Mech soft) solids;Pudding thick liquid Liquid Administration via Spoon Medication Administration Crushed with puree Compensations Minimize environmental distractions;Slow rate;Small sips/bites;Multiple dry swallows after each bite/sip;Effortful swallow;Clear throat intermittently Postural Changes Remain semi-upright  after after feeds/meals (Comment)   CHL IP OTHER RECOMMENDATIONS 10/21/2015 Recommended Consults -- Oral Care Recommendations Oral care BID Other  Recommendations --          CHL IP ORAL PHASE 10/21/2015 Oral Phase WFL Oral - Pudding Teaspoon -- Oral - Pudding Cup -- Oral - Honey Teaspoon -- Oral - Honey Cup -- Oral - Nectar Teaspoon -- Oral - Nectar Cup -- Oral - Nectar Straw -- Oral - Thin Teaspoon -- Oral - Thin Cup -- Oral - Thin Straw -- Oral - Puree -- Oral - Mech Soft -- Oral - Regular -- Oral - Multi-Consistency -- Oral - Pill -- Oral Phase - Comment --  CHL IP PHARYNGEAL PHASE 10/21/2015 Pharyngeal Phase Impaired Pharyngeal- Pudding Teaspoon -- Pharyngeal -- Pharyngeal- Pudding Cup -- Pharyngeal -- Pharyngeal- Honey Teaspoon Delayed swallow initiation-vallecula;Reduced epiglottic inversion;Reduced laryngeal elevation;Reduced tongue base retraction;Pharyngeal residue - pyriform;Pharyngeal residue - valleculae;Penetration/Apiration before swallow;Reduced airway/laryngeal closure Pharyngeal Material enters airway, passes BELOW cords without attempt by patient to eject out (silent aspiration) Pharyngeal- Honey Cup Delayed swallow initiation-pyriform sinuses;Reduced epiglottic inversion;Reduced laryngeal elevation;Reduced airway/laryngeal closure;Reduced tongue base retraction;Penetration/Apiration before;Pharyngeal residue - pyriform;Pharyngeal residue - valleculae Pharyngeal Material enters airway, passes BELOW cords without attempt by patient to eject out (silent aspiration) Pharyngeal- Nectar Teaspoon -- Pharyngeal -- Pharyngeal- Nectar Cup -- Pharyngeal -- Pharyngeal- Nectar Straw -- Pharyngeal -- Pharyngeal- Thin Teaspoon Delayed swallow initiation-pyriform sinuses;Reduced epiglottic inversion;Reduced airway/laryngeal closure;Reduced laryngeal elevation;Pharyngeal residue - pyriform;Pharyngeal residue - valleculae;Reduced tongue base retraction;Penetration/Aspiration during swallow Pharyngeal Material enters  airway, CONTACTS cords and not ejected out Pharyngeal- Thin Cup Delayed swallow initiation-pyriform sinuses;Reduced epiglottic inversion;Reduced laryngeal elevation;Reduced airway/laryngeal closure;Reduced tongue base retraction;Pharyngeal residue - valleculae;Pharyngeal residue - pyriform;Penetration/Apiration before swallow Pharyngeal Material enters airway, passes BELOW cords without attempt by patient to eject out (silent aspiration) Pharyngeal- Thin Straw -- Pharyngeal -- Pharyngeal- Puree Delayed swallow initiation-vallecula;Reduced epiglottic inversion;Reduced laryngeal elevation;Reduced airway/laryngeal closure;Reduced tongue base retraction Pharyngeal -- Pharyngeal- Mechanical Soft -- Pharyngeal -- Pharyngeal- Regular Delayed swallow initiation-vallecula;Reduced epiglottic inversion;Reduced laryngeal elevation;Reduced airway/laryngeal closure;Reduced tongue base retraction Pharyngeal -- Pharyngeal- Multi-consistency -- Pharyngeal -- Pharyngeal- Pill -- Pharyngeal -- Pharyngeal Comment --  CHL IP CERVICAL ESOPHAGEAL PHASE 10/21/2015 Cervical Esophageal Phase Impaired Pudding Teaspoon -- Pudding Cup -- Honey Teaspoon -- Honey Cup -- Nectar Teaspoon -- Nectar Cup -- Nectar Straw -- Thin Teaspoon -- Thin Cup -- Thin Straw -- Puree -- Mechanical Soft -- Regular -- Multi-consistency -- Pill -- Cervical Esophageal Comment decreased UES relaxation  No flowsheet data found. Page, Selinda Orion 10/21/2015, 3:47 PM              Results for orders placed or performed during the hospital encounter of 10/15/15 (from the past 72 hour(s))  Glucose, capillary     Status: Abnormal   Collection Time: 10/20/15 11:19 AM  Result Value Ref Range   Glucose-Capillary 230 (H) 65 - 99 mg/dL   Comment 1 Notify RN   Glucose, capillary     Status: Abnormal   Collection Time: 10/20/15  4:42 PM  Result Value Ref Range   Glucose-Capillary 173 (H) 65 - 99 mg/dL   Comment 1 Notify RN   Glucose, capillary     Status: Abnormal    Collection Time: 10/20/15  9:11 PM  Result Value Ref Range   Glucose-Capillary 228 (H) 65 - 99 mg/dL   Comment 1 Notify RN   Renal function panel     Status: Abnormal   Collection Time: 10/21/15  4:33 AM  Result Value Ref Range   Sodium 139 135 - 145 mmol/L   Potassium 4.8 3.5 - 5.1 mmol/L    Comment:  DELTA CHECK NOTED   Chloride 107 101 - 111 mmol/L   CO2 22 22 - 32 mmol/L   Glucose, Bld 121 (H) 65 - 99 mg/dL   BUN 14 6 - 20 mg/dL   Creatinine, Ser 8.00 0.61 - 1.24 mg/dL   Calcium 8.3 (L) 8.9 - 10.3 mg/dL   Phosphorus 2.6 2.5 - 4.6 mg/dL   Albumin 2.5 (L) 3.5 - 5.0 g/dL   GFR calc non Af Amer >60 >60 mL/min   GFR calc Af Amer >60 >60 mL/min    Comment: (NOTE) The eGFR has been calculated using the CKD EPI equation. This calculation has not been validated in all clinical situations. eGFR's persistently <60 mL/min signify possible Chronic Kidney Disease.    Anion gap 10 5 - 15  Glucose, capillary     Status: Abnormal   Collection Time: 10/21/15  6:42 AM  Result Value Ref Range   Glucose-Capillary 106 (H) 65 - 99 mg/dL   Comment 1 Notify RN   Glucose, capillary     Status: Abnormal   Collection Time: 10/21/15 11:08 AM  Result Value Ref Range   Glucose-Capillary 150 (H) 65 - 99 mg/dL   Comment 1 Notify RN   Glucose, capillary     Status: Abnormal   Collection Time: 10/21/15  4:36 PM  Result Value Ref Range   Glucose-Capillary 241 (H) 65 - 99 mg/dL   Comment 1 Notify RN   Glucose, capillary     Status: Abnormal   Collection Time: 10/21/15  8:44 PM  Result Value Ref Range   Glucose-Capillary 165 (H) 65 - 99 mg/dL   Comment 1 Notify RN   Creatinine, serum     Status: None   Collection Time: 10/22/15  5:30 AM  Result Value Ref Range   Creatinine, Ser 0.96 0.61 - 1.24 mg/dL   GFR calc non Af Amer >60 >60 mL/min   GFR calc Af Amer >60 >60 mL/min    Comment: (NOTE) The eGFR has been calculated using the CKD EPI equation. This calculation has not been validated in all  clinical situations. eGFR's persistently <60 mL/min signify possible Chronic Kidney Disease.   Glucose, capillary     Status: Abnormal   Collection Time: 10/22/15  6:21 AM  Result Value Ref Range   Glucose-Capillary 111 (H) 65 - 99 mg/dL   Comment 1 Notify RN   Glucose, capillary     Status: Abnormal   Collection Time: 10/22/15 11:41 AM  Result Value Ref Range   Glucose-Capillary 197 (H) 65 - 99 mg/dL  Glucose, capillary     Status: Abnormal   Collection Time: 10/22/15  8:48 PM  Result Value Ref Range   Glucose-Capillary 195 (H) 65 - 99 mg/dL   Comment 1 Notify RN   Renal function panel     Status: Abnormal   Collection Time: 10/23/15  6:00 AM  Result Value Ref Range   Sodium 137 135 - 145 mmol/L   Potassium 4.4 3.5 - 5.1 mmol/L   Chloride 106 101 - 111 mmol/L   CO2 24 22 - 32 mmol/L   Glucose, Bld 114 (H) 65 - 99 mg/dL   BUN 10 6 - 20 mg/dL   Creatinine, Ser 6.32 0.61 - 1.24 mg/dL   Calcium 8.2 (L) 8.9 - 10.3 mg/dL   Phosphorus 3.0 2.5 - 4.6 mg/dL   Albumin 2.5 (L) 3.5 - 5.0 g/dL   GFR calc non Af Amer >60 >60 mL/min   GFR calc Af  Amer >60 >60 mL/min    Comment: (NOTE) The eGFR has been calculated using the CKD EPI equation. This calculation has not been validated in all clinical situations. eGFR's persistently <60 mL/min signify possible Chronic Kidney Disease.    Anion gap 7 5 - 15  Glucose, capillary     Status: Abnormal   Collection Time: 10/23/15  6:45 AM  Result Value Ref Range   Glucose-Capillary 152 (H) 65 - 99 mg/dL   Comment 1 Notify RN       General: No acute distress Mood and affect are appropriate Heart: Regular rate and rhythm no rubs murmurs or extra sounds Lungs: Clear to auscultation, breathing unlabored, no rales or wheezes Abdomen: Positive bowel sounds, soft nontender to palpation, nondistended Extremities: No clubbing, cyanosis, or edema Skin: No evidence of breakdown, no evidence of rash Neurologic: Cranial nerves II through XII intact,  motor strength is 5/5 in bilateral deltoid, bicep, tricep, grip, hip flexor, knee extensors, ankle dorsiflexor and plantar flexor Sensory exam normal sensation to light touch and proprioception in bilateral upper and lower extremities  Cerebellar exam mild dysmetria RUE Musculoskeletal: Full range of motion in all 4 extremities. No joint swelling   Assessment/Plan: 1. Functional deficits secondary to Cognitive, mobility, and functional deficits secondary to anoxic encephalopathy and right frontal infarct which require 3+ hours per day of interdisciplinary therapy in a comprehensive inpatient rehab setting. Physiatrist is providing close team supervision and 24 hour management of active medical problems listed below. Physiatrist and rehab team continue to assess barriers to discharge/monitor patient progress toward functional and medical goals. FIM: Function - Bathing Position: Shower Body parts bathed by patient: Right arm, Left arm, Chest, Abdomen, Front perineal area, Buttocks, Right upper leg, Left upper leg, Right lower leg, Left lower leg Body parts bathed by helper: Back Bathing not applicable: Back Assist Level: Supervision or verbal cues  Function- Upper Body Dressing/Undressing What is the patient wearing?: Pull over shirt/dress Pull over shirt/dress - Perfomed by patient: Thread/unthread right sleeve, Thread/unthread left sleeve, Put head through opening, Pull shirt over trunk Assist Level: Supervision or verbal cues Function - Lower Body Dressing/Undressing What is the patient wearing?: Socks, Pants Position: Wheelchair/chair at sink Underwear - Performed by patient: Thread/unthread right underwear leg, Thread/unthread left underwear leg, Pull underwear up/down Pants- Performed by patient: Pull pants up/down, Thread/unthread right pants leg, Thread/unthread left pants leg Non-skid slipper socks- Performed by helper: Don/doff right sock, Don/doff left sock Socks - Performed by  patient: Don/doff right sock, Don/doff left sock Shoes - Performed by patient: Don/doff left shoe, Don/doff right shoe, Fasten left (no laces for right shoe) Assist for footwear: Dependant Assist for lower body dressing: Supervision or verbal cues  Function - Toileting Toileting activity did not occur:  (did not need to complete toileting task with OT) Toileting steps completed by patient: Adjust clothing prior to toileting, Performs perineal hygiene, Adjust clothing after toileting Toileting steps completed by helper: Adjust clothing after toileting Toileting Assistive Devices: Grab bar or rail Assist level: Supervision or verbal cues  Function Midwife transfer assistive device: Grab bar Assist level to toilet: Touching or steadying assistance (Pt > 75%) Assist level from toilet: Touching or steadying assistance (Pt > 75%)  Function - Chair/bed transfer Chair/bed transfer method: Ambulatory Chair/bed transfer assist level: Touching or steadying assistance (Pt > 75%) Chair/bed transfer assistive device: Armrests Chair/bed transfer details: Verbal cues for precautions/safety, Tactile cues for posture  Function - Locomotion: Wheelchair Will patient use wheelchair  at discharge?: No Type: Manual Wheelchair activity did not occur: Refused Max wheelchair distance: 120 Assist Level: Supervision or verbal cues Assist Level: Supervision or verbal cues Assist Level: Supervision or verbal cues Turns around,maneuvers to table,bed, and toilet,negotiates 3% grade,maneuvers on rugs and over doorsills: No Function - Locomotion: Ambulation Assistive device: No device Max distance: 150 ft Assist level: Touching or steadying assistance (Pt > 75%) Assist level: Touching or steadying assistance (Pt > 75%) Walk 50 feet with 2 turns activity did not occur: Refused Assist level: Touching or steadying assistance (Pt > 75%) Walk 150 feet activity did not occur: Safety/medical  concerns Assist level: Touching or steadying assistance (Pt > 75%) Walk 10 feet on uneven surfaces activity did not occur: Safety/medical concerns  Function - Comprehension Comprehension: Auditory Comprehension assist level: Follows basic conversation/direction with no assist  Function - Expression Expression: Verbal Expression assist level: Expresses basic needs/ideas: With no assist  Function - Social Interaction Social Interaction assist level: Interacts appropriately with others - No medications needed.  Function - Problem Solving Problem solving assist level: Solves basic problems with no assist  Function - Memory Memory assist level: Recognizes or recalls 90% of the time/requires cueing < 10% of the time Patient normally able to recall (first 3 days only): Staff names and faces, That he or she is in a hospital, Current season  Medical Problem List and Plan: 1. Cognitive, mobility, and functional deficits secondary to anoxic encephalopathy and right frontal infarct-supervision toileting 2. DVT Prophylaxis/Anticoagulation: Pharmaceutical: Lovenox 3. Pain Management: tylenol prn, frontal HA x 2 am relieved by tylenol 4. Mood: LCSW to follow for evaluation and support.  5. Neuropsych: This patient is not fully capable of making decisions on his own behalf. 6. Skin/Wound Care: Routine pressure relief measure.  7. Fluids/Electrolytes/Nutrition: Monitor I/O.  8. Anterior STEMI: to continue ASA, Brilinta, BB and statin. 9. Acute systolic heart failure: Has been compensated. Check daily weights and monitor for signs of overload. Continue aldactone, coreg and lisinopril.  . 10. 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 Neurology recommends coumadin plus plavix in one month.  11. MSSA PNA:  No sign of recurrence -pt is an aspiration risk--observe aspiration precautions 12. Acute on chronic renal failure: Diuresis discontinued with improvement. Continue to monitor as on modified  diet without liquids. May need IVF at nights to maintain adequate hydration.  13. Anemia: Slowly improving. Continue to monitor for signs of infection. Check CBC in am.  14. DM type 2: New diagnosis with Hgb A1C-6.9. BS uncontrolled due to tube feeds. Continue lantus daily and will transition to oral medication as intake improves. Monitor BS with ac/hs checks and use SSI for elevated BS. PM CBG elevated, increase am metformin if not improved today CBG (last 3)   Recent Labs  10/22/15 1141 10/22/15 2048 10/23/15 0645  GLUCAP 197* 195* 152*    15.  HypoK add KCL , f/u BMET K is normal 4.8 16.  Dysphagia, sensory def with silent asp will need pudding thick liq with f/u MBS upgrade to D3 but still pudding thick LOS (Days) 8 A FACE TO FACE EVALUATION WAS PERFORMED  KIRSTEINS,ANDREW E 10/23/2015, 8:20 AM

## 2015-10-23 NOTE — Progress Notes (Signed)
Occupational Therapy Session Note  Patient Details  Name: Eddie [name] MRN: [id number] Date of Birth: [birth year]  Today's Date: 10/23/2015 OT Individual Time:  -   1330-1400  (30 min)      Short Term Goals: Week 1:  OT Short Term Goal 1 (Week 1): Pt will demonstrate improved dynamic standing balance by engaging in functional activity in standing for 5 minutes with min A for balance OT Short Term Goal 1 - Progress (Week 1): Met OT Short Term Goal 2 (Week 1): Pt will complete toilet task with supervision OT Short Term Goal 2 - Progress (Week 1): Not met OT Short Term Goal 3 (Week 1): Pt will complete LB dressing with min A OT Short Term Goal 3 - Progress (Week 1): Met OT Short Term Goal 4 (Week 1): Pt will complete toilet transfer with supervision OT Short Term Goal 4 - Progress (Week 1): Not met Week 2:  OT Short Term Goal 1 (Week 2): Pt will complete shower transfers with supervision and no assistive device. OT Short Term Goal 2 (Week 2): Pt will complete toilet transfer 3 consecutive trials with supervision.  OT Short Term Goal 3 (Week 2): Pt will complete all bathing and dressing with supervision sit to stand.   OT Short Term Goal 4 (Week 2): Pt will complete simple home management or meal prep tasks with supervision and min instructional cueing.   Skilled Therapeutic Interventions/Progress Updates:    Pt sleeping  In bed upon OT arrival.  Pt. sat on EOB. And engaged in LUE exercises using PNF patterns.  Ambulated to batrhroom with SBA assist.  Discussed how and who to talk to about a handicapped parking sticker.  Left information for pt to contact MSW.   Pt. Left with call bell, phone and all needs in reach.     Therapy Documentation Precautions:  Precautions Precautions: Fall Precaution Comments: impulsive Restrictions Weight Bearing Restrictions: No l:  Pain:  none     See Function Navigator for Current Functional Status.   Therapy/Group: Individual Therapy  Lisa Roca 10/23/2015, 2:00 PM

## 2015-10-24 ENCOUNTER — Inpatient Hospital Stay (HOSPITAL_COMMUNITY): Payer: BLUE CROSS/BLUE SHIELD | Admitting: Physical Therapy

## 2015-10-24 LAB — GLUCOSE, CAPILLARY
GLUCOSE-CAPILLARY: 199 mg/dL — AB (ref 65–99)
Glucose-Capillary: 96 mg/dL (ref 65–99)
Glucose-Capillary: 120 mg/dL — ABNORMAL HIGH (ref 65–99)

## 2015-10-24 MED ORDER — METFORMIN HCL 850 MG PO TABS
850.0000 mg | ORAL_TABLET | Freq: Every day | ORAL | Status: DC
  Administered 2015-10-24 – 2015-10-26 (×3): 850 mg via ORAL
  Filled 2015-10-24 (×3): qty 1

## 2015-10-24 NOTE — Progress Notes (Signed)
Physical Therapy Session Note  Patient Details  Name: Artemus Romanoff MRN: [id number] Date of Birth: 02-20-1951  Today's Date: 10/24/2015 PT Individual Time: 1100-1200 PT Individual Time Calculation (min): 60 min   Short Term Goals: Week 1:  PT Short Term Goal 1 (Week 1): Patient will be able to ambulate at least 50 ft with Mod A with LRAD PT Short Term Goal 1 - Progress (Week 1): Met PT Short Term Goal 2 (Week 1): Patient will demonstrate bed mobility with Supervision A  PT Short Term Goal 2 - Progress (Week 1): Met PT Short Term Goal 3 (Week 1): Patient will propell WC with min A for 41f.  PT Short Term Goal 3 - Progress (Week 1): Discontinued (comment)  Skilled Therapeutic Interventions/Progress Updates:  Pt was seen bedside in the am. Pt transferred supine to edge of bed with side rail and S. Pt transferred [facility]. Pt ambulated 150 feet with S To min guard with SBQC and verbal cues. Pt performed step taps 1 set x 10 reps each, 3 sets x 10 reps each for cone taps, alternating cone taps and criss cross cone taps for LE strengthening. Pt rode recumbent bike 10 minutes at level 2 without rest break. Pt ascended/descended 1 curb with SBQC and min guard to c/s. Pt ascended/descended 12 stairs with B rails and S. Pt amb[facility] to min guard. Pt left sitting up in recliner with quick release belt in place and call bell within reach.    Therapy Documentation Precautions:  Precautions Precautions: Fall Precaution Comments: impulsive Restrictions Weight Bearing Restrictions: No General:   Pain: No c/o pain.   See Function Navigator for Current Functional Status.   Therapy/Group: Individual Therapy  MDub Amis4/08/2015, 12:54 PM

## 2015-10-24 NOTE — Progress Notes (Signed)
Subjective/Complaints:  No HA, waiting on breakfast  ROS- no bowel or bladder issues reported, cold last noc  Objective: Vital Signs: Blood pressure 147/81, pulse 88, temperature 98.3 F (36.8 C), temperature source Oral, resp. rate 18, height _0  (1.702 m), weight 67.8 kg (149 lb 7.6 oz), SpO2 100 %. No results found. Results for orders placed or performed during the hospital encounter of 10/15/15 (from the past 72 hour(s))  Glucose, capillary     Status: Abnormal   Collection Time: 10/21/15 11:08 AM  Result Value Ref Range   Glucose-Capillary 150 (H) 65 - 99 mg/dL   Comment 1 Notify RN   Glucose, capillary     Status: Abnormal   Collection Time: 10/21/15  4:36 PM  Result Value Ref Range   Glucose-Capillary 241 (H) 65 - 99 mg/dL   Comment 1 Notify RN   Glucose, capillary     Status: Abnormal   Collection Time: 10/21/15  8:44 PM  Result Value Ref Range   Glucose-Capillary 165 (H) 65 - 99 mg/dL   Comment 1 Notify RN   Creatinine, serum     Status: None   Collection Time: 10/22/15  5:30 AM  Result Value Ref Range   Creatinine, Ser 0.96 0.61 - 1.24 mg/dL   GFR calc non Af Amer >60 >60 mL/min   GFR calc Af Amer >60 >60 mL/min    Comment: (NOTE) The eGFR has been calculated using the CKD EPI equation. This calculation has not been validated in all clinical situations. eGFR's persistently <60 mL/min signify possible Chronic Kidney Disease.   Glucose, capillary     Status: Abnormal   Collection Time: 10/22/15  6:21 AM  Result Value Ref Range   Glucose-Capillary 111 (H) 65 - 99 mg/dL   Comment 1 Notify RN   Glucose, capillary     Status: Abnormal   Collection Time: 10/22/15 11:41 AM  Result Value Ref Range   Glucose-Capillary 197 (H) 65 - 99 mg/dL  Glucose, capillary     Status: Abnormal   Collection Time: 10/22/15  8:48 PM  Result Value Ref Range   Glucose-Capillary 195 (H) 65 - 99 mg/dL   Comment 1 Notify RN   Renal function panel     Status: Abnormal   Collection  Time: 10/23/15  6:00 AM  Result Value Ref Range   Sodium 137 135 - 145 mmol/L   Potassium 4.4 3.5 - 5.1 mmol/L   Chloride 106 101 - 111 mmol/L   CO2 24 22 - 32 mmol/L   Glucose, Bld 114 (H) 65 - 99 mg/dL   BUN 10 6 - 20 mg/dL   Creatinine, Ser 1.05 0.61 - 1.24 mg/dL   Calcium 8.2 (L) 8.9 - 10.3 mg/dL   Phosphorus 3.0 2.5 - 4.6 mg/dL   Albumin 2.5 (L) 3.5 - 5.0 g/dL   GFR calc non Af Amer >60 >60 mL/min   GFR calc Af Amer >60 >60 mL/min    Comment: (NOTE) The eGFR has been calculated using the CKD EPI equation. This calculation has not been validated in all clinical situations. eGFR's persistently <60 mL/min signify possible Chronic Kidney Disease.    Anion gap 7 5 - 15  Glucose, capillary     Status: Abnormal   Collection Time: 10/23/15  6:45 AM  Result Value Ref Range   Glucose-Capillary 152 (H) 65 - 99 mg/dL   Comment 1 Notify RN   Glucose, capillary     Status: Abnormal   Collection Time: 10/23/15  11:40 AM  Result Value Ref Range   Glucose-Capillary 216 (H) 65 - 99 mg/dL   Comment 1 Notify RN   Glucose, capillary     Status: Abnormal   Collection Time: 10/23/15  4:44 PM  Result Value Ref Range   Glucose-Capillary 148 (H) 65 - 99 mg/dL   Comment 1 Notify RN   Glucose, capillary     Status: Abnormal   Collection Time: 10/23/15  8:35 PM  Result Value Ref Range   Glucose-Capillary 331 (H) 65 - 99 mg/dL   Comment 1 Notify RN   Glucose, capillary     Status: None   Collection Time: 10/24/15  6:36 AM  Result Value Ref Range   Glucose-Capillary 96 65 - 99 mg/dL   Comment 1 Notify RN       General: No acute distress Mood and affect are appropriate Heart: Regular rate and rhythm no rubs murmurs or extra sounds Lungs: Clear to auscultation, breathing unlabored, no rales or wheezes Abdomen: Positive bowel sounds, soft nontender to palpation, nondistended Extremities: No clubbing, cyanosis, or edema Skin: No evidence of breakdown, no evidence of rash Neurologic: Cranial  nerves II through XII intact, motor strength is 5/5 in bilateral deltoid, bicep, tricep, grip, hip flexor, knee extensors, ankle dorsiflexor and plantar flexor Sensory exam normal sensation to light touch and proprioception in bilateral upper and lower extremities  Cerebellar exam mild dysmetria RUE Musculoskeletal: Full range of motion in all 4 extremities. No joint swelling   Assessment/Plan: 1. Functional deficits secondary to Cognitive, mobility, and functional deficits secondary to anoxic encephalopathy and right frontal infarct which require 3+ hours per day of interdisciplinary therapy in a comprehensive inpatient rehab setting. Physiatrist is providing close team supervision and 24 hour management of active medical problems listed below. Physiatrist and rehab team continue to assess barriers to discharge/monitor patient progress toward functional and medical goals. FIM: Function - Bathing Position: Shower Body parts bathed by patient: Right arm, Left arm, Chest, Abdomen, Front perineal area, Buttocks, Right upper leg, Left upper leg, Right lower leg, Left lower leg Body parts bathed by helper: Back Bathing not applicable: Back Assist Level: Supervision or verbal cues  Function- Upper Body Dressing/Undressing What is the patient wearing?: Pull over shirt/dress Pull over shirt/dress - Perfomed by patient: Thread/unthread right sleeve, Thread/unthread left sleeve, Put head through opening, Pull shirt over trunk Assist Level: Supervision or verbal cues Function - Lower Body Dressing/Undressing What is the patient wearing?: Socks, Pants Position: Wheelchair/chair at sink Underwear - Performed by patient: Thread/unthread right underwear leg, Thread/unthread left underwear leg, Pull underwear up/down Pants- Performed by patient: Pull pants up/down, Thread/unthread right pants leg, Thread/unthread left pants leg Non-skid slipper socks- Performed by helper: Don/doff right sock, Don/doff  left sock Socks - Performed by patient: Don/doff right sock, Don/doff left sock Shoes - Performed by patient: Don/doff left shoe, Don/doff right shoe, Fasten left (no laces for right shoe) Assist for footwear: Dependant Assist for lower body dressing: Supervision or verbal cues  Function - Toileting Toileting activity did not occur:  (did not need to complete toileting task with OT) Toileting steps completed by patient: Adjust clothing prior to toileting, Performs perineal hygiene, Adjust clothing after toileting Toileting steps completed by helper: Adjust clothing after toileting Toileting Assistive Devices: Grab bar or rail Assist level: Supervision or verbal cues  Function - Air cabin crew transfer assistive device: Grab bar Assist level to toilet: Touching or steadying assistance (Pt > 75%) Assist level from toilet: Touching  or steadying assistance (Pt > 75%)  Function - Chair/bed transfer Chair/bed transfer method: Ambulatory Chair/bed transfer assist level: Touching or steadying assistance (Pt > 75%) Chair/bed transfer assistive device: Armrests Chair/bed transfer details: Verbal cues for precautions/safety, Tactile cues for posture  Function - Locomotion: Wheelchair Will patient use wheelchair at discharge?: No Type: Manual Wheelchair activity did not occur: Refused Max wheelchair distance: 120 Assist Level: Supervision or verbal cues Assist Level: Supervision or verbal cues Assist Level: Supervision or verbal cues Turns around,maneuvers to table,bed, and toilet,negotiates 3% grade,maneuvers on rugs and over doorsills: No Function - Locomotion: Ambulation Assistive device: No device Max distance: 150 ft Assist level: Touching or steadying assistance (Pt > 75%) Assist level: Touching or steadying assistance (Pt > 75%) Walk 50 feet with 2 turns activity did not occur: Refused Assist level: Touching or steadying assistance (Pt > 75%) Walk 150 feet activity did not  occur: Safety/medical concerns Assist level: Touching or steadying assistance (Pt > 75%) Walk 10 feet on uneven surfaces activity did not occur: Safety/medical concerns  Function - Comprehension Comprehension: Auditory Comprehension assist level: Follows basic conversation/direction with no assist  Function - Expression Expression: Verbal Expression assist level: Expresses basic needs/ideas: With no assist  Function - Social Interaction Social Interaction assist level: Interacts appropriately with others - No medications needed.  Function - Problem Solving Problem solving assist level: Solves basic problems with no assist  Function - Memory Memory assist level: Recognizes or recalls 90% of the time/requires cueing < 10% of the time Patient normally able to recall (first 3 days only): Staff names and faces, That he or she is in a hospital, Current season  Medical Problem List and Plan: 1. Cognitive, mobility, and functional deficits secondary to anoxic encephalopathy and right frontal infarct-Sup Mobility 2. DVT Prophylaxis/Anticoagulation: Pharmaceutical: Lovenox 3. Pain Management: tylenol prn, frontal HA x 2 am relieved by tylenol 4. Mood: LCSW to follow for evaluation and support.  5. Neuropsych: This patient is not fully capable of making decisions on his own behalf. 6. Skin/Wound Care: Routine pressure relief measure.  7. Fluids/Electrolytes/Nutrition: Monitor I/O.  8. Anterior STEMI: to continue ASA, Brilinta, BB and statin. 9. Acute systolic heart failure: Has been compensated. Check daily weights and monitor for signs of overload. Continue aldactone, coreg and lisinopril.  . 10. 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 Neurology recommends coumadin plus plavix in one month.  11. MSSA PNA:  No sign of recurrence -pt is an aspiration risk--observe aspiration precautions 12. Acute on chronic renal failure: Diuresis discontinued with improvement. Continue to monitor as on  modified diet without liquids. May need IVF at nights to maintain adequate hydration.  13. Anemia: Slowly improving. Continue to monitor for signs of infection. Check CBC in am.  14. DM type 2: New diagnosis with Hgb A1C-6.9. BS uncontrolled due to tube feeds. Continue lantus daily and will transition to oral medication as intake improves. Monitor BS with ac/hs checks and use SSI for elevated BS. PM CBG elevated,AM CBG is good, increase am glucophage CBG (last 3)   Recent Labs  10/23/15 1644 10/23/15 2035 10/24/15 0636  GLUCAP 148* 331* 96    15.  HypoK add KCL , f/u BMET K is normal 4.8 16.  Dysphagia, sensory def with silent asp upgrade to D3 but still pudding thick, ?repeat MBS prior to D/C LOS (Days) 9 A FACE TO FACE EVALUATION WAS PERFORMED  KIRSTEINS,ANDREW E 10/24/2015, 7:41 AM

## 2015-10-25 ENCOUNTER — Inpatient Hospital Stay (HOSPITAL_COMMUNITY): Payer: BLUE CROSS/BLUE SHIELD | Admitting: Occupational Therapy

## 2015-10-25 ENCOUNTER — Inpatient Hospital Stay (HOSPITAL_COMMUNITY): Payer: BLUE CROSS/BLUE SHIELD | Admitting: Speech Pathology

## 2015-10-25 ENCOUNTER — Inpatient Hospital Stay (HOSPITAL_COMMUNITY): Payer: BLUE CROSS/BLUE SHIELD | Admitting: Physical Therapy

## 2015-10-25 LAB — BASIC METABOLIC PANEL
ANION GAP: 9 (ref 5–15)
BUN: 12 mg/dL (ref 6–20)
CALCIUM: 8.8 mg/dL — AB (ref 8.9–10.3)
CO2: 25 mmol/L (ref 22–32)
Chloride: 104 mmol/L (ref 101–111)
Creatinine, Ser: 1.1 mg/dL (ref 0.61–1.24)
Glucose, Bld: 77 mg/dL (ref 65–99)
Potassium: 4.7 mmol/L (ref 3.5–5.1)
Sodium: 138 mmol/L (ref 135–145)

## 2015-10-25 LAB — GLUCOSE, CAPILLARY
GLUCOSE-CAPILLARY: 175 mg/dL — AB (ref 65–99)
GLUCOSE-CAPILLARY: 94 mg/dL (ref 65–99)
GLUCOSE-CAPILLARY: 73 mg/dL (ref 65–99)
Glucose-Capillary: 277 mg/dL — ABNORMAL HIGH (ref 65–99)
Glucose-Capillary: 211 mg/dL — ABNORMAL HIGH (ref 65–99)

## 2015-10-25 LAB — CBC
HCT: 31.2 % — ABNORMAL LOW (ref 39.0–52.0)
HEMOGLOBIN: 9.6 g/dL — AB (ref 13.0–17.0)
MCH: 24.7 pg — ABNORMAL LOW (ref 26.0–34.0)
MCHC: 30.8 g/dL (ref 30.0–36.0)
MCV: 80.4 fL (ref 78.0–100.0)
Platelets: 374 10*3/uL (ref 150–400)
RBC: 3.88 MIL/uL — AB (ref 4.22–5.81)
RDW: 16.8 % — ABNORMAL HIGH (ref 11.5–15.5)
WBC: 9.8 10*3/uL (ref 4.0–10.5)

## 2015-10-25 MED ORDER — POTASSIUM CHLORIDE 20 MEQ/15ML (10%) PO SOLN
20.0000 meq | Freq: Every day | ORAL | Status: DC
  Filled 2015-10-25: qty 15

## 2015-10-25 MED ORDER — INSULIN GLARGINE 100 UNIT/ML ~~LOC~~ SOLN
5.0000 [IU] | Freq: Every day | SUBCUTANEOUS | Status: DC
  Administered 2015-10-25: 5 [IU] via SUBCUTANEOUS
  Filled 2015-10-25 (×2): qty 0.05

## 2015-10-25 NOTE — Plan of Care (Signed)
Problem: Food- and Nutrition-Related Knowledge Deficit (NB-1.1) Goal: Nutrition education Formal process to instruct or train a patient/client in a skill or to impart knowledge to help patients/clients voluntarily manage or modify food choices and eating behavior to maintain or improve health. Outcome: Completed/Met Date Met:  10/25/15 RD consulted for diet education.   RD provided "Dysphagia Level 3: Advanced" from th Academy of [employer] Dietetics Manual and "Thickened Liquids: Pudding Thick" handout. Wife at beside. A list of foods recommended and not recommended were discussed. Reviewed pudding thick liquids and the importance of following this fluid consistency. Pt and wife has previously worked with speech therapy and ensure they comprehend the information. Teach back method used. RD contact information given.   Expect good compliance.  Intake has been adequate with po intake 75-100%. Labs and medications reviewed. Plans for discharge tomorrow. Re-consult RD if needed.  Corrin Parker, MS, RD, LDN Pager # [phone] After hours/ weekend pager # [phone]

## 2015-10-25 NOTE — Progress Notes (Signed)
Social Work Patient ID: Eddie [name], male   DOB: 1951/01/31, 65 y.o.   MRN: [id number] According to MD pt is anxious to go home and would like to go tomorrow instead of Wed. Will check with team and make sure family education has been completed. Concern is he is still on pudding thick liquids, will check with Speech therapy and team regarding earlier discharge.

## 2015-10-25 NOTE — Progress Notes (Signed)
Occupational Therapy Session Note  Patient Details  Name: Eddie [name] MRN: [id number] Date of Birth: 03/31/1951  Today's Date: 10/25/2015 OT Individual Time: 1300-1356 OT Individual Time Calculation (min): 56 min    Short Term Goals: Week 1:  OT Short Term Goal 1 (Week 1): Pt will demonstrate improved dynamic standing balance by engaging in functional activity in standing for 5 minutes with min A for balance OT Short Term Goal 1 - Progress (Week 1): Met OT Short Term Goal 2 (Week 1): Pt will complete toilet task with supervision OT Short Term Goal 2 - Progress (Week 1): Not met OT Short Term Goal 3 (Week 1): Pt will complete LB dressing with min A OT Short Term Goal 3 - Progress (Week 1): Met OT Short Term Goal 4 (Week 1): Pt will complete toilet transfer with supervision OT Short Term Goal 4 - Progress (Week 1): Not met  Skilled Therapeutic Interventions/Progress Updates:    Pt ambulated around the hospital with supervision, using external aides to help locate the gift shop and the cafeteria.  Pt with occasional staggering and veering but no LOB noted when going down hill and up hill on solid surface.  Still feel like he needs supervision if walking on grades however.  In the gift shop had him locate 2 items for me (gum and get well card) with min questioning cueing to recall 1/2 items.  He could remember "card" but couldn't recall what type.  He needed mod questioning cueing to locate items using the maps around the hospital as well.  Once we were back up to the floor, he demonstrated moderated difficulty writing down the return time on the pt sign out sheet.  He could state the time but then wrote what he stated.  Ex "it's 10 minutes until 2" he wrote 10:02.  Finished session with return to the room.  Pt left sitting EOB with call button and phone in reach.  Bed alarm not placed.  Feel pt is ok to move around the room without assistance but not in the hallway yet.    Therapy  Documentation Precautions:  Precautions Precautions: Fall Precaution Comments: impulsive Restrictions Weight Bearing Restrictions: No  Pain: Pain Assessment Pain Assessment: No/denies pain ADL: See Function Navigator for Current Functional Status.   Therapy/Group: Individual Therapy  Klyde Banka OTR/L 10/25/2015, 4:21 PM

## 2015-10-25 NOTE — Progress Notes (Signed)
Social Work Patient ID: Eddie [name], male   DOB: 16-Sep-1950, 65 y.o.   MRN: [id number] Have left message for pt';s wife to come in today @ 3:00 to attend Speech therapy and for education. Pt is voicing he will not follow the recommendations of pudding thick liquid and will  Instead drink thin liquids at home. Will make referral for Outpatient therapies, he does not want anyone to come to his home. Will work on discharge for tomorrow.

## 2015-10-25 NOTE — Progress Notes (Signed)
Speech Language Pathology Daily Session Note  Patient Details  Name: Eddie [name] MRN: [id number] Date of Birth: 1950-12-13  Today's Date: 10/25/2015 SLP Individual Time: 4580-9983 SLP Individual Time Calculation (min): 28 min  Short Term Goals: Week 2: SLP Short Term Goal 1 (Week 2): Pt will complete swallow strengthening exercises to demonstrate readiness for repeat objective swallow assessment.  SLP Short Term Goal 2 (Week 2): Pt will verbalize and demonstrate understanding of ice chip/[name] water protocol with min A.  SLP Short Term Goal 3 (Week 2): Pt will demonstrate ability to perform selective attention tasks for >10 min in a mildly distracting environment. SLP Short Term Goal 4 (Week 2): Pt to demonstrate moderate level reasoning tasks with min A.  Skilled Therapeutic Interventions:    Pt was seen for skilled ST targeting family education prior to discharge.  Pt's wife was present during today's therapy session.  SLP provided skilled education regarding rationale behind currently prescribed diet and results of most recent MBS, including silent aspiration/penetration with consistencies thinner than pudding thick.   Pt's wife returned demonstration of how to thicken liquids to the appropriate viscosity following initial instruction with mod I.  Pt and pt's wife educated that pt may have ice chips at home as long as he waits 30 minutes in between meals and cleans his mouth thoroughly prior to ice chips.  SLP reviewed and reinforced risk of aspiration if pt was not compliant with swallowing precautions.  Also reviewed s/s of PNA and when to contact MD should symptoms arise given that pt will not necessarily show clinical indicators of aspiration.  Pt and pt's wife verbalized understanding but question whether pt will be compliant given the extent of his diet modifications and reports from other staff members.  All pt's and wife's questions were answered at this time.  Handouts were provided to  facilitate carryover in the home environment.  Pt left sitting at edge of bed with wife at bedside.     Function:  Eating Eating   Modified Consistency Diet: Yes Eating Assist Level: More than reasonable amount of time           Cognition Comprehension Comprehension assist level: Follows basic conversation/direction with no assist  Expression   Expression assist level: Expresses basic needs/ideas: With no assist  Social Interaction Social Interaction assist level: Interacts appropriately 75 - 89% of the time - Needs redirection for appropriate language or to initiate interaction.  Problem Solving Problem solving assist level: Solves basic 90% of the time/requires cueing < 10% of the time  Memory Memory assist level: Recognizes or recalls 75 - 89% of the time/requires cueing 10 - 24% of the time    Pain Pain Assessment Pain Assessment: No/denies pain  Therapy/Group: Individual Therapy  Alanii Ramer, Melanee Spry 10/25/2015, 4:55 PM

## 2015-10-25 NOTE — Progress Notes (Addendum)
Physical Therapy Note  Patient Details  Name: Eddie [name] MRN: [id number] Date of Birth: [birth year] Today's Date: 10/25/2015    Time: [phone] 70 minutes  1:1 No c/o pain. Pt states he is frustrated by bed alarm and not being able to walk around room alone, PT educated on importance of safety and reducing falls. Pt states he understands and is agreeable to treatment.  Dynamic gait training throughout unit up to 1000' before seated rest. Pt performed speed and direction changes, head turns, stop/start and uneven surfaces with distant supervision, pt able to self correct minor LOB.  Berg balance test redone, pt scored 46/56, improved but still at risk for falls. Pt states he understands he is at a fall risk and that he will "be very careful" at home.  Standing balance training on trampoline with wt shifts all directions and UE movements without UE support. Mini squats and heel raises with 1 UE support. Pt using ankle strategy well on trampoline for balance.  Pt able to gait while carrying items and engaging in conversation without LOB, able to gait increased distances without seated rest.  Pt performed furniture transfers and floor transfer with mod I, no cuing needed. Pt states he feels ready to d/c home tomorrow per MD request.   Reggy Eye 10/25/2015, 11:31 AM

## 2015-10-25 NOTE — Progress Notes (Signed)
Blood sugars low today despite good intake--likely due to effect of increased metformin. Will decrease pm dose lantus. May not require insulin at home if [facility] continue to remain low.

## 2015-10-25 NOTE — Progress Notes (Signed)
Occupational Therapy Session Note  Patient Details  Name: Eddie [name] MRN: [id number] Date of Birth: September 30, 1950  Today's Date: 10/25/2015 OT Individual Time: 9735-3299 OT Individual Time Calculation (min): 57 min    Short Term Goals: Week 2:  OT Short Term Goal 1 (Week 2): Pt will complete shower transfers with supervision and no assistive device. OT Short Term Goal 2 (Week 2): Pt will complete toilet transfer 3 consecutive trials with supervision.  OT Short Term Goal 3 (Week 2): Pt will complete all bathing and dressing with supervision sit to stand.   OT Short Term Goal 4 (Week 2): Pt will complete simple home management or meal prep tasks with supervision and min instructional cueing.   Skilled Therapeutic Interventions/Progress Updates:    Pt upset when this therapist entered the room.  Stating he was tired of having an alarm on his bed and wanted to go home that he was tired of having people watch him.  Pt complaining about the liquids being pudding thick as well and even though therapist re-iterated why they needed to be that thick, he just replied "I'm not gonna do this at home".  MD came in during visit as well and discussed needing at least one more day to work on things to get ready for home.  Pt agreed reluctantly to stay until tomorrow.  He completed eating breakfast and then agreed to get up with therapist after max encouragement.  He completed toilet transfer with distant supervision including washing his hands at the sink.  Had him ambulate in the hallway for the remainder of session while having to reach down to the floor to retrieve objects as well as complete head turn, and step over lines on the floor.  He needed supervision with some increased swaying noted but no LOB.  Had him attempt to walk heel to toe on the green line in the hallway.  He was unable to complete this at all and blamed it on his socks.  Pt still demonstrating poor awareness of balance issues, however it has  improved some since Friday.  Pt returned to room at end of session and left in recliner chair with call button in reach and safety belt in place.      Therapy Documentation Precautions:  Precautions Precautions: Fall Precaution Comments: impulsive Restrictions Weight Bearing Restrictions: No  Pain: Pain Assessment Pain Assessment: 0-10 Pain Score: 0-No pain ADL: See Function Navigator for Current Functional Status.   Therapy/Group: Individual Therapy  Mabrey Howland OTR/L 10/25/2015, 12:15 PM

## 2015-10-25 NOTE — Progress Notes (Signed)
Subjective/Complaints:  Pt c/o too many restrictions including need for Sup with ambulation to BR, also pudding  ROS- no bowel or bladder issues reported, cold last noc  Objective: Vital Signs: Blood pressure 145/69, pulse 87, temperature 98.4 F (36.9 C), temperature source Oral, resp. rate 18, height '5\' 7"'$  (1.702 m), weight 65.772 kg (145 lb), SpO2 100 %. No results found. Results for orders placed or performed during the hospital encounter of 10/15/15 (from the past 72 hour(s))  Glucose, capillary     Status: Abnormal   Collection Time: 10/22/15 11:41 AM  Result Value Ref Range   Glucose-Capillary 197 (H) 65 - 99 mg/dL  Glucose, capillary     Status: Abnormal   Collection Time: 10/22/15  8:48 PM  Result Value Ref Range   Glucose-Capillary 195 (H) 65 - 99 mg/dL   Comment 1 Notify RN   Renal function panel     Status: Abnormal   Collection Time: 10/23/15  6:00 AM  Result Value Ref Range   Sodium 137 135 - 145 mmol/L   Potassium 4.4 3.5 - 5.1 mmol/L   Chloride 106 101 - 111 mmol/L   CO2 24 22 - 32 mmol/L   Glucose, Bld 114 (H) 65 - 99 mg/dL   BUN 10 6 - 20 mg/dL   Creatinine, Ser 1.05 0.61 - 1.24 mg/dL   Calcium 8.2 (L) 8.9 - 10.3 mg/dL   Phosphorus 3.0 2.5 - 4.6 mg/dL   Albumin 2.5 (L) 3.5 - 5.0 g/dL   GFR calc non Af Amer >60 >60 mL/min   GFR calc Af Amer >60 >60 mL/min    Comment: (NOTE) The eGFR has been calculated using the CKD EPI equation. This calculation has not been validated in all clinical situations. eGFR's persistently <60 mL/min signify possible Chronic Kidney Disease.    Anion gap 7 5 - 15  Glucose, capillary     Status: Abnormal   Collection Time: 10/23/15  6:45 AM  Result Value Ref Range   Glucose-Capillary 152 (H) 65 - 99 mg/dL   Comment 1 Notify RN   Glucose, capillary     Status: Abnormal   Collection Time: 10/23/15 11:40 AM  Result Value Ref Range   Glucose-Capillary 216 (H) 65 - 99 mg/dL   Comment 1 Notify RN   Glucose, capillary     Status:  Abnormal   Collection Time: 10/23/15  4:44 PM  Result Value Ref Range   Glucose-Capillary 148 (H) 65 - 99 mg/dL   Comment 1 Notify RN   Glucose, capillary     Status: Abnormal   Collection Time: 10/23/15  8:35 PM  Result Value Ref Range   Glucose-Capillary 331 (H) 65 - 99 mg/dL   Comment 1 Notify RN   Glucose, capillary     Status: None   Collection Time: 10/24/15  6:36 AM  Result Value Ref Range   Glucose-Capillary 96 65 - 99 mg/dL   Comment 1 Notify RN   Glucose, capillary     Status: Abnormal   Collection Time: 10/24/15 12:02 PM  Result Value Ref Range   Glucose-Capillary 120 (H) 65 - 99 mg/dL   Comment 1 Notify RN   Glucose, capillary     Status: Abnormal   Collection Time: 10/24/15  4:31 PM  Result Value Ref Range   Glucose-Capillary 199 (H) 65 - 99 mg/dL   Comment 1 Notify RN       General: No acute distress Mood and affect are appropriate Heart: Regular rate  and rhythm no rubs murmurs or extra sounds Lungs: Clear to auscultation, breathing unlabored, no rales or wheezes Abdomen: Positive bowel sounds, soft nontender to palpation, nondistended Extremities: No clubbing, cyanosis, or edema Skin: No evidence of breakdown, no evidence of rash Neurologic: Cranial nerves II through XII intact, motor strength is 5/5 in bilateral deltoid, bicep, tricep, grip, hip flexor, knee extensors, ankle dorsiflexor and plantar flexor Sensory exam normal sensation to light touch and proprioception in bilateral upper and lower extremities  Cerebellar exam mild dysmetria RUE Musculoskeletal: Full range of motion in all 4 extremities. No joint swelling   Assessment/Plan: 1. Functional deficits secondary to Cognitive, mobility, and functional deficits secondary to anoxic encephalopathy and right frontal infarct which require 3+ hours per day of interdisciplinary therapy in a comprehensive inpatient rehab setting. Physiatrist is providing close team supervision and 24 hour management of  active medical problems listed below. Physiatrist and rehab team continue to assess barriers to discharge/monitor patient progress toward functional and medical goals.  Original D/C set for 4/5, ? Home 4/4  FIM: Function - Bathing Position: Shower Body parts bathed by patient: Right arm, Left arm, Chest, Abdomen, Front perineal area, Buttocks, Right upper leg, Left upper leg, Right lower leg, Left lower leg Body parts bathed by helper: Back Bathing not applicable: Back Assist Level: Supervision or verbal cues  Function- Upper Body Dressing/Undressing What is the patient wearing?: Pull over shirt/dress Pull over shirt/dress - Perfomed by patient: Thread/unthread right sleeve, Thread/unthread left sleeve, Put head through opening, Pull shirt over trunk Assist Level: Supervision or verbal cues Function - Lower Body Dressing/Undressing What is the patient wearing?: Socks, Pants Position: Wheelchair/chair at sink Underwear - Performed by patient: Thread/unthread right underwear leg, Thread/unthread left underwear leg, Pull underwear up/down Pants- Performed by patient: Pull pants up/down, Thread/unthread right pants leg, Thread/unthread left pants leg Non-skid slipper socks- Performed by helper: Don/doff right sock, Don/doff left sock Socks - Performed by patient: Don/doff right sock, Don/doff left sock Shoes - Performed by patient: Don/doff left shoe, Don/doff right shoe, Fasten left (no laces for right shoe) Assist for footwear: Dependant Assist for lower body dressing: Supervision or verbal cues  Function - Toileting Toileting activity did not occur:  (did not need to complete toileting task with OT) Toileting steps completed by patient: Adjust clothing prior to toileting, Performs perineal hygiene, Adjust clothing after toileting Toileting steps completed by helper: Adjust clothing after toileting Toileting Assistive Devices: Grab bar or rail Assist level: Supervision or verbal  cues  Function Midwife transfer assistive device: Grab bar Assist level to toilet: Touching or steadying assistance (Pt > 75%) Assist level from toilet: Touching or steadying assistance (Pt > 75%)  Function - Chair/bed transfer Chair/bed transfer method: Ambulatory Chair/bed transfer assist level: Touching or steadying assistance (Pt > 75%) Chair/bed transfer assistive device: Armrests Chair/bed transfer details: Verbal cues for precautions/safety, Tactile cues for posture  Function - Locomotion: Wheelchair Will patient use wheelchair at discharge?: No Type: Manual Wheelchair activity did not occur: Refused Max wheelchair distance: 120 Assist Level: Supervision or verbal cues Assist Level: Supervision or verbal cues Assist Level: Supervision or verbal cues Turns around,maneuvers to table,bed, and toilet,negotiates 3% grade,maneuvers on rugs and over doorsills: No Function - Locomotion: Ambulation Assistive device: Cane-quad Max distance: 150 Assist level: Touching or steadying assistance (Pt > 75%) Assist level: Touching or steadying assistance (Pt > 75%) Walk 50 feet with 2 turns activity did not occur: Refused Assist level: Touching or steadying assistance (  Pt > 75%) Walk 150 feet activity did not occur: Safety/medical concerns Assist level: Touching or steadying assistance (Pt > 75%) Walk 10 feet on uneven surfaces activity did not occur: Safety/medical concerns  Function - Comprehension Comprehension: Auditory Comprehension assist level: Follows basic conversation/direction with no assist  Function - Expression Expression: Verbal Expression assist level: Expresses basic needs/ideas: With no assist  Function - Social Interaction Social Interaction assist level: Interacts appropriately with others - No medications needed.  Function - Problem Solving Problem solving assist level: Solves basic problems with no assist  Function - Memory Memory assist  level: Recognizes or recalls 90% of the time/requires cueing < 10% of the time Patient normally able to recall (first 3 days only): Staff names and faces, That he or she is in a hospital, Current season  Medical Problem List and Plan: 1. Cognitive, mobility, and functional deficits secondary to anoxic encephalopathy and right frontal infarct-Sup Mobility, ask PT to assess whether pt can be Mod I in rm 2. DVT Prophylaxis/Anticoagulation: Pharmaceutical: Lovenox 3. Pain Management: tylenol prn, frontal HA x 2 am relieved by tylenol 4. Mood: LCSW to follow for evaluation and support.  5. Neuropsych: This patient is not fully capable of making decisions on his own behalf. 6. Skin/Wound Care: Routine pressure relief measure.  7. Fluids/Electrolytes/Nutrition: Monitor I/O.  8. Anterior STEMI: to continue ASA, Brilinta, BB and statin. 9. Acute systolic heart failure: Has been compensated. Check daily weights and monitor for signs of overload. Continue aldactone, coreg and lisinopril.  . 10. 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 Neurology recommends coumadin plus plavix in one month.  11. MSSA PNA:  No sign of recurrence -pt is an aspiration risk--observe aspiration precautions 12. Acute on chronic renal failure: Diuresis discontinued with improvement. Continue to monitor as on modified diet without liquids. May need IVF at nights to maintain adequate hydration.  13. Anemia: Slowly improving. Continue to monitor for signs of infection. Check CBC in am.  14. DM type 2: New diagnosis with Hgb A1C-6.9. BS uncontrolled due to tube feeds. Continue lantus daily and will transition to oral medication as intake improves. Monitor BS with ac/hs checks and use SSI for elevated BS. PM CBG elevated,AM CBG is good, increase am glucophage CBG (last 3)   Recent Labs  10/24/15 0636 10/24/15 1202 10/24/15 1631  GLUCAP 96 120* 199*    15.  HypoK add KCL , f/u BMET K is normal 4.8 16.  Dysphagia,  sensory def with silent asp upgrade to D3 but still pudding thick, ?repeat MBS prior to D/C LOS (Days) 10 A FACE TO FACE EVALUATION WAS PERFORMED  Shadee Rathod E 10/25/2015, 8:18 AM

## 2015-10-25 NOTE — Progress Notes (Signed)
Social Work Patient ID: Eddie [name], male   DOB: 07-15-1951, 65 y.o.   MRN: [id number] Met with pt and wife who had just been through Speech therapy, all went well according to Nicole-Sp and ready for discharge tomorrow per pt's preference. Wife is on board and pleased with the progress he has made. Both aware of his diet of pudding thick and ice chips only. Pt reports he will follow this at home.

## 2015-10-25 NOTE — Progress Notes (Signed)
Speech Language Pathology Discharge Summary  Patient Details  Name: Eddie [name] MRN: [id number] Date of Birth: Nov 30, 1950  Today's Date: 10/26/2015 SLP Individual Time: 0903-0930 SLP Individual Time Calculation (min): 27 min   Skilled Therapeutic Interventions:  Pt was seen for skilled ST targeting goals for cognition and dysphagia.  SLP facilitated the session with trials of ice chips following oral care to continue working towards liquids progression.  Pt required max assist question cues for recall of rules and parameters of the ice chip protocol from yesterday's therapy session; however, following re-education and repetition of information pt was able to recall precautions with min assist (oral hygiene, ice chips only, wait 30 minutes between meals).  Pt also required max assist verbal cues to recall that all other liquids needed to be thickened to pudding thick consistency due to deficits present on most recent MBS.  Wife educated yesterday but was not present to reinforce recommendations prior to discharge.  SLP reviewed risk of aspiration extensively with pt, but therapist continues to have reservations about pt's compliance with diet at home due to cognitive limitations and family not being present today for reinforcement.  SLP also reviewed compensatory memory strategies to facilitate improved recall of daily information.  Handout was provided to assist with carryover in the home environment.  SLP recommended that pt have assistance for medication and financial management at discharge; however, again question pt's compliance with recommendations given poor recall, decreased awareness of deficits, and no family present to confirm that he will have necessary level of support available to him at home.  Pt left in wheelchair with quick release belt donned for safety.  Recommend ongoing family education at next level of care.     Patient has met 11 of 11 long term goals.  Patient to discharge at  overall Min-mod level.  Reasons goals not met: n/a   Clinical Impression/Discharge Summary:  Pt made functional gains this reporting period and is discharging having met 11 out of 11 long term goals.  Pt is currently an overall min-mod assist for basic to semi-complex cognitive tasks due to decreased awareness of deficits, decreased working memory, and decreased functional problem solving.  Pt is consuming dys 3 textures and pudding thick liquids with mod I use of swallowing precautions.  Pt will need repeat objective swallow study prior to further advancement due to silent aspiration and penetration on most recent MBS (3/30).  Pt is discharging home with recommendations for 24/7 supervision in addition to [place] follow up at discharge.  Question whether pt will be compliant with diet or safety precautions at home given decreased awareness of deficits and poor recall of new information (see above).   Recommend ongoing pt and family education at next level of care due to the extent of pt's deficits and inconsistent family attendance in therapies while inpatient.      Care Partner:  Caregiver Able to Provide Assistance: Yes  Type of Caregiver Assistance: Physical;Cognitive  Recommendation:  Outpatient SLP;24 hour supervision/assistance  Rationale for SLP Follow Up: Maximize cognitive function and independence;Maximize swallowing safety   Equipment: thickener   Reasons for discharge: Discharged from hospital   Patient/Family Agrees with Progress Made and Goals Achieved: Yes   Function:  Eating Eating   Modified Consistency Diet: Yes Eating Assist Level: More than reasonable amount of time   Eating Set Up Assist For: Opening containers       Cognition Comprehension Comprehension assist level: Follows basic conversation/direction with no assist  Expression  Expression assist level: Expresses basic needs/ideas: With no assist  Social Interaction Social Interaction assist level: Interacts  appropriately 75 - 89% of the time - Needs redirection for appropriate language or to initiate interaction.  Problem Solving Problem solving assist level: Solves basic 75 - 89% of the time/requires cueing 10 - 24% of the time  Memory Memory assist level: Recognizes or recalls 75 - 89% of the time/requires cueing 10 - 24% of the time   Emilio Math 10/26/2015, 12:51 PM

## 2015-10-26 ENCOUNTER — Inpatient Hospital Stay (HOSPITAL_COMMUNITY): Payer: BLUE CROSS/BLUE SHIELD | Admitting: Physical Therapy

## 2015-10-26 ENCOUNTER — Encounter (HOSPITAL_COMMUNITY): Payer: BLUE CROSS/BLUE SHIELD | Admitting: Occupational Therapy

## 2015-10-26 ENCOUNTER — Inpatient Hospital Stay (HOSPITAL_COMMUNITY): Payer: BLUE CROSS/BLUE SHIELD | Admitting: Speech Pathology

## 2015-10-26 LAB — GLUCOSE, CAPILLARY
GLUCOSE-CAPILLARY: 109 mg/dL — AB (ref 65–99)
Glucose-Capillary: 119 mg/dL — ABNORMAL HIGH (ref 65–99)

## 2015-10-26 MED ORDER — SENNA 8.6 MG PO TABS: 2.0000 | ORAL_TABLET | Freq: Every day | ORAL | Status: DC

## 2015-10-26 MED ORDER — INSULIN PEN NEEDLE 31G X 5 MM MISC: 1.0000 "application " | Freq: Every day | Status: DC

## 2015-10-26 MED ORDER — LISINOPRIL 2.5 MG PO TABS: 2.5000 mg | ORAL_TABLET | Freq: Every day | ORAL | Status: DC

## 2015-10-26 MED ORDER — RESOURCE THICKENUP CLEAR PO POWD: 1.0000 g | ORAL | Status: DC | PRN

## 2015-10-26 MED ORDER — INSULIN GLARGINE 100 UNIT/ML SOLOSTAR PEN: 5.0000 [IU] | PEN_INJECTOR | Freq: Every day | SUBCUTANEOUS | Status: DC

## 2015-10-26 MED ORDER — METFORMIN HCL 850 MG PO TABS: 850.0000 mg | ORAL_TABLET | Freq: Every day | ORAL | Status: DC

## 2015-10-26 MED ORDER — TICAGRELOR 90 MG PO TABS: 90.0000 mg | ORAL_TABLET | Freq: Two times a day (BID) | ORAL | Status: DC

## 2015-10-26 MED ORDER — ATORVASTATIN CALCIUM 80 MG PO TABS: 80.0000 mg | ORAL_TABLET | Freq: Every day | ORAL | Status: DC

## 2015-10-26 MED ORDER — SPIRONOLACTONE 25 MG PO TABS: 12.5000 mg | ORAL_TABLET | Freq: Every day | ORAL | Status: DC

## 2015-10-26 MED ORDER — MUSCLE RUB 10-15 % EX CREA: 1.0000 "application " | TOPICAL_CREAM | CUTANEOUS | Status: DC | PRN

## 2015-10-26 MED ORDER — BLOOD GLUCOSE MONITOR KIT: PACK | Status: DC

## 2015-10-26 MED ORDER — ASPIRIN 81 MG PO CHEW: 81.0000 mg | CHEWABLE_TABLET | Freq: Every day | ORAL | Status: DC

## 2015-10-26 MED ORDER — INSULIN GLARGINE 100 UNIT/ML SOLOSTAR PEN: 5.0000 [IU] | PEN_INJECTOR | SUBCUTANEOUS | Status: DC

## 2015-10-26 MED ORDER — NITROGLYCERIN 0.4 MG SL SUBL: SUBLINGUAL_TABLET | SUBLINGUAL | Status: DC

## 2015-10-26 MED ORDER — CARVEDILOL 6.25 MG PO TABS: 6.2500 mg | ORAL_TABLET | Freq: Two times a day (BID) | ORAL | Status: DC

## 2015-10-26 NOTE — Progress Notes (Signed)
Occupational Therapy Discharge Summary  Patient Details  Name: Eddie [name] MRN: [id number] Date of Birth: Oct 18, 1950  Today's Date: 10/26/2015 OT Individual Time: 0800-0900 OT Individual Time Calculation (min): 60 min    Session Note:  Pt worked on bathing and dressing at the sink during session.  He declined shower secondary to wanting to wait until he goes home later today.  He was able to stand at the sink with modified independence and complete most of his bathing.  He sat down on the seat of the wheelchair for LB dressing tasks but all parts of bathing and dressing were completed at modified independent level.  He gathered up items in his room once he finished in preparation for discharge home.  Had him ambulate down to the tub/shower room for practice with transferring down in the bottom of the tub and then back up.  He could complete with supervision using the grab bars for support.  Returned to room at end of session.  Pt left in wheelchair at end of session.    Patient has met 10 of 10 long term goals due to improved activity tolerance, improved balance, postural control, functional use of  LEFT upper extremity, improved attention, improved awareness and improved coordination.  Patient to discharge at overall Supervision level.  Patient's care partner was not available for education to provide the necessary physical and cognitive assistance at discharge.  During earlier during Eddie [name] stay it was discussed that he would need 24 hour supervision at discharge.  His wife reported understanding of this but did not attend formal OT education.    Reasons goals not met: NA  Recommendation:  Patient will benefit from ongoing skilled OT services in outpatient setting to continue to advance functional skills in the area of BADL, iADL and Reduce care partner burden.  Eddie [name] is progressing toward modified independent level however he still needs supervision for alternating and divided attention as  well as safety awareness.  He continues to make improvements with his balance but still demonstrates decreased higher level balance deficits.  His awareness of these deficits is poor as he tends to blame his loss of balance on his lack of work shoes instead of it being affected from his stroke.  Feel he will need 24 hour supervision for safety at home as he has stated on multiple occasions he is not drinking or following the recommended diet and will do what he wants to once he gets home.    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) Wears Glasses: Reading only Patient Visual Report: No change from baseline Vision- Assessment Vision Assessment?: Yes Eye Alignment: Within Functional Limits Ocular Range of Motion: Within Functional Limits Alignment/Gaze Preference: Within Defined Limits Tracking/Visual Pursuits: Able to track stimulus in all quads without difficulty Visual Fields: Right visual field deficit  Cognition Overall Cognitive Status: Impaired/Different from baseline Arousal/Alertness: Awake/alert Orientation Level: Oriented X4 Attention: Selective Sustained Attention: Appears intact (during ADL tasks) Selective Attention: Appears intact Selective Attention Impairment: Verbal complex;Functional complex Memory: Impaired Memory Impairment: Decreased short term memory;Decreased recall of new information Decreased Short Term Memory: Functional basic;Functional  complex Awareness: Impaired Awareness Impairment: Anticipatory  impairment;Emergent impairment Problem Solving: Impaired Problem Solving Impairment: Functional complex;Functional basic Executive Function: Reasoning;Organizing;Decision Making Reasoning: Impaired Reasoning Impairment: Verbal basic;Functional basic Organizing: Impaired Organizing Impairment: Verbal basic;Functional basic Decision Making: Impaired Decision Making Impairment: Functional complex;Functional basic Behaviors: Impulsive;Verbal agitation Safety/Judgment: Impaired Comments: Pt with decreased awareness of his cognitive and physical deficits.  Tends to state that his higher level balance deficits are because he's wearing socks or slippers instead of his work shoes.   Sensation Sensation Light Touch: Appears Intact Stereognosis: Appears Intact Hot/Cold: Appears Intact Proprioception: Appears Intact Coordination Gross Motor Movements are Fluid and Coordinated: Yes Fine Motor Movements are Fluid and Coordinated: Yes Heel Shin Test: WFL BLE Motor  Motor Motor: Within Functional Limits Mobility  Bed Mobility Bed Mobility: Supine to Sit;Sit to Supine;Rolling Right;Rolling Left Rolling Right: 7: Independent Rolling Left: 7: Independent Supine to Sit: 7: Independent Sit to Supine: 7: Independent Transfers Transfers: Sit to Stand;Stand to Sit Sit to Stand: 7: Independent Stand to Sit: 7: Independent  Trunk/Postural Assessment  Cervical Assessment Cervical Assessment: Within Functional Limits Thoracic Assessment Thoracic Assessment: Within Functional Limits Lumbar Assessment Lumbar Assessment: Within Functional Limits Postural Control Postural Control: Deficits on evaluation Protective Responses: delayed/impaired  Balance Balance Balance Assessed: Yes Static Sitting Balance Static Sitting - Level of Assistance: 7: Independent Dynamic Sitting Balance Dynamic Sitting - Level of Assistance: 7: Independent Static Standing Balance Static Standing - Level of Assistance:  7: Independent Dynamic Standing Balance Dynamic Standing - Level of Assistance: 5: Stand by assistance Extremity/Trunk Assessment RUE Assessment RUE Assessment: Within Functional Limits LUE Assessment LUE Assessment: Within Functional Limits   See Function Navigator for Current Functional Status.  Kailer Heindel OTR/L 10/26/2015, 12:27 PM

## 2015-10-26 NOTE — Progress Notes (Signed)
Pt prepared for discharge and given all discharge instructions. Pt discharged to home with wife.

## 2015-10-26 NOTE — Discharge Instructions (Signed)
Inpatient Rehab Discharge Instructions  Eddie [name] Discharge date and time: 10/26/15    Activities/Precautions/ Functional Status: Activity: no lifting, driving, or strenuous exercise till cleared by MD.  Diet: cardiac diet and diabetic diet---soft foods. Liquids have to be thickened to pudding thick consistency.  May use ice chip after oral care.  Wound Care: keep wound clean and dry   Functional status:  ___ No restrictions     ___ Walk up steps independently ___ 24/7 supervision/assistance   ___ Walk up steps with assistance ___ Intermittent supervision/assistance  ___ Bathe/dress independently ___ Walk with walker     ___ Bathe/dress with assistance ___ Walk Independently    ___ Shower independently ___ Walk with assistance    ___ Shower with assistance _X__ No alcohol     ___ Return to work/school ________  Special Instructions: 1. Check blood sugars 2-3 times a day.     COMMUNITY REFERRALS UPON DISCHARGE:    Outpatient: PT & SP  Agency:[facility] Phone:[phone]   Date of Last Service:10/26/2015  Appointment Date/Time:APRIL 7 Friday @ 10;00-11:00 AM AND April 10 Monday 8:45-9:00 AM  Medical Equipment/Items Ordered:SBQC  Agency/Supplier:[facility]   [phone]   GENERAL COMMUNITY RESOURCES FOR PATIENT/FAMILY: Support Groups:[facility] SUPPORT [facility] SECOND Thursday @ 3:00-4:00 PM ON THE REHAB UNIT QUESTIONS CONTACT KATIE [phone]  My questions have been answered and I understand these instructions. I will adhere to these goals and the provided educational materials after my discharge from the hospital.  Patient/Caregiver Signature _______________________________ Date __________  Clinician Signature _______________________________________ Date __________  Please bring this form and your medication list with you to all your follow-up doctor's appointments.

## 2015-10-26 NOTE — Discharge Summary (Signed)
Physician Discharge Summary  Patient ID: Eddie [name] MRN: [id number] DOB/AGE: 03/17/1951 65 y.o.  Admit date: 10/15/2015 Discharge date: 10/26/2015  Discharge Diagnoses:  Principal Problem:   Ischemic stroke of frontal lobe [facility]) Active Problems:   Acute on chronic systolic congestive heart failure (HCC)   Dysphagia as late effect of cerebrovascular disease   Essential hypertension   Type 2 diabetes mellitus with complication (HCC)   Anoxic encephalopathy ([place])   Discharged Condition: stable.    Labs:  Basic Metabolic Panel:  Recent Labs Lab 10/21/15 0433 10/22/15 0530 10/23/15 0600 10/25/15 1231  NA 139  --  137 138  K 4.8  --  4.4 4.7  CL 107  --  106 104  CO2 22  --  24 25  GLUCOSE 121*  --  114* 77  BUN 14  --  10 12  CREATININE 1.03 0.96 1.05 1.10  CALCIUM 8.3*  --  8.2* 8.8*  PHOS 2.6  --  3.0  --     CBC:  Recent Labs Lab 10/25/15 1231  WBC 9.8  HGB 9.6*  HCT 31.2*  MCV 80.4  PLT 374    CBG:  Recent Labs Lab 10/25/15 0634 10/25/15 1121 10/25/15 1626 10/25/15 2134 10/26/15 0723  GLUCAP 94 73 119* 211* 109*    Brief HPI:   Eddie [name] is a 65 y.o. male who was admitted on 09/28/15 with chest pain with ST elevation due to anterior STEMI and developed unresponsiveness enroute to ED. He was intubated and treated with brief compressions and epi for PEA. He was taken to cath lab urgently and DES placed to LAD. 2D echo with EF 10-15%. He was treated with hypothermia protocol and during rewarming was noted to have agitation as well as left sided weakness on 03/13. 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 He developed copious oral secretions due to MSSA HCAP and was started on Vanc/Fortaz for treatment. Repeat echo with EF 30-35% with diffuse hypokinesis and no PFO or thrombus. EEG ordered due to  fluctuating MS and showed generalized background slowing due to "pharmacologic, hypoxic, toxic-metabolic or other diffuse physiologic etiology". Treated with ARDS protocol and tolerated extubation on 03/21.  Dr. Erlinda Hong felt that stroke possibly cardio-embolic. Patient to continue ASA/Brilinta for now and start warfarin/plavix in about a month. He was started on dysphagia I diet with pudding liquids.  Patient with resultant Left sided weakness, poor posture and cognitive deficits. CIR recommended for follow up therapy.    Hospital Course: Eddie [name] was admitted to rehab 10/15/2015 for inpatient therapies to consist of PT, ST and OT at least three hours five days a week. Past admission physiatrist, therapy team and rehab RN have worked together to provide customized collaborative inpatient rehab. Blood pressures have been stable and CHF has been compensated on aldactone, coreg and lisinopril.  No reports of chest pain or discomfort reported with increase in activity level. He was maintained on ASA and Brillinta during his rehab stay and has tolerated this without side effects. Follow up CBC showed stable H/H.  He developed significant hypokalemia requiring runs of K+.  K dur was increased to help with oral supplementation and potasium is stable at discharge. His diabetes has been monitored with ac/hs checks and metformin was resumed as renal status has been stable.  He continues on Lantus 5 units at bedtime and is to  continue this till follow up with primary MD.   His diet has slowly been advanced to dysphagia 3 but he continues on pudding liquids due to significant sensory based dysphagia.  He has been educated on importance of compliance to prevent aspiration PNA . He is allowed ICE CHIPS  ONLY after oral care.  He has been able to maintain adequate hydration with need for IVF and patient and wife have been educated on current diet.  Cognition has improved and he has made good gains during his rehab stay.  Supervision is recommended for safety due to impulsivity.   Rehab course: During patient's stay in rehab weekly team conferences were held to monitor patient's progress, set goals and discuss barriers to discharge. At admission, patient required moderate assist with mobility and min assist with basic self care tasks. He had significant cognitive deficits with MoCA score 8/30 and deficits in STM, basic problem solving as well as significant dysphagia. He has had improvement in activity tolerance, balance, postural control, as well as ability to compensate for deficits. He is has had improvement in functional use RUE/LUE  and RLE/LLE as well as improved awareness. He is able to complete ADL tasks with supervision. He requires supervision for transfers and is able to ambulate 150 feet with supervision to min guard assists with use of SBQC. He requires min to moderate assistance for basic and semi-complex cognitive tasks. He is able to utilize safe swallow strategies  independently. Family education was completed regarding safety, cognitive deficits as well as dysphagia. He will continue to receive follow up  Outpatient PT and ST at [facility] after discharge.   .    Disposition: 01-Home or Self Care  Diet: Cardiac/diabetic diet. Soft foods. Pudding liquids.   Special Instructions: 1. Check blood sugars 2-3 times a day.  2. No driving or strenuous activity till cleared by MD.  3. Needs supervision after discharge.     Medication List    TAKE these medications        aspirin 81 MG chewable tablet  Chew 1 tablet (81 mg total) by mouth daily.     atorvastatin 80 MG tablet  Commonly known as:  LIPITOR  Take 1 tablet (80 mg total) by mouth daily at 6 PM.     blood glucose meter kit and supplies Kit  Dispense based on patient and insurance preference. Use up to four times daily as directed. (FOR ICD-9 250.00, 250.01).     carvedilol 6.25 MG tablet  Commonly known as:  COREG   Take 1 tablet (6.25 mg total) by mouth 2 (two) times daily with a meal.     famotidine 10 MG chewable tablet  Commonly known as:  PEPCID AC  Chew 10 mg by mouth 2 (two) times daily.     Insulin Glargine 100 UNIT/ML Solostar Pen  Commonly known as:  LANTUS  Inject 5 Units into the skin daily after supper.     Insulin Pen Needle 31G X 5 MM Misc  Commonly known as:  EASY TOUCH PEN NEEDLES  1 application by Does not apply route daily at 10 pm.     lisinopril 2.5 MG tablet  Commonly known as:  PRINIVIL,ZESTRIL  Take 1 tablet (2.5 mg total) by mouth daily.     metFORMIN 850 MG tablet  Commonly known as:  GLUCOPHAGE  Take 1 tablet (850 mg total) by mouth daily with breakfast.     MUSCLE RUB 10-15 % Crea  Apply 1  application topically as needed for muscle pain.     nitroGLYCERIN 0.4 MG SL tablet  Commonly known as:  NITROSTAT  Use one pill every 5 minutes X 3 if needed for chest pain. Contact MD if pain does not improve     RESOURCE THICKENUP CLEAR Powd  Take 1 g by mouth as needed. Thicken liquids to pudding consistency     senna 8.6 MG Tabs tablet  Commonly known as:  SENOKOT  Take 2 tablets (17.2 mg total) by mouth at bedtime.     spironolactone 25 MG tablet  Commonly known as:  ALDACTONE  Take 0.5 tablets (12.5 mg total) by mouth daily.     ticagrelor 90 MG Tabs tablet  Commonly known as:  BRILINTA  Take 1 tablet (90 mg total) by mouth 2 (two) times daily.           Follow-up Information    Follow up with Philis Fendt, MD On 11/10/2015.   Specialty:  Internal Medicine   Why:  APPT @ 2:45 Be there @ 2:30 PM for paperwork   Contact information:   [place] [address] [phone]       Follow up with Charlett Blake, MD.   Specialty:  Physical Medicine and Rehabilitation   Why:  office to call  you with appointment.    Contact information:   [address] [place] [address] [phone]       Follow up with Xu,Jindong,  MD. Call today.   Specialty:  Neurology   Why:  for follow up appointment in 4-6 weeks.    Contact information:   [address] [place] [address] [phone]       Follow up with Quay Burow, MD On 10/28/2015.   Specialties:  Cardiology, Radiology   Why:  at 8 am/Appointment with Rosaria Ferries to discuss change in blood thinners.   Contact information:   [address] [place] [address] [phone]       Signed: Bary Leriche 10/26/2015, 12:12 PM

## 2015-10-26 NOTE — Progress Notes (Signed)
Subjective/Complaints:  No issues overnite  ROS- no bowel or bladder issues reported, cold last noc  Objective: Vital Signs: Blood pressure 138/56, pulse 88, temperature 98.9 F (37.2 C), temperature source Oral, resp. rate 18, height 5\' 7"  (1.702 m), weight 65 kg (143 lb 4.8 oz), SpO2 100 %. No results found. Results for orders placed or performed during the hospital encounter of 10/15/15 (from the past 72 hour(s))  Glucose, capillary     Status: Abnormal   Collection Time: 10/23/15 11:40 AM  Result Value Ref Range   Glucose-Capillary 216 (H) 65 - 99 mg/dL   Comment 1 Notify RN   Glucose, capillary     Status: Abnormal   Collection Time: 10/23/15  4:44 PM  Result Value Ref Range   Glucose-Capillary 148 (H) 65 - 99 mg/dL   Comment 1 Notify RN   Glucose, capillary     Status: Abnormal   Collection Time: 10/23/15  8:35 PM  Result Value Ref Range   Glucose-Capillary 331 (H) 65 - 99 mg/dL   Comment 1 Notify RN   Glucose, capillary     Status: None   Collection Time: 10/24/15  6:36 AM  Result Value Ref Range   Glucose-Capillary 96 65 - 99 mg/dL   Comment 1 Notify RN   Glucose, capillary     Status: Abnormal   Collection Time: 10/24/15 12:02 PM  Result Value Ref Range   Glucose-Capillary 120 (H) 65 - 99 mg/dL   Comment 1 Notify RN   Glucose, capillary     Status: Abnormal   Collection Time: 10/24/15  4:31 PM  Result Value Ref Range   Glucose-Capillary 199 (H) 65 - 99 mg/dL   Comment 1 Notify RN   Glucose, capillary     Status: Abnormal   Collection Time: 10/24/15  9:27 PM  Result Value Ref Range   Glucose-Capillary 277 (H) 65 - 99 mg/dL  Glucose, capillary     Status: None   Collection Time: 10/25/15  6:34 AM  Result Value Ref Range   Glucose-Capillary 94 65 - 99 mg/dL  Glucose, capillary     Status: None   Collection Time: 10/25/15 11:21 AM  Result Value Ref Range   Glucose-Capillary 73 65 - 99 mg/dL   Comment 1 Notify RN   Basic metabolic panel     Status: Abnormal    Collection Time: 10/25/15 12:31 PM  Result Value Ref Range   Sodium 138 135 - 145 mmol/L   Potassium 4.7 3.5 - 5.1 mmol/L   Chloride 104 101 - 111 mmol/L   CO2 25 22 - 32 mmol/L   Glucose, Bld 77 65 - 99 mg/dL   BUN 12 6 - 20 mg/dL   Creatinine, Ser 12/25/15 0.61 - 1.24 mg/dL   Calcium 8.8 (L) 8.9 - 10.3 mg/dL   GFR calc non Af Amer >60 >60 mL/min   GFR calc Af Amer >60 >60 mL/min    Comment: (NOTE) The eGFR has been calculated using the CKD EPI equation. This calculation has not been validated in all clinical situations. eGFR's persistently <60 mL/min signify possible Chronic Kidney Disease.    Anion gap 9 5 - 15  CBC     Status: Abnormal   Collection Time: 10/25/15 12:31 PM  Result Value Ref Range   WBC 9.8 4.0 - 10.5 K/uL   RBC 3.88 (L) 4.22 - 5.81 MIL/uL   Hemoglobin 9.6 (L) 13.0 - 17.0 g/dL   HCT 12/25/15 (L) 50.3 - 55.9 %  MCV 80.4 78.0 - 100.0 fL   MCH 24.7 (L) 26.0 - 34.0 pg   MCHC 30.8 30.0 - 36.0 g/dL   RDW 76.8 (H) 23.5 - 77.5 %   Platelets 374 150 - 400 K/uL  Glucose, capillary     Status: Abnormal   Collection Time: 10/25/15  9:34 PM  Result Value Ref Range   Glucose-Capillary 211 (H) 65 - 99 mg/dL  Glucose, capillary     Status: Abnormal   Collection Time: 10/26/15  7:23 AM  Result Value Ref Range   Glucose-Capillary 109 (H) 65 - 99 mg/dL      General: No acute distress Mood and affect are appropriate Heart: Regular rate and rhythm no rubs murmurs or extra sounds Lungs: Clear to auscultation, breathing unlabored, no rales or wheezes Abdomen: Positive bowel sounds, soft nontender to palpation, nondistended Extremities: No clubbing, cyanosis, or edema Skin: No evidence of breakdown, no evidence of rash Neurologic: Cranial nerves II through XII intact, motor strength is 5/5 in bilateral deltoid, bicep, tricep, grip, hip flexor, knee extensors, ankle dorsiflexor and plantar flexor Sensory exam normal sensation to light touch and proprioception in bilateral  upper and lower extremities  Cerebellar exam mild dysmetria RUE Musculoskeletal: Full range of motion in all 4 extremities. No joint swelling   Assessment/Plan: 1. Functional deficits secondary to Cognitive, mobility, and functional deficits secondary to anoxic encephalopathy and right frontal infarct Stable for D/C today F/u PCP in 3-4 weeks F/u PM&R 2 weeks See D/C summary See D/C instructions FIM: Function - Bathing Position: Shower Body parts bathed by patient: Right arm, Left arm, Chest, Abdomen, Front perineal area, Buttocks, Right upper leg, Left upper leg, Right lower leg, Left lower leg Body parts bathed by helper: Back Bathing not applicable: Back Assist Level: Supervision or verbal cues  Function- Upper Body Dressing/Undressing What is the patient wearing?: Pull over shirt/dress Pull over shirt/dress - Perfomed by patient: Thread/unthread right sleeve, Thread/unthread left sleeve, Put head through opening, Pull shirt over trunk Assist Level: Supervision or verbal cues Function - Lower Body Dressing/Undressing What is the patient wearing?: Socks, Pants Position: Wheelchair/chair at sink Underwear - Performed by patient: Thread/unthread right underwear leg, Thread/unthread left underwear leg, Pull underwear up/down Pants- Performed by patient: Pull pants up/down, Thread/unthread right pants leg, Thread/unthread left pants leg Non-skid slipper socks- Performed by helper: Don/doff right sock, Don/doff left sock Socks - Performed by patient: Don/doff right sock, Don/doff left sock Shoes - Performed by patient: Don/doff left shoe, Don/doff right shoe, Fasten left (no laces for right shoe) Assist for footwear: Dependant Assist for lower body dressing: Supervision or verbal cues  Function - Toileting Toileting activity did not occur:  (did not need to complete toileting task with OT) Toileting steps completed by patient: Adjust clothing prior to toileting, Performs perineal  hygiene, Adjust clothing after toileting Toileting steps completed by helper: Adjust clothing after toileting Toileting Assistive Devices: Grab bar or rail Assist level: Supervision or verbal cues  Function Programmer, multimedia transfer assistive device: Grab bar Assist level to toilet: Supervision or verbal cues Assist level from toilet: Supervision or verbal cues  Function - Chair/bed transfer Chair/bed transfer method: Ambulatory Chair/bed transfer assist level: Supervision or verbal cues Chair/bed transfer assistive device: Armrests Chair/bed transfer details: Verbal cues for precautions/safety, Tactile cues for posture  Function - Locomotion: Wheelchair Will patient use wheelchair at discharge?: No Type: Manual Wheelchair activity did not occur: Refused Max wheelchair distance: 120 Assist Level: Supervision or verbal cues  Assist Level: Supervision or verbal cues Assist Level: Supervision or verbal cues Turns around,maneuvers to table,bed, and toilet,negotiates 3% grade,maneuvers on rugs and over doorsills: No Function - Locomotion: Ambulation Assistive device: No device Max distance: 150 Assist level: Supervision or verbal cues Assist level: Touching or steadying assistance (Pt > 75%) Walk 50 feet with 2 turns activity did not occur: Refused Assist level: Supervision or verbal cues Walk 150 feet activity did not occur: Safety/medical concerns Assist level: Supervision or verbal cues Walk 10 feet on uneven surfaces activity did not occur: Safety/medical concerns Assist level: Supervision or verbal cues  Function - Comprehension Comprehension: Auditory Comprehension assist level: Follows basic conversation/direction with no assist  Function - Expression Expression: Verbal Expression assist level: Expresses basic needs/ideas: With no assist  Function - Social Interaction Social Interaction assist level: Interacts appropriately 75 - 89% of the time - Needs  redirection for appropriate language or to initiate interaction.  Function - Problem Solving Problem solving assist level: Solves basic 90% of the time/requires cueing < 10% of the time  Function - Memory Memory assist level: Recognizes or recalls 75 - 89% of the time/requires cueing 10 - 24% of the time Patient normally able to recall (first 3 days only): Staff names and faces, That he or she is in a hospital, Current season  Medical Problem List and Plan: 1. Cognitive, mobility, and functional deficits secondary to anoxic encephalopathy and right frontal infarct-Sup Mobility, ask PT to assess whether pt can be Mod I in rm 2. DVT Prophylaxis/Anticoagulation: Pharmaceutical: Lovenox 3. Pain Management: tylenol prn, frontal HA x 2 am relieved by tylenol 4. Mood: LCSW to follow for evaluation and support.  5. Neuropsych: This patient is not fully capable of making decisions on his own behalf. 6. Skin/Wound Care: Routine pressure relief measure.  7. Fluids/Electrolytes/Nutrition: Monitor I/O.  8. Anterior STEMI: to continue ASA, Brilinta, BB and statin. 9. Acute systolic heart failure: Has been compensated. Check daily weights and monitor for signs of overload. Continue aldactone, coreg and lisinopril.  . 10. 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 Neurology recommends coumadin plus plavix in one month.  11. MSSA PNA:  No sign of recurrence -pt is an aspiration risk--observe aspiration precautions 12. Acute on chronic renal failure: Diuresis discontinued with improvement. Continue to monitor as on modified diet without liquids. May need IVF at nights to maintain adequate hydration.  13. Anemia: Slowly improving. Continue to monitor for signs of infection. Check CBC in am.  14. DM type 2: New diagnosis with Hgb A1C-6.9. BS uncontrolled due to tube feeds. Continue lantus daily and will transition to oral medication as intake improves. Monitor BS with ac/hs checks and use SSI for elevated  BS. PM CBG elevated,AM CBG is good, increase am glucophage CBG (last 3)   Recent Labs  10/25/15 1121 10/25/15 2134 10/26/15 0723  GLUCAP 73 211* 109*    15.  HypoK add KCL , f/u BMET K is normal 4.7, eating well no diuretics will D/C 16.  Dysphagia, sensory def with silent asp upgrade to D3 but still pudding thick, LOS (Days) 11 A FACE TO FACE EVALUATION WAS PERFORMED  Adriel Desrosier E 10/26/2015, 7:55 AM

## 2015-10-26 NOTE — Progress Notes (Signed)
Physical Therapy Discharge Summary  Patient Details  Name: Eddie [name] MRN: [id number] Date of Birth: [birth year]  Today's Date: 10/26/2015 PT Individual Time: 1000-1025 PT Individual Time Calculation (min): 25 min    Patient has met 9 of 9 long term goals due to improved activity tolerance, improved balance, improved postural control, increased strength, decreased pain, ability to compensate for deficits, functional use of  left upper extremity and left lower extremity, improved attention, improved awareness and improved coordination.  Patient to discharge at an ambulatory level Supervision. Patient's care partner is independent to provide the necessary physical and cognitive assistance at discharge.  Reasons goals not met: NA  Recommendation:  Patient will benefit from ongoing skilled PT services in outpatient setting to continue to advance safe functional mobility, address ongoing impairments in balance, postural control, weakness, safety awareness, and minimize fall risk.  Equipment: quad cane  Reasons for discharge: treatment goals met and discharge from hospital  Patient/family agrees with progress made and goals achieved: Yes  PT Discharge Precautions/Restrictions Precautions Precautions: Fall Precaution Comments: impulsive Restrictions Weight Bearing Restrictions: No Pain Pain Assessment Pain Assessment: No/denies pain Vision/Perception  Defer to OT discharge Vision - Assessment Eye Alignment: Within Functional Limits Ocular Range of Motion: Within Functional Limits Alignment/Gaze Preference: Within Defined Limits Tracking/Visual Pursuits: Able to track stimulus in all quads without difficulty  Cognition Overall Cognitive Status: Impaired/Different from baseline Arousal/Alertness: Awake/alert Orientation Level: Oriented X4 Attention: Selective Selective Attention: Impaired Selective Attention Impairment: Verbal complex;Functional complex Memory: Impaired Memory  Impairment: Decreased recall of new information Awareness: Impaired Awareness Impairment: Anticipatory impairment Problem Solving: Impaired Problem Solving Impairment: Functional basic Executive Function: Reasoning;Organizing;Decision Making Reasoning: Impaired Reasoning Impairment: Verbal basic;Functional basic Organizing: Impaired Organizing Impairment: Verbal basic;Functional basic Decision Making: Impaired Decision Making Impairment: Functional basic Behaviors: Verbal agitation;Impulsive Safety/Judgment: Impaired Sensation Sensation Light Touch: Appears Intact Stereognosis: Appears Intact Hot/Cold: Appears Intact Proprioception: Appears Intact Coordination Gross Motor Movements are Fluid and Coordinated: Yes Fine Motor Movements are Fluid and Coordinated: Yes Heel Shin Test: WFL BLE Motor  Motor Motor: Within Functional Limits  Mobility Bed Mobility Bed Mobility: Supine to Sit;Sit to Supine;Rolling Right;Rolling Left Rolling Right: 7: Independent Rolling Left: 7: Independent Supine to Sit: 7: Independent Sit to Supine: 7: Independent Transfers Transfers: Yes Sit to Stand: 7: Independent Stand to Sit: 7: Independent Locomotion  Ambulation Ambulation: Yes Ambulation/Gait Assistance: 5: Supervision Ambulation Distance (Feet): 250 Feet Assistive device: None Gait Gait: Yes Gait Pattern: Impaired Gait Pattern:  (intermittent veering/scissoring) Gait velocity: 10 MWT = 0.77 m/s Stairs / Additional Locomotion Stairs: Yes Stairs Assistance: 5: Supervision Stair Management Technique: One rail Right;No rails;Alternating pattern;Step to pattern;Forwards Number of Stairs: 20 Height of Stairs: 6 Ramp: 5: Supervision Curb: 5: Supervision Wheelchair Mobility Wheelchair Mobility: No  Trunk/Postural Assessment  Cervical Assessment Cervical Assessment: Within Functional Limits Thoracic Assessment Thoracic Assessment: Within Functional Limits Lumbar Assessment Lumbar  Assessment: Within Functional Limits Postural Control Postural Control: Deficits on evaluation Protective Responses: delayed/impaired  Balance Balance Balance Assessed: Yes Standardized Balance Assessment Standardized Balance Assessment: Berg Balance Test Berg Balance Test Sit to Stand: Able to stand without using hands and stabilize independently Standing Unsupported: Able to stand safely 2 minutes Sitting with Back Unsupported but Feet Supported on Floor or Stool: Able to sit safely and securely 2 minutes Stand to Sit: Sits safely with minimal use of hands Transfers: Able to transfer safely, minor use of hands Standing Unsupported with Eyes Closed: Able to stand 10 seconds safely Standing Ubsupported with Feet Together:  Able to place feet together independently and stand 1 minute safely From Standing, Reach Forward with Outstretched Arm: Can reach confidently >25 cm (10") From Standing Position, Pick up Object from Floor: Able to pick up shoe safely and easily From Standing Position, Turn to Look Behind Over each Shoulder: Looks behind from both sides and weight shifts well Turn 360 Degrees: Able to turn 360 degrees safely in 4 seconds or less Standing Unsupported, Alternately Place Feet on Step/Stool: Able to complete >2 steps/needs minimal assist Standing Unsupported, One Foot in Front: Loses balance while stepping or standing Standing on One Leg: Tries to lift leg/unable to hold 3 seconds but remains standing independently Total Score: 46 Dynamic Standing Balance Dynamic Standing - Balance Support: During functional activity Dynamic Standing - Level of Assistance: 5: Stand by assistance Five times Sit to Stand Test (FTSS) Method: Use a straight back chair with a solid seat that is 16-18" high. Ask participant to sit on the chair with arms folded across their chest.   Instructions: "Stand up and sit down as quickly as possible 5 times, keeping your arms folded across your chest."    Measurement: Stop timing when the participant stands the 5th time.  TIME: ___13___ (in seconds)  Times > 13.6 seconds is associated with increased disability and morbidity (Guralnik, 2000) Times > 15 seconds is predictive of recurrent falls in healthy individuals aged 25 and older (Buatois, et al., 2008) Normal performance values in community dwelling individuals aged 18 and older (Bohannon, 2006): o 60-69 years: 11.4 seconds o 70-79 years: 12.6 seconds o 80-89 years: 14.8 seconds  MCID: ? 2.3 seconds for Vestibular Disorders (Meretta, 2006) Extremity Assessment  RUE Assessment RUE Assessment: Within Functional Limits LUE Assessment LUE Assessment: Within Functional Limits RLE Assessment RLE Assessment: Within Functional Limits (5/5 throughout) LLE Assessment LLE Assessment: Within Functional Limits (grossly 4+/5)   See Function Navigator for Current Functional Status.  Carney Living A 10/26/2015, 10:20 AM

## 2015-10-26 NOTE — Progress Notes (Signed)
Social Work  Discharge Note  The overall goal for the admission was met for:   Discharge location: Yes-HOME WITH WIFE WHO CAN PROVIDE 24 HR SUPERVISION UNTIL [place]  Length of Stay: Yes-11 DAYS  Discharge activity level: Yes-SUPERVISION LEVEL  Home/community participation: Yes  Services provided included: MD, RD, PT, OT, SLP, RN, CM, TR, Pharmacy and SW  Financial Services: Private Insurance: [employer]  Follow-up services arranged: Outpatient: [facility]PT & SP-4/7 @ 10:15-11:00 AND 4/10 8:45-9:00, DME: [facility] and Patient/Family has no preference for HH/DME agencies  Comments (or additional information):WIFE DID COME YESTERDAY FOR SPEECH EDUCATION BOTH AWARE HE NEEDS TO CONTINUE TO BE ON PUDDING THICK LIQUIDS. IF HE CHOOSES NOT TOO BOTH ARE AWARE OF THE RISK FACTORS-IE ASPIRATION PNEUMONIA, ETC  Patient/Family verbalized understanding of follow-up arrangements: Yes  Individual responsible for coordination of the follow-up plan: [name]-WIFE  Confirmed correct DME delivered: Elease Hashimoto 10/26/2015    Elease Hashimoto

## 2015-10-27 ENCOUNTER — Ambulatory Visit: Payer: BLUE CROSS/BLUE SHIELD | Admitting: Physical Therapy

## 2015-10-28 ENCOUNTER — Ambulatory Visit (INDEPENDENT_AMBULATORY_CARE_PROVIDER_SITE_OTHER): Payer: BLUE CROSS/BLUE SHIELD | Admitting: Physician Assistant

## 2015-10-28 ENCOUNTER — Telehealth: Payer: Self-pay

## 2015-10-28 ENCOUNTER — Encounter: Payer: Self-pay | Admitting: Physician Assistant

## 2015-10-28 VITALS — BP 160/90 | HR 82 | Ht 67.0 in | Wt 154.0 lb

## 2015-10-28 DIAGNOSIS — I6309 Cerebral infarction due to thrombosis of other precerebral artery: Secondary | ICD-10-CM | POA: Diagnosis not present

## 2015-10-28 DIAGNOSIS — I213 ST elevation (STEMI) myocardial infarction of unspecified site: Secondary | ICD-10-CM | POA: Diagnosis not present

## 2015-10-28 DIAGNOSIS — I10 Essential (primary) hypertension: Secondary | ICD-10-CM

## 2015-10-28 DIAGNOSIS — I469 Cardiac arrest, cause unspecified: Secondary | ICD-10-CM

## 2015-10-28 MED ORDER — CLOPIDOGREL BISULFATE 75 MG PO TABS: ORAL_TABLET | ORAL | Status: DC

## 2015-10-28 MED ORDER — WARFARIN SODIUM 5 MG PO TABS: ORAL_TABLET | ORAL | Status: DC

## 2015-10-28 NOTE — Progress Notes (Signed)
  Cardiology Office Note   Date:  10/28/2015   ID:  Eddie [name], DOB 03/04/1951, MRN [id number]  PCP:  No primary care provider on file.  Cardiologist:  Dr Hochrein  Eddie [name], [name], PA-C   Chief Complaint  Patient presents with  . Follow-up    Rt hand numb sometimes.     History of Present Illness: Eddie [name] is a 64 y.o. male with a history of PUD.  Admitted 03/07 w/ STEMI, s/p DES LAD. Post-cath, had CVA>>inpt rehab, d/c 10/26/2015. Other issues dx this admit include S-CHF, DM, HTN. Patient had a relook cath on 03/17, stent was patent with medical therapy for OM1 80% and RCA 65%.  Eddie [name] presents for Post hospital follow-up.  Discharge from rehabilitation, Eddie [name] has done very well. He was able to get all of his medications except for the blood glucose meter and insulin pen. He also is not on Brilinta at this time. His wife helps manage his medications and they may have accidentally not picked up the Brilinta at the pharmacy.  The glucose meter, insulin pen and possibly the Brilinta were included in a package of medications that were going to cost over $300 and they could not afford it.  He has not had chest pain or shortness of breath. He has not had lower extremity edema, orthopnea or PND. He states that he is starting to feel much better and feels that he is getting stronger. His wife states that they will be compliant with the exercises given to them at rehabilitation and they believe he can continue to increase his strength as an outpatient.  They do not have a home blood pressure cuff and he does not have a way to check his blood pressure. He has not taken his home blood pressure medications this morning because he has not eaten yet due to the early appointment. He has not been weighing because the scale are old.   Past Medical History  Diagnosis Date  . PUD (peptic ulcer disease)   . CAD (coronary artery disease)     a. STEMI 09/2015 w/ DES to Prox LAD    . CVA (cerebral infarction)     a. 09/2015: acute small right frontal lobe infarct    Past Surgical History  Procedure Laterality Date  . Repair of peptic ulcer    . Cardiac catheterization N/A 09/28/2015    Procedure: Left Heart Cath and Coronary Angiography;  Surgeon: Jonathan J Berry, MD;  Location: [facility];  Service: Cardiovascular;  Laterality: N/A;  . Cardiac catheterization N/A 10/08/2015    Procedure: Left Heart Cath and Coronary Angiography;  Surgeon: Henry W Smith, MD;  Location: [facility];  Service: Cardiovascular;  Laterality: N/A;    Current Outpatient Prescriptions  Medication Sig Dispense Refill  . aspirin 81 MG chewable tablet Chew 1 tablet (81 mg total) by mouth daily. 100 tablet 0  . atorvastatin (LIPITOR) 80 MG tablet Take 1 tablet (80 mg total) by mouth daily at 6 PM. 30 tablet 0  . blood glucose meter kit and supplies KIT Dispense based on patient and insurance preference. Use up to four times daily as directed. (FOR ICD-9 250.00, 250.01). 1 each 0  . carvedilol (COREG) 6.25 MG tablet Take 1 tablet (6.25 mg total) by mouth 2 (two) times daily with a meal. 60 tablet 0  . famotidine (PEPCID AC) 10 MG chewable tablet Chew 10 mg by mouth 2 (two) times daily.    .   Insulin Glargine (LANTUS) 100 UNIT/ML Solostar Pen Inject 5 Units into the skin daily after supper. 15 mL 0  . Insulin Pen Needle (EASY TOUCH PEN NEEDLES) 31G X 5 MM MISC 1 application by Does not apply route daily at 10 pm. 30 each 0  . lisinopril (PRINIVIL,ZESTRIL) 2.5 MG tablet Take 1 tablet (2.5 mg total) by mouth daily. 30 tablet 0  . Maltodextrin-Xanthan Gum (RESOURCE THICKENUP CLEAR) POWD Take 1 g by mouth as needed. Thicken liquids to pudding consistency 3 Can 1  . Menthol-Methyl Salicylate (MUSCLE RUB) 10-15 % CREA Apply 1 application topically as needed for muscle pain. 113 g 0  . metFORMIN (GLUCOPHAGE) 850 MG tablet Take 1 tablet (850 mg total) by mouth daily with breakfast. 30 tablet 0   . nitroGLYCERIN (NITROSTAT) 0.4 MG SL tablet Use one pill every 5 minutes X 3 if needed for chest pain. Contact MD if pain does not improve 30 tablet 0  . senna (SENOKOT) 8.6 MG TABS tablet Take 2 tablets (17.2 mg total) by mouth at bedtime. 120 each 0  . spironolactone (ALDACTONE) 25 MG tablet Take 0.5 tablets (12.5 mg total) by mouth daily. 30 tablet 1  . ticagrelor (BRILINTA) 90 MG TABS tablet Take 1 tablet (90 mg total) by mouth 2 (two) times daily. 60 tablet 0   No current facility-administered medications for this visit.    Allergies:   Review of patient's allergies indicates no known allergies.    Social History:  The patient  reports that he has been smoking.  He does not have any smokeless tobacco history on file.   Family History:  The patient's family history is not on file.    ROS:  Please see the history of present illness. All other systems are reviewed and negative.    PHYSICAL EXAM: VS:  BP 160/90 mmHg  Pulse 82  Ht 5' 7" (1.702 m)  Wt 154 lb (69.854 kg)  BMI 24.11 kg/m2 , BMI Body mass index is 24.11 kg/(m^2). GEN: Well nourished, well developed, male in no acute distress HEENT: normal for age, dentition very poor Neck: no JVD, no carotid bruit, no masses Cardiac: RRR; minimal murmur, no rubs, or gallops Respiratory:  clear to auscultation bilaterally, normal work of breathing GI: soft, nontender, nondistended, + BS MS: no deformity or atrophy; no edema; distal pulses are 2+ in all 4 extremities  Skin: warm and dry, no rash Neuro:  Strength and sensation are intact Psych: euthymic mood, full affect   EKG:  EKG is ordered today. The ekg ordered today demonstrates sinus rhythm with a rate of 82. He has residual ST elevation in leads V2 through V5, consistent with previous ECGs.   Recent Labs: 10/05/2015: TSH 0.196* 10/14/2015: Magnesium 2.7* 10/18/2015: ALT 8* 10/25/2015: BUN 12; Creatinine, Ser 1.10; Hemoglobin 9.6*; Platelets 374; Potassium 4.7; Sodium 138     Lipid Panel    Component Value Date/Time   CHOL 136 10/05/2015 1522   TRIG 89 10/12/2015 0345   HDL 19* 10/05/2015 1522   CHOLHDL 7.2 10/05/2015 1522   VLDL 18 10/05/2015 1522   LDLCALC 99 10/05/2015 1522     Wt Readings from Last 3 Encounters:  10/28/15 154 lb (69.854 kg)  10/26/15 143 lb 4.8 oz (65 kg)  10/15/15 127 lb (57.607 kg)     Other studies Reviewed: Additional studies/ records that were reviewed today include: Hospital records, rehabilitation notes and test results.  ASSESSMENT AND PLAN:  1.  Recent anterior STEMI: Mr. Stocks  did not get his Brilinta filled at discharge and he has not been taking it since 04/04. He is having no chest pain or shortness of breath.  2. CVA: The CVA was felt to be cardioembolic by the neurologist. Plans have been made (while he was in the hospital) to start Coumadin as an outpatient.  3. Anticoagulation: He was discharged on aspirin and Brilinta, but did not get the Brilinta filled. The plan was to change him from Brilinta to Plavix after 30 days and start him on Coumadin because of the CVA.  We will do this today. Dr. Croitoru suggested a 300 mg loading dose of Plavix which has been ordered. A Coumadin appointment will be made for next week, with an RN check for blood pressure and lab work.   Current medicines are reviewed at length with the patient today.  The patient does not have concerns regarding medicines.  The following changes have been made:  DC Brilinta, start Plavix with a load, start Coumadin  Labs/ tests ordered today include:   Orders Placed This Encounter  Procedures  . Basic Metabolic Panel (BMET)  . Lipid panel  . Hepatic function panel  . EKG 12-Lead     Disposition:   FU with Dr Hochrein  Signed, Eddie [name], [name], PA-C  10/28/2015 10:00 AM    [facility] Phone: [phone]; Fax: [phone]  This note was written with the assistance of speech recognition software. Please  excuse any transcriptional errors.   

## 2015-10-28 NOTE — Telephone Encounter (Signed)
1. Are you/is patient experiencing any problems since coming home? Are there any questions regarding any aspect of care? 2. Are there any questions regarding medications administration/dosing? Are meds being taken as prescribed? Patient should review meds with caller to confirm 3. Have there been any falls? 4. Has Home Health been to the house and/or have they contacted you? If not, have you tried to contact them? Can we help you contact them? 5. Are bowels and bladder emptying properly? Are there any unexpected incontinence issues? If applicable, is patient following bowel/bladder programs? 6. Any fevers, problems with breathing, unexpected pain? 7. Are there any skin problems or new areas of breakdown? 8. Has the patient/family member arranged specialty MD follow up (ie cardiology/neurology/renal/surgical/etc)?  Can we help arrange? 9. Does the patient need any other services or support that we can help arrange? 10. Are caregivers following through as expected in assisting the patient? 11. Has the patient quit smoking, drinking alcohol, or using drugs as recommended?  

## 2015-10-28 NOTE — Patient Instructions (Addendum)
Medication Instructions:  Your physician has recommended you make the following change in your medication:  1.  STOP the Brilinta 2.  START the Plavix 75 mg taking 4 tablets TODAY ONLY then 1 tablet daily after that 3.  START the Coumadin 5 mg taking 1 tablet daily until you see [facility] on 11/01/15 then take as directed  The Blood Glucose Monitoring Machine is at [facility]:  The name of it is Relion and it can be bought over the counter and the strips also   Labwork: Monday 11/01/15 BMET 3 MONTHS: (AT O/V WITH DR. HOCHREIN) FASTING LIPID & HEPATIC  Testing/Procedures: None ordered  Follow-Up: Your physician recommends that you schedule a follow-up appointment in: 11/01/15 WITH COU[facility]ADIN CLINIC  11/01/15 FOR A NURSE VISIT TO RECHECK BLOOD PRESSURE AND WILL GET BMET AT THE SAME TIME 3 MONTHS WITH DR. HOCHREIN   Any Other Special Instructions Will Be Listed Below (If Applicable).  Your physician recommends that you weigh, daily, at the same time every day, and in the same amount of clothing. Please record your daily weights on the handout provided and bring it to your next appointment.   Low-Sodium Eating Plan Sodium raises blood pressure and causes water to be held in the body. Getting less sodium from food will help lower your blood pressure, reduce any swelling, and protect your heart, liver, and kidneys. We get sodium by adding salt (sodium chloride) to food. Most of our sodium comes from canned, boxed, and frozen foods. Restaurant foods, fast foods, and pizza are also very high in sodium. Even if you take medicine to lower your blood pressure or to reduce fluid in your body, getting less sodium from your food is important. WHAT IS MY PLAN? Most people should limit their sodium intake to 2,300 mg a day. Your health care provider recommends that you limit your sodium intake to 2 GRAMS Diabetes Mellitus and Food It is important for you to manage your blood sugar (glucose) level. Your  blood glucose level can be greatly affected by what you eat. Eating healthier foods in the appropriate amounts throughout the day at about the same time each day will help you control your blood glucose level. It can also help slow or prevent worsening of your diabetes mellitus. Healthy eating may even help you improve the level of your blood pressure and reach or maintain a healthy weight.  General recommendations for healthful eating and cooking habits include: Eating meals and snacks regularly. Avoid going long periods of time without eating to lose weight. Eating a diet that consists mainly of plant-based foods, such as fruits, vegetables, nuts, legumes, and whole grains. Using low-heat cooking methods, such as baking, instead of high-heat cooking methods, such as deep frying. Work with your dietitian to make sure you understand how to use the Nutrition Facts information on food labels. HOW CAN FOOD AFFECT ME? Carbohydrates Carbohydrates affect your blood glucose level more than any other type of food. Your dietitian will help you determine how many carbohydrates to eat at each meal and teach you how to count carbohydrates. Counting carbohydrates is important to keep your blood glucose at a healthy level, especially if you are using insulin or taking certain medicines for diabetes mellitus. Alcohol Alcohol can cause sudden decreases in blood glucose (hypoglycemia), especially if you use insulin or take certain medicines for diabetes mellitus. Hypoglycemia can be a life-threatening condition. Symptoms of hypoglycemia (sleepiness, dizziness, and disorientation) are similar to symptoms of having too much alcohol.  If your health care provider has given you approval to drink alcohol, do so in moderation and use the following guidelines: Women should not have more than one drink per day, and men should not have more than two drinks per day. One drink is equal to: 12 oz of beer. 5 oz of wine. 1 oz of  hard liquor. Do not drink on an empty stomach. Keep yourself hydrated. Have water, diet soda, or unsweetened iced tea. Regular soda, juice, and other mixers might contain a lot of carbohydrates and should be counted. WHAT FOODS ARE NOT RECOMMENDED? As you make food choices, it is important to remember that all foods are not the same. Some foods have fewer nutrients per serving than other foods, even though they might have the same number of calories or carbohydrates. It is difficult to get your body what it needs when you eat foods with fewer nutrients. Examples of foods that you should avoid that are high in calories and carbohydrates but low in nutrients include: Trans fats (most processed foods list trans fats on the Nutrition Facts label). Regular soda. Juice. Candy. Sweets, such as cake, pie, doughnuts, and cookies. Fried foods. WHAT FOODS CAN I EAT? Eat nutrient-rich foods, which will nourish your body and keep you healthy. The food you should eat also will depend on several factors, including: The calories you need. The medicines you take. Your weight. Your blood glucose level. Your blood pressure level. Your cholesterol level. You should eat a variety of foods, including: Protein. Lean cuts of meat. Proteins low in saturated fats, such as fish, egg whites, and beans. Avoid processed meats. Fruits and vegetables. Fruits and vegetables that may help control blood glucose levels, such as apples, mangoes, and yams. Dairy products. Choose fat-free or low-fat dairy products, such as milk, yogurt, and cheese. Grains, bread, pasta, and rice. Choose whole grain products, such as multigrain bread, whole oats, and brown rice. These foods may help control blood pressure. Fats. Foods containing healthful fats, such as nuts, avocado, olive oil, canola oil, and fish. DOES EVERYONE WITH DIABETES MELLITUS HAVE THE SAME MEAL PLAN? Because every person with diabetes mellitus is different, there  is not one meal plan that works for everyone. It is very important that you meet with a dietitian who will help you create a meal plan that is just right for you.   This information is not intended to replace advice given to you by your health care provider. Make sure you discuss any questions you have with your health care provider.   Document Released: 04/06/2005 Document Revised: 07/31/2014 Document Reviewed: 06/06/2013 Elsevier Interactive Patient Education [employer] a day.  WHAT DO I NEED TO KNOW ABOUT THIS EATING PLAN? For the low-sodium eating plan, you will follow these general guidelines:  Choose foods with a % Daily Value for sodium of less than 5% (as listed on the food label).   Use salt-free seasonings or herbs instead of table salt or sea salt.   Check with your health care provider or pharmacist before using salt substitutes.   Eat fresh foods.  Eat more vegetables and fruits.  Limit canned vegetables. If you do use them, rinse them well to decrease the sodium.   Limit cheese to 1 oz (28 g) per day.   Eat lower-sodium products, often labeled as "lower sodium" or "no salt added."  Avoid foods that contain monosodium glutamate (MSG). MSG is sometimes added to [place] food and some canned foods.  Check  food labels (Nutrition Facts labels) on foods to learn how much sodium is in one serving.  Eat more home-cooked food and less restaurant, buffet, and fast food.  When eating at a restaurant, ask that your food be prepared with less salt, or no salt if possible.  HOW DO I READ FOOD LABELS FOR SODIUM INFORMATION? The Nutrition Facts label lists the amount of sodium in one serving of the food. If you eat more than one serving, you must multiply the listed amount of sodium by the number of servings. Food labels may also identify foods as:  Sodium free--Less than 5 mg in a serving.  Very low sodium--35 mg or less in a serving.  Low sodium--140 mg or  less in a serving.  Light in sodium--50% less sodium in a serving. For example, if a food that usually has 300 mg of sodium is changed to become light in sodium, it will have 150 mg of sodium.  Reduced sodium--25% less sodium in a serving. For example, if a food that usually has 400 mg of sodium is changed to reduced sodium, it will have 300 mg of sodium. WHAT FOODS CAN I EAT? Grains Low-sodium cereals, including oats, puffed wheat and rice, and shredded wheat cereals. Low-sodium crackers. Unsalted rice and pasta. Lower-sodium bread.  Vegetables Frozen or fresh vegetables. Low-sodium or reduced-sodium canned vegetables. Low-sodium or reduced-sodium tomato sauce and paste. Low-sodium or reduced-sodium tomato and vegetable juices.  Fruits Fresh, frozen, and canned fruit. Fruit juice.  Meat and Other Protein Products Low-sodium canned tuna and salmon. Fresh or frozen meat, poultry, seafood, and fish. Lamb. Unsalted nuts. Dried beans, peas, and lentils without added salt. Unsalted canned beans. Homemade soups without salt. Eggs.  Dairy Milk. Soy milk. Ricotta cheese. Low-sodium or reduced-sodium cheeses. Yogurt.  Condiments Fresh and dried herbs and spices. Salt-free seasonings. Onion and garlic powders. Low-sodium varieties of mustard and ketchup. Fresh or refrigerated horseradish. Lemon juice.  Fats and Oils Reduced-sodium salad dressings. Unsalted butter.  Other Unsalted popcorn and pretzels.  The items listed above may not be a complete list of recommended foods or beverages. Contact your dietitian for more options. WHAT FOODS ARE NOT RECOMMENDED? Grains Instant hot cereals. Bread stuffing, pancake, and biscuit mixes. Croutons. Seasoned rice or pasta mixes. Noodle soup cups. Boxed or frozen macaroni and cheese. Self-rising flour. Regular salted crackers. Vegetables Regular canned vegetables. Regular canned tomato sauce and paste. Regular tomato and vegetable juices. Frozen  vegetables in sauces. Salted [place] fries. Olives. Rosita Fire. Relishes. Sauerkraut. Salsa. Meat and Other Protein Products Salted, canned, smoked, spiced, or pickled meats, seafood, or fish. Bacon, ham, sausage, hot dogs, corned beef, chipped beef, and packaged luncheon meats. Salt pork. Jerky. Pickled herring. Anchovies, regular canned tuna, and sardines. Salted nuts. Dairy Processed cheese and cheese spreads. Cheese curds. Blue cheese and cottage cheese. Buttermilk.  Condiments Onion and garlic salt, seasoned salt, table salt, and sea salt. Canned and packaged gravies. Worcestershire sauce. Tartar sauce. Barbecue sauce. Teriyaki sauce. Soy sauce, including reduced sodium. Steak sauce. Fish sauce. Oyster sauce. Cocktail sauce. Horseradish that you find on the shelf. Regular ketchup and mustard. Meat flavorings and tenderizers. Bouillon cubes. Hot sauce. Tabasco sauce. Marinades. Taco seasonings. Relishes. Fats and Oils Regular salad dressings. Salted butter. Margarine. Ghee. Bacon fat.  Other Potato and tortilla chips. Corn chips and puffs. Salted popcorn and pretzels. Canned or dried soups. Pizza. Frozen entrees and pot pies.  The items listed above may not be a complete list of foods and  beverages to avoid. Contact your dietitian for more information.   This information is not intended to replace advice given to you by your health care provider. Make sure you discuss any questions you have with your health care provider.   Document Released: 12/30/2001 Document Revised: 07/31/2014 Document Reviewed: 05/14/2013 Elsevier Interactive Patient Education [employer]    If you need a refill on your cardiac medications before your next appointment, please call your pharmacy.

## 2015-10-29 ENCOUNTER — Ambulatory Visit: Payer: BLUE CROSS/BLUE SHIELD | Attending: Physical Medicine & Rehabilitation | Admitting: Physical Therapy

## 2015-10-29 DIAGNOSIS — R1313 Dysphagia, pharyngeal phase: Secondary | ICD-10-CM | POA: Insufficient documentation

## 2015-10-29 DIAGNOSIS — R498 Other voice and resonance disorders: Secondary | ICD-10-CM | POA: Insufficient documentation

## 2015-10-29 DIAGNOSIS — R2689 Other abnormalities of gait and mobility: Secondary | ICD-10-CM | POA: Insufficient documentation

## 2015-10-29 DIAGNOSIS — R41841 Cognitive communication deficit: Secondary | ICD-10-CM | POA: Insufficient documentation

## 2015-10-29 DIAGNOSIS — I69354 Hemiplegia and hemiparesis following cerebral infarction affecting left non-dominant side: Secondary | ICD-10-CM | POA: Insufficient documentation

## 2015-11-01 ENCOUNTER — Ambulatory Visit (INDEPENDENT_AMBULATORY_CARE_PROVIDER_SITE_OTHER): Payer: BLUE CROSS/BLUE SHIELD | Admitting: Pharmacist Clinician (PhC)/ Clinical Pharmacy Specialist

## 2015-11-01 ENCOUNTER — Ambulatory Visit: Payer: BLUE CROSS/BLUE SHIELD

## 2015-11-01 VITALS — BP 152/88 | HR 88

## 2015-11-01 DIAGNOSIS — R41841 Cognitive communication deficit: Secondary | ICD-10-CM

## 2015-11-01 DIAGNOSIS — Z7901 Long term (current) use of anticoagulants: Secondary | ICD-10-CM | POA: Insufficient documentation

## 2015-11-01 DIAGNOSIS — R498 Other voice and resonance disorders: Secondary | ICD-10-CM | POA: Diagnosis present

## 2015-11-01 DIAGNOSIS — R2689 Other abnormalities of gait and mobility: Secondary | ICD-10-CM | POA: Diagnosis not present

## 2015-11-01 DIAGNOSIS — I69354 Hemiplegia and hemiparesis following cerebral infarction affecting left non-dominant side: Secondary | ICD-10-CM | POA: Diagnosis present

## 2015-11-01 DIAGNOSIS — I10 Essential (primary) hypertension: Secondary | ICD-10-CM

## 2015-11-01 DIAGNOSIS — R1313 Dysphagia, pharyngeal phase: Secondary | ICD-10-CM | POA: Diagnosis present

## 2015-11-01 DIAGNOSIS — I63311 Cerebral infarction due to thrombosis of right middle cerebral artery: Secondary | ICD-10-CM

## 2015-11-01 LAB — POCT INR: INR: 1.5

## 2015-11-01 NOTE — Therapy (Signed)
[facility] [address] [place], [place], [address] Phone: [phone]   Fax:  [phone]  Speech Language Pathology Evaluation  Patient Details  Name: Eddie [name] MRN: [id number] Date of Birth: 1951-05-07 Referring Provider: Claudette Laws, MD  Encounter Date: 11/01/2015      End of Session - 11/01/15 0956    Visit Number 1   Number of Visits 16   Authorization - Visit Number 1   Authorization - Number of Visits 30   SLP Start Time 0849   SLP Stop Time  0931   SLP Time Calculation (min) 42 min   Activity Tolerance Patient tolerated treatment well      Past Medical History  Diagnosis Date  . PUD (peptic ulcer disease)   . CAD (coronary artery disease)     a. STEMI 09/2015 w/ DES to Prox LAD  . CVA (cerebral infarction)     a. 09/2015: acute small right frontal lobe infarct    Past Surgical History  Procedure Laterality Date  . Repair of peptic ulcer    . Cardiac catheterization N/A 09/28/2015    Procedure: Left Heart Cath and Coronary Angiography;  Surgeon: Runell Gess, MD;  Location: [facility] INVASIVE CV LAB;  Service: Cardiovascular;  Laterality: N/A;  . Cardiac catheterization N/A 10/08/2015    Procedure: Left Heart Cath and Coronary Angiography;  Surgeon: Lyn Records, MD;  Location: [facility] INVASIVE CV LAB;  Service: Cardiovascular;  Laterality: N/A;    There were no vitals filed for this visit.      Subjective Assessment - 11/01/15 0908    Patient is accompained by: Family member  [name], wife            SLP Evaluation [facility] - 11/01/15 0909    SLP Visit Information   SLP Received On 11/01/15   Referring Provider Claudette Laws, MD   Onset Date October 04, 2015   Medical Diagnosis CVA   Pain Assessment   Pain Score 0-No pain   General Information   HPI 65 year old male admitted 3/7 chest pain, becoming unresponsive shortely before arrival to ED. Patient with cardiac arrest (brief chest  compressions),  NSTEMI, intubated. Possible CVA symptoms noted on 3/13. 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 Extubated 3/20.    Prior Functional Status   Cognitive/Linguistic Baseline Within functional limits   Type of Home House    Lives With Spouse   Vocation Full time employment  grill cook at [employer]   Overall Cognitive Status Impaired/Different from baseline   Area of Impairment Memory   Memory Decreased short-term memory   Memory Comments See coments below re: swallow precautions and swallow HEP   Awareness --  reports he cannot stand long now (emergent awareness)   Auditory Comprehension   Overall Auditory Comprehension Appears within functional limits for tasks assessed   Verbal Expression   Overall Verbal Expression Appears within functional limits for tasks assessed   Oral Motor/Sensory Function   Overall Oral Motor/Sensory Function Appears within functional limits for tasks assessed   Motor Speech   Overall Motor Speech Impaired  pt intubated for 2 weeks   Phonation Breathy;Low vocal intensity  wife states pt sounds like "almost like laryngitis"   Resonance Within functional limits     Pt only able to perform one swallowing exercise from memory, effortful swallow. SLP is of impression pt has not been performing swallowing HEP  after d/c from hospital. Pt named one swallowing precaution from modified barium swallow (MBSS) 10-21-15, multiple swallows. Pt named pudding thick liquids with SBA. Wife is thickening all liquids, reportedly.  Pt req'd max/total A for other precuations including small bites, effortful swallows, clearing throat intermittently, and ice chips <30 minutes after eating, after thorough oral care.                    SLP Education - 11/01/15 0955    Education provided Yes   Education Details swallow precautions, s/s aspiration PNA, aspiration PNA result of not following swallow  precautions, silent aspiration, suspected reason for voice disorder (prolonged intubation)   Person(s) Educated Spouse;Patient   Methods Explanation;Handout   Comprehension Verbalized understanding;Need further instruction          SLP Short Term Goals - 11/01/15 1012    SLP SHORT TERM GOAL #1   Title pt will perform HEP for swallowing with occasional min A   Time 4   Period Weeks   Status New   SLP SHORT TERM GOAL #2   Title pt will demo swallow precuations with POS with rare min A   Time 4   Period Weeks   Status New   SLP SHORT TERM GOAL #3   Title pt will state 3/4  memory compensations over 3 sessions   Time 4   Period Weeks   Status New          SLP Long Term Goals - 11/01/15 1014    SLP LONG TERM GOAL #1   Title pt will complete HEP for swallowing with rare min A   Time 8   Period Weeks   Status New   SLP LONG TERM GOAL #2   Title pt will tell SLP how using memory strategies could benefit him   Time 8   Period Weeks   Status New   SLP LONG TERM GOAL #3   Title pt will demo swallowing precautions with POs with modified independence   Time 8   Period Weeks   Status New          Plan - 11/01/15 1001    Clinical Impression Statement Pt with signs of memory deficits and awareness deficits, as well as 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 Pt with modified barium swallow eval  10-21-15 ID'ing moderate pharyngeal dysphasia -decr'd hyolaryngeal excursion and tongue base retraction resulted in residues in pharynx. Decr'd epiglottic inversion also noted, decr'ing efficacy of airway closure. Dys III with pudding thick liquids rec'd, with ice chips <30 minutes after meals and after thorough oral care. Pharyngeal exercises were provided along with swallow precautions. Pt would beneift from skilled ST to address cognitive and swallowing deficits in order to reduce caregiver burden and incr pt's chances of return to work at some point in  the future. Goals will be added as appropriate, with pt agreement.   Speech Therapy Frequency 2x / week   Duration --  8 weeks   Treatment/Interventions Aspiration precaution training;Pharyngeal strengthening exercises;Diet toleration management by SLP;Cognitive reorganization;SLP instruction and feedback;Compensatory strategies;Internal/external aids;Patient/family education;Functional tasks;Cueing hierarchy   Potential to Achieve Goals Fair   Potential Considerations Cooperation/participation level;Ability to learn/carryover information   Consulted and Agree with Plan of Care Patient;Family member/caregiver   Family Member Consulted wife, [name]      Patient will benefit from skilled therapeutic intervention in order to improve the following deficits and impairments:   Dysphagia, pharyngeal phase  Cognitive  communication deficit  Other voice and resonance disorders    Problem List Patient Active Problem List   Diagnosis Date Noted  . Long term (current) use of anticoagulants [Z79.01] 11/01/2015  . Ischemic stroke of frontal lobe (HCC) 10/15/2015  . Anoxic encephalopathy (HCC) 10/15/2015  . Right-sided cerebrovascular accident (CVA) (HCC)   . Acute on chronic systolic congestive heart failure (HCC)   . HCAP (healthcare-associated pneumonia)   . Dysphagia as late effect of cerebrovascular disease   . Tobacco abuse   . Tachypnea   . Essential hypertension   . Type 2 diabetes mellitus with complication (HCC)   . Leukocytosis   . Absolute anemia   . Altered mental status   . Hemiplegia (HCC)   . Stroke (HCC)   . ARDS (adult respiratory distress syndrome) (HCC)   . Acute respiratory failure (HCC)   . STEMI (ST elevation myocardial infarction) (HCC) 09/28/2015  . Cardiac arrest (HCC)   . ST elevation myocardial infarction (STEMI) (HCC)   . Encounter for central line placement   . Hypokalemia   . Encephalopathy acute   . Acute hypoxemic respiratory failure (HCC)      SCHINKE,CARL ,MS, CCC-SLP  11/01/2015, 10:18 AM  [facility] [address] [place], [place], [address] Phone: [phone]   Fax:  [phone]  Name: Eddie [name] MRN: [id number] Date of Birth: [birth year]

## 2015-11-01 NOTE — Patient Instructions (Signed)
   WHEN YOU EAT:  -Swallow hard  -Swallow two times  -Clear your throat during your meal  -Small bites   -Pudding thick liquids

## 2015-11-01 NOTE — Telephone Encounter (Signed)
Pt packet sent.  

## 2015-11-01 NOTE — Progress Notes (Signed)
Patient in office for INR check also.  BP mildly elevated at 152/88.   Currently on carvedilol 6.25 bid, lisinopril 2.5 mg qd and spironolactone 12.5 mg qd.  Patient was to get labs today, but is not fasting, so will have drawn next Monday when comes.  No changes in medications today, will monitor next week then check his BMET before adjusting one of those medications.

## 2015-11-02 ENCOUNTER — Ambulatory Visit: Payer: BLUE CROSS/BLUE SHIELD

## 2015-11-02 DIAGNOSIS — R2689 Other abnormalities of gait and mobility: Secondary | ICD-10-CM

## 2015-11-02 DIAGNOSIS — I69354 Hemiplegia and hemiparesis following cerebral infarction affecting left non-dominant side: Secondary | ICD-10-CM

## 2015-11-02 NOTE — Therapy (Signed)
[facility] [address] [place], [place], [address] Phone: [phone]   Fax:  [phone]  Physical Therapy Evaluation  Patient Details  Name: Eddie [name] MRN: [id number] Date of Birth: 18-Jul-1951 Referring Provider: Dr. [name]  Encounter Date: 11/02/2015      PT End of Session - 11/02/15 1254    Visit Number 1   Number of Visits 9   Date for PT Re-Evaluation 12/02/15   Authorization Type [employer]-30 visit combo limit   PT Start Time 0931   PT Stop Time 1013   PT Time Calculation (min) 42 min   Equipment Utilized During Treatment Gait belt   Activity Tolerance Patient tolerated treatment well   Behavior During Therapy [facility] for tasks assessed/performed      Past Medical History  Diagnosis Date  . PUD (peptic ulcer disease)   . CAD (coronary artery disease)     a. STEMI 09/2015 w/ DES to Prox LAD  . CVA (cerebral infarction)     a. 09/2015: acute small right frontal lobe infarct    Past Surgical History  Procedure Laterality Date  . Repair of peptic ulcer    . Cardiac catheterization N/A 09/28/2015    Procedure: Left Heart Cath and Coronary Angiography;  Surgeon: Runell Gess, MD;  Location: [facility] INVASIVE CV LAB;  Service: Cardiovascular;  Laterality: N/A;  . Cardiac catheterization N/A 10/08/2015    Procedure: Left Heart Cath and Coronary Angiography;  Surgeon: Lyn Records, MD;  Location: [facility] INVASIVE CV LAB;  Service: Cardiovascular;  Laterality: N/A;    There were no vitals filed for this visit.       Subjective Assessment - 11/02/15 0941    Subjective Pt's wife, [name], present. Pt reported he was admitted to hospital on 10/15/15 for MI, and imaging indicated acute and chronic CVAs. Pt d/c on 10/26/15.  Pt reported he feels like he's been getting better, but he stills needs to use cane for balance during amb. over uneven terrain and long distances. Pt denied falls since CVA onset. Pt states that his  voice was his biggest issues after the stroke.   Patient is accompained by: Family member  wife-[name]   Pertinent History MI, HTN, DM, CAD, L eye blindness   Patient Stated Goals Walk without cane, wash car without losing balance   Currently in Pain? No/denies            [facility] PT Assessment - 11/02/15 0946    Assessment   Medical Diagnosis Ischemic stroke of frontal lobe, anoxic encephalopathy   Referring Provider Dr. [name]   Onset Date/Surgical Date 10/15/15   Hand Dominance Right   Prior Therapy Inpt rehab   Precautions   Precautions Fall   Precaution Comments based on FGA score   Restrictions   Weight Bearing Restrictions No   Balance Screen   Has the patient fallen in the past 6 months No   Has the patient had a decrease in activity level because of a fear of falling?  Yes   Is the patient reluctant to leave their home because of a fear of falling?  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   Cognition   Overall Cognitive Status Impaired/Different from baseline   Area of Impairment Memory   Memory Decreased short-term memory   Sensation   Light Touch Appears Intact   Additional Comments Pt denied N/T   Coordination   Gross Motor Movements are Fluid and Coordinated Yes   Fine Motor Movements are Fluid and Coordinated Yes   Posture/Postural Control   Posture/Postural Control No significant limitations   ROM / Strength   AROM / PROM / Strength AROM;Strength   AROM   Overall AROM  Within functional limits for tasks performed   Overall AROM Comments B UE/LE WFL   Strength   Overall  Strength Deficits   Overall Strength Comments RLE grossly: 5/5 and LLE: grossly 4/5, except 3+/5 R ankle DF.   Transfers   Transfers Sit to Stand;Stand to Sit   Sit to Stand 6: Modified independent (Device/Increase time);Without upper extremity assist;From chair/3-in-1   Stand to Sit 6: Modified independent (Device/Increase time);Without upper extremity assist;To chair/3-in-1   Ambulation/Gait   Ambulation/Gait Yes   Ambulation/Gait Assistance 5: Supervision;4: Min guard   Ambulation/Gait Assistance Details Min guard to S without cane due to intermittent narrow BOS and incr. postural sway.   Ambulation Distance (Feet) 100 Feet   Assistive device None;Other (Comment)  hurrycane   Gait Pattern Step-through pattern;Decreased stride length;Narrow base of support;Decreased trunk rotation   Ambulation Surface Level;Indoor   Gait velocity 3.35ft/sec.  with cane and 2.8ft/sec.   Functional Gait  Assessment   Gait assessed  Yes   Gait Level Surface Walks 20 ft in less than 7 sec but greater than 5.5 sec, uses assistive device, slower speed, mild gait deviations, or deviates 6-10 in outside of the 12 in walkway width.  8.1sec.   Change in Gait Speed Able to change speed, demonstrates mild gait deviations, deviates 6-10 in outside of the 12 in walkway width, or no gait deviations, unable to achieve a major change in velocity, or uses a change in velocity, or uses an assistive device.   Gait with Horizontal Head Turns Performs head turns smoothly with slight change in gait velocity (eg, minor disruption to smooth gait path), deviates 6-10 in outside 12 in walkway width, or uses an assistive device.   Gait with Vertical Head Turns Performs task with slight change in gait velocity (eg, minor disruption to smooth gait path), deviates 6 - 10 in outside 12 in walkway width or uses assistive device   Gait and Pivot Turn Pivot turns safely within 3 sec and stops quickly with no loss of balance.   Step Over  Obstacle Is able to step over 2 stacked shoe boxes taped together (9 in total height) without changing gait speed. No evidence of imbalance.   Gait with Narrow Base of Support Ambulates less than 4 steps heel to toe or cannot perform without assistance.  0 steps   Gait with Eyes Closed Walks 20 ft, slow speed, abnormal gait pattern, evidence for imbalance, deviates 10-15 in outside 12 in walkway width. Requires more than 9 sec to ambulate 20 ft.   Ambulating Backwards Walks 20 ft, uses assistive device, slower speed, mild gait deviations, deviates 6-10 in outside 12 in walkway width.   Steps Alternating feet, must use rail.   Total Score 19  PT Education - 11/02/15 1254    Education provided Yes   Education Details PT discussed frequency/duration and outcome measure results.   Person(s) Educated Patient;Spouse   Methods Explanation   Comprehension Verbalized understanding          PT Short Term Goals - 11/02/15 1300    PT SHORT TERM GOAL #1   Title same as LTGs           PT Long Term Goals - 11/02/15 1300    PT LONG TERM GOAL #1   Title Pt will be IND in HEP to improve balance and strength. Target date: 11/30/15   Status New   PT LONG TERM GOAL #2   Title Pt will improve FGA score to >/=25/30 to decr. falls risk. Target date: 11/30/15   Status New   PT LONG TERM GOAL #3   Title Pt will amb. 300' IND without AD, over uneven terrain (sand, grass), in order to improve functional mobility and to go to the beach this summer/fall. Target date: 11/30/15   Status New   PT LONG TERM GOAL #4   Title Pt will amb. 1000' over paved surfaces (even) IND, no AD, in order to walk dog and improve functional mobility. . Target date: 11/30/15   Status New   PT LONG TERM GOAL #5   Title Pt will verbalize walking program and potentially how to progress to light jogging based on progress, in order to walk/jog with dog. Target date: 11/30/15   Status New                Plan - 11/02/15 1255    Clinical Impression Statement Pt is a pleasant 64y/o male presenting to OPPT neuro s/p MI and CVA (10/15/15). Pt's exam revealed the following deficits: gait deviations-especially during gait without AD, impaired balance, decreased strength, and FGA indicates pt is at a medium risk for falls. Pt noted to amb. with narrow BOS and incr. postural sway, when not amb. with cane.   Rehab Potential Good   Clinical Impairments Affecting Rehab Potential co-morbidities   PT Frequency 2x / week   PT Duration 4 weeks   PT Treatment/Interventions ADLs/Self Care Home Management;Biofeedback;Canalith Repostioning;Therapeutic activities;Therapeutic exercise;Balance training;Manual techniques;Vestibular;Orthotic Fit/Training;Functional mobility training;Stair training;Gait training;DME Instruction;Patient/family education;Cognitive remediation   PT Next Visit Plan Review hospital HEP and progress prn.   Consulted and Agree with Plan of Care Patient;Family member/caregiver   Family Member Consulted pt's wife: [name]      Patient will benefit from skilled therapeutic intervention in order to improve the following deficits and impairments:  Abnormal gait, Decreased endurance, Decreased knowledge of use of DME, Decreased strength, Impaired flexibility, Decreased cognition, Decreased balance, Decreased mobility  Visit Diagnosis: Other abnormalities of gait and mobility - Plan: PT plan of care cert/re-cert  Hemiplegia and hemiparesis following cerebral infarction affecting left non-dominant side (HCC) - Plan: PT plan of care cert/re-cert     Problem List Patient Active Problem List   Diagnosis Date Noted  . Long term (current) use of anticoagulants [Z79.01] 11/01/2015  . Ischemic stroke of frontal lobe (HCC) 10/15/2015  . Anoxic encephalopathy (HCC) 10/15/2015  . Right-sided cerebrovascular accident (CVA) (HCC)   . Acute on chronic systolic congestive heart failure  (HCC)   . HCAP (healthcare-associated pneumonia)   . Dysphagia as late effect of cerebrovascular disease   . Tobacco abuse   . Tachypnea   . Essential hypertension   . Type 2 diabetes mellitus with complication (HCC)   .  Leukocytosis   . Absolute anemia   . Altered mental status   . Hemiplegia (HCC)   . Stroke (HCC)   . ARDS (adult respiratory distress syndrome) (HCC)   . Acute respiratory failure (HCC)   . STEMI (ST elevation myocardial infarction) (HCC) 09/28/2015  . Cardiac arrest (HCC)   . ST elevation myocardial infarction (STEMI) (HCC)   . Encounter for central line placement   . Hypokalemia   . Encephalopathy acute   . Acute hypoxemic respiratory failure (HCC)     Mahki Spikes L 11/02/2015, 1:06 PM  [facility] [address] [place], [place], [address] Phone: [phone]   Fax:  [phone]  Name: Eddie [name] MRN: [id number] Date of Birth: [birth year]   Zerita Boers, PT,DPT 11/02/2015 1:06 PM Phone: [phone] Fax: [phone]

## 2015-11-08 ENCOUNTER — Ambulatory Visit (INDEPENDENT_AMBULATORY_CARE_PROVIDER_SITE_OTHER): Payer: BLUE CROSS/BLUE SHIELD | Admitting: Pharmacist Clinician (PhC)/ Clinical Pharmacy Specialist

## 2015-11-08 DIAGNOSIS — Z7901 Long term (current) use of anticoagulants: Secondary | ICD-10-CM

## 2015-11-08 DIAGNOSIS — I63311 Cerebral infarction due to thrombosis of right middle cerebral artery: Secondary | ICD-10-CM

## 2015-11-08 LAB — POCT INR: INR: 1.7

## 2015-11-09 ENCOUNTER — Ambulatory Visit (HOSPITAL_BASED_OUTPATIENT_CLINIC_OR_DEPARTMENT_OTHER): Payer: BLUE CROSS/BLUE SHIELD | Admitting: Physical Medicine & Rehabilitation

## 2015-11-09 ENCOUNTER — Encounter: Payer: BLUE CROSS/BLUE SHIELD | Attending: Physical Medicine & Rehabilitation

## 2015-11-09 ENCOUNTER — Encounter: Payer: Self-pay | Admitting: Physical Medicine & Rehabilitation

## 2015-11-09 VITALS — BP 145/83 | HR 97

## 2015-11-09 DIAGNOSIS — R269 Unspecified abnormalities of gait and mobility: Secondary | ICD-10-CM | POA: Diagnosis not present

## 2015-11-09 DIAGNOSIS — I2510 Atherosclerotic heart disease of native coronary artery without angina pectoris: Secondary | ICD-10-CM | POA: Diagnosis not present

## 2015-11-09 DIAGNOSIS — R1313 Dysphagia, pharyngeal phase: Secondary | ICD-10-CM | POA: Insufficient documentation

## 2015-11-09 DIAGNOSIS — I639 Cerebral infarction, unspecified: Secondary | ICD-10-CM | POA: Diagnosis not present

## 2015-11-09 DIAGNOSIS — R41841 Cognitive communication deficit: Secondary | ICD-10-CM | POA: Insufficient documentation

## 2015-11-09 DIAGNOSIS — G931 Anoxic brain damage, not elsewhere classified: Secondary | ICD-10-CM

## 2015-11-09 DIAGNOSIS — K279 Peptic ulcer, site unspecified, unspecified as acute or chronic, without hemorrhage or perforation: Secondary | ICD-10-CM | POA: Diagnosis not present

## 2015-11-09 DIAGNOSIS — R498 Other voice and resonance disorders: Secondary | ICD-10-CM | POA: Diagnosis present

## 2015-11-09 DIAGNOSIS — I69398 Other sequelae of cerebral infarction: Secondary | ICD-10-CM

## 2015-11-09 NOTE — Patient Instructions (Signed)
Follow-up Information    Follow up with Dorrene German, MD On 11/10/2015.   Specialty: Internal Medicine   Why: APPT @ 2:45 Be there @ 2:30 PM for paperwork   Contact information:   [address] [place] [address] [phone]

## 2015-11-09 NOTE — Progress Notes (Signed)
Subjective:    Patient ID: Eddie [name], male    DOB: 04-14-1951, 65 y.o.   MRN: [id number] 65 y.o. male who was admitted on 09/28/15 with chest pain with ST elevation due to anterior STEMI and developed unresponsiveness enroute to ED. He was intubated and treated with brief compressions and epi for PEA. He was taken to cath lab urgently and DES placed to LAD. 2D echo with EF 10-15%. He was treated with hypothermia protocol and during rewarming was noted to have agitation as well as left sided weakness on 03/13. 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 He developed copious oral secretions due to MSSA HCAP and was started on Vanc/Fortaz for treatment. Repeat echo with EF 30-35% with diffuse hypokinesis and no PFO or thrombus. EEG ordered due to fluctuating MS and showed generalized background slowing due to "pharmacologic, hypoxic, toxic-metabolic or other diffuse physiologic etiology". Treated with ARDS protocol and tolerated extubation on 03/21. Dr. Roda Shutters felt that stroke possibly cardio-embolic. Patient to continue ASA/Brilinta for now and start warfarin/plavix in about a month. He was started on dysphagia I diet with pudding liquids HPI  Pain Inventory Average Pain 0 Pain Right Now 0 My pain is none  In the last 24 hours, has pain interfered with the following? General activity 0 Relation with others 0 Enjoyment of life 0 What TIME of day is your pain at its worst? no pain Sleep (in general) Good  Pain is worse with: No pain Pain improves with: No Pain Relief from Meds: No Pain  Mobility use a cane how many minutes can you walk? 30 min ability to climb steps?  yes do you drive?  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  Physicians involved in your care .   History reviewed. No pertinent family history. Social History   Social History  . Marital Status: Married    Spouse Name: N/A  . Number of Children: N/A  . Years of Education: N/A   Social History Main Topics  . Smoking status: Current Every Day Smoker -- 0.25 packs/day  . Smokeless tobacco: None  . Alcohol Use: No  . Drug Use: None  . Sexual Activity: Not Asked   Other Topics Concern  . None   Social History Narrative   Past Surgical History  Procedure Laterality Date  . Repair of peptic ulcer    . Cardiac catheterization N/A 09/28/2015    Procedure: Left Heart Cath and Coronary Angiography;  Surgeon: Runell Gess, MD;  [facility] INVASIVE CV LAB;  Service: Cardiovascular;  Laterality: N/A;  . Cardiac catheterization N/A 10/08/2015    Procedure: Left Heart Cath and Coronary Angiography;  Surgeon: Lyn Records, MD;  Location: [facility] INVASIVE CV LAB;  Service: Cardiovascular;  Laterality: N/A;   Past Medical History  Diagnosis Date  . PUD (peptic ulcer disease)   . CAD (coronary artery disease)     a. STEMI 09/2015 w/ DES to Prox LAD  . CVA (cerebral infarction)     a. 09/2015: acute small right frontal lobe infarct   There were no vitals taken for this visit.  Opioid Risk Score:   Fall Risk Score:  `1  Depression screen PHQ 2/9  Depression screen [facility] 2/9 11/09/2015  Decreased Interest 0  Down, Depressed, Hopeless 0  PHQ - 2 Score 0     Review of Systems     Objective:   Physical Exam  Constitutional: He is oriented to person, place, and time. He appears well-developed.  Cardiovascular: Normal rate, regular rhythm and normal heart sounds.   Pulmonary/Chest: Effort normal and breath sounds normal.  Abdominal: Soft. Bowel sounds are normal.  Neurological: He is alert and oriented to person, place, and time. He exhibits normal muscle tone. Gait abnormal.  Motor strength is 5/5 in the right deltoid, biceps,  triceps and grip, hip flexors, knee extensors, ankle dorsi flexor plantar flexion 4/5 in the left deltoid, biceps, triceps, grip, hip flexion, knee extensor, ankle dorsi flexor plantar flexor There is Left visual field deficit however this appears to be due to blindness in the left eye  Ambulates with a widened base of support noted No toe drag or knee instability uses a narrow base quad cane  Skin: Skin is warm and dry.  Psychiatric: He has a normal mood and affect.  Nursing note and vitals reviewed.         Assessment & Plan:  1. Cognitive, mobility, and functional deficits secondary to anoxic encephalopathy and right frontal infarct-Now ambulating with a narrow-base quad cane start outpatient PT and OT and speech No driving for now, do not think that he has the awareness or reaction time to do this safely yet. We'll reassess after 1 month.    2. Anterior STEMI: to continue ASA, Brilinta, BB and statin.Follow-up with cardiology 3. Acute systolic heart failure: Has been compensated. Check daily weights and monitor for signs of overload. Continue aldactone, coreg and lisinopril. Follow-up with cardiology . 4. 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 Neurology recommends coumadin plus plavix in one month. Patient on both these medicines, warfarin is still subtherapeutic.  Cardiology is following 5. MSSA PNA: No sign of recurrence    6. DM type 2: New diagnosis with Hgb A1C-6.9. BS uncontrolled due to tube feeds, Follow-up with PCP tomorrow  To establish care, patient does not have glucometer per his report

## 2015-11-15 ENCOUNTER — Ambulatory Visit (INDEPENDENT_AMBULATORY_CARE_PROVIDER_SITE_OTHER): Payer: BLUE CROSS/BLUE SHIELD | Admitting: Pharmacist Clinician (PhC)/ Clinical Pharmacy Specialist

## 2015-11-15 DIAGNOSIS — I63311 Cerebral infarction due to thrombosis of right middle cerebral artery: Secondary | ICD-10-CM | POA: Diagnosis not present

## 2015-11-15 DIAGNOSIS — Z7901 Long term (current) use of anticoagulants: Secondary | ICD-10-CM | POA: Diagnosis not present

## 2015-11-15 LAB — POCT INR: INR: 3.4

## 2015-11-19 ENCOUNTER — Ambulatory Visit: Payer: BLUE CROSS/BLUE SHIELD

## 2015-11-19 DIAGNOSIS — R41841 Cognitive communication deficit: Secondary | ICD-10-CM

## 2015-11-19 DIAGNOSIS — R1313 Dysphagia, pharyngeal phase: Secondary | ICD-10-CM

## 2015-11-19 DIAGNOSIS — I69354 Hemiplegia and hemiparesis following cerebral infarction affecting left non-dominant side: Secondary | ICD-10-CM

## 2015-11-19 DIAGNOSIS — R2689 Other abnormalities of gait and mobility: Secondary | ICD-10-CM

## 2015-11-19 NOTE — Therapy (Signed)
[facility] [address] [place], [place], [address] Phone: [phone]   Fax:  [phone]  Speech Language Pathology Treatment  Patient Details  Name: Eddie [name] MRN: [id number] Date of Birth: May 20, 1951 Referring Provider: Claudette Laws, MD  Encounter Date: 11/19/2015      End of Session - 11/19/15 1047    Visit Number 2   Number of Visits 16   Authorization - Visit Number 2   Authorization - Number of Visits 30   SLP Start Time 0803   SLP Stop Time  0840  pt satisfied with progress today   SLP Time Calculation (min) 37 min   Activity Tolerance Patient tolerated treatment well      Past Medical History  Diagnosis Date  . PUD (peptic ulcer disease)   . CAD (coronary artery disease)     a. STEMI 09/2015 w/ DES to Prox LAD  . CVA (cerebral infarction)     a. 09/2015: acute small right frontal lobe infarct    Past Surgical History  Procedure Laterality Date  . Repair of peptic ulcer    . Cardiac catheterization N/A 09/28/2015    Procedure: Left Heart Cath and Coronary Angiography;  Surgeon: Runell Gess, MD;  Location: [facility] INVASIVE CV LAB;  Service: Cardiovascular;  Laterality: N/A;  . Cardiac catheterization N/A 10/08/2015    Procedure: Left Heart Cath and Coronary Angiography;  Surgeon: Lyn Records, MD;  Location: [facility] INVASIVE CV LAB;  Service: Cardiovascular;  Laterality: N/A;    There were no vitals filed for this visit.      Subjective Assessment - 11/19/15 0802    Subjective Pt telling SLP precautions, and then pudding thick liquids for "swallowing exercises".                ADULT SLP TREATMENT - 11/19/15 0810    General Information   Behavior/Cognition Alert;Cooperative;Pleasant mood   Treatment Provided   Treatment provided Dysphagia   Dysphagia Treatment   Temperature Spikes Noted No   Respiratory Status Room air   Oral Cavity - Dentition Missing dentition   Treatment  Methods Skilled observation;Compensation strategy training;Patient/caregiver education   Patient observed directly with PO's Yes   Type of PO's observed Dysphagia 3 (soft);Pudding-thick liquids   Liquids provided via Cup   Type of cueing --  initial min A to clear throat but pt became independent   Other treatment/comments Pt asked how to keep drinks cold if ice thins liquids. SLP told him to use a frozen plastic mug. Pt's wife req'd min A to make liquid pudding-thick and not honey/nectar. Pt was educated by SLP rationale behind throat clear/reswallow, and could teachback 10 minutes later. Teachback method also used for why pt needs to compete HEP as directed.    Assessment / Recommendations / Plan   Plan Continue with current plan of care   Dysphagia Recommendations   Diet recommendations Dysphagia 3 (mechanical soft);Pudding-thick liquid   Liquids provided via Cup   Medication Administration Whole meds with puree   Compensations Slow rate;Small sips/bites;Multiple dry swallows after each bite/sip;Follow solids with liquid;Clear throat intermittently   Progression Toward Goals   Progression toward goals Progressing toward goals          SLP Education - 11/19/15 1046    Education provided Yes   Education Details rationale for HEP for dysphagia, rationale for throat clear/reswallow as precaution, swallow precautions   Person(s) Educated Patient;Spouse   Methods Explanation;Demonstration;Verbal cues   Comprehension  Verbalized understanding;Returned demonstration          SLP Short Term Goals - 11/19/15 1339    SLP SHORT TERM GOAL #1   Title pt will perform HEP for swallowing with occasional min A   Time 4   Period Weeks   Status On-going   SLP SHORT TERM GOAL #2   Title pt will demo swallow precuations with POS with rare min A   Time 4   Period Weeks   Status On-going   SLP SHORT TERM GOAL #3   Title pt will state 3/4  memory compensations over 3 sessions   Time 4   Period  Weeks   Status On-going          SLP Long Term Goals - 11/19/15 1339    SLP LONG TERM GOAL #1   Title pt will complete HEP for swallowing with rare min A   Time 8   Period Weeks   Status On-going   SLP LONG TERM GOAL #2   Title pt will tell SLP how using memory strategies could benefit him   Time 8   Period Weeks   Status On-going   SLP LONG TERM GOAL #3   Title pt will demo swallowing precautions with POs with modified independence   Time 8   Period Weeks   Status On-going          Plan - 11/19/15 1049    Clinical Impression Statement Pt did not recall to bring swallowing HEP today, so SLP focused on precautions and thickening liquids.    Speech Therapy Frequency 2x / week   Duration --  8 weeks   Treatment/Interventions Aspiration precaution training;Pharyngeal strengthening exercises;Diet toleration management by SLP;Cognitive reorganization;SLP instruction and feedback;Compensatory strategies;Internal/external aids;Patient/family education;Functional tasks;Cueing hierarchy   Potential to Achieve Goals Fair   Potential Considerations Cooperation/participation level;Ability to learn/carryover information   Consulted and Agree with Plan of Care Patient;Family member/caregiver   Family Member Consulted wife, [name]      Patient will benefit from skilled therapeutic intervention in order to improve the following deficits and impairments:   Dysphagia, pharyngeal phase  Cognitive communication deficit    Problem List Patient Active Problem List   Diagnosis Date Noted  . Long term (current) use of anticoagulants [Z79.01] 11/01/2015  . Ischemic stroke of frontal lobe (HCC) 10/15/2015  . Anoxic encephalopathy (HCC) 10/15/2015  . Right-sided cerebrovascular accident (CVA) (HCC)   . Acute on chronic systolic congestive heart failure (HCC)   . HCAP (healthcare-associated pneumonia)   . Dysphagia as late effect of cerebrovascular disease   . Tobacco abuse   . Tachypnea    . Essential hypertension   . Type 2 diabetes mellitus with complication (HCC)   . Leukocytosis   . Absolute anemia   . Altered mental status   . Hemiplegia (HCC)   . Stroke (HCC)   . ARDS (adult respiratory distress syndrome) (HCC)   . Acute respiratory failure (HCC)   . STEMI (ST elevation myocardial infarction) (HCC) 09/28/2015  . Cardiac arrest (HCC)   . ST elevation myocardial infarction (STEMI) (HCC)   . Encounter for central line placement   . Hypokalemia   . Encephalopathy acute   . Acute hypoxemic respiratory failure (HCC)     Don Tiu ,MS, CCC-SLP  11/19/2015, 1:40 PM  [facility] [place] [facility] [address] [place], [place], [address] Phone: [phone]   Fax:  [phone]   Name: DELFORD WINGERT MRN: [id number] Date of  Birth: Dec 17, 1950

## 2015-11-19 NOTE — Therapy (Signed)
[facility] [address] [place], [place], [address] Phone: [phone]   Fax:  [phone]  Physical Therapy Treatment  Patient Details  Name: Eddie [name] MRN: [id number] Date of Birth: 03/07/1951 Referring Provider: Dr. [name]  Encounter Date: 11/19/2015      PT End of Session - 11/19/15 1245    Visit Number 2   Number of Visits 9   Date for PT Re-Evaluation 12/02/15   Authorization Type BCBS-30 visit combo limit   PT Start Time 1148   PT Stop Time 1229   PT Time Calculation (min) 41 min   Equipment Utilized During Treatment Gait belt   Activity Tolerance Patient tolerated treatment well   Behavior During Therapy [facility] for tasks assessed/performed      Past Medical History  Diagnosis Date  . PUD (peptic ulcer disease)   . CAD (coronary artery disease)     a. STEMI 09/2015 w/ DES to Prox LAD  . CVA (cerebral infarction)     a. 09/2015: acute small right frontal lobe infarct    Past Surgical History  Procedure Laterality Date  . Repair of peptic ulcer    . Cardiac catheterization N/A 09/28/2015    Procedure: Left Heart Cath and Coronary Angiography;  Surgeon: Runell Gess, MD;  Location: [facility] INVASIVE CV LAB;  Service: Cardiovascular;  Laterality: N/A;  . Cardiac catheterization N/A 10/08/2015    Procedure: Left Heart Cath and Coronary Angiography;  Surgeon: Lyn Records, MD;  Location: [facility] INVASIVE CV LAB;  Service: Cardiovascular;  Laterality: N/A;    There were no vitals filed for this visit.      Subjective Assessment - 11/19/15 1150    Subjective Pt denied falls since last visit. pt reported he mowed his small lawn (size of our lobby) yesterday, he reported the hills made him tired ("from knees down it tires me out").   Patient is accompained by: Family member   Patient Stated Goals Walk without cane, wash car without losing balance   Currently in Pain? No/denies                          [facility] Adult PT Treatment/Exercise - 11/19/15 1201    Ambulation/Gait   Ambulation/Gait Yes   Ambulation/Gait Assistance 6: Modified independent (Device/Increase time);5: Supervision   Ambulation/Gait Assistance Details S required during last minute of amb. 2/2 intermittent narrow BOS and LOB which pt corrected with stepping statgey. Pt required seated rest breaks after amb. 2/2 fatigue.    Ambulation Distance (Feet) --  10' and (575 in 5 minutes) with cane, 25'x2 no cane   Assistive device Straight cane with tripod attachment   Gait Pattern Step-through pattern;Decreased stride length;Narrow base of support;Decreased trunk rotation   Ambulation Surface Level;Indoor     Therex and neuro re-ed: Pt performed progressed HEP, please see pt instructions for details. Cues and demonstration for technique. Pt denied pain during and after exercises. Balance exercises performed with 1 UE support on counter.         PT Education - 11/19/15 1244    Education provided Yes   Education Details PT progressed pt's hospital HEP and provided walking program. Pt reiterated the importance of always using cane during amb. for safety.   Person(s) Educated Patient;Spouse   Methods Explanation;Demonstration;Tactile cues;Verbal cues;Handout   Comprehension Returned demonstration;Verbalized understanding          PT Short Term Goals - 11/02/15 1300  PT SHORT TERM GOAL #1   Title same as LTGs           PT Long Term Goals - 11/19/15 1253    PT LONG TERM GOAL #1   Title Pt will be IND in HEP to improve balance and strength. Target date: 11/30/15   Status On-going   PT LONG TERM GOAL #2   Title Pt will improve FGA score to >/=25/30 to decr. falls risk. Target date: 11/30/15   Status On-going   PT LONG TERM GOAL #3   Title Pt will amb. 300' IND without AD, over uneven terrain (sand, grass), in order to improve functional mobility and to go to the beach this  summer/fall. Target date: 11/30/15   Status On-going   PT LONG TERM GOAL #4   Title Pt will amb. 1000' over paved surfaces (even) IND, no AD, in order to walk dog and improve functional mobility. . Target date: 11/30/15   Status On-going   PT LONG TERM GOAL #5   Title Pt will verbalize walking program and potentially how to progress to light jogging based on progress, in order to walk/jog with dog. Target date: 11/30/15   Status On-going               Plan - 11/19/15 1251    Clinical Impression Statement Pt demonstrated progress, as he was able to progress from hospital HEP to higher level balance and strengthening HEP. Pt continues to experience LOB and incr. postural sway during amb. without SPC. continue with POC.   Rehab Potential Good   Clinical Impairments Affecting Rehab Potential co-morbidities   PT Frequency 2x / week   PT Duration 4 weeks   PT Treatment/Interventions ADLs/Self Care Home Management;Biofeedback;Canalith Repostioning;Therapeutic activities;Therapeutic exercise;Balance training;Manual techniques;Vestibular;Orthotic Fit/Training;Functional mobility training;Stair training;Gait training;DME Instruction;Patient/family education;Cognitive remediation   PT Next Visit Plan Gait without SPC and balance on compliant surfaces.   PT Home Exercise Plan Strengthening, balance, and flexibility HEP   Consulted and Agree with Plan of Care Patient;Family member/caregiver   Family Member Consulted pt's wife: [name]      Patient will benefit from skilled therapeutic intervention in order to improve the following deficits and impairments:  Abnormal gait, Decreased endurance, Decreased knowledge of use of DME, Decreased strength, Impaired flexibility, Decreased cognition, Decreased balance, Decreased mobility  Visit Diagnosis: Other abnormalities of gait and mobility  Hemiplegia and hemiparesis following cerebral infarction affecting left non-dominant side [facility])     Problem  List Patient Active Problem List   Diagnosis Date Noted  . Long term (current) use of anticoagulants [Z79.01] 11/01/2015  . Ischemic stroke of frontal lobe (HCC) 10/15/2015  . Anoxic encephalopathy (HCC) 10/15/2015  . Right-sided cerebrovascular accident (CVA) (HCC)   . Acute on chronic systolic congestive heart failure (HCC)   . HCAP (healthcare-associated pneumonia)   . Dysphagia as late effect of cerebrovascular disease   . Tobacco abuse   . Tachypnea   . Essential hypertension   . Type 2 diabetes mellitus with complication (HCC)   . Leukocytosis   . Absolute anemia   . Altered mental status   . Hemiplegia (HCC)   . Stroke (HCC)   . ARDS (adult respiratory distress syndrome) (HCC)   . Acute respiratory failure (HCC)   . STEMI (ST elevation myocardial infarction) (HCC) 09/28/2015  . Cardiac arrest (HCC)   . ST elevation myocardial infarction (STEMI) (HCC)   . Encounter for central line placement   . Hypokalemia   . Encephalopathy acute   .  Acute hypoxemic respiratory failure (HCC)     Wandy Bossler L 11/19/2015, 12:54 PM  [facility] [address] [place], [place], [address] Phone: [phone]   Fax:  [phone]  Name: Eddie [name] MRN: [id number] Date of Birth: [birth year]    Zerita Boers, PT,DPT 11/19/2015 12:54 PM Phone: [phone] Fax: [phone]

## 2015-11-19 NOTE — Patient Instructions (Addendum)
Walking Program:  Begin walking for exercise for  minutes, 1-2 times/day, 5 days/week.   Progress your walking program by adding 2 minutes to your routine each week, as tolerated. The goal is to walk 30 minutes in a row. Be sure to wear good walking shoes, walk in a safe environment and only progress to your tolerance.      For example: Week 1: walk 5 minutes and then Week 2: walk 7 mintues. Stop if you have severe shortness of breath, chest pain, or weakness please STOP and notify doctor.   Piriformis Stretch, Sitting    Sit, one ankle on opposite knee, same-side hand on crossed knee. Push down on knee, keeping spine straight. Lean torso forward, with flat back, until tension is felt in hamstrings and gluteals of crossed-leg side. Hold __30_ seconds.  Repeat with other leg. Repeat _2__ times per session. Do __2_ sessions per day.  Copyright  VHI. All rights reserved.   HIP: Hamstrings - Short Sitting    Rest leg on floor. Keep knee straight. Lift chest. Hold __30_ seconds. Repeat with other leg. Keep back straight. _2__ reps per set, _2__ sets per day, __7_ days per week  Copyright  VHI. All rights reserved.   FUNCTIONAL MOBILITY: Wall Squat    Stance: shoulder-width on floor, against wall. Place feet in front of hips. Bend hips and knees. Keep back straight. Do not allow knees to bend past toes. Hold for 5 seconds. Squeeze glutes and quads to stand. __5_ reps per set, _1__ sets per day, _3-4__ days per week  Copyright  VHI. All rights reserved.    Functional Quadriceps: Sit to Stand    Sit on edge of chair, feet flat on floor. Stand upright, extending knees fully. Repeat ___10_ times per set. Do __2__ sets per session. Do ___1_ sessions per day.  http://orth.exer.us/734   Copyright  VHI. All rights reserved.    Walking on [employer]. Walk on heels for _10__ feet while continuing on a straight path. Repeat in other direction. Do __1_ sessions per  day.  Copyright  VHI. All rights reserved.  Walking on [employer]. Walk on toes for __10__ feet while continuing on a straight path. Repeat in other direction. Do __1__ sessions per day.  Copyright  VHI. All rights reserved.   Side Step Abduction With Exercise Band    Hold counter. Feet turned out slightly, squat. Side step __10_ feet to each side.NO BAND YET. Do __2_ times per day. 3-4 days a week.  http://ss.exer.us/158   Copyright  VHI. All rights reserved.

## 2015-11-23 ENCOUNTER — Telehealth: Payer: Self-pay | Admitting: Cardiology

## 2015-11-23 ENCOUNTER — Ambulatory Visit: Payer: BLUE CROSS/BLUE SHIELD | Attending: Physical Medicine & Rehabilitation | Admitting: Speech Pathology

## 2015-11-23 ENCOUNTER — Ambulatory Visit: Payer: BLUE CROSS/BLUE SHIELD

## 2015-11-23 VITALS — BP 162/108 | HR 81

## 2015-11-23 DIAGNOSIS — R41841 Cognitive communication deficit: Secondary | ICD-10-CM | POA: Insufficient documentation

## 2015-11-23 DIAGNOSIS — R498 Other voice and resonance disorders: Secondary | ICD-10-CM | POA: Diagnosis present

## 2015-11-23 DIAGNOSIS — R1313 Dysphagia, pharyngeal phase: Secondary | ICD-10-CM | POA: Insufficient documentation

## 2015-11-23 DIAGNOSIS — R2689 Other abnormalities of gait and mobility: Secondary | ICD-10-CM | POA: Diagnosis present

## 2015-11-23 NOTE — Therapy (Signed)
[facility] [address] [place], [place], [address] Phone: [phone]   Fax:  [phone]  Speech Language Pathology Treatment  Patient Details  Name: Eddie [name] MRN: [id number] Date of Birth: 07-27-1950 Referring Provider: Claudette Laws, MD  Encounter Date: 11/23/2015      End of Session - 11/23/15 1207    Visit Number 3   Number of Visits 16   Authorization - Visit Number 3   Authorization - Number of Visits 30   SLP Start Time 1104   SLP Stop Time  1145   SLP Time Calculation (min) 41 min   Activity Tolerance Patient tolerated treatment well      Past Medical History  Diagnosis Date  . PUD (peptic ulcer disease)   . CAD (coronary artery disease)     a. STEMI 09/2015 w/ DES to Prox LAD  . CVA (cerebral infarction)     a. 09/2015: acute small right frontal lobe infarct    Past Surgical History  Procedure Laterality Date  . Repair of peptic ulcer    . Cardiac catheterization N/A 09/28/2015    Procedure: Left Heart Cath and Coronary Angiography;  Surgeon: Runell Gess, MD;  Location: [facility] INVASIVE CV LAB;  Service: Cardiovascular;  Laterality: N/A;  . Cardiac catheterization N/A 10/08/2015    Procedure: Left Heart Cath and Coronary Angiography;  Surgeon: Lyn Records, MD;  Location: [facility] INVASIVE CV LAB;  Service: Cardiovascular;  Laterality: N/A;    There were no vitals filed for this visit.             ADULT SLP TREATMENT - 11/23/15 1155    General Information   Behavior/Cognition Alert;Cooperative;Pleasant mood   Treatment Provided   Treatment provided Dysphagia   Dysphagia Treatment   Temperature Spikes Noted No   Respiratory Status Room air   Oral Cavity - Dentition Missing dentition   Treatment Methods Therapeutic exercise;Patient/caregiver education   Patient observed directly with PO's No   Other treatment/comments Trained pt in HEP for phrayngeal and tongue base strengthening.  Pt completed exercises with min A of modeling and verbal instruction. Pt reqiuired mod A initially with [place], which was reduced to occasional min A after tactile cues. Pt and spouse verbalized swallow precautions. Mrs. Ceesay reports that she cues her husband for swallow prercautions with all meals. I instructed them to eliminate distractions during meals. Pt verbalized risks of aspiration with min questioning cues. He reported that he purchased the freezer mugs to keep his pudding thick liquids cold.    Pain Assessment   Pain Assessment No/denies pain   Assessment / Recommendations / Plan   Plan Continue with current plan of care   Dysphagia Recommendations   Diet recommendations Dysphagia 3 (mechanical soft);Pudding-thick liquid   Medication Administration Whole meds with puree   Progression Toward Goals   Progression toward goals Progressing toward goals            SLP Short Term Goals - 11/23/15 1206    SLP SHORT TERM GOAL #1   Title pt will perform HEP for swallowing with occasional min A   Time 3   Period Weeks   Status On-going   SLP SHORT TERM GOAL #2   Title pt will demo swallow precuations with POS with rare min A   Time 3   Period Weeks   Status On-going   SLP SHORT TERM GOAL #3   Title pt will state 3/4  memory compensations over  3 sessions   Time 3   Period Weeks   Status On-going          SLP Long Term Goals - 11/23/15 1206    SLP LONG TERM GOAL #1   Title pt will complete HEP for swallowing with rare min A   Time 7   Period Weeks   Status On-going   SLP LONG TERM GOAL #2   Title pt will tell SLP how using memory strategies could benefit him   Time 7   Period Weeks   Status On-going   SLP LONG TERM GOAL #3   Title pt will demo swallowing precautions with POs with modified independence   Time 7   Period Weeks   Status On-going          Plan - 11/23/15 1203    Clinical Impression Statement Provided swallowing HEP as pt did not have prior  HEP. Pt performed HEP initially with mod A of verbal instruction and modeling, then progressed to min A and good success with all exercises. Pt and spouse report voice continuing to improve. Continue skilled ST to maximize safey of swallow. If voice continues to improve and pt with success with HEP, consider repeat MBSS in 2 weeks.   Speech Therapy Frequency 2x / week   Treatment/Interventions Aspiration precaution training;Pharyngeal strengthening exercises;Diet toleration management by SLP;Cognitive reorganization;SLP instruction and feedback;Compensatory strategies;Internal/external aids;Patient/family education;Functional tasks;Cueing hierarchy   Potential to Achieve Goals Fair   Potential Considerations Severity of impairments   Consulted and Agree with Plan of Care Patient;Family member/caregiver   Family Member Consulted wife, [name]      Patient will benefit from skilled therapeutic intervention in order to improve the following deficits and impairments:   Dysphagia, pharyngeal phase    Problem List Patient Active Problem List   Diagnosis Date Noted  . Long term (current) use of anticoagulants [Z79.01] 11/01/2015  . Ischemic stroke of frontal lobe (HCC) 10/15/2015  . Anoxic encephalopathy (HCC) 10/15/2015  . Right-sided cerebrovascular accident (CVA) (HCC)   . Acute on chronic systolic congestive heart failure (HCC)   . HCAP (healthcare-associated pneumonia)   . Dysphagia as late effect of cerebrovascular disease   . Tobacco abuse   . Tachypnea   . Essential hypertension   . Type 2 diabetes mellitus with complication (HCC)   . Leukocytosis   . Absolute anemia   . Altered mental status   . Hemiplegia (HCC)   . Stroke (HCC)   . ARDS (adult respiratory distress syndrome) (HCC)   . Acute respiratory failure (HCC)   . STEMI (ST elevation myocardial infarction) (HCC) 09/28/2015  . Cardiac arrest (HCC)   . ST elevation myocardial infarction (STEMI) (HCC)   . Encounter for  central line placement   . Hypokalemia   . Encephalopathy acute   . Acute hypoxemic respiratory failure (HCC)     Lovvorn, Radene Journey MS, CCC-SLP 11/23/2015, 12:07 PM  [facility] [address] [place], [place], [address] Phone: [phone]   Fax:  [phone]   Name: RANDON MUSSON MRN: [id number] Date of Birth: 12/31/1950

## 2015-11-23 NOTE — Telephone Encounter (Signed)
[name] ( wife) is calling because [name] Blood pressure is elevated and wants to know what can she do . Please call  Thanks

## 2015-11-23 NOTE — Therapy (Signed)
[facility] [address] [place], [place], [address] Phone: [phone]   Fax:  [phone]  Physical Therapy Treatment  Patient Details  Name: Eddie [name] MRN: [id number] Date of Birth: Jul 21, 1951 Referring Provider: Dr. [name]  Encounter Date: 11/23/2015      PT End of Session - 11/23/15 1228    Visit Number 3   Number of Visits 9   Date for PT Re-Evaluation 12/02/15   Authorization Type BCBS-30 visit combo limit   PT Start Time 1150   PT Stop Time 1223   PT Time Calculation (min) 33 min   Equipment Utilized During Treatment Gait belt   Activity Tolerance Treatment limited secondary to medical complications (Comment)  session ceased early 2/2 elevated BP   Behavior During Therapy [facility] for tasks assessed/performed      Past Medical History  Diagnosis Date  . PUD (peptic ulcer disease)   . CAD (coronary artery disease)     a. STEMI 09/2015 w/ DES to Prox LAD  . CVA (cerebral infarction)     a. 09/2015: acute small right frontal lobe infarct    Past Surgical History  Procedure Laterality Date  . Repair of peptic ulcer    . Cardiac catheterization N/A 09/28/2015    Procedure: Left Heart Cath and Coronary Angiography;  Surgeon: Runell Gess, MD;  Location: [facility] INVASIVE CV LAB;  Service: Cardiovascular;  Laterality: N/A;  . Cardiac catheterization N/A 10/08/2015    Procedure: Left Heart Cath and Coronary Angiography;  Surgeon: Lyn Records, MD;  Location: [facility] INVASIVE CV LAB;  Service: Cardiovascular;  Laterality: N/A;    Filed Vitals:   11/23/15 1221 11/23/15 1222  BP: 171/109 162/108  Pulse: 82 81  1221 reading after amb. And 1222 reading 5 minutes after amb. (while seated).      Subjective Assessment - 11/23/15 1151    Subjective Pt denied falls or changes since last visit. Pt reports HEP is going great. Pt reported medications are getting low and he doesn't have refills, PT informed pt to inform  MD today.   Patient is accompained by: Family member   Pertinent History MI, HTN, DM, CAD, L eye blindness   Patient Stated Goals Walk without cane, wash car without losing balance   Currently in Pain? No/denies                         [facility] Adult PT Treatment/Exercise - 11/23/15 1153    Ambulation/Gait   Ambulation/Gait Yes   Ambulation/Gait Assistance 5: Supervision   Ambulation/Gait Assistance Details Pt amb. over even terrain with and without performing head turns, with incr. postural sway while performing head turns. Incr. postural sway and decr. heel strike during last 250' of amb. 2/2 fatigue. Cues for wt. shifting. when traversing incline/declines and to improve narrow BOS.   Ambulation Distance (Feet) 400 Feet  indoors and 1000' outside   Assistive device None   Gait Pattern Step-through pattern;Decreased stride length;Narrow base of support;Decreased trunk rotation   Ambulation Surface Level;Unlevel;Indoor;Outdoor;Paved;Grass   Gait velocity 2.31ft/sec.  no AD                PT Education - 11/23/15 1226    Education provided Yes   Education Details PT educated pt on elevated BP, and he reported he did take all of his medication this morning. PT eduated pt and his wife on the importance of informing cardiologist of elevated BP, given  his history. Pt reported he has an appt with them tomorrow, PT reiterated the importance of calling cardiologist today and that PT would route note to MD. Pt denied chest pain, HA, dizziness, severe SOB and weakness.    Person(s) Educated Patient;Spouse   Methods Explanation   Comprehension Verbalized understanding          PT Short Term Goals - 11/02/15 1300    PT SHORT TERM GOAL #1   Title same as LTGs           PT Long Term Goals - 11/19/15 1253    PT LONG TERM GOAL #1   Title Pt will be IND in HEP to improve balance and strength. Target date: 11/30/15   Status On-going   PT LONG TERM GOAL #2   Title Pt  will improve FGA score to >/=25/30 to decr. falls risk. Target date: 11/30/15   Status On-going   PT LONG TERM GOAL #3   Title Pt will amb. 300' IND without AD, over uneven terrain (sand, grass), in order to improve functional mobility and to go to the beach this summer/fall. Target date: 11/30/15   Status On-going   PT LONG TERM GOAL #4   Title Pt will amb. 1000' over paved surfaces (even) IND, no AD, in order to walk dog and improve functional mobility. . Target date: 11/30/15   Status On-going   PT LONG TERM GOAL #5   Title Pt will verbalize walking program and potentially how to progress to light jogging based on progress, in order to walk/jog with dog. Target date: 11/30/15   Status On-going               Plan - 11/23/15 1228    Clinical Impression Statement Pt demonstrated incr. postural sway and decr. heel strike during amb. while performing head turns and amb. over uneven terrain, as he relies heavily on vision to maintain balance. PT ceased session early 2/2 elevated BP, and educated pt on the importance of calling cardiologist and notify MD of elevated BP. PT will route note to cardiologist.    Rehab Potential Good   Clinical Impairments Affecting Rehab Potential co-morbidities   PT Frequency 2x / week   PT Duration 4 weeks   PT Treatment/Interventions ADLs/Self Care Home Management;Biofeedback;Canalith Repostioning;Therapeutic activities;Therapeutic exercise;Balance training;Manual techniques;Vestibular;Orthotic Fit/Training;Functional mobility training;Stair training;Gait training;DME Instruction;Patient/family education;Cognitive remediation   PT Next Visit Plan Check BP, and continue gait without AD   PT Home Exercise Plan Strengthening, balance, and flexibility HEP   Consulted and Agree with Plan of Care Patient;Family member/caregiver   Family Member Consulted pt's wife: [name]      Patient will benefit from skilled therapeutic intervention in order to improve the following  deficits and impairments:  Abnormal gait, Decreased endurance, Decreased knowledge of use of DME, Decreased strength, Impaired flexibility, Decreased cognition, Decreased balance, Decreased mobility  Visit Diagnosis: Other abnormalities of gait and mobility     Problem List Patient Active Problem List   Diagnosis Date Noted  . Long term (current) use of anticoagulants [Z79.01] 11/01/2015  . Ischemic stroke of frontal lobe (HCC) 10/15/2015  . Anoxic encephalopathy (HCC) 10/15/2015  . Right-sided cerebrovascular accident (CVA) (HCC)   . Acute on chronic systolic congestive heart failure (HCC)   . HCAP (healthcare-associated pneumonia)   . Dysphagia as late effect of cerebrovascular disease   . Tobacco abuse   . Tachypnea   . Essential hypertension   . Type 2 diabetes mellitus with complication (HCC)   .  Leukocytosis   . Absolute anemia   . Altered mental status   . Hemiplegia (HCC)   . Stroke (HCC)   . ARDS (adult respiratory distress syndrome) (HCC)   . Acute respiratory failure (HCC)   . STEMI (ST elevation myocardial infarction) (HCC) 09/28/2015  . Cardiac arrest (HCC)   . ST elevation myocardial infarction (STEMI) (HCC)   . Encounter for central line placement   . Hypokalemia   . Encephalopathy acute   . Acute hypoxemic respiratory failure (HCC)     Jadakiss Barish L 11/23/2015, 12:31 PM  [facility] [address] [place], [place], [address] Phone: [phone]   Fax:  [phone]  Name: ODIES DESA MRN: [id number] Date of Birth: 1950/09/30    Zerita Boers, PT,DPT 11/23/2015 12:31 PM Phone: [phone] Fax: [phone]

## 2015-11-23 NOTE — Telephone Encounter (Signed)
bp elevation during outpatient rehab.  Pt has appt tomorrow in office for BP re-check. No symptoms, no problems.  Advised to f/u w Belenda Cruise tomorrow at visit to address. Pt aware of recommendations.

## 2015-11-23 NOTE — Patient Instructions (Addendum)
  SWALLOWING EXERCISES 1. Effortful Swallows - Squeeze hard with the muscles in your neck while you swallow your  saliva or a sip of water - Repeat 20 times, 2-3 times a day, and use whenever you eat or drink  2. Masako Swallow - swallow with your tongue sticking out - Stick tongue out and gently bite tongue with your teeth - Swallow, while holding your tongue with your teeth - Repeat 20 times, 2-3 times a day  3. Pitch Raise - Repeat "he", once per second in as high of a pitch as you can - Repeat 20 times, 2-3 times a day  4. Shaker Exercise - head lift - Lie flat on your back in your bed or on a couch without pillows - Raise your head and look at your feet  - KEEP YOUR SHOULDERS DOWN - HOLD FOR 45 SECONDS, then lower your head back down - Repeat 3 times, 2-3 times a day  5. Mendelsohn Maneuver - "half swallow" exercise - Start to swallow, and keep your Adam's apple up by squeezing hard with the muscles of the throat - Hold the squeeze for 5-7 seconds and then relax - Repeat 20 times, 2-3 times a day  6. Tongue Press - Press your entire tongue as hard as you can against the roof of your mouth for 3-5 seconds - Repeat 20 times, 2-3 times a day  7. Tongue Stretch/Teeth Clean - Move your tongue around the pocket between your gums and teeth, clockwise and then counter-clockwise - Repeat on the back side, clockwise and then counter-clockwise - Repeat 15-20 times, 2-3 times a day  8. Breath Hold - Say "HUH!" loudly, holding your breath tightly at the level of your voice box for 3 seconds - Repeat 20 times, 2-3 times a day  9. Chin pushback - Open your mouth  - Place your fist UNDER your chin near your neck, and push back with your fist for 5 seconds  - Repeat 20 times, 2-3 times a day     10. Open mouth swallow          -Swallow with your mouth open wide          -Repeat 15 times, 2 times a day  11. Stick out your tongue and say "GA-GA-GA" loud and shart  -Repeat 25 sets of 3, 2-3 times a day  12. Say ING-GA loud and exaggerated             - 25 times 2-3 times a day  13. Gargle or pretend to gargle             - 10 times for 3-5 seconds (or as long as you can) 2 times a day     

## 2015-11-24 ENCOUNTER — Telehealth: Payer: Self-pay | Admitting: Pharmacist

## 2015-11-24 ENCOUNTER — Ambulatory Visit (INDEPENDENT_AMBULATORY_CARE_PROVIDER_SITE_OTHER): Payer: BLUE CROSS/BLUE SHIELD | Admitting: Pharmacist

## 2015-11-24 DIAGNOSIS — I63311 Cerebral infarction due to thrombosis of right middle cerebral artery: Secondary | ICD-10-CM

## 2015-11-24 DIAGNOSIS — Z7901 Long term (current) use of anticoagulants: Secondary | ICD-10-CM | POA: Diagnosis not present

## 2015-11-24 DIAGNOSIS — I10 Essential (primary) hypertension: Secondary | ICD-10-CM

## 2015-11-24 DIAGNOSIS — I2102 ST elevation (STEMI) myocardial infarction involving left anterior descending coronary artery: Secondary | ICD-10-CM

## 2015-11-24 LAB — POCT INR: INR: 4.3

## 2015-11-24 MED ORDER — LISINOPRIL 5 MG PO TABS: 5.0000 mg | ORAL_TABLET | Freq: Every day | ORAL | Status: DC

## 2015-11-24 MED ORDER — CARVEDILOL 6.25 MG PO TABS: 6.2500 mg | ORAL_TABLET | Freq: Two times a day (BID) | ORAL | Status: DC

## 2015-11-24 MED ORDER — ATORVASTATIN CALCIUM 80 MG PO TABS: 80.0000 mg | ORAL_TABLET | Freq: Every day | ORAL | Status: DC

## 2015-11-24 NOTE — Telephone Encounter (Signed)
Pt seen today for INR - reports that he needs refills on lisinopril, atorvastatin, and carvedilol. His blood pressure today was elevated so will increase his lisinopril to 5mg  daily and recheck BP at next INR check. RXs sent for all medications to [facility].

## 2015-11-25 ENCOUNTER — Ambulatory Visit: Payer: BLUE CROSS/BLUE SHIELD

## 2015-11-25 VITALS — BP 155/98 | HR 76

## 2015-11-25 DIAGNOSIS — R41841 Cognitive communication deficit: Secondary | ICD-10-CM

## 2015-11-25 DIAGNOSIS — R2689 Other abnormalities of gait and mobility: Secondary | ICD-10-CM

## 2015-11-25 DIAGNOSIS — R1313 Dysphagia, pharyngeal phase: Secondary | ICD-10-CM | POA: Diagnosis not present

## 2015-11-25 NOTE — Patient Instructions (Signed)
Memory Strategies  W -write it down A - Associate it (tie it together) with something else R - repeat it M - mental picture

## 2015-11-25 NOTE — Therapy (Signed)
[facility] [address] [place], [place], [address] Phone: [phone]   Fax:  [phone]  Physical Therapy Treatment  Patient Details  Name: Eddie [name] MRN: [id number] Date of Birth: 1951-01-02 Referring Provider: Dr. [name]  Encounter Date: 11/25/2015  NO CHARGE.      PT End of Session - 11/25/15 0909    Visit Number 3  No charge   Number of Visits 9   Date for PT Re-Evaluation 12/02/15   Authorization Type BCBS-30 visit combo limit   PT Start Time 0850  pt with speech   PT Stop Time 0905   PT Time Calculation (min) 15 min   Activity Tolerance Treatment limited secondary to medical complications (Comment)  Incr. BP   Behavior During Therapy [facility] for tasks assessed/performed      Past Medical History  Diagnosis Date  . PUD (peptic ulcer disease)   . CAD (coronary artery disease)     a. STEMI 09/2015 w/ DES to Prox LAD  . CVA (cerebral infarction)     a. 09/2015: acute small right frontal lobe infarct    Past Surgical History  Procedure Laterality Date  . Repair of peptic ulcer    . Cardiac catheterization N/A 09/28/2015    Procedure: Left Heart Cath and Coronary Angiography;  Surgeon: Runell Gess, MD;  Location: [facility] INVASIVE CV LAB;  Service: Cardiovascular;  Laterality: N/A;  . Cardiac catheterization N/A 10/08/2015    Procedure: Left Heart Cath and Coronary Angiography;  Surgeon: Lyn Records, MD;  Location: [facility] INVASIVE CV LAB;  Service: Cardiovascular;  Laterality: N/A;    Filed Vitals:   11/25/15 0854 11/25/15 0903  BP: 158/102 155/98  Pulse: 77 76  BP assessed at rest.      Subjective Assessment - 11/25/15 0853    Subjective Pt reported he informed cardiology office of elevated BP and need for refills, they saw pt yesterday and BP was still elevated. Cardiology incr. BP med and pt will pick it up today. Pt denied falls since last visit. Pt reported he took former dose of Lisinopril  this AM.   Patient is accompained by: Family member   Pertinent History MI, HTN, DM, CAD, L eye blindness   Patient Stated Goals Walk without cane, wash car without losing balance   Currently in Pain? No/denies                                   PT Short Term Goals - 11/02/15 1300    PT SHORT TERM GOAL #1   Title same as LTGs           PT Long Term Goals - 11/19/15 1253    PT LONG TERM GOAL #1   Title Pt will be IND in HEP to improve balance and strength. Target date: 11/30/15   Status On-going   PT LONG TERM GOAL #2   Title Pt will improve FGA score to >/=25/30 to decr. falls risk. Target date: 11/30/15   Status On-going   PT LONG TERM GOAL #3   Title Pt will amb. 300' IND without AD, over uneven terrain (sand, grass), in order to improve functional mobility and to go to the beach this summer/fall. Target date: 11/30/15   Status On-going   PT LONG TERM GOAL #4   Title Pt will amb. 1000' over paved surfaces (even) IND, no AD, in order to  walk dog and improve functional mobility. . Target date: 11/30/15   Status On-going   PT LONG TERM GOAL #5   Title Pt will verbalize walking program and potentially how to progress to light jogging based on progress, in order to walk/jog with dog. Target date: 11/30/15   Status On-going               Plan - 11/25/15 0910    Clinical Impression Statement No charge for visit, as PT assessed BP prior to beginning session and it was elevated (especially diastolic) at rest and would likely incr. With physical activity. Pt reported he took former dose of Lisinopril this AM(2.5mg  vs. newly prescribed ). Pt denied sx's. PT encouraged pt and his wife to notify MD and to begin new dosage as prescribed by MD, in order to manage BP. Due to pt's history of CVA and MI, PT placed pt on hold until BP is managed.    Rehab Potential Good   Clinical Impairments Affecting Rehab Potential co-morbidities   PT Frequency 2x / week   PT  Duration 4 weeks   PT Treatment/Interventions ADLs/Self Care Home Management;Biofeedback;Canalith Repostioning;Therapeutic activities;Therapeutic exercise;Balance training;Manual techniques;Vestibular;Orthotic Fit/Training;Functional mobility training;Stair training;Gait training;DME Instruction;Patient/family education;Cognitive remediation   PT Next Visit Plan Check BP, and continue gait without AD. Check goals and renew if appropriate.   PT Home Exercise Plan Strengthening, balance, and flexibility HEP   Consulted and Agree with Plan of Care Patient;Family member/caregiver   Family Member Consulted pt's wife: [name]      Patient will benefit from skilled therapeutic intervention in order to improve the following deficits and impairments:  Abnormal gait, Decreased endurance, Decreased knowledge of use of DME, Decreased strength, Impaired flexibility, Decreased cognition, Decreased balance, Decreased mobility  Visit Diagnosis: Other abnormalities of gait and mobility     Problem List Patient Active Problem List   Diagnosis Date Noted  . Long term (current) use of anticoagulants [Z79.01] 11/01/2015  . Ischemic stroke of frontal lobe (HCC) 10/15/2015  . Anoxic encephalopathy (HCC) 10/15/2015  . Right-sided cerebrovascular accident (CVA) (HCC)   . Acute on chronic systolic congestive heart failure (HCC)   . HCAP (healthcare-associated pneumonia)   . Dysphagia as late effect of cerebrovascular disease   . Tobacco abuse   . Tachypnea   . Essential hypertension   . Type 2 diabetes mellitus with complication (HCC)   . Leukocytosis   . Absolute anemia   . Altered mental status   . Hemiplegia (HCC)   . Stroke (HCC)   . ARDS (adult respiratory distress syndrome) (HCC)   . Acute respiratory failure (HCC)   . STEMI (ST elevation myocardial infarction) (HCC) 09/28/2015  . Cardiac arrest (HCC)   . ST elevation myocardial infarction (STEMI) (HCC)   . Encounter for central line placement    . Hypokalemia   . Encephalopathy acute   . Acute hypoxemic respiratory failure (HCC)     Davyon Fisch L 11/25/2015, 9:12 AM  [facility] [address] [place], [place], [address] Phone: [phone]   Fax:  [phone]  Name: Eddie [name] MRN: [id number] Date of Birth: 1951/04/05    Zerita Boers, PT,DPT 11/25/2015 9:12 AM Phone: [phone] Fax: [phone]

## 2015-11-25 NOTE — Therapy (Signed)
[facility] [address] [place], [place], [address] Phone: [phone]   Fax:  [phone]  Speech Language Pathology Treatment  Patient Details  Name: Eddie [name] MRN: [id number] Date of Birth: 1950/10/15 Referring Provider: Claudette Laws, MD  Encounter Date: 11/25/2015      End of Session - 11/25/15 0919    Visit Number 4   Number of Visits 16   Authorization - Visit Number 4   Authorization - Number of Visits 30   SLP Start Time [phone]   SLP Stop Time  0846  pt arrived late   SLP Time Calculation (min) 34 min   Activity Tolerance Patient tolerated treatment well      Past Medical History  Diagnosis Date  . PUD (peptic ulcer disease)   . CAD (coronary artery disease)     a. STEMI 09/2015 w/ DES to Prox LAD  . CVA (cerebral infarction)     a. 09/2015: acute small right frontal lobe infarct    Past Surgical History  Procedure Laterality Date  . Repair of peptic ulcer    . Cardiac catheterization N/A 09/28/2015    Procedure: Left Heart Cath and Coronary Angiography;  Surgeon: Runell Gess, MD;  Location: [facility] INVASIVE CV LAB;  Service: Cardiovascular;  Laterality: N/A;  . Cardiac catheterization N/A 10/08/2015    Procedure: Left Heart Cath and Coronary Angiography;  Surgeon: Lyn Records, MD;  [facility];  Service: Cardiovascular;  Laterality: N/A;    There were no vitals filed for this visit.      Subjective Assessment - 11/25/15 0813    Subjective Pt reports he's been practicing the exercises "every night".   Currently in Pain? No/denies               ADULT SLP TREATMENT - 11/25/15 0815    General Information   Behavior/Cognition Alert;Cooperative;Pleasant mood   Treatment Provided   Treatment provided Dysphagia   Dysphagia Treatment   Temperature Spikes Noted No   Respiratory Status Room air   Oral Cavity - Dentition Missing dentition   Treatment Methods  Therapeutic exercise;Patient/caregiver education   Patient observed directly with PO's No   Type of PO's observed Dysphagia 3 (soft);Pudding-thick liquids   Liquids provided via Cup   Other treatment/comments SLP provided pt mod-max cues consistently for HEP completion - wife stated she is with pt and assists him as needed.   Cognitive-Linquistic Treatment   Treatment focused on Cognition   Skilled Treatment SLP educated pt and wife on memory strategies. Ten minutes later SLP asked pt to recall memory strategies and pt unable to do so. SLP told pt he should have written them down in order to recall them. Reduced anticpatory awareness noted.   Assessment / Recommendations / Plan   Plan Continue with current plan of care   Dysphagia Recommendations   Diet recommendations Dysphagia 3 (mechanical soft);Pudding-thick liquid   Medication Administration Whole meds with puree   Supervision Intermittent supervision to cue for compensatory strategies   Compensations Slow rate;Small sips/bites;Multiple dry swallows after each bite/sip;Follow solids with liquid;Clear throat intermittently   Progression Toward Goals   Progression toward goals Progressing toward goals          SLP Education - 11/25/15 0919    Education provided Yes   Education Details memory strategies   Person(s) Educated Patient;Spouse   Methods Explanation;Demonstration;Handout   Comprehension Verbalized understanding;Verbal cues required;Need further instruction  SLP Short Term Goals - 11/25/15 1610    SLP SHORT TERM GOAL #1   Title pt will perform HEP for swallowing with occasional min A   Time 3   Period Weeks   Status On-going   SLP SHORT TERM GOAL #2   Title pt will demo swallow precuations with POS with rare min A   Time 3   Period Weeks   Status On-going   SLP SHORT TERM GOAL #3   Title pt will state 3/4  memory compensations over 3 sessions   Time 3   Period Weeks   Status On-going           SLP Long Term Goals - 11/25/15 9604    SLP LONG TERM GOAL #1   Title pt will complete HEP for swallowing with rare min A   Time 7   Period Weeks   Status On-going   SLP LONG TERM GOAL #2   Title pt will tell SLP how using memory strategies could benefit him   Time 7   Period Weeks   Status On-going   SLP LONG TERM GOAL #3   Title pt will demo swallowing precautions with POs with modified independence   Time 7   Period Weeks   Status On-going          Plan - 11/25/15 0920    Clinical Impression Statement Swallowing HEP req'd mod-max A faded to min-mod A. Spouse reports she is there with pt to correct PRN. Continue skilled ST to maximize safety of swallow. If voice continues to improve and pt with success with HEP, consider repeat MBSS in 2 weeks.   Speech Therapy Frequency 2x / week   Duration --  7 weeks   Treatment/Interventions Aspiration precaution training;Pharyngeal strengthening exercises;Diet toleration management by SLP;Cognitive reorganization;SLP instruction and feedback;Compensatory strategies;Internal/external aids;Patient/family education;Functional tasks;Cueing hierarchy   Potential to Achieve Goals Fair   Potential Considerations Severity of impairments   Consulted and Agree with Plan of Care Patient;Family member/caregiver   Family Member Consulted wife, [name]      Patient will benefit from skilled therapeutic intervention in order to improve the following deficits and impairments:   Dysphagia, pharyngeal phase  Cognitive communication deficit    Problem List Patient Active Problem List   Diagnosis Date Noted  . Long term (current) use of anticoagulants [Z79.01] 11/01/2015  . Ischemic stroke of frontal lobe (HCC) 10/15/2015  . Anoxic encephalopathy (HCC) 10/15/2015  . Right-sided cerebrovascular accident (CVA) (HCC)   . Acute on chronic systolic congestive heart failure (HCC)   . HCAP (healthcare-associated pneumonia)   . Dysphagia as late effect of  cerebrovascular disease   . Tobacco abuse   . Tachypnea   . Essential hypertension   . Type 2 diabetes mellitus with complication (HCC)   . Leukocytosis   . Absolute anemia   . Altered mental status   . Hemiplegia (HCC)   . Stroke (HCC)   . ARDS (adult respiratory distress syndrome) (HCC)   . Acute respiratory failure (HCC)   . STEMI (ST elevation myocardial infarction) (HCC) 09/28/2015  . Cardiac arrest (HCC)   . ST elevation myocardial infarction (STEMI) (HCC)   . Encounter for central line placement   . Hypokalemia   . Encephalopathy acute   . Acute hypoxemic respiratory failure (HCC)     SCHINKE,CARL ,MS, CCC-SLP  11/25/2015, 9:23 AM  [facility] [address] [place], [place], [address] Phone: [phone]   Fax:  [phone]   Name: TYMERE DEPUY MRN: [id number] Date of Birth: 12-08-1950

## 2015-11-30 ENCOUNTER — Encounter: Payer: Self-pay | Admitting: Physical Therapy

## 2015-11-30 ENCOUNTER — Ambulatory Visit: Payer: BLUE CROSS/BLUE SHIELD | Admitting: Physical Therapy

## 2015-11-30 ENCOUNTER — Ambulatory Visit: Payer: BLUE CROSS/BLUE SHIELD | Admitting: Speech Pathology

## 2015-11-30 VITALS — BP 173/102 | HR 94

## 2015-11-30 DIAGNOSIS — R2689 Other abnormalities of gait and mobility: Secondary | ICD-10-CM

## 2015-11-30 DIAGNOSIS — R1313 Dysphagia, pharyngeal phase: Secondary | ICD-10-CM

## 2015-11-30 DIAGNOSIS — R41841 Cognitive communication deficit: Secondary | ICD-10-CM

## 2015-11-30 NOTE — Therapy (Signed)
[facility] [address] [place], [place], [address] Phone: [phone]   Fax:  [phone]  Physical Therapy Treatment  Patient Details  Name: Eddie [name] MRN: [id number] Date of Birth: 06/06/1951 Referring Provider: Dr. [name]  Encounter Date: 11/30/2015      PT End of Session - 11/30/15 1334    Visit Number 3  No charge   Number of Visits 9   Date for PT Re-Evaluation 12/02/15   Authorization Type BCBS-30 visit combo limit   PT Start Time 1316   PT Stop Time 1323   PT Time Calculation (min) 7 min   Activity Tolerance Treatment limited secondary to medical complications (Comment)  Incr. BP   Behavior During Therapy [facility] for tasks assessed/performed      Past Medical History  Diagnosis Date  . PUD (peptic ulcer disease)   . CAD (coronary artery disease)     a. STEMI 09/2015 w/ DES to Prox LAD  . CVA (cerebral infarction)     a. 09/2015: acute small right frontal lobe infarct    Past Surgical History  Procedure Laterality Date  . Repair of peptic ulcer    . Cardiac catheterization N/A 09/28/2015    Procedure: Left Heart Cath and Coronary Angiography;  Surgeon: Runell Gess, MD;  Location: [facility] INVASIVE CV LAB;  Service: Cardiovascular;  Laterality: N/A;  . Cardiac catheterization N/A 10/08/2015    Procedure: Left Heart Cath and Coronary Angiography;  Surgeon: Lyn Records, MD;  Location: [facility] INVASIVE CV LAB;  Service: Cardiovascular;  Laterality: N/A;    Filed Vitals:   11/30/15 1320  BP: 173/102  Pulse: 94        Subjective Assessment - 11/30/15 1320    Subjective No new complaints. No falls or pain to report. Has not increased the BP medication due to financial contraints (waiting on moneny to get in back so he can pick it up),    Patient is accompained by: Family member   Pertinent History MI, HTN, DM, CAD, L eye blindness   Patient Stated Goals Walk without cane, wash car without losing  balance   Currently in Pain? No/denies   Pain Score 0-No pain             PT Short Term Goals - 11/02/15 1300    PT SHORT TERM GOAL #1   Title same as LTGs           PT Long Term Goals - 11/19/15 1253    PT LONG TERM GOAL #1   Title Pt will be IND in HEP to improve balance and strength. Target date: 11/30/15   Status On-going   PT LONG TERM GOAL #2   Title Pt will improve FGA score to >/=25/30 to decr. falls risk. Target date: 11/30/15   Status On-going   PT LONG TERM GOAL #3   Title Pt will amb. 300' IND without AD, over uneven terrain (sand, grass), in order to improve functional mobility and to go to the beach this summer/fall. Target date: 11/30/15   Status On-going   PT LONG TERM GOAL #4   Title Pt will amb. 1000' over paved surfaces (even) IND, no AD, in order to walk dog and improve functional mobility. . Target date: 11/30/15   Status On-going   PT LONG TERM GOAL #5   Title Pt will verbalize walking program and potentially how to progress to light jogging based on progress, in order to walk/jog with dog.  Target date: 11/30/15   Status On-going            Plan - 11/30/15 1334    Clinical Impression Statement Pt's BP continues to be elevated today, most likely due to not starting high dosage of BP meds that Md ordered. Has not started higher dosage due to not having the money to afford the medication. See's his primary Md tomorrow. Discussed talking with his MD about getting a home BP machine as his does not work and to inquire if his MD has information on prescription assistance programs that may be able to help him get his medicine on time. Pt and spouse verbalized understanding. No charge for today, session cancelled due to BP.                                                                 Rehab Potential Good   Clinical Impairments Affecting Rehab Potential co-morbidities   PT Frequency 2x / week   PT Duration 4 weeks   PT Treatment/Interventions ADLs/Self Care Home  Management;Biofeedback;Canalith Repostioning;Therapeutic activities;Therapeutic exercise;Balance training;Manual techniques;Vestibular;Orthotic Fit/Training;Functional mobility training;Stair training;Gait training;DME Instruction;Patient/family education;Cognitive remediation   PT Next Visit Plan Check BP, and continue gait without AD. Check goals and renew if appropriate.   PT Home Exercise Plan Strengthening, balance, and flexibility HEP   Consulted and Agree with Plan of Care Patient;Family member/caregiver   Family Member Consulted pt's wife: [name]      Patient will benefit from skilled therapeutic intervention in order to improve the following deficits and impairments:  Abnormal gait, Decreased endurance, Decreased knowledge of use of DME, Decreased strength, Impaired flexibility, Decreased cognition, Decreased balance, Decreased mobility  Visit Diagnosis: Other abnormalities of gait and mobility     Problem List Patient Active Problem List   Diagnosis Date Noted  . Long term (current) use of anticoagulants [Z79.01] 11/01/2015  . Ischemic stroke of frontal lobe (HCC) 10/15/2015  . Anoxic encephalopathy (HCC) 10/15/2015  . Right-sided cerebrovascular accident (CVA) (HCC)   . Acute on chronic systolic congestive heart failure (HCC)   . HCAP (healthcare-associated pneumonia)   . Dysphagia as late effect of cerebrovascular disease   . Tobacco abuse   . Tachypnea   . Essential hypertension   . Type 2 diabetes mellitus with complication (HCC)   . Leukocytosis   . Absolute anemia   . Altered mental status   . Hemiplegia (HCC)   . Stroke (HCC)   . ARDS (adult respiratory distress syndrome) (HCC)   . Acute respiratory failure (HCC)   . STEMI (ST elevation myocardial infarction) (HCC) 09/28/2015  . Cardiac arrest (HCC)   . ST elevation myocardial infarction (STEMI) (HCC)   . Encounter for central line placement   . Hypokalemia   . Encephalopathy acute   . Acute hypoxemic  respiratory failure (HCC)     Sallyanne Kuster, PTA, [facility] [address] [place], [place] [address] [phone] 11/30/2015, 1:37 PM   Name: Eddie [name] MRN: [id number] Date of Birth: 27-Apr-1951

## 2015-11-30 NOTE — Patient Instructions (Signed)
  Modified Barium Swallow Study    Tuesday May 16 Swallow Study at [facility]  Go to Admitting on 2nd floor at 11:15 to check in  Eddie [name] is at 11:30  [phone] Speech dept at [facility]

## 2015-11-30 NOTE — Therapy (Signed)
[facility] [address] [place], [place], [address] Phone: [phone]   Fax:  [phone]  Speech Language Pathology Treatment  Patient Details  Name: Eddie [name] MRN: [id number] Date of Birth: 1951/07/03 Referring Provider: Claudette Laws, MD  Encounter Date: 11/30/2015      End of Session - 11/30/15 1449    Visit Number 5   Number of Visits 16   Authorization - Visit Number 5   Authorization - Number of Visits 30   SLP Start Time 1403   SLP Stop Time  1447   SLP Time Calculation (min) 44 min      Past Medical History  Diagnosis Date  . PUD (peptic ulcer disease)   . CAD (coronary artery disease)     a. STEMI 09/2015 w/ DES to Prox LAD  . CVA (cerebral infarction)     a. 09/2015: acute small right frontal lobe infarct    Past Surgical History  Procedure Laterality Date  . Repair of peptic ulcer    . Cardiac catheterization N/A 09/28/2015    Procedure: Left Heart Cath and Coronary Angiography;  Surgeon: Runell Gess, MD;  Location: [facility] INVASIVE CV LAB;  Service: Cardiovascular;  Laterality: N/A;  . Cardiac catheterization N/A 10/08/2015    Procedure: Left Heart Cath and Coronary Angiography;  Surgeon: Lyn Records, MD;  Location: [facility] INVASIVE CV LAB;  Service: Cardiovascular;  Laterality: N/A;    There were no vitals filed for this visit.      Subjective Assessment - 11/30/15 1408    Subjective "I have been practicing - my memory is good"   Patient is accompained by: Family member               ADULT SLP TREATMENT - 11/30/15 1409    General Information   Behavior/Cognition Alert;Cooperative;Pleasant mood   Treatment Provided   Treatment provided Dysphagia   Dysphagia Treatment   Temperature Spikes Noted No   Respiratory Status Room air   Oral Cavity - Dentition Missing dentition   Treatment Methods Therapeutic exercise;Patient/caregiver education   Patient observed directly with  PO's Yes   Type of PO's observed Dysphagia 1 (puree)   Liquids provided via Teaspoon   Type of cueing Verbal   Other treatment/comments rare min verbal cues for swallow protocol. Pt performed HEP with rare to occasional min A.    Pain Assessment   Pain Assessment No/denies pain   Cognitive-Linquistic Treatment   Treatment focused on Cognition   Skilled Treatment Pt recalled 1/4 memory strategies, with min questioning cues recalled 2/4.  Pt recalled prior session conversations with  min questioning cues.    Assessment / Recommendations / Plan   Plan Continue with current plan of care   Dysphagia Recommendations   Diet recommendations Dysphagia 3 (mechanical soft)   Liquids provided via Cup   Medication Administration Whole meds with puree   Supervision Intermittent supervision to cue for compensatory strategies   Compensations Slow rate;Small sips/bites;Multiple dry swallows after each bite/sip;Follow solids with liquid;Clear throat intermittently   Progression Toward Goals   Progression toward goals Progressing toward goals            SLP Short Term Goals - 11/30/15 1449    SLP SHORT TERM GOAL #1   Title pt will perform HEP for swallowing with occasional min A   Time 2   Period Weeks   Status Achieved   SLP SHORT TERM GOAL #2   Title pt  will demo swallow precuations with POS with rare min A   Time 2   Period Weeks   Status Achieved   SLP SHORT TERM GOAL #3   Title pt will state 3/4  memory compensations over 3 sessions   Time 2   Period Weeks   Status On-going          SLP Long Term Goals - 11/30/15 1449    SLP LONG TERM GOAL #1   Title pt will complete HEP for swallowing with rare min A   Time 6   Period Weeks   Status On-going   SLP LONG TERM GOAL #2   Title pt will tell SLP how using memory strategies could benefit him   Time 6   Period Weeks   Status On-going   SLP LONG TERM GOAL #3   Title pt will demo swallowing precautions with POs with modified  independence   Time 6   Period Weeks   Status On-going          Plan - 11/30/15 1447    Clinical Impression Statement Pt completed HEP for swallowing with rare min A. Voice quality improved. Pt followed swallow protocol with rare min A. Due to overall improvement and clinical gains and highly restrictive diet,  And h/o silent aspiration,MBSS next week is recommended. MBSS scheduled for 12/07/15 at [facility].    Speech Therapy Frequency 2x / week   Treatment/Interventions Aspiration precaution training;Pharyngeal strengthening exercises;Diet toleration management by SLP;Cognitive reorganization;SLP instruction and feedback;Compensatory strategies;Internal/external aids;Patient/family education;Functional tasks;Cueing hierarchy   Potential to Achieve Goals Fair   Potential Considerations Severity of impairments      Patient will benefit from skilled therapeutic intervention in order to improve the following deficits and impairments:   Dysphagia, pharyngeal phase - Plan: SLP modified barium swallow  Cognitive communication deficit    Problem List Patient Active Problem List   Diagnosis Date Noted  . Long term (current) use of anticoagulants [Z79.01] 11/01/2015  . Ischemic stroke of frontal lobe (HCC) 10/15/2015  . Anoxic encephalopathy (HCC) 10/15/2015  . Right-sided cerebrovascular accident (CVA) (HCC)   . Acute on chronic systolic congestive heart failure (HCC)   . HCAP (healthcare-associated pneumonia)   . Dysphagia as late effect of cerebrovascular disease   . Tobacco abuse   . Tachypnea   . Essential hypertension   . Type 2 diabetes mellitus with complication (HCC)   . Leukocytosis   . Absolute anemia   . Altered mental status   . Hemiplegia (HCC)   . Stroke (HCC)   . ARDS (adult respiratory distress syndrome) (HCC)   . Acute respiratory failure (HCC)   . STEMI (ST elevation myocardial infarction) (HCC) 09/28/2015  . Cardiac arrest (HCC)   . ST elevation myocardial  infarction (STEMI) (HCC)   . Encounter for central line placement   . Hypokalemia   . Encephalopathy acute   . Acute hypoxemic respiratory failure (HCC)     Lovvorn, Radene Journey MS, CCC-SLP 11/30/2015, 2:50 PM  [facility] [address] [place], [place], [address] Phone: [phone]   Fax:  [phone]   Name: Eddie [name] MRN: [id number] Date of Birth: 29-Mar-1951

## 2015-12-01 ENCOUNTER — Ambulatory Visit (INDEPENDENT_AMBULATORY_CARE_PROVIDER_SITE_OTHER): Payer: BLUE CROSS/BLUE SHIELD | Admitting: Pharmacist

## 2015-12-01 ENCOUNTER — Other Ambulatory Visit (HOSPITAL_COMMUNITY): Payer: Self-pay | Admitting: Physical Medicine & Rehabilitation

## 2015-12-01 DIAGNOSIS — Z7901 Long term (current) use of anticoagulants: Secondary | ICD-10-CM | POA: Diagnosis not present

## 2015-12-01 DIAGNOSIS — R1310 Dysphagia, unspecified: Secondary | ICD-10-CM

## 2015-12-01 DIAGNOSIS — I63311 Cerebral infarction due to thrombosis of right middle cerebral artery: Secondary | ICD-10-CM

## 2015-12-01 LAB — POCT INR: INR: 2.1

## 2015-12-02 ENCOUNTER — Ambulatory Visit: Payer: BLUE CROSS/BLUE SHIELD

## 2015-12-02 ENCOUNTER — Other Ambulatory Visit: Payer: Self-pay | Admitting: *Deleted

## 2015-12-02 VITALS — BP 154/100 | HR 80

## 2015-12-02 DIAGNOSIS — I10 Essential (primary) hypertension: Secondary | ICD-10-CM

## 2015-12-02 DIAGNOSIS — R2689 Other abnormalities of gait and mobility: Secondary | ICD-10-CM

## 2015-12-02 DIAGNOSIS — I2102 ST elevation (STEMI) myocardial infarction involving left anterior descending coronary artery: Secondary | ICD-10-CM

## 2015-12-02 DIAGNOSIS — R41841 Cognitive communication deficit: Secondary | ICD-10-CM

## 2015-12-02 DIAGNOSIS — R1313 Dysphagia, pharyngeal phase: Secondary | ICD-10-CM | POA: Diagnosis not present

## 2015-12-02 DIAGNOSIS — R498 Other voice and resonance disorders: Secondary | ICD-10-CM

## 2015-12-02 MED ORDER — LISINOPRIL 10 MG PO TABS
10.0000 mg | ORAL_TABLET | Freq: Every day | ORAL | Status: DC
Start: 2015-12-02 — End: 2015-12-08

## 2015-12-02 NOTE — Therapy (Signed)
[facility] [address] [place], [place], [address] Phone: [phone]   Fax:  [phone]  Physical Therapy Treatment  Patient Details  Name: Eddie [name] MRN: [id number] Date of Birth: 06/15/1951 Referring Provider: Dr. [name]  Encounter Date: 12/02/2015  NO CHARGE      PT End of Session - 12/02/15 0830    Visit Number 3  No charge   Number of Visits 9   Date for PT Re-Evaluation 12/02/15   Authorization Type [employer]-30 visit combo limit   PT Start Time 0806  pt arrived late 2/2 traffic   PT Stop Time 0821   PT Time Calculation (min) 15 min   Activity Tolerance Treatment limited secondary to medical complications (Comment)  incr. BP   Behavior During Therapy [facility] for tasks assessed/performed      Past Medical History  Diagnosis Date  . PUD (peptic ulcer disease)   . CAD (coronary artery disease)     a. STEMI 09/2015 w/ DES to Prox LAD  . CVA (cerebral infarction)     a. 09/2015: acute small right frontal lobe infarct    Past Surgical History  Procedure Laterality Date  . Repair of peptic ulcer    . Cardiac catheterization N/A 09/28/2015    Procedure: Left Heart Cath and Coronary Angiography;  Surgeon: Runell Gess, MD;  Location: [facility] INVASIVE CV LAB;  Service: Cardiovascular;  Laterality: N/A;  . Cardiac catheterization N/A 10/08/2015    Procedure: Left Heart Cath and Coronary Angiography;  Surgeon: Lyn Records, MD;  Location: [facility] INVASIVE CV LAB;  Service: Cardiovascular;  Laterality: N/A;    Filed Vitals:   12/02/15 0810 12/02/15 0820  BP: 166/111 154/100  Pulse: 82 80  BP at rest, in seated position.      Subjective Assessment - 12/02/15 0808    Subjective Pt reported he informed MD about incr. cost of medication and he is now on a prescription plan.   Patient is accompained by: Family member   Pertinent History MI, HTN, DM, CAD, L eye blindness   Patient Stated Goals Walk without cane,  wash car without losing balance   Currently in Pain? No/denies                                 PT Education - 12/02/15 0810    Education provided Yes   Education Details PT discussed the importance of taking meds as prescribed and discussed potential referral to [facility]. PT discussed holding PT until pt's appt. with cardiologist to manage BP and that PT will need paramenters to continue therapy in order to ensure pt safety.   Person(s) Educated Patient;Spouse   Methods Explanation   Comprehension Verbalized understanding          PT Short Term Goals - 11/02/15 1300    PT SHORT TERM GOAL #1   Title same as LTGs           PT Long Term Goals - 11/19/15 1253    PT LONG TERM GOAL #1   Title Pt will be IND in HEP to improve balance and strength. Target date: 11/30/15   Status On-going   PT LONG TERM GOAL #2   Title Pt will improve FGA score to >/=25/30 to decr. falls risk. Target date: 11/30/15   Status On-going   PT LONG TERM GOAL #3   Title Pt will amb. 300' IND without  AD, over uneven terrain (sand, grass), in order to improve functional mobility and to go to the beach this summer/fall. Target date: 11/30/15   Status On-going   PT LONG TERM GOAL #4   Title Pt will amb. 1000' over paved surfaces (even) IND, no AD, in order to walk dog and improve functional mobility. . Target date: 11/30/15   Status On-going   PT LONG TERM GOAL #5   Title Pt will verbalize walking program and potentially how to progress to light jogging based on progress, in order to walk/jog with dog. Target date: 11/30/15   Status On-going               Plan - 12/02/15 0831    Clinical Impression Statement PT did not charge today, due to pt's BP still elevated at rest, despite pt stating he's taking incr. dose of BP meds as prescribed. PT spoke with speech therapist and he is also aware of pt's incr. BP. PT will hold therapy until pt's appt. with cardiologist and PCP next week, in order to  allow management of BP and for MD to set parameters.   Rehab Potential Good   Clinical Impairments Affecting Rehab Potential co-morbidities   PT Frequency 2x / week   PT Duration 4 weeks   PT Treatment/Interventions ADLs/Self Care Home Management;Biofeedback;Canalith Repostioning;Therapeutic activities;Therapeutic exercise;Balance training;Manual techniques;Vestibular;Orthotic Fit/Training;Functional mobility training;Stair training;Gait training;DME Instruction;Patient/family education;Cognitive remediation   PT Next Visit Plan Check BP, Check goals and renew if appropriate.   PT Home Exercise Plan Strengthening, balance, and flexibility HEP   Consulted and Agree with Plan of Care Patient;Family member/caregiver   Family Member Consulted pt's wife: [name]      Patient will benefit from skilled therapeutic intervention in order to improve the following deficits and impairments:  Abnormal gait, Decreased endurance, Decreased knowledge of use of DME, Decreased strength, Impaired flexibility, Decreased cognition, Decreased balance, Decreased mobility  Visit Diagnosis: Other abnormalities of gait and mobility     Problem List Patient Active Problem List   Diagnosis Date Noted  . Long term (current) use of anticoagulants [Z79.01] 11/01/2015  . Ischemic stroke of frontal lobe (HCC) 10/15/2015  . Anoxic encephalopathy (HCC) 10/15/2015  . Right-sided cerebrovascular accident (CVA) (HCC)   . Acute on chronic systolic congestive heart failure (HCC)   . HCAP (healthcare-associated pneumonia)   . Dysphagia as late effect of cerebrovascular disease   . Tobacco abuse   . Tachypnea   . Essential hypertension   . Type 2 diabetes mellitus with complication (HCC)   . Leukocytosis   . Absolute anemia   . Altered mental status   . Hemiplegia (HCC)   . Stroke (HCC)   . ARDS (adult respiratory distress syndrome) (HCC)   . Acute respiratory failure (HCC)   . STEMI (ST elevation myocardial  infarction) (HCC) 09/28/2015  . Cardiac arrest (HCC)   . ST elevation myocardial infarction (STEMI) (HCC)   . Encounter for central line placement   . Hypokalemia   . Encephalopathy acute   . Acute hypoxemic respiratory failure (HCC)     Giovanna Kemmerer L 12/02/2015, 8:33 AM  [facility] [address] [place], [place], [address] Phone: [phone]   Fax:  [phone]  Name: ELI WISSE MRN: [id number] Date of Birth: 07/14/1951    Zerita Boers, PT,DPT 12/02/2015 8:33 AM Phone: [phone] Fax: [phone]

## 2015-12-02 NOTE — Telephone Encounter (Signed)
Spoke with pt wife(DPR) letting her know as per Eddie [name] Eddie [name] will needs to increase his lisinopril 10 mg daily, pt voice understanding, Rx for lisinopril 10 mg was send into pt pharmacy

## 2015-12-03 NOTE — Therapy (Signed)
[facility] [address] [place], [place], [address] Phone: [phone]   Fax:  [phone]  Speech Language Pathology Treatment  Patient Details  Name: Eddie [name] MRN: [id number] Date of Birth: Oct 01, 1950 Referring Provider: Claudette Laws, MD  Encounter Date: 12/02/2015      End of Session - 12/02/15 1655    Visit Number --   Number of Visits --   Authorization - Visit Number --   Authorization - Number of Visits --      Past Medical History  Diagnosis Date  . PUD (peptic ulcer disease)   . CAD (coronary artery disease)     a. STEMI 09/2015 w/ DES to Prox LAD  . CVA (cerebral infarction)     a. 09/2015: acute small right frontal lobe infarct    Past Surgical History  Procedure Laterality Date  . Repair of peptic ulcer    . Cardiac catheterization N/A 09/28/2015    Procedure: Left Heart Cath and Coronary Angiography;  Surgeon: Runell Gess, MD;  Location: [facility] INVASIVE CV LAB;  Service: Cardiovascular;  Laterality: N/A;  . Cardiac catheterization N/A 10/08/2015    Procedure: Left Heart Cath and Coronary Angiography;  Surgeon: Lyn Records, MD;  Location: [facility] INVASIVE CV LAB;  Service: Cardiovascular;  Laterality: N/A;    There were no vitals filed for this visit.      Subjective Assessment - 12/02/15 0849    Subjective Pt's BP was elevated - PT was cancelled.   Patient is accompained by: Family member  wife   Currently in Pain? No/denies               ADULT SLP TREATMENT - 12/02/15 0855    General Information   Behavior/Cognition Alert;Cooperative;Pleasant mood   Treatment Provided   Treatment provided Dysphagia   Dysphagia Treatment   Temperature Spikes Noted No   Respiratory Status Room air   Oral Cavity - Dentition Missing dentition   Treatment Methods Therapeutic exercise;Skilled observation   Patient observed directly with PO's Yes   Type of PO's observed Dysphagia 3  (soft);Pudding-thick liquids   Liquids provided via Teaspoon   Type of cueing Verbal;Visual   Other treatment/comments pt unable to explain swallow precautions "chew my food up real good and then swallow it hard - then swallow it again." However with POs, pt was following swallowing precautions except small bites/sips. SLP cued pt for small bites and sips. Wet voice heard x1 without throat clear/reswallow. SLP educated pt re: this and pt performed well with cues.   Cognitive-Linquistic Treatment   Treatment focused on Cognition   Skilled Treatment Pt says his memory is at baseline, wife states pt at baseline "give or take 5%." Pt gave examples of more novel tasks on his to-do list that have been completed WNL (e.g., sewing a zipper). Pt told SLP practical compensations for memory (2/4 were strategies taught by SLP).    Assessment / Recommendations / Plan   Plan Continue with current plan of care   Dysphagia Recommendations   Diet recommendations Dysphagia 3 (mechanical soft);Pudding-thick liquid   Medication Administration Whole meds with puree   Supervision Intermittent supervision to cue for compensatory strategies   Compensations Slow rate;Small sips/bites;Multiple dry swallows after each bite/sip;Follow solids with liquid;Clear throat intermittently   Progression Toward Goals   Progression toward goals Progressing toward goals          SLP Education - 12/02/15 0933    Education provided Yes  Education Details wet voice   Person(s) Educated Patient;Spouse   Methods Explanation;Demonstration   Comprehension Verbalized understanding;Returned demonstration;Verbal cues required          SLP Short Term Goals - 12/02/15 0935    SLP SHORT TERM GOAL #1   Title pt will perform HEP for swallowing with occasional min A   Time 2   Period Weeks   Status Achieved   SLP SHORT TERM GOAL #2   Title pt will demo swallow precuations with POS with rare min A   Time 2   Period Weeks   Status  Achieved   SLP SHORT TERM GOAL #3   Title pt will state 3/4  memory compensations over 3 sessions   Time 2   Period Weeks   Status On-going          SLP Long Term Goals - 12/02/15 0939    SLP LONG TERM GOAL #1   Title pt will complete HEP for swallowing with rare min A   Time 6   Period Weeks   Status On-going   SLP LONG TERM GOAL #2   Title pt will tell SLP how using memory strategies could benefit him   Status Achieved   SLP LONG TERM GOAL #3   Title pt will demo swallowing precautions with POs with modified independence   Time 6   Period Weeks   Status On-going          Plan - 12/02/15 0933    Clinical Impression Statement Voice quality improved. Pt followed swallow protocol with rare mod  A. . MBSS scheduled for 12/07/15 at [facility]. Pt req'd cues for small sips/bites today, and was able to provide compensations for memory today in ST session. He would benfit from skilled ST to cont  for addressing dysphagia consistency with success, as well as to cont to monitor memory skills. Suspect 1-2 more weeks until d/c.   Speech Therapy Frequency 2x / week   Duration --  6 weeks   Treatment/Interventions Aspiration precaution training;Pharyngeal strengthening exercises;Diet toleration management by SLP;Cognitive reorganization;SLP instruction and feedback;Compensatory strategies;Internal/external aids;Patient/family education;Functional tasks;Cueing hierarchy   Potential to Achieve Goals Fair   Potential Considerations Severity of impairments      Patient will benefit from skilled therapeutic intervention in order to improve the following deficits and impairments:   Dysphagia, pharyngeal phase  Cognitive communication deficit  Other voice and resonance disorders    Problem List Patient Active Problem List   Diagnosis Date Noted  . Long term (current) use of anticoagulants [Z79.01] 11/01/2015  . Ischemic stroke of frontal lobe (HCC) 10/15/2015  . Anoxic encephalopathy  (HCC) 10/15/2015  . Right-sided cerebrovascular accident (CVA) (HCC)   . Acute on chronic systolic congestive heart failure (HCC)   . HCAP (healthcare-associated pneumonia)   . Dysphagia as late effect of cerebrovascular disease   . Tobacco abuse   . Tachypnea   . Essential hypertension   . Type 2 diabetes mellitus with complication (HCC)   . Leukocytosis   . Absolute anemia   . Altered mental status   . Hemiplegia (HCC)   . Stroke (HCC)   . ARDS (adult respiratory distress syndrome) (HCC)   . Acute respiratory failure (HCC)   . STEMI (ST elevation myocardial infarction) (HCC) 09/28/2015  . Cardiac arrest (HCC)   . ST elevation myocardial infarction (STEMI) (HCC)   . Encounter for central line placement   . Hypokalemia   . Encephalopathy acute   . Acute hypoxemic  respiratory failure (HCC)     Allycia Pitz ,MS, CCC-SLP  12/03/2015, 3:18 PM  [facility] [address] [place], [place], [address] Phone: [phone]   Fax:  [phone]   Name: DEONDREA MARKOS MRN: [id number] Date of Birth: 01-14-1951

## 2015-12-07 ENCOUNTER — Ambulatory Visit (HOSPITAL_COMMUNITY)
Admission: RE | Admit: 2015-12-07 | Discharge: 2015-12-07 | Disposition: A | Discharge status: Home / Self Care | Payer: BLUE CROSS/BLUE SHIELD | Source: Ambulatory Visit | Attending: Physical Medicine & Rehabilitation | Admitting: Physical Medicine & Rehabilitation

## 2015-12-07 ENCOUNTER — Encounter: Payer: Self-pay | Admitting: Speech Pathology

## 2015-12-07 ENCOUNTER — Ambulatory Visit: Payer: Self-pay

## 2015-12-07 DIAGNOSIS — R1313 Dysphagia, pharyngeal phase: Secondary | ICD-10-CM | POA: Diagnosis not present

## 2015-12-07 DIAGNOSIS — R1310 Dysphagia, unspecified: Secondary | ICD-10-CM

## 2015-12-08 ENCOUNTER — Ambulatory Visit (INDEPENDENT_AMBULATORY_CARE_PROVIDER_SITE_OTHER): Payer: BLUE CROSS/BLUE SHIELD | Admitting: Pharmacist Clinician (PhC)/ Clinical Pharmacy Specialist

## 2015-12-08 ENCOUNTER — Other Ambulatory Visit: Payer: Self-pay | Admitting: Physician Assistant

## 2015-12-08 DIAGNOSIS — Z7901 Long term (current) use of anticoagulants: Secondary | ICD-10-CM | POA: Diagnosis not present

## 2015-12-08 DIAGNOSIS — I63311 Cerebral infarction due to thrombosis of right middle cerebral artery: Secondary | ICD-10-CM

## 2015-12-08 LAB — BASIC METABOLIC PANEL
BUN: 15 mg/dL (ref 7–25)
CHLORIDE: 99 mmol/L (ref 98–110)
CO2: 30 mmol/L (ref 20–31)
Calcium: 8.8 mg/dL (ref 8.6–10.3)
Creat: 1.38 mg/dL — ABNORMAL HIGH (ref 0.70–1.25)
Glucose, Bld: 324 mg/dL — ABNORMAL HIGH (ref 65–99)
POTASSIUM: 4.9 mmol/L (ref 3.5–5.3)
SODIUM: 136 mmol/L (ref 135–146)

## 2015-12-08 LAB — POCT INR: INR: 1.8

## 2015-12-08 MED ORDER — LISINOPRIL 20 MG PO TABS: 20.0000 mg | ORAL_TABLET | Freq: Every day | ORAL | Status: DC

## 2015-12-08 NOTE — Patient Instructions (Signed)
Increase your lisinopril to 20 mg daily - can take 2 of the 10 mg tablets or 4 of the 5 mg tablets.

## 2015-12-09 ENCOUNTER — Encounter: Payer: BLUE CROSS/BLUE SHIELD | Attending: Physical Medicine & Rehabilitation

## 2015-12-09 ENCOUNTER — Ambulatory Visit: Payer: Self-pay

## 2015-12-09 ENCOUNTER — Ambulatory Visit (HOSPITAL_BASED_OUTPATIENT_CLINIC_OR_DEPARTMENT_OTHER): Payer: BLUE CROSS/BLUE SHIELD | Admitting: Physical Medicine & Rehabilitation

## 2015-12-09 ENCOUNTER — Encounter: Payer: Self-pay | Admitting: Physical Medicine & Rehabilitation

## 2015-12-09 VITALS — BP 131/83 | HR 85 | Resp 14

## 2015-12-09 DIAGNOSIS — R1313 Dysphagia, pharyngeal phase: Secondary | ICD-10-CM | POA: Insufficient documentation

## 2015-12-09 DIAGNOSIS — I2510 Atherosclerotic heart disease of native coronary artery without angina pectoris: Secondary | ICD-10-CM | POA: Insufficient documentation

## 2015-12-09 DIAGNOSIS — I69398 Other sequelae of cerebral infarction: Secondary | ICD-10-CM | POA: Insufficient documentation

## 2015-12-09 DIAGNOSIS — I10 Essential (primary) hypertension: Secondary | ICD-10-CM

## 2015-12-09 DIAGNOSIS — R41841 Cognitive communication deficit: Secondary | ICD-10-CM | POA: Diagnosis present

## 2015-12-09 DIAGNOSIS — R200 Anesthesia of skin: Secondary | ICD-10-CM | POA: Insufficient documentation

## 2015-12-09 DIAGNOSIS — K279 Peptic ulcer, site unspecified, unspecified as acute or chronic, without hemorrhage or perforation: Secondary | ICD-10-CM | POA: Insufficient documentation

## 2015-12-09 DIAGNOSIS — R269 Unspecified abnormalities of gait and mobility: Secondary | ICD-10-CM

## 2015-12-09 DIAGNOSIS — I69319 Unspecified symptoms and signs involving cognitive functions following cerebral infarction: Secondary | ICD-10-CM | POA: Diagnosis not present

## 2015-12-09 DIAGNOSIS — I69991 Dysphagia following unspecified cerebrovascular disease: Secondary | ICD-10-CM | POA: Diagnosis not present

## 2015-12-09 DIAGNOSIS — R498 Other voice and resonance disorders: Secondary | ICD-10-CM | POA: Diagnosis present

## 2015-12-09 DIAGNOSIS — R202 Paresthesia of skin: Secondary | ICD-10-CM

## 2015-12-09 NOTE — Patient Instructions (Signed)
We will do testing to evaluate the numbness in Right hand and in your leg

## 2015-12-09 NOTE — Progress Notes (Signed)
Subjective:    Patient ID: Eddie [name], male    DOB: 11/04/1950, 65 y.o.   MRN: [id number] CIR Stroke program admission Admit date: 10/15/2015 Discharge date: 10/26/2015  HPI 65 year old male who had CVA around the time of cardiac catheterization and myocardial infarction. He went through inpatient rehabilitation and now is undergoing outpatientRehabilitation with PT and speech therapy. Speech therapy has been working with swallowing and his swallowing has improved to the point where he can eat most foods and drink regular liquids.He passed his modified barium swallow yesterday. He still is not allowed to eat solids and leaky vegetables. His cognition is not back to his baseline. Still working on balance as well. 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  He continues to follow up with cardiology. He has seen his new primary care physician at [facility] He is not driving He is independent with dressing bathing and ambulates with a cane  Patient is complaining of bilateral foot numbness as well as right fourth and fifth digit numbness Pain Inventory Average Pain 0 Pain Right Now 0 My pain is no pain  In the last 24 hours, has pain interfered with the following? General activity 0 Relation with others 0 Enjoyment of life 0 What TIME of day is your pain at its worst? no pain Sleep (in general) Fair  Pain is worse with: no pain Pain improves with: no pain Relief from Meds: no pain  Mobility walk with assistance use a cane how many minutes can you walk? 30 ability to climb steps?  no do you drive?  no  Function not employed: date last employed march 2017  Neuro/Psych bladder control problems bowel control problems weakness trouble walking  Prior Studies Any changes since last visit?  no  Physicians involved in your care Any changes since last visit?  no   History reviewed. No pertinent family history. Social History   Social  History  . Marital Status: Married    Spouse Name: N/A  . Number of Children: N/A  . Years of Education: N/A   Social History Main Topics  . Smoking status: Current Every Day Smoker -- 0.25 packs/day  . Smokeless tobacco: None  . Alcohol Use: No  . Drug Use: None  . Sexual Activity: Not Asked   Other Topics Concern  . None   Social History Narrative   Past Surgical History  Procedure Laterality Date  . Repair of peptic ulcer    . Cardiac catheterization N/A 09/28/2015    Procedure: Left Heart Cath and Coronary Angiography;  Surgeon: Runell Gess, MD;  Location: [facility] INVASIVE CV LAB;  Service: Cardiovascular;  Laterality: N/A;  . Cardiac catheterization N/A 10/08/2015    Procedure: Left Heart Cath and Coronary Angiography;  Surgeon: Lyn Records, MD;  Location: [facility] INVASIVE CV LAB;  Service: Cardiovascular;  Laterality: N/A;   Past Medical History  Diagnosis Date  . PUD (peptic ulcer disease)   . CAD (coronary artery disease)     a. STEMI 09/2015 w/ DES to Prox LAD  . CVA (cerebral infarction)     a. 09/2015: acute small right frontal lobe infarct   BP 131/83 mmHg  Pulse 85  Resp 14  SpO2 98%  Opioid Risk Score:   Fall Risk Score:  `1  Depression screen PHQ 2/9  Depression screen PHQ 2/9 11/09/2015  Decreased Interest 0  Down, Depressed, Hopeless 0  PHQ - 2 Score 0  Review of Systems  Constitutional: Positive for appetite change.  Respiratory: Positive for cough.   Endocrine:       High blood sugar  Genitourinary: Positive for difficulty urinating.  All other systems reviewed and are negative.      Objective:   Physical Exam  Constitutional: He is oriented to person, place, and time. He appears well-developed and well-nourished.  HENT:  Head: Normocephalic and atraumatic.  Right Ear: External ear normal.  Left Ear: External ear normal.  Mouth/Throat: Oropharynx is clear and moist.  Poor dentition  Eyes: Conjunctivae and EOM are normal. Pupils are  equal, round, and reactive to light.  Neck: Normal range of motion.  Pulmonary/Chest: Effort normal.  Musculoskeletal: Normal range of motion. He exhibits no edema or tenderness.  Neurological: He is alert and oriented to person, place, and time.  Psychiatric: He has a normal mood and affect. His behavior is normal. His speech is delayed. He exhibits abnormal recent memory.  Nursing note and vitals reviewed.   Motor strength- 5/5 in the right deltoid, bicep, tricep of the grip, 4+ in the left deltoid, biceps, triceps, grip. 5/5 bilateral hip flexor knee extensor ankle dorsiflexor Normal sensation in the upper extremity except for the right ulnar nerve distribution Decreased sensation in the lower extremities in a stocking fashion.      Assessment & Plan:  1. History of right frontal infarct after anterior myocardial infarction, post emergent catheterization. 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 He does have some mild left-sided weakness. He is disabled from his previous job. He also would have difficulty in any type of sedentary job given his persistent cognitive deficits. In addition he likely has cardiology restrictions and will follow-up with his cardiologist on the specific nature of these.  Do not recommend driving No return to work  2. Right ulnar nerve as well as bilateral lower extremity paresthesias. Suspect a peripheral neuropathy, not a diabetic. We will further evaluate with EMG  Follow-up in one month for EMG/NCV

## 2015-12-11 ENCOUNTER — Inpatient Hospital Stay (HOSPITAL_COMMUNITY): Payer: BLUE CROSS/BLUE SHIELD

## 2015-12-11 ENCOUNTER — Emergency Department (HOSPITAL_COMMUNITY): Payer: BLUE CROSS/BLUE SHIELD

## 2015-12-11 ENCOUNTER — Inpatient Hospital Stay (HOSPITAL_COMMUNITY)
Admission: EM | Admit: 2015-12-11 | Discharge: 2015-12-15 | DRG: 291 | Disposition: A | Discharge status: Home / Self Care | Payer: BLUE CROSS/BLUE SHIELD | Attending: Internal Medicine | Admitting: Internal Medicine

## 2015-12-11 ENCOUNTER — Encounter (HOSPITAL_COMMUNITY): Payer: Self-pay | Admitting: Emergency Medicine

## 2015-12-11 DIAGNOSIS — G9340 Encephalopathy, unspecified: Secondary | ICD-10-CM | POA: Diagnosis not present

## 2015-12-11 DIAGNOSIS — I252 Old myocardial infarction: Secondary | ICD-10-CM | POA: Diagnosis not present

## 2015-12-11 DIAGNOSIS — Z7901 Long term (current) use of anticoagulants: Secondary | ICD-10-CM | POA: Diagnosis not present

## 2015-12-11 DIAGNOSIS — I248 Other forms of acute ischemic heart disease: Secondary | ICD-10-CM | POA: Diagnosis present

## 2015-12-11 DIAGNOSIS — Z7902 Long term (current) use of antithrombotics/antiplatelets: Secondary | ICD-10-CM

## 2015-12-11 DIAGNOSIS — E118 Type 2 diabetes mellitus with unspecified complications: Secondary | ICD-10-CM | POA: Diagnosis not present

## 2015-12-11 DIAGNOSIS — J9601 Acute respiratory failure with hypoxia: Secondary | ICD-10-CM | POA: Diagnosis not present

## 2015-12-11 DIAGNOSIS — Z794 Long term (current) use of insulin: Secondary | ICD-10-CM | POA: Diagnosis not present

## 2015-12-11 DIAGNOSIS — J189 Pneumonia, unspecified organism: Secondary | ICD-10-CM | POA: Diagnosis not present

## 2015-12-11 DIAGNOSIS — I5043 Acute on chronic combined systolic (congestive) and diastolic (congestive) heart failure: Secondary | ICD-10-CM | POA: Diagnosis present

## 2015-12-11 DIAGNOSIS — I10 Essential (primary) hypertension: Secondary | ICD-10-CM | POA: Diagnosis not present

## 2015-12-11 DIAGNOSIS — D638 Anemia in other chronic diseases classified elsewhere: Secondary | ICD-10-CM | POA: Diagnosis present

## 2015-12-11 DIAGNOSIS — Z7982 Long term (current) use of aspirin: Secondary | ICD-10-CM | POA: Diagnosis not present

## 2015-12-11 DIAGNOSIS — E785 Hyperlipidemia, unspecified: Secondary | ICD-10-CM | POA: Diagnosis present

## 2015-12-11 DIAGNOSIS — Z8673 Personal history of transient ischemic attack (TIA), and cerebral infarction without residual deficits: Secondary | ICD-10-CM | POA: Diagnosis not present

## 2015-12-11 DIAGNOSIS — N182 Chronic kidney disease, stage 2 (mild): Secondary | ICD-10-CM | POA: Diagnosis present

## 2015-12-11 DIAGNOSIS — J9690 Respiratory failure, unspecified, unspecified whether with hypoxia or hypercapnia: Secondary | ICD-10-CM | POA: Diagnosis present

## 2015-12-11 DIAGNOSIS — J9602 Acute respiratory failure with hypercapnia: Secondary | ICD-10-CM | POA: Diagnosis present

## 2015-12-11 DIAGNOSIS — A419 Sepsis, unspecified organism: Secondary | ICD-10-CM

## 2015-12-11 DIAGNOSIS — Z955 Presence of coronary angioplasty implant and graft: Secondary | ICD-10-CM | POA: Diagnosis not present

## 2015-12-11 DIAGNOSIS — K5900 Constipation, unspecified: Secondary | ICD-10-CM | POA: Diagnosis present

## 2015-12-11 DIAGNOSIS — E1122 Type 2 diabetes mellitus with diabetic chronic kidney disease: Secondary | ICD-10-CM | POA: Diagnosis present

## 2015-12-11 DIAGNOSIS — F17200 Nicotine dependence, unspecified, uncomplicated: Secondary | ICD-10-CM | POA: Diagnosis present

## 2015-12-11 DIAGNOSIS — J449 Chronic obstructive pulmonary disease, unspecified: Secondary | ICD-10-CM | POA: Diagnosis present

## 2015-12-11 DIAGNOSIS — G92 Toxic encephalopathy: Secondary | ICD-10-CM | POA: Diagnosis present

## 2015-12-11 DIAGNOSIS — I160 Hypertensive urgency: Secondary | ICD-10-CM | POA: Diagnosis present

## 2015-12-11 DIAGNOSIS — Z7984 Long term (current) use of oral hypoglycemic drugs: Secondary | ICD-10-CM | POA: Diagnosis not present

## 2015-12-11 DIAGNOSIS — E1140 Type 2 diabetes mellitus with diabetic neuropathy, unspecified: Secondary | ICD-10-CM | POA: Diagnosis present

## 2015-12-11 DIAGNOSIS — I130 Hypertensive heart and chronic kidney disease with heart failure and stage 1 through stage 4 chronic kidney disease, or unspecified chronic kidney disease: Secondary | ICD-10-CM | POA: Diagnosis present

## 2015-12-11 DIAGNOSIS — A4151 Sepsis due to Escherichia coli [E. coli]: Secondary | ICD-10-CM | POA: Diagnosis not present

## 2015-12-11 DIAGNOSIS — N390 Urinary tract infection, site not specified: Secondary | ICD-10-CM | POA: Diagnosis present

## 2015-12-11 DIAGNOSIS — I2510 Atherosclerotic heart disease of native coronary artery without angina pectoris: Secondary | ICD-10-CM | POA: Diagnosis present

## 2015-12-11 DIAGNOSIS — R0602 Shortness of breath: Secondary | ICD-10-CM | POA: Diagnosis present

## 2015-12-11 DIAGNOSIS — E876 Hypokalemia: Secondary | ICD-10-CM | POA: Diagnosis present

## 2015-12-11 DIAGNOSIS — R4182 Altered mental status, unspecified: Secondary | ICD-10-CM

## 2015-12-11 DIAGNOSIS — B9620 Unspecified Escherichia coli [E. coli] as the cause of diseases classified elsewhere: Secondary | ICD-10-CM | POA: Diagnosis present

## 2015-12-11 DIAGNOSIS — R7881 Bacteremia: Secondary | ICD-10-CM | POA: Diagnosis present

## 2015-12-11 LAB — TROPONIN I
TROPONIN I: 0.06 ng/mL — AB (ref <0.031)
Troponin I: 0.04 ng/mL — ABNORMAL HIGH (ref <0.031)
Troponin I: 0.07 ng/mL — ABNORMAL HIGH (ref <0.031)
Troponin I: 0.07 ng/mL — ABNORMAL HIGH (ref <0.031)

## 2015-12-11 LAB — DIFFERENTIAL
Basophils Absolute: 0 K/uL (ref 0.0–0.1)
Basophils Relative: 0 %
Eosinophils Absolute: 0.2 K/uL (ref 0.0–0.7)
Eosinophils Relative: 3 %
Lymphocytes Relative: 25 %
Lymphs Abs: 1.7 K/uL (ref 0.7–4.0)
Monocytes Absolute: 0.1 K/uL (ref 0.1–1.0)
Monocytes Relative: 1 %
Neutro Abs: 4.8 K/uL (ref 1.7–7.7)
Neutrophils Relative %: 71 %

## 2015-12-11 LAB — I-STAT ARTERIAL BLOOD GAS, ED
ACID-BASE DEFICIT: 2 mmol/L (ref 0.0–2.0)
BICARBONATE: 29.3 meq/L — AB (ref 20.0–24.0)
O2 SAT: 100 %
PRESSURE CONTROL: 1 cmH2O
TCO2: 32 mmol/L (ref 0–100)
pCO2 arterial: 88.3 mmHg (ref 35.0–45.0)
pH, Arterial: 7.137 — CL (ref 7.350–7.450)
pO2, Arterial: 374 mmHg — ABNORMAL HIGH (ref 80.0–100.0)

## 2015-12-11 LAB — BLOOD GAS, ARTERIAL
ACID-BASE DEFICIT: 0.9 mmol/L (ref 0.0–2.0)
BICARBONATE: 23 meq/L (ref 20.0–24.0)
DRAWN BY: 331761
FIO2: 0.6
LHR: 22 {breaths}/min
O2 SAT: 99.2 %
PEEP/CPAP: 5 cmH2O
PH ART: 7.405 (ref 7.350–7.450)
Patient temperature: 99.7
TCO2: 24.2 mmol/L (ref 0–100)
VT: 550 mL
pCO2 arterial: 37.8 mmHg (ref 35.0–45.0)
pO2, Arterial: 167 mmHg — ABNORMAL HIGH (ref 80.0–100.0)

## 2015-12-11 LAB — COMPREHENSIVE METABOLIC PANEL WITH GFR
ALT: 15 U/L — ABNORMAL LOW (ref 17–63)
AST: 22 U/L (ref 15–41)
Albumin: 2.8 g/dL — ABNORMAL LOW (ref 3.5–5.0)
Alkaline Phosphatase: 155 U/L — ABNORMAL HIGH (ref 38–126)
Anion gap: 12 (ref 5–15)
BUN: 11 mg/dL (ref 6–20)
CO2: 27 mmol/L (ref 22–32)
Calcium: 8.5 mg/dL — ABNORMAL LOW (ref 8.9–10.3)
Chloride: 99 mmol/L — ABNORMAL LOW (ref 101–111)
Creatinine, Ser: 1.49 mg/dL — ABNORMAL HIGH (ref 0.61–1.24)
GFR calc Af Amer: 55 mL/min — ABNORMAL LOW
GFR calc non Af Amer: 48 mL/min — ABNORMAL LOW
Glucose, Bld: 480 mg/dL — ABNORMAL HIGH (ref 65–99)
Potassium: 4.1 mmol/L (ref 3.5–5.1)
Sodium: 138 mmol/L (ref 135–145)
Total Bilirubin: 0.4 mg/dL (ref 0.3–1.2)
Total Protein: 7.6 g/dL (ref 6.5–8.1)

## 2015-12-11 LAB — URINE MICROSCOPIC-ADD ON

## 2015-12-11 LAB — I-STAT CHEM 8, ED
BUN: 13 mg/dL (ref 6–20)
CREATININE: 1.2 mg/dL (ref 0.61–1.24)
Calcium, Ion: 1.15 mmol/L (ref 1.13–1.30)
Chloride: 100 mmol/L — ABNORMAL LOW (ref 101–111)
GLUCOSE: 467 mg/dL — AB (ref 65–99)
HEMATOCRIT: 43 % (ref 39.0–52.0)
Hemoglobin: 14.6 g/dL (ref 13.0–17.0)
POTASSIUM: 4.1 mmol/L (ref 3.5–5.1)
Sodium: 139 mmol/L (ref 135–145)
TCO2: 28 mmol/L (ref 0–100)

## 2015-12-11 LAB — CBC
HCT: 36.8 % — ABNORMAL LOW (ref 39.0–52.0)
Hemoglobin: 10.9 g/dL — ABNORMAL LOW (ref 13.0–17.0)
MCH: 24.6 pg — ABNORMAL LOW (ref 26.0–34.0)
MCHC: 29.6 g/dL — ABNORMAL LOW (ref 30.0–36.0)
MCV: 83.1 fL (ref 78.0–100.0)
Platelets: 311 K/uL (ref 150–400)
RBC: 4.43 MIL/uL (ref 4.22–5.81)
RDW: 16.4 % — ABNORMAL HIGH (ref 11.5–15.5)
WBC: 6.7 K/uL (ref 4.0–10.5)

## 2015-12-11 LAB — APTT: APTT: 31 s (ref 24–37)

## 2015-12-11 LAB — LACTIC ACID, PLASMA
LACTIC ACID, VENOUS: 1.9 mmol/L (ref 0.5–2.0)
Lactic Acid, Venous: 1.2 mmol/L (ref 0.5–2.0)

## 2015-12-11 LAB — URINALYSIS, ROUTINE W REFLEX MICROSCOPIC
BILIRUBIN URINE: NEGATIVE
Glucose, UA: >1000 mg/dL — AB
Ketones, ur: NEGATIVE mg/dL
NITRITE: NEGATIVE
Protein, ur: 100 mg/dL — AB
SPECIFIC GRAVITY, URINE: 1.025 (ref 1.005–1.030)
pH: 5.5 (ref 5.0–8.0)

## 2015-12-11 LAB — CBG MONITORING, ED
GLUCOSE-CAPILLARY: 489 mg/dL — AB (ref 65–99)
Glucose-Capillary: 455 mg/dL — ABNORMAL HIGH (ref 65–99)

## 2015-12-11 LAB — HEPARIN LEVEL (UNFRACTIONATED)
HEPARIN UNFRACTIONATED: 0.52 [IU]/mL (ref 0.30–0.70)
Heparin Unfractionated: 0.34 IU/mL (ref 0.30–0.70)

## 2015-12-11 LAB — I-STAT CG4 LACTIC ACID, ED: LACTIC ACID, VENOUS: 3.45 mmol/L — AB (ref 0.5–2.0)

## 2015-12-11 LAB — GLUCOSE, CAPILLARY
GLUCOSE-CAPILLARY: 445 mg/dL — AB (ref 65–99)
GLUCOSE-CAPILLARY: 376 mg/dL — AB (ref 65–99)
GLUCOSE-CAPILLARY: 316 mg/dL — AB (ref 65–99)
GLUCOSE-CAPILLARY: 221 mg/dL — AB (ref 65–99)

## 2015-12-11 LAB — TRIGLYCERIDES: Triglycerides: 106 mg/dL (ref <150)

## 2015-12-11 LAB — RAPID URINE DRUG SCREEN, HOSP PERFORMED
Amphetamines: NOT DETECTED
Barbiturates: NOT DETECTED
Benzodiazepines: NOT DETECTED
Cocaine: NOT DETECTED
Opiates: NOT DETECTED
Tetrahydrocannabinol: POSITIVE — AB

## 2015-12-11 LAB — PROCALCITONIN: Procalcitonin: 3.91 ng/mL

## 2015-12-11 LAB — PROTIME-INR
INR: 1.68 — ABNORMAL HIGH (ref 0.00–1.49)
Prothrombin Time: 19.8 s — ABNORMAL HIGH (ref 11.6–15.2)

## 2015-12-11 LAB — I-STAT TROPONIN, ED: TROPONIN I, POC: 0.04 ng/mL (ref 0.00–0.08)

## 2015-12-11 LAB — BRAIN NATRIURETIC PEPTIDE: B Natriuretic Peptide: 492.7 pg/mL — ABNORMAL HIGH (ref 0.0–100.0)

## 2015-12-11 LAB — MRSA PCR SCREENING: MRSA BY PCR: NEGATIVE

## 2015-12-11 LAB — ETHANOL: Alcohol, Ethyl (B): <5 mg/dL

## 2015-12-11 MED ORDER — HEPARIN SODIUM (PORCINE) 5000 UNIT/ML IJ SOLN
5000.0000 [IU] | Freq: Three times a day (TID) | INTRAMUSCULAR | Status: DC
  Filled 2015-12-11 (×2): qty 1

## 2015-12-11 MED ORDER — ASPIRIN 81 MG PO CHEW: 324.0000 mg | CHEWABLE_TABLET | ORAL | Status: AC

## 2015-12-11 MED ORDER — PROPOFOL 1000 MG/100ML IV EMUL
0.0000 ug/kg/min | INTRAVENOUS | Status: DC
  Administered 2015-12-12: 10 ug/kg/min via INTRAVENOUS
  Filled 2015-12-11: qty 100

## 2015-12-11 MED ORDER — HEPARIN (PORCINE) IN NACL 100-0.45 UNIT/ML-% IJ SOLN
1200.0000 [IU]/h | INTRAMUSCULAR | Status: DC
  Administered 2015-12-11: 1200 [IU]/h via INTRAVENOUS
  Administered 2015-12-12 – 2015-12-13 (×2): 1100 [IU]/h via INTRAVENOUS
  Administered 2015-12-14 – 2015-12-15 (×2): 1200 [IU]/h via INTRAVENOUS
  Filled 2015-12-11 (×9): qty 250

## 2015-12-11 MED ORDER — ACETAMINOPHEN 650 MG RE SUPP
650.0000 mg | Freq: Once | RECTAL | Status: AC
  Administered 2015-12-11: 650 mg via RECTAL
  Filled 2015-12-11: qty 1

## 2015-12-11 MED ORDER — PANTOPRAZOLE SODIUM 40 MG IV SOLR
40.0000 mg | Freq: Every day | INTRAVENOUS | Status: DC
  Administered 2015-12-11 – 2015-12-12 (×2): 40 mg via INTRAVENOUS
  Filled 2015-12-11 (×2): qty 40

## 2015-12-11 MED ORDER — VANCOMYCIN HCL IN DEXTROSE 1-5 GM/200ML-% IV SOLN
1000.0000 mg | Freq: Once | INTRAVENOUS | Status: AC
  Administered 2015-12-11: 1000 mg via INTRAVENOUS
  Filled 2015-12-11: qty 200

## 2015-12-11 MED ORDER — PROPOFOL 1000 MG/100ML IV EMUL
5.0000 ug/kg/min | Freq: Once | INTRAVENOUS | Status: AC
  Administered 2015-12-11: 15 ug/kg/min via INTRAVENOUS

## 2015-12-11 MED ORDER — FENTANYL CITRATE (PF) 100 MCG/2ML IJ SOLN
100.0000 ug | INTRAMUSCULAR | Status: DC | PRN
  Administered 2015-12-11 (×2): 100 ug via INTRAVENOUS
  Filled 2015-12-11 (×2): qty 2

## 2015-12-11 MED ORDER — ASPIRIN 300 MG RE SUPP
300.0000 mg | RECTAL | Status: AC
  Administered 2015-12-11: 300 mg via RECTAL
  Filled 2015-12-11: qty 1

## 2015-12-11 MED ORDER — CHLORHEXIDINE GLUCONATE 0.12% ORAL RINSE (MEDLINE KIT)
15.0000 mL | Freq: Two times a day (BID) | OROMUCOSAL | Status: DC
  Administered 2015-12-11 – 2015-12-15 (×6): 15 mL via OROMUCOSAL

## 2015-12-11 MED ORDER — SODIUM CHLORIDE 0.9 % IV BOLUS (SEPSIS)
1000.0000 mL | Freq: Once | INTRAVENOUS | Status: AC
  Administered 2015-12-11: 1000 mL via INTRAVENOUS

## 2015-12-11 MED ORDER — VANCOMYCIN HCL IN DEXTROSE 1-5 GM/200ML-% IV SOLN
1000.0000 mg | Freq: Two times a day (BID) | INTRAVENOUS | Status: DC
  Administered 2015-12-11 – 2015-12-12 (×2): 1000 mg via INTRAVENOUS
  Filled 2015-12-11 (×3): qty 200

## 2015-12-11 MED ORDER — ETOMIDATE 2 MG/ML IV SOLN
20.0000 mg | Freq: Once | INTRAVENOUS | Status: AC
  Administered 2015-12-11: 20 mg via INTRAVENOUS

## 2015-12-11 MED ORDER — SODIUM CHLORIDE 0.9 % IV SOLN
INTRAVENOUS | Status: DC
  Administered 2015-12-11: 07:00:00 via INTRAVENOUS

## 2015-12-11 MED ORDER — ROCURONIUM BROMIDE 50 MG/5ML IV SOLN
80.0000 mg | Freq: Once | INTRAVENOUS | Status: AC
  Administered 2015-12-11: 80 mg via INTRAVENOUS
  Filled 2015-12-11: qty 8

## 2015-12-11 MED ORDER — DEXTROSE 5 % IV SOLN
1.0000 g | Freq: Two times a day (BID) | INTRAVENOUS | Status: DC
  Administered 2015-12-11 – 2015-12-12 (×2): 1 g via INTRAVENOUS
  Filled 2015-12-11 (×3): qty 1

## 2015-12-11 MED ORDER — ANTISEPTIC ORAL RINSE SOLUTION (CORINZ)
7.0000 mL | Freq: Four times a day (QID) | OROMUCOSAL | Status: DC
  Administered 2015-12-12 (×3): 7 mL via OROMUCOSAL

## 2015-12-11 MED ORDER — PROPOFOL 1000 MG/100ML IV EMUL
INTRAVENOUS | Status: AC
  Filled 2015-12-11: qty 100

## 2015-12-11 MED ORDER — INSULIN ASPART 100 UNIT/ML ~~LOC~~ SOLN
0.0000 [IU] | SUBCUTANEOUS | Status: DC
  Administered 2015-12-11: 11 [IU] via SUBCUTANEOUS
  Administered 2015-12-11: 5 [IU] via SUBCUTANEOUS
  Administered 2015-12-11: 15 [IU] via SUBCUTANEOUS
  Administered 2015-12-12: 2 [IU] via SUBCUTANEOUS

## 2015-12-11 MED ORDER — FENTANYL CITRATE (PF) 100 MCG/2ML IJ SOLN: 100.0000 ug | INTRAMUSCULAR | Status: DC | PRN

## 2015-12-11 MED ORDER — SODIUM CHLORIDE 0.9 % IV BOLUS (SEPSIS)
250.0000 mL | Freq: Once | INTRAVENOUS | Status: AC
  Administered 2015-12-11: 250 mL via INTRAVENOUS

## 2015-12-11 MED ORDER — IPRATROPIUM-ALBUTEROL 0.5-2.5 (3) MG/3ML IN SOLN
3.0000 mL | Freq: Four times a day (QID) | RESPIRATORY_TRACT | Status: DC
  Administered 2015-12-11 – 2015-12-12 (×5): 3 mL via RESPIRATORY_TRACT
  Filled 2015-12-11 (×5): qty 3

## 2015-12-11 MED ORDER — BUDESONIDE 0.5 MG/2ML IN SUSP
0.5000 mg | Freq: Two times a day (BID) | RESPIRATORY_TRACT | Status: DC
  Administered 2015-12-11 – 2015-12-12 (×2): 0.5 mg via RESPIRATORY_TRACT
  Filled 2015-12-11 (×3): qty 2

## 2015-12-11 MED ORDER — CEFEPIME HCL 2 G IJ SOLR
2.0000 g | Freq: Once | INTRAMUSCULAR | Status: AC
  Administered 2015-12-11: 2 g via INTRAVENOUS
  Filled 2015-12-11: qty 2

## 2015-12-11 MED ORDER — ALBUTEROL SULFATE (2.5 MG/3ML) 0.083% IN NEBU: 2.5000 mg | INHALATION_SOLUTION | RESPIRATORY_TRACT | Status: DC | PRN

## 2015-12-11 NOTE — Progress Notes (Addendum)
ANTICOAGULATION CONSULT NOTE - Initial Consult  Pharmacy Consult for Heparin Indication: hx CVA  No Known Allergies  Patient Measurements: Height: 5\' 8"  (172.7 cm) Weight: 160 lb (72.576 kg) IBW/kg (Calculated) : 68.4  Vital Signs: Temp: 99.7 F (37.6 C) (05/20 0700) Temp Source: Rectal (05/20 0454) BP: 113/82 mmHg (05/20 0826) Pulse Rate: 81 (05/20 0826)  Labs:  Recent Labs  12/08/15 0924 12/08/15 0947 12/11/15 0500 12/11/15 0506 12/11/15 0720  HGB  --   --  10.9* 14.6  --   HCT  --   --  36.8* 43.0  --   PLT  --   --  311  --   --   APTT  --   --  31  --   --   LABPROT  --   --  19.8*  --   --   INR 1.8  --  1.68*  --   --   CREATININE  --  1.38* 1.49* 1.20  --   TROPONINI  --   --  0.04*  --  0.06*    Estimated Creatinine Clearance: 60.2 mL/min (by C-G formula based on Cr of 1.2).   Medical History: Past Medical History  Diagnosis Date  . PUD (peptic ulcer disease)   . CAD (coronary artery disease)     a. STEMI 09/2015 w/ DES to Prox LAD  . CVA (cerebral infarction)     a. 09/2015: acute small right frontal lobe infarct   Assessment: 64yom on coumadin pta for hx recent CVA (09/2015), admitted with altered mental status and subsequently intubated. CT head negative for bleed. Pharmacy asked to hold coumadin and begin IV heparin. INR 1.68 - last anticoagulation clinic visit on 5/17 his INR was 1.8 on 5mg  daily except 7.5mg  on Monday and Friday. CBC stable.   Goal of Therapy:  Heparin level 0.3-0.5 units/ml Monitor platelets by anticoagulation protocol: Yes   Plan:  1) Begin heparin at 1200 units/hr with no bolus (elevated INR and recent CVA) 2) Check 6 hour heparin level 3) Daily heparin level and CBC  Fredrik Rigger 12/11/2015,9:13 AM   Addendum: Initial heparin level is therapeutic at 0.34. Continue heparin at 1200 units/hr and check another level in 6 hours to confirm.  Fredrik Rigger 12/11/2015, 5:14 PM

## 2015-12-11 NOTE — Progress Notes (Signed)
Initial Nutrition Assessment  DOCUMENTATION CODES:  Not applicable  INTERVENTION:  If unable to extubate <24 hours and anticipated needs >5 days, recommend TF.  TF via OG/NGT with Vital AF 1.2 at 25 ml/h and Prostat 30 ml BID on day 1; on day 2, D/c Prostat and increase TF to goal rate of 65 ml/h (1560 ml per day) to provide 1872 kcals (+114 from sedation), 117 gm protein, 1265 ml free water daily.  NUTRITION DIAGNOSIS:  Inadequate oral intake related to inability to eat as evidenced by NPO status.  GOAL:   Patient will meet greater than or equal to 90% of their needs  MONITOR:  Diet advancement, Vent status, Labs, TF tolerance, I & O's  REASON FOR ASSESSMENT:  Ventilator    ASSESSMENT:  65 y/o male PMHx PUD, CAD, CVA, CHF, ARDS, Stemi. Presented w/ AoC SOB, AMS, fever, HTN and intubated when arrived. AMS thought to be related to Sepsis/hypercapnea vs new CVA.Possible HCAP.   Pt intubated, though was able to answer brief questions. States UBW is 150 and says he had not been suffering from poor appetite/wt loss. Everything was normal 2 days ago.   Patient is currently intubated on ventilator support MV: 12.1L/min Temp (24hrs), Avg:98.8 F (37.1 C), Min:98.2 F (36.8 C), Max:101.1 F (38.4 C)  Propofol: 4.49m/hr = 114 kcal/day  Labs reviewed: Severely Hyperglycemic, BNP: 493, albumin: 2.8, alk/phos: 155  Nfpe: wdl. No edema noted.    Recent Labs Lab 12/08/15 0947 12/11/15 0500 12/11/15 0506  NA 136 138 139  K 4.9 4.1 4.1  CL 99 99* 100*  CO2 30 27  --   BUN '15 11 13  '$ CREATININE 1.38* 1.49* 1.20  CALCIUM 8.8 8.5*  --   GLUCOSE 324* 480* 467*   Diet Order:  Diet NPO time specified  Skin:  Reviewed, no issues  Last BM:  Unknown  Height:  Ht Readings from Last 1 Encounters:  12/11/15 '5\' 8"'$  (1.727 m)   Weight:  Wt Readings from Last 1 Encounters:  12/11/15 160 lb (72.576 kg)   Wt Readings from Last 10 Encounters:  12/11/15 160 lb (72.576 kg)  10/28/15  154 lb (69.854 kg)  10/26/15 143 lb 4.8 oz (65 kg)  10/15/15 127 lb (57.607 kg)   Ideal Body Weight:  70 kg  BMI:  Body mass index is 24.33 kg/(m^2).  Estimated Nutritional Needs:  Kcal:  2011 Protein:  102-116 g Pro (1.4-1.6 g/kg bw) Fluid:  >1.8 liters  EDUCATION NEEDS:  No education needs identified at this time  NBurtis JunesRD, LDN Clinical Nutrition Pager: [phone]/20/2017 3:12 PM

## 2015-12-11 NOTE — ED Provider Notes (Signed)
CSN: [id number]     Arrival date & time 12/11/15  0447 History   First MD Initiated Contact with Patient 12/11/15 [phone]     Chief Complaint  Patient presents with  . Respiratory Distress    LEVEL  5 CAVEAT DUE TO ACUITY OF CONDITION  Patient is a 65 y.o. male presenting with shortness of breath. The history is provided by a relative and the EMS personnel.  Shortness of Breath Severity:  Severe Onset quality:  Gradual Timing:  Constant Progression:  Worsening Chronicity:  New Relieved by:  Nothing Worsened by:  Nothing tried Associated symptoms: fever   PT WITH H/O CAD, HO CVA WIFE REPORTS THAT OVER PAST DAY HE HAS HAD COUGH, HE REPORTED FEELING COLD TO HIS WIFE AND WAS SHORT OF BREATH TONIGHT HE WOKE UP AND REPORTED HE WAS FEELING WORSE WITH HIS SHORT OF BREATH NO OTHER DETAILS ARE KNOWN AT THIS TIME  Past Medical History  Diagnosis Date  . PUD (peptic ulcer disease)   . CAD (coronary artery disease)     a. STEMI 09/2015 w/ DES to Prox LAD  . CVA (cerebral infarction)     a. 09/2015: acute small right frontal lobe infarct   Past Surgical History  Procedure Laterality Date  . Repair of peptic ulcer    . Cardiac catheterization N/A 09/28/2015    Procedure: Left Heart Cath and Coronary Angiography;  Surgeon: Runell Gess, MD;  Location: [facility] INVASIVE CV LAB;  Service: Cardiovascular;  Laterality: N/A;  . Cardiac catheterization N/A 10/08/2015    Procedure: Left Heart Cath and Coronary Angiography;  Surgeon: Lyn Records, MD;  Location: [facility] INVASIVE CV LAB;  Service: Cardiovascular;  Laterality: N/A;   No family history on file. Social History  Substance Use Topics  . Smoking status: Current Every Day Smoker -- 0.25 packs/day  . Smokeless tobacco: None  . Alcohol Use: No    Review of Systems  Unable to perform ROS: Acuity of condition  Constitutional: Positive for fever.  Respiratory: Positive for shortness of breath.       Allergies  Review of patient's allergies  indicates no known allergies.  Home Medications   Prior to Admission medications   Medication Sig Start Date End Date Taking? Authorizing Provider  aspirin 81 MG chewable tablet Chew 1 tablet (81 mg total) by mouth daily. 10/26/15   Jacquelynn Cree, PA-C  atorvastatin (LIPITOR) 80 MG tablet Take 1 tablet (80 mg total) by mouth daily at 6 PM. 11/24/15   Rollene Rotunda, MD  blood glucose meter kit and supplies KIT Dispense based on patient and insurance preference. Use up to four times daily as directed. (FOR ICD-9 250.00, 250.01). 10/26/15   Evlyn Kanner Love, PA-C  carvedilol (COREG) 6.25 MG tablet Take 1 tablet (6.25 mg total) by mouth 2 (two) times daily with a meal. 11/24/15   Rollene Rotunda, MD  clopidogrel (PLAVIX) 75 MG tablet Take 4 tablets today then take 1 tablet by mouth daily after that 10/28/15   Joline Salt [name], PA-C  famotidine (PEPCID AC) 10 MG chewable tablet Chew 10 mg by mouth 2 (two) times daily.    Historical Provider, MD  Insulin Glargine (LANTUS) 100 UNIT/ML Solostar Pen Inject 5 Units into the skin daily after supper. Patient not taking: Reported on 12/09/2015 10/26/15   Evlyn Kanner Love, PA-C  Insulin Pen Needle (EASY TOUCH PEN NEEDLES) 31G X 5 MM MISC 1 application by Does not apply route daily at 10 pm. Patient  not taking: Reported on 12/09/2015 10/26/15   Ivan Anchors Love, PA-C  lisinopril (PRINIVIL,ZESTRIL) 20 MG tablet Take 1 tablet (20 mg total) by mouth daily. 12/08/15   Minus Breeding, MD  Maltodextrin-Xanthan Gum (RESOURCE THICKENUP CLEAR) POWD Take 1 g by mouth as needed. Thicken liquids to pudding consistency 10/26/15   Ivan Anchors Love, PA-C  Menthol-Methyl Salicylate (MUSCLE RUB) 10-15 % CREA Apply 1 application topically as needed for muscle pain. 10/26/15   Bary Leriche, PA-C  metFORMIN (GLUCOPHAGE) 850 MG tablet Take 1 tablet (850 mg total) by mouth daily with breakfast. 10/26/15   Ivan Anchors Love, PA-C  nitroGLYCERIN (NITROSTAT) 0.4 MG SL tablet Use one pill every 5 minutes X 3 if needed for  chest pain. Contact MD if pain does not improve 10/26/15   Ivan Anchors Love, PA-C  senna (SENOKOT) 8.6 MG TABS tablet Take 2 tablets (17.2 mg total) by mouth at bedtime. 10/26/15   Bary Leriche, PA-C  spironolactone (ALDACTONE) 25 MG tablet Take 0.5 tablets (12.5 mg total) by mouth daily. 10/26/15   Bary Leriche, PA-C  warfarin (COUMADIN) 5 MG tablet Take 1 tablet daily or as directed by coumadin clinic 10/28/15   Evelene Croon [name], PA-C   BP 191/109 mmHg  Pulse 113  Temp(Src) 99 F (37.2 C) (Rectal)  Resp 9  Ht $R'5\' 8"'tQ$  (1.727 m)  Wt 72.576 kg  BMI 24.33 kg/m2  SpO2 100% Physical Exam CONSTITUTIONAL: ill appearing HEAD: Normocephalic/atraumatic EYES: left pupil is cloudy.  Right pupil reactive ENMT: Mucous membranes moist NECK: supple no meningeal signs SPINE/BACK:entire spine nontender CV: tachycardic LUNGS: crackles bilaterally, tachypneic ABDOMEN: soft, nontender NEURO: Pt is somnolent, he does not speak.  He does not follow commands.  He is altered.  GCS 6 (eyes open) EXTREMITIES: pulses normal/equal, LE edema noted SKIN: warm, color normal PSYCH: unable to assess  ED Course  Procedures  CRITICAL CARE Performed by: Sharyon Cable Total critical care time: 40 minutes Critical care time was exclusive of separately billable procedures and treating other patients. Critical care was necessary to treat or prevent imminent or life-threatening deterioration. Critical care was time spent personally by me on the following activities: development of treatment plan with patient and/or surrogate as well as nursing, discussions with consultants, evaluation of patient's response to treatment, examination of patient, obtaining history from patient or surrogate, ordering and performing treatments and interventions, ordering and review of laboratory studies, ordering and review of radiographic studies, pulse oximetry and re-evaluation of patient's condition.  INTUBATION Performed by: Sharyon Cable  Required items: required devices, and special equipment available Patient identity confirmed: provided demographic data and hospital-assigned identification number Time out: Immediately prior to procedure a "time out" was called to verify the correct patient, procedure, equipment, support staff and site/side marked as required.  Indications: altered mental status, respiratory failure  Intubation method: Glidescope Laryngoscopy , direct laryngoscopy assisted by bougie Unable to be intubated with initial glide laryngoscopy Transitioned to Direct laryngoscopy with bougie and able to intubate Pt required significant bagging in between attempts and he had some hypoxia that resolved after placement of oral/nasal airway Patient was successfully intubated   Preoxygenation: BVM  Sedatives: Etomidate Paralytic: rocuronium  Tube Size:  Cuffed 7.5  Post-procedure assessment: chest rise and ETCO2 monitor Breath sounds: equal and absent over the epigastrium Tube secured with: ETT holder Chest x-ray interpreted by radiologist and me.  Chest x-ray findings: endotracheal tube in appropriate position  Patient tolerated the procedure well with no  immediate complications.     Sepsis - Repeat Assessment  Performed at:    0615am  Vitals     Blood pressure 191/109, pulse 113, temperature 99 F (37.2 C), temperature source Rectal, resp. rate 9, height 5\' 8"  (1.727 m), weight 72.576 kg, SpO2 100 %.  Heart:     Tachycardic  Lungs:    Rales  Capillary Refill:   <2 sec  Peripheral Pulse:   Radial pulse palpable  Skin:     Normal Color   PATIENT WAS SEEN ON ARRIVAL HE WAS NOTED TO BE CONFUSED, FEBRILE, RESPIRATORY FAILURE CODE SEPSIS CALLED WITH IV FLUIDS/ANTIBIOTICS I INTUBATED PATIENT (SEE NOTE ABOVE) I SPOKE TO CRITICAL CARE WILL ADMIT TO ICU PATIENT DID HAVE ABNORMAL EKG WITH ELEVATION, BUT THIS IS NOT CLINICAL SCENARIO SUGGESTIVE OF STEMI (PT IS SEPTIC FROM PNEUMONIA)  Labs  Review Labs Reviewed  PROTIME-INR - Abnormal; Notable for the following:    Prothrombin Time 19.8 (*)    INR 1.68 (*)    All other components within normal limits  CBC - Abnormal; Notable for the following:    Hemoglobin 10.9 (*)    HCT 36.8 (*)    MCH 24.6 (*)    MCHC 29.6 (*)    RDW 16.4 (*)    All other components within normal limits  COMPREHENSIVE METABOLIC PANEL - Abnormal; Notable for the following:    Chloride 99 (*)    Glucose, Bld 480 (*)    Creatinine, Ser 1.49 (*)    Calcium 8.5 (*)    Albumin 2.8 (*)    ALT 15 (*)    Alkaline Phosphatase 155 (*)    GFR calc non Af Amer 48 (*)    GFR calc Af Amer 55 (*)    All other components within normal limits  URINE RAPID DRUG SCREEN, HOSP PERFORMED - Abnormal; Notable for the following:    Tetrahydrocannabinol POSITIVE (*)    All other components within normal limits  TROPONIN I - Abnormal; Notable for the following:    Troponin I 0.04 (*)    All other components within normal limits  I-STAT CHEM 8, ED - Abnormal; Notable for the following:    Chloride 100 (*)    Glucose, Bld 467 (*)    All other components within normal limits  CBG MONITORING, ED - Abnormal; Notable for the following:    Glucose-Capillary 455 (*)    All other components within normal limits  I-STAT CG4 LACTIC ACID, ED - Abnormal; Notable for the following:    Lactic Acid, Venous 3.45 (*)    All other components within normal limits  I-STAT ARTERIAL BLOOD GAS, ED - Abnormal; Notable for the following:    pH, Arterial 7.137 (*)    pCO2 arterial 88.3 (*)    pO2, Arterial 374.0 (*)    Bicarbonate 29.3 (*)    All other components within normal limits  CULTURE, BLOOD (ROUTINE X 2)  CULTURE, BLOOD (ROUTINE X 2)  URINE CULTURE  ETHANOL  APTT  DIFFERENTIAL  BLOOD GAS, ARTERIAL  URINALYSIS, ROUTINE W REFLEX MICROSCOPIC (NOT AT [facility])  BLOOD GAS, ARTERIAL  TROPONIN I  TROPONIN I  TROPONIN I  LACTIC ACID, PLASMA  LACTIC ACID, PLASMA  BRAIN NATRIURETIC  PEPTIDE  PROCALCITONIN  BLOOD GAS, ARTERIAL  TRIGLYCERIDES  HEMOGLOBIN A1C  I-STAT TROPOININ, ED    Imaging Review Dg Chest Portable 1 View  12/11/2015  CLINICAL DATA:  Endotracheal tube placement EXAM: PORTABLE CHEST 1 VIEW COMPARISON:  None.  FINDINGS: Endotracheal tube present with tip measuring 3.7 cm above the carina. Enteric tube is present with tip off the field of view but below the left hemidiaphragm. Proximal side hole is positioned just below the expected location of the EG junction. Heart size is normal. There are bilateral perihilar infiltrates suggesting edema or pneumonia. No blunting of costophrenic angles. No pneumothorax. IMPRESSION: Appliances appear in satisfactory location. Bilateral perihilar infiltrates suggesting edema or pneumonia. Electronically Signed   By: Lucienne Capers M.D.   On: 12/11/2015 06:16   I have personally reviewed and evaluated these images and lab results as part of my medical decision-making.   EKG Interpretation   Date/Time:  Saturday Dec 11 2015 05:00:20 EDT Ventricular Rate:  99 PR Interval:  171 QRS Duration: 96 QT Interval:  329 QTC Calculation: 422 R Axis:   155 Text Interpretation:  Sinus rhythm RVH with secondary repolarization abnrm  Minimal ST elevation, inferior leads ST depression V1-V3, suggest  recording posterior leads Baseline wander in lead(s) V4 V5 Abnormal ekg  Confirmed by Christy Gentles  MD, Kailah Pennel ([id number]) on 12/11/2015 5:04:35 AM     Medications  sodium chloride 0.9 % bolus 1,000 mL (1,000 mLs Intravenous New Bag/Given 12/11/15 0514)    And  sodium chloride 0.9 % bolus 1,000 mL (1,000 mLs Intravenous New Bag/Given 12/11/15 0549)    And  sodium chloride 0.9 % bolus 250 mL (250 mLs Intravenous New Bag/Given 12/11/15 0635)  vancomycin (VANCOCIN) IVPB 1000 mg/200 mL premix (1,000 mg Intravenous New Bag/Given 12/11/15 0624)  propofol (DIPRIVAN) 1000 MG/100ML infusion (1,000 mg  Not Given 12/11/15 0542)  heparin injection 5,000  Units (0 Units Subcutaneous Hold 12/11/15 0632)  0.9 %  sodium chloride infusion ( Intravenous New Bag/Given 12/11/15 0635)  pantoprazole (PROTONIX) injection 40 mg (not administered)  albuterol (PROVENTIL) (2.5 MG/3ML) 0.083% nebulizer solution 2.5 mg (not administered)  propofol (DIPRIVAN) 1000 MG/100ML infusion (not administered)  fentaNYL (SUBLIMAZE) injection 100 mcg (not administered)  fentaNYL (SUBLIMAZE) injection 100 mcg (not administered)  insulin aspart (novoLOG) injection 0-15 Units (not administered)  ceFEPIme (MAXIPIME) 1 g in dextrose 5 % 50 mL IVPB (not administered)  vancomycin (VANCOCIN) IVPB 1000 mg/200 mL premix (not administered)  ceFEPIme (MAXIPIME) 2 g in dextrose 5 % 50 mL IVPB (0 g Intravenous Stopped 12/11/15 0628)  acetaminophen (TYLENOL) suppository 650 mg (650 mg Rectal Given 12/11/15 0544)  etomidate (AMIDATE) injection 20 mg (20 mg Intravenous Given 12/11/15 0515)  rocuronium (ZEMURON) injection 80 mg (80 mg Intravenous Given 12/11/15 0516)  propofol (DIPRIVAN) 1000 MG/100ML infusion (20 mcg/kg/min  72.6 kg Intravenous Rate/Dose Change 12/11/15 0601)  aspirin chewable tablet 324 mg ( Oral See Alternative 12/11/15 1552)    Or  aspirin suppository 300 mg (300 mg Rectal Given 12/11/15 0802)    MDM   Final diagnoses:  HCAP (healthcare-associated pneumonia)  Acute respiratory failure with hypercapnia ([place])  Sepsis, due to unspecified organism [facility])    Nursing notes including past medical history and social history reviewed and considered in documentation xrays/imaging reviewed by myself and considered during evaluation Labs/vital reviewed myself and considered during evaluation     Ripley Fraise, MD 12/11/15 [phone]

## 2015-12-11 NOTE — Progress Notes (Addendum)
ANTICOAGULATION CONSULT NOTE - Follow Up Consult  Pharmacy Consult for heparin Indication: stroke  Labs:  Recent Labs  12/11/15 0500 12/11/15 0506 12/11/15 0720 12/11/15 1114 12/11/15 1611 12/11/15 1839 12/11/15 2200  HGB 10.9* 14.6  --   --   --   --   --   HCT 36.8* 43.0  --   --   --   --   --   PLT 311  --   --   --   --   --   --   APTT 31  --   --   --   --   --   --   LABPROT 19.8*  --   --   --   --   --   --   INR 1.68*  --   --   --   --   --   --   HEPARINUNFRC  --   --   --   --  0.34  --  0.52  CREATININE 1.49* 1.20  --   --   --   --   --   TROPONINI 0.04*  --  0.06* 0.07*  --  0.07*  --      Assessment: 65yo male now slightly above goal after one level at lower end of goal, apparently accumulating.  Goal of Therapy:  Heparin level 0.3-0.5 units/ml   Plan:  Will decrease heparin gtt slightly to 1100 units/hr and check level with am labs.  Vernard Gambles, PharmD, BCPS  12/11/2015,10:45 PM    ADDENDUM: Now therapeutic on heparin with level 0.48. Will continue gtt at current rate and confirm stable with additional level.  VB 12/12/2015 6:42 AM

## 2015-12-11 NOTE — H&P (Signed)
PULMONARY / CRITICAL CARE MEDICINE   Name: Eddie [name] MRN: [id number] DOB: Jan 10, 1951    ADMISSION DATE:  12/11/2015 CONSULTATION DATE:  MD  REFERRING MD:    CHIEF COMPLAINT:  SOB  HISTORY OF PRESENT ILLNESS:   Pt was admitted for acute SOB and AMS. Pt is encephalopathic so unable to obtain history. Chart review and speaking with wife was done.  Pt was admitted from 09/28/15 until 10/15/15. Came in as a cardiac arrest. Had STEMI and DES. Was placed on THM protocol. He was slow to recover. Had ARDS/MSSA PNA, Also had a R frontal lobe infarct. Eventually extubated. Pt transferred to [facility] for a couple of weeks and eventually got discharged to home.  Pt has been home for 4-6 weeks now. He was in his USOH until 1-2 days prior to admission. Wife noticed frequent urination so they saw PCP.  PCP though UTI but Abx were not filled as it interacted with his other meds.   Pt woke up at 11 am, complaining of acute on chronic SOB. 911 was called and was brought to ED. Mental status deterirated on arrival to ED. He was also HTN 829 systolic with a fever of 562. He was intubated. PCCM consulted to admit pt.     PAST MEDICAL HISTORY :  He  has a past medical history of PUD (peptic ulcer disease); CAD (coronary artery disease); and CVA (cerebral infarction).  Recent complicated admission in 09/2015 - Cardiac arrest, STEMI with DES, CHF with EF 30-35%, ARDS, MSSA PNA, R frontal CVA  PAST SURGICAL HISTORY: He  has past surgical history that includes Repair of Peptic Ulcer; Cardiac catheterization (N/A, 09/28/2015); and Cardiac catheterization (N/A, 10/08/2015).  No Known Allergies  No current facility-administered medications on file prior to encounter.   Current Outpatient Prescriptions on File Prior to Encounter  Medication Sig  . aspirin 81 MG chewable tablet Chew 1 tablet (81 mg total) by mouth daily.  Marland Kitchen atorvastatin (LIPITOR) 80 MG tablet Take 1 tablet (80 mg total) by mouth daily at 6 PM.  .  blood glucose meter kit and supplies KIT Dispense based on patient and insurance preference. Use up to four times daily as directed. (FOR ICD-9 250.00, 250.01).  . carvedilol (COREG) 6.25 MG tablet Take 1 tablet (6.25 mg total) by mouth 2 (two) times daily with a meal.  . clopidogrel (PLAVIX) 75 MG tablet Take 4 tablets today then take 1 tablet by mouth daily after that  . famotidine (PEPCID AC) 10 MG chewable tablet Chew 10 mg by mouth 2 (two) times daily.  . Insulin Glargine (LANTUS) 100 UNIT/ML Solostar Pen Inject 5 Units into the skin daily after supper. (Patient not taking: Reported on 12/09/2015)  . Insulin Pen Needle (EASY TOUCH PEN NEEDLES) 31G X 5 MM MISC 1 application by Does not apply route daily at 10 pm. (Patient not taking: Reported on 12/09/2015)  . lisinopril (PRINIVIL,ZESTRIL) 20 MG tablet Take 1 tablet (20 mg total) by mouth daily.  . Maltodextrin-Xanthan Gum (RESOURCE THICKENUP CLEAR) POWD Take 1 g by mouth as needed. Thicken liquids to pudding consistency  . Menthol-Methyl Salicylate (MUSCLE RUB) 10-15 % CREA Apply 1 application topically as needed for muscle pain.  . metFORMIN (GLUCOPHAGE) 850 MG tablet Take 1 tablet (850 mg total) by mouth daily with breakfast.  . nitroGLYCERIN (NITROSTAT) 0.4 MG SL tablet Use one pill every 5 minutes X 3 if needed for chest pain. Contact MD if pain does not improve  . senna (SENOKOT)  8.6 MG TABS tablet Take 2 tablets (17.2 mg total) by mouth at bedtime.  Marland Kitchen spironolactone (ALDACTONE) 25 MG tablet Take 0.5 tablets (12.5 mg total) by mouth daily.  Marland Kitchen warfarin (COUMADIN) 5 MG tablet Take 1 tablet daily or as directed by coumadin clinic    FAMILY HISTORY:  His has no family status information on file.   Wife was not around to answer. Limited information.   SOCIAL HISTORY: He  reports that he has been smoking.  He does not have any smokeless tobacco history on file. He reports that he does not drink alcohol. Married and has 2 children. Not working.    REVIEW OF SYSTEMS:   Unable to obtain as pt is intubated. Per wife, pt with generalized weakness since discharge but is able to walk with a walker. Also, with chronic cough and dyspnea with ADLs.  (-) recent fevers, chills, cp, abd pain, edema.  14 point ROS done and everything else is (-) other than the ones mentioned.   SUBJECTIVE:  Intubated, sedated.  VITAL SIGNS: BP 191/109 mmHg  Pulse 113  Temp(Src) 99 F (37.2 C) (Rectal)  Resp 9  Ht _0  (1.727 m)  Wt 72.576 kg (160 lb)  BMI 24.33 kg/m2  SpO2 100%  HEMODYNAMICS:    VENTILATOR SETTINGS: Vent Mode:  [-] PRVC FiO2 (%):  [60 %-100 %] 60 % Set Rate:  [15 bmp] 15 bmp Vt Set:  [550 mL] 550 mL PEEP:  [5 cmH20] 5 cmH20 Plateau Pressure:  [31 cmH20] 31 cmH20  INTAKE / OUTPUT:    PHYSICAL EXAMINATION: General:  Sedated. CN grossly intact but L pupil was 2-3 mm and R pupil was 3-4 mm.  Neuro:  CN grossly intact but pupils unequal as mentioned. (-) lateralizing signs seen.  HEENT:  (-) facial asymetry. (-) NVD. ETT in place.  Cardiovascular:  Good si/s2. (-) s3/m/r/g.  Lungs:  Fair ae. Crackles bibasilar. (-) rhonchi or wheezing Abdomen:  (+) BS soft, NT. Distended. (-) masses/tenderness Musculoskeletal:  Tone and bulk looked N Skin:  Warm and dry. Trace edema. (-) clubbing/cyanosis  LABS:  BMET  Recent Labs Lab 12/08/15 0947 12/11/15 0506  NA 136 139  K 4.9 4.1  CL 99 100*  CO2 30  --   BUN 15 13  CREATININE 1.38* 1.20  GLUCOSE 324* 467*    Electrolytes  Recent Labs Lab 12/08/15 0947  CALCIUM 8.8    CBC  Recent Labs Lab 12/11/15 0500 12/11/15 0506  WBC 6.7  --   HGB 10.9* 14.6  HCT 36.8* 43.0  PLT 311  --     Coag's  Recent Labs Lab 12/08/15 0924 12/11/15 0500  APTT  --  31  INR 1.8 1.68*    Sepsis Markers  Recent Labs Lab 12/11/15 0509  LATICACIDVEN 3.45*    ABG  Recent Labs Lab 12/11/15 0558  PHART 7.137*  PCO2ART 88.3*  PO2ART 374.0*    Liver Enzymes No  results for input(s): AST, ALT, ALKPHOS, BILITOT, ALBUMIN in the last 168 hours.  Cardiac Enzymes No results for input(s): TROPONINI, PROBNP in the last 168 hours.  Glucose  Recent Labs Lab 12/11/15 0558  GLUCAP 455*    Imaging Dg Chest Portable 1 View  12/11/2015  CLINICAL DATA:  Endotracheal tube placement EXAM: PORTABLE CHEST 1 VIEW COMPARISON:  None. FINDINGS: Endotracheal tube present with tip measuring 3.7 cm above the carina. Enteric tube is present with tip off the field of view but below the left hemidiaphragm.  Proximal side hole is positioned just below the expected location of the EG junction. Heart size is normal. There are bilateral perihilar infiltrates suggesting edema or pneumonia. No blunting of costophrenic angles. No pneumothorax. IMPRESSION: Appliances appear in satisfactory location. Bilateral perihilar infiltrates suggesting edema or pneumonia. Electronically Signed   By: Lucienne Capers M.D.   On: 12/11/2015 06:16     STUDIES:  Cranial CT scan 12/11/15 >  CULTURES: MRSA 5/20 > Blood culture 5/20 > Trache asp 5/20 >   ANTIBIOTICS: Cefepime 5/20 > Vanc 5/20  SIGNIFICANT EVENTS: 5/20 admit for resp distress/AMS. Intubated.   LINES/TUBES: (-)  DISCUSSION: Pt is admitted for acute on chronic SOB with AMS.   Presentation is c/w Flash Pulm edema. Woke up acutely SOB, BP elevated in the 200s on admission, CXR with Bilateral infiltrates suggestive of pulm edema. Pt known to have CHF with EF 30-35% and CAD with recent DES.   Pt however was tachypneic, tachycardic, had elevated WBC and lactate,and was febrile to 101. Sepsis protocol/bundle was also initiated by ED. Cultures have been drawn and Abx started. Getting IVF as part of hydration (30 mls/kg).  Concern for a new CVA as well given AMS.    ASSESSMENT / PLAN:  PULMONARY A: Acute Hypoxemic Hypercapneic Resp Fx 2/2 Flash Pulm Edema/Acute CHF exacerbation. Differentials: HCAP given fevers, recent admit.  Pt with MSSA in sputum in 09/2015. Also, possible demand ischemia.  COPD likely > not in exacerbation P:   Vent bundle.  Sepsis bundle has been initiated Cont abx RR has been adjusted. Need a rpt ABG at 8am. BD Will rpt CXR this am at 10 am (check if he will need diuresis) and f/u ABG at 8am.   CARDIOVASCULAR A:  Flash Pulm edema Demand ischemia Acute CHF exacerbation EF 30-35% P:  Trend troponin May need diuresis if rpt CXR is more congested  RENAL A:   No issues P:   Getting IVF resuscitation at the ED as part of the sepsis bundle  GASTROINTESTINAL A:   No issues P:   NPO Consider start TF later this day  HEMATOLOGIC A:   No issues P:  Switch coumadin to heparin per pharmacy consult once ct head is (-)  INFECTIOUS A:   Possible HCAP. Recent MSSA PNA. P:   Sepsis bundle has been initiated -- cultures, PCT, lactate, Abx, IVF 30 mls/kg underway  ENDOCRINE A:   No issues P:   CBG q 4 while NPO > sliding scale  NEUROLOGIC A:   CVA in R frontal area in 09/2015 AMS > likely 2/2 hypercapnea/sepsis. R/O acute CVA P:   Plan for cranial Ct scan. May need MRI. If head CT is (-), will start heparin drip.  RASS goal: 0 - -1 Propofol, fentanyl   FAMILY  - Updates: Family (wife) updated at bedside.   - Inter-disciplinary family meet or Palliative Care meeting due by:  12/18/15  Critical care time spent on this pt was 45 minutes.   Monica Becton, MD [facility] Pager: [phone] After 3 pm or if no response, call [phone]  12/11/2015, 6:24 AM

## 2015-12-11 NOTE — ED Notes (Signed)
Pt chewing on ET tube, first purposeful movements noted since arrival.

## 2015-12-11 NOTE — ED Notes (Signed)
Blood being coughed up from around ET tube at this time

## 2015-12-11 NOTE — Progress Notes (Signed)
Pharmacy Antibiotic Note  Eddie [name] is a 65 y.o. male admitted on 12/11/2015 with pneumonia.  Pharmacy has been consulted for Vancomycin and Cefepime dosing. Cefepime 2gm IV and Vanc 1gm IV given in ED ~0600.  Plan: Cefepime 1gm IV q24h Vancomycin 1gm Iv q12h Will f/u micro data, renal function, and pt's clinical condition Vanc trough prn   Height: 5\' 8"  (172.7 cm) Weight: 160 lb (72.576 kg) IBW/kg (Calculated) : 68.4  Temp (24hrs), Avg:99.6 F (37.6 C), Min:98.6 F (37 C), Max:101.1 F (38.4 C)   Recent Labs Lab 12/08/15 0947 12/11/15 0500 12/11/15 0506 12/11/15 0509  WBC  --  6.7  --   --   CREATININE 1.38* 1.49* 1.20  --   LATICACIDVEN  --   --   --  3.45*    Estimated Creatinine Clearance: 60.2 mL/min (by C-G formula based on Cr of 1.2).    No Known Allergies  Antimicrobials this admission: 5/20 Vanc >>  5/20 Cefepime >>   Dose adjustments this admission: n/a  Microbiology results: 5/20 BCx x2:  5/20 UCx:    Thank you for allowing pharmacy to be a part of this patient's care.  Christoper Fabian, PharmD, BCPS Clinical pharmacist, pager [phone] 12/11/2015 6:26 AM

## 2015-12-11 NOTE — ED Notes (Signed)
Pt arrive via EMS from home with c/o [facility], wife called 911 and stated that patient was talking and with EMS not communicating at all. Mottling present. Rales in all fields. Hx cardiac arrest.

## 2015-12-11 NOTE — Progress Notes (Signed)
CRITICAL VALUE ALERT  Critical value received:  Positive blood cx-aerobic and anaerobic bottles, gram neg rods, blood cx ID-enterobacter, e coli  Date of notification:  12/11/2015  Time of notification:  2215  Critical value read back:Yes.    Nurse who received alert:  Terrilyn Saver  MD notified (1st page):  Dr. Molli Knock  Time of first page:  2215  MD notified (2nd page):  Time of second page:  Responding MD:  Dr. Molli Knock  Time MD responded:  2215

## 2015-12-12 ENCOUNTER — Other Ambulatory Visit (HOSPITAL_COMMUNITY): Payer: Self-pay

## 2015-12-12 ENCOUNTER — Inpatient Hospital Stay (HOSPITAL_COMMUNITY): Payer: BLUE CROSS/BLUE SHIELD

## 2015-12-12 DIAGNOSIS — I5043 Acute on chronic combined systolic (congestive) and diastolic (congestive) heart failure: Secondary | ICD-10-CM

## 2015-12-12 DIAGNOSIS — R7881 Bacteremia: Secondary | ICD-10-CM

## 2015-12-12 DIAGNOSIS — J9601 Acute respiratory failure with hypoxia: Secondary | ICD-10-CM

## 2015-12-12 LAB — BLOOD CULTURE ID PANEL (REFLEXED)
Acinetobacter baumannii: NOT DETECTED
CANDIDA KRUSEI: NOT DETECTED
CANDIDA PARAPSILOSIS: NOT DETECTED
CARBAPENEM RESISTANCE: NOT DETECTED
Candida albicans: NOT DETECTED
Candida glabrata: NOT DETECTED
Candida tropicalis: NOT DETECTED
ENTEROBACTERIACEAE SPECIES: NOT DETECTED
Enterobacter cloacae complex: NOT DETECTED
Enterococcus species: NOT DETECTED
Escherichia coli: DETECTED — AB
Haemophilus influenzae: NOT DETECTED
KLEBSIELLA OXYTOCA: NOT DETECTED
KLEBSIELLA PNEUMONIAE: NOT DETECTED
Listeria monocytogenes: NOT DETECTED
Methicillin resistance: NOT DETECTED
Neisseria meningitidis: NOT DETECTED
PSEUDOMONAS AERUGINOSA: NOT DETECTED
Proteus species: NOT DETECTED
STAPHYLOCOCCUS SPECIES: NOT DETECTED
STREPTOCOCCUS AGALACTIAE: NOT DETECTED
STREPTOCOCCUS PNEUMONIAE: NOT DETECTED
Serratia marcescens: NOT DETECTED
Staphylococcus aureus (BCID): NOT DETECTED
Streptococcus pyogenes: NOT DETECTED
Streptococcus species: NOT DETECTED
Vancomycin resistance: NOT DETECTED

## 2015-12-12 LAB — GLUCOSE, CAPILLARY
GLUCOSE-CAPILLARY: 123 mg/dL — AB (ref 65–99)
GLUCOSE-CAPILLARY: 189 mg/dL — AB (ref 65–99)
GLUCOSE-CAPILLARY: 91 mg/dL (ref 65–99)
Glucose-Capillary: 72 mg/dL (ref 65–99)
Glucose-Capillary: 137 mg/dL — ABNORMAL HIGH (ref 65–99)
Glucose-Capillary: 151 mg/dL — ABNORMAL HIGH (ref 65–99)
Glucose-Capillary: 191 mg/dL — ABNORMAL HIGH (ref 65–99)

## 2015-12-12 LAB — CBC
HEMATOCRIT: 26.4 % — AB (ref 39.0–52.0)
Hemoglobin: 8.2 g/dL — ABNORMAL LOW (ref 13.0–17.0)
MCH: 24.6 pg — AB (ref 26.0–34.0)
MCHC: 31.1 g/dL (ref 30.0–36.0)
MCV: 79 fL (ref 78.0–100.0)
PLATELETS: 268 10*3/uL (ref 150–400)
RBC: 3.34 MIL/uL — ABNORMAL LOW (ref 4.22–5.81)
RDW: 15.9 % — AB (ref 11.5–15.5)
WBC: 12 10*3/uL — ABNORMAL HIGH (ref 4.0–10.5)

## 2015-12-12 LAB — BASIC METABOLIC PANEL
Anion gap: 8 (ref 5–15)
BUN: 12 mg/dL (ref 6–20)
CHLORIDE: 107 mmol/L (ref 101–111)
CO2: 25 mmol/L (ref 22–32)
CREATININE: 1.31 mg/dL — AB (ref 0.61–1.24)
Calcium: 8 mg/dL — ABNORMAL LOW (ref 8.9–10.3)
GFR calc Af Amer: >60 mL/min (ref >60)
GFR calc non Af Amer: 56 mL/min — ABNORMAL LOW (ref >60)
GLUCOSE: 77 mg/dL (ref 65–99)
Potassium: 3.4 mmol/L — ABNORMAL LOW (ref 3.5–5.1)
Sodium: 140 mmol/L (ref 135–145)

## 2015-12-12 LAB — HEPARIN LEVEL (UNFRACTIONATED): Heparin Unfractionated: 0.48 IU/mL (ref 0.30–0.70)

## 2015-12-12 LAB — ECHOCARDIOGRAM COMPLETE
HEIGHTINCHES: 68 in
WEIGHTICAEL: 2589.08 [oz_av]

## 2015-12-12 LAB — PROTIME-INR
INR: 1.74 — ABNORMAL HIGH (ref 0.00–1.49)
Prothrombin Time: 20.3 seconds — ABNORMAL HIGH (ref 11.6–15.2)

## 2015-12-12 MED ORDER — WARFARIN SODIUM 5 MG PO TABS
5.0000 mg | ORAL_TABLET | Freq: Once | ORAL | Status: AC
  Administered 2015-12-12: 5 mg via ORAL
  Filled 2015-12-12: qty 1

## 2015-12-12 MED ORDER — INSULIN ASPART 100 UNIT/ML ~~LOC~~ SOLN
0.0000 [IU] | Freq: Three times a day (TID) | SUBCUTANEOUS | Status: DC
  Administered 2015-12-12: 3 [IU] via SUBCUTANEOUS
  Administered 2015-12-12: 2 [IU] via SUBCUTANEOUS
  Administered 2015-12-13: 8 [IU] via SUBCUTANEOUS
  Administered 2015-12-13: 5 [IU] via SUBCUTANEOUS
  Administered 2015-12-13: 3 [IU] via SUBCUTANEOUS
  Administered 2015-12-14: 8 [IU] via SUBCUTANEOUS
  Administered 2015-12-14 (×2): 3 [IU] via SUBCUTANEOUS
  Administered 2015-12-15: 5 [IU] via SUBCUTANEOUS
  Administered 2015-12-15: 3 [IU] via SUBCUTANEOUS

## 2015-12-12 MED ORDER — IPRATROPIUM-ALBUTEROL 0.5-2.5 (3) MG/3ML IN SOLN: 3.0000 mL | RESPIRATORY_TRACT | Status: DC | PRN

## 2015-12-12 MED ORDER — DEXTROSE 5 % IV SOLN
2.0000 g | INTRAVENOUS | Status: DC
  Administered 2015-12-12 – 2015-12-13 (×2): 2 g via INTRAVENOUS
  Filled 2015-12-12 (×3): qty 2

## 2015-12-12 MED ORDER — INSULIN ASPART 100 UNIT/ML ~~LOC~~ SOLN
0.0000 [IU] | Freq: Every day | SUBCUTANEOUS | Status: DC
  Administered 2015-12-13: 2 [IU] via SUBCUTANEOUS

## 2015-12-12 MED ORDER — DEXTROSE 5 % IV SOLN
1.0000 g | Freq: Three times a day (TID) | INTRAVENOUS | Status: DC
  Filled 2015-12-12 (×2): qty 1

## 2015-12-12 MED ORDER — ASPIRIN 81 MG PO CHEW
81.0000 mg | CHEWABLE_TABLET | Freq: Every day | ORAL | Status: DC
  Administered 2015-12-12 – 2015-12-15 (×4): 81 mg via ORAL
  Filled 2015-12-12 (×4): qty 1

## 2015-12-12 MED ORDER — ACETAMINOPHEN 325 MG PO TABS
650.0000 mg | ORAL_TABLET | Freq: Four times a day (QID) | ORAL | Status: DC | PRN
  Administered 2015-12-12 – 2015-12-15 (×4): 650 mg via ORAL
  Filled 2015-12-12 (×4): qty 2

## 2015-12-12 MED ORDER — ATORVASTATIN CALCIUM 80 MG PO TABS
80.0000 mg | ORAL_TABLET | Freq: Every day | ORAL | Status: DC
  Administered 2015-12-12 – 2015-12-14 (×3): 80 mg via ORAL
  Filled 2015-12-12 (×3): qty 1

## 2015-12-12 MED ORDER — LISINOPRIL 5 MG PO TABS
5.0000 mg | ORAL_TABLET | Freq: Every day | ORAL | Status: DC
  Administered 2015-12-12 – 2015-12-15 (×4): 5 mg via ORAL
  Filled 2015-12-12 (×5): qty 1

## 2015-12-12 MED ORDER — SPIRONOLACTONE 25 MG PO TABS
12.5000 mg | ORAL_TABLET | Freq: Every day | ORAL | Status: DC
  Administered 2015-12-12 – 2015-12-15 (×4): 12.5 mg via ORAL
  Filled 2015-12-12 (×4): qty 1

## 2015-12-12 MED ORDER — WARFARIN - PHARMACIST DOSING INPATIENT
Freq: Every day | Status: DC
  Administered 2015-12-12: 18:00:00

## 2015-12-12 MED ORDER — CARVEDILOL 6.25 MG PO TABS
6.2500 mg | ORAL_TABLET | Freq: Two times a day (BID) | ORAL | Status: DC
  Administered 2015-12-12 – 2015-12-15 (×6): 6.25 mg via ORAL
  Filled 2015-12-12 (×6): qty 1

## 2015-12-12 MED ORDER — CLOPIDOGREL BISULFATE 75 MG PO TABS
75.0000 mg | ORAL_TABLET | Freq: Every day | ORAL | Status: DC
  Administered 2015-12-12 – 2015-12-15 (×4): 75 mg via ORAL
  Filled 2015-12-12 (×4): qty 1

## 2015-12-12 NOTE — H&P (Signed)
PULMONARY / CRITICAL CARE MEDICINE   Name: Eddie [name] MRN: [id number] DOB: Feb 14, 1951    ADMISSION DATE:  12/11/2015  REFERRING MD:  Dr. Bebe Shaggy  CHIEF COMPLAINT:  SOB  SUBJECTIVE:  Tolerating SBT.  C/o feeling hungry.  Wants tubes out.  VITAL SIGNS: BP 126/84 mmHg  Pulse 94  Temp(Src) 98.4 F (36.9 C) (Core (Comment))  Resp 24  Ht  (1.727 m)  Wt 161 lb 13.1 oz (73.4 kg)  BMI 24.61 kg/m2  SpO2 100%  VENTILATOR SETTINGS: Vent Mode:  [-] PSV;CPAP FiO2 (%):  [40 %-60 %] 40 % Set Rate:  [22 bmp] 22 bmp Vt Set:  [550 mL] 550 mL PEEP:  [5 cmH20] 5 cmH20 Pressure Support:  [10 cmH20] 10 cmH20 Plateau Pressure:  [20 cmH20-21 cmH20] 21 cmH20  INTAKE / OUTPUT: I/O last 3 completed shifts: In: 2737.2 [I.V.:2147.2; NG/GT:90; IV Piggyback:500] Out: 1120 [Urine:870; Emesis/NG output:250]  PHYSICAL EXAMINATION: General: awake Neuro: follows commands HEENT: ETT Cardiac: regular Chest: no wheeze Abd: soft, non tender Ext: no edema Skin: no rashes  LABS:  BMET  Recent Labs Lab 12/08/15 0947 12/11/15 0500 12/11/15 0506 12/12/15 0341  NA 136 138 139 140  K 4.9 4.1 4.1 3.4*  CL 99 99* 100* 107  CO2 30 27  --  25  BUN CREATININE 1.38* 1.49* 1.20 1.31*  GLUCOSE 324* 480* 467* 77    Electrolytes  Recent Labs Lab 12/08/15 0947 12/11/15 0500 12/12/15 0341  CALCIUM 8.8 8.5* 8.0*    CBC  Recent Labs Lab 12/11/15 0500 12/11/15 0506 12/12/15 0341  WBC 6.7  --  12.0*  HGB 10.9* 14.6 8.2*  HCT 36.8* 43.0 26.4*  PLT 311  --  268    Coag's  Recent Labs Lab 12/08/15 0924 12/11/15 0500 12/12/15 0341  APTT  --  31  --   INR 1.8 1.68* 1.74*    Sepsis Markers  Recent Labs Lab 12/11/15 0509 12/11/15 0720 12/11/15 1114  LATICACIDVEN 3.45* 1.9 1.2  PROCALCITON  --  3.91  --     ABG  Recent Labs Lab 12/11/15 0558 12/11/15 0954  PHART 7.137* 7.405  PCO2ART 88.3* 37.8  PO2ART 374.0* 167*    Liver Enzymes  Recent  Labs Lab 12/11/15 0500  AST 22  ALT 15*  ALKPHOS 155*  BILITOT 0.4  ALBUMIN 2.8*    Cardiac Enzymes  Recent Labs Lab 12/11/15 0720 12/11/15 1114 12/11/15 1839  TROPONINI 0.06* 0.07* 0.07*    Glucose  Recent Labs Lab 12/11/15 1151 12/11/15 1623 12/11/15 1945 12/12/15 0005 12/12/15 0356 12/12/15 0749  GLUCAP 376* 316* 221* 91 72 123*    Imaging No results found.   STUDIES:  5/20 CT head >> microvascular ischemic changes  CULTURES: 5/20 Blood >> GNR >> 5/20 Urine >> GNR >>   ANTIBIOTICS: 5/20 Vancomycin >> 5/21 5/20 Cefepime >> 5/21 5/21 Rocephin >>  SIGNIFICANT EVENTS: 5/20 Admit  LINES/TUBES: 5/20 ETT >>   DISCUSSION: 65 yo male presented with dyspnea, acute encephalopathy, respiratory failure, HTN urgency, acute pulmonary edema, fever 101F, lactic acidosis.  Found to have UTI with bacteremia.  PMHx of CAD s/p DES March 2017, systolic CHF with EF 30 to 35%, CVA.   ASSESSMENT / PLAN:  PULMONARY A: Acute hypoxic respiratory failure 2nd to acute pulmonary edema. P:   Extubate 5/20 Prn BDs F/u CXR intermittently  CARDIOVASCULAR A:  HTN urgency. Acute on chronic combined CHF. CAD s/p recent DES. Hx of HLD.  P:  Resume ASA, lipitor, coreg, plavix, lisinopril, aldactone  RENAL A:   Hypokalemia. CKD 2. P:   Monitor renal fx Replace electrolytes as needed  GASTROINTESTINAL A:   Nutrition. P:   Advance diet after extubation Protonix for SUP >> likely can d/c soon after extubation  HEMATOLOGIC A:   Anemia of chronic disease and critical illness. P:  F/u CBC  INFECTIOUS A:   Sepsis from UTI with GNR bacteremia. P:   Change to rocephin D/c vancomycin, cefepime  ENDOCRINE A:   DM. P:   SSI Hold outpt metformin for now  NEUROLOGIC A:   Acute metabolic encephalopathy >> resolved. Hx of CVA March 2017. P:   Resume coumadin per pharmacy and d/c heparin gtt when IR > 2  CC time 34 minutes.  Coralyn Helling, MD [facility]  Pulmonary/Critical Care 12/12/2015, 10:30 AM Pager:  [phone] After 3pm call: [phone]

## 2015-12-12 NOTE — Progress Notes (Signed)
eLink Physician-Brief Progress Note Patient Name: Eddie [name] DOB: 11/23/1950 MRN: [id number]   Date of Service  12/12/2015  HPI/Events of Note  Headache - patient requests Tylenol.  eICU Interventions  Will order Tylenol 650 mg PO Q 6 hours PRN headache.      Intervention Category Intermediate Interventions: Pain - evaluation and management  Elray Dains Dennard Nip 12/12/2015, 9:18 PM

## 2015-12-12 NOTE — Progress Notes (Signed)
  Echocardiogram 2D Echocardiogram has been performed.  Arvil Chaco 12/12/2015, 12:51 PM

## 2015-12-12 NOTE — Procedures (Signed)
Extubation Procedure Note  Patient Details:   Name: Eddie [name] DOB: 1951/01/27 MRN: [id number]   Airway Documentation:     Evaluation  O2 sats: stable throughout Complications: No apparent complications Patient did tolerate procedure well. Bilateral Breath Sounds: Expiratory wheezes, Diminished, Coarse crackles   Yes   Pt. Was extubated to a 4L [place] without any complications, dyspnea or stridor noted. Pt. Was instructed on IS x 5, highest goal achieved was 1,047mL.  Laysha Childers, Margaretmary Dys 12/12/2015, 10:44 AM

## 2015-12-12 NOTE — Progress Notes (Signed)
ANTICOAGULATION CONSULT NOTE - Follow Up Consult ANTIBIOTIC CONSULT NOTE - Follow Up Consult  Pharmacy Consult for Heparin and Coumadin; Ceftriaxone Indication: hx CVA; Ecoli bacteremia  No Known Allergies  Patient Measurements: Height: 5\' 8"  (172.7 cm) Weight: 161 lb 13.1 oz (73.4 kg) IBW/kg (Calculated) : 68.4  Vital Signs: Temp: 98.4 F (36.9 C) (05/21 1000) Temp Source: Core (Comment) (05/21 0800) BP: 126/84 mmHg (05/21 1000) Pulse Rate: 94 (05/21 1000)  Labs:  Recent Labs  12/11/15 0500 12/11/15 0506 12/11/15 0720 12/11/15 1114 12/11/15 1611 12/11/15 1839 12/11/15 2200 12/12/15 0341  HGB 10.9* 14.6  --   --   --   --   --  8.2*  HCT 36.8* 43.0  --   --   --   --   --  26.4*  PLT 311  --   --   --   --   --   --  268  APTT 31  --   --   --   --   --   --   --   LABPROT 19.8*  --   --   --   --   --   --  20.3*  INR 1.68*  --   --   --   --   --   --  1.74*  HEPARINUNFRC  --   --   --   --  0.34  --  0.52 0.48  CREATININE 1.49* 1.20  --   --   --   --   --  1.31*  TROPONINI 0.04*  --  0.06* 0.07*  --  0.07*  --   --     Estimated Creatinine Clearance: 55.1 mL/min (by C-G formula based on Cr of 1.31).  Medications:  Heparin @ 1100 units/hr  Assessment: 64yom on coumadin pta for hx CVA (09/2015). He was intubated on admission, coumadin held, and he was started on IV heparin. Heparin level is at goal 0.48. Patient was extubated this morning so coumadin to resume. INR 1.74. Noted drop in Hgb 10.9 > 14.6 > 8.2 - will watch closely. Last [facility] clinic visit on 5/18 dose was 5mg  daily except 7.5mg  on Mon/Fri  He was also started on vancomycin and cefepime yesterday for presumed HCAP. Blood cultures now positive 2/2 for Ecoli. Antibiotics will be changed to ceftriaxone.  5/20 Vancomycin >> 5/21 5/20 Cefepime >> 5/21 5/21 Ceftriaxone >>  5/20 BCx x2: 2/2 GNR >> Ecoli 5/20 UCx:  5/20 Resp Cx: MRSA PCR negative  Goal of Therapy:  INR 2-3 Heparin level 0.3-0.5  units/ml Monitor platelets by anticoagulation protocol: Yes   Plan:  1) Continue heparin at 1100 units/hr 2) Coumadin 5mg  x 1 3) Daily heparin level, INR, CBC 4) Ceftriaxone 2g IV q24 5) Follow up culture sensitivities  Fredrik Rigger 12/12/2015,10:52 AM

## 2015-12-13 DIAGNOSIS — G9340 Encephalopathy, unspecified: Secondary | ICD-10-CM

## 2015-12-13 DIAGNOSIS — A4151 Sepsis due to Escherichia coli [E. coli]: Secondary | ICD-10-CM

## 2015-12-13 LAB — PROTIME-INR
INR: 1.82 — AB (ref 0.00–1.49)
PROTHROMBIN TIME: 21 s — AB (ref 11.6–15.2)

## 2015-12-13 LAB — HEMOGLOBIN A1C
Hgb A1c MFr Bld: 9.6 % — ABNORMAL HIGH (ref 4.8–5.6)
MEAN PLASMA GLUCOSE: 229 mg/dL

## 2015-12-13 LAB — BASIC METABOLIC PANEL
ANION GAP: 8 (ref 5–15)
BUN: 12 mg/dL (ref 6–20)
CHLORIDE: 107 mmol/L (ref 101–111)
CO2: 24 mmol/L (ref 22–32)
Calcium: 8 mg/dL — ABNORMAL LOW (ref 8.9–10.3)
Creatinine, Ser: 1.35 mg/dL — ABNORMAL HIGH (ref 0.61–1.24)
GFR calc non Af Amer: 54 mL/min — ABNORMAL LOW (ref >60)
GLUCOSE: 184 mg/dL — AB (ref 65–99)
Potassium: 3.5 mmol/L (ref 3.5–5.1)
Sodium: 139 mmol/L (ref 135–145)

## 2015-12-13 LAB — URINE CULTURE: Culture: >=100000 — AB

## 2015-12-13 LAB — GLUCOSE, CAPILLARY
GLUCOSE-CAPILLARY: 158 mg/dL — AB (ref 65–99)
GLUCOSE-CAPILLARY: 203 mg/dL — AB (ref 65–99)
GLUCOSE-CAPILLARY: 249 mg/dL — AB (ref 65–99)
Glucose-Capillary: 251 mg/dL — ABNORMAL HIGH (ref 65–99)
Glucose-Capillary: 240 mg/dL — ABNORMAL HIGH (ref 65–99)

## 2015-12-13 LAB — CBC
HEMATOCRIT: 31.3 % — AB (ref 39.0–52.0)
Hemoglobin: 9.4 g/dL — ABNORMAL LOW (ref 13.0–17.0)
MCH: 24.2 pg — AB (ref 26.0–34.0)
MCHC: 30 g/dL (ref 30.0–36.0)
MCV: 80.5 fL (ref 78.0–100.0)
Platelets: 289 10*3/uL (ref 150–400)
RBC: 3.89 MIL/uL — AB (ref 4.22–5.81)
RDW: 16.2 % — ABNORMAL HIGH (ref 11.5–15.5)
WBC: 9.8 10*3/uL (ref 4.0–10.5)

## 2015-12-13 LAB — HEPARIN LEVEL (UNFRACTIONATED): Heparin Unfractionated: 0.37 IU/mL (ref 0.30–0.70)

## 2015-12-13 MED ORDER — WARFARIN SODIUM 7.5 MG PO TABS
7.5000 mg | ORAL_TABLET | Freq: Once | ORAL | Status: AC
  Administered 2015-12-13: 7.5 mg via ORAL
  Filled 2015-12-13: qty 1

## 2015-12-13 MED ORDER — SENNOSIDES-DOCUSATE SODIUM 8.6-50 MG PO TABS
1.0000 | ORAL_TABLET | Freq: Two times a day (BID) | ORAL | Status: DC
  Administered 2015-12-13 – 2015-12-15 (×5): 1 via ORAL
  Filled 2015-12-13 (×8): qty 1

## 2015-12-13 MED ORDER — PANTOPRAZOLE SODIUM 40 MG PO TBEC
40.0000 mg | DELAYED_RELEASE_TABLET | Freq: Every day | ORAL | Status: DC
  Administered 2015-12-13 – 2015-12-14 (×2): 40 mg via ORAL
  Filled 2015-12-13 (×2): qty 1

## 2015-12-13 NOTE — Progress Notes (Signed)
PULMONARY / CRITICAL CARE MEDICINE   Name: Eddie [name] MRN: [id number] DOB: 1951/01/22    ADMISSION DATE:  12/11/2015  REFERRING MD:  Dr. Bebe Shaggy  CHIEF COMPLAINT:  SOB  SUBJECTIVE: Patient successfully extubated 5/21. Patient reports improving dyspnea & no cough. No abdominal pain but is having abdominal distension & constipation. No nausea. Antibiotics tapered to Rocephin given urine culture sensitivities.  REVIEW OF SYSTEMS:  No chest pain or pressure. No fever, chills, or sweats.  VITAL SIGNS: BP 117/76 mmHg  Pulse 74  Temp(Src) 99 F (37.2 C) (Oral)  Resp 20  Ht  (1.727 m)  Wt 164 lb 10.9 oz (74.7 kg)  BMI 25.05 kg/m2  SpO2 100%  VENTILATOR SETTINGS:    INTAKE / OUTPUT: I/O last 3 completed shifts: In: 1185.4 [I.V.:795.4; NG/GT:90; IV Piggyback:300] Out: 1205 [Urine:1105; Emesis/NG output:100]  PHYSICAL EXAMINATION: General: Awake. Alert. No distress. Sitting up in chair having eaten lunch. Neuro: CN grossly in tact. Grossly nonfocal. Following commands. A & Ox4. HEENT: MMM. Poor dentition. No scleral icterus. Cardiac: Regular rate. No edema. No JVD. Pulmonary: CTAB. Normal work of breathing on [place] oxygen. Abd: Soft. Nondistended. [place]edner. Hypoactive BS. Integument:  Warm & dry. No rash on exposed skin.  LABS:  BMET  Recent Labs Lab 12/11/15 0500 12/11/15 0506 12/12/15 0341 12/13/15 0228  NA 138 139 140 139  K 4.1 4.1 3.4* 3.5  CL 99* 100* 107 107  CO2 27  --  25 24  BUN CREATININE 1.49* 1.20 1.31* 1.35*  GLUCOSE 480* 467* 77 184*    Electrolytes  Recent Labs Lab 12/11/15 0500 12/12/15 0341 12/13/15 0228  CALCIUM 8.5* 8.0* 8.0*    CBC  Recent Labs Lab 12/11/15 0500 12/11/15 0506 12/12/15 0341 12/13/15 0228  WBC 6.7  --  12.0* 9.8  HGB 10.9* 14.6 8.2* 9.4*  HCT 36.8* 43.0 26.4* 31.3*  PLT 311  --  268 289    Coag's  Recent Labs Lab 12/11/15 0500 12/12/15 0341 12/13/15 0228  APTT 31  --   --   INR  1.68* 1.74* 1.82*    Sepsis Markers  Recent Labs Lab 12/11/15 0509 12/11/15 0720 12/11/15 1114  LATICACIDVEN 3.45* 1.9 1.2  PROCALCITON  --  3.91  --     ABG  Recent Labs Lab 12/11/15 0558 12/11/15 0954  PHART 7.137* 7.405  PCO2ART 88.3* 37.8  PO2ART 374.0* 167*    Liver Enzymes  Recent Labs Lab 12/11/15 0500  AST 22  ALT 15*  ALKPHOS 155*  BILITOT 0.4  ALBUMIN 2.8*    Cardiac Enzymes  Recent Labs Lab 12/11/15 0720 12/11/15 1114 12/11/15 1839  TROPONINI 0.06* 0.07* 0.07*    Glucose  Recent Labs Lab 12/12/15 1554 12/12/15 1956 12/12/15 2204 12/13/15 0002 12/13/15 0758 12/13/15 1112  GLUCAP 151* 189* 191* 203* 158* 249*    Imaging No results found.   STUDIES:  5/20 CT head >> microvascular ischemic changes 5/21 TTE:  Moderate LVH w/ EF 15-20% & severe diffuse hypokinesis. Grade 1 diastolic dysfunction. RV normal in size & function. No AS or AR. Mild MR without MS. Mild PR w/o PS. Mild TR. Small pericardial effusion.  CULTURES: 5/20 Blood:  E coli 5/20 Urine:  E coli  5/20 Tracheal Asp >>  ANTIBIOTICS: 5/20 Vancomycin >> 5/21 5/20 Cefepime >> 5/21 5/21 RocepOLANREWAJU OSBORNGNIFICANT EVENTS: 5/20 Admit  LINES/TUBES: ETT 5/20-5/21 Foley 5/20 PIV x2  ASSESSMENT / PLAN:  PULMONARY A: Acute Hypoxic  Respiratory Failure - Secondary to pulmonary edema.  P:   Continue weaning FiO2 for Sat >92% OOB to chair Duoneb prn  CARDIOVASCULAR A:  Hypertensive Urgency - Resolved. Acute on Chronic Combined CHF CAD s/p recent DES w/ STEMI & Cardiac Arrest March 2017 H/O HLD.  P:  Monitor on telemetry Continue Plavix & ASA 81mg  Continue Coreg, Aldactone, & Lisinopril Continue Lipitor  RENAL A:   Hypokalemia - Resolved CKD Stage II  P:   Monitoring UOP Trending electrolytes & renal function daily  GASTROINTESTINAL A:   Nutrition Constipation H/O PUD  P:   Carb Modified Diet Protonix PO qhs Starting Colace & Senna  BID  HEMATOLOGIC A:   Anemia of chronic disease and critical illness.  P:  Trending Cell Counts daily w/ CBC Heparin gtt per pharmacy protocol Transitioning to Coumadin Daily INR  INFECTIOUS A:   E coli UTI E coli Bacteremia  P:   Day #3 total of antibiotics - Rocephin currently Monitoring for fever Plan for repeat blood cultures in 48 hours  ENDOCRINE A:   DM - BG somewhat labile.  P:   SSI w/ accu-checks qAC & HS Hold outpt metformin for now  NEUROLOGIC A:   Acute Encephalopathy - Toxic metabolic. Resolved. H/O CVA March 2017  P:   Monitor for any new changes  TODAY'S SUMMARY:  65 yo male with PMHx of CAD s/p DES March 2017, systolic CHF with EF 30 to 35%, CVA. Found to have E coli UTI with bacteremia. Patient's encephalopathy has resolved. Given his hemodynamic stability and improvement in his oxygen requirement he is stable to transfer out to a telemetry monitored floor bed. Hospitalist service will assume care & PCCM will sign off as of 5/23.  Donna Christen Jamison Neighbor, M.D. [facility] Pulmonary & Critical Care Pager:  [phone] After 3pm or if no response, call [phone] 1:41 PM 12/13/2015

## 2015-12-13 NOTE — Progress Notes (Signed)
Patient arrived the floor on a hospital bed, assesment completed see flowsheet, placed on tele, ccmd notified, patient oriented to room and staff, call light within reach, will continue to monitor.

## 2015-12-13 NOTE — Progress Notes (Signed)
ANTICOAGULATION CONSULT NOTE - Follow Up Consult  Pharmacy Consult for heparin, warfarin Indication: stroke  No Known Allergies  Patient Measurements: Height: 5\' 8"  (172.7 cm) Weight: 164 lb 10.9 oz (74.7 kg) IBW/kg (Calculated) : 68.4 Heparin Dosing Weight: 72.6 kg  Vital Signs: Temp: 99 F (37.2 C) (05/22 1114) Temp Source: Oral (05/22 1114) BP: 117/76 mmHg (05/22 1200) Pulse Rate: 74 (05/22 1200)  Labs:  Recent Labs  12/11/15 0500 12/11/15 0506 12/11/15 0720 12/11/15 1114  12/11/15 1839 12/11/15 2200 12/12/15 0341 12/13/15 0228  HGB 10.9* 14.6  --   --   --   --   --  8.2* 9.4*  HCT 36.8* 43.0  --   --   --   --   --  26.4* 31.3*  PLT 311  --   --   --   --   --   --  268 289  APTT 31  --   --   --   --   --   --   --   --   LABPROT 19.8*  --   --   --   --   --   --  20.3* 21.0*  INR 1.68*  --   --   --   --   --   --  1.74* 1.82*  HEPARINUNFRC  --   --   --   --   < >  --  0.52 0.48 0.37  CREATININE 1.49* 1.20  --   --   --   --   --  1.31* 1.35*  TROPONINI 0.04*  --  0.06* 0.07*  --  0.07*  --   --   --   < > = values in this interval not displayed.  Estimated Creatinine Clearance: 53.5 mL/min (by C-G formula based on Cr of 1.35).  Assessment: 65 y.o. male admitted on 12/11/2015 with pneumonia.   PMH: recent CVA 09/2015, CAD with PCI, PUD  Anticoagulation: Pt on coumadin PTA for h/o recent CVA. Last [facility] clinic visit note 5/17 with INR 1.8 and dose 5mg  daily except for 7.5mg  on Mon and Fri.  On 1100 units heparin with level of 0.37 - INR today 1.82  Cardiovascular: recent admit 3/17 for cardiac arrest 2/2 anterior STEMI, s/p PCI/DES to LAD, EF 30-35%, trop negative HTN in 140s  Nephrology: sCr 1.35, K 3.5  Hematology / Oncology: H&H 9.4/31.3, Plt 289  Goal of Therapy:  INR 2-3 Heparin level 0.3 - 0.5 units/ml Monitor platelets by anticoagulation protocol: Yes   Plan:  Continue heparin at 1100 units/hr (aim 0.3-0.5) Warfarin 7.5 mg x 1  tonight Daily heparin level, INR, CBC  Isaac Bliss, PharmD, BCPS, [facility] Clinical Pharmacist Pager [phone] 12/13/2015 12:43 PM

## 2015-12-14 ENCOUNTER — Ambulatory Visit: Payer: BLUE CROSS/BLUE SHIELD | Admitting: Speech Pathology

## 2015-12-14 ENCOUNTER — Ambulatory Visit: Payer: BLUE CROSS/BLUE SHIELD

## 2015-12-14 DIAGNOSIS — R7881 Bacteremia: Secondary | ICD-10-CM | POA: Diagnosis present

## 2015-12-14 DIAGNOSIS — I10 Essential (primary) hypertension: Secondary | ICD-10-CM

## 2015-12-14 DIAGNOSIS — B9620 Unspecified Escherichia coli [E. coli] as the cause of diseases classified elsewhere: Secondary | ICD-10-CM | POA: Diagnosis present

## 2015-12-14 DIAGNOSIS — N390 Urinary tract infection, site not specified: Secondary | ICD-10-CM | POA: Diagnosis present

## 2015-12-14 DIAGNOSIS — E118 Type 2 diabetes mellitus with unspecified complications: Secondary | ICD-10-CM

## 2015-12-14 LAB — CBC
HEMATOCRIT: 29.4 % — AB (ref 39.0–52.0)
HEMOGLOBIN: 8.9 g/dL — AB (ref 13.0–17.0)
MCH: 24.6 pg — ABNORMAL LOW (ref 26.0–34.0)
MCHC: 30.3 g/dL (ref 30.0–36.0)
MCV: 81.2 fL (ref 78.0–100.0)
Platelets: 254 10*3/uL (ref 150–400)
RBC: 3.62 MIL/uL — AB (ref 4.22–5.81)
RDW: 15.8 % — ABNORMAL HIGH (ref 11.5–15.5)
WBC: 7.9 10*3/uL (ref 4.0–10.5)

## 2015-12-14 LAB — HEPARIN LEVEL (UNFRACTIONATED)
HEPARIN UNFRACTIONATED: 0.23 [IU]/mL — AB (ref 0.30–0.70)
HEPARIN UNFRACTIONATED: 0.36 [IU]/mL (ref 0.30–0.70)
Heparin Unfractionated: 0.33 IU/mL (ref 0.30–0.70)

## 2015-12-14 LAB — CULTURE, RESPIRATORY W GRAM STAIN
Culture: NORMAL
Gram Stain: NONE SEEN
Special Requests: NORMAL

## 2015-12-14 LAB — RENAL FUNCTION PANEL
ALBUMIN: 2.4 g/dL — AB (ref 3.5–5.0)
ANION GAP: 6 (ref 5–15)
BUN: 16 mg/dL (ref 6–20)
CALCIUM: 8.1 mg/dL — AB (ref 8.9–10.3)
CO2: 24 mmol/L (ref 22–32)
CREATININE: 1.11 mg/dL (ref 0.61–1.24)
Chloride: 106 mmol/L (ref 101–111)
GFR calc non Af Amer: >60 mL/min (ref >60)
Glucose, Bld: 192 mg/dL — ABNORMAL HIGH (ref 65–99)
PHOSPHORUS: 2.7 mg/dL (ref 2.5–4.6)
Potassium: 3.4 mmol/L — ABNORMAL LOW (ref 3.5–5.1)
SODIUM: 136 mmol/L (ref 135–145)

## 2015-12-14 LAB — GLUCOSE, CAPILLARY
GLUCOSE-CAPILLARY: 186 mg/dL — AB (ref 65–99)
GLUCOSE-CAPILLARY: 194 mg/dL — AB (ref 65–99)
Glucose-Capillary: 192 mg/dL — ABNORMAL HIGH (ref 65–99)
Glucose-Capillary: 251 mg/dL — ABNORMAL HIGH (ref 65–99)

## 2015-12-14 LAB — CULTURE, RESPIRATORY

## 2015-12-14 LAB — PROTIME-INR
INR: 1.94 — AB (ref 0.00–1.49)
Prothrombin Time: 22 seconds — ABNORMAL HIGH (ref 11.6–15.2)

## 2015-12-14 MED ORDER — CEFPODOXIME PROXETIL 200 MG PO TABS
200.0000 mg | ORAL_TABLET | Freq: Two times a day (BID) | ORAL | Status: DC
  Administered 2015-12-14 – 2015-12-15 (×3): 200 mg via ORAL
  Filled 2015-12-14 (×3): qty 1

## 2015-12-14 MED ORDER — WARFARIN SODIUM 7.5 MG PO TABS
7.5000 mg | ORAL_TABLET | Freq: Once | ORAL | Status: AC
  Administered 2015-12-14: 7.5 mg via ORAL
  Filled 2015-12-14: qty 1

## 2015-12-14 MED ORDER — HYDRALAZINE HCL 20 MG/ML IJ SOLN
5.0000 mg | Freq: Once | INTRAMUSCULAR | Status: AC
  Administered 2015-12-14: 5 mg via INTRAVENOUS
  Filled 2015-12-14: qty 1

## 2015-12-14 MED ORDER — POTASSIUM CHLORIDE CRYS ER 20 MEQ PO TBCR
40.0000 meq | EXTENDED_RELEASE_TABLET | Freq: Once | ORAL | Status: AC
  Administered 2015-12-14: 40 meq via ORAL
  Filled 2015-12-14: qty 2

## 2015-12-14 NOTE — Evaluation (Signed)
Physical Therapy Evaluation Patient Details Name: Eddie [name] MRN: [id number] DOB: 08-25-1950 Today's Date: 12/14/2015   History of Present Illness  Pt adm with acute respiratory failure due to pulmonary edema and hypertensive urgency. Pt intubated 5/20-5/21. PMH - recent rt CVA, NSTEMI, HTN, CHF  Clinical Impression  Pt admitted with above diagnosis and presents to PT with functional limitations due to deficits listed below (See PT problem list). Pt needs skilled PT to maximize independence and safety to allow discharge to home with wife. Pt with slight decline in mobility due to decr activity over past several days.    Follow Up Recommendations Outpatient PT (resume)    Equipment Recommendations  None recommended by PT    Recommendations for Other Services       Precautions / Restrictions Precautions Precautions: Fall      Mobility  Bed Mobility Overal bed mobility: Modified Independent                Transfers Overall transfer level: Modified independent                  Ambulation/Gait Ambulation/Gait assistance: Min guard Ambulation Distance (Feet): 500 Feet Assistive device: None Gait Pattern/deviations: Step-through pattern;Scissoring;Drifts right/left Gait velocity: decr Gait velocity interpretation: Below normal speed for age/gender General Gait Details: Unsteady gait but pt able to regain balance without physical assist  Stairs            Wheelchair Mobility    Modified Rankin (Stroke Patients Only)       Balance Overall balance assessment: Needs assistance Sitting-balance support: No upper extremity supported;Feet supported Sitting balance-Leahy Scale: Good     Standing balance support: No upper extremity supported;During functional activity Standing balance-Leahy Scale: Fair                               Pertinent Vitals/Pain Pain Assessment: No/denies pain    Home Living Family/patient expects to be  discharged to:: Private residence Living Arrangements: Spouse/significant other Available Help at Discharge: Family Type of Home: House Home Access: Stairs to enter   Secretary/administrator of Steps: 1 Home Layout: One level Home Equipment: None Additional Comments: Worked prior to recent CVA.    Prior Function Level of Independence: Needs assistance   Gait / Transfers Assistance Needed: Amb with cane outside and no device inside. Currently receiving OPPT.           Hand Dominance   Dominant Hand: Right    Extremity/Trunk Assessment   Upper Extremity Assessment: Overall WFL for tasks assessed           Lower Extremity Assessment: Overall WFL for tasks assessed         Communication   Communication: No difficulties  Cognition Arousal/Alertness: Awake/alert Behavior During Therapy: WFL for tasks assessed/performed Overall Cognitive Status: Within Functional Limits for tasks assessed                      General Comments      Exercises        Assessment/Plan    PT Assessment Patient needs continued PT services  PT Diagnosis Difficulty walking   PT Problem List Decreased balance;Decreased mobility  PT Treatment Interventions DME instruction;Gait training;Functional mobility training;Therapeutic activities;Therapeutic exercise;Balance training;Patient/family education   PT Goals (Current goals can be found in the Care Plan section) Acute Rehab PT Goals Patient Stated Goal: Return home and to OPPT PT Goal  Formulation: With patient Time For Goal Achievement: 12/21/15 Potential to Achieve Goals: Good    Frequency Min 3X/week   Barriers to discharge        Co-evaluation               End of Session Equipment Utilized During Treatment: Gait belt Activity Tolerance: Patient tolerated treatment well Patient left: in bed;with call bell/phone within reach (sitting EOB) Nurse Communication: Mobility status         Time: 1447-1500 PT  Time Calculation (min) (ACUTE ONLY): 13 min   Charges:   PT Evaluation $PT Eval Low Complexity: 1 Procedure     PT G Codes:        Rithika Seel 2015-12-25, 3:47 PM [facility] PT [phone]

## 2015-12-14 NOTE — Progress Notes (Signed)
[facility] PROGRESS NOTE  VERNA HAMON XVQ:[id number] DOB: [birth year] DOA: 12/11/2015  PCP: Philis Fendt, MD  Brief HPI: 65 year old African-American male presented with shortness of breath, confusion, elevated blood pressure, fever lactic acidosis. He was found to have a urinary tract infection with bacteremia. He had to be intubated for respiratory failure secondary to pulmonary edema. Subsequently was extubated. He was found to have Escherichia coli bacteremia. He was subsequently transferred to the floor.  Past medical history:  Past Medical History  Diagnosis Date  . PUD (peptic ulcer disease)   . CAD (coronary artery disease)     a. STEMI 09/2015 w/ DES to Prox LAD  . CVA (cerebral infarction)     a. 09/2015: acute small right frontal lobe infarct    Consultants: None  Procedures:  Transthoracic echocardiogram Study Conclusions - Left ventricle: The cavity size was mildly dilated. Wall thickness was increased in a pattern of moderate LVH. Systolic function was normal. The estimated ejection fraction was in the range of 15% to 20%. Severe diffuse hypokinesis. Regional wall motion abnormalities cannot be excluded. Doppler parameters are consistent with abnormal left ventricular relaxation (grade 1 diastolic dysfunction). - Aortic valve: Trileaflet; normal thickness, mildly calcified leaflets. - Aortic root: The aortic root was mildly dilated. - Mitral valve: There was mild regurgitation. - Pericardium, extracardiac: A small pericardial effusion was identified.  Antibiotics: Ceftriaxone till 5/23 Vantin 5/23  Subjective: Patient feels well this morning. He denies any shortness of breath. No abdominal pain, nausea or vomiting. Had a headache earlier today, but improved now.  Objective:  Vital Signs  Filed Vitals:   12/14/15 0250 12/14/15 0400 12/14/15 0807 12/14/15 0950  BP: 127/79  155/96   Pulse: 86  81   Temp: 97.9 F (36.6 C)  98.3 F (36.8 C)     TempSrc: Oral  Oral   Resp: 20  20   Height:      Weight:  76.25 kg (168 lb 1.6 oz)    SpO2: 100%  100% 100%    Intake/Output Summary (Last 24 hours) at 12/14/15 1418 Last data filed at 12/14/15 1000  Gross per 24 hour  Intake    240 ml  Output    850 ml  Net   -610 ml   Filed Weights   12/12/15 0133 12/13/15 0500 12/14/15 0400  Weight: 73.4 kg (161 lb 13.1 oz) 74.7 kg (164 lb 10.9 oz) 76.25 kg (168 lb 1.6 oz)    General appearance: alert, cooperative, appears stated age and no distress Resp: Good air entry bilaterally. No wheezing, rales or rhonchi. Cardio: regular rate and rhythm, S1, S2 normal, no murmur, click, rub or gallop GI: soft, non-tender; bowel sounds normal; no masses,  no organomegaly Extremities: extremities normal, atraumatic, no cyanosis or edema Neurologic: He is awake and alert. Oriented 3. No focal neurological deficits noted.  Lab Results:  Data Reviewed: I have personally reviewed following labs and imaging studies  CBC:  Recent Labs Lab 12/11/15 0500 12/11/15 0506 12/12/15 0341 12/13/15 0228 12/14/15 0244  WBC 6.7  --  12.0* 9.8 7.9  NEUTROABS 4.8  --   --   --   --   HGB 10.9* 14.6 8.2* 9.4* 8.9*  HCT 36.8* 43.0 26.4* 31.3* 29.4*  MCV 83.1  --  79.0 80.5 81.2  PLT 311  --  268 289 093   Basic Metabolic Panel:  Recent Labs Lab 12/08/15 0947 12/11/15 0500 12/11/15 0506 12/12/15 0341 12/13/15 0228 12/14/15 0244  NA 136 138 139 140 139 136  K 4.9 4.1 4.1 3.4* 3.5 3.4*  CL 99 99* 100* 107 107 106  CO2 30 27  --  25 24 24  GLUCOSE 324* 480* 467* 77 184* 192*  BUN 15 11 13 12 12 16  CREATININE 1.38* 1.49* 1.20 1.31* 1.35* 1.11  CALCIUM 8.8 8.5*  --  8.0* 8.0* 8.1*  PHOS  --   --   --   --   --  2.7   GFR: Estimated Creatinine Clearance: 65 mL/min (by C-G formula based on Cr of 1.11).  Liver Function Tests:  Recent Labs Lab 12/11/15 0500 12/14/15 0244  AST 22  --   ALT 15*  --   ALKPHOS 155*  --   BILITOT 0.4  --   PROT  7.6  --   ALBUMIN 2.8* 2.4*   Coagulation Profile:  Recent Labs Lab 12/08/15 0924 12/11/15 0500 12/12/15 0341 12/13/15 0228 12/14/15 0244  INR 1.8 1.68* 1.74* 1.82* 1.94*   Cardiac Enzymes:  Recent Labs Lab 12/11/15 0500 12/11/15 0720 12/11/15 1114 12/11/15 1839  TROPONINI 0.04* 0.06* 0.07* 0.07*   CBG:  Recent Labs Lab 12/13/15 1112 12/13/15 1514 12/13/15 2210 12/14/15 0605 12/14/15 1125  GLUCAP 249* 251* 240* 192* 251*   Urine analysis:    Component Value Date/Time   COLORURINE YELLOW 12/11/2015 0344   APPEARANCEUR CLOUDY* 12/11/2015 0344   LABSPEC 1.025 12/11/2015 0344   PHURINE 5.5 12/11/2015 0344   GLUCOSEU >1000* 12/11/2015 0344   HGBUR SMALL* 12/11/2015 0344   BILIRUBINUR NEGATIVE 12/11/2015 0344   KETONESUR NEGATIVE 12/11/2015 0344   PROTEINUR 100* 12/11/2015 0344   UROBILINOGEN 2.0* 02/03/2008 1152   NITRITE NEGATIVE 12/11/2015 0344   LEUKOCYTESUR TRACE* 12/11/2015 0344    Recent Results (from the past 240 hour(s))  Urine culture     Status: Abnormal   Collection Time: 12/11/15  3:44 AM  Result Value Ref Range Status   Specimen Description URINE, CATHETERIZED  Final   Special Requests NONE  Final   Culture >=100,000 COLONIES/mL ESCHERICHIA COLI (A)  Final   Report Status 12/13/2015 FINAL  Final   Organism ID, Bacteria ESCHERICHIA COLI (A)  Final      Susceptibility   Escherichia coli - MIC*    AMPICILLIN <=2 SENSITIVE Sensitive     CEFAZOLIN <=4 SENSITIVE Sensitive     CEFTRIAXONE <=1 SENSITIVE Sensitive     CIPROFLOXACIN <=0.25 SENSITIVE Sensitive     GENTAMICIN <=1 SENSITIVE Sensitive     IMIPENEM <=0.25 SENSITIVE Sensitive     NITROFURANTOIN <=16 SENSITIVE Sensitive     TRIMETH/SULFA <=20 SENSITIVE Sensitive     AMPICILLIN/SULBACTAM <=2 SENSITIVE Sensitive     PIP/TAZO <=4 SENSITIVE Sensitive     * >=100,000 COLONIES/mL ESCHERICHIA COLI  Blood Culture (routine x 2)     Status: None (Preliminary result)   Collection Time: 12/11/15   5:10 AM  Result Value Ref Range Status   Specimen Description BLOOD RIGHT HAND  Final   Special Requests BOTTLES DRAWN AEROBIC AND ANAEROBIC 5ML  Final   Culture  Setup Time   Final    ANAEROBIC BOTTLE ONLY GRAM NEGATIVE RODS CRITICAL RESULT CALLED TO, READ BACK BY AND VERIFIED WITH: TO MHARPER(RN) BY TCLEVELAND 12/11/2015 AT 10:15AM    Culture   Final    GRAM NEGATIVE RODS IDENTIFICATION AND SUSCEPTIBILITIES TO FOLLOW    Report Status PENDING  Incomplete  Blood Culture (routine x 2)     Status: Abnormal (  Preliminary result)   Collection Time: 12/11/15  5:25 AM  Result Value Ref Range Status   Specimen Description BLOOD LEFT ARM  Final   Special Requests BOTTLES DRAWN AEROBIC AND ANAEROBIC 5ML  Final   Culture  Setup Time   Final    GRAM NEGATIVE RODS IN BOTH AEROBIC AND ANAEROBIC BOTTLES Organism ID to follow CRITICAL RESULT CALLED TO, READ BACK BY AND VERIFIED WITH: MHARPER(RN) BY TCLEVELAND 12/11/15 AT 10:35PM    Culture ESCHERICHIA COLI (A)  Final   Report Status PENDING  Incomplete   Organism ID, Bacteria ESCHERICHIA COLI  Final      Susceptibility   Escherichia coli - MIC*    AMPICILLIN <=2 SENSITIVE Sensitive     CEFAZOLIN <=4 SENSITIVE Sensitive     CEFEPIME <=1 SENSITIVE Sensitive     CEFTAZIDIME <=1 SENSITIVE Sensitive     CEFTRIAXONE <=1 SENSITIVE Sensitive     CIPROFLOXACIN <=0.25 SENSITIVE Sensitive     GENTAMICIN <=1 SENSITIVE Sensitive     IMIPENEM <=0.25 SENSITIVE Sensitive     TRIMETH/SULFA <=20 SENSITIVE Sensitive     AMPICILLIN/SULBACTAM <=2 SENSITIVE Sensitive     PIP/TAZO <=4 SENSITIVE Sensitive     * ESCHERICHIA COLI  Blood Culture ID Panel (Reflexed)     Status: Abnormal   Collection Time: 12/11/15  5:25 AM  Result Value Ref Range Status   Enterococcus species NOT DETECTED NOT DETECTED Final   Vancomycin resistance NOT DETECTED NOT DETECTED Final   Listeria monocytogenes NOT DETECTED NOT DETECTED Final   Staphylococcus species NOT DETECTED NOT  DETECTED Final   Staphylococcus aureus NOT DETECTED NOT DETECTED Final   Methicillin resistance NOT DETECTED NOT DETECTED Final   Streptococcus species NOT DETECTED NOT DETECTED Final   Streptococcus agalactiae NOT DETECTED NOT DETECTED Final   Streptococcus pneumoniae NOT DETECTED NOT DETECTED Final   Streptococcus pyogenes NOT DETECTED NOT DETECTED Final   Acinetobacter baumannii NOT DETECTED NOT DETECTED Final   Enterobacteriaceae species NOT DETECTED NOT DETECTED Final   Enterobacter cloacae complex NOT DETECTED NOT DETECTED Corrected    Comment: CORRECTED RESULTS CALLED TO: J MARKLE,PHARM D AT 0852 12/12/15 BY L BENFIELD CORRECTED ON 05/21 AT 0851: PREVIOUSLY REPORTED AS DETECTED CRITICAL RESULT CALLED TO, READ BACK BY AND VERIFIED WITH: TO MHARPER(RN) BY TCLEVELAND 12/11/2015 AT 10:35PM    Escherichia coli DETECTED (A) NOT DETECTED Final    Comment: CRITICAL RESULT CALLED TO, READ BACK BY AND VERIFIED WITH: TO MHARPER(RN) BY TCLEVELAND 12/11/2015 PM AT 10:35PM    Klebsiella oxytoca NOT DETECTED NOT DETECTED Final   Klebsiella pneumoniae NOT DETECTED NOT DETECTED Final   Proteus species NOT DETECTED NOT DETECTED Final   Serratia marcescens NOT DETECTED NOT DETECTED Final   Carbapenem resistance NOT DETECTED NOT DETECTED Final   Haemophilus influenzae NOT DETECTED NOT DETECTED Final   Neisseria meningitidis NOT DETECTED NOT DETECTED Final   Pseudomonas aeruginosa NOT DETECTED NOT DETECTED Final   Candida albicans NOT DETECTED NOT DETECTED Final   Candida glabrata NOT DETECTED NOT DETECTED Final   Candida krusei NOT DETECTED NOT DETECTED Final   Candida parapsilosis NOT DETECTED NOT DETECTED Final   Candida tropicalis NOT DETECTED NOT DETECTED Final  Culture, respiratory (NON-Expectorated)     Status: None   Collection Time: 12/11/15  7:47 AM  Result Value Ref Range Status   Specimen Description SPUTUM  Final   Special Requests Normal  Final   Gram Stain   Final    NO WBC  SEEN NO   SQUAMOUS EPITHELIAL CELLS SEEN NO ORGANISMS SEEN Performed at [facility]    Culture   Final    NORMAL OROPHARYNGEAL FLORA Performed at [facility]    Report Status 12/14/2015 FINAL  Final  MRSA PCR Screening     Status: None   Collection Time: 12/11/15  8:26 AM  Result Value Ref Range Status   MRSA by PCR NEGATIVE NEGATIVE Final    Comment:        The GeneXpert MRSA Assay (FDA approved for NASAL specimens only), is one component of a comprehensive MRSA colonization surveillance program. It is not intended to diagnose MRSA infection nor to guide or monitor treatment for MRSA infections.       Radiology Studies: No results found.   Medications:  Scheduled: . aspirin  81 mg Oral Daily  . atorvastatin  80 mg Oral q1800  . carvedilol  6.25 mg Oral BID WC  . cefpodoxime  200 mg Oral Q12H  . chlorhexidine gluconate (SAGE KIT)  15 mL Mouth Rinse BID  . clopidogrel  75 mg Oral Daily  . insulin aspart  0-15 Units Subcutaneous TID WC  . insulin aspart  0-5 Units Subcutaneous QHS  . lisinopril  5 mg Oral Daily  . pantoprazole  40 mg Oral QHS  . senna-docusate  1 tablet Oral BID  . spironolactone  12.5 mg Oral Daily  . Warfarin - Pharmacist Dosing Inpatient   Does not apply q1800   Continuous: . sodium chloride 10 mL/hr at 12/11/15 1900  . heparin 1,200 Units/hr (12/14/15 0521)   HLK:[id number], ipratropium-albuterol  Assessment/Plan:  Active Problems:   Essential hypertension   Type 2 diabetes mellitus with complication ([place])   Long term (current) use of anticoagulants [Z79.01]   Respiratory failure (HCC)   UTI (urinary tract infection)   Bacteremia, escherichia coli    Acute hypoxic respiratory failure This was secondary to pulmonary edema. Patient is now back to baseline. Continue to wean oxygen. X-ray from 5/21, shows no pulmonary edema. He does have known systolic CHF.  Escherichia coli UTI and Escherichia coli  bacteremia Bacteremia is likely from urinary source. Sensitivities noted. Patient is on ceftriaxone. He is improving. He will be changed over to oral Vantin.  Acute on chronic systolic CHF Patient presented with pulmonary edema. Now well compensated. He is on spironolactone. He was on ACE inhibitor, which is also being continued. Strict ins and outs. Daily weights.  History of coronary artery disease with recent STEMI and cardiac arrest in March 2017 Patient had placement of drug-eluting stent back in March for a STEMI. He is on dual antiplatelet agents. He is on Coreg and statin. Also on ACE inhibitor and Aldactone. Continue all his medications. Outpatient follow-up with cardiology.  Acute on chronic kidney disease stage II Renal function improved. Continue to monitor urine output.  Essential hypertension Blood pressure is noted to be somewhat elevated at times. Continue to monitor for now. Continue current medications.  Anemia of chronic disease and critical illness. Hemoglobin is stable. No evidence for overt bleeding.   Recent history of embolic stroke in March 8768 Patient went to rehabilitation after he sustained a stroke back in March. Seen by neurology at that time. Patient was started on warfarin in April, as was the plan. He is on IV heparin currently. Warfarin has been resumed. INR is close to being therapeutic.  Type 2 diabetes Monitor his CBGs closely. Patient is on sliding scale insulin coverage. He was on metformin and Lantus  at home. HbA1c 9.6.  Acute encephalopathy Toxic metabolic. Now resolved.   DVT Prophylaxis: On IV heparin and warfarin    Code Status: Full code  Family Communication: Discussed with patient  Disposition Plan: Mobilize. PT and OT evaluation. Change to oral antibiotics today. Anticipate discharge 1-2 days.    LOS: 3 days   KRISHNAN,GOKUL  [facility] Pager [phone] 12/14/2015, 2:18 PM  If 7PM-7AM, please contact night-coverage at  [email or web address], password [email or web address]    

## 2015-12-14 NOTE — Progress Notes (Signed)
ANTICOAGULATION CONSULT NOTE  Pharmacy Consult for heparin, warfarin Indication: stroke  No Known Allergies  Patient Measurements: Height: 5\' 8"  (172.7 cm) Weight: 168 lb 1.6 oz (76.25 kg) IBW/kg (Calculated) : 68.4 Heparin Dosing Weight: 72.6 kg  Vital Signs: Temp: 98.3 F (36.8 C) (05/23 0807) Temp Source: Oral (05/23 0807) BP: 155/96 mmHg (05/23 0807) Pulse Rate: 81 (05/23 0807)  Labs:  Recent Labs  12/11/15 1839  12/12/15 0341 12/13/15 0228 12/14/15 0244 12/14/15 1311  HGB  --   < > 8.2* 9.4* 8.9*  --   HCT  --   --  26.4* 31.3* 29.4*  --   PLT  --   --  268 289 254  --   LABPROT  --   --  20.3* 21.0* 22.0*  --   INR  --   --  1.74* 1.82* 1.94*  --   HEPARINUNFRC  --   < > 0.48 0.37 0.23* 0.36  CREATININE  --   --  1.31* 1.35* 1.11  --   TROPONINI 0.07*  --   --   --   --   --   < > = values in this interval not displayed.  Estimated Creatinine Clearance: 65 mL/min (by C-G formula based on Cr of 1.11).  Assessment: 65 y.o. male admitted on 12/11/2015 with pneumonia.   Anticoagulation: Pt on coumadin PTA for h/o recent CVA. Last [facility] clinic visit note 5/17 PTA dose: 7.5 mg qMon/Fri, 5 mg all other days. On 1200 units heparin, HL therapeutic, INR 1.9. CBC stable.  SCr 1.3 > 1.1.   Goal of Therapy:  INR 2-3 Heparin level 0.3 - 0.5 units/ml Monitor platelets by anticoagulation protocol: Yes    Plan:  Continue heparin at 1200 units/hr (aim 0.3-0.5) Warfarin 7.5 mg x 1 tonight Daily heparin level, INR, CBC    Agapito Games, PharmD, BCPS Clinical Pharmacist 12/14/2015 2:19 PM

## 2015-12-14 NOTE — Progress Notes (Signed)
ANTICOAGULATION CONSULT NOTE  Pharmacy Consult for heparin, warfarin Indication: stroke  No Known Allergies  Patient Measurements: Height: 5\' 8"  (172.7 cm) Weight: 168 lb 1.6 oz (76.25 kg) IBW/kg (Calculated) : 68.4 Heparin Dosing Weight: 72.6 kg  Vital Signs: Temp: 97.7 F (36.5 C) (05/23 1500) Temp Source: Oral (05/23 1500) BP: 176/93 mmHg (05/23 1500) Pulse Rate: 104 (05/23 1500)  Labs:  Recent Labs  12/12/15 0341 12/13/15 0228 12/14/15 0244 12/14/15 1311 12/14/15 1838  HGB 8.2* 9.4* 8.9*  --   --   HCT 26.4* 31.3* 29.4*  --   --   PLT 268 289 254  --   --   LABPROT 20.3* 21.0* 22.0*  --   --   INR 1.74* 1.82* 1.94*  --   --   HEPARINUNFRC 0.48 0.37 0.23* 0.36 0.33  CREATININE 1.31* 1.35* 1.11  --   --     Estimated Creatinine Clearance: 65 mL/min (by C-G formula based on Cr of 1.11).  Assessment: 65 y.o. male admitted on 12/11/2015 with pneumonia.   Anticoagulation: Pt on coumadin PTA for h/o recent CVA. Last [facility] clinic visit note 5/17 PTA dose: 7.5 mg qMon/Fri, 5 mg all other days. On 1200 units/hr heparin, HL remains therapeutic    Goal of Therapy:  INR 2-3 Heparin level 0.3 - 0.5 units/ml Monitor platelets by anticoagulation protocol: Yes    Plan:  Continue heparin at 1250 units/hr (aim 0.3-0.5) Daily heparin level, INR, CBC  Vinnie Level, PharmD., BCPS Clinical Pharmacist Pager [phone]

## 2015-12-14 NOTE — Progress Notes (Signed)
ANTICOAGULATION CONSULT NOTE - Follow Up Consult  Pharmacy Consult for heparin Indication: stroke  Labs:  Recent Labs  12/11/15 0500 12/11/15 0506 12/11/15 0720 12/11/15 1114  12/11/15 1839  12/12/15 0341 12/13/15 0228 12/14/15 0244  HGB 10.9* 14.6  --   --   --   --   --  8.2* 9.4* 8.9*  HCT 36.8* 43.0  --   --   --   --   --  26.4* 31.3* 29.4*  PLT 311  --   --   --   --   --   --  268 289 254  APTT 31  --   --   --   --   --   --   --   --   --   LABPROT 19.8*  --   --   --   --   --   --  20.3* 21.0* 22.0*  INR 1.68*  --   --   --   --   --   --  1.74* 1.82* 1.94*  HEPARINUNFRC  --   --   --   --   < >  --   < > 0.48 0.37 0.23*  CREATININE 1.49* 1.20  --   --   --   --   --  1.31* 1.35*  --   TROPONINI 0.04*  --  0.06* 0.07*  --  0.07*  --   --   --   --   < > = values in this interval not displayed.   Assessment: 65yo male now below goal after two levels at goal, no issues w/ gtt per RN.  Goal of Therapy:  Heparin level 0.3-0.5 units/ml   Plan:  Will increase heparin gtt slightly to 1200 units/hr and check level in 8hr.  Vernard Gambles, PharmD, BCPS  12/14/2015,3:34 AM

## 2015-12-15 ENCOUNTER — Encounter: Payer: Self-pay | Admitting: Pharmacist Clinician (PhC)/ Clinical Pharmacy Specialist

## 2015-12-15 LAB — CULTURE, BLOOD (ROUTINE X 2)

## 2015-12-15 LAB — CBC
HEMATOCRIT: 28.9 % — AB (ref 39.0–52.0)
Hemoglobin: 8.8 g/dL — ABNORMAL LOW (ref 13.0–17.0)
MCH: 24.8 pg — ABNORMAL LOW (ref 26.0–34.0)
MCHC: 30.4 g/dL (ref 30.0–36.0)
MCV: 81.4 fL (ref 78.0–100.0)
Platelets: 291 10*3/uL (ref 150–400)
RBC: 3.55 MIL/uL — ABNORMAL LOW (ref 4.22–5.81)
RDW: 15.8 % — AB (ref 11.5–15.5)
WBC: 7.4 10*3/uL (ref 4.0–10.5)

## 2015-12-15 LAB — BASIC METABOLIC PANEL
Anion gap: 7 (ref 5–15)
BUN: 12 mg/dL (ref 6–20)
CHLORIDE: 107 mmol/L (ref 101–111)
CO2: 25 mmol/L (ref 22–32)
CREATININE: 1.05 mg/dL (ref 0.61–1.24)
Calcium: 8.3 mg/dL — ABNORMAL LOW (ref 8.9–10.3)
GFR calc non Af Amer: >60 mL/min (ref >60)
Glucose, Bld: 164 mg/dL — ABNORMAL HIGH (ref 65–99)
POTASSIUM: 3.6 mmol/L (ref 3.5–5.1)
Sodium: 139 mmol/L (ref 135–145)

## 2015-12-15 LAB — GLUCOSE, CAPILLARY
GLUCOSE-CAPILLARY: 207 mg/dL — AB (ref 65–99)
Glucose-Capillary: 167 mg/dL — ABNORMAL HIGH (ref 65–99)

## 2015-12-15 LAB — PROTIME-INR
INR: 1.8 — AB (ref 0.00–1.49)
Prothrombin Time: 20.8 seconds — ABNORMAL HIGH (ref 11.6–15.2)

## 2015-12-15 LAB — HEPARIN LEVEL (UNFRACTIONATED): Heparin Unfractionated: 0.31 IU/mL (ref 0.30–0.70)

## 2015-12-15 MED ORDER — WARFARIN SODIUM 10 MG PO TABS: 10.0000 mg | ORAL_TABLET | Freq: Once | ORAL | Status: DC

## 2015-12-15 MED ORDER — ENOXAPARIN SODIUM 80 MG/0.8ML ~~LOC~~ SOLN
70.0000 mg | Freq: Two times a day (BID) | SUBCUTANEOUS | Status: DC
  Administered 2015-12-15: 70 mg via SUBCUTANEOUS
  Filled 2015-12-15: qty 0.8

## 2015-12-15 MED ORDER — IPRATROPIUM-ALBUTEROL 0.5-2.5 (3) MG/3ML IN SOLN: 3.0000 mL | RESPIRATORY_TRACT | Status: DC | PRN

## 2015-12-15 MED ORDER — CEFPODOXIME PROXETIL 200 MG PO TABS: 200.0000 mg | ORAL_TABLET | Freq: Two times a day (BID) | ORAL | Status: DC

## 2015-12-15 MED ORDER — ENOXAPARIN SODIUM 80 MG/0.8ML ~~LOC~~ SOLN: 70.0000 mg | Freq: Two times a day (BID) | SUBCUTANEOUS | Status: DC

## 2015-12-15 NOTE — Care Management Note (Addendum)
Case Management Note Donn Pierini RN, BSN Unit 2W-Case Manager [phone]  Patient Details  Name: Eddie [name] MRN: [id number] Date of Birth: 1951-07-14  Subjective/Objective:    Pt admitted with resp failure- intubated on admission                Action/Plan: PTA pt lived at home- was doing outpt PT at [facility]- per conversation with pt at bedside- pt wants to continue outpt rehab and has appointment for tomorrow. No further CM needs noted- pt to return home with wife.   Expected Discharge Date:    12/15/15              Expected Discharge Plan:  Home/Self Care  In-House Referral:     Discharge planning Services  CM Consult  Post Acute Care Choice:    Choice offered to:     DME Arranged:    DME Agency:     HH Arranged:    HH Agency:     Status of Service:  Completed, signed off  Medicare Important Message Given:    Date Medicare IM Given:    Medicare IM give by:    Date Additional Medicare IM Given:    Additional Medicare Important Message give by:     If discussed at Long Length of Stay Meetings, dates discussed:    Additional Comments:  Darrold Span, RN 12/15/2015, 1:30 PM

## 2015-12-15 NOTE — Progress Notes (Signed)
Physical Therapy Discharge Patient Details Name: Eddie [name] MRN: [id number] DOB: October 12, 1950 Today's Date: 12/15/2015 Time: 2694-8546 PT Time Calculation (min) (ACUTE ONLY): 13 min  Patient discharged from PT services secondary to goals met and no further acute PT needs identified.  Please see latest therapy progress note for current level of functioning and progress toward goals.    Progress and discharge plan discussed with patient and/or caregiver: Patient/Caregiver agrees with plan  GP     [facility] 12/15/2015, 10:34 AM  [place]

## 2015-12-15 NOTE — Discharge Instructions (Signed)
Please continue taking Lovenox injections today and tomorrow, you must have PT/INR checked in 1-2 days and if INR is stable, >2, you can stop Lovenox and continue with Coumadin only   GO to your doctor's office in AM or the day after to have PT/INR checked    Bacteremia Bacteremia is the presence of bacteria in the blood. A small amount of bacteria may not cause any symptoms. Sometimes, the bacteria spread and cause infection in other parts of the body, such as the heart, joints, bones, or brain. Having a great amount of bacteria can cause a serious, sometimes life-threatening infection called sepsis. CAUSES This condition is caused by bacteria that get into the blood. Bacteria can enter the blood:  During a dental or medical procedure.  After you brush your teeth so hard that the gums bleed.  Through a scrape or cut on your skin. More severe types of bacteremia can be caused by:  A bacterial infection, such as pneumonia, that spreads to the blood.  Using a dirty needle. RISK FACTORS This condition is more likely to develop in:  Children and elderly adults.  People who have a long-lasting (chronic) disease or medical condition.  People who have an artificial joint or heart valve.  People who have heart valve disease.  People who have a tube, such as a catheter or IV tube, that has been inserted for a medical treatment.  People who have a weak body defense system (immune system).  People who use IV drugs. SYMPTOMS Usually, this condition does not cause symptoms when it is mild. When it is more serious, it may cause:  Fever.  Chills.  Racing heart.  Shortness of breath.  Dizziness.  Weakness.  Confusion.  Nausea or vomiting.  Diarrhea. Bacteremia that has spread to other parts of the body may cause symptoms in those areas. DIAGNOSIS This condition may be diagnosed with a physical exam and tests, such as:  A complete blood count (CBC). This test looks for  signs of infection.  Blood cultures. These look for bacteria in your blood.  Tests of any IV tubes. These look for a source of infection.  Urine tests.  Imaging tests, such as an X-ray, CT scan, MRI, or heart ultrasound. TREATMENT If the condition is mild, treatment is usually not needed. Usually, the body's immune system will remove the bacteria. If the condition is more serious, it may be treated with:  Antibiotic medicines through an IV tube. These may be given for about 2 weeks. At first, the antibiotic that is given may kill most types of blood bacteria. If your test results show that a certain kind of bacteria is causing problems, the antibiotic may be changed to kill only the bacteria that are causing problems.  Antibiotics taken by mouth.  Removing any catheter or IV tube that is a source of infection.  Blood pressure and breathing support, if needed.  Surgery to control the source or spread of infection, if needed. HOME CARE INSTRUCTIONS  Take over-the-counter and prescription medicines only as told by your health care provider.  If you were prescribed an antibiotic, take it as told by your health care provider. Do not stop taking the antibiotic even if you start to feel better.  Rest at home until your condition is under control.  Drink enough fluid to keep your urine clear or pale yellow.  Keep all follow-up visits as told by your health care provider. This is important. PREVENTION Take these actions to  help prevent future episodes of bacteremia:  Get all vaccinations as recommended by your health care provider.  Clean and cover scrapes or cuts.  Bathe regularly.  Wash your hands often.  Before any dental or surgical procedure, ask your health care provider if you should take an antibiotic. SEEK MEDICAL CARE IF:  Your symptoms get worse.  You continue to have symptoms after treatment.  You develop new symptoms after treatment. SEEK IMMEDIATE MEDICAL CARE  IF:  You have chest pain or trouble breathing.  You develop confusion, dizziness, or weakness.  You develop pale skin.   This information is not intended to replace advice given to you by your health care provider. Make sure you discuss any questions you have with your health care provider.   Document Released: 04/23/2006 Document Revised: 03/31/2015 Document Reviewed: 09/12/2014 Elsevier Interactive Patient Education [employer]

## 2015-12-15 NOTE — Progress Notes (Signed)
Patient and wife were educated on how to inject lovenox. I demonstrated to both of them and they stated that they understood. IV was dc'd and was intact. Discharge instructions were educated on and they stated that they understood.

## 2015-12-15 NOTE — Progress Notes (Signed)
Physical Therapy Treatment Patient Details Name: Eddie [name] MRN: [id number] DOB: 03/04/1951 Today's Date: 12/15/2015    History of Present Illness Pt adm with acute respiratory failure due to pulmonary edema and hypertensive urgency. Pt intubated 5/20-5/21. PMH - recent rt CVA, NSTEMI, HTN, CHF    PT Comments    Pt doing well with mobility and no further PT needed.  Ready for dc from PT standpoint.    Follow Up Recommendations  Outpatient PT (resume)     Equipment Recommendations  None recommended by PT    Recommendations for Other Services       Precautions / Restrictions Precautions Precautions: Fall    Mobility  Bed Mobility Overal bed mobility: Modified Independent                Transfers Overall transfer level: Modified independent                  Ambulation/Gait Ambulation/Gait assistance: Modified independent (Device/Increase time) Ambulation Distance (Feet): 550 Feet Assistive device: Straight cane Gait Pattern/deviations: Step-through pattern   Gait velocity interpretation: at or above normal speed for age/gender General Gait Details: Steady gait without any loss of balance even with obstacles.   Stairs            Wheelchair Mobility    Modified Rankin (Stroke Patients Only)       Balance   Sitting-balance support: No upper extremity supported Sitting balance-Leahy Scale: Normal     Standing balance support: No upper extremity supported;During functional activity Standing balance-Leahy Scale: Good                      Cognition Arousal/Alertness: Awake/alert Behavior During Therapy: WFL for tasks assessed/performed Overall Cognitive Status: Within Functional Limits for tasks assessed                      Exercises      General Comments        Pertinent Vitals/Pain Pain Assessment: No/denies pain    Home Living                      Prior Function            PT Goals  (current goals can now be found in the care plan section) Acute Rehab PT Goals Patient Stated Goal: Return home and to OPPT Progress towards PT goals: Goals met/education completed, patient discharged from PT    Frequency       PT Plan      Co-evaluation             End of Session Equipment Utilized During Treatment: Gait belt Activity Tolerance: Patient tolerated treatment well Patient left: in bed;with call bell/phone within reach (sitting EOB)     Time: 1010-1023 PT Time Calculation (min) (ACUTE ONLY): 13 min  Charges:  $Gait Training: 8-22 mins                    G Codes:      MAYCOCK,CARY 12/15/2015, 10:34 AM Cary Maycock PT [phone]   

## 2015-12-15 NOTE — Discharge Summary (Signed)
Physician Discharge Summary  JAKAREE PICKARD CHE:[id number] DOB: 1950-09-10 DOA: 12/11/2015  PCP: Philis Fendt, MD  Admit date: 12/11/2015 Discharge date: 12/15/2015  Recommendations for Outpatient Follow-up:  1. Pt will need to follow up with PCP in 1-2 weeks post discharge 2. Please obtain BMP to evaluate electrolytes and kidney function 3. Please also check CBC to evaluate Hg and Hct levels 4. Pt discharged on Vantin to complete therapy for bacteremia 5. Blood cultures ordered to ensure resolution and results pending on discharge  6. Pt declined my offer to update family, wants to update himself, my phone number provided for any questions  7. Pt was given script to Lovenox to take until INR therapeutic, can stop once INR at target range   Discharge Diagnoses:  Active Problems:   Essential hypertension   Type 2 diabetes mellitus with complication (HCC)   Long term (current) use of anticoagulants [Z79.01]   Respiratory failure (HCC)   UTI (urinary tract infection)   Bacteremia, escherichia coli  Discharge Condition: Stable  Diet recommendation: Heart healthy diet discussed in details   History of present illness:  65 year old African-American male presented with shortness of breath, confusion, elevated blood pressure, fever lactic acidosis. He was found to have a urinary tract infection with bacteremia. He had to be intubated for respiratory failure secondary to pulmonary edema. Subsequently was extubated. He was found to have Escherichia coli bacteremia. He was subsequently transferred to the floor.  Hospital Course:  Acute hypoxic respiratory failure secondary to acute on chronic systolic CHF Resolved, continue ACEI, spironolactone, BB Weight on discharge 73 kg  Escherichia coli UTI and Escherichia coli bacteremia Bacteremia is likely from urinary source. Sensitivities noted. Changed to oral Vantin on discharge.   History of coronary artery disease with recent STEMI and  cardiac arrest in March 2017 Patient had placement of drug-eluting stent back in March for a STEMI.  He is on Coreg and statin. Also on ACE inhibitor and Aldactone. Continue coumadin  Outpatient follow-up with cardiology.  Acute on chronic kidney disease stage II Pre renal in etiology, Cr now back to target range.  Essential hypertension Continue home medical regimen  Anemia of chronic disease and critical illness. Hemoglobin is stable. No evidence for overt bleeding.   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 Seen by neurology at that time.  Patient was started on warfarin in April, as was the plan.  Type 2 diabetes with complications of CKD stage II  Continue home medical regimen   Acute encephalopathy Toxic metabolic. Now resolved.  Procedures/Studies: Ct Head Wo Contrast  12/11/2015  CLINICAL DATA:  Decreased mental status. History of stroke in March. Concern for sepsis due to bladder infection. EXAM: CT HEAD WITHOUT CONTRAST TECHNIQUE: Contiguous axial images were obtained from the base of the skull through the vertex without intravenous contrast. COMPARISON:  None. FINDINGS: Brain: Geographic area of decreased attenuation involving the right centrum semiovale (is 21, series 201), the sequela of microvascular ischemic disease. Old infarct within the right basal ganglia (image 15, series 201). The gray-white differentiation is otherwise well maintained without CT evidence of acute large territory infarct. No intraparenchymal or extra-axial mass or hemorrhage. Normal size and configuration of the ventricles and basilar cisterns. No midline shift. Vascular: Minimal intracranial atherosclerosis Skull: Negative for fracture or focal lesion. Sinuses/Orbits: Post right-sided cataract surgery. There is underpneumatization the bilateral frontal sinuses. The remaining paranasal sinuses and mastoid air  cells are  normally aerated. No air-fluid levels. Other: The patient is intubated. Regional soft tissues appear normal. 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 Electronically Signed   By: Simonne Come M.D.   On: 12/11/2015 08:27   Dg Chest Port 1 View  12/12/2015  CLINICAL DATA:  Ventilator dependent respiratory failure. STEMI and stroke in March, 2017. Patient admitted yesterday for acute on chronic shortness of breath and fever. EXAM: PORTABLE CHEST 1 VIEW COMPARISON:  12/11/2015. FINDINGS: Endotracheal tube tip in satisfactory position projecting approximately 5 cm above the carina. Nasogastric tube courses below the diaphragm into the stomach. Cardiac silhouette upper normal in size to slightly enlarged for AP portable technique. Lungs clear. Bronchovascular markings normal. Pulmonary vascularity normal. No visible pleural effusions. No pneumothorax. Barium from the swallowing function study performed 5 days ago still present in the visualized splenic flexure the colon. IMPRESSION: 1. Support apparatus satisfactory. 2.  No acute cardiopulmonary disease. Electronically Signed   By: Hulan Saas M.D.   On: 12/12/2015 10:19   Dg Chest Port 1 View  12/11/2015  CLINICAL DATA:  Respiratory failure. History of coronary artery disease. EXAM: PORTABLE CHEST 1 VIEW COMPARISON:  12/11/2015 at 5:25 a.m. FINDINGS: Endotracheal tube is well positioned measuring 3 cm above the carina. Nasal/orogastric tube is also well positioned passing below the diaphragm into the stomach. Cardiac silhouette is borderline enlarged. No mediastinal or hilar masses or evidence of adenopathy. There is bilateral interstitial thickening with central hazy airspace opacity. Findings suggest pulmonary edema. No convincing pleural effusion. No pneumothorax on this semi-erect study. IMPRESSION: 1. Findings similar to the earlier study. 2. Interstitial thickening with mild hazy central airspace lung opacity is most  likely due to pulmonary edema/congestive heart failure. 3. Endotracheal and nasogastric tubes are stable and well positioned. Electronically Signed   By: Amie Portland M.D.   On: 12/11/2015 08:11   Dg Chest Portable 1 View  12/11/2015  CLINICAL DATA:  Endotracheal tube placement EXAM: PORTABLE CHEST 1 VIEW COMPARISON:  None. FINDINGS: Endotracheal tube present with tip measuring 3.7 cm above the carina. Enteric tube is present with tip off the field of view but below the left hemidiaphragm. Proximal side hole is positioned just below the expected location of the EG junction. Heart size is normal. There are bilateral perihilar infiltrates suggesting edema or pneumonia. No blunting of costophrenic angles. No pneumothorax. IMPRESSION: Appliances appear in satisfactory location. Bilateral perihilar infiltrates suggesting edema or pneumonia. Electronically Signed   By: Burman Nieves M.D.   On: 12/11/2015 06:16   Dg Swallowing Func-speech Pathology  12/07/2015  Objective Swallowing Evaluation: Type of Study: MBS-Modified Barium Swallow Study Patient Details Name: HARVARD ZEISS MRN: [id number] Date of Birth: 14-Oct-1950 Today's Date: 12/07/2015 Time: SLP Start Time (ACUTE ONLY): 1033-SLP Stop Time (ACUTE ONLY): 1049 SLP Time Calculation (min) (ACUTE ONLY): 16 min Past Medical History: Past Medical History Diagnosis Date . PUD (peptic ulcer disease)  . CAD (coronary artery disease)    a. STEMI 09/2015 w/ DES to Prox LAD . CVA (cerebral infarction)    a. 09/2015: acute small right frontal lobe infarct Past Surgical History: Past Surgical History Procedure Laterality Date . Repair of peptic ulcer   . Cardiac catheterization N/A 09/28/2015   Procedure: Left Heart Cath and Coronary Angiography;  Surgeon: Runell Gess, MD;  Location: [facility] INVASIVE CV LAB;  Service: Cardiovascular;  Laterality: N/A; . Cardiac catheterization N/A 10/08/2015   Procedure: Left Heart Cath and Coronary Angiography;  Surgeon: Sherilyn Cooter  Nicholes Stairs, MD;   Location: [facility] CV LAB;  Service: Cardiovascular;  Laterality: N/A; HPI: 65 year old male referred for outpatient MBS to determine appropriateness for advanced diet. Pt was admitted 09/28/15 with chest pain, cardiac arrest (brief chest compressions),  NSTEMI, 13 day intubation. CVA per MRI 3/14 with acute small right frontal infarct, old left caudate and cerebellar infarcts. No Data Recorded Assessment / Plan / Recommendation CHL IP CLINICAL IMPRESSIONS 10/21/2015 Therapy Diagnosis Moderate pharyngeal phase dysphagia Clinical Impression Pt presents with mild pharyngeal dysphagia, characterized primarily by delayed swallow reflex, with trigger at the pyriform on liquids, at the vallecula on puree and solid.  This increases risk for airway compromise. Pt tolerated multiple presentations of thin via cup sip with only intermittent flash penetration which cleared completely and was not aspirated. Silent aspiration of thin via straw was noted. No post-swallow residue was noted following any consistency, however, pt continues to demonstrate independent dry swallow on each bolus. Barium tablet was noted to clear the hypopharynx without delay, however, there was what appeared to be osteophytes at C4-C7, which may be causing globus sensation with meds. Esophageal sweep revealed the distal esophagus slow to clear, with what appeared to be tertiary contractions. GI consult may be beneficial to evaluate esophageal dysmotility. Recommend advancing diet to regular solids and thin liquids via cup (no straw). Safe swallow strategies and behavioral and dietary suggestions for management of esophageal dysmotility were reviewed and provided in written form. Pt was encouraged to follow up with Outpatient SLP.   Impact on safety and function Mild aspiration risk   CHL IP TREATMENT RECOMMENDATION 10/15/2015 Treatment Recommendations Therapy as outlined in treatment plan below   Prognosis 12/07/2015 Prognosis for Safe Diet Advancement  Good CHL IP DIET RECOMMENDATION 10/22/2015 Compensations Minimize environmental distractions;Slow rate;Small sips/bites;Multiple dry swallows after each bite/sip Postural Changes Upright during meals and 30 minutes after po intake to facilitate esophageal clearing   CHL IP OTHER RECOMMENDATIONS 10/21/2015 Recommended Consults GI consult for esophageal workup Oral Care Recommendations Oral care BID Other Recommendations Return to OP SLP    CHL IP FOLLOW UP RECOMMENDATIONS 10/15/2015 Follow up Recommendations Inpatient Rehab   CHL IP FREQUENCY AND DURATION 10/15/2015 Speech Therapy Frequency (ACUTE ONLY) min 3x week Treatment Duration 2 weeks   CHL IP ORAL PHASE 12/07/2015 Oral Phase WFL  CHL IP PHARYNGEAL PHASE 12/07/2015 Pharyngeal Phase Impaired  CHL IP CERVICAL ESOPHAGEAL PHASE 10/21/2015 Cervical Esophageal Phase WFL CHL IP GO 12/07/2015 Functional Assessment Tool Used asha noms, clinical judgment, MBS Functional Limitations Swallowing Swallow Current Status (V4098) CI Swallow Goal Status (J1914) CI Swallow Discharge Status (N8295) CI Shonna Chock 12/07/2015, 12:57 PM  Celia B. Quentin [facility], CCC-SLP [phone]              Discharge Exam: Filed Vitals:   12/15/15 0514 12/15/15 0652  BP: 151/93 150/90  Pulse: 82 81  Temp: 98.4 F (36.9 C)   Resp: 18    Filed Vitals:   12/14/15 2057 12/15/15 0500 12/15/15 0514 12/15/15 0652  BP: 189/98  151/93 150/90  Pulse: 80  82 81  Temp: 98.3 F (36.8 C)  98.4 F (36.9 C)   TempSrc: Oral  Oral   Resp: 20  18   Height:      Weight:  73.891 kg (162 lb 14.4 oz)    SpO2: 100%  100%     General: Pt is alert, follows commands appropriately, not in acute distress Cardiovascular: Regular rate and rhythm, no rubs, no  gallops Respiratory: Clear to auscultation bilaterally, no wheezing, no crackles, no rhonchi Abdominal: Soft, non tender, non distended, bowel sounds +, no guarding   Discharge Instructions  Discharge Instructions    Diet - low sodium heart  healthy    Complete by:  As directed      Increase activity slowly    Complete by:  As directed             Medication List    STOP taking these medications        Insulin Glargine 100 UNIT/ML Solostar Pen  Commonly known as:  LANTUS     Insulin Pen Needle 31G X 5 MM Misc  Commonly known as:  EASY TOUCH PEN NEEDLES     MUSCLE RUB 10-15 % Crea     RESOURCE THICKENUP CLEAR Powd     senna 8.6 MG Tabs tablet  Commonly known as:  SENOKOT      TAKE these medications        aspirin 81 MG chewable tablet  Chew 1 tablet (81 mg total) by mouth daily.     atorvastatin 80 MG tablet  Commonly known as:  LIPITOR  Take 1 tablet (80 mg total) by mouth daily at 6 PM.     blood glucose meter kit and supplies Kit  Dispense based on patient and insurance preference. Use up to four times daily as directed. (FOR ICD-9 250.00, 250.01).     carvedilol 6.25 MG tablet  Commonly known as:  COREG  Take 1 tablet (6.25 mg total) by mouth 2 (two) times daily with a meal.     cefpodoxime 200 MG tablet  Commonly known as:  VANTIN  Take 1 tablet (200 mg total) by mouth every 12 (twelve) hours.     clopidogrel 75 MG tablet  Commonly known as:  PLAVIX  Take 4 tablets today then take 1 tablet by mouth daily after that     enoxaparin 80 MG/0.8ML injection  Commonly known as:  LOVENOX  Inject 0.7 mLs (70 mg total) into the skin 2 (two) times daily.     ipratropium-albuterol 0.5-2.5 (3) MG/3ML Soln  Commonly known as:  DUONEB  Take 3 mLs by nebulization every 2 (two) hours as needed.     lisinopril 5 MG tablet  Commonly known as:  PRINIVIL,ZESTRIL  Take 5 mg by mouth daily.     metFORMIN 850 MG tablet  Commonly known as:  GLUCOPHAGE  Take 1 tablet (850 mg total) by mouth daily with breakfast.     nitroGLYCERIN 0.4 MG SL tablet  Commonly known as:  NITROSTAT  Use one pill every 5 minutes X 3 if needed for chest pain. Contact MD if pain does not improve     spironolactone 25 MG tablet   Commonly known as:  ALDACTONE  Take 0.5 tablets (12.5 mg total) by mouth daily.     warfarin 5 MG tablet  Commonly known as:  COUMADIN  Take 1 tablet daily or as directed by coumadin clinic           Follow-up Information    Follow up with Philis Fendt, MD.   Specialty:  Internal Medicine   Contact information:   [place] [address] [phone]       Call Faye Ramsay, MD.   Specialty:  Internal Medicine   Why:  As needed call my cell phone [phone]   Contact information:   [place]  [phone]        The results of significant diagnostics from this hospitalization (including imaging, microbiology, ancillary and laboratory) are listed below for reference.     Microbiology: Recent Results (from the past 240 hour(s))  Urine culture     Status: Abnormal   Collection Time: 12/11/15  3:44 AM  Result Value Ref Range Status   Specimen Description URINE, CATHETERIZED  Final   Special Requests NONE  Final   Culture >=100,000 COLONIES/mL ESCHERICHIA COLI (A)  Final   Report Status 12/13/2015 FINAL  Final   Organism ID, Bacteria ESCHERICHIA COLI (A)  Final      Susceptibility   Escherichia coli - MIC*    AMPICILLIN <=2 SENSITIVE Sensitive     CEFAZOLIN <=4 SENSITIVE Sensitive     CEFTRIAXONE <=1 SENSITIVE Sensitive     CIPROFLOXACIN <=0.25 SENSITIVE Sensitive     GENTAMICIN <=1 SENSITIVE Sensitive     IMIPENEM <=0.25 SENSITIVE Sensitive     NITROFURANTOIN <=16 SENSITIVE Sensitive     TRIMETH/SULFA <=20 SENSITIVE Sensitive     AMPICILLIN/SULBACTAM <=2 SENSITIVE Sensitive     PIP/TAZO <=4 SENSITIVE Sensitive     * >=100,000 COLONIES/mL ESCHERICHIA COLI  Blood Culture (routine x 2)     Status: Abnormal   Collection Time: 12/11/15  5:10 AM  Result Value Ref Range Status   Specimen Description BLOOD RIGHT HAND  Final   Special Requests BOTTLES DRAWN AEROBIC AND ANAEROBIC 5ML  Final    Culture  Setup Time   Final    ANAEROBIC BOTTLE ONLY GRAM NEGATIVE RODS CRITICAL RESULT CALLED TO, READ BACK BY AND VERIFIED WITH: TO MHARPER(RN) BY TCLEVELAND 12/11/2015 AT 10:15AM    Culture (A)  Final    ESCHERICHIA COLI SUSCEPTIBILITIES PERFORMED ON PREVIOUS CULTURE WITHIN THE LAST 5 DAYS.    Report Status 12/15/2015 FINAL  Final  Blood Culture (routine x 2)     Status: Abnormal   Collection Time: 12/11/15  5:25 AM  Result Value Ref Range Status   Specimen Description BLOOD LEFT ARM  Final   Special Requests BOTTLES DRAWN AEROBIC AND ANAEROBIC 5ML  Final   Culture  Setup Time   Final    GRAM NEGATIVE RODS IN BOTH AEROBIC AND ANAEROBIC BOTTLES CRITICAL RESULT CALLED TO, READ BACK BY AND VERIFIED WITH: MHARPER(RN) BY [facility] 12/11/15 AT 10:35PM    Culture ESCHERICHIA COLI (A)  Final   Report Status 12/15/2015 FINAL  Final   Organism ID, Bacteria ESCHERICHIA COLI  Final      Susceptibility   Escherichia coli - MIC*    AMPICILLIN <=2 SENSITIVE Sensitive     CEFAZOLIN <=4 SENSITIVE Sensitive     CEFEPIME <=1 SENSITIVE Sensitive     CEFTAZIDIME <=1 SENSITIVE Sensitive     CEFTRIAXONE <=1 SENSITIVE Sensitive     CIPROFLOXACIN <=0.25 SENSITIVE Sensitive     GENTAMICIN <=1 SENSITIVE Sensitive     IMIPENEM <=0.25 SENSITIVE Sensitive     TRIMETH/SULFA <=20 SENSITIVE Sensitive     AMPICILLIN/SULBACTAM <=2 SENSITIVE Sensitive     PIP/TAZO <=4 SENSITIVE Sensitive     * ESCHERICHIA COLI  Blood Culture ID Panel (Reflexed)     Status: Abnormal   Collection Time: 12/11/15  5:25 AM  Result Value Ref Range Status   Enterococcus species NOT DETECTED NOT DETECTED Final   Vancomycin resistance NOT DETECTED NOT DETECTED Final   Listeria monocytogenes NOT DETECTED NOT DETECTED Final   Staphylococcus species NOT DETECTED NOT DETECTED Final  Staphylococcus aureus NOT DETECTED NOT DETECTED Final   Methicillin resistance NOT DETECTED NOT DETECTED Final   Streptococcus species NOT DETECTED NOT  DETECTED Final   Streptococcus agalactiae NOT DETECTED NOT DETECTED Final   Streptococcus pneumoniae NOT DETECTED NOT DETECTED Final   Streptococcus pyogenes NOT DETECTED NOT DETECTED Final   Acinetobacter baumannii NOT DETECTED NOT DETECTED Final   Enterobacteriaceae species NOT DETECTED NOT DETECTED Final   Enterobacter cloacae complex NOT DETECTED NOT DETECTED Corrected    Comment: CORRECTED RESULTS CALLED [place] [place] AT [phone] 12/12/15 BY L BENFIELD CORRECTED ON 05/21 AT 0851: PREVIOUSLY REPORTED AS DETECTED CRITICAL RESULT CALLED TO, READ BACK BY AND VERIFIED WITH: TO MHARPER(RN) BY TCLEVELAND 12/11/2015 AT 10:35PM    Escherichia coli DETECTED (A) NOT DETECTED Final    Comment: CRITICAL RESULT CALLED TO, READ BACK BY AND VERIFIED WITH: TO MHARPER(RN) BY TCLEVELAND 12/11/2015 PM AT 10:35PM    Klebsiella oxytoca NOT DETECTED NOT DETECTED Final   Klebsiella pneumoniae NOT DETECTED NOT DETECTED Final   Proteus species NOT DETECTED NOT DETECTED Final   Serratia marcescens NOT DETECTED NOT DETECTED Final   Carbapenem resistance NOT DETECTED NOT DETECTED Final   Haemophilus influenzae NOT DETECTED NOT DETECTED Final   Neisseria meningitidis NOT DETECTED NOT DETECTED Final   Pseudomonas aeruginosa NOT DETECTED NOT DETECTED Final   Candida albicans NOT DETECTED NOT DETECTED Final   Candida glabrata NOT DETECTED NOT DETECTED Final   Candida krusei NOT DETECTED NOT DETECTED Final   Candida parapsilosis NOT DETECTED NOT DETECTED Final   Candida tropicalis NOT DETECTED NOT DETECTED Final  Culture, respiratory (NON-Expectorated)     Status: None   Collection Time: 12/11/15  7:47 AM  Result Value Ref Range Status   Specimen Description SPUTUM  Final   Special Requests Normal  Final   Gram Stain   Final    NO WBC SEEN NO SQUAMOUS EPITHELIAL CELLS SEEN NO ORGANISMS SEEN Performed at [facility]    Culture   Final    NORMAL OROPHARYNGEAL FLORA Performed at [facility]    Report Status 12/14/2015 FINAL  Final  MRSA PCR Screening     Status: None   Collection Time: 12/11/15  8:26 AM  Result Value Ref Range Status   MRSA by PCR NEGATIVE NEGATIVE Final    Comment:        The GeneXpert MRSA Assay (FDA approved for NASAL specimens only), is one component of a comprehensive MRSA colonization surveillance program. It is not intended to diagnose MRSA infection nor to guide or monitor treatment for MRSA infections.      Labs: Basic Metabolic Panel:  Recent Labs Lab 12/11/15 0500 12/11/15 0506 12/12/15 0341 12/13/15 0228 12/14/15 0244 12/15/15 0330  NA 138 139 140 139 136 139  K 4.1 4.1 3.4* 3.5 3.4* 3.6  CL 99* 100* 107 107 106 107  CO2 27  --  '25 24 24 25  '$ GLUCOSE 480* 467* 77 184* 192* 164*  BUN '11 13 12 12 16 12  '$ CREATININE 1.49* 1.20 1.31* 1.35* 1.11 1.05  CALCIUM 8.5*  --  8.0* 8.0* 8.1* 8.3*  PHOS  --   --   --   --  2.7  --    Liver Function Tests:  Recent Labs Lab 12/11/15 0500 12/14/15 0244  AST 22  --   ALT 15*  --   ALKPHOS 155*  --   BILITOT 0.4  --   PROT 7.6  --  ALBUMIN 2.8* 2.4*   CBC:  Recent Labs Lab 12/11/15 0500 12/11/15 0506 12/12/15 0341 12/13/15 0228 12/14/15 0244 12/15/15 0330  WBC 6.7  --  12.0* 9.8 7.9 7.4  NEUTROABS 4.8  --   --   --   --   --   HGB 10.9* 14.6 8.2* 9.4* 8.9* 8.8*  HCT 36.8* 43.0 26.4* 31.3* 29.4* 28.9*  MCV 83.1  --  79.0 80.5 81.2 81.4  PLT 311  --  268 289 254 291   Cardiac Enzymes:  Recent Labs Lab 12/11/15 0500 12/11/15 0720 12/11/15 1114 12/11/15 1839  TROPONINI 0.04* 0.06* 0.07* 0.07*   BNP: BNP (last 3 results)  Recent Labs  12/11/15 0720  BNP 492.7*    CBG:  Recent Labs Lab 12/14/15 0605 12/14/15 1125 12/14/15 1631 12/14/15 2056 12/15/15 0642  GLUCAP 192* 251* 186* 194* 167*   SIGNED: Time coordinating discharge: 30 minutes  MAGICK-MYERS, ISKRA, MD  [facility] 12/15/2015, 11:26 AM Pager [phone]  If 7PM-7AM, please  contact night-coverage [email or web address] Password [email or web address]

## 2015-12-15 NOTE — Progress Notes (Addendum)
ANTICOAGULATION CONSULT NOTE  Pharmacy Consult for heparin, warfarin Indication: stroke  No Known Allergies  Patient Measurements: Height: 5\' 8"  (172.7 cm) Weight: 162 lb 14.4 oz (73.891 kg) IBW/kg (Calculated) : 68.4 Heparin Dosing Weight: 72.6 kg  Vital Signs: Temp: 98.4 F (36.9 C) (05/24 0514) Temp Source: Oral (05/24 0514) BP: 150/90 mmHg (05/24 0652) Pulse Rate: 81 (05/24 0652)  Labs:  Recent Labs  12/13/15 0228 12/14/15 0244 12/14/15 1311 12/14/15 1838 12/15/15 0330  HGB 9.4* 8.9*  --   --  8.8*  HCT 31.3* 29.4*  --   --  28.9*  PLT 289 254  --   --  291  LABPROT 21.0* 22.0*  --   --  20.8*  INR 1.82* 1.94*  --   --  1.80*  HEPARINUNFRC 0.37 0.23* 0.36 0.33 0.31  CREATININE 1.35* 1.11  --   --  1.05    Estimated Creatinine Clearance: 68.8 mL/min (by C-G formula based on Cr of 1.05).  Assessment: 65 y.o. male admitted on 12/11/2015 with pneumonia.   Anticoagulation: Pt on coumadin PTA for h/o recent CVA. Last [facility] clinic visit note 5/17 PTA dose: 7.5 mg qMon/Fri, 5 mg all other days. On 1200 units heparin, HL therapeutic, INR 1.8. CBC stable.  SCr 1.3 > 1.   Goal of Therapy:  INR 2-3 Heparin level 0.3 - 0.5 units/ml Monitor platelets by anticoagulation protocol: Yes    Plan:  Continue heparin at 1200 units/hr (aim 0.3-0.5) Warfarin 10 mg x 1 tonight Daily heparin level, INR, CBC    Agapito Games, PharmD, BCPS Clinical Pharmacist 12/15/2015 8:40 AM    Addendum -Switching to Lovenox -Stop heparin, one hour later administer Lovenox -Lovenox 70 mg [place] BID -Warfarin 10 mg po x1   Baldemar Friday  12/15/2015 10:24 AM

## 2015-12-16 ENCOUNTER — Ambulatory Visit: Payer: BLUE CROSS/BLUE SHIELD

## 2015-12-16 DIAGNOSIS — R2689 Other abnormalities of gait and mobility: Secondary | ICD-10-CM | POA: Diagnosis present

## 2015-12-16 DIAGNOSIS — R498 Other voice and resonance disorders: Secondary | ICD-10-CM | POA: Diagnosis present

## 2015-12-16 DIAGNOSIS — R41841 Cognitive communication deficit: Secondary | ICD-10-CM | POA: Diagnosis present

## 2015-12-16 DIAGNOSIS — R1313 Dysphagia, pharyngeal phase: Secondary | ICD-10-CM | POA: Diagnosis not present

## 2015-12-16 NOTE — Therapy (Signed)
[facility] [address] [place], [place], [address] Phone: [phone]   Fax:  [phone]  Physical Therapy Treatment  Patient Details  Name: Eddie [name] MRN: [id number] Date of Birth: Oct 24, 1950 Referring Provider: Dr. Wynn [name]  Encounter Date: 12/16/2015      PT End of Session - 12/16/15 0911    Visit Number 3  NO CHARGE   Number of Visits 9   Date for PT Re-Evaluation 12/02/15   Authorization Type [id number] visit combo limit   PT Start Time 0845   PT Stop Time 0851   PT Time Calculation (min) 6 min   Activity Tolerance Treatment limited secondary to medical complications (Comment)   Behavior During Therapy [facility] for tasks assessed/performed      Past Medical History  Diagnosis Date  . PUD (peptic ulcer disease)   . CAD (coronary artery disease)     a. STEMI 09/2015 w/ DES to Prox LAD  . CVA (cerebral infarction)     a. 09/2015: acute small right frontal lobe infarct    Past Surgical History  Procedure Laterality Date  . Repair of peptic ulcer    . Cardiac catheterization N/A 09/28/2015    Procedure: Left Heart Cath and Coronary Angiography;  Surgeon: Runell Gess, MD;  Location: [facility] INVASIVE CV LAB;  Service: Cardiovascular;  Laterality: N/A;  . Cardiac catheterization N/A 10/08/2015    Procedure: Left Heart Cath and Coronary Angiography;  Surgeon: Lyn Records, MD;  Location: [facility] INVASIVE CV LAB;  Service: Cardiovascular;  Laterality: N/A;    There were no vitals filed for this visit.      Subjective Assessment - 12/16/15 0910    Subjective Pt reported he was admitted to the hospital this week with SOB and elevated BP. Pt was discharged yesterday, pt requested that PT assess his BP as he had not taken his BP meds this morning.   Patient is accompained by: Family member   Pertinent History MI, HTN, DM, CAD, L eye blindness   Patient Stated Goals Walk without cane, wash car without losing balance   Currently in Pain? No/denies                                   PT Short Term Goals - 11/02/15 1300    PT SHORT TERM GOAL #1   Title same as LTGs           PT Long Term Goals - 11/19/15 1253    PT LONG TERM GOAL #1   Title Pt will be IND in HEP to improve balance and strength. Target date: 11/30/15   Status On-going   PT LONG TERM GOAL #2   Title Pt will improve FGA score to >/=25/30 to decr. falls risk. Target date: 11/30/15   Status On-going   PT LONG TERM GOAL #3   Title Pt will amb. 300' IND without AD, over uneven terrain (sand, grass), in order to improve functional mobility and to go to the beach this summer/fall. Target date: 11/30/15   Status On-going   PT LONG TERM GOAL #4   Title Pt will amb. 1000' over paved surfaces (even) IND, no AD, in order to walk dog and improve functional mobility. . Target date: 11/30/15   Status On-going   PT LONG TERM GOAL #5   Title Pt will verbalize walking program and potentially how to progress to light jogging  based on progress, in order to walk/jog with dog. Target date: 11/30/15   Status On-going               Plan - 12/16/15 0912    Clinical Impression Statement PT did not charge today, as pt was admitted to hospital this week (d/c yesterday, 12/15/15) and did not have a note to resume OPPT and his BP was elevated at rest (176/118) and HR elevated (98bpm). PT asked pt to notify MD of elevated vitals and to request resume therapy order. PT also notified speech therapist of pt's elevated vitals.   Rehab Potential Good   Clinical Impairments Affecting Rehab Potential co-morbidities   PT Frequency 2x / week   PT Duration 4 weeks   PT Treatment/Interventions ADLs/Self Care Home Management;Biofeedback;Canalith Repostioning;Therapeutic activities;Therapeutic exercise;Balance training;Manual techniques;Vestibular;Orthotic Fit/Training;Functional mobility training;Stair training;Gait training;DME  Instruction;Patient/family education;Cognitive remediation   PT Next Visit Plan Check BP, Check goals and renew if appropriate (once we receive resume OPPT order)   PT Home Exercise Plan Strengthening, balance, and flexibility HEP   Consulted and Agree with Plan of Care Patient   Family Member Consulted pt's wife: Eddie [name]      Patient will benefit from skilled therapeutic intervention in order to improve the following deficits and impairments:  Abnormal gait, Decreased endurance, Decreased knowledge of use of DME, Decreased strength, Impaired flexibility, Decreased cognition, Decreased balance, Decreased mobility  Visit Diagnosis: Other abnormalities of gait and mobility     Problem List Patient Active Problem List   Diagnosis Date Noted  . UTI (urinary tract infection) 12/14/2015  . Bacteremia, escherichia coli 12/14/2015  . Respiratory failure (HCC) 12/11/2015  . Acute respiratory failure with hypercapnia (HCC)   . Demand ischemia (HCC)   . Gait disturbance, post-stroke 12/09/2015  . Cognitive deficit, post-stroke 12/09/2015  . Numbness and tingling of both legs below knees 12/09/2015  . Long term (current) use of anticoagulants [Z79.01] 11/01/2015  . Ischemic stroke of frontal lobe (HCC) 10/15/2015  . Anoxic encephalopathy (HCC) 10/15/2015  . Right-sided cerebrovascular accident (CVA) (HCC)   . Acute on chronic systolic congestive heart failure (HCC)   . HCAP (healthcare-associated pneumonia)   . Dysphagia as late effect of cerebrovascular disease   . Tobacco abuse   . Tachypnea   . Essential hypertension   . Type 2 diabetes mellitus with complication (HCC)   . Leukocytosis   . Absolute anemia   . Altered mental status   . Hemiplegia (HCC)   . Stroke (HCC)   . ARDS (adult respiratory distress syndrome) (HCC)   . Acute respiratory failure (HCC)   . STEMI (ST elevation myocardial infarction) (HCC) 09/28/2015  . Cardiac arrest (HCC)   . ST elevation myocardial infarction  (STEMI) (HCC)   . Encounter for central line placement   . Hypokalemia   . Encephalopathy acute   . Acute hypoxemic respiratory failure (HCC)     Eddie [name] L 12/16/2015, 9:15 AM   [facility] [address] [place], [place], [address] Phone: [phone]   Fax:  [phone]  Name: Eddie [name] MRN: [id number] Date of Birth: 20-Sep-1950    Zerita Boers, PT,DPT 12/16/2015 9:15 AM Phone: [phone] Fax: [phone]

## 2015-12-16 NOTE — Therapy (Signed)
[facility] [address] [place], [place], [address] Phone: [phone]   Fax:  [phone]  Speech Language Pathology Treatment  Patient Details  Name: Eddie [name] MRN: [id number] Date of Birth: [birth year] Referring Provider: Claudette Laws, MD  Encounter Date: 12/16/2015      End of Session - 12/16/15 0846    Visit Number 7   Number of Visits 16   Authorization - Visit Number 7   Authorization - Number of Visits 30   SLP Start Time 0810   SLP Stop Time  0846   SLP Time Calculation (min) 36 min   Activity Tolerance Patient tolerated treatment well      Past Medical History  Diagnosis Date  . PUD (peptic ulcer disease)   . CAD (coronary artery disease)     a. STEMI 09/2015 w/ DES to Prox LAD  . CVA (cerebral infarction)     a. 09/2015: acute small right frontal lobe infarct    Past Surgical History  Procedure Laterality Date  . Repair of peptic ulcer    . Cardiac catheterization N/A 09/28/2015    Procedure: Left Heart Cath and Coronary Angiography;  Surgeon: Runell Gess, MD;  Location: [facility] INVASIVE CV LAB;  Service: Cardiovascular;  Laterality: N/A;  . Cardiac catheterization N/A 10/08/2015    Procedure: Left Heart Cath and Coronary Angiography;  Surgeon: Lyn Records, MD;  Location: [facility] INVASIVE CV LAB;  Service: Cardiovascular;  Laterality: N/A;    There were no vitals filed for this visit.      Subjective Assessment - 12/16/15 0813    Subjective Pt excited about passing his modified.    Patient is accompained by: Family member  pearl, wife   Currently in Pain? No/denies               ADULT SLP TREATMENT - 12/16/15 0827    General Information   Behavior/Cognition Alert;Cooperative;Pleasant mood   Treatment Provided   Treatment provided Dysphagia   Dysphagia Treatment   Temperature Spikes Noted No   Treatment Methods Skilled observation;Compensation strategy training   Other  treatment/comments Educated pt/wife on suggestion of tart liquids given diagnosis of delayed swallow trigger. Pt req'd questioning cues occasionally for swallow precautions.   Cognitive-Linquistic Treatment   Treatment focused on Cognition   Skilled Treatment Pt recalled 0/4 strategies. SLP reviewed with him and told him SLP would be asking about them next time. Reminded pt/wife about pt carrying small spiral notebook and pencil/pen. Played "memory" game with pt to introduce him to the concept - wife watching.    Assessment / Recommendations / Plan   Plan Continue with current plan of care   Dysphagia Recommendations   Diet recommendations Regular;Thin liquid   Supervision Intermittent supervision to cue for compensatory strategies   Compensations Slow rate;Small sips/bites;Multiple dry swallows after each bite/sip   Postural Changes and/or Swallow Maneuvers Upright 30-60 min after meal   Progression Toward Goals   Progression toward goals Progressing toward goals          SLP Education - 12/16/15 0846    Education provided Yes   Education Details memory game; swallow precautions, tart liquids, spiral notebook for memory compensation   Person(s) Educated Patient;Spouse   Methods Explanation;Demonstration;Verbal cues   Comprehension Verbalized understanding;Returned demonstration;Need further instruction          SLP Short Term Goals - 12/16/15 0830    SLP SHORT TERM GOAL #1   Title pt will perform  HEP for swallowing with occasional min A   Time 1   Period Weeks   Status Achieved   SLP SHORT TERM GOAL #2   Title pt will demo swallow precuations with POS with rare min A   Time 1   Period Weeks   Status Achieved   SLP SHORT TERM GOAL #3   Title pt will state 3/4  memory compensations over 3 sessions   Time 1   Period Weeks   Status On-going          SLP Long Term Goals - 12/16/15 0841    SLP LONG TERM GOAL #1   Title pt will complete HEP for swallowing with rare min A    Time 5   Period Weeks   Status On-going   SLP LONG TERM GOAL #2   Title pt will tell SLP how using memory strategies could benefit him   Status Achieved   SLP LONG TERM GOAL #3   Title pt will demo swallowing precautions with POs with modified independence   Time 5   Period Weeks   Status On-going          Plan - 12/16/15 0847    Clinical Impression Statement Pt admitted and intubated over the weekend from complications surrounding a UTI. Voice quality not unlike first contact with this SLP. Pt re'd questioning cues to recall swallowing precautions .SLP stressed carrying a notebook for improvement with memory. MBSS results were regular/thin with precautions, no straws. and was able to provide compensations for memory today in ST session. He would benfit from skilled ST to cont  for addressing pt's ability to monitor precautions as well as to cont to monitor memory skills and provide assistance with this PRN.   Speech Therapy Frequency 2x / week   Duration --  5 weeks   Treatment/Interventions Aspiration precaution training;Pharyngeal strengthening exercises;Diet toleration management by SLP;Cognitive reorganization;SLP instruction and feedback;Compensatory strategies;Internal/external aids;Patient/family education;Functional tasks;Cueing hierarchy   Potential to Achieve Goals Fair   Potential Considerations Severity of impairments      Patient will benefit from skilled therapeutic intervention in order to improve the following deficits and impairments:   Dysphagia, pharyngeal phase  Cognitive communication deficit  Other voice and resonance disorders    Problem List Patient Active Problem List   Diagnosis Date Noted  . UTI (urinary tract infection) 12/14/2015  . Bacteremia, escherichia coli 12/14/2015  . Respiratory failure (HCC) 12/11/2015  . Acute respiratory failure with hypercapnia (HCC)   . Demand ischemia (HCC)   . Gait disturbance, post-stroke 12/09/2015  .  Cognitive deficit, post-stroke 12/09/2015  . Numbness and tingling of both legs below knees 12/09/2015  . Long term (current) use of anticoagulants [Z79.01] 11/01/2015  . Ischemic stroke of frontal lobe (HCC) 10/15/2015  . Anoxic encephalopathy (HCC) 10/15/2015  . Right-sided cerebrovascular accident (CVA) (HCC)   . Acute on chronic systolic congestive heart failure (HCC)   . HCAP (healthcare-associated pneumonia)   . Dysphagia as late effect of cerebrovascular disease   . Tobacco abuse   . Tachypnea   . Essential hypertension   . Type 2 diabetes mellitus with complication (HCC)   . Leukocytosis   . Absolute anemia   . Altered mental status   . Hemiplegia (HCC)   . Stroke (HCC)   . ARDS (adult respiratory distress syndrome) (HCC)   . Acute respiratory failure (HCC)   . STEMI (ST elevation myocardial infarction) (HCC) 09/28/2015  . Cardiac arrest (HCC)   . ST  elevation myocardial infarction (STEMI) (HCC)   . Encounter for central line placement   . Hypokalemia   . Encephalopathy acute   . Acute hypoxemic respiratory failure (HCC)     Cosby Proby ,MS, CCC-SLP  12/16/2015, 9:03 AM  [facility] [address] [place], [place], [address] Phone: [phone]   Fax:  [phone]   Name: Eddie [name] MRN: [id number] Date of Birth: 1950-09-20

## 2015-12-16 NOTE — Patient Instructions (Signed)
  Memory Strategies  W - Write it down  A - Associate it with something  R - Repeat it  M - Mental Image     Play the memory game  Try to remember 3-5 items on your store list without looking  Study a detailed picture in a magazine for 1 minute, then write down everything you can remember from the picture      

## 2015-12-20 ENCOUNTER — Inpatient Hospital Stay (HOSPITAL_COMMUNITY)
Admission: EM | Admit: 2015-12-20 | Discharge: 2015-12-24 | DRG: 291 | Disposition: A | Discharge status: Home / Self Care | Payer: BLUE CROSS/BLUE SHIELD | Attending: Internal Medicine | Admitting: Internal Medicine

## 2015-12-20 ENCOUNTER — Encounter (HOSPITAL_COMMUNITY): Payer: Self-pay | Admitting: Emergency Medicine

## 2015-12-20 ENCOUNTER — Emergency Department (HOSPITAL_COMMUNITY): Payer: BLUE CROSS/BLUE SHIELD

## 2015-12-20 DIAGNOSIS — E876 Hypokalemia: Secondary | ICD-10-CM | POA: Diagnosis present

## 2015-12-20 DIAGNOSIS — I252 Old myocardial infarction: Secondary | ICD-10-CM

## 2015-12-20 DIAGNOSIS — J9602 Acute respiratory failure with hypercapnia: Secondary | ICD-10-CM | POA: Diagnosis not present

## 2015-12-20 DIAGNOSIS — Z8673 Personal history of transient ischemic attack (TIA), and cerebral infarction without residual deficits: Secondary | ICD-10-CM | POA: Diagnosis not present

## 2015-12-20 DIAGNOSIS — Z7902 Long term (current) use of antithrombotics/antiplatelets: Secondary | ICD-10-CM

## 2015-12-20 DIAGNOSIS — E872 Acidosis: Secondary | ICD-10-CM | POA: Diagnosis present

## 2015-12-20 DIAGNOSIS — I5040 Unspecified combined systolic (congestive) and diastolic (congestive) heart failure: Secondary | ICD-10-CM | POA: Diagnosis not present

## 2015-12-20 DIAGNOSIS — I5043 Acute on chronic combined systolic (congestive) and diastolic (congestive) heart failure: Secondary | ICD-10-CM | POA: Diagnosis present

## 2015-12-20 DIAGNOSIS — I161 Hypertensive emergency: Secondary | ICD-10-CM | POA: Diagnosis present

## 2015-12-20 DIAGNOSIS — J811 Chronic pulmonary edema: Secondary | ICD-10-CM | POA: Insufficient documentation

## 2015-12-20 DIAGNOSIS — Z7982 Long term (current) use of aspirin: Secondary | ICD-10-CM

## 2015-12-20 DIAGNOSIS — I130 Hypertensive heart and chronic kidney disease with heart failure and stage 1 through stage 4 chronic kidney disease, or unspecified chronic kidney disease: Secondary | ICD-10-CM | POA: Diagnosis present

## 2015-12-20 DIAGNOSIS — N182 Chronic kidney disease, stage 2 (mild): Secondary | ICD-10-CM | POA: Diagnosis present

## 2015-12-20 DIAGNOSIS — J9690 Respiratory failure, unspecified, unspecified whether with hypoxia or hypercapnia: Secondary | ICD-10-CM | POA: Diagnosis present

## 2015-12-20 DIAGNOSIS — I2510 Atherosclerotic heart disease of native coronary artery without angina pectoris: Secondary | ICD-10-CM | POA: Diagnosis present

## 2015-12-20 DIAGNOSIS — F17200 Nicotine dependence, unspecified, uncomplicated: Secondary | ICD-10-CM | POA: Diagnosis present

## 2015-12-20 DIAGNOSIS — J9601 Acute respiratory failure with hypoxia: Secondary | ICD-10-CM

## 2015-12-20 DIAGNOSIS — D638 Anemia in other chronic diseases classified elsewhere: Secondary | ICD-10-CM | POA: Diagnosis present

## 2015-12-20 DIAGNOSIS — Z87898 Personal history of other specified conditions: Secondary | ICD-10-CM | POA: Diagnosis not present

## 2015-12-20 DIAGNOSIS — I674 Hypertensive encephalopathy: Secondary | ICD-10-CM | POA: Diagnosis present

## 2015-12-20 DIAGNOSIS — N390 Urinary tract infection, site not specified: Secondary | ICD-10-CM

## 2015-12-20 DIAGNOSIS — E785 Hyperlipidemia, unspecified: Secondary | ICD-10-CM | POA: Diagnosis present

## 2015-12-20 DIAGNOSIS — E1122 Type 2 diabetes mellitus with diabetic chronic kidney disease: Secondary | ICD-10-CM | POA: Diagnosis present

## 2015-12-20 DIAGNOSIS — Z7901 Long term (current) use of anticoagulants: Secondary | ICD-10-CM | POA: Diagnosis not present

## 2015-12-20 DIAGNOSIS — Z7984 Long term (current) use of oral hypoglycemic drugs: Secondary | ICD-10-CM | POA: Diagnosis not present

## 2015-12-20 DIAGNOSIS — Z955 Presence of coronary angioplasty implant and graft: Secondary | ICD-10-CM | POA: Diagnosis not present

## 2015-12-20 DIAGNOSIS — I132 Hypertensive heart and chronic kidney disease with heart failure and with stage 5 chronic kidney disease, or end stage renal disease: Secondary | ICD-10-CM | POA: Diagnosis not present

## 2015-12-20 DIAGNOSIS — J810 Acute pulmonary edema: Secondary | ICD-10-CM | POA: Diagnosis not present

## 2015-12-20 DIAGNOSIS — Z79899 Other long term (current) drug therapy: Secondary | ICD-10-CM | POA: Diagnosis not present

## 2015-12-20 DIAGNOSIS — E118 Type 2 diabetes mellitus with unspecified complications: Secondary | ICD-10-CM

## 2015-12-20 DIAGNOSIS — N185 Chronic kidney disease, stage 5: Secondary | ICD-10-CM

## 2015-12-20 DIAGNOSIS — Z4659 Encounter for fitting and adjustment of other gastrointestinal appliance and device: Secondary | ICD-10-CM

## 2015-12-20 LAB — COMPREHENSIVE METABOLIC PANEL
ALBUMIN: 3.1 g/dL — AB (ref 3.5–5.0)
ALT: 23 U/L (ref 17–63)
ANION GAP: 10 (ref 5–15)
AST: 39 U/L (ref 15–41)
Alkaline Phosphatase: 112 U/L (ref 38–126)
BILIRUBIN TOTAL: 0.5 mg/dL (ref 0.3–1.2)
BUN: 7 mg/dL (ref 6–20)
CO2: 24 mmol/L (ref 22–32)
Calcium: 8.5 mg/dL — ABNORMAL LOW (ref 8.9–10.3)
Chloride: 105 mmol/L (ref 101–111)
Creatinine, Ser: 1.35 mg/dL — ABNORMAL HIGH (ref 0.61–1.24)
GFR calc Af Amer: >60 mL/min (ref >60)
GFR calc non Af Amer: 54 mL/min — ABNORMAL LOW (ref >60)
GLUCOSE: 393 mg/dL — AB (ref 65–99)
POTASSIUM: 3.8 mmol/L (ref 3.5–5.1)
Sodium: 139 mmol/L (ref 135–145)
TOTAL PROTEIN: 7.6 g/dL (ref 6.5–8.1)

## 2015-12-20 LAB — CBC WITH DIFFERENTIAL/PLATELET
BASOS ABS: 0 10*3/uL (ref 0.0–0.1)
Basophils Relative: 0 %
Eosinophils Absolute: 0.2 10*3/uL (ref 0.0–0.7)
Eosinophils Relative: 2 %
HEMATOCRIT: 33.8 % — AB (ref 39.0–52.0)
Hemoglobin: 9.7 g/dL — ABNORMAL LOW (ref 13.0–17.0)
LYMPHS ABS: 4.6 10*3/uL — AB (ref 0.7–4.0)
LYMPHS PCT: 43 %
MCH: 24.7 pg — AB (ref 26.0–34.0)
MCHC: 28.7 g/dL — ABNORMAL LOW (ref 30.0–36.0)
MCV: 86 fL (ref 78.0–100.0)
MONO ABS: 0.6 10*3/uL (ref 0.1–1.0)
Monocytes Relative: 5 %
NEUTROS ABS: 5.4 10*3/uL (ref 1.7–7.7)
Neutrophils Relative %: 50 %
Platelets: 355 10*3/uL (ref 150–400)
RBC: 3.93 MIL/uL — AB (ref 4.22–5.81)
RDW: 16.6 % — ABNORMAL HIGH (ref 11.5–15.5)
WBC: 10.9 10*3/uL — ABNORMAL HIGH (ref 4.0–10.5)

## 2015-12-20 LAB — URINALYSIS, ROUTINE W REFLEX MICROSCOPIC
Bilirubin Urine: NEGATIVE
Glucose, UA: 250 mg/dL — AB
Ketones, ur: NEGATIVE mg/dL
NITRITE: NEGATIVE
PROTEIN: 100 mg/dL — AB
Specific Gravity, Urine: 1.016 (ref 1.005–1.030)
pH: 5.5 (ref 5.0–8.0)

## 2015-12-20 LAB — URINE MICROSCOPIC-ADD ON

## 2015-12-20 LAB — I-STAT ARTERIAL BLOOD GAS, ED
Acid-Base Excess: 5 mmol/L — ABNORMAL HIGH (ref 0.0–2.0)
BICARBONATE: 28.7 meq/L — AB (ref 20.0–24.0)
O2 Saturation: 98 %
PO2 ART: 101 mmHg — AB (ref 80.0–100.0)
Patient temperature: 98.6
TCO2: 30 mmol/L (ref 0–100)
pCO2 arterial: 39.3 mmHg (ref 35.0–45.0)
pH, Arterial: 7.471 — ABNORMAL HIGH (ref 7.350–7.450)

## 2015-12-20 LAB — I-STAT CG4 LACTIC ACID, ED: Lactic Acid, Venous: 4 mmol/L (ref 0.5–2.0)

## 2015-12-20 LAB — I-STAT TROPONIN, ED: Troponin i, poc: 0.03 ng/mL (ref 0.00–0.08)

## 2015-12-20 LAB — TRIGLYCERIDES: TRIGLYCERIDES: 98 mg/dL (ref <150)

## 2015-12-20 LAB — CULTURE, BLOOD (ROUTINE X 2)
Culture: NO GROWTH
Culture: NO GROWTH

## 2015-12-20 LAB — BRAIN NATRIURETIC PEPTIDE: B Natriuretic Peptide: 1475.7 pg/mL — ABNORMAL HIGH (ref 0.0–100.0)

## 2015-12-20 LAB — PROCALCITONIN: PROCALCITONIN: 0.22 ng/mL

## 2015-12-20 LAB — PROTIME-INR
INR: 2.2 — ABNORMAL HIGH (ref 0.00–1.49)
Prothrombin Time: 24.3 seconds — ABNORMAL HIGH (ref 11.6–15.2)

## 2015-12-20 LAB — TROPONIN I: TROPONIN I: 0.05 ng/mL — AB (ref <0.031)

## 2015-12-20 MED ORDER — CARVEDILOL 12.5 MG PO TABS: 6.2500 mg | ORAL_TABLET | Freq: Two times a day (BID) | ORAL | Status: DC

## 2015-12-20 MED ORDER — HYDRALAZINE HCL 20 MG/ML IJ SOLN: 10.0000 mg | INTRAMUSCULAR | Status: DC | PRN

## 2015-12-20 MED ORDER — FUROSEMIDE 10 MG/ML IJ SOLN
40.0000 mg | Freq: Once | INTRAMUSCULAR | Status: AC
  Administered 2015-12-20: 40 mg via INTRAVENOUS
  Filled 2015-12-20: qty 4

## 2015-12-20 MED ORDER — SODIUM CHLORIDE 0.9 % IV SOLN
INTRAVENOUS | Status: DC
  Administered 2015-12-21 – 2015-12-22 (×2): via INTRAVENOUS

## 2015-12-20 MED ORDER — CEFPODOXIME PROXETIL 200 MG PO TABS: 200.0000 mg | ORAL_TABLET | Freq: Two times a day (BID) | ORAL | Status: DC

## 2015-12-20 MED ORDER — CLOPIDOGREL BISULFATE 75 MG PO TABS
75.0000 mg | ORAL_TABLET | Freq: Every day | ORAL | Status: DC
  Administered 2015-12-21: 75 mg
  Filled 2015-12-20 (×2): qty 1

## 2015-12-20 MED ORDER — CEFPODOXIME PROXETIL 200 MG PO TABS
200.0000 mg | ORAL_TABLET | Freq: Two times a day (BID) | ORAL | Status: AC
  Administered 2015-12-21 (×3): 200 mg
  Filled 2015-12-20 (×3): qty 1

## 2015-12-20 MED ORDER — NITROGLYCERIN IN D5W 200-5 MCG/ML-% IV SOLN
INTRAVENOUS | Status: AC
  Administered 2015-12-20: 5 ug/min
  Filled 2015-12-20: qty 250

## 2015-12-20 MED ORDER — PANTOPRAZOLE SODIUM 40 MG PO PACK
40.0000 mg | PACK | Freq: Every day | ORAL | Status: DC
  Administered 2015-12-21: 40 mg
  Filled 2015-12-20 (×2): qty 20

## 2015-12-20 MED ORDER — PROPOFOL 1000 MG/100ML IV EMUL
5.0000 ug/kg/min | Freq: Once | INTRAVENOUS | Status: AC
  Administered 2015-12-20: 10 ug/kg/min via INTRAVENOUS

## 2015-12-20 MED ORDER — PROPOFOL 1000 MG/100ML IV EMUL
0.0000 ug/kg/min | INTRAVENOUS | Status: DC
  Administered 2015-12-21: 10 ug/kg/min via INTRAVENOUS
  Administered 2015-12-21: 30 ug/kg/min via INTRAVENOUS
  Filled 2015-12-20 (×2): qty 100

## 2015-12-20 MED ORDER — VANCOMYCIN HCL 10 G IV SOLR
1250.0000 mg | Freq: Once | INTRAVENOUS | Status: DC
  Administered 2015-12-20: 1250 mg via INTRAVENOUS
  Filled 2015-12-20: qty 1250

## 2015-12-20 MED ORDER — CHLORHEXIDINE GLUCONATE 0.12% ORAL RINSE (MEDLINE KIT)
15.0000 mL | Freq: Two times a day (BID) | OROMUCOSAL | Status: DC
  Administered 2015-12-21 – 2015-12-22 (×4): 15 mL via OROMUCOSAL

## 2015-12-20 MED ORDER — CARVEDILOL 6.25 MG PO TABS
6.2500 mg | ORAL_TABLET | Freq: Two times a day (BID) | ORAL | Status: DC
  Administered 2015-12-21 – 2015-12-22 (×3): 6.25 mg
  Filled 2015-12-20 (×3): qty 1

## 2015-12-20 MED ORDER — LISINOPRIL 10 MG PO TABS: 5.0000 mg | ORAL_TABLET | Freq: Every day | ORAL | Status: DC

## 2015-12-20 MED ORDER — ATORVASTATIN CALCIUM 80 MG PO TABS: 80.0000 mg | ORAL_TABLET | Freq: Every day | ORAL | Status: DC

## 2015-12-20 MED ORDER — IPRATROPIUM-ALBUTEROL 0.5-2.5 (3) MG/3ML IN SOLN: 3.0000 mL | RESPIRATORY_TRACT | Status: DC | PRN

## 2015-12-20 MED ORDER — INSULIN ASPART 100 UNIT/ML ~~LOC~~ SOLN
0.0000 [IU] | SUBCUTANEOUS | Status: DC
  Administered 2015-12-21 (×2): 2 [IU] via SUBCUTANEOUS
  Administered 2015-12-21: 3 [IU] via SUBCUTANEOUS
  Administered 2015-12-21: 15 [IU] via SUBCUTANEOUS
  Administered 2015-12-21: 3 [IU] via SUBCUTANEOUS
  Administered 2015-12-21: 11 [IU] via SUBCUTANEOUS
  Administered 2015-12-21: 3 [IU] via SUBCUTANEOUS
  Administered 2015-12-22: 5 [IU] via SUBCUTANEOUS
  Administered 2015-12-22: 3 [IU] via SUBCUTANEOUS
  Administered 2015-12-22 (×2): 5 [IU] via SUBCUTANEOUS
  Administered 2015-12-22: 8 [IU] via SUBCUTANEOUS
  Administered 2015-12-23: 11 [IU] via SUBCUTANEOUS
  Administered 2015-12-23: 3 [IU] via SUBCUTANEOUS
  Administered 2015-12-23: 8 [IU] via SUBCUTANEOUS
  Administered 2015-12-23: 11 [IU] via SUBCUTANEOUS
  Administered 2015-12-24 (×2): 3 [IU] via SUBCUTANEOUS

## 2015-12-20 MED ORDER — ASPIRIN 81 MG PO CHEW
81.0000 mg | CHEWABLE_TABLET | Freq: Every day | ORAL | Status: DC
  Administered 2015-12-21: 81 mg via ORAL
  Filled 2015-12-20: qty 1

## 2015-12-20 MED ORDER — ATORVASTATIN CALCIUM 80 MG PO TABS
80.0000 mg | ORAL_TABLET | Freq: Every day | ORAL | Status: DC
  Administered 2015-12-21: 80 mg
  Filled 2015-12-20: qty 1

## 2015-12-20 MED ORDER — SUCCINYLCHOLINE CHLORIDE 20 MG/ML IJ SOLN
INTRAMUSCULAR | Status: DC | PRN
  Administered 2015-12-20: 113 mg via INTRAVENOUS

## 2015-12-20 MED ORDER — SODIUM CHLORIDE 0.9 % IV SOLN
25.0000 ug/h | INTRAVENOUS | Status: DC
  Administered 2015-12-21: 50 ug/h via INTRAVENOUS
  Filled 2015-12-20: qty 50

## 2015-12-20 MED ORDER — PIPERACILLIN-TAZOBACTAM 3.375 G IVPB: 3.3750 g | Freq: Three times a day (TID) | INTRAVENOUS | Status: DC

## 2015-12-20 MED ORDER — FENTANYL BOLUS VIA INFUSION
50.0000 ug | INTRAVENOUS | Status: DC | PRN
  Filled 2015-12-20: qty 50

## 2015-12-20 MED ORDER — PIPERACILLIN-TAZOBACTAM 3.375 G IVPB 30 MIN
3.3750 g | Freq: Once | INTRAVENOUS | Status: DC
  Administered 2015-12-20: 3.375 g via INTRAVENOUS
  Filled 2015-12-20: qty 50

## 2015-12-20 MED ORDER — VANCOMYCIN HCL IN DEXTROSE 750-5 MG/150ML-% IV SOLN: 750.0000 mg | Freq: Two times a day (BID) | INTRAVENOUS | Status: DC

## 2015-12-20 MED ORDER — MIDAZOLAM HCL 2 MG/2ML IJ SOLN: 2.0000 mg | INTRAMUSCULAR | Status: DC | PRN

## 2015-12-20 MED ORDER — ANTISEPTIC ORAL RINSE SOLUTION (CORINZ)
7.0000 mL | Freq: Four times a day (QID) | OROMUCOSAL | Status: DC
  Administered 2015-12-21 – 2015-12-22 (×8): 7 mL via OROMUCOSAL

## 2015-12-20 MED ORDER — PROPOFOL 1000 MG/100ML IV EMUL
INTRAVENOUS | Status: AC
  Filled 2015-12-20: qty 100

## 2015-12-20 MED ORDER — ETOMIDATE 2 MG/ML IV SOLN
INTRAVENOUS | Status: DC | PRN
  Administered 2015-12-20: 20 mg via INTRAVENOUS

## 2015-12-20 MED ORDER — SPIRONOLACTONE 12.5 MG HALF TABLET: 12.5000 mg | ORAL_TABLET | Freq: Every day | ORAL | Status: DC

## 2015-12-20 MED ORDER — SPIRONOLACTONE 25 MG PO TABS
12.5000 mg | ORAL_TABLET | Freq: Every day | ORAL | Status: DC
  Administered 2015-12-21 – 2015-12-22 (×2): 12.5 mg
  Filled 2015-12-20 (×2): qty 0.5

## 2015-12-20 MED ORDER — VANCOMYCIN HCL IN DEXTROSE 1-5 GM/200ML-% IV SOLN: 1000.0000 mg | Freq: Once | INTRAVENOUS | Status: DC

## 2015-12-20 MED ORDER — LISINOPRIL 5 MG PO TABS
5.0000 mg | ORAL_TABLET | Freq: Every day | ORAL | Status: DC
  Administered 2015-12-21: 5 mg
  Filled 2015-12-20 (×2): qty 1

## 2015-12-20 MED ORDER — FENTANYL CITRATE (PF) 100 MCG/2ML IJ SOLN: 50.0000 ug | INTRAMUSCULAR | Status: DC | PRN

## 2015-12-20 MED ORDER — CLOPIDOGREL BISULFATE 75 MG PO TABS: 75.0000 mg | ORAL_TABLET | Freq: Every day | ORAL | Status: DC

## 2015-12-20 NOTE — Progress Notes (Signed)
Pharmacy Antibiotic Note  Eddie [name] is a 65 y.o. male admitted on 12/20/2015 with sepsis and cardiac arrest.  Pharmacy has been consulted for vancomycin and Zosyn dosing.  WBC 10.9, afebrile, SCr 1.35, CrCl 55  Plan: --Vancomycin 1250 mg IV x1, then 750 mg every 12 hours; Goal trough 15-20 mcg/mL. --Zosyn 3.375g IV q8h (4 hour infusion; 1st dose over 30 min). --f/u cultures, LOT, trough at Css  Height: 5\' 9"  (175.3 cm) Weight: 162 lb (73.483 kg) IBW/kg (Calculated) : 70.7  Temp (24hrs), Avg:97.1 F (36.2 C), Min:96.8 F (36 C), Max:97.3 F (36.3 C)   Recent Labs Lab 12/14/15 0244 12/15/15 0330 12/20/15 1924  WBC 7.9 7.4 10.9*  CREATININE 1.11 1.05 1.35*    Estimated Creatinine Clearance: 55.3 mL/min (by C-G formula based on Cr of 1.35).    No Known Allergies  Antimicrobials this admission: 5/29 vancomycin >>  5/29 Zosyn >>   Dose adjustments this admission: n/a  Microbiology results: Pending  Thank you for allowing pharmacy to be a part of this patient's care.  Arcola Jansky, PharmD Clinical Pharmacy Resident Pager: [phone] 12/20/2015 8:44 PM

## 2015-12-20 NOTE — ED Notes (Signed)
Propofol 20mg  bolus given

## 2015-12-20 NOTE — ED Notes (Signed)
Family at bedside. 

## 2015-12-20 NOTE — ED Notes (Signed)
Port cxr complete

## 2015-12-20 NOTE — ED Notes (Signed)
EMS reports on there arrival pt was unable to speak due to shortness of breath.  Pt was placed on C-Cap and then went unresponsive.  Pt then was being bagged.  On arrival to ED pt being bagged but will respond by shaking his head.

## 2015-12-20 NOTE — H&P (Signed)
PULMONARY / CRITICAL CARE MEDICINE   Name: Eddie [name] MRN: [id number] DOB: [birth year]    ADMISSION DATE:  12/20/2015  REFERRING MD:  Dr. Rogene Houston  CHIEF COMPLAINT:  SOB  HISTORY OF PRESENT ILLNESS: 65 yo male was in hospital from 5/20 to 5/24 with UTI, E coli bacteremia, acute on chronic combined CHF, acute pulmonary edema and VDRF.  He developed sudden onset of shortness of breath on 5/29.  He was not able to speak on arrival by EMS.  He was tried on CPAP, but became unresponsive.  He required intubation.  He had BP of 216/138.  He was started on NTG gtt.  His blood pressure improved.  PAST MEDICAL HISTORY: He  has a past medical history of PUD (peptic ulcer disease); CAD (coronary artery disease); and CVA (cerebral infarction).  PAST SURGICAL HISTORY: He  has past surgical history that includes Repair of Peptic Ulcer; Cardiac catheterization (N/A, 09/28/2015); and Cardiac catheterization (N/A, 10/08/2015).  FAMILY HISTORY: His family history is unable to be obtained due to altered mental status.  SOCIAL HISTORY: He  reports that he has been smoking.  He does not have any smokeless tobacco history on file. He reports that he does not drink alcohol.  No Known Allergies  No current facility-administered medications on file prior to encounter.   Current Outpatient Prescriptions on File Prior to Encounter  Medication Sig  . aspirin 81 MG chewable tablet Chew 1 tablet (81 mg total) by mouth daily.  Marland Kitchen atorvastatin (LIPITOR) 80 MG tablet Take 1 tablet (80 mg total) by mouth daily at 6 PM.  . blood glucose meter kit and supplies KIT Dispense based on patient and insurance preference. Use up to four times daily as directed. (FOR ICD-9 250.00, 250.01).  . carvedilol (COREG) 6.25 MG tablet Take 1 tablet (6.25 mg total) by mouth 2 (two) times daily with a meal.  . cefpodoxime (VANTIN) 200 MG tablet Take 1 tablet (200 mg total) by mouth every 12 (twelve) hours.  . clopidogrel (PLAVIX) 75  MG tablet Take 4 tablets today then take 1 tablet by mouth daily after that (Patient taking differently: Take 75 mg by mouth daily. Take 4 tablets today then take 1 tablet by mouth daily after tha)  . enoxaparin (LOVENOX) 80 MG/0.8ML injection Inject 0.7 mLs (70 mg total) into the skin 2 (two) times daily.  Marland Kitchen ipratropium-albuterol (DUONEB) 0.5-2.5 (3) MG/3ML SOLN Take 3 mLs by nebulization every 2 (two) hours as needed.  Marland Kitchen lisinopril (PRINIVIL,ZESTRIL) 5 MG tablet Take 5 mg by mouth daily.  . metFORMIN (GLUCOPHAGE) 850 MG tablet Take 1 tablet (850 mg total) by mouth daily with breakfast.  . nitroGLYCERIN (NITROSTAT) 0.4 MG SL tablet Use one pill every 5 minutes X 3 if needed for chest pain. Contact MD if pain does not improve (Patient taking differently: Place 0.4 mg under the tongue every 5 (five) minutes as needed for chest pain. Use one pill every 5 minutes X 3 if needed for chest pain. Contact MD if pain does not improve)  . spironolactone (ALDACTONE) 25 MG tablet Take 0.5 tablets (12.5 mg total) by mouth daily.  Marland Kitchen warfarin (COUMADIN) 5 MG tablet Take 1 tablet daily or as directed by coumadin clinic (Patient taking differently: Take 5-7.5 mg by mouth daily. Take 1 1/2 tablet (7.5 mg) by mouth on Monday and Friday, take 1 tablet (5 mg) on Sunday, Tuesday, Wednesday, Thursday, Saturday)    REVIEW OF SYSTEMS: Unable to obtain.  SUBJECTIVE:   VITAL  SIGNS: BP 140/94 mmHg  Pulse 76  Temp(Src) 97.3 F (36.3 C) (Axillary)  Resp 26  Ht '5\' 9"'$  (1.753 m)  Wt 162 lb (73.483 kg)  BMI 23.91 kg/m2  SpO2 100%  VENTILATOR SETTINGS: Vent Mode:  [-] PRVC FiO2 (%):  [50 %-100 %] 50 % Set Rate:  [14 bmp-26 bmp] 26 bmp Vt Set:  [500 mL-570 mL] 570 mL PEEP:  [5 cmH20] 5 cmH20 Plateau Pressure:  [33 cmH20] 33 cmH20  INTAKE / OUTPUT: I/O last 3 completed shifts: In: -  Out: 45 [Urine:45]  PHYSICAL EXAMINATION: General: sedated Neuro: RASS -4 HEENT: ETT in place Cardiac: regular Chest: b/l  crackles Abd: soft, non tender Ext: no edema Skin: no rashes  LABS:  BMET  Recent Labs Lab 12/14/15 0244 12/15/15 0330 12/20/15 1924  NA 136 139 139  K 3.4* 3.6 3.8  CL 106 107 105  CO2 '24 25 24  '$ BUN '16 12 7  '$ CREATININE 1.11 1.05 1.35*  GLUCOSE 192* 164* 393*    Electrolytes  Recent Labs Lab 12/14/15 0244 12/15/15 0330 12/20/15 1924  CALCIUM 8.1* 8.3* 8.5*  PHOS 2.7  --   --     CBC  Recent Labs Lab 12/14/15 0244 12/15/15 0330 12/20/15 1924  WBC 7.9 7.4 10.9*  HGB 8.9* 8.8* 9.7*  HCT 29.4* 28.9* 33.8*  PLT 254 291 355    Coag's  Recent Labs Lab 12/14/15 0244 12/15/15 0330  INR 1.94* 1.80*    Sepsis Markers  Recent Labs Lab 12/20/15 2113  LATICACIDVEN 4.00*    ABG  Recent Labs Lab 12/20/15 2106  PHART 7.471*  PCO2ART 39.3  PO2ART 101.0*    Liver Enzymes  Recent Labs Lab 12/14/15 0244 12/20/15 1924  AST  --  39  ALT  --  23  ALKPHOS  --  112  BILITOT  --  0.5  ALBUMIN 2.4* 3.1*    Cardiac Enzymes No results for input(s): TROPONINI, PROBNP in the last 168 hours.  Glucose  Recent Labs Lab 12/14/15 0605 12/14/15 1125 12/14/15 1631 12/14/15 2056 12/15/15 0642 12/15/15 1142  GLUCAP 192* 251* 186* 194* 167* 207*    Imaging Dg Chest Port 1 View  12/20/2015  CLINICAL DATA:  Endotracheal tube placement, for respiratory distress. Initial encounter. EXAM: PORTABLE CHEST 1 VIEW COMPARISON:  Chest radiograph performed 12/12/2015 FINDINGS: The patient's endotracheal tube is seen ending 4-5 cm above the carina. Fluffy bilateral central airspace opacification may reflect multifocal pneumonia or pulmonary edema. This is new from the prior study. No pleural effusion or pneumothorax is seen. The cardiomediastinal silhouette is borderline enlarged. No acute osseous abnormalities are identified. IMPRESSION: 1. Endotracheal tube seen ending 4-5 cm above the carina. 2. Fluffy bilateral central airspace opacification may reflect  multifocal pneumonia or pulmonary edema. This is new from the prior study. 3. Borderline cardiomegaly. Electronically Signed   By: Garald Balding M.D.   On: 12/20/2015 18:33     STUDIES:   CULTURES: 5/29 Blood >>  ANTIBIOTICS: 5/23 Cefpodoxime >>  5/29 Vancomycin >> 5/29 5/29 Zosyn >> 5/29  SIGNIFICANT EVENTS: 5/20 to 5/24 with UTI, E coli bacteremia, acute on chronic combined CHF, acute pulmonary edema and VDRF 5/29 Admit, VDRF, CHF  LINES/TUBES: 5/29 ETT >>   DISCUSSION: 65 yo male presented with dyspnea, acute encephalopathy, respiratory failure, HTN urgency, acute pulmonary edema, lactic acidosis.  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  ASSESSMENT / PLAN:  PULMONARY A: Acute hypoxic respiratory failure 2nd to acute pulmonary edema. P:   Full vent support F/u CXR, ABG  CARDIOVASCULAR A:  HTN urgency. Acute on chronic combined CHF. CAD s/p recent DES. Hx of HLD. P:  ASA, lipitor, coreg, plavix, lisinopril, aldactone, lasix  RENAL A:   CKD 2. Lactic acidosis. P:   Monitor renal fx Replace electrolytes as needed  GASTROINTESTINAL A:   Nutrition. P:   NPO Protonix for SUP  HEMATOLOGIC A:   Anemia of chronic disease and critical illness. P:  F/u CBC Lovenox for DVT prophylaxis  INFECTIOUS A:   B/l pulmonary infiltrates and lactic acidosis more likely from acute pulmonary edema and hypoxia rather than sepsis. Recent UTI with E coli bacteremia. P:   Hold further Abx for pneumonia Check procalcitonin F/u lactic acid Complete outpt course of cefpodoxime >> last dose on 5/30  ENDOCRINE A:   DM. P:   SSI Hold outpt metformin for now  NEUROLOGIC A:   Acute metabolic encephalopathy >> resolved. Hx of CVA March 2017. P:   Coumadin per pharmacy  CC time 42 minutes.  Chesley Mires, MD [facility] Pulmonary/Critical Care 12/20/2015, 10:26 PM Pager:   [phone] After 3pm call: [phone]

## 2015-12-20 NOTE — ED Notes (Signed)
Drainage bag replaced, due to broken valve (collection nozzle). Informed Kim - RN.

## 2015-12-20 NOTE — ED Notes (Signed)
Attempted report 

## 2015-12-20 NOTE — ED Notes (Signed)
Pt continues to rest at this time

## 2015-12-20 NOTE — ED Notes (Signed)
Pt resting quietly at this time.  NTG gtt remains at 46mcg/min.   Propofol gtt remains at 45mcg/min.

## 2015-12-20 NOTE — Progress Notes (Signed)
ABG Results given to Dr. Deretha Emory.

## 2015-12-20 NOTE — ED Provider Notes (Signed)
I saw and evaluated the patient, reviewed the resident's note and I agree with the findings and plan.   EKG Interpretation   Date/Time:  Monday Dec 20 2015 18:02:13 EDT Ventricular Rate:  100 PR Interval:  159 QRS Duration: 92 QT Interval:  350 QTC Calculation: 451 R Axis:   -14 Text Interpretation:  Sinus tachycardia Abnormal R-wave progression, early  transition Consider anterior infarct Abnormal T, consider ischemia,  lateral leads Confirmed by Kleigh Hoelzer  MD, Allyanna Appleman [phone]) on 12/20/2015  6:36:55 PM       Results for orders placed or performed during the hospital encounter of 12/20/15  I-stat troponin, ED  (not at [facility], [facility])  Result Value Ref Range   Troponin i, poc 0.03 0.00 - 0.08 ng/mL   Comment 3           Ct Head Wo Contrast  12/11/2015  CLINICAL DATA:  Decreased mental status. History of stroke in March. Concern for sepsis due to bladder infection. EXAM: CT HEAD WITHOUT CONTRAST TECHNIQUE: Contiguous axial images were obtained from the base of the skull through the vertex without intravenous contrast. COMPARISON:  None. FINDINGS: Brain: Geographic area of decreased attenuation involving the right centrum semiovale (is 21, series 201), the sequela of microvascular ischemic disease. Old infarct within the right basal ganglia (image 15, series 201). The gray-white differentiation is otherwise well maintained without CT evidence of acute large territory infarct. No intraparenchymal or extra-axial mass or hemorrhage. Normal size and configuration of the ventricles and basilar cisterns. No midline shift. Vascular: Minimal intracranial atherosclerosis Skull: Negative for fracture or focal lesion. Sinuses/Orbits: Post right-sided cataract surgery. There is underpneumatization the bilateral frontal sinuses. The remaining paranasal sinuses and mastoid air cells are normally aerated. No air-fluid levels. Other: The patient is intubated. Regional soft tissues appear normal. 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 Electronically Signed   By: Simonne Come M.D.   On: 12/11/2015 08:27   Dg Chest Port 1 View  12/20/2015  CLINICAL DATA:  Endotracheal tube placement, for respiratory distress. Initial encounter. EXAM: PORTABLE CHEST 1 VIEW COMPARISON:  Chest radiograph performed 12/12/2015 FINDINGS: The patient's endotracheal tube is seen ending 4-5 cm above the carina. Fluffy bilateral central airspace opacification may reflect multifocal pneumonia or pulmonary edema. This is new from the prior study. No pleural effusion or pneumothorax is seen. The cardiomediastinal silhouette is borderline enlarged. No acute osseous abnormalities are identified. IMPRESSION: 1. Endotracheal tube seen ending 4-5 cm above the carina. 2. Fluffy bilateral central airspace opacification may reflect multifocal pneumonia or pulmonary edema. This is new from the prior study. 3. Borderline cardiomegaly. Electronically Signed   By: Roanna Raider M.D.   On: 12/20/2015 18:33   Dg Chest Port 1 View  12/12/2015  CLINICAL DATA:  Ventilator dependent respiratory failure. STEMI and stroke in March, 2017. Patient admitted yesterday for acute on chronic shortness of breath and fever. EXAM: PORTABLE CHEST 1 VIEW COMPARISON:  12/11/2015. FINDINGS: Endotracheal tube tip in satisfactory position projecting approximately 5 cm above the carina. Nasogastric tube courses below the diaphragm into the stomach. Cardiac silhouette upper normal in size to slightly enlarged for AP portable technique. Lungs clear. Bronchovascular markings normal. Pulmonary vascularity normal. No visible pleural effusions. No pneumothorax. Barium from the swallowing function study performed 5 days ago still present in the visualized splenic flexure the colon. IMPRESSION: 1. Support apparatus satisfactory. 2.  No acute cardiopulmonary disease. Electronically Signed   By: Hulan Saas M.D.   On: 12/12/2015  10:19   Dg Chest  Port 1 View  12/11/2015  CLINICAL DATA:  Respiratory failure. History of coronary artery disease. EXAM: PORTABLE CHEST 1 VIEW COMPARISON:  12/11/2015 at 5:25 a.m. FINDINGS: Endotracheal tube is well positioned measuring 3 cm above the carina. Nasal/orogastric tube is also well positioned passing below the diaphragm into the stomach. Cardiac silhouette is borderline enlarged. No mediastinal or hilar masses or evidence of adenopathy. There is bilateral interstitial thickening with central hazy airspace opacity. Findings suggest pulmonary edema. No convincing pleural effusion. No pneumothorax on this semi-erect study. IMPRESSION: 1. Findings similar to the earlier study. 2. Interstitial thickening with mild hazy central airspace lung opacity is most likely due to pulmonary edema/congestive heart failure. 3. Endotracheal and nasogastric tubes are stable and well positioned. Electronically Signed   By: Amie Portland M.D.   On: 12/11/2015 08:11   Dg Chest Portable 1 View  12/11/2015  CLINICAL DATA:  Endotracheal tube placement EXAM: PORTABLE CHEST 1 VIEW COMPARISON:  None. FINDINGS: Endotracheal tube present with tip measuring 3.7 cm above the carina. Enteric tube is present with tip off the field of view but below the left hemidiaphragm. Proximal side hole is positioned just below the expected location of the EG junction. Heart size is normal. There are bilateral perihilar infiltrates suggesting edema or pneumonia. No blunting of costophrenic angles. No pneumothorax. IMPRESSION: Appliances appear in satisfactory location. Bilateral perihilar infiltrates suggesting edema or pneumonia. Electronically Signed   By: Burman Nieves M.D.   On: 12/11/2015 06:16   Dg Swallowing Func-speech Pathology  12/07/2015  Objective Swallowing Evaluation: Type of Study: MBS-Modified Barium Swallow Study Patient Details Name: KAMON FAHR MRN: [id number] Date of Birth: [birth year] Today's Date: 12/07/2015 Time: SLP Start Time (ACUTE  ONLY): 1033-SLP Stop Time (ACUTE ONLY): 1049 SLP Time Calculation (min) (ACUTE ONLY): 16 min Past Medical History: Past Medical History Diagnosis Date . PUD (peptic ulcer disease)  . CAD (coronary artery disease)    a. STEMI 09/2015 w/ DES to Prox LAD . CVA (cerebral infarction)    a. 09/2015: acute small right frontal lobe infarct Past Surgical History: Past Surgical History Procedure Laterality Date . Repair of peptic ulcer   . Cardiac catheterization N/A 09/28/2015   Procedure: Left Heart Cath and Coronary Angiography;  Surgeon: Runell Gess, MD;  Location: [facility] INVASIVE CV LAB;  Service: Cardiovascular;  Laterality: N/A; . Cardiac catheterization N/A 10/08/2015   Procedure: Left Heart Cath and Coronary Angiography;  Surgeon: Lyn Records, MD;  Location: [facility] INVASIVE CV LAB;  Service: Cardiovascular;  Laterality: N/A; HPI: 65 year old male referred for outpatient MBS to determine appropriateness for advanced diet. Pt was admitted 09/28/15 with chest pain, cardiac arrest (brief chest compressions),  NSTEMI, 13 day intubation. CVA per MRI 3/14 with acute small right frontal infarct, old left caudate and cerebellar infarcts. No Data Recorded Assessment / Plan / Recommendation CHL IP CLINICAL IMPRESSIONS 10/21/2015 Therapy Diagnosis Moderate pharyngeal phase dysphagia Clinical Impression Pt presents with mild pharyngeal dysphagia, characterized primarily by delayed swallow reflex, with trigger at the pyriform on liquids, at the vallecula on puree and solid.  This increases risk for airway compromise. Pt tolerated multiple presentations of thin via cup sip with only intermittent flash penetration which cleared completely and was not aspirated. Silent aspiration of thin via straw was noted. No post-swallow residue was noted following any consistency, however, pt continues to demonstrate independent dry swallow on each bolus. Barium tablet was noted to clear the hypopharynx without delay,  however, there was what appeared to  be osteophytes at C4-C7, which may be causing globus sensation with meds. Esophageal sweep revealed the distal esophagus slow to clear, with what appeared to be tertiary contractions. GI consult may be beneficial to evaluate esophageal dysmotility. Recommend advancing diet to regular solids and thin liquids via cup (no straw). Safe swallow strategies and behavioral and dietary suggestions for management of esophageal dysmotility were reviewed and provided in written form. Pt was encouraged to follow up with Outpatient SLP.   Impact on safety and function Mild aspiration risk   CHL IP TREATMENT RECOMMENDATION 10/15/2015 Treatment Recommendations Therapy as outlined in treatment plan below   Prognosis 12/07/2015 Prognosis for Safe Diet Advancement Good CHL IP DIET RECOMMENDATION 10/22/2015 Compensations Minimize environmental distractions;Slow rate;Small sips/bites;Multiple dry swallows after each bite/sip Postural Changes Upright during meals and 30 minutes after po intake to facilitate esophageal clearing   CHL IP OTHER RECOMMENDATIONS 10/21/2015 Recommended Consults GI consult for esophageal workup Oral Care Recommendations Oral care BID Other Recommendations Return to OP SLP    CHL IP FOLLOW UP RECOMMENDATIONS 10/15/2015 Follow up Recommendations Inpatient Rehab   CHL IP FREQUENCY AND DURATION 10/15/2015 Speech Therapy Frequency (ACUTE ONLY) min 3x week Treatment Duration 2 weeks   CHL IP ORAL PHASE 12/07/2015 Oral Phase WFL  CHL IP PHARYNGEAL PHASE 12/07/2015 Pharyngeal Phase Impaired  CHL IP CERVICAL ESOPHAGEAL PHASE 10/21/2015 Cervical Esophageal Phase WFL CHL IP GO 12/07/2015 Functional Assessment Tool Used asha noms, clinical judgment, MBS Functional Limitations Swallowing Swallow Current Status (S1683) CI Swallow Goal Status (F2902) CI Swallow Discharge Status (X1155) CI Leigh Aurora 12/07/2015, 12:57 PM  Celia B. Bueche, [facility], CCC-SLP [phone]              CRITICAL CARE Performed by: Miski Feldpausch Total  critical care time: 30 minutes Critical care time was exclusive of separately billable procedures and treating other patients. Critical care was necessary to treat or prevent imminent or life-threatening deterioration. Critical care was time spent personally by me on the following activities: development of treatment plan with patient and/or surrogate as well as nursing, discussions with consultants, evaluation of patient's response to treatment, examination of patient, obtaining history from patient or surrogate, ordering and performing treatments and interventions, ordering and review of laboratory studies, ordering and review of radiographic studies, pulse oximetry and re-evaluation of patient's condition.  Patient seen by me upon arrival along with the resident. Patient initially came in verbalizing some. Concern was for pulmonary edema respiratory distress. Review of patient's records show he was just admitted and discharged home on May 25 and was intubated at that time. Symptoms here patient with marked hypertension seem to have frothy sputum. Patient then started not to verbalize well was still struggling to breathe so patient was intubated. Intubation occurred without any difficulties. Following intubation patient was started on nitroglycerin drip to bring his pressures down. Chest x-ray confirmed pulmonary edema or or a multifocal pneumonia. Clinically seem to be more pulmonary edema. Patient also will be given Lasix and a temp Foley was started. Patient will clearly require admission to the critical care service again. Will correlate rest of labs with concern for possible pneumonia versus just pulmonary edema. Clinically seem to be consistent with flash pulmonary edema.    Vanetta Mulders, MD 12/20/15 1840

## 2015-12-20 NOTE — ED Provider Notes (Signed)
CSN: [id number]     Arrival date & time 12/20/15  1743 History   First MD Initiated Contact with Patient 12/20/15 1800     Chief Complaint  Patient presents with  . Respiratory Distress     (Consider location/radiation/quality/duration/timing/severity/associated sxs/prior Treatment) Patient is a 65 y.o. male presenting with general illness. The history is provided by the EMS personnel and medical records.  Illness Location:  Shortness of breath Severity:  Severe Onset quality:  Sudden Duration:  1 day Timing:  Constant Progression:  Worsening Chronicity:  Recurrent Context:  Patient recently admitted for acute hypoxic respiratory failure in the setting of a UTI. Discharged from the ICU 5 days ago. Presenting now with similar clinical setting. One-day of progressive worsening of shortness of breath. History limited secondary to the patient's mental status. Associated symptoms: shortness of breath     Past Medical History  Diagnosis Date  . PUD (peptic ulcer disease)   . CAD (coronary artery disease)     a. STEMI 09/2015 w/ DES to Prox LAD  . CVA (cerebral infarction)     a. 09/2015: acute small right frontal lobe infarct   Past Surgical History  Procedure Laterality Date  . Repair of peptic ulcer    . Cardiac catheterization N/A 09/28/2015    Procedure: Left Heart Cath and Coronary Angiography;  Surgeon: Lorretta Harp, MD;  Location: [facility];  Service: Cardiovascular;  Laterality: N/A;  . Cardiac catheterization N/A 10/08/2015    Procedure: Left Heart Cath and Coronary Angiography;  Surgeon: Belva Crome, MD;  Location: [facility];  Service: Cardiovascular;  Laterality: N/A;   No family history on file. Social History  Substance Use Topics  . Smoking status: Current Every Day Smoker -- 0.25 packs/day  . Smokeless tobacco: None  . Alcohol Use: No    Review of Systems  Unable to perform ROS: Acuity of condition  Respiratory: Positive for shortness of  breath.       Allergies  Review of patient's allergies indicates no known allergies.  Home Medications   Prior to Admission medications   Medication Sig Start Date End Date Taking? Authorizing Provider  aspirin 81 MG chewable tablet Chew 1 tablet (81 mg total) by mouth daily. 10/26/15   Bary Leriche, PA-C  atorvastatin (LIPITOR) 80 MG tablet Take 1 tablet (80 mg total) by mouth daily at 6 PM. 11/24/15   Minus Breeding, MD  blood glucose meter kit and supplies KIT Dispense based on patient and insurance preference. Use up to four times daily as directed. (FOR ICD-9 250.00, 250.01). 10/26/15   Ivan Anchors Love, PA-C  carvedilol (COREG) 6.25 MG tablet Take 1 tablet (6.25 mg total) by mouth 2 (two) times daily with a meal. 11/24/15   Minus Breeding, MD  cefpodoxime (VANTIN) 200 MG tablet Take 1 tablet (200 mg total) by mouth every 12 (twelve) hours. 12/15/15   Theodis Blaze, MD  clopidogrel (PLAVIX) 75 MG tablet Take 4 tablets today then take 1 tablet by mouth daily after that Patient taking differently: Take 75 mg by mouth daily. Take 4 tablets today then take 1 tablet by mouth daily after tha 10/28/15   [name] G [name], PA-C  ipratropium-albuterol (DUONEB) 0.5-2.5 (3) MG/3ML SOLN Take 3 mLs by nebulization every 2 (two) hours as needed. 12/15/15   Theodis Blaze, MD  lisinopril (PRINIVIL,ZESTRIL) 5 MG tablet Take 5 mg by mouth daily. 11/24/15   Historical Provider, MD  metFORMIN (GLUCOPHAGE) 850  MG tablet Take 1 tablet (850 mg total) by mouth daily with breakfast. 10/26/15   Ivan Anchors Love, PA-C  nitroGLYCERIN (NITROSTAT) 0.4 MG SL tablet Use one pill every 5 minutes X 3 if needed for chest pain. Contact MD if pain does not improve Patient taking differently: Place 0.4 mg under the tongue every 5 (five) minutes as needed for chest pain. Use one pill every 5 minutes X 3 if needed for chest pain. Contact MD if pain does not improve 10/26/15   Ivan Anchors Love, PA-C  spironolactone (ALDACTONE) 25 MG tablet Take 0.5  tablets (12.5 mg total) by mouth daily. 10/26/15   Bary Leriche, PA-C  warfarin (COUMADIN) 5 MG tablet Take 1 tablet daily or as directed by coumadin clinic Patient taking differently: Take 5-7.5 mg by mouth daily. Take 1 1/2 tablet (7.5 mg) by mouth on Monday and Friday, take 1 tablet (5 mg) on Sunday, Tuesday, Wednesday, Thursday, Saturday 10/28/15   Evelene Croon [name], PA-C   BP 151/96 mmHg  Pulse 74  Temp(Src) 97.3 F (36.3 C) (Axillary)  Resp 20  Ht _0  (1.753 m)  Wt 73.483 kg  BMI 23.91 kg/m2  SpO2 99% Physical Exam  Constitutional: He appears lethargic. He appears ill. He appears distressed. Nasal cannula and face mask in place.  HENT:  Head: Normocephalic and atraumatic.  Eyes: EOM are normal. Pupils are equal, round, and reactive to light.  Cardiovascular: Regular rhythm and intact distal pulses.  Tachycardia present.   Pulmonary/Chest: Accessory muscle usage present. Tachypnea noted. He is in respiratory distress. He has rhonchi. He has rales.  Abdominal: Soft. There is no tenderness.  Musculoskeletal: He exhibits no edema.       Right lower leg: He exhibits no swelling and no edema.       Left lower leg: He exhibits no swelling and no edema.  Neurological: He appears lethargic. GCS eye subscore is 2. GCS verbal subscore is 1. GCS motor subscore is 4.  Skin: Skin is warm and dry.    ED Course  .Intubation Date/Time: 12/20/2015 6:12 PM Performed by: Maryan Puls Authorized by: Maryan Puls Consent: The procedure was performed in an emergent situation. Indications: respiratory distress,  respiratory failure,  hypoxemia and  airway protection Intubation method: direct Patient status: paralyzed (RSI) Preoxygenation: nonrebreather mask and BVM Sedatives: etomidate Paralytic: succinylcholine Laryngoscope size: Mac 3 Tube size: 7.5 mm Tube type: cuffed Number of attempts: 1 Cricoid pressure: yes Cords visualized: yes Post-procedure assessment: chest rise and CO2  detector Breath sounds: equal and absent over the epigastrium Cuff inflated: yes ETT to lip: 25 cm Tube secured with: ETT holder Chest x-ray interpreted by me and radiologist. Patient tolerance: Patient tolerated the procedure well with no immediate complications   (including critical care time) Labs Review Labs Reviewed  COMPREHENSIVE METABOLIC PANEL - Abnormal; Notable for the following:    Glucose, Bld 393 (*)    Creatinine, Ser 1.35 (*)    Calcium 8.5 (*)    Albumin 3.1 (*)    GFR calc non Af Amer 54 (*)    All other components within normal limits  CBC WITH DIFFERENTIAL/PLATELET - Abnormal; Notable for the following:    WBC 10.9 (*)    RBC 3.93 (*)    Hemoglobin 9.7 (*)    HCT 33.8 (*)    MCH 24.7 (*)    MCHC 28.7 (*)    RDW 16.6 (*)    Lymphs Abs 4.6 (*)    All  other components within normal limits  BRAIN NATRIURETIC PEPTIDE - Abnormal; Notable for the following:    B Natriuretic Peptide 1475.7 (*)    All other components within normal limits  URINALYSIS, ROUTINE W REFLEX MICROSCOPIC (NOT AT [facility]) - Abnormal; Notable for the following:    APPearance CLOUDY (*)    Glucose, UA 250 (*)    Hgb urine dipstick SMALL (*)    Protein, ur 100 (*)    Leukocytes, UA MODERATE (*)    All other components within normal limits  URINE MICROSCOPIC-ADD ON - Abnormal; Notable for the following:    Squamous Epithelial / LPF 0-5 (*)    Bacteria, UA MANY (*)    Casts HYALINE CASTS (*)    All other components within normal limits  TROPONIN I - Abnormal; Notable for the following:    Troponin I 0.05 (*)    All other components within normal limits  PROTIME-INR - Abnormal; Notable for the following:    Prothrombin Time 24.3 (*)    INR 2.20 (*)    All other components within normal limits  GLUCOSE, CAPILLARY - Abnormal; Notable for the following:    Glucose-Capillary 418 (*)    All other components within normal limits  I-STAT ARTERIAL BLOOD GAS, ED - Abnormal; Notable for the  following:    pH, Arterial 7.471 (*)    pO2, Arterial 101.0 (*)    Bicarbonate 28.7 (*)    Acid-Base Excess 5.0 (*)    All other components within normal limits  I-STAT CG4 LACTIC ACID, ED - Abnormal; Notable for the following:    Lactic Acid, Venous 4.00 (*)    All other components within normal limits  CULTURE, BLOOD (ROUTINE X 2)  CULTURE, BLOOD (ROUTINE X 2)  URINE CULTURE  PROCALCITONIN  TRIGLYCERIDES  PROCALCITONIN  BLOOD GAS, ARTERIAL  COMPREHENSIVE METABOLIC PANEL  MAGNESIUM  PHOSPHORUS  TROPONIN I  TROPONIN I  PROTIME-INR  I-STAT TROPOININ, ED  I-STAT CG4 LACTIC ACID, ED    Imaging Review Dg Chest Port 1 View  12/20/2015  CLINICAL DATA:  Endotracheal tube placement, for respiratory distress. Initial encounter. EXAM: PORTABLE CHEST 1 VIEW COMPARISON:  Chest radiograph performed 12/12/2015 FINDINGS: The patient's endotracheal tube is seen ending 4-5 cm above the carina. Fluffy bilateral central airspace opacification may reflect multifocal pneumonia or pulmonary edema. This is new from the prior study. No pleural effusion or pneumothorax is seen. The cardiomediastinal silhouette is borderline enlarged. No acute osseous abnormalities are identified. IMPRESSION: 1. Endotracheal tube seen ending 4-5 cm above the carina. 2. Fluffy bilateral central airspace opacification may reflect multifocal pneumonia or pulmonary edema. This is new from the prior study. 3. Borderline cardiomegaly. Electronically Signed   By: Garald Balding M.D.   On: 12/20/2015 18:33   I have personally reviewed and evaluated these images and lab results as part of my medical decision-making.   EKG Interpretation   Date/Time:  Monday Dec 20 2015 18:02:13 EDT Ventricular Rate:  100 PR Interval:  159 QRS Duration: 92 QT Interval:  350 QTC Calculation: 451 R Axis:   -14 Text Interpretation:  Sinus tachycardia Abnormal R-wave progression, early  transition Consider anterior infarct Abnormal T, consider  ischemia,  lateral leads Confirmed by ZACKOWSKI  MD, SCOTT ([id number]) on 12/20/2015  6:36:55 PM      MDM   Final diagnoses:  Acute respiratory failure with hypercapnia (HCC)  Acute pulmonary edema (HCC)  UTI (lower urinary tract infection)    65 year old male with a  past nuchal history of CAD, recent admission for UTI requiring intubation for acute hypoxic respiratory failure, presenting with acute hypoxic respiratory failure. Initial oxygen saturation in the 70s on a nonrebreather. Intubated the patient as above for acute hypoxic respiratory failure. Feel the patient most likely has flash pulmonary edema. BNP significantly elevated to nearly 1500. Initial blood pressure was in the systolic 256L. Started the patient on a nitroglycerin drip. Initial concern for potential infection given recent intubation in setting of chest x-ray with question of pneumonia, and lactic acidosis. Started the patient on appropriate IV antibiotics. Did not give the patient the full 30 mL per kilo IV fluid bolus given the setting of flash pulmonary edema. Initial rhythm strip from EMS showed concern for potential anterior infarction; however on 12-lead EKG here is not seen. Initial troponin is negative.  Spoke with the intensivist. We'll admit to their service for further intervention and observation.  Maryan Puls, MD 12/21/15 [phone]

## 2015-12-20 NOTE — ED Notes (Signed)
Pt biting endo tube.  Propofol 20mg  bolus given

## 2015-12-20 NOTE — ED Notes (Signed)
Foley site appears fine.

## 2015-12-21 ENCOUNTER — Inpatient Hospital Stay (HOSPITAL_COMMUNITY): Payer: BLUE CROSS/BLUE SHIELD

## 2015-12-21 ENCOUNTER — Ambulatory Visit: Payer: BLUE CROSS/BLUE SHIELD

## 2015-12-21 DIAGNOSIS — Z87898 Personal history of other specified conditions: Secondary | ICD-10-CM

## 2015-12-21 DIAGNOSIS — J811 Chronic pulmonary edema: Secondary | ICD-10-CM | POA: Insufficient documentation

## 2015-12-21 DIAGNOSIS — Z4659 Encounter for fitting and adjustment of other gastrointestinal appliance and device: Secondary | ICD-10-CM | POA: Insufficient documentation

## 2015-12-21 DIAGNOSIS — J810 Acute pulmonary edema: Secondary | ICD-10-CM

## 2015-12-21 DIAGNOSIS — J9602 Acute respiratory failure with hypercapnia: Secondary | ICD-10-CM

## 2015-12-21 LAB — POCT I-STAT 3, ART BLOOD GAS (G3+)
ACID-BASE EXCESS: 6 mmol/L — AB (ref 0.0–2.0)
Bicarbonate: 27.4 mEq/L — ABNORMAL HIGH (ref 20.0–24.0)
O2 SAT: 99 %
PCO2 ART: 28.7 mmHg — AB (ref 35.0–45.0)
PH ART: 7.588 — AB (ref 7.350–7.450)
TCO2: 28 mmol/L (ref 0–100)
pO2, Arterial: 118 mmHg — ABNORMAL HIGH (ref 80.0–100.0)

## 2015-12-21 LAB — COMPREHENSIVE METABOLIC PANEL
ALT: 19 U/L (ref 17–63)
AST: 23 U/L (ref 15–41)
Albumin: 2.7 g/dL — ABNORMAL LOW (ref 3.5–5.0)
Alkaline Phosphatase: 96 U/L (ref 38–126)
Anion gap: 10 (ref 5–15)
BUN: 12 mg/dL (ref 6–20)
CHLORIDE: 103 mmol/L (ref 101–111)
CO2: 26 mmol/L (ref 22–32)
Calcium: 8.7 mg/dL — ABNORMAL LOW (ref 8.9–10.3)
Creatinine, Ser: 1.15 mg/dL (ref 0.61–1.24)
Glucose, Bld: 212 mg/dL — ABNORMAL HIGH (ref 65–99)
POTASSIUM: 2.9 mmol/L — AB (ref 3.5–5.1)
Sodium: 139 mmol/L (ref 135–145)
Total Bilirubin: 0.6 mg/dL (ref 0.3–1.2)
Total Protein: 6.9 g/dL (ref 6.5–8.1)

## 2015-12-21 LAB — BASIC METABOLIC PANEL
ANION GAP: 9 (ref 5–15)
BUN: 10 mg/dL (ref 6–20)
CHLORIDE: 105 mmol/L (ref 101–111)
CO2: 28 mmol/L (ref 22–32)
Calcium: 9 mg/dL (ref 8.9–10.3)
Creatinine, Ser: 1.16 mg/dL (ref 0.61–1.24)
GFR calc Af Amer: >60 mL/min (ref >60)
GLUCOSE: 123 mg/dL — AB (ref 65–99)
POTASSIUM: 5 mmol/L (ref 3.5–5.1)
Sodium: 142 mmol/L (ref 135–145)

## 2015-12-21 LAB — GLUCOSE, CAPILLARY
GLUCOSE-CAPILLARY: 122 mg/dL — AB (ref 65–99)
GLUCOSE-CAPILLARY: 151 mg/dL — AB (ref 65–99)
GLUCOSE-CAPILLARY: 342 mg/dL — AB (ref 65–99)
GLUCOSE-CAPILLARY: 379 mg/dL — AB (ref 65–99)
GLUCOSE-CAPILLARY: 418 mg/dL — AB (ref 65–99)
Glucose-Capillary: 190 mg/dL — ABNORMAL HIGH (ref 65–99)
Glucose-Capillary: 137 mg/dL — ABNORMAL HIGH (ref 65–99)

## 2015-12-21 LAB — LACTIC ACID, PLASMA: Lactic Acid, Venous: 1.6 mmol/L (ref 0.5–2.0)

## 2015-12-21 LAB — PROTIME-INR
INR: 2.19 — AB (ref 0.00–1.49)
PROTHROMBIN TIME: 24.2 s — AB (ref 11.6–15.2)

## 2015-12-21 LAB — TROPONIN I
TROPONIN I: 0.14 ng/mL — AB (ref <0.031)
Troponin I: 0.14 ng/mL — ABNORMAL HIGH (ref <0.031)

## 2015-12-21 LAB — PHOSPHORUS
PHOSPHORUS: 1.9 mg/dL — AB (ref 2.5–4.6)
PHOSPHORUS: 3 mg/dL (ref 2.5–4.6)

## 2015-12-21 LAB — MAGNESIUM: MAGNESIUM: 1.6 mg/dL — AB (ref 1.7–2.4)

## 2015-12-21 LAB — PROCALCITONIN: PROCALCITONIN: 1.99 ng/mL

## 2015-12-21 MED ORDER — POTASSIUM CHLORIDE 10 MEQ/100ML IV SOLN
INTRAVENOUS | Status: AC
  Filled 2015-12-21: qty 100

## 2015-12-21 MED ORDER — POTASSIUM CHLORIDE 10 MEQ/100ML IV SOLN
10.0000 meq | INTRAVENOUS | Status: AC
  Administered 2015-12-21 (×2): 10 meq via INTRAVENOUS
  Filled 2015-12-21: qty 100

## 2015-12-21 MED ORDER — MAGNESIUM SULFATE 2 GM/50ML IV SOLN
2.0000 g | Freq: Once | INTRAVENOUS | Status: AC
  Administered 2015-12-21: 2 g via INTRAVENOUS
  Filled 2015-12-21: qty 50

## 2015-12-21 MED ORDER — POTASSIUM CHLORIDE 20 MEQ/15ML (10%) PO SOLN
40.0000 meq | Freq: Once | ORAL | Status: AC
  Administered 2015-12-21: 40 meq
  Filled 2015-12-21: qty 30

## 2015-12-21 MED ORDER — WARFARIN - PHARMACIST DOSING INPATIENT: Freq: Every day | Status: DC

## 2015-12-21 MED ORDER — WARFARIN SODIUM 5 MG PO TABS
5.0000 mg | ORAL_TABLET | Freq: Once | ORAL | Status: AC
  Administered 2015-12-21: 5 mg via ORAL
  Filled 2015-12-21 (×2): qty 1

## 2015-12-21 MED ORDER — FUROSEMIDE 10 MG/ML IJ SOLN
40.0000 mg | Freq: Two times a day (BID) | INTRAMUSCULAR | Status: DC
  Administered 2015-12-21 – 2015-12-23 (×5): 40 mg via INTRAVENOUS
  Filled 2015-12-21 (×5): qty 4

## 2015-12-21 MED ORDER — VITAL AF 1.2 CAL PO LIQD
1000.0000 mL | ORAL | Status: DC
  Administered 2015-12-21: 1000 mL

## 2015-12-21 NOTE — Progress Notes (Addendum)
Initial Nutrition Assessment  DOCUMENTATION CODES:   Not applicable  INTERVENTION:    Initiate TF via OGT with Vital AF 1.2 at goal rate of 60 ml/h (1440 ml per day) to provide 1728 kcals, 108 gm protein, 1168 ml free water daily.  NUTRITION DIAGNOSIS:   Inadequate oral intake related to inability to eat as evidenced by NPO status.  GOAL:   Patient will meet greater than or equal to 90% of their needs   MONITOR:   Vent status, Labs, Weight trends, TF tolerance, I & O's  REASON FOR ASSESSMENT:   Consult Enteral/tube feeding initiation and management  ASSESSMENT:   65 yo male presented with dyspnea, acute encephalopathy, respiratory failure, HTN urgency, acute pulmonary edema, lactic acidosis. Required intubation on admission. Recent hospital admission from 5/20 to 5/24 with UTI, E coli bacteremia, acute on chronic combined CHF, acute pulmonary edema and VDRF. Labs reviewed: phosphorus, magnesium, and potassium are low. Receiving repletion IV. CBG's: [id number] Per discussion with RN, patient has been weaning today. Hopeful for extubation soon. Received MD Consult for TF initiation and management. Patient is currently intubated on ventilator support MV: 11.2 L/min Temp (24hrs), Avg:97.5 F (36.4 C), Min:96.8 F (36 C), Max:98.4 F (36.9 C)   Diet Order:  Diet NPO time specified  Skin:  Reviewed, no issues  Last BM:  unknown  Height:   Ht Readings from Last 1 Encounters:  12/21/15 5\' 6"  (1.676 m)    Weight:   Wt Readings from Last 1 Encounters:  12/21/15 159 lb 9.8 oz (72.4 kg)    Ideal Body Weight:  64.5 kg  BMI:  Body mass index is 25.77 kg/(m^2).  Estimated Nutritional Needs:   Kcal:  1700  Protein:  100-115 gm  Fluid:  1.7 L  EDUCATION NEEDS:   No education needs identified at this time  Joaquin Courts, RD, LDN, CNSC Pager [phone] After Hours Pager [phone]

## 2015-12-21 NOTE — Progress Notes (Signed)
PULMONARY / CRITICAL CARE MEDICINE   Name: Eddie [name] MRN: [id number] DOB: [birth year]    ADMISSION DATE:  12/20/2015  REFERRING MD:  Dr. Deretha Emory  CHIEF COMPLAINT:  SOB  HISTORY OF PRESENT ILLNESS: 65 yo male was in hospital from 5/20 to 5/24 with UTI, E coli bacteremia, acute on chronic combined CHF, acute pulmonary edema and VDRF. CPAP was initiated after sudden onset of shortness of breath and unresponsiveness (5/29). Intubated and placed on Nitro gtt for hypertension (216/138).    SUBJECTIVE: vented, he failed BIPAP and got intubated  VITAL SIGNS: BP 108/76 mmHg  Pulse 73  Temp(Src) 98.1 F (36.7 C) (Axillary)  Resp 20  Ht 5\' 6"  (1.676 m)  Wt 159 lb 9.8 oz (72.4 kg)  BMI 25.77 kg/m2  SpO2 100%  VENTILATOR SETTINGS: Vent Mode:  [-] PRVC FiO2 (%):  [40 %-100 %] 40 % Set Rate:  [14 bmp-26 bmp] 20 bmp Vt Set:  [500 mL-570 mL] 570 mL PEEP:  [5 cmH20] 5 cmH20 Plateau Pressure:  [24 cmH20-33 cmH20] 24 cmH20  INTAKE / OUTPUT: I/O last 3 completed shifts: In: 321.8 [I.V.:321.8] Out: 2495 [Urine:2495]  PHYSICAL EXAMINATION: General: awake, following commands x 4 extremities, nods head appropriately to questions  Neuro: RASS 0 HEENT: ETT in place, mucous membranes moist Cardiac: regular Chest: b/l crackles Abd: soft, non tender Ext: no edema Skin: no rashes  LABS:  BMET  Recent Labs Lab 12/15/15 0330 12/20/15 1924 12/21/15 0653  NA 139 139 139  K 3.6 3.8 2.9*  CL 107 105 103  CO2 25 24 26   BUN 12 7 12   CREATININE 1.05 1.35* 1.15  GLUCOSE 164* 393* 212*    Electrolytes  Recent Labs Lab 12/15/15 0330 12/20/15 1924 12/21/15 0653  CALCIUM 8.3* 8.5* 8.7*  MG  --   --  1.6*  PHOS  --   --  1.9*    CBC  Recent Labs Lab 12/15/15 0330 12/20/15 1924  WBC 7.4 10.9*  HGB 8.8* 9.7*  HCT 28.9* 33.8*  PLT 291 355    Coag's  Recent Labs Lab 12/15/15 0330 12/20/15 2258 12/21/15 0653  INR 1.80* 2.20* 2.19*    Sepsis Markers  Recent  Labs Lab 12/20/15 2113 12/20/15 2256 12/21/15 0653 12/21/15 1036  LATICACIDVEN 4.00*  --   --  1.6  PROCALCITON  --  0.22 1.99  --     ABG  Recent Labs Lab 12/20/15 2106 12/21/15 0235  PHART 7.471* 7.588*  PCO2ART 39.3 28.7*  PO2ART 101.0* 118.0*    Liver Enzymes  Recent Labs Lab 12/20/15 1924 12/21/15 0653  AST 39 23  ALT 23 19  ALKPHOS 112 96  BILITOT 0.5 0.6  ALBUMIN 3.1* 2.7*    Cardiac Enzymes  Recent Labs Lab 12/20/15 2256 12/21/15 0653  TROPONINI 0.05* 0.14*    Glucose  Recent Labs Lab 12/14/15 2056 12/15/15 0642 12/15/15 1142 12/21/15 0034 12/21/15 0051 12/21/15 0503  GLUCAP 194* 167* 207* 418* 379* 342*    Imaging Dg Chest Port 1 View  12/21/2015  CLINICAL DATA:  Respiratory failure. EXAM: PORTABLE CHEST 1 VIEW COMPARISON:  12/20/2015. FINDINGS: Endotracheal tube in stable position. Interim placement of NG tube, its tip is below the left hemidiaphragm. Stable cardiomegaly. Diffuse bilateral airspace disease with slight interim improvement. No pleural effusion or pneumothorax. IMPRESSION: 1. Interim placement NG tube, its tip is below the left hemidiaphragm. Endotracheal tube in stable position. 2. Diffuse bilateral airspace disease with slight interim improvement. Stable cardiomegaly. Electronically Signed  ByMaisie Fus  Register   On: 12/21/2015 06:54   Dg Chest Port 1 View  12/20/2015  CLINICAL DATA:  Endotracheal tube placement, for respiratory distress. Initial encounter. EXAM: PORTABLE CHEST 1 VIEW COMPARISON:  Chest radiograph performed 12/12/2015 FINDINGS: The patient's endotracheal tube is seen ending 4-5 cm above the carina. Fluffy bilateral central airspace opacification may reflect multifocal pneumonia or pulmonary edema. This is new from the prior study. No pleural effusion or pneumothorax is seen. The cardiomediastinal silhouette is borderline enlarged. No acute osseous abnormalities are identified. IMPRESSION: 1. Endotracheal tube  seen ending 4-5 cm above the carina. 2. Fluffy bilateral central airspace opacification may reflect multifocal pneumonia or pulmonary edema. This is new from the prior study. 3. Borderline cardiomegaly. Electronically Signed   By: Roanna Raider M.D.   On: 12/20/2015 18:33     STUDIES:   CULTURES: 5/29 Blood >> NGTD  ANTIBIOTICS: 5/23 Cefpodoxime >> started 5/29 for 3 doses completing previous home regimen 5/29 Vancomycin >> 5/29 5/29 Zosyn >> 5/29  SIGNIFICANT EVENTS: 5/20 to 5/24 with UTI, E coli bacteremia, acute on chronic combined CHF, acute pulmonary edema and VDRF 5/29 Admit, VDRF, CHF  LINES/TUBES: 5/29 ETT >>   DISCUSSION: 65 yo male presented with dyspnea, acute encephalopathy, respiratory failure, HTN urgency, acute pulmonary edema, lactic acidosis. Continue treatment for hypertensive encephalopathy (now resolved). Continue BP management and weaning ventilator for extubation.   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  ASSESSMENT / PLAN:  PULMONARY A: Acute hypoxic respiratory failure 2nd to acute pulmonary edema. P:   Full vent support F/u CXR, ABG - change to PSV Rate reduction -neg balance  CARDIOVASCULAR A:  HTN emergency  Acute on chronic combined CHF ECHO on 5/21 EF 15-20% CAD s/p recent DES. Hx of HLD. P:  ASA, lipitor 80mg , coreg 6.25 mg BID, plavix, lisinopril, aldactone, lasix - BP controlled on Lisinopril and Coreg - previous dose of Lisinopril 20 mg daily --> titrate up as needed  - Continue ASA and Plavix for DES - Continue Aldactone, holding Lasix -some 25% reduction but avoid MAP less 70  RENAL A:   CKD 2. Lactic acidosis Hypokalemia pulm edema P:   Monitor renal fx Replace electrolytes as needed - Potassium 60 meq  - recheck potassium after replacement - Magnesium 2 grams IV  Elevated lactate - most likely due to exacerbation of CHF and  decreased EF - repeat Lactate 1.6  GASTROINTESTINAL A:   Nutrition. P:   NPO - start TF if unable to extubate Protonix for SUP  HEMATOLOGIC A:   Anemia of chronic disease and critical illness. Home coumadin for suspected cardioembolic source for previous CVA ASA and Plavix for DES P:  F/u CBC - WBC slightly elevated (no left shift) - HgB 9.7, transfuse for HgB < 7.0  Lovenox for DVT prophylaxis - DC lovenox (therapeutic on coumadin) - continue to monitor INR and pharmacy to adjust   Continue ASA and Plavix for DES  INFECTIOUS A:   B/l pulmonary infiltrates and lactic acidosis more likely from acute pulmonary edema and hypoxia rather than sepsis. Recent UTI with E coli bacteremia. P:  Pro-calcitonin elevated to 1.99 Complete outpt course of cefpodoxime >> last dose on 5/30  ENDOCRINE A:   DM. P:   Accuchecks Q 6 hours while NPO Holding Metformin 2/2 lactic acidosis Continue to cover with SSI and add  long acting Insulin once nutrition source is stable  NEUROLOGIC A:   Acute metabolic encephalopathy >> resolved. Hx of R MCA CVA March 2017 P:   Coumadin per pharmacy (presumed cardioembolic source for CVA) - Goal INR 2.0-3.0 (2.19 on admission)  Jacqulyn Cane FNP, ACNP-S 12/21/2015, 8:29 AM  STAFF NOTE: I, Rory Percy, MD FACP have personally reviewed patient's available data, including medical history, events of note, physical examination and test results as part of my evaluation. I have discussed with resident/NP and other care providers such as pharmacist, RN and RRT. In addition, I personally evaluated patient and elicited key findings of: awake now, fc, crackels, slight improvement int edema on pcxr, [facility] emergency in setting CHF sys acute , ABg reviewed, when back to rest drop MV br rate, may need Tv reduction also , neg balance goals remain, was neg 2.7 liters and tolerated well, k supp now, lasix scheduled, reduce MAP by 20% but avoid drop MAp less 70,  feed if not extubated keep a form of nitrates, nitropaste add, not impressed active pulm infection, follow pct okay for now, trop neg thus far, SBt weaning aggressive The patient is critically ill with multiple organ systems failure and requires high complexity decision making for assessment and support, frequent evaluation and titration of therapies, application of advanced monitoring technologies and extensive interpretation of multiple databases.   Critical Care Time devoted to patient care services described in this note is 30 Minutes. This time reflects time of care of this signee: Rory Percy, MD FACP. This critical care time does not reflect procedure time, or teaching time or supervisory time of PA/NP/Med student/Med Resident etc but could involve care discussion time. Rest per NP/medical resident whose note is outlined above and that I agree with   Mcarthur Rossetti. Tyson Alias, MD, FACP Pgr: [phone] [facility] 12/21/2015 12:09 PM

## 2015-12-21 NOTE — Progress Notes (Signed)
Lab results of K+ 2.9 and Magnesium 1.6 reported to Anders Simmonds NP

## 2015-12-21 NOTE — Progress Notes (Signed)
ANTICOAGULATION CONSULT NOTE - Initial Consult  Pharmacy Consult for Coumadin Indication: Hx CVA  No Known Allergies  Patient Measurements: Height: 5\' 9"  (175.3 cm) Weight: 162 lb (73.483 kg) IBW/kg (Calculated) : 70.7  Vital Signs: Temp: 97.3 F (36.3 C) (05/29 2300) Temp Source: Axillary (05/29 1747) BP: 151/96 mmHg (05/29 2300) Pulse Rate: 74 (05/29 2300)  Labs:  Recent Labs  12/20/15 1924 12/20/15 2256 12/20/15 2258  HGB 9.7*  --   --   HCT 33.8*  --   --   PLT 355  --   --   LABPROT  --   --  24.3*  INR  --   --  2.20*  CREATININE 1.35*  --   --   TROPONINI  --  0.05*  --     Estimated Creatinine Clearance: 55.3 mL/min (by C-G formula based on Cr of 1.35).   Medical History: Past Medical History  Diagnosis Date  . PUD (peptic ulcer disease)   . CAD (coronary artery disease)     a. STEMI 09/2015 w/ DES to Prox LAD  . CVA (cerebral infarction)     a. 09/2015: acute small right frontal lobe infarct    Medications:  Prescriptions prior to admission  Medication Sig Dispense Refill Last Dose  . aspirin 81 MG chewable tablet Chew 1 tablet (81 mg total) by mouth daily. 100 tablet 0 12/10/2015 at Unknown time  . atorvastatin (LIPITOR) 80 MG tablet Take 1 tablet (80 mg total) by mouth daily at 6 PM. 30 tablet 5 12/10/2015 at Unknown time  . blood glucose meter kit and supplies KIT Dispense based on patient and insurance preference. Use up to four times daily as directed. (FOR ICD-9 250.00, 250.01). 1 each 0 Taking  . carvedilol (COREG) 6.25 MG tablet Take 1 tablet (6.25 mg total) by mouth 2 (two) times daily with a meal. 60 tablet 3 12/10/2015 at 1800  . cefpodoxime (VANTIN) 200 MG tablet Take 1 tablet (200 mg total) by mouth every 12 (twelve) hours. 20 tablet 0   . clopidogrel (PLAVIX) 75 MG tablet Take 4 tablets today then take 1 tablet by mouth daily after that (Patient taking differently: Take 75 mg by mouth daily. Take 4 tablets today then take 1 tablet by mouth  daily after tha) 94 tablet 3 12/10/2015 at Unknown time  . ipratropium-albuterol (DUONEB) 0.5-2.5 (3) MG/3ML SOLN Take 3 mLs by nebulization every 2 (two) hours as needed. 360 mL 5   . lisinopril (PRINIVIL,ZESTRIL) 5 MG tablet Take 5 mg by mouth daily.  3 12/10/2015 at Unknown time  . metFORMIN (GLUCOPHAGE) 850 MG tablet Take 1 tablet (850 mg total) by mouth daily with breakfast. 30 tablet 0 12/10/2015 at Unknown time  . nitroGLYCERIN (NITROSTAT) 0.4 MG SL tablet Use one pill every 5 minutes X 3 if needed for chest pain. Contact MD if pain does not improve (Patient taking differently: Place 0.4 mg under the tongue every 5 (five) minutes as needed for chest pain. Use one pill every 5 minutes X 3 if needed for chest pain. Contact MD if pain does not improve) 30 tablet 0 unknown  . spironolactone (ALDACTONE) 25 MG tablet Take 0.5 tablets (12.5 mg total) by mouth daily. 30 tablet 1 12/10/2015 at Unknown time  . warfarin (COUMADIN) 5 MG tablet Take 1 tablet daily or as directed by coumadin clinic (Patient taking differently: Take 5-7.5 mg by mouth daily. Take 1 1/2 tablet (7.5 mg) by mouth on Monday and Friday, take  1 tablet (5 mg) on Sunday, Tuesday, Wednesday, Thursday, Saturday) 90 tablet 3 12/10/2015 at Unknown time    Assessment: 65 y.o. male admitted with VDRF, Hx empolic CVA, to continue Coumadin  Goal of Therapy:  INR 2-3 Monitor platelets by anticoagulation protocol: Yes   Plan:  F/U daily INR  Eddie [name], Eddie [name] 12/21/2015,12:16 AM

## 2015-12-21 NOTE — Progress Notes (Signed)
Inpatient Diabetes Program Recommendations  AACE/ADA: New Consensus Statement on Inpatient Glycemic Control (2015)  Target Ranges:  Prepandial:   less than 140 mg/dL      Peak postprandial:   less than 180 mg/dL (1-2 hours)      Critically ill patients:  140 - 180 mg/dL   Review of Glycemic Control:  Results for ARMAHN, TRIERWEILER (MRN [id number]) as of 12/21/2015 13:29  Ref. Range 12/21/2015 00:34 12/21/2015 00:51 12/21/2015 05:03 12/21/2015 08:35 12/21/2015 11:26  Glucose-Capillary Latest Ref Range: 65-99 mg/dL 951 (H) 884 (H) 166 (H) 190 (H) 122 (H)   Diabetes history:  Type 2 diabetes Outpatient Diabetes medications: Metformin 850 mg daily Current orders for Inpatient glycemic control:  Novolog moderate q 4 hours  Inpatient Diabetes Program Recommendations:    Please consider d/c of current Novolog correction and change to ICU glycemic control order set with standard Novolog correction.    Thanks, Beryl Meager, RN, BC-ADM Inpatient Diabetes Coordinator Pager [phone] (8a-5p)

## 2015-12-22 ENCOUNTER — Inpatient Hospital Stay (HOSPITAL_COMMUNITY): Payer: BLUE CROSS/BLUE SHIELD

## 2015-12-22 LAB — COMPREHENSIVE METABOLIC PANEL
ALT: 18 U/L (ref 17–63)
AST: 18 U/L (ref 15–41)
Albumin: 2.8 g/dL — ABNORMAL LOW (ref 3.5–5.0)
Alkaline Phosphatase: 89 U/L (ref 38–126)
Anion gap: 9 (ref 5–15)
BUN: 13 mg/dL (ref 6–20)
CHLORIDE: 102 mmol/L (ref 101–111)
CO2: 29 mmol/L (ref 22–32)
CREATININE: 1.28 mg/dL — AB (ref 0.61–1.24)
Calcium: 8.7 mg/dL — ABNORMAL LOW (ref 8.9–10.3)
GFR calc non Af Amer: 58 mL/min — ABNORMAL LOW (ref >60)
Glucose, Bld: 194 mg/dL — ABNORMAL HIGH (ref 65–99)
Potassium: 3.5 mmol/L (ref 3.5–5.1)
SODIUM: 140 mmol/L (ref 135–145)
Total Bilirubin: 0.6 mg/dL (ref 0.3–1.2)
Total Protein: 7.2 g/dL (ref 6.5–8.1)

## 2015-12-22 LAB — GLUCOSE, CAPILLARY
GLUCOSE-CAPILLARY: 243 mg/dL — AB (ref 65–99)
GLUCOSE-CAPILLARY: 165 mg/dL — AB (ref 65–99)
GLUCOSE-CAPILLARY: 66 mg/dL (ref 65–99)
Glucose-Capillary: 155 mg/dL — ABNORMAL HIGH (ref 65–99)
Glucose-Capillary: 217 mg/dL — ABNORMAL HIGH (ref 65–99)
Glucose-Capillary: 253 mg/dL — ABNORMAL HIGH (ref 65–99)

## 2015-12-22 LAB — URINE CULTURE

## 2015-12-22 LAB — CBC WITH DIFFERENTIAL/PLATELET
BASOS ABS: 0 10*3/uL (ref 0.0–0.1)
Basophils Relative: 0 %
EOS ABS: 0.1 10*3/uL (ref 0.0–0.7)
Eosinophils Relative: 1 %
HEMATOCRIT: 31.1 % — AB (ref 39.0–52.0)
Hemoglobin: 9.3 g/dL — ABNORMAL LOW (ref 13.0–17.0)
LYMPHS ABS: 2.1 10*3/uL (ref 0.7–4.0)
LYMPHS PCT: 15 %
MCH: 24.6 pg — AB (ref 26.0–34.0)
MCHC: 29.9 g/dL — AB (ref 30.0–36.0)
MCV: 82.3 fL (ref 78.0–100.0)
MONOS PCT: 3 %
Monocytes Absolute: 0.4 10*3/uL (ref 0.1–1.0)
NEUTROS ABS: 11.2 10*3/uL — AB (ref 1.7–7.7)
NEUTROS PCT: 81 %
Platelets: 334 10*3/uL (ref 150–400)
RBC: 3.78 MIL/uL — ABNORMAL LOW (ref 4.22–5.81)
RDW: 16.8 % — AB (ref 11.5–15.5)
WBC: 13.8 10*3/uL — ABNORMAL HIGH (ref 4.0–10.5)

## 2015-12-22 LAB — PROCALCITONIN: Procalcitonin: 1.96 ng/mL

## 2015-12-22 LAB — PROTIME-INR
INR: 2.21 — AB (ref 0.00–1.49)
Prothrombin Time: 24.3 seconds — ABNORMAL HIGH (ref 11.6–15.2)

## 2015-12-22 LAB — PHOSPHORUS: Phosphorus: 4.2 mg/dL (ref 2.5–4.6)

## 2015-12-22 MED ORDER — SPIRONOLACTONE 25 MG PO TABS
12.5000 mg | ORAL_TABLET | Freq: Every day | ORAL | Status: DC
  Administered 2015-12-23 – 2015-12-24 (×2): 12.5 mg via ORAL
  Filled 2015-12-22: qty 1
  Filled 2015-12-22: qty 0.5

## 2015-12-22 MED ORDER — CLOPIDOGREL BISULFATE 75 MG PO TABS
75.0000 mg | ORAL_TABLET | Freq: Every day | ORAL | Status: DC
  Administered 2015-12-22 – 2015-12-24 (×3): 75 mg via ORAL
  Filled 2015-12-22 (×3): qty 1

## 2015-12-22 MED ORDER — LISINOPRIL 5 MG PO TABS
5.0000 mg | ORAL_TABLET | Freq: Every day | ORAL | Status: DC
  Administered 2015-12-22 – 2015-12-24 (×3): 5 mg via ORAL
  Filled 2015-12-22 (×3): qty 1

## 2015-12-22 MED ORDER — LISINOPRIL 5 MG PO TABS: 5.0000 mg | ORAL_TABLET | Freq: Every day | ORAL | Status: DC

## 2015-12-22 MED ORDER — ATORVASTATIN CALCIUM 80 MG PO TABS
80.0000 mg | ORAL_TABLET | Freq: Every day | ORAL | Status: DC
  Administered 2015-12-22 – 2015-12-23 (×2): 80 mg via ORAL
  Filled 2015-12-22 (×2): qty 1

## 2015-12-22 MED ORDER — CARVEDILOL 6.25 MG PO TABS
6.2500 mg | ORAL_TABLET | Freq: Two times a day (BID) | ORAL | Status: DC
  Administered 2015-12-22 – 2015-12-24 (×4): 6.25 mg via ORAL
  Filled 2015-12-22 (×4): qty 1

## 2015-12-22 MED ORDER — CETYLPYRIDINIUM CHLORIDE 0.05 % MT LIQD
7.0000 mL | Freq: Two times a day (BID) | OROMUCOSAL | Status: DC
  Administered 2015-12-23: 7 mL via OROMUCOSAL

## 2015-12-22 MED ORDER — PANTOPRAZOLE SODIUM 40 MG PO TBEC
40.0000 mg | DELAYED_RELEASE_TABLET | Freq: Every day | ORAL | Status: DC
  Administered 2015-12-22 – 2015-12-23 (×2): 40 mg via ORAL
  Filled 2015-12-22 (×2): qty 1

## 2015-12-22 MED ORDER — WARFARIN SODIUM 5 MG PO TABS
5.0000 mg | ORAL_TABLET | Freq: Once | ORAL | Status: AC
  Administered 2015-12-22: 5 mg via ORAL
  Filled 2015-12-22: qty 1

## 2015-12-22 MED ORDER — ASPIRIN EC 81 MG PO TBEC
81.0000 mg | DELAYED_RELEASE_TABLET | Freq: Every day | ORAL | Status: DC
  Administered 2015-12-22 – 2015-12-24 (×3): 81 mg via ORAL
  Filled 2015-12-22 (×3): qty 1

## 2015-12-22 MED ORDER — CLOPIDOGREL BISULFATE 75 MG PO TABS: 75.0000 mg | ORAL_TABLET | Freq: Every day | ORAL | Status: DC

## 2015-12-22 NOTE — Procedures (Signed)
Extubation Procedure Note  Patient Details:   Name: Eddie [name] DOB: 1950-09-15 MRN: [id number]   Airway Documentation:     Evaluation  O2 sats: stable throughout Complications: No apparent complications Patient did tolerate procedure well. Bilateral Breath Sounds: Clear   Yes  PT was extubated to 3L [place]  PT was able to say his name and cough  Sats are stable  Cj Beecher, Duane Lope 12/22/2015, 9:17 AM

## 2015-12-22 NOTE — Progress Notes (Signed)
ANTICOAGULATION CONSULT NOTE - Follow Up Consult  Pharmacy Consult for Coumadin Indication: Hx CVA  No Known Allergies  Patient Measurements: Height: 5\' 6"  (167.6 cm) Weight: 158 lb 8.2 oz (71.9 kg) IBW/kg (Calculated) : 63.8  Vital Signs: Temp: 98.2 F (36.8 C) (05/31 1234) Temp Source: Oral (05/31 1234) BP: 128/88 mmHg (05/31 1300) Pulse Rate: 77 (05/31 1300)  Labs:  Recent Labs  12/20/15 1924 12/20/15 2256 12/20/15 2258 12/21/15 0653 12/21/15 1036 12/21/15 1412 12/22/15 0506  HGB 9.7*  --   --   --   --   --  9.3*  HCT 33.8*  --   --   --   --   --  31.1*  PLT 355  --   --   --   --   --  334  LABPROT  --   --  24.3* 24.2*  --   --  24.3*  INR  --   --  2.20* 2.19*  --   --  2.21*  CREATININE 1.35*  --   --  1.15  --  1.16 1.28*  TROPONINI  --  0.05*  --  0.14* 0.14*  --   --     Estimated Creatinine Clearance: 52.6 mL/min (by C-G formula based on Cr of 1.28).   Assessment: 65 year old male admitted with VDRF now extubated and hx embolic CVA continuing on home Coumadin. INR today is therapeutic at 2.21. Received Coumadin dose last night.  Home regimen was Coumadin 7.5mg  Monday and Fridays and 5 mg all other days. H/H low stable. Platelets are within normal limits. No bleeding noted.   Goal of Therapy:  INR 2-3 Monitor platelets by anticoagulation protocol: Yes   Plan:  Coumadin 5mg  po x1 tonight per home dosing regimen.  Monitor PT/INR.  Monitor for signs and symptoms of bleeding.   Link Snuffer, PharmD, BCPS Clinical Pharmacist [phone]  12/22/2015,2:16 PM

## 2015-12-22 NOTE — Progress Notes (Signed)
PULMONARY / CRITICAL CARE MEDICINE   Name: Eddie [name] MRN: [id number] DOB: July 21, 1951    ADMISSION DATE:  12/20/2015  REFERRING MD:  Dr. Deretha Emory  CHIEF COMPLAINT:  SOB  HISTORY OF PRESENT ILLNESS: 65 yo male was in hospital from 5/20 to 5/24 with UTI, E coli bacteremia, acute on chronic combined CHF, acute pulmonary edema and VDRF. CPAP was initiated after sudden onset of shortness of breath and unresponsiveness (5/29). Intubated and placed on Nitro gtt for hypertension (216/138).    SUBJECTIVE: vented, he failed BIPAP and got intubated  VITAL SIGNS: BP 135/84 mmHg  Pulse 85  Temp(Src) 98.6 F (37 C) (Axillary)  Resp 24  Ht 5\' 6"  (1.676 m)  Wt 158 lb 8.2 oz (71.9 kg)  BMI 25.60 kg/m2  SpO2 100%  VENTILATOR SETTINGS: Vent Mode:  [-] CPAP;PSV FiO2 (%):  [40 %] 40 % Set Rate:  [10 bmp-20 bmp] 10 bmp Vt Set:  [570 mL] 570 mL PEEP:  [5 cmH20] 5 cmH20 Pressure Support:  [10 cmH20-15 cmH20] 10 cmH20 Plateau Pressure:  [21 cmH20-24 cmH20] 21 cmH20  INTAKE / OUTPUT: I/O last 3 completed shifts: In: 2104.3 [I.V.:954.3; NG/GT:900; IV Piggyback:250] Out: 6350 [Urine:6350]  PHYSICAL EXAMINATION: General: awake, following commands x 4 extremities, nods head appropriately to questions  Neuro: RASS 0 HEENT: ETT in place, mucous membranes moist Cardiac: regular Chest: clear to auscultation Abd: soft, non tender Ext: no edema Skin: no rashes  LABS:  BMET  Recent Labs Lab 12/21/15 0653 12/21/15 1412 12/22/15 0506  NA 139 142 140  K 2.9* 5.0 3.5  CL 103 105 102  CO2 26 28 29   BUN 12 10 13   CREATININE 1.15 1.16 1.28*  GLUCOSE 212* 123* 194*    Electrolytes  Recent Labs Lab 12/21/15 0653 12/21/15 1412 12/22/15 0506  CALCIUM 8.7* 9.0 8.7*  MG 1.6*  --   --   PHOS 1.9* 3.0 4.2    CBC  Recent Labs Lab 12/20/15 1924 12/22/15 0506  WBC 10.9* 13.8*  HGB 9.7* 9.3*  HCT 33.8* 31.1*  PLT 355 334    Coag's  Recent Labs Lab 12/20/15 2258  12/21/15 0653 12/22/15 0506  INR 2.20* 2.19* 2.21*    Sepsis Markers  Recent Labs Lab 12/20/15 2113 12/20/15 2256 12/21/15 0653 12/21/15 1036 12/22/15 0506  LATICACIDVEN 4.00*  --   --  1.6  --   PROCALCITON  --  0.22 1.99  --  1.96    ABG  Recent Labs Lab 12/20/15 2106 12/21/15 0235  PHART 7.471* 7.588*  PCO2ART 39.3 28.7*  PO2ART 101.0* 118.0*    Liver Enzymes  Recent Labs Lab 12/20/15 1924 12/21/15 0653 12/22/15 0506  AST 39 23 18  ALT 23 19 18   ALKPHOS 112 96 89  BILITOT 0.5 0.6 0.6  ALBUMIN 3.1* 2.7* 2.8*    Cardiac Enzymes  Recent Labs Lab 12/20/15 2256 12/21/15 0653 12/21/15 1036  TROPONINI 0.05* 0.14* 0.14*    Glucose  Recent Labs Lab 12/21/15 0835 12/21/15 1126 12/21/15 1522 12/21/15 1932 12/21/15 2349 12/22/15 0347  GLUCAP 190* 122* 137* 151* 155* 217*    Imaging Dg Chest Port 1 View  12/22/2015  CLINICAL DATA:  Pulmonary edema, hypertension, intubated patient. EXAM: PORTABLE CHEST 1 VIEW COMPARISON:  Portable chest x-ray of Dec 21, 2015 FINDINGS: The lungs are adequately inflated and clear. The heart is mildly enlarged but stable. The pulmonary vascularity is not engorged. The endotracheal tube tip lies 4.2 cm above the carina. The esophagogastric  tube tip projects below the inferior margin of the image. The bony thorax exhibits no acute abnormality. IMPRESSION: Interval resolution of pulmonary interstitial edema. Stable mild cardiomegaly. The support tubes are in stable position. Electronically Signed   By: David  [place] M.D.   On: 12/22/2015 07:38   Dg Abd Portable 1v  12/21/2015  CLINICAL DATA:  Nasogastric tube placement EXAM: PORTABLE ABDOMEN - 1 VIEW COMPARISON:  February 03, 2008 FINDINGS: Nasogastric tube tip and side port are in the stomach. There is moderate stool throughout the colon. There is no bowel dilatation or air-fluid level suggesting obstruction. No free air. Rectal thermometer is evident. There is extensive prostatic  calcification. Lung bases are clear. IMPRESSION: Nasogastric tube tip and side port in stomach. Bowel gas pattern unremarkable. No demonstrable free air or bowel obstruction. Electronically Signed   By: Bretta Bang III M.D.   On: 12/21/2015 16:51     STUDIES:   CULTURES: 5/29 Blood >> NGTD  ANTIBIOTICS: 5/23 Cefpodoxime >> started 5/29 for 3 doses completing previous home regimen 5/29 Vancomycin >> 5/29 5/29 Zosyn >> 5/29  SIGNIFICANT EVENTS: 5/20 to 5/24 with UTI, E coli bacteremia, acute on chronic combined CHF, acute pulmonary edema and VDRF 5/29 Admit, VDRF, CHF  LINES/TUBES: 5/29 ETT >>   DISCUSSION: 65 yo male presented with dyspnea, acute encephalopathy, respiratory failure, HTN urgency, acute pulmonary edema, lactic acidosis. Continue treatment for hypertensive encephalopathy (now resolved). Continue BP management and weaning ventilator for extubation.   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  ASSESSMENT / PLAN:  PULMONARY A: Acute hypoxic respiratory failure 2nd to acute pulmonary edema. P:   PSV 40% +5/10 CXR improved today, pulmonary edema resolved -neg balance ---> negative 2.1 liters for past 24 hours  CARDIOVASCULAR A:  HTN emergency  Acute on chronic combined CHF ECHO on 5/21 EF 15-20% CAD s/p recent DES. Hx of HLD. P:  ASA, lipitor 80mg , coreg 6.25 mg BID, plavix, lisinopril, aldactone, lasix - BP controlled on Lisinopril and Coreg - previous dose of Lisinopril 20 mg daily --> titrate up as needed   - BP remain < 140 on current regimen---> no change at this time - Continue ASA and Plavix for DES - Continue Aldactone, holding Lasix -some 25% reduction but avoid MAP less 70  RENAL A:   CKD 2. Lactic acidosis Hypokalemia pulm edema P:   Monitor renal fx Replace electrolytes as needed Elevated lactate - most likely due to exacerbation of CHF and  decreased EF - repeat Lactate 1.6 Lasix 40 mg BID started 5/30  - slight increase in creatinine.   GASTROINTESTINAL A:   Nutrition. P:   NPO - start TF if unable to extubate Protonix for SUP  HEMATOLOGIC A:   Anemia of chronic disease and critical illness. Home coumadin for suspected cardioembolic source for previous CVA ASA and Plavix for DES P:  F/u CBC - HgB 9.7, transfuse for HgB < 7.0  Lovenox for DVT prophylaxis - DC lovenox (therapeutic on coumadin) - continue to monitor INR and pharmacy to adjust   Continue ASA and Plavix for DES  INFECTIOUS A:   B/l pulmonary infiltrates and lactic acidosis more likely from acute pulmonary edema and hypoxia rather than sepsis. Recent UTI with E coli bacteremia. P:  Pro-calcitonin decreased to 1.96---> continue to monitor per protocol Complete outpt course of cefpodoxime >> last dose on 5/30  WBC continue to elevate  - remains afebrile will reculture if spikes temperature  ENDOCRINE A:   DM. P:   Accuchecks Q 6 hours while NPO Holding Metformin 2/2 lactic acidosis Continue to cover with SSI and add long acting Insulin once nutrition source is stable  NEUROLOGIC A:   Acute metabolic encephalopathy >> resolved. Hx of R MCA CVA March 2017 P:   Coumadin per pharmacy (presumed cardioembolic source for CVA) - Goal INR 2.0-3.0 (2.19 on admission)  Jacqulyn Cane FNP, ACNP-S 12/22/2015, 8:30 AM  STAFF NOTE: I, Rory Percy, MD FACP have personally reviewed patient's available data, including medical history, events of note, physical examination and test results as part of my evaluation. I have discussed with resident/NP and other care providers such as pharmacist, RN and RRT. In addition, I personally evaluated patient and elicited key findings of: awake, legs crossed, no distress, excelelnt neurostatus, pcxr resolving edema, lasix to neg balance successfuil to the tune of 2.1 liters, maintain but reduce lasix, reduce PS to  5 cpap 5, goal 30 min , assess rsbi, k bmet in am, upright, no role ABX, pct flat, pcxr rsolving not c/w PNA, hold TF The patient is critically ill with multiple organ systems failure and requires high complexity decision making for assessment and support, frequent evaluation and titration of therapies, application of advanced monitoring technologies and extensive interpretation of multiple databases.   Critical Care Time devoted to patient care services described in this note is 30 Minutes. This time reflects time of care of this signee: Rory Percy, MD FACP. This critical care time does not reflect procedure time, or teaching time or supervisory time of PA/NP/Med student/Med Resident etc but could involve care discussion time. Rest per NP/medical resident whose note is outlined above and that I agree with   Mcarthur Rossetti. Tyson Alias, MD, FACP Pgr: [phone] [facility] 12/22/2015 9:00 AM

## 2015-12-22 NOTE — Progress Notes (Signed)
Fentanyl 200cc's  Wasted witnessed by Julius Bowels, RN and Eddie North, RN

## 2015-12-23 ENCOUNTER — Ambulatory Visit: Payer: BLUE CROSS/BLUE SHIELD

## 2015-12-23 DIAGNOSIS — R262 Difficulty in walking, not elsewhere classified: Secondary | ICD-10-CM | POA: Insufficient documentation

## 2015-12-23 DIAGNOSIS — R2681 Unsteadiness on feet: Secondary | ICD-10-CM | POA: Insufficient documentation

## 2015-12-23 DIAGNOSIS — R2689 Other abnormalities of gait and mobility: Secondary | ICD-10-CM | POA: Insufficient documentation

## 2015-12-23 DIAGNOSIS — W19XXXD Unspecified fall, subsequent encounter: Secondary | ICD-10-CM | POA: Insufficient documentation

## 2015-12-23 DIAGNOSIS — R498 Other voice and resonance disorders: Secondary | ICD-10-CM | POA: Insufficient documentation

## 2015-12-23 DIAGNOSIS — I69354 Hemiplegia and hemiparesis following cerebral infarction affecting left non-dominant side: Secondary | ICD-10-CM | POA: Insufficient documentation

## 2015-12-23 DIAGNOSIS — R41841 Cognitive communication deficit: Secondary | ICD-10-CM | POA: Insufficient documentation

## 2015-12-23 DIAGNOSIS — R1313 Dysphagia, pharyngeal phase: Secondary | ICD-10-CM | POA: Insufficient documentation

## 2015-12-23 LAB — GLUCOSE, CAPILLARY
GLUCOSE-CAPILLARY: 66 mg/dL (ref 65–99)
GLUCOSE-CAPILLARY: 93 mg/dL (ref 65–99)
GLUCOSE-CAPILLARY: 313 mg/dL — AB (ref 65–99)
GLUCOSE-CAPILLARY: 253 mg/dL — AB (ref 65–99)
Glucose-Capillary: 203 mg/dL — ABNORMAL HIGH (ref 65–99)
Glucose-Capillary: 183 mg/dL — ABNORMAL HIGH (ref 65–99)
Glucose-Capillary: 320 mg/dL — ABNORMAL HIGH (ref 65–99)

## 2015-12-23 LAB — PROTIME-INR
INR: 1.94 — ABNORMAL HIGH (ref 0.00–1.49)
PROTHROMBIN TIME: 22.1 s — AB (ref 11.6–15.2)

## 2015-12-23 MED ORDER — WARFARIN SODIUM 5 MG PO TABS
5.0000 mg | ORAL_TABLET | Freq: Once | ORAL | Status: AC
  Administered 2015-12-23: 5 mg via ORAL
  Filled 2015-12-23: qty 1

## 2015-12-23 MED ORDER — FUROSEMIDE 40 MG PO TABS
40.0000 mg | ORAL_TABLET | Freq: Every day | ORAL | Status: DC
  Administered 2015-12-24: 40 mg via ORAL
  Filled 2015-12-23 (×2): qty 1

## 2015-12-23 MED ORDER — MAGNESIUM SULFATE 2 GM/50ML IV SOLN
2.0000 g | Freq: Once | INTRAVENOUS | Status: AC
  Administered 2015-12-23: 2 g via INTRAVENOUS
  Filled 2015-12-23: qty 50

## 2015-12-23 MED ORDER — POTASSIUM CHLORIDE 20 MEQ/15ML (10%) PO SOLN
40.0000 meq | Freq: Once | ORAL | Status: AC
  Administered 2015-12-23: 40 meq via ORAL
  Filled 2015-12-23: qty 30

## 2015-12-23 MED ORDER — INSULIN GLARGINE 100 UNIT/ML ~~LOC~~ SOLN
10.0000 [IU] | Freq: Every day | SUBCUTANEOUS | Status: DC
  Administered 2015-12-23 – 2015-12-24 (×2): 10 [IU] via SUBCUTANEOUS
  Filled 2015-12-23 (×2): qty 0.1

## 2015-12-23 NOTE — Progress Notes (Signed)
ANTICOAGULATION CONSULT NOTE - Follow Up Consult  Pharmacy Consult for Coumadin Indication: Hx CVA  No Known Allergies  Patient Measurements: Height: 5\' 6"  (167.6 cm) Weight: 158 lb 8.2 oz (71.9 kg) IBW/kg (Calculated) : 63.8  Vital Signs: Temp: 99 F (37.2 C) (06/01 0819) Temp Source: Oral (06/01 0819) BP: 130/80 mmHg (06/01 0700) Pulse Rate: 80 (06/01 0700)  Labs:  Recent Labs  12/20/15 1924 12/20/15 2256  12/21/15 0653 12/21/15 1036 12/21/15 1412 12/22/15 0506 12/23/15 0330  HGB 9.7*  --   --   --   --   --  9.3*  --   HCT 33.8*  --   --   --   --   --  31.1*  --   PLT 355  --   --   --   --   --  334  --   LABPROT  --   --   < > 24.2*  --   --  24.3* 22.1*  INR  --   --   < > 2.19*  --   --  2.21* 1.94*  CREATININE 1.35*  --   --  1.15  --  1.16 1.28*  --   TROPONINI  --  0.05*  --  0.14* 0.14*  --   --   --   < > = values in this interval not displayed.  Estimated Creatinine Clearance: 52.6 mL/min (by C-G formula based on Cr of 1.28).   Assessment: 65 year old male admitted with VDRF now extubated and hx embolic CVA continuing on home Coumadin.   INR 1.94 slightly subtherapeutic today. Home regimen Coumadin 7.5mg  Monday and Fridays and 5 mg all other days. Last H/H low stable. Platelets are within normal limits. No bleeding noted.   Goal of Therapy:  INR 2-3 Monitor platelets by anticoagulation protocol: Yes   Plan:  Coumadin 5mg  po x1 tonight per home dosing regimen.  Daily PT/INR.  Monitor for signs and symptoms of bleeding.    Hillery Aldo, [place].D., BCPS PGY2 Cardiology Pharmacy Resident Pager: [phone]   12/23/2015,9:35 AM

## 2015-12-23 NOTE — Progress Notes (Signed)
Inpatient Diabetes Program Recommendations  AACE/ADA: New Consensus Statement on Inpatient Glycemic Control (2015)  Target Ranges:  Prepandial:   less than 140 mg/dL      Peak postprandial:   less than 180 mg/dL (1-2 hours)      Critically ill patients:  140 - 180 mg/dL   Lab Results  Component Value Date   GLUCAP 183* 12/23/2015   HGBA1C 9.6* 12/11/2015    Review of Glycemic Control:  Results for ZED, KANALEY (MRN [id number]) as of 12/23/2015 10:18  Ref. Range 12/22/2015 03:47 12/22/2015 08:44 12/22/2015 11:40 12/22/2015 16:00 12/22/2015 19:42 12/22/2015 23:13 12/23/2015 03:32 12/23/2015 04:17 12/23/2015 08:16  Glucose-Capillary Latest Ref Range: 65-99 mg/dL 169 (H) 450 (H) 388 (H) 66 253 (H) 203 (H) 66 93 183 (H)   Diabetes history: Type 2 diabetes Outpatient Diabetes medications: Metformin 850 mg daily Current orders for Inpatient glycemic control:  Novolog moderate q 4 hours  Inpatient Diabetes Program Recommendations:    Please consider reducing Novolog to tid with meals and HS.  Also consider changing diet to CHO modified diet.  Thanks, Beryl Meager, RN, BC-ADM Inpatient Diabetes Coordinator Pager [phone] (8a-5p)

## 2015-12-23 NOTE — Progress Notes (Signed)
PULMONARY / CRITICAL CARE MEDICINE   Name: Eddie [name] MRN: [id number] DOB: Dec 06, 1950    ADMISSION DATE:  12/20/2015  REFERRING MD:  Dr. Deretha Emory  CHIEF COMPLAINT:  SOB  HISTORY OF PRESENT ILLNESS: 65 yo male was in hospital from 5/20 to 5/24 with UTI, E coli bacteremia, acute on chronic combined CHF, acute pulmonary edema and VDRF. CPAP was initiated after sudden onset of shortness of breath and unresponsiveness (5/29). Intubated and placed on Nitro gtt for hypertension (216/138).    SUBJECTIVE:  Wants ice cream  VITAL SIGNS: BP 130/80 mmHg  Pulse 80  Temp(Src) 99 F (37.2 C) (Oral)  Resp 18  Ht 5\' 6"  (1.676 m)  Wt 158 lb 8.2 oz (71.9 kg)  BMI 25.60 kg/m2  SpO2 100%  VENTILATOR SETTINGS:    INTAKE / OUTPUT: I/O last 3 completed shifts: In: 1410 [P.O.:360; I.V.:390; NG/GT:660] Out: 5700 [Urine:5700]  PHYSICAL EXAMINATION: General: awake, following commands x 4 extremities Neuro: RASS 0, no focal def  HEENT:MMM no JVD  Cardiac: regular; rrr Chest: clear to auscultation Abd: soft, non tender Ext: no edema Skin: no rashes  LABS:  BMET  Recent Labs Lab 12/21/15 0653 12/21/15 1412 12/22/15 0506  NA 139 142 140  K 2.9* 5.0 3.5  CL 103 105 102  CO2 26 28 29   BUN 12 10 13   CREATININE 1.15 1.16 1.28*  GLUCOSE 212* 123* 194*    Electrolytes  Recent Labs Lab 12/21/15 0653 12/21/15 1412 12/22/15 0506  CALCIUM 8.7* 9.0 8.7*  MG 1.6*  --   --   PHOS 1.9* 3.0 4.2    CBC  Recent Labs Lab 12/20/15 1924 12/22/15 0506  WBC 10.9* 13.8*  HGB 9.7* 9.3*  HCT 33.8* 31.1*  PLT 355 334    Coag's  Recent Labs Lab 12/21/15 0653 12/22/15 0506 12/23/15 0330  INR 2.19* 2.21* 1.94*    Sepsis Markers  Recent Labs Lab 12/20/15 2113 12/20/15 2256 12/21/15 0653 12/21/15 1036 12/22/15 0506  LATICACIDVEN 4.00*  --   --  1.6  --   PROCALCITON  --  0.22 1.99  --  1.96    ABG  Recent Labs Lab 12/20/15 2106 12/21/15 0235  PHART 7.471*  7.588*  PCO2ART 39.3 28.7*  PO2ART 101.0* 118.0*    Liver Enzymes  Recent Labs Lab 12/20/15 1924 12/21/15 0653 12/22/15 0506  AST 39 23 18  ALT 23 19 18   ALKPHOS 112 96 89  BILITOT 0.5 0.6 0.6  ALBUMIN 3.1* 2.7* 2.8*    Cardiac Enzymes  Recent Labs Lab 12/20/15 2256 12/21/15 0653 12/21/15 1036  TROPONINI 0.05* 0.14* 0.14*    Glucose  Recent Labs Lab 12/22/15 1600 12/22/15 1942 12/22/15 2313 12/23/15 0332 12/23/15 0417 12/23/15 0816  GLUCAP 66 253* 203* 66 93 183*    Imaging No results found.   STUDIES:   CULTURES: 5/29 Blood >> NGTD  ANTIBIOTICS: 5/23 Cefpodoxime >> started 5/29 for 3 doses completing previous home regimen 5/29 Vancomycin >> 5/29 5/29 Zosyn >> 5/29  SIGNIFICANT EVENTS: 5/20 to 5/24 with UTI, E coli bacteremia, acute on chronic combined CHF, acute pulmonary edema and VDRF 5/29 Admit, VDRF, CHF 5/31 extubated 6/1 looks better. Adjusting antihypertensives.   LINES/TUBES: 5/29 ETT >> 6/1  DISCUSSION: 65 yo male presented with dyspnea, acute encephalopathy, respiratory failure, HTN urgency, acute pulmonary edema, lactic acidosis. Off vent. Hemodynamically stable. Weaning oxygen. 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  ASSESSMENT / PLAN:  PULMONARY A: Acute hypoxic respiratory failure 2nd to acute pulmonary edema-->resovled  P:   Wean FIO2 Mobilize Keep even at this point  CARDIOVASCULAR A:  HTN emergency  Acute on chronic combined CHF ECHO on 5/21 EF 15-20% CAD s/p recent DES. Hx of HLD. P:  ASA, lipitor , coreg 6.25 mg BID, plavix, lisinopril, aldactone, lasix - BP controlled on Lisinopril and Coreg - previous dose of Lisinopril 20 mg daily --> titrate up as needed   - BP remain < 140 on current regimen---> no change at this time - Continue ASA and Plavix for DES - Continue Aldactone -some 25%  reduction but avoid MAP less 70  RENAL A:   CKD 2. Lactic acidosis -->d/t respiratory failure (resolved) Hypokalemia P:   Monitor renal fx Replace electrolytes as needed Lasix 40 mg BID started 5/30  GASTROINTESTINAL A:   Nutrition. P:   Diet as tolerated Protonix for SUP- dc  HEMATOLOGIC A:   Anemia of chronic disease and critical illness. Home coumadin for suspected cardioembolic source for previous CVA ASA and Plavix for DES P:  Cont coumadin continue to monitor INR and pharmacy to adjust  Continue ASA and Plavix for DES  INFECTIOUS A:   B/l pulmonary infiltrates and lactic acidosis more likely from acute pulmonary edema and hypoxia rather than sepsis. Recent UTI with E coli bacteremia. P:  Completed outpt course of cefpodoxime >> last dose on 5/30 remains afebrile will reculture if spikes temperature  ENDOCRINE A:   DM. P:   Accuchecks Q 6 hours while NPO Holding Metformin 2/2 lactic acidosis Continue to cover with SSI and add long acting Insulin   NEUROLOGIC A:   Acute metabolic encephalopathy >> resolved. Hx of R MCA CVA March 2017 P:   Coumadin per pharmacy (presumed cardioembolic source for CVA) - Goal INR 2.0-3.0 (2.19 on admission)  Simonne Martinet ACNP-BC [facility] Pager # [phone] OR # [phone] if no answer  12/23/2015, 12:01 PM     STAFF NOTE: I, Rory Percy, MD FACP have personally reviewed patient's available data, including medical history, events of note, physical examination and test results as part of my evaluation. I have discussed with resident/NP and other care providers such as pharmacist, RN and RRT. In addition, I personally evaluated patient and elicited key findings of: extubated day prior, doing well, no distress, neg 3 liters again, lasix to reduce to oral, chem i am , controlling afterload, no infection present,, no foley, no line, to triad,    Mcarthur Rossetti. Tyson Alias, MD, FACP Pgr:  [phone] [place] [facility] 12/23/2015 12:35 PM

## 2015-12-24 LAB — BASIC METABOLIC PANEL
ANION GAP: 10 (ref 5–15)
BUN: 31 mg/dL — ABNORMAL HIGH (ref 6–20)
CHLORIDE: 100 mmol/L — AB (ref 101–111)
CO2: 29 mmol/L (ref 22–32)
CREATININE: 1.36 mg/dL — AB (ref 0.61–1.24)
Calcium: 8.5 mg/dL — ABNORMAL LOW (ref 8.9–10.3)
GFR calc non Af Amer: 53 mL/min — ABNORMAL LOW (ref >60)
Glucose, Bld: 50 mg/dL — ABNORMAL LOW (ref 65–99)
POTASSIUM: 3.9 mmol/L (ref 3.5–5.1)
Sodium: 139 mmol/L (ref 135–145)

## 2015-12-24 LAB — CBC
HEMATOCRIT: 30.3 % — AB (ref 39.0–52.0)
HEMOGLOBIN: 8.8 g/dL — AB (ref 13.0–17.0)
MCH: 24.8 pg — ABNORMAL LOW (ref 26.0–34.0)
MCHC: 29 g/dL — AB (ref 30.0–36.0)
MCV: 85.4 fL (ref 78.0–100.0)
Platelets: 314 10*3/uL (ref 150–400)
RBC: 3.55 MIL/uL — AB (ref 4.22–5.81)
RDW: 16.4 % — ABNORMAL HIGH (ref 11.5–15.5)
WBC: 9.5 10*3/uL (ref 4.0–10.5)

## 2015-12-24 LAB — GLUCOSE, CAPILLARY
GLUCOSE-CAPILLARY: 175 mg/dL — AB (ref 65–99)
GLUCOSE-CAPILLARY: 181 mg/dL — AB (ref 65–99)
GLUCOSE-CAPILLARY: 311 mg/dL — AB (ref 65–99)
GLUCOSE-CAPILLARY: 70 mg/dL (ref 65–99)

## 2015-12-24 LAB — PROTIME-INR
INR: 2.04 — AB (ref 0.00–1.49)
Prothrombin Time: 22.9 seconds — ABNORMAL HIGH (ref 11.6–15.2)

## 2015-12-24 MED ORDER — INSULIN PEN NEEDLE 31G X 5 MM MISC: 1.0000 | Freq: Three times a day (TID) | Status: DC

## 2015-12-24 MED ORDER — INSULIN GLARGINE 100 UNIT/ML SOLOSTAR PEN: 10.0000 [IU] | PEN_INJECTOR | Freq: Every day | SUBCUTANEOUS | Status: DC

## 2015-12-24 MED ORDER — INSULIN ASPART 100 UNIT/ML ~~LOC~~ SOLN
0.0000 [IU] | Freq: Three times a day (TID) | SUBCUTANEOUS | Status: DC
  Administered 2015-12-24: 11 [IU] via SUBCUTANEOUS

## 2015-12-24 MED ORDER — INSULIN ASPART 100 UNIT/ML ~~LOC~~ SOLN: 0.0000 [IU] | Freq: Every day | SUBCUTANEOUS | Status: DC

## 2015-12-24 MED ORDER — WARFARIN SODIUM 7.5 MG PO TABS: 7.5000 mg | ORAL_TABLET | ORAL | Status: DC

## 2015-12-24 MED ORDER — POLYETHYLENE GLYCOL 3350 17 G PO PACK: 17.0000 g | PACK | Freq: Every day | ORAL | Status: DC

## 2015-12-24 MED ORDER — POLYETHYLENE GLYCOL 3350 17 G PO PACK
17.0000 g | PACK | Freq: Every day | ORAL | Status: DC
  Administered 2015-12-24: 17 g via ORAL
  Filled 2015-12-24 (×2): qty 1

## 2015-12-24 MED ORDER — FUROSEMIDE 20 MG PO TABS: 20.0000 mg | ORAL_TABLET | Freq: Every day | ORAL | Status: DC

## 2015-12-24 MED ORDER — WARFARIN SODIUM 5 MG PO TABS: 5.0000 mg | ORAL_TABLET | ORAL | Status: DC

## 2015-12-24 NOTE — Progress Notes (Addendum)
Pt ambulated 300 feet in hall on room air. Pt oxygen saturation >95% for entirety of walk. Pt tolerated well. Pt states he uses cane at home, but refused the Treyson Axel. Pt steady.  Leonidas Romberg, RN

## 2015-12-24 NOTE — Progress Notes (Signed)
Nutrition Follow Up  DOCUMENTATION CODES:   Not applicable  INTERVENTION:    Continue Carbohydrate Modified diet  NUTRITION DIAGNOSIS:   Inadequate oral intake related to inability to eat as evidenced by NPO status, resolved  GOAL:   Patient will meet greater than or equal to 90% of their needs, met  MONITOR:   PO intake, Labs, Weight trends, Skin, I & O's  ASSESSMENT:   65 yo male presented with dyspnea, acute encephalopathy, respiratory failure, HTN urgency, acute pulmonary edema, lactic acidosis. Required intubation on admission. Recent hospital admission from 5/20 to 5/24 with UTI, E coli bacteremia, acute on chronic combined CHF, acute pulmonary edema and VDRF.  Patient extubated 5/31. Diet advanced to Carbohydrate Modified 6/2. PO intake 100% per flowsheet records.  Diet Order:  Diet Carb Modified Fluid consistency:: Thin; Room service appropriate?: Yes Diet - low sodium heart healthy Diet Carb Modified  Skin:  Reviewed, no issues  Last BM:  5/28  Height:   Ht Readings from Last 1 Encounters:  12/23/15 '5\' 6"'$  (1.676 m)    Weight:   Wt Readings from Last 1 Encounters:  12/23/15 151 lb (68.493 kg)    Ideal Body Weight:  64.5 kg  BMI:  Body mass index is 24.38 kg/(m^2).  Estimated Nutritional Needs:   Kcal:  1700-1900  Protein:  90-100 gm  Fluid:  1.7-1.9 L  EDUCATION NEEDS:   No education needs identified at this time  Arthur Holms, RD, LDN Pager #: [phone] After-Hours Pager #: [phone]

## 2015-12-24 NOTE — Care Management Note (Addendum)
Case Management Note  Patient Details  Name: Eddie [name] MRN: [id number] Date of Birth: 16-Aug-1950  Subjective/Objective:       HTN             Action/Plan: Discharge Planning:  NCM spoke to pt and states he receives his social security check. Pt states he got his medication but it cost $200 for all his medication. And states his income is limited. Spoke to wife, [name] on the phone. States she will bring all his medications when she comes. He has a cane at home. Pt was going to outpt PT. Waiting PT recommendations.   1615 NCM spoke to wife at bedside.  Pt's Lantus 348.00 at pharmacy. Per pharmacy, [employer] not covering meds. Contacted [employer] while in room. States that pt's is not current on payments and insurance will not pay for medications until he pays premiums. Explained MATCH program and program can be used once per year. And copay $3 for medications. Provided pt with Lantus $10 copay card to use once they have reinstated insurance.    Expected Discharge Date:  12/24/2015               Expected Discharge Plan:  Home/Self Care  In-House Referral:  NA  Discharge planning Services  CM Consult, Medication Assistance  Post Acute Care Choice:  NA Choice offered to:  NA  DME Arranged:  N/A DME Agency:  NA  HH Arranged:  NA HH Agency:  NA  Status of Service:  Completed, signed off  Medicare Important Message Given:    Date Medicare IM Given:    Medicare IM give by:    Date Additional Medicare IM Given:    Additional Medicare Important Message give by:     If discussed at Long Length of Stay Meetings, dates discussed:    Additional Comments:  Elliot Cousin, RN 12/24/2015, 2:17 PM

## 2015-12-24 NOTE — Progress Notes (Signed)
ANTICOAGULATION CONSULT NOTE - Follow Up Consult  Pharmacy Consult for Coumadin Indication: Hx CVA  No Known Allergies  Patient Measurements: Height: 5\' 6"  (167.6 cm) Weight: 151 lb (68.493 kg) IBW/kg (Calculated) : 63.8  Vital Signs: Temp: 98 F (36.7 C) (06/02 0423) Temp Source: Oral (06/02 0423) BP: 119/65 mmHg (06/02 0423) Pulse Rate: 80 (06/02 0423)  Labs:  Recent Labs  12/21/15 1036 12/21/15 1412 12/22/15 0506 12/23/15 0330 12/24/15 0230  HGB  --   --  9.3*  --  8.8*  HCT  --   --  31.1*  --  30.3*  PLT  --   --  334  --  314  LABPROT  --   --  24.3* 22.1* 22.9*  INR  --   --  2.21* 1.94* 2.04*  CREATININE  --  1.16 1.28*  --  1.36*  TROPONINI 0.14*  --   --   --   --     Estimated Creatinine Clearance: 49.5 mL/min (by C-G formula based on Cr of 1.36).   Assessment: 65 year old male admitted with VDRF now extubated and hx embolic CVA continuing on home Coumadin.  -INR= 2.04  Home regimen Coumadin 7.5mg  Monday and Fridays and 5 mg all other days.  Goal of Therapy:  INR 2-3 Monitor platelets by anticoagulation protocol: Yes   Plan:  -Will resume coumadin at home dose -Daily PT/INR for now  Harland German, Pharm D 12/24/2015 10:21 AM

## 2015-12-24 NOTE — Progress Notes (Signed)
Discussed with the patient and his wife and all questioned fully answered. He will call me if any problems arise.  Thorough education on medications, lantus administration, daily weights, heart failure, when to call MD.   IVs removed. CCMD notified.   Leonidas Romberg, RN

## 2015-12-24 NOTE — Evaluation (Signed)
Physical Therapy Evaluation Patient Details Name: YIDEL TEUSCHER MRN: [id number] DOB: 1951-02-13 Today's Date: 12/24/2015   History of Present Illness  Pt adm with acute respiratory failure due to pulmonary edema and hypertensive urgency. Pt intubated 5/20-5/21. PMH - recent rt CVA, NSTEMI, HTN, CHF  Clinical Impression  Pt doing well with mobility and no further acute PT needed.  Ready for dc from PT standpoint.      Follow Up Recommendations Outpatient PT (resume)    Equipment Recommendations  None recommended by PT    Recommendations for Other Services       Precautions / Restrictions Precautions Precautions: Fall      Mobility  Bed Mobility               General bed mobility comments: Pt up in chair  Transfers Overall transfer level: Modified independent                  Ambulation/Gait Ambulation/Gait assistance: Modified independent (Device/Increase time) Ambulation Distance (Feet): 500 Feet Assistive device: Straight cane Gait Pattern/deviations: Step-through pattern     General Gait Details: 1 slight loss of balance with turn but pt able to recover independently  Stairs            Wheelchair Mobility    Modified Rankin (Stroke Patients Only)       Balance Overall balance assessment: Needs assistance Sitting-balance support: No upper extremity supported Sitting balance-Leahy Scale: Good     Standing balance support: No upper extremity supported Standing balance-Leahy Scale: Good                               Pertinent Vitals/Pain Pain Assessment: No/denies pain    Home Living Family/patient expects to be discharged to:: Private residence Living Arrangements: Spouse/significant other Available Help at Discharge: Family Type of Home: House Home Access: Stairs to enter   Secretary/administrator of Steps: 1 Home Layout: One level Home Equipment: None Additional Comments: Worked prior to recent CVA.    Prior  Function Level of Independence: Needs assistance   Gait / Transfers Assistance Needed: Amb with cane outside and no device inside. Currently receiving OPPT.           Hand Dominance   Dominant Hand: Right    Extremity/Trunk Assessment   Upper Extremity Assessment: Overall WFL for tasks assessed           Lower Extremity Assessment: Overall WFL for tasks assessed         Communication   Communication: No difficulties  Cognition Arousal/Alertness: Awake/alert Behavior During Therapy: WFL for tasks assessed/performed Overall Cognitive Status: Within Functional Limits for tasks assessed                      General Comments      Exercises        Assessment/Plan    PT Assessment All further PT needs can be met in the next venue of care  PT Diagnosis Difficulty walking   PT Problem List Decreased balance;Decreased mobility  PT Treatment Interventions     PT Goals (Current goals can be found in the Care Plan section)      Frequency     Barriers to discharge        Co-evaluation               End of Session   Activity Tolerance: Patient tolerated treatment well Patient  left: with call bell/phone within reach;in chair (sitting EOB) Nurse Communication: Mobility status         Time: 1422-1430 PT Time Calculation (min) (ACUTE ONLY): 8 min   Charges:   PT Evaluation $PT Eval Low Complexity: 1 Procedure     PT G Codes:        Haidy Kackley 2016/01/03, 2:45 PM [facility] PT [phone]

## 2015-12-25 LAB — CULTURE, BLOOD (ROUTINE X 2): CULTURE: NO GROWTH

## 2015-12-26 LAB — CULTURE, BLOOD (ROUTINE X 2): CULTURE: NO GROWTH

## 2015-12-26 NOTE — Discharge Summary (Signed)
[facility] Discharge Summary   Patient: Eddie [name] FXT:[id number]   PCP: Philis Fendt, MD DOB: 1950-08-13   Date of admission: 12/20/2015   Date of discharge: 12/24/2015    Discharge Diagnoses:  Active Problems:   Hypertensive emergency   Pulmonary edema   Encounter for feeding tube placement  Recommendations for Outpatient Follow-up:  1. Please follow up with PCP in 1 week   Follow-up Information    Follow up with Philis Fendt, MD. Schedule an appointment as soon as possible for a visit in 1 week.   Specialty:  Internal Medicine   Why:  with BMP    Contact information:   [place] [address] [phone]       Follow up with [facility]. Schedule an appointment as soon as possible for a visit on 12/27/2015.   Specialty:  Cardiology   Why:  for INR check up.    Contact information:   [address] [place] [phone]     Diet recommendation: cardiac diet  Activity: The patient is advised to gradually reintroduce usual activities.  Discharge Condition: good  History of present illness: As per the H and P dictated on admission, "65 yo male was in hospital from 5/20 to 5/24 with UTI, E coli bacteremia, acute on chronic combined CHF, acute pulmonary edema and VDRF.  He developed sudden onset of shortness of breath on 5/29. He was not able to speak on arrival by EMS. He was tried on CPAP, but became unresponsive. He required intubation. He had BP of 216/138. He was started on NTG gtt. His blood pressure improved."  Hospital Course:  Summary of his active problems in the hospital is as following.  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 He needed to be intubated in ER. Pt was given nitroglycerin drip and given lasix, foley catheter was inserted. PCCM admitted the pt to ICU. Pt was  started on broad spectrum antibiotics. Extubated on 12/23/2015. Pt was on room air and his symptoms completely resolved, labs were stable and vitals were stable. PT recommended no therapy needs. Pt's oxygenation was also stable on room air on rest as well as on exertion.  All other chronic medical condition were stable during the hospitalization.  Patient was seen by physical therapy, who recommended no further therapy follow up. On the day of the discharge the patient's vitals were stable, and no other acute medical condition were reported by patient. the patient was felt safe to be discharge at home with home health.  Procedures and Results:  none   Consultations:  PCCM primary admission.  DISCHARGE MEDICATION: Discharge Medication List as of 12/24/2015  2:41 PM    START taking these medications   Details  furosemide (LASIX) 20 MG tablet Take 1 tablet (20 mg total) by mouth daily., Starting 12/25/2015, Until Discontinued, Normal    Insulin Glargine (LANTUS) 100 UNIT/ML Solostar Pen Inject 10 Units into the skin daily at 10 pm., Starting 12/24/2015, Until Discontinued, Normal    Insulin Pen Needle 31G X 5 MM MISC 1 each by Does not apply route 3 (three) times daily before meals., Starting 12/24/2015, Until Discontinued, Normal    polyethylene glycol (MIRALAX / GLYCOLAX) packet Take 17 g by mouth daily., Starting 12/24/2015, Until Discontinued, Normal      CONTINUE these medications which have NOT CHANGED   Details  aspirin 81 MG chewable tablet Chew 1 tablet (81 mg total) by mouth daily., Starting 10/26/2015, Until Discontinued, Normal    atorvastatin (LIPITOR) 80 MG tablet Take 1 tablet (80 mg total) by mouth daily at 6 PM., Starting 11/24/2015, Until Discontinued, Normal    carvedilol (COREG) 6.25 MG tablet Take 1 tablet (6.25 mg total) by mouth 2 (two) times daily with a meal., Starting 11/24/2015, Until Discontinued, Normal    clopidogrel (PLAVIX) 75 MG tablet Take 4 tablets today then  take 1 tablet by mouth daily after that, Normal    metFORMIN (GLUCOPHAGE) 850 MG tablet Take 1 tablet (850 mg total) by mouth daily with breakfast., Starting 10/26/2015, Until Discontinued, Normal    nitroGLYCERIN (NITROSTAT) 0.4 MG SL tablet Use one pill every 5 minutes X 3 if needed for chest pain. Contact MD if pain does not improve, Normal    spironolactone (ALDACTONE) 25 MG tablet Take 0.5 tablets (12.5 mg total) by mouth daily., Starting 10/26/2015, Until Discontinued, Normal    warfarin (COUMADIN) 5 MG tablet Take 1 tablet daily or as directed by coumadin clinic, Normal    blood glucose meter kit and supplies KIT Dispense based on patient and insurance preference. Use up to four times daily as directed. (FOR ICD-9 250.00, 250.01)., Print    ipratropium-albuterol (DUONEB) 0.5-2.5 (3) MG/3ML SOLN Take 3 mLs by nebulization every 2 (two) hours as needed., Starting 12/15/2015, Until Discontinued, Print      STOP taking these medications     cefpodoxime (VANTIN) 200 MG tablet      lisinopril (PRINIVIL,ZESTRIL) 5 MG tablet        No Known Allergies Discharge Instructions    Diet - low sodium heart healthy    Complete by:  As directed      Diet Carb Modified    Complete by:  As directed      Discharge instructions    Complete by:  As directed   It is important that you read following instructions as well as go over your medication list with RN to help you understand your care after this hospitalization.  Discharge Instructions: Please follow-up with PCP in one week  Please request your primary care physician to go over all Hospital Tests and Procedure/Radiological results at the follow up,  Please get all Hospital records sent to your PCP by signing hospital release before you go home.   Do not take more than prescribed Pain, Sleep and Anxiety Medications. You were cared for by a hospitalist during your hospital stay. If you have any questions about your discharge medications or the  care you received while you were in the hospital after you are discharged, you can call the unit and ask to speak with the hospitalist on call if the hospitalist that took care of you is not available.  Once you are discharged, your primary care physician will handle any further medical issues. Please note that NO REFILLS for any discharge medications will be authorized once you are discharged, as it is imperative that you return to your primary care physician (or establish a relationship with a primary care physician if you do not have one) for your aftercare needs so that they can reassess your need for medications and monitor your lab values. You Must read complete instructions/literature along with all the possible adverse reactions/side effects for all the Medicines you take and that have been prescribed to you. Take any new Medicines after you have completely understood and accept all the possible  adverse reactions/side effects. Wear Seat belts while driving. If you have smoked or chewed Tobacco in the last 2 yrs please stop smoking and/or stop any Recreational drug use.     Increase activity slowly    Complete by:  As directed           Discharge Exam: Filed Weights   12/21/15 0400 12/22/15 0300 12/23/15 1722  Weight: 72.4 kg (159 lb 9.8 oz) 71.9 kg (158 lb 8.2 oz) 68.493 kg (151 lb)   Filed Vitals:   12/24/15 0423 12/24/15 1353  BP: 119/65 120/69  Pulse: 80 85  Temp: 98 F (36.7 C) 98.4 F (36.9 C)  Resp: 20    General: Appear in no distress, no Rash; Oral Mucosa moist. Cardiovascular: S1 and S2 Present, no Murmur, no JVD Respiratory: Bilateral Air entry present and Clear to Auscultation, no Crackles, no wheezes Abdomen: Bowel Sound present, Soft and no tenderness Extremities: no Pedal edema, no calf tenderness Neurology: Grossly no focal neuro deficit.  The results of significant diagnostics from this hospitalization (including imaging, microbiology, ancillary and  laboratory) are listed below for reference.    Significant Diagnostic Studies: Ct Head Wo Contrast  12/11/2015  CLINICAL DATA:  Decreased mental status. History of stroke in March. Concern for sepsis due to bladder infection. EXAM: CT HEAD WITHOUT CONTRAST TECHNIQUE: Contiguous axial images were obtained from the base of the skull through the vertex without intravenous contrast. COMPARISON:  None. FINDINGS: Brain: Geographic area of decreased attenuation involving the right centrum semiovale (is 21, series 201), the sequela of microvascular ischemic disease. Old infarct within the right basal ganglia (image 15, series 201). The gray-white differentiation is otherwise well maintained without CT evidence of acute large territory infarct. No intraparenchymal or extra-axial mass or hemorrhage. Normal size and configuration of the ventricles and basilar cisterns. No midline shift. Vascular: Minimal intracranial atherosclerosis Skull: Negative for fracture or focal lesion. Sinuses/Orbits: Post right-sided cataract surgery. There is underpneumatization the bilateral frontal sinuses. The remaining paranasal sinuses and mastoid air cells are normally aerated. No air-fluid levels. Other: The patient is intubated. Regional soft tissues appear normal. 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 Electronically Signed   By: Sandi Mariscal M.D.   On: 12/11/2015 08:27   Dg Chest Port 1 View  12/22/2015  CLINICAL DATA:  Pulmonary edema, hypertension, intubated patient. EXAM: PORTABLE CHEST 1 VIEW COMPARISON:  Portable chest x-ray of Dec 21, 2015 FINDINGS: The lungs are adequately inflated and clear. The heart is mildly enlarged but stable. The pulmonary vascularity is not engorged. The endotracheal tube tip lies 4.2 cm above the carina. The esophagogastric tube tip projects below the inferior margin of the image. The bony thorax exhibits no acute abnormality. IMPRESSION: Interval resolution of  pulmonary interstitial edema. Stable mild cardiomegaly. The support tubes are in stable position. Electronically Signed   By: David  Martinique M.D.   On: 12/22/2015 07:38   Dg Chest Port 1 View  12/21/2015  CLINICAL DATA:  Respiratory failure. EXAM: PORTABLE CHEST 1 VIEW COMPARISON:  12/20/2015. FINDINGS: Endotracheal tube in stable position. Interim placement of NG tube, its tip is below the left hemidiaphragm. Stable cardiomegaly. Diffuse bilateral airspace disease with slight interim improvement. No pleural effusion or pneumothorax. IMPRESSION: 1. Interim placement NG tube, its tip is below the left hemidiaphragm. Endotracheal tube in stable position. 2. Diffuse bilateral airspace disease with slight interim improvement. Stable cardiomegaly. Electronically Signed   By: Marcello Moores  Register   On: 12/21/2015 06:54  Dg Chest Port 1 View  12/20/2015  CLINICAL DATA:  Endotracheal tube placement, for respiratory distress. Initial encounter. EXAM: PORTABLE CHEST 1 VIEW COMPARISON:  Chest radiograph performed 12/12/2015 FINDINGS: The patient's endotracheal tube is seen ending 4-5 cm above the carina. Fluffy bilateral central airspace opacification may reflect multifocal pneumonia or pulmonary edema. This is new from the prior study. No pleural effusion or pneumothorax is seen. The cardiomediastinal silhouette is borderline enlarged. No acute osseous abnormalities are identified. IMPRESSION: 1. Endotracheal tube seen ending 4-5 cm above the carina. 2. Fluffy bilateral central airspace opacification may reflect multifocal pneumonia or pulmonary edema. This is new from the prior study. 3. Borderline cardiomegaly. Electronically Signed   By: Roanna Raider M.D.   On: 12/20/2015 18:33   Dg Chest Port 1 View  12/12/2015  CLINICAL DATA:  Ventilator dependent respiratory failure. STEMI and stroke in March, 2017. Patient admitted yesterday for acute on chronic shortness of breath and fever. EXAM: PORTABLE CHEST 1 VIEW  COMPARISON:  12/11/2015. FINDINGS: Endotracheal tube tip in satisfactory position projecting approximately 5 cm above the carina. Nasogastric tube courses below the diaphragm into the stomach. Cardiac silhouette upper normal in size to slightly enlarged for AP portable technique. Lungs clear. Bronchovascular markings normal. Pulmonary vascularity normal. No visible pleural effusions. No pneumothorax. Barium from the swallowing function study performed 5 days ago still present in the visualized splenic flexure the colon. IMPRESSION: 1. Support apparatus satisfactory. 2.  No acute cardiopulmonary disease. Electronically Signed   By: Hulan Saas M.D.   On: 12/12/2015 10:19   Dg Chest Port 1 View  12/11/2015  CLINICAL DATA:  Respiratory failure. History of coronary artery disease. EXAM: PORTABLE CHEST 1 VIEW COMPARISON:  12/11/2015 at 5:25 a.m. FINDINGS: Endotracheal tube is well positioned measuring 3 cm above the carina. Nasal/orogastric tube is also well positioned passing below the diaphragm into the stomach. Cardiac silhouette is borderline enlarged. No mediastinal or hilar masses or evidence of adenopathy. There is bilateral interstitial thickening with central hazy airspace opacity. Findings suggest pulmonary edema. No convincing pleural effusion. No pneumothorax on this semi-erect study. IMPRESSION: 1. Findings similar to the earlier study. 2. Interstitial thickening with mild hazy central airspace lung opacity is most likely due to pulmonary edema/congestive heart failure. 3. Endotracheal and nasogastric tubes are stable and well positioned. Electronically Signed   By: Amie Portland M.D.   On: 12/11/2015 08:11   Dg Chest Portable 1 View  12/11/2015  CLINICAL DATA:  Endotracheal tube placement EXAM: PORTABLE CHEST 1 VIEW COMPARISON:  None. FINDINGS: Endotracheal tube present with tip measuring 3.7 cm above the carina. Enteric tube is present with tip off the field of view but below the left  hemidiaphragm. Proximal side hole is positioned just below the expected location of the EG junction. Heart size is normal. There are bilateral perihilar infiltrates suggesting edema or pneumonia. No blunting of costophrenic angles. No pneumothorax. IMPRESSION: Appliances appear in satisfactory location. Bilateral perihilar infiltrates suggesting edema or pneumonia. Electronically Signed   By: Burman Nieves M.D.   On: 12/11/2015 06:16   Dg Abd Portable 1v  12/21/2015  CLINICAL DATA:  Nasogastric tube placement EXAM: PORTABLE ABDOMEN - 1 VIEW COMPARISON:  February 03, 2008 FINDINGS: Nasogastric tube tip and side port are in the stomach. There is moderate stool throughout the colon. There is no bowel dilatation or air-fluid level suggesting obstruction. No free air. Rectal thermometer is evident. There is extensive prostatic calcification. Lung bases are clear. IMPRESSION: Nasogastric tube tip  and side port in stomach. Bowel gas pattern unremarkable. No demonstrable free air or bowel obstruction. Electronically Signed   By: Lowella Grip III M.D.   On: 12/21/2015 16:51   Dg Swallowing Func-speech Pathology  12/07/2015  Objective Swallowing Evaluation: Type of Study: MBS-Modified Barium Swallow Study Patient Details Name: PAUBLO WARSHAWSKY MRN: [id number] Date of Birth: 12-Oct-1950 Today's Date: 12/07/2015 Time: SLP Start Time (ACUTE ONLY): 1033-SLP Stop Time (ACUTE ONLY): 1049 SLP Time Calculation (min) (ACUTE ONLY): 16 min Past Medical History: Past Medical History Diagnosis Date . PUD (peptic ulcer disease)  . CAD (coronary artery disease)    a. STEMI 09/2015 w/ DES to Prox LAD . CVA (cerebral infarction)    a. 09/2015: acute small right frontal lobe infarct Past Surgical History: Past Surgical History Procedure Laterality Date . Repair of peptic ulcer   . Cardiac catheterization N/A 09/28/2015   Procedure: Left Heart Cath and Coronary Angiography;  Surgeon: Lorretta Harp, MD;  Location: [facility];   Service: Cardiovascular;  Laterality: N/A; . Cardiac catheterization N/A 10/08/2015   Procedure: Left Heart Cath and Coronary Angiography;  Surgeon: Belva Crome, MD;  Location: [facility];  Service: Cardiovascular;  Laterality: N/A; HPI: 64 year old male referred for outpatient MBS to determine appropriateness for advanced diet. Pt was admitted 09/28/15 with chest pain, cardiac arrest (brief chest compressions),  NSTEMI, 13 day intubation. CVA per MRI 3/14 with acute small right frontal infarct, old left caudate and cerebellar infarcts. No Data Recorded Assessment / Plan / Recommendation CHL IP CLINICAL IMPRESSIONS 10/21/2015 Therapy Diagnosis Moderate pharyngeal phase dysphagia Clinical Impression Pt presents with mild pharyngeal dysphagia, characterized primarily by delayed swallow reflex, with trigger at the pyriform on liquids, at the vallecula on puree and solid.  This increases risk for airway compromise. Pt tolerated multiple presentations of thin via cup sip with only intermittent flash penetration which cleared completely and was not aspirated. Silent aspiration of thin via straw was noted. No post-swallow residue was noted following any consistency, however, pt continues to demonstrate independent dry swallow on each bolus. Barium tablet was noted to clear the hypopharynx without delay, however, there was what appeared to be osteophytes at C4-C7, which may be causing globus sensation with meds. Esophageal sweep revealed the distal esophagus slow to clear, with what appeared to be tertiary contractions. GI consult may be beneficial to evaluate esophageal dysmotility. Recommend advancing diet to regular solids and thin liquids via cup (no straw). Safe swallow strategies and behavioral and dietary suggestions for management of esophageal dysmotility were reviewed and provided in written form. Pt was encouraged to follow up with Outpatient SLP.   Impact on safety and function Mild aspiration risk   CHL  IP TREATMENT RECOMMENDATION 10/15/2015 Treatment Recommendations Therapy as outlined in treatment plan below   Prognosis 12/07/2015 Prognosis for Safe Diet Advancement Good CHL IP DIET RECOMMENDATION 10/22/2015 Compensations Minimize environmental distractions;Slow rate;Small sips/bites;Multiple dry swallows after each bite/sip Postural Changes Upright during meals and 30 minutes after po intake to facilitate esophageal clearing   CHL IP OTHER RECOMMENDATIONS 10/21/2015 Recommended Consults GI consult for esophageal workup Oral Care Recommendations Oral care BID Other Recommendations Return to OP SLP    CHL IP FOLLOW UP RECOMMENDATIONS 10/15/2015 Follow up Recommendations Inpatient Rehab   CHL IP FREQUENCY AND DURATION 10/15/2015 Speech Therapy Frequency (ACUTE ONLY) min 3x week Treatment Duration 2 weeks   CHL IP ORAL PHASE 12/07/2015 Oral Phase WFL  CHL IP PHARYNGEAL PHASE 12/07/2015  Pharyngeal Phase Impaired  CHL IP CERVICAL ESOPHAGEAL PHASE 10/21/2015 Cervical Esophageal Phase WFL CHL IP GO 12/07/2015 Functional Assessment Tool Used asha noms, clinical judgment, MBS Functional Limitations Swallowing Swallow Current Status (B0175) CI Swallow Goal Status (Z0258) CI Swallow Discharge Status (N2778) CI Shonna Chock 12/07/2015, 12:57 PM  [facility] [place], [facility], [place]              Microbiology: Recent Results (from the past 240 hour(s))  Urine culture     Status: Abnormal   Collection Time: 12/20/15  6:34 PM  Result Value Ref Range Status   Specimen Description URINE, RANDOM  Final   Special Requests NONE  Final   Culture 20,000 COLONIES/mL YEAST (A)  Final   Report Status 12/22/2015 FINAL  Final  Blood Culture (routine x 2)     Status: None   Collection Time: 12/20/15  9:50 PM  Result Value Ref Range Status   Specimen Description BLOOD LEFT HAND  Final   Special Requests AEROBIC BOTTLE ONLY 7ML  Final   Culture NO GROWTH 5 DAYS  Final   Report Status 12/25/2015 FINAL  Final  Blood Culture  (routine x 2)     Status: None   Collection Time: 12/21/15 12:53 AM  Result Value Ref Range Status   Specimen Description BLOOD RIGHT HAND  Final   Special Requests IN PEDIATRIC BOTTLE 3CC  Final   Culture NO GROWTH 5 DAYS  Final   Report Status 12/26/2015 FINAL  Final     Labs: CBC:  Recent Labs Lab 12/20/15 1924 12/22/15 0506 12/24/15 0230  WBC 10.9* 13.8* 9.5  NEUTROABS 5.4 11.2*  --   HGB 9.7* 9.3* 8.8*  HCT 33.8* 31.1* 30.3*  MCV 86.0 82.3 85.4  PLT 355 334 242   Basic Metabolic Panel:  Recent Labs Lab 12/20/15 1924 12/21/15 0653 12/21/15 1412 12/22/15 0506 12/24/15 0230  NA 139 139 142 140 139  K 3.8 2.9* 5.0 3.5 3.9  CL 105 103 105 102 100*  CO2 '24 26 28 29 29  '$ GLUCOSE 393* 212* 123* 194* 50*  BUN '7 12 10 13 '$ 31*  CREATININE 1.35* 1.15 1.16 1.28* 1.36*  CALCIUM 8.5* 8.7* 9.0 8.7* 8.5*  MG  --  1.6*  --   --   --   PHOS  --  1.9* 3.0 4.2  --    Liver Function Tests:  Recent Labs Lab 12/20/15 1924 12/21/15 0653 12/22/15 0506  AST 39 23 18  ALT '23 19 18  '$ ALKPHOS 112 96 89  BILITOT 0.5 0.6 0.6  PROT 7.6 6.9 7.2  ALBUMIN 3.1* 2.7* 2.8*   No results for input(s): LIPASE, AMYLASE in the last 168 hours. No results for input(s): AMMONIA in the last 168 hours. Cardiac Enzymes:  Recent Labs Lab 12/20/15 2256 12/21/15 0653 12/21/15 1036  TROPONINI 0.05* 0.14* 0.14*   BNP (last 3 results)  Recent Labs  12/11/15 0720 12/20/15 1928  BNP 492.7* 1475.7*   CBG:  Recent Labs Lab 12/23/15 2044 12/24/15 0009 12/24/15 0426 12/24/15 0803 12/24/15 1131  GLUCAP 320* 181* 70 175* 311*   Time spent: 30 minutes  Signed:  Alaija Ruble  [facility] 12/24/2015 , 9:32 PM

## 2015-12-28 ENCOUNTER — Telehealth: Payer: Self-pay

## 2015-12-28 ENCOUNTER — Ambulatory Visit: Payer: BLUE CROSS/BLUE SHIELD

## 2015-12-28 DIAGNOSIS — I639 Cerebral infarction, unspecified: Secondary | ICD-10-CM

## 2015-12-28 NOTE — Therapy (Signed)
[facility] [address] [place], [place], [address] Phone: [phone]   Fax:  [phone]  Patient Details  Name: Eddie [name] MRN: [id number] Date of Birth: Nov 10, 1950 Referring Provider:  Erick Colace, MD  Encounter Date: 12/28/2015  Arrived - no charge  Pt arrived without resume orders since his re-admission last week. He/wife were instructed to obtain resume orders in order to retun to outpatient therapies.   [facility] ,MS, CCC-SLP  12/28/2015, 9:05 AM  [facility] [address] [place], [place], [address] Phone: [phone]   Fax:  [phone]

## 2015-12-28 NOTE — Telephone Encounter (Signed)
Pt's wife called to let us know that we need to place an order for pt to resume speech therapy after being discharged from the hospital. Order has been placed.

## 2015-12-30 ENCOUNTER — Ambulatory Visit: Payer: BLUE CROSS/BLUE SHIELD

## 2015-12-30 ENCOUNTER — Ambulatory Visit: Payer: BLUE CROSS/BLUE SHIELD | Attending: Physical Medicine & Rehabilitation

## 2015-12-30 VITALS — BP 157/95 | HR 78

## 2015-12-30 DIAGNOSIS — R41841 Cognitive communication deficit: Secondary | ICD-10-CM

## 2015-12-30 DIAGNOSIS — I69354 Hemiplegia and hemiparesis following cerebral infarction affecting left non-dominant side: Secondary | ICD-10-CM

## 2015-12-30 DIAGNOSIS — R1313 Dysphagia, pharyngeal phase: Secondary | ICD-10-CM

## 2015-12-30 DIAGNOSIS — R498 Other voice and resonance disorders: Secondary | ICD-10-CM

## 2015-12-30 DIAGNOSIS — R2689 Other abnormalities of gait and mobility: Secondary | ICD-10-CM

## 2015-12-30 NOTE — Therapy (Signed)
[facility] [address] [place], [place], [address] Phone: [phone]   Fax:  [phone]  Physical Therapy Treatment  Patient Details  Name: Eddie [name] MRN: [id number] Date of Birth: Dec 30, 1950 Referring Provider: Dr. Letta Pate  Encounter Date: 12/30/2015      PT End of Session - 12/30/15 1333    Visit Number 4   Number of Visits 9   Date for PT Re-Evaluation 12/02/15   Authorization Type [employer]-30 visit combo limit   PT Start Time 0932   PT Stop Time 1013   PT Time Calculation (min) 41 min   Equipment Utilized During Treatment Gait belt   Activity Tolerance Patient tolerated treatment well   Behavior During Therapy [facility] for tasks assessed/performed      Past Medical History  Diagnosis Date  . PUD (peptic ulcer disease)   . CAD (coronary artery disease)     a. STEMI 09/2015 w/ DES to Prox LAD  . CVA (cerebral infarction)     a. 09/2015: acute small right frontal lobe infarct    Past Surgical History  Procedure Laterality Date  . Repair of peptic ulcer    . Cardiac catheterization N/A 09/28/2015    Procedure: Left Heart Cath and Coronary Angiography;  Surgeon: Lorretta Harp, MD;  Location: [place] [facility];  Service: Cardiovascular;  Laterality: N/A;  . Cardiac catheterization N/A 10/08/2015    Procedure: Left Heart Cath and Coronary Angiography;  Surgeon: Belva Crome, MD;  Location: [facility];  Service: Cardiovascular;  Laterality: N/A;    Filed Vitals:   12/30/15 0937 12/30/15 1005 12/30/15 1008  BP: 149/90 184/94 157/95  Pulse: 80 88 78        Subjective Assessment - 12/30/15 0934    Subjective Pt reported he was in the hospital for difficulty breathing and is now using a nebulizer breathing treatment. Pt denied falls since last session. Pt's resume speech and PT order in pt chart.   Patient is accompained by: Family member   Pertinent History MI, HTN, DM, CAD, L eye blindness   Patient Stated Goals Walk without cane, wash car without losing balance   Currently in Pain? No/denies            [facility] PT Assessment - 12/30/15 0941    Functional Gait  Assessment   Gait assessed  Yes   Gait Level Surface Walks 20 ft, slow speed, abnormal gait pattern, evidence for imbalance or deviates 10-15 in outside of the 12 in walkway width. Requires more than 7 sec to ambulate 20 ft.  8.6sec.   Change in Gait Speed Able to smoothly change walking speed without loss of balance or gait deviation. Deviate no more than 6 in outside of the 12 in walkway width.   Gait with Horizontal Head Turns Performs head turns smoothly with slight change in gait velocity (eg, minor disruption to smooth gait path), deviates 6-10 in outside 12 in walkway width, or uses an assistive device.   Gait with Vertical Head Turns Performs task with moderate change in gait velocity, slows down, deviates 10-15 in outside 12 in walkway width but recovers, can continue to walk.   Gait and Pivot Turn Pivot turns safely within 3 sec and stops quickly with no loss of balance.   Step Over Obstacle Is able to step over 2 stacked shoe boxes taped together (9 in total height) without changing gait speed. No evidence of imbalance.   Gait with  Narrow Base of Support Ambulates less than 4 steps heel to toe or cannot perform without assistance.   Gait with Eyes Closed Walks 20 ft, slow speed, abnormal gait pattern, evidence for imbalance, deviates 10-15 in outside 12 in walkway width. Requires more than 9 sec to ambulate 20 ft.  10.9sec.   Ambulating Backwards Walks 20 ft, slow speed, abnormal gait pattern, evidence for imbalance, deviates 10-15 in outside 12 in walkway width.  12.7sec.   Steps Alternating feet, must use rail.  Two feet to a stair to descend without rail   Total Score 17                     [facility] Adult PT Treatment/Exercise - 12/30/15 0952    Ambulation/Gait   Ambulation/Gait Yes    Ambulation/Gait Assistance 5: Supervision;4: Min guard   Ambulation/Gait Assistance Details Pt amb. indoors and outdoors over even/uneven paved surfaces and grassy terrain. Pt S over paved surfaces and min guard over grassy terrain. Cues to improve stride length, heel strike and improve narrow BOS. Pt noted to breathe heavily after amb. but denied difficulty breathing. Pt's BP incr. after amb., required seated rest break to allow BP to decr. See vitals for details. Pt denied SOB, chest pain, weakness, HA.   Ambulation Distance (Feet) 600 Feet, 100'x2   Assistive device None   Gait Pattern Step-through pattern;Decreased stride length;Narrow base of support;Decreased trunk rotation   Ambulation Surface Level;Unlevel;Indoor;Outdoor;Paved;Grass                PT Education - 12/30/15 1332    Education provided Yes   Education Details PT discussed goal progress and renewal for 2x/week for 4 weeks to improve balance and safety during gait. PT also reiterated the importance of assessing BP at home before and during activity and that BP should be <160/100 for safety and to go to ED if BP is increased and he has CVA/MI sx's.  PT educated pt on the importance of using nebulizer treatment as instructed by MD, as pt reported he had difficulty breathing last night but did not use tx.   Person(s) Educated Patient;Spouse   Methods Explanation   Comprehension Verbalized understanding          PT Short Term Goals - 11/02/15 1300    PT SHORT TERM GOAL #1   Title same as LTGs           PT Long Term Goals - 12/30/15 1347    PT LONG TERM GOAL #1   Title Pt will be IND in HEP to improve balance and strength. Target date: 11/30/15   Baseline All unmet goals will be carried over to new POC: 01/27/16   Status On-going   PT LONG TERM GOAL #2   Title Pt will improve FGA score to >/=25/30 to decr. falls risk. Target date: 11/30/15   Status Not Met   PT LONG TERM GOAL #3   Title Pt will amb. 300' IND  without AD, over uneven terrain (sand, grass), in order to improve functional mobility and to go to the beach this summer/fall. Target date: 11/30/15   Status Not Met   PT LONG TERM GOAL #4   Title Pt will amb. 1000' over paved surfaces (even) IND, no AD, in order to walk dog and improve functional mobility. . Target date: 11/30/15   Status Not Met   PT LONG TERM GOAL #5   Title Pt will verbalize walking program and potentially how to  progress to light jogging based on progress, in order to walk/jog with dog. Target date: 11/30/15   Status Deferred               Plan - 12/30/15 1339    Clinical Impression Statement Today's skilled PT session focused on re-assessing pt goals, as he has missed one month of PT due to hospitalizations and elevated BP. Pt has been cleared by MD to resume therapy. Pt did not meet goals, due to not missing the last month of PT. Pt is highly motivated to participate in PT and would benefit from skilled PT to improve functional moiblity. Pt's FGA score still indicates pt is at risk for falls. Pt also required seated rest break after activity to allow BP to decrease.    Rehab Potential Good   Clinical Impairments Affecting Rehab Potential co-morbidities   PT Frequency 2x / week   PT Duration 4 weeks  PT requesting additional 2x/week for 4 weeks   PT Treatment/Interventions ADLs/Self Care Home Management;Biofeedback;Canalith Repostioning;Therapeutic activities;Therapeutic exercise;Balance training;Manual techniques;Vestibular;Orthotic Fit/Training;Functional mobility training;Stair training;Gait training;DME Instruction;Patient/family education;Cognitive remediation   PT Next Visit Plan Check BP, assess HEP and progress/modify as needed.   PT Home Exercise Plan Strengthening, balance, and flexibility HEP   Consulted and Agree with Plan of Care Patient   Family Member Consulted pt's wife: [place]      Patient will benefit from skilled therapeutic intervention in order  to improve the following deficits and impairments:  Abnormal gait, Decreased endurance, Decreased knowledge of use of DME, Decreased strength, Impaired flexibility, Decreased cognition, Decreased balance, Decreased mobility  Visit Diagnosis: Other abnormalities of gait and mobility - Plan: PT plan of care cert/re-cert  Hemiplegia and hemiparesis following cerebral infarction affecting left non-dominant side ([place]) - Plan: PT plan of care cert/re-cert     Problem List Patient Active Problem List   Diagnosis Date Noted  . Pulmonary edema   . Encounter for feeding tube placement   . Hypertensive emergency 12/20/2015  . UTI (urinary tract infection) 12/14/2015  . Bacteremia, escherichia coli 12/14/2015  . Respiratory failure ([place]) 12/11/2015  . Acute respiratory failure with hypercapnia ([place])   . Demand ischemia ([place])   . Gait disturbance, post-stroke 12/09/2015  . Cognitive deficit, post-stroke 12/09/2015  . Numbness and tingling of both legs below knees 12/09/2015  . Long term (current) use of anticoagulants [Z79.01] 11/01/2015  . Ischemic stroke of frontal lobe ([place]) 10/15/2015  . Anoxic encephalopathy ([place]) 10/15/2015  . Right-sided cerebrovascular accident (CVA) ([place])   . Acute on chronic systolic congestive heart failure ([place])   . HCAP (healthcare-associated pneumonia)   . Dysphagia as late effect of cerebrovascular disease   . Tobacco abuse   . Tachypnea   . Essential hypertension   . Type 2 diabetes mellitus with complication ([place])   . Leukocytosis   . Absolute anemia   . Altered mental status   . Hemiplegia ([place])   . Stroke ([place])   . ARDS (adult respiratory distress syndrome) ([place])   . Acute respiratory failure ([place])   . STEMI (ST elevation myocardial infarction) ([place]) 09/28/2015  . Cardiac arrest ([place])   . ST elevation myocardial infarction (STEMI) ([place])   . Encounter for central line placement   . Hypokalemia   . Encephalopathy acute   . Acute hypoxemic  respiratory failure ([place])     Rosela Supak L 12/30/2015, 1:50 PM  [facility] [address] [place], [place], [address] Phone: [phone]  Fax:  [phone]  Name: DOM HAVERLAND MRN: [id number] Date of Birth: 11-27-1950    Geoffry Paradise, PT,DPT 12/30/2015 1:50 PM Phone: [phone] Fax: [phone]

## 2015-12-30 NOTE — Therapy (Signed)
[facility] [address] [place], [place], [address] Phone: [phone]   Fax:  [phone]  Speech Language Pathology Treatment  Patient Details  Name: Eddie [name] MRN: [id number] Date of Birth: [birth year] Referring Provider: Claudette Laws, MD  Encounter Date: 12/30/2015      End of Session - 12/30/15 1040    Visit Number 8   Number of Visits 16   Authorization - Visit Number 8   Authorization - Number of Visits 30   SLP Start Time 0848   SLP Stop Time  0931   SLP Time Calculation (min) 43 min   Activity Tolerance Patient tolerated treatment well      Past Medical History  Diagnosis Date  . PUD (peptic ulcer disease)   . CAD (coronary artery disease)     a. STEMI 09/2015 w/ DES to Prox LAD  . CVA (cerebral infarction)     a. 09/2015: acute small right frontal lobe infarct    Past Surgical History  Procedure Laterality Date  . Repair of peptic ulcer    . Cardiac catheterization N/A 09/28/2015    Procedure: Left Heart Cath and Coronary Angiography;  Surgeon: Runell Gess, MD;  Location: [facility] INVASIVE CV LAB;  Service: Cardiovascular;  Laterality: N/A;  . Cardiac catheterization N/A 10/08/2015    Procedure: Left Heart Cath and Coronary Angiography;  Surgeon: Lyn Records, MD;  Location: [facility] INVASIVE CV LAB;  Service: Cardiovascular;  Laterality: N/A;    There were no vitals filed for this visit.      Subjective Assessment - 12/30/15 0901    Subjective "I'm taking everything they give me." Pt admitted last week with BP 216/138.   Patient is accompained by: Family member  [name]               ADULT SLP TREATMENT - 12/30/15 0933    General Information   Behavior/Cognition Alert;Cooperative;Pleasant mood   Treatment Provided   Treatment provided Dysphagia   Dysphagia Treatment   Temperature Spikes Noted No   Respiratory Status Room air   Oral Cavity - Dentition Missing dentition   Treatment Methods Skilled observation;Compensation strategy training   Patient observed directly with PO's Yes   Type of PO's observed Regular;Thin liquids   Liquids provided via Cup   Oral Phase Signs & Symptoms --  none noted   Pharyngeal Phase Signs & Symptoms Wet vocal quality  delayed   Type of cueing Verbal;Visual   Amount of cueing Moderate  faded to min   Other treatment/comments Pt req'd initial cues (mod) to use multiple swallows, faded to min cues rarely. When double swallows not used, pt exhibited wet voice.  SLP cued pt to clear throat and reswallow with wet voice.   Pain Assessment   Pain Assessment No/denies pain   Cognitive-Linquistic Treatment   Skilled Treatment SLP spent 13 minutes educating pt/wife on necessity of checking pt's blood sugar daily and reasoning through how to obtain eduation about how to use blood sugar meter/glocose sticks. Pt's wife to go to pharmacy to see if someone there could show her, if not possible, they will stop by PCP's office to get educated.   Assessment / Recommendations / Plan   Plan Continue with current plan of care   Dysphagia Recommendations   Diet recommendations Regular;Thin liquid   Compensations Slow rate;Small sips/bites;Multiple dry swallows after each bite/sip   Postural Changes and/or Swallow Maneuvers Upright 30-60 min after meal   Progression Toward  Goals   Progression toward goals Progressing toward goals          SLP Education - 12/30/15 1039    Education provided Yes   Education Details necessity to check pt's blood sugars up to 4 times daily   Person(s) Educated Patient;Spouse   Methods Explanation;Handout   Comprehension Verbalized understanding          SLP Short Term Goals - 12/30/15 0902    SLP SHORT TERM GOAL #1   Title pt will perform HEP for swallowing with occasional min A   Time 1   Period Weeks   Status Achieved   SLP SHORT TERM GOAL #2   Title pt will demo swallow precuations with POS with  rare min A   Time 1   Period Weeks   Status Achieved   SLP SHORT TERM GOAL #3   Title pt will state 3/4  memory compensations over 3 sessions   Time 1   Period Weeks   Status On-going          SLP Long Term Goals - 12/30/15 0902    SLP LONG TERM GOAL #1   Title pt will complete HEP for swallowing with rare min A   Status Deferred  due to successful modified   SLP LONG TERM GOAL #2   Title pt will tell SLP how using memory strategies could benefit him   Status Achieved   SLP LONG TERM GOAL #3   Title pt will demo swallowing precautions with POs with modified independence   Time 4   Period Weeks   Status On-going          Plan - 12/30/15 1040    Clinical Impression Statement Voice quality has improved since SLP last saw pt. Pt/wife state pt's cognition unchanged since prior to latest admission. Pt cont to require cues to use double swallow, independent with small bites/sips.. He would benfit from skilled ST to cont  for addressing pt's ability to monitor precautions as well as to cont to monitor memory skills and provide assistance with this PRN.   Speech Therapy Frequency 2x / week   Duration 4 weeks  5 weeks   Treatment/Interventions Aspiration precaution training;Pharyngeal strengthening exercises;Diet toleration management by SLP;Cognitive reorganization;SLP instruction and feedback;Compensatory strategies;Internal/external aids;Patient/family education;Functional tasks;Cueing hierarchy   Potential to Achieve Goals Fair   Potential Considerations Severity of impairments      Patient will benefit from skilled therapeutic intervention in order to improve the following deficits and impairments:   Dysphagia, pharyngeal phase  Cognitive communication deficit  Other voice and resonance disorders    Problem List Patient Active Problem List   Diagnosis Date Noted  . Pulmonary edema   . Encounter for feeding tube placement   . Hypertensive emergency 12/20/2015  .  UTI (urinary tract infection) 12/14/2015  . Bacteremia, escherichia coli 12/14/2015  . Respiratory failure ([facility]) 12/11/2015  . Acute respiratory failure with hypercapnia (H[facility])   . Demand ischemia (H[facility])   . Gait disturbance, post-stroke 12/09/2015  . Cognitive deficit, post-stroke 12/09/2015  . Numbness and tingling of both legs below knees 12/09/2015  . Long term (current) use of anticoagulants [Z79.01] 11/01/2015  . Ischemic stroke of frontal lobe (H[facility]) 10/15/2015  . Anoxic encephalopathy (H[facility]) 10/15/2015  . Right-sided cerebrovascular accident (CVA) (H[facility])   . Acute on chronic systolic congestive heart failure (H[facility])   . HCAP (healthcare-associated pneumonia)   . Dysphagia as late effect of cerebrovascular disease   . Tobacco abuse   .  Tachypnea   . Essential hypertension   . Type 2 diabetes mellitus with complication (H[facility])   . Leukocytosis   . Absolute anemia   . Altered mental status   . Hemiplegia (HCC)   . Stroke (HCC)   . ARDS (adult respiratory distress syndrome) (HCC)   . Acute respiratory failure (HCC)   . STEMI (ST elevation myocardial infarction) ([facility]) 09/28/2015  . Cardiac arrest (H[facility])   . ST elevation myocardial infarction (STEMI) (HCC)   . Encounter for central line placement   . Hypokalemia   . Encephalopathy acute   . Acute hypoxemic respiratory failure (HCC)     Aoife Bold ,MS, CCC-SLP  12/30/2015, 10:43 AM  [facility] [address] [place], [place], [address] Phone: [phone]   Fax:  [phone]   Name: Eddie [name] MRN: [id number] Date of Birth: Nov 23, 1950

## 2015-12-30 NOTE — Patient Instructions (Signed)
Stop by [facility] and see if they have a blood glucose meter and sticks as a prescription.  See if insurance will pay for it. Get education about how to use it. Check blood sugar up to 4 times daily.

## 2016-01-05 ENCOUNTER — Inpatient Hospital Stay (HOSPITAL_COMMUNITY): Payer: BLUE CROSS/BLUE SHIELD

## 2016-01-05 ENCOUNTER — Ambulatory Visit: Payer: BLUE CROSS/BLUE SHIELD | Admitting: Physical Therapy

## 2016-01-05 ENCOUNTER — Emergency Department (HOSPITAL_COMMUNITY): Payer: BLUE CROSS/BLUE SHIELD

## 2016-01-05 ENCOUNTER — Encounter (HOSPITAL_COMMUNITY): Payer: Self-pay | Admitting: Emergency Medicine

## 2016-01-05 ENCOUNTER — Inpatient Hospital Stay (HOSPITAL_COMMUNITY)
Admission: EM | Admit: 2016-01-05 | Discharge: 2016-01-09 | DRG: 291 | Disposition: A | Discharge status: Home Health | Payer: BLUE CROSS/BLUE SHIELD | Attending: Internal Medicine | Admitting: Internal Medicine

## 2016-01-05 DIAGNOSIS — Z9114 Patient's other noncompliance with medication regimen: Secondary | ICD-10-CM | POA: Diagnosis not present

## 2016-01-05 DIAGNOSIS — I169 Hypertensive crisis, unspecified: Secondary | ICD-10-CM | POA: Diagnosis present

## 2016-01-05 DIAGNOSIS — R402212 Coma scale, best verbal response, none, at arrival to emergency department: Secondary | ICD-10-CM | POA: Diagnosis present

## 2016-01-05 DIAGNOSIS — N179 Acute kidney failure, unspecified: Secondary | ICD-10-CM | POA: Diagnosis present

## 2016-01-05 DIAGNOSIS — I252 Old myocardial infarction: Secondary | ICD-10-CM

## 2016-01-05 DIAGNOSIS — J9601 Acute respiratory failure with hypoxia: Secondary | ICD-10-CM | POA: Diagnosis present

## 2016-01-05 DIAGNOSIS — I5021 Acute systolic (congestive) heart failure: Secondary | ICD-10-CM | POA: Diagnosis not present

## 2016-01-05 DIAGNOSIS — I2510 Atherosclerotic heart disease of native coronary artery without angina pectoris: Secondary | ICD-10-CM | POA: Diagnosis present

## 2016-01-05 DIAGNOSIS — F17200 Nicotine dependence, unspecified, uncomplicated: Secondary | ICD-10-CM | POA: Diagnosis present

## 2016-01-05 DIAGNOSIS — Z8673 Personal history of transient ischemic attack (TIA), and cerebral infarction without residual deficits: Secondary | ICD-10-CM | POA: Diagnosis present

## 2016-01-05 DIAGNOSIS — I10 Essential (primary) hypertension: Secondary | ICD-10-CM | POA: Diagnosis not present

## 2016-01-05 DIAGNOSIS — Z91128 Patient's intentional underdosing of medication regimen for other reason: Secondary | ICD-10-CM | POA: Diagnosis not present

## 2016-01-05 DIAGNOSIS — R402352 Coma scale, best motor response, localizes pain, at arrival to emergency department: Secondary | ICD-10-CM | POA: Diagnosis present

## 2016-01-05 DIAGNOSIS — I6340 Cerebral infarction due to embolism of unspecified cerebral artery: Secondary | ICD-10-CM | POA: Diagnosis present

## 2016-01-05 DIAGNOSIS — J189 Pneumonia, unspecified organism: Secondary | ICD-10-CM

## 2016-01-05 DIAGNOSIS — I5042 Chronic combined systolic (congestive) and diastolic (congestive) heart failure: Secondary | ICD-10-CM | POA: Diagnosis present

## 2016-01-05 DIAGNOSIS — I110 Hypertensive heart disease with heart failure: Secondary | ICD-10-CM | POA: Diagnosis not present

## 2016-01-05 DIAGNOSIS — Z7984 Long term (current) use of oral hypoglycemic drugs: Secondary | ICD-10-CM | POA: Diagnosis not present

## 2016-01-05 DIAGNOSIS — E876 Hypokalemia: Secondary | ICD-10-CM | POA: Diagnosis not present

## 2016-01-05 DIAGNOSIS — E1122 Type 2 diabetes mellitus with diabetic chronic kidney disease: Secondary | ICD-10-CM | POA: Diagnosis present

## 2016-01-05 DIAGNOSIS — R402132 Coma scale, eyes open, to sound, at arrival to emergency department: Secondary | ICD-10-CM | POA: Diagnosis present

## 2016-01-05 DIAGNOSIS — I130 Hypertensive heart and chronic kidney disease with heart failure and stage 1 through stage 4 chronic kidney disease, or unspecified chronic kidney disease: Secondary | ICD-10-CM | POA: Diagnosis present

## 2016-01-05 DIAGNOSIS — I5023 Acute on chronic systolic (congestive) heart failure: Secondary | ICD-10-CM | POA: Diagnosis present

## 2016-01-05 DIAGNOSIS — E872 Acidosis: Secondary | ICD-10-CM | POA: Diagnosis present

## 2016-01-05 DIAGNOSIS — N182 Chronic kidney disease, stage 2 (mild): Secondary | ICD-10-CM | POA: Diagnosis present

## 2016-01-05 DIAGNOSIS — Z7901 Long term (current) use of anticoagulants: Secondary | ICD-10-CM

## 2016-01-05 DIAGNOSIS — T50906A Underdosing of unspecified drugs, medicaments and biological substances, initial encounter: Secondary | ICD-10-CM | POA: Diagnosis present

## 2016-01-05 DIAGNOSIS — J9690 Respiratory failure, unspecified, unspecified whether with hypoxia or hypercapnia: Secondary | ICD-10-CM

## 2016-01-05 DIAGNOSIS — Z794 Long term (current) use of insulin: Secondary | ICD-10-CM | POA: Diagnosis not present

## 2016-01-05 DIAGNOSIS — J441 Chronic obstructive pulmonary disease with (acute) exacerbation: Secondary | ICD-10-CM

## 2016-01-05 DIAGNOSIS — I509 Heart failure, unspecified: Secondary | ICD-10-CM

## 2016-01-05 DIAGNOSIS — I5043 Acute on chronic combined systolic (congestive) and diastolic (congestive) heart failure: Secondary | ICD-10-CM | POA: Diagnosis not present

## 2016-01-05 DIAGNOSIS — Z452 Encounter for adjustment and management of vascular access device: Secondary | ICD-10-CM

## 2016-01-05 DIAGNOSIS — Y92019 Unspecified place in single-family (private) house as the place of occurrence of the external cause: Secondary | ICD-10-CM | POA: Diagnosis not present

## 2016-01-05 DIAGNOSIS — G9349 Other encephalopathy: Secondary | ICD-10-CM | POA: Diagnosis present

## 2016-01-05 DIAGNOSIS — R7989 Other specified abnormal findings of blood chemistry: Secondary | ICD-10-CM

## 2016-01-05 DIAGNOSIS — I255 Ischemic cardiomyopathy: Secondary | ICD-10-CM | POA: Diagnosis present

## 2016-01-05 DIAGNOSIS — Z7982 Long term (current) use of aspirin: Secondary | ICD-10-CM

## 2016-01-05 DIAGNOSIS — I2102 ST elevation (STEMI) myocardial infarction involving left anterior descending coronary artery: Secondary | ICD-10-CM

## 2016-01-05 DIAGNOSIS — R570 Cardiogenic shock: Secondary | ICD-10-CM | POA: Diagnosis present

## 2016-01-05 DIAGNOSIS — J9602 Acute respiratory failure with hypercapnia: Secondary | ICD-10-CM | POA: Diagnosis not present

## 2016-01-05 DIAGNOSIS — I1310 Hypertensive heart and chronic kidney disease without heart failure, with stage 1 through stage 4 chronic kidney disease, or unspecified chronic kidney disease: Secondary | ICD-10-CM | POA: Insufficient documentation

## 2016-01-05 DIAGNOSIS — Z9119 Patient's noncompliance with other medical treatment and regimen: Secondary | ICD-10-CM

## 2016-01-05 DIAGNOSIS — R778 Other specified abnormalities of plasma proteins: Secondary | ICD-10-CM | POA: Insufficient documentation

## 2016-01-05 DIAGNOSIS — D638 Anemia in other chronic diseases classified elsewhere: Secondary | ICD-10-CM | POA: Diagnosis present

## 2016-01-05 DIAGNOSIS — Y95 Nosocomial condition: Secondary | ICD-10-CM

## 2016-01-05 DIAGNOSIS — Z955 Presence of coronary angioplasty implant and graft: Secondary | ICD-10-CM | POA: Diagnosis not present

## 2016-01-05 DIAGNOSIS — R0600 Dyspnea, unspecified: Secondary | ICD-10-CM | POA: Diagnosis present

## 2016-01-05 DIAGNOSIS — E1140 Type 2 diabetes mellitus with diabetic neuropathy, unspecified: Secondary | ICD-10-CM | POA: Diagnosis present

## 2016-01-05 DIAGNOSIS — J9600 Acute respiratory failure, unspecified whether with hypoxia or hypercapnia: Secondary | ICD-10-CM | POA: Diagnosis present

## 2016-01-05 DIAGNOSIS — K219 Gastro-esophageal reflux disease without esophagitis: Secondary | ICD-10-CM | POA: Insufficient documentation

## 2016-01-05 LAB — PHOSPHORUS: PHOSPHORUS: 6.7 mg/dL — AB (ref 2.5–4.6)

## 2016-01-05 LAB — POCT I-STAT 3, ART BLOOD GAS (G3+)
Acid-Base Excess: 1 mmol/L (ref 0.0–2.0)
BICARBONATE: 26.5 meq/L — AB (ref 20.0–24.0)
O2 SAT: 99 %
PCO2 ART: 42.7 mmHg (ref 35.0–45.0)
PO2 ART: 165 mmHg — AB (ref 80.0–100.0)
TCO2: 28 mmol/L (ref 0–100)
pH, Arterial: 7.402 (ref 7.350–7.450)

## 2016-01-05 LAB — BLOOD GAS, ARTERIAL
Acid-base deficit: 1 mmol/L (ref 0.0–2.0)
BICARBONATE: 27.1 meq/L — AB (ref 20.0–24.0)
Drawn by: 30136
FIO2: 1
LHR: 20 {breaths}/min
O2 Saturation: 99 %
PATIENT TEMPERATURE: 94.8
PCO2 ART: 74.2 mmHg — AB (ref 35.0–45.0)
PEEP: 8 cmH2O
TCO2: 29.6 mmol/L (ref 0–100)
VT: 500 mL
pH, Arterial: 7.172 — CL (ref 7.350–7.450)
pO2, Arterial: 197 mmHg — ABNORMAL HIGH (ref 80.0–100.0)

## 2016-01-05 LAB — COMPREHENSIVE METABOLIC PANEL
ALT: 23 U/L (ref 17–63)
ANION GAP: 9 (ref 5–15)
AST: 43 U/L — ABNORMAL HIGH (ref 15–41)
Albumin: 3 g/dL — ABNORMAL LOW (ref 3.5–5.0)
Alkaline Phosphatase: 105 U/L (ref 38–126)
BUN: 11 mg/dL (ref 6–20)
CALCIUM: 8.2 mg/dL — AB (ref 8.9–10.3)
CHLORIDE: 109 mmol/L (ref 101–111)
CO2: 20 mmol/L — AB (ref 22–32)
Creatinine, Ser: 1.4 mg/dL — ABNORMAL HIGH (ref 0.61–1.24)
GFR calc non Af Amer: 52 mL/min — ABNORMAL LOW (ref >60)
GFR, EST AFRICAN AMERICAN: 60 mL/min — AB (ref >60)
Glucose, Bld: 287 mg/dL — ABNORMAL HIGH (ref 65–99)
Potassium: 4.9 mmol/L (ref 3.5–5.1)
SODIUM: 138 mmol/L (ref 135–145)
Total Bilirubin: 1 mg/dL (ref 0.3–1.2)
Total Protein: 7.4 g/dL (ref 6.5–8.1)

## 2016-01-05 LAB — TRIGLYCERIDES: TRIGLYCERIDES: 63 mg/dL (ref <150)

## 2016-01-05 LAB — URINALYSIS, ROUTINE W REFLEX MICROSCOPIC
Bilirubin Urine: NEGATIVE
GLUCOSE, UA: 100 mg/dL — AB
Ketones, ur: NEGATIVE mg/dL
Nitrite: NEGATIVE
PH: 5.5 (ref 5.0–8.0)
PROTEIN: 100 mg/dL — AB
Specific Gravity, Urine: 1.017 (ref 1.005–1.030)

## 2016-01-05 LAB — CBC
HEMATOCRIT: 31 % — AB (ref 39.0–52.0)
Hemoglobin: 8.9 g/dL — ABNORMAL LOW (ref 13.0–17.0)
MCH: 25.3 pg — AB (ref 26.0–34.0)
MCHC: 28.7 g/dL — AB (ref 30.0–36.0)
MCV: 88.1 fL (ref 78.0–100.0)
Platelets: 246 10*3/uL (ref 150–400)
RBC: 3.52 MIL/uL — ABNORMAL LOW (ref 4.22–5.81)
RDW: 14.9 % (ref 11.5–15.5)
WBC: 7.5 10*3/uL (ref 4.0–10.5)

## 2016-01-05 LAB — CARBOXYHEMOGLOBIN
CARBOXYHEMOGLOBIN: 1.3 % (ref 0.5–1.5)
Carboxyhemoglobin: 2 % — ABNORMAL HIGH (ref 0.5–1.5)
METHEMOGLOBIN: 1 % (ref 0.0–1.5)
METHEMOGLOBIN: 1.1 % (ref 0.0–1.5)
O2 SAT: 56.4 %
O2 Saturation: 58.2 %
TOTAL HEMOGLOBIN: 9.6 g/dL — AB (ref 13.5–18.0)
Total hemoglobin: 8.7 g/dL — ABNORMAL LOW (ref 13.5–18.0)

## 2016-01-05 LAB — URINE MICROSCOPIC-ADD ON

## 2016-01-05 LAB — I-STAT TROPONIN, ED: Troponin i, poc: 0.05 ng/mL (ref 0.00–0.08)

## 2016-01-05 LAB — PROTIME-INR
INR: 1.64 — AB (ref 0.00–1.49)
INR: 1.79 — ABNORMAL HIGH (ref 0.00–1.49)
PROTHROMBIN TIME: 19.4 s — AB (ref 11.6–15.2)
Prothrombin Time: 20.8 seconds — ABNORMAL HIGH (ref 11.6–15.2)

## 2016-01-05 LAB — RAPID URINE DRUG SCREEN, HOSP PERFORMED
AMPHETAMINES: NOT DETECTED
BENZODIAZEPINES: NOT DETECTED
Barbiturates: NOT DETECTED
COCAINE: NOT DETECTED
OPIATES: NOT DETECTED
Tetrahydrocannabinol: NOT DETECTED

## 2016-01-05 LAB — I-STAT CG4 LACTIC ACID, ED: LACTIC ACID, VENOUS: 5.55 mmol/L — AB (ref 0.5–2.0)

## 2016-01-05 LAB — BRAIN NATRIURETIC PEPTIDE: B Natriuretic Peptide: 1148.9 pg/mL — ABNORMAL HIGH (ref 0.0–100.0)

## 2016-01-05 LAB — CBG MONITORING, ED: Glucose-Capillary: 224 mg/dL — ABNORMAL HIGH (ref 65–99)

## 2016-01-05 LAB — MAGNESIUM: MAGNESIUM: 2.4 mg/dL (ref 1.7–2.4)

## 2016-01-05 LAB — GLUCOSE, CAPILLARY
GLUCOSE-CAPILLARY: 282 mg/dL — AB (ref 65–99)
Glucose-Capillary: 221 mg/dL — ABNORMAL HIGH (ref 65–99)
Glucose-Capillary: 165 mg/dL — ABNORMAL HIGH (ref 65–99)

## 2016-01-05 LAB — PROCALCITONIN: Procalcitonin: 0.2 ng/mL

## 2016-01-05 LAB — TROPONIN I
TROPONIN I: 0.08 ng/mL — AB (ref <0.031)
Troponin I: 0.08 ng/mL — ABNORMAL HIGH (ref <0.031)

## 2016-01-05 LAB — LACTIC ACID, PLASMA: LACTIC ACID, VENOUS: 0.8 mmol/L (ref 0.5–2.0)

## 2016-01-05 MED ORDER — FENTANYL CITRATE (PF) 100 MCG/2ML IJ SOLN: 100.0000 ug | INTRAMUSCULAR | Status: DC | PRN

## 2016-01-05 MED ORDER — INSULIN ASPART 100 UNIT/ML ~~LOC~~ SOLN
0.0000 [IU] | SUBCUTANEOUS | Status: DC
  Administered 2016-01-05: 8 [IU] via SUBCUTANEOUS
  Administered 2016-01-05: 3 [IU] via SUBCUTANEOUS
  Administered 2016-01-05: 5 [IU] via SUBCUTANEOUS
  Administered 2016-01-06: 3 [IU] via SUBCUTANEOUS
  Administered 2016-01-06: 2 [IU] via SUBCUTANEOUS
  Administered 2016-01-06: 15 [IU] via SUBCUTANEOUS
  Administered 2016-01-06: 11 [IU] via SUBCUTANEOUS
  Administered 2016-01-07: 3 [IU] via SUBCUTANEOUS
  Administered 2016-01-07: 2 [IU] via SUBCUTANEOUS
  Administered 2016-01-07: 15 [IU] via SUBCUTANEOUS
  Administered 2016-01-07: 5 [IU] via SUBCUTANEOUS
  Administered 2016-01-07: 3 [IU] via SUBCUTANEOUS
  Administered 2016-01-08: 5 [IU] via SUBCUTANEOUS
  Administered 2016-01-08: 3 [IU] via SUBCUTANEOUS
  Administered 2016-01-08: 5 [IU] via SUBCUTANEOUS

## 2016-01-05 MED ORDER — CLOPIDOGREL BISULFATE 75 MG PO TABS
75.0000 mg | ORAL_TABLET | Freq: Every day | ORAL | Status: DC
  Administered 2016-01-05 – 2016-01-09 (×5): 75 mg via ORAL
  Filled 2016-01-05 (×5): qty 1

## 2016-01-05 MED ORDER — FUROSEMIDE 10 MG/ML IJ SOLN
80.0000 mg | Freq: Once | INTRAMUSCULAR | Status: AC
Start: 2016-01-05 — End: 2016-01-05
  Administered 2016-01-05: 80 mg via INTRAVENOUS
  Filled 2016-01-05: qty 8

## 2016-01-05 MED ORDER — METHYLPREDNISOLONE SODIUM SUCC 40 MG IJ SOLR
40.0000 mg | Freq: Two times a day (BID) | INTRAMUSCULAR | Status: DC
  Administered 2016-01-05 – 2016-01-06 (×2): 40 mg via INTRAVENOUS
  Filled 2016-01-05 (×2): qty 1

## 2016-01-05 MED ORDER — ASPIRIN 81 MG PO CHEW: 324.0000 mg | CHEWABLE_TABLET | ORAL | Status: AC

## 2016-01-05 MED ORDER — FAMOTIDINE IN NACL 20-0.9 MG/50ML-% IV SOLN
20.0000 mg | Freq: Two times a day (BID) | INTRAVENOUS | Status: DC
Start: 2016-01-05 — End: 2016-01-06
  Administered 2016-01-05 (×2): 20 mg via INTRAVENOUS
  Filled 2016-01-05 (×4): qty 50

## 2016-01-05 MED ORDER — VANCOMYCIN HCL 500 MG IV SOLR
500.0000 mg | Freq: Two times a day (BID) | INTRAVENOUS | Status: DC
  Administered 2016-01-05 – 2016-01-06 (×2): 500 mg via INTRAVENOUS
  Filled 2016-01-05 (×3): qty 500

## 2016-01-05 MED ORDER — ATORVASTATIN CALCIUM 80 MG PO TABS
80.0000 mg | ORAL_TABLET | Freq: Every day | ORAL | Status: DC
  Administered 2016-01-05 – 2016-01-09 (×5): 80 mg via ORAL
  Filled 2016-01-05 (×6): qty 1

## 2016-01-05 MED ORDER — PROPOFOL 1000 MG/100ML IV EMUL
5.0000 ug/kg/min | Freq: Once | INTRAVENOUS | Status: AC
  Administered 2016-01-05: 10 ug/kg/min via INTRAVENOUS

## 2016-01-05 MED ORDER — CHLORHEXIDINE GLUCONATE 0.12% ORAL RINSE (MEDLINE KIT)
15.0000 mL | Freq: Two times a day (BID) | OROMUCOSAL | Status: DC
  Administered 2016-01-05 – 2016-01-06 (×2): 15 mL via OROMUCOSAL

## 2016-01-05 MED ORDER — NITROGLYCERIN IN D5W 200-5 MCG/ML-% IV SOLN
5.0000 ug/min | Freq: Once | INTRAVENOUS | Status: DC
  Filled 2016-01-05: qty 250

## 2016-01-05 MED ORDER — MIDAZOLAM HCL 2 MG/2ML IJ SOLN
1.0000 mg | INTRAMUSCULAR | Status: DC | PRN
  Administered 2016-01-05: 2 mg via INTRAVENOUS

## 2016-01-05 MED ORDER — PROPOFOL 1000 MG/100ML IV EMUL
0.0000 ug/kg/min | INTRAVENOUS | Status: DC
  Administered 2016-01-05: 25 ug/kg/min via INTRAVENOUS

## 2016-01-05 MED ORDER — LABETALOL HCL 5 MG/ML IV SOLN: 20.0000 mg | INTRAVENOUS | Status: DC | PRN

## 2016-01-05 MED ORDER — ENOXAPARIN SODIUM 40 MG/0.4ML ~~LOC~~ SOLN
40.0000 mg | SUBCUTANEOUS | Status: DC
  Administered 2016-01-05: 40 mg via SUBCUTANEOUS
  Filled 2016-01-05 (×2): qty 0.4

## 2016-01-05 MED ORDER — IPRATROPIUM-ALBUTEROL 0.5-2.5 (3) MG/3ML IN SOLN
3.0000 mL | Freq: Four times a day (QID) | RESPIRATORY_TRACT | Status: DC
  Administered 2016-01-05 – 2016-01-08 (×14): 3 mL via RESPIRATORY_TRACT
  Filled 2016-01-05 (×14): qty 3

## 2016-01-05 MED ORDER — ETOMIDATE 2 MG/ML IV SOLN
INTRAVENOUS | Status: AC | PRN
Start: 2016-01-05 — End: 2016-01-05
  Administered 2016-01-05: 20 mg via INTRAVENOUS

## 2016-01-05 MED ORDER — ASPIRIN 300 MG RE SUPP
300.0000 mg | RECTAL | Status: AC
  Administered 2016-01-05: 300 mg via RECTAL
  Filled 2016-01-05: qty 1

## 2016-01-05 MED ORDER — VANCOMYCIN HCL IN DEXTROSE 1-5 GM/200ML-% IV SOLN
1000.0000 mg | Freq: Once | INTRAVENOUS | Status: AC
  Administered 2016-01-05: 1000 mg via INTRAVENOUS
  Filled 2016-01-05: qty 200

## 2016-01-05 MED ORDER — PROPOFOL 1000 MG/100ML IV EMUL
INTRAVENOUS | Status: AC
  Administered 2016-01-05: 10 ug/kg/min via INTRAVENOUS
  Filled 2016-01-05: qty 100

## 2016-01-05 MED ORDER — ROCURONIUM BROMIDE 50 MG/5ML IV SOLN
INTRAVENOUS | Status: AC | PRN
  Administered 2016-01-05: 80 mg via INTRAVENOUS

## 2016-01-05 MED ORDER — LIDOCAINE HCL (CARDIAC) 20 MG/ML IV SOLN
INTRAVENOUS | Status: AC | PRN
  Administered 2016-01-05: 100 mg via INTRAVENOUS

## 2016-01-05 MED ORDER — CARVEDILOL 6.25 MG PO TABS
6.2500 mg | ORAL_TABLET | Freq: Two times a day (BID) | ORAL | Status: DC
  Filled 2016-01-05: qty 1

## 2016-01-05 MED ORDER — MAGNESIUM SULFATE 2 GM/50ML IV SOLN
2.0000 g | Freq: Once | INTRAVENOUS | Status: AC
  Administered 2016-01-05: 2 g via INTRAVENOUS
  Filled 2016-01-05: qty 50

## 2016-01-05 MED ORDER — HYDRALAZINE HCL 20 MG/ML IJ SOLN: 10.0000 mg | INTRAMUSCULAR | Status: DC | PRN

## 2016-01-05 MED ORDER — FUROSEMIDE 10 MG/ML IJ SOLN
80.0000 mg | Freq: Two times a day (BID) | INTRAMUSCULAR | Status: DC
  Administered 2016-01-05 – 2016-01-06 (×2): 80 mg via INTRAVENOUS
  Filled 2016-01-05 (×3): qty 8

## 2016-01-05 MED ORDER — PIPERACILLIN-TAZOBACTAM 3.375 G IVPB
3.3750 g | Freq: Three times a day (TID) | INTRAVENOUS | Status: DC
  Administered 2016-01-05 – 2016-01-06 (×4): 3.375 g via INTRAVENOUS
  Filled 2016-01-05 (×6): qty 50

## 2016-01-05 MED ORDER — METHYLPREDNISOLONE SODIUM SUCC 125 MG IJ SOLR
125.0000 mg | Freq: Once | INTRAMUSCULAR | Status: AC
  Administered 2016-01-05: 125 mg via INTRAVENOUS
  Filled 2016-01-05: qty 2

## 2016-01-05 MED ORDER — PROPOFOL 1000 MG/100ML IV EMUL
5.0000 ug/kg/min | INTRAVENOUS | Status: DC
  Administered 2016-01-05: 10 ug/kg/min via INTRAVENOUS
  Filled 2016-01-05: qty 100

## 2016-01-05 MED ORDER — ANTISEPTIC ORAL RINSE SOLUTION (CORINZ)
7.0000 mL | Freq: Four times a day (QID) | OROMUCOSAL | Status: DC
  Administered 2016-01-05 – 2016-01-06 (×3): 7 mL via OROMUCOSAL

## 2016-01-05 MED ORDER — LABETALOL HCL 5 MG/ML IV SOLN
20.0000 mg | Freq: Once | INTRAVENOUS | Status: AC
  Administered 2016-01-05: 20 mg via INTRAVENOUS
  Filled 2016-01-05: qty 4

## 2016-01-05 MED ORDER — ASPIRIN 81 MG PO CHEW
81.0000 mg | CHEWABLE_TABLET | Freq: Every day | ORAL | Status: DC
  Administered 2016-01-06 – 2016-01-09 (×4): 81 mg via ORAL
  Filled 2016-01-05 (×4): qty 1

## 2016-01-05 NOTE — Progress Notes (Signed)
Pharmacy Antibiotic Note  Eddie [name] is a 65 y.o. male admitted on 01/05/2016 with pneumonia.  Pharmacy has been consulted for vancomycin and zosyn dosing.  Plan: Vancomycin 1000 mg x 1 then 500 mg IV every 12 hours.  Goal trough 15-20 mcg/mL. Zosyn 3.375g IV q8h (4 hour infusion).     Temp (24hrs), Avg:94.8 F (34.9 C), Min:94.8 F (34.9 C), Max:94.8 F (34.9 C)   Recent Labs Lab 01/05/16 0935 01/05/16 0948 01/05/16 1024  WBC  --   --  7.5  CREATININE 1.40*  --   --   LATICACIDVEN  --  5.55*  --     Estimated Creatinine Clearance: 48.1 mL/min (by C-G formula based on Cr of 1.4).    No Known Allergies  Antimicrobials this admission: Vancomycin 6/14>> Zosyn 6/14>>  Dose adjustments this admission: N/A  Microbiology results: 6/14 Blood x 2 Urine  Isaac Bliss, PharmD, BCPS, [facility] Clinical Pharmacist Pager [phone] 01/05/2016 11:40 AM

## 2016-01-05 NOTE — Progress Notes (Signed)
eLink Physician-Brief Progress Note Patient Name: Eddie [name] DOB: 1951/01/05 MRN: [id number]   Date of Service  01/05/2016  HPI/Events of Note  Called again about blood pressure from RN in setting of vent dyssynchrony Current MAP is 73  eICU Interventions  Advised that in setting of severe systolic heart failure MAP of 73 is acceptable and advised best to give sedation for vent synchrony now Added prn versed in addition to prn fentanyl order already ordered earlier     Intervention Category Intermediate Interventions: Hypotension - evaluation and management  Max Fickle 01/05/2016, 7:55 PM

## 2016-01-05 NOTE — H&P (Signed)
PULMONARY / CRITICAL CARE MEDICINE   Name: Eddie [name] MRN: [id number] DOB: [birth year]    ADMISSION DATE:  01/05/2016  REFERRING MD:  Colin Broach Jeneen Rinks)  CHIEF COMPLAINT:  Respiratory failure   HISTORY OF PRESENT ILLNESS:   65yo male with hx CAD, CVA, multiple recent admissions for acute respiratory failure (5/20-5/24 and 5/29-6/2) in the setting of decompensated CHF and EColi bacteremia.  He returned 6/14 after calling EMS with c/o SOB.  On EMS arrival he was awake and able to ambulate to their stretcher then became unresponsive and intubated with [facility] airway by EMS.  Reintubated with ETT on arrival to ER.  In ER had significant hypertension, ongoing AMS, mild AKI.    PAST MEDICAL HISTORY :  He  has a past medical history of PUD (peptic ulcer disease); CAD (coronary artery disease); and CVA (cerebral infarction).  PAST SURGICAL HISTORY: He  has past surgical history that includes Repair of Peptic Ulcer; Cardiac catheterization (N/A, 09/28/2015); and Cardiac catheterization (N/A, 10/08/2015).  No Known Allergies  No current facility-administered medications on file prior to encounter.   Current Outpatient Prescriptions on File Prior to Encounter  Medication Sig  . aspirin 81 MG chewable tablet Chew 1 tablet (81 mg total) by mouth daily.  Marland Kitchen atorvastatin (LIPITOR) 80 MG tablet Take 1 tablet (80 mg total) by mouth daily at 6 PM.  . blood glucose meter kit and supplies KIT Dispense based on patient and insurance preference. Use up to four times daily as directed. (FOR ICD-9 250.00, 250.01).  . carvedilol (COREG) 6.25 MG tablet Take 1 tablet (6.25 mg total) by mouth 2 (two) times daily with a meal.  . clopidogrel (PLAVIX) 75 MG tablet Take 4 tablets today then take 1 tablet by mouth daily after that (Patient taking differently: Take 75 mg by mouth daily. Take 4 tablets today then take 1 tablet by mouth daily after tha)  . furosemide (LASIX) 20 MG tablet Take 1 tablet (20 mg total) by mouth daily.   . Insulin Glargine (LANTUS) 100 UNIT/ML Solostar Pen Inject 10 Units into the skin daily at 10 pm.  . Insulin Pen Needle 31G X 5 MM MISC 1 each by Does not apply route 3 (three) times daily before meals.  Marland Kitchen ipratropium-albuterol (DUONEB) 0.5-2.5 (3) MG/3ML SOLN Take 3 mLs by nebulization every 2 (two) hours as needed. (Patient not taking: Reported on 12/21/2015)  . metFORMIN (GLUCOPHAGE) 850 MG tablet Take 1 tablet (850 mg total) by mouth daily with breakfast.  . nitroGLYCERIN (NITROSTAT) 0.4 MG SL tablet Use one pill every 5 minutes X 3 if needed for chest pain. Contact MD if pain does not improve (Patient taking differently: Place 0.4 mg under the tongue every 5 (five) minutes as needed for chest pain. Use one pill every 5 minutes X 3 if needed for chest pain. Contact MD if pain does not improve)  . polyethylene glycol (MIRALAX / GLYCOLAX) packet Take 17 g by mouth daily.  Marland Kitchen spironolactone (ALDACTONE) 25 MG tablet Take 0.5 tablets (12.5 mg total) by mouth daily.  Marland Kitchen warfarin (COUMADIN) 5 MG tablet Take 1 tablet daily or as directed by coumadin clinic (Patient taking differently: Take 5-7.5 mg by mouth daily. Take 1 1/2 tablet (7.5 mg) by mouth on Monday and Friday, take 1 tablet (5 mg) on Sunday, Tuesday, Wednesday, Thursday, Saturday)    FAMILY HISTORY:  His has no family status information on file.   SOCIAL HISTORY: He  reports that he has been smoking.  He does not have any smokeless tobacco history on file. He reports that he does not drink alcohol.  REVIEW OF SYSTEMS:   Unable, pt sedated on vent.  As per HPI obtained from RN and records.   SUBJECTIVE:    VITAL SIGNS: BP 190/108 mmHg  Pulse 96  Temp(Src) 94.8 F (34.9 C) (Core (Comment))  Resp 22  SpO2 98%  HEMODYNAMICS:    VENTILATOR SETTINGS: Vent Mode:  [-] PRVC FiO2 (%):  [100 %] 100 % Set Rate:  [12 bmp-20 bmp] 20 bmp Vt Set:  [500 mL] 500 mL PEEP:  [8 cmH20] 8 cmH20 Plateau Pressure:  [26 cmH20] 26 cmH20  INTAKE  / OUTPUT:    PHYSICAL EXAMINATION: General:  Chronically ill appearing male, NAD sedated on vent  Neuro:  Sedated on vent, RASS -2, Had GCS 9 on arrival per RN HEENT:  Mm moist, JVD +, pupils 29mm sluggish, ETT  Cardiovascular:  s1s2 rrr Lungs:  resps even non labored on vent, exp wheeze throughout, bibasilar crackles  Abdomen:  Round, soft, non tender Musculoskeletal:  Warm and dry, scant BLE edema   LABS:  BMET  Recent Labs Lab 01/05/16 0935  NA 138  K 4.9  CL 109  CO2 20*  BUN 11  CREATININE 1.40*  GLUCOSE 287*    Electrolytes  Recent Labs Lab 01/05/16 0935  CALCIUM 8.2*    CBC  Recent Labs Lab 01/05/16 1024  WBC 7.5  HGB 8.9*  HCT 31.0*  PLT 246    Coag's No results for input(s): APTT, INR in the last 168 hours.  Sepsis Markers  Recent Labs Lab 01/05/16 0948  LATICACIDVEN 5.55*    ABG No results for input(s): PHART, PCO2ART, PO2ART in the last 168 hours.  Liver Enzymes  Recent Labs Lab 01/05/16 0935  AST 43*  ALT 23  ALKPHOS 105  BILITOT 1.0  ALBUMIN 3.0*    Cardiac Enzymes No results for input(s): TROPONINI, PROBNP in the last 168 hours.  Glucose  Recent Labs Lab 01/05/16 0909  GLUCAP 224*    Imaging Ct Head Wo Contrast  01/05/2016  CLINICAL DATA:  Altered level of consciousness. History of prior CVA EXAM: CT HEAD WITHOUT CONTRAST TECHNIQUE: Contiguous axial images were obtained from the base of the skull through the vertex without intravenous contrast. COMPARISON:  Dec 11, 2015 FINDINGS: The ventricles are normal in size and configuration. There is no intracranial mass, hemorrhage, extra-axial fluid collection, or midline shift. There is a prior small lacunar infarct in the right corona radiata, stable. A tiny lacunar infarct in the anterior left corona radiata is stable as well. There is no new gray-white compartment lesion. No acute infarct evident. The bony calvarium appears intact. The mastoid air cells are clear.  Visualized orbits appear symmetric bilaterally. A small calcification in the anterior aspect of the right globe is stable. IMPRESSION: There is a small lacunar infarct in each corona radiata, stable. No new gray-white compartment lesion. No acute infarct. No hemorrhage or mass. Electronically Signed   By: Bretta Bang III M.D.   On: 01/05/2016 11:10   Dg Chest Portable 1 View  01/05/2016  CLINICAL DATA:  Hypoxia EXAM: PORTABLE CHEST 1 VIEW COMPARISON:  Dec 22, 2015 FINDINGS: Endotracheal tube tip is 5.3 cm above the carina. Nasogastric tube tip and side port are in this distal stomach. No pneumothorax. There is widespread alveolar edema, present diffusely but most pronounced in a perihilar distribution. There is also focal airspace consolidation, asymmetric, in the  right base compared to the left base. There is mild cardiomegaly with pulmonary venous hypertension. No adenopathy evident. IMPRESSION: Tube positions as described without pneumothorax. Findings indicative of congestive heart failure. Superimposed pneumonia, particularly in the right base, cannot be excluded. Both congestive heart failure and pneumonia may well be present at this time. Electronically Signed   By: Lowella Grip III M.D.   On: 01/05/2016 10:01     STUDIES:  CT head 6/14>>>   CULTURES: Sputum 6/14>>>  BC x 2 6/14>>>   ANTIBIOTICS: Vanc 6/14>>> Zosyn 6/14>>>  SIGNIFICANT EVENTS:   LINES/TUBES: ETT 6/14>>>  DISCUSSION: 65yo male with recurrent VDRF in setting decompensated CHF and pulmonary edema.   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  ??compliance at home.  Pulmonary edema  HTN crisis  CAD s/p DES  P:  BP control  Labetalol '20mg'$  IV x1 now   Continue diuresis -- lasix '80mg'$  IV given in ER  Trend troponin, BNP Will ask heart failure team to see  UDS  Will need CVL for CVP  Continue home asa, lipitor, coreg Heparin gtt per pharmacy for now, then transition back to home warfarin  RENAL AKI on CKD2- mild.  Baseline Scr ~1.1.  P:   F/u chem  Monitor with diuresis   GASTROINTESTINAL No active issue  P:   NPO  PPI  Consider TF if no plans for extubation in am   HEMATOLOGIC Anticoagulation needs  Anemia of chronic disease  P:  Check coags  F/u cbc  Heparin gtt   INFECTIOUS ?HCAP -- more likely pulmonary edema  Recent EColi UTI with bacteremia  P:   Vanc, zosyn as above  Trend pct  Pan culture   ENDOCRINE DM   P:   SSI   NEUROLOGIC AMS - r/t hypoxia, hypercarbia, HTN  Previous CVA march 2017 P:   RASS goal: -1 Propofol, PRN fentanyl for sedation  CT head neg    FAMILY  - Updates:  Wife updated at bedside 6/14 in ER   - Inter-disciplinary family meet or Palliative Care meeting due by:  6/21    Nickolas Madrid, NP 01/05/2016  11:32 AM Pager: [phone] or [phone]    He developed sudden onset of dyspnea, wheeze.  Progressed to VDRF with elevate BP.  RASS -3.  B/l wheeze.  HR regular.  Abd soft.  1+ edema.  CXR with pulmonary edema  CT head w/o acute findings  Lactic acid 5.55, Creatinine 1.40, Hb 8.9.  Assessment/plan:  Acute respiratory failure, hypoxia. Acute pulmonary edema. COPD exacerbation. Possible pneumonia. - full vent support - BDs, solumedrol - continue Abx for now  Acute on chronic combined CHF. - BP control  Updated pt's wife at bedside.  CC time by me independent of APP time 32 minutes.  Chesley Mires, MD [facility] Pulmonary/Critical Care 01/05/2016, 11:59 AM Pager:  [phone] After 3pm call: [phone]  - lasix - consult heart failure team

## 2016-01-05 NOTE — ED Provider Notes (Signed)
CSN: [id number]     Arrival date & time 01/05/16  0908 History   First MD Initiated Contact with Patient 01/05/16 [phone]     Chief Complaint  Patient presents with  . Respiratory Arrest     HPI  Patient presents for evaluation of acute shortness of breath starting suddenly at home this morning.  Patient was admitted recently, and discharged on June 2 with an admission for hypertensive urgency congestive heart failure.  Patient was admitted almost the entire month of March after presenting with ST elevation MI and cardiac arrest. Discharge with diagnosis of ischemic stroke, acute on chronic CHF, hypertension, hypertensive urgency, anoxic encephalopathy, cardiac arrest.  Today, his wife states that he was up and around. They were giving way to go somewhere. She states that he asked her to "get my breathing machine together".  Wife states that with some difficulty she was able to help him take a nebulized up-year-old treatment but he deteriorated. He was on his "hands and knees". Paramedics responded and found him tripoding, confused, hypoxemic. Given nebulized albuterol.  In route paramedics state the patient's eyes deviated to his left knee quit moving his left side and became "very tense". They do not describe any classic or stereotypical seizure activity. He was intubated using a [facility] airway.     Past Medical History  Diagnosis Date  . PUD (peptic ulcer disease)   . CAD (coronary artery disease)     a. STEMI 09/2015 w/ DES to Prox LAD  . CVA (cerebral infarction)     a. 09/2015: acute small right frontal lobe infarct   Past Surgical History  Procedure Laterality Date  . Repair of peptic ulcer    . Cardiac catheterization N/A 09/28/2015    Procedure: Left Heart Cath and Coronary Angiography;  Surgeon: Lorretta Harp, MD;  Location: [place] [facility];  Service: Cardiovascular;  Laterality: N/A;  . Cardiac catheterization N/A 10/08/2015    Procedure: Left Heart Cath and Coronary  Angiography;  Surgeon: Belva Crome, MD;  Location: [place] [facility];  Service: Cardiovascular;  Laterality: N/A;   History reviewed. No pertinent family history. Social History  Substance Use Topics  . Smoking status: Current Every Day Smoker -- 0.25 packs/day  . Smokeless tobacco: None  . Alcohol Use: No    Review of Systems  Unable to perform ROS: Intubated      Allergies  Review of patient's allergies indicates no known allergies.  Home Medications   Prior to Admission medications   Medication Sig Start Date End Date Taking? Authorizing Provider  aspirin 81 MG chewable tablet Chew 1 tablet (81 mg total) by mouth daily. 10/26/15   Bary Leriche, PA-C  atorvastatin (LIPITOR) 80 MG tablet Take 1 tablet (80 mg total) by mouth daily at 6 PM. 11/24/15   Minus Breeding, MD  blood glucose meter kit and supplies KIT Dispense based on patient and insurance preference. Use up to four times daily as directed. (FOR ICD-9 250.00, 250.01). 10/26/15   Ivan Anchors Love, PA-C  carvedilol (COREG) 6.25 MG tablet Take 1 tablet (6.25 mg total) by mouth 2 (two) times daily with a meal. 11/24/15   Minus Breeding, MD  clopidogrel (PLAVIX) 75 MG tablet Take 4 tablets today then take 1 tablet by mouth daily after that Patient taking differently: Take 75 mg by mouth daily. Take 4 tablets today then take 1 tablet by mouth daily after tha 10/28/15   Evelene Croon [name], PA-C  furosemide (LASIX)  20 MG tablet Take 1 tablet (20 mg total) by mouth daily. 12/25/15   Rolly Salter, MD  Insulin Glargine (LANTUS) 100 UNIT/ML Solostar Pen Inject 10 Units into the skin daily at 10 pm. 12/24/15   Rolly Salter, MD  Insulin Pen Needle 31G X 5 MM MISC 1 each by Does not apply route 3 (three) times daily before meals. 12/24/15   Rolly Salter, MD  ipratropium-albuterol (DUONEB) 0.5-2.5 (3) MG/3ML SOLN Take 3 mLs by nebulization every 2 (two) hours as needed. Patient not taking: Reported on 12/21/2015 12/15/15   Dorothea Ogle, MD  metFORMIN  (GLUCOPHAGE) 850 MG tablet Take 1 tablet (850 mg total) by mouth daily with breakfast. 10/26/15   Evlyn Kanner Love, PA-C  nitroGLYCERIN (NITROSTAT) 0.4 MG SL tablet Use one pill every 5 minutes X 3 if needed for chest pain. Contact MD if pain does not improve Patient taking differently: Place 0.4 mg under the tongue every 5 (five) minutes as needed for chest pain. Use one pill every 5 minutes X 3 if needed for chest pain. Contact MD if pain does not improve 10/26/15   Evlyn Kanner Love, PA-C  polyethylene glycol (MIRALAX / GLYCOLAX) packet Take 17 g by mouth daily. 12/24/15   Rolly Salter, MD  spironolactone (ALDACTONE) 25 MG tablet Take 0.5 tablets (12.5 mg total) by mouth daily. 10/26/15   Jacquelynn Cree, PA-C  warfarin (COUMADIN) 5 MG tablet Take 1 tablet daily or as directed by coumadin clinic Patient taking differently: Take 5-7.5 mg by mouth daily. Take 1 1/2 tablet (7.5 mg) by mouth on Monday and Friday, take 1 tablet (5 mg) on Sunday, Tuesday, Wednesday, Thursday, Saturday 10/28/15   Joline Salt [name], PA-C   BP 190/108 mmHg  Pulse 96  Temp(Src) 94.8 F (34.9 C) (Core (Comment))  Resp 22  SpO2 98% Physical Exam  Constitutional: He is oriented to person, place, and time. He appears cachectic. He has a sickly appearance. No distress. He is intubated.  HENT:  Head: Normocephalic.  Eyes: Conjunctivae are normal. Pupils are equal, round, and reactive to light. No scleral icterus.  Neck: Normal range of motion. Neck supple. No thyromegaly present.  Cardiovascular: Normal rate and regular rhythm.  Exam reveals no gallop and no friction rub.   No murmur heard. Pulmonary/Chest: Effort normal and breath sounds normal. He is intubated. No respiratory distress. He has no wheezes. He has no rales.  Tachypneic. Undergoing bag assisted ventilations. Diffuse rhonchi and prolongation in all fields.  Abdominal: Soft. Bowel sounds are normal. He exhibits no distension. There is no tenderness. There is no rebound.   Musculoskeletal: Normal range of motion.  Neurological: He is oriented to person, place, and time. He is unresponsive.  Skin: Skin is warm and dry. No rash noted.  Psychiatric: He has a normal mood and affect. His behavior is normal.    ED Course  Procedures (including critical care time) Labs Review Labs Reviewed  COMPREHENSIVE METABOLIC PANEL - Abnormal; Notable for the following:    CO2 20 (*)    Glucose, Bld 287 (*)    Creatinine, Ser 1.40 (*)    Calcium 8.2 (*)    Albumin 3.0 (*)    AST 43 (*)    GFR calc non Af Amer 52 (*)    GFR calc Af Amer 60 (*)    All other components within normal limits  URINALYSIS, ROUTINE W REFLEX MICROSCOPIC (NOT AT [facility]) - Abnormal; Notable for the following:  APPearance HAZY (*)    Glucose, UA 100 (*)    Hgb urine dipstick SMALL (*)    Protein, ur 100 (*)    Leukocytes, UA MODERATE (*)    All other components within normal limits  CBC - Abnormal; Notable for the following:    RBC 3.52 (*)    Hemoglobin 8.9 (*)    HCT 31.0 (*)    MCH 25.3 (*)    MCHC 28.7 (*)    All other components within normal limits  URINE MICROSCOPIC-ADD ON - Abnormal; Notable for the following:    Squamous Epithelial / LPF 0-5 (*)    Bacteria, UA RARE (*)    All other components within normal limits  I-STAT CG4 LACTIC ACID, ED - Abnormal; Notable for the following:    Lactic Acid, Venous 5.55 (*)    All other components within normal limits  CBG MONITORING, ED - Abnormal; Notable for the following:    Glucose-Capillary 224 (*)    All other components within normal limits  CULTURE, BLOOD (ROUTINE X 2)  CULTURE, BLOOD (ROUTINE X 2)  PROTIME-INR  URINE RAPID DRUG SCREEN, HOSP PERFORMED  I-STAT TROPOININ, ED  I-STAT ARTERIAL BLOOD GAS, ED    Imaging Review Ct Head Wo Contrast  01/05/2016  CLINICAL DATA:  Altered level of consciousness. History of prior CVA EXAM: CT HEAD WITHOUT CONTRAST TECHNIQUE: Contiguous axial images were obtained from the base of the  skull through the vertex without intravenous contrast. COMPARISON:  Dec 11, 2015 FINDINGS: The ventricles are normal in size and configuration. There is no intracranial mass, hemorrhage, extra-axial fluid collection, or midline shift. There is a prior small lacunar infarct in the right corona radiata, stable. A tiny lacunar infarct in the anterior left corona radiata is stable as well. There is no new gray-white compartment lesion. No acute infarct evident. The bony calvarium appears intact. The mastoid air cells are clear. Visualized orbits appear symmetric bilaterally. A small calcification in the anterior aspect of the right globe is stable. IMPRESSION: There is a small lacunar infarct in each corona radiata, stable. No new gray-white compartment lesion. No acute infarct. No hemorrhage or mass. Electronically Signed   By: Lowella Grip III M.D.   On: 01/05/2016 11:10   Dg Chest Portable 1 View  01/05/2016  CLINICAL DATA:  Hypoxia EXAM: PORTABLE CHEST 1 VIEW COMPARISON:  Dec 22, 2015 FINDINGS: Endotracheal tube tip is 5.3 cm above the carina. Nasogastric tube tip and side port are in this distal stomach. No pneumothorax. There is widespread alveolar edema, present diffusely but most pronounced in a perihilar distribution. There is also focal airspace consolidation, asymmetric, in the right base compared to the left base. There is mild cardiomegaly with pulmonary venous hypertension. No adenopathy evident. IMPRESSION: Tube positions as described without pneumothorax. Findings indicative of congestive heart failure. Superimposed pneumonia, particularly in the right base, cannot be excluded. Both congestive heart failure and pneumonia may well be present at this time. Electronically Signed   By: Lowella Grip III M.D.   On: 01/05/2016 10:01   I have personally reviewed and evaluated these images and lab results as part of my medical decision-making.   EKG Interpretation None      MDM   Final  diagnoses:  None   Angiocath insertion Performed by: Lolita Patella  Consent: Verbal consent obtained. Risks and benefits: risks, benefits and alternatives were discussed Time out: Immediately prior to procedure a "time out" was called to verify the  correct patient, procedure, equipment, support staff and site/side marked as required.  Preparation: Patient was prepped and draped in the usual sterile fashion.  Vein Location: Lt EJ  Gauge: 18  Normal blood return and flush without difficulty Patient tolerance: Patient tolerated the procedure well with no immediate complications.     CRITICAL CARE Performed by: Tanna Furry JOSEPH   Total critical care time: 60 minutes  Critical care time was exclusive of separately billable procedures and treating other patients.  Critical care was necessary to treat or prevent imminent or life-threatening deterioration.  Critical care was time spent personally by me on the following activities: development of treatment plan with patient and/or surrogate as well as nursing, discussions with consultants, evaluation of patient's response to treatment, examination of patient, obtaining history from patient or surrogate, ordering and performing treatments and interventions, ordering and review of laboratory studies, ordering and review of radiographic studies, pulse oximetry and re-evaluation of patient's condition.  INTUBATION Performed by: Lolita Patella  Required items: required blood products, implants, devices, and special equipment available Patient identity confirmed: provided demographic data and hospital-assigned identification number Time out: Immediately prior to procedure a "time out" was called to verify the correct patient, procedure, equipment, support staff and site/side marked as required.  Unable to visualize cords using guide scope. On first attempt with direct laryngoscopy was able to visualize cords. 8.0 ET tube unable  to pass. Patient was bag ventilated on second attempt using DL was able to visualize cords and passed tube.   Indications: respiratory failure  Intubation method: direct Laryngoscopy   Preoxygenation: =BVM  Sedatives: 20 mgEtomidate Paralytic: Rock erroneous 80 mg   Tube Size:  cuffed  Post-procedure assessment: chest rise and ETCO2 monitor Breath sounds: equal and absent over the epigastrium Tube secured with: ETT holder Chest x-ray interpreted by radiologist and me.  Chest x-ray findings: +endotracheal tube in appropriate position  Patient tolerated the procedure well with no immediate complications.  When exchanging the [facility] airway, I attempted a neurological exam on the patient:   he is moving both upper and both lower extremities although using the left upper actually somewhat less. GCS is 3+1+5 = 9.  However, he has poor respiratory effort and poor air exchange and does require reintubation with endotracheal tube.  Clinically and radiographically, patient appears to be in acute congestive heart failure. Per his wife's description of feeling well and attempting to leave the home with a sudden event this is likely flash pulmonary edema as his primary insult. His pneumonia may be new, or persistent from his most recent admission.   Following intubation  his pressures remained high. Was placed on propofol. Pressure still remained high. Started on nitroglycerin. Given Lasix IV.  Patient also treated with advised albuterol via his endotracheal tube. Given IV magnesium sulfate. Given site Medrol. Cultures obtained. Given vancomycin and Maxipime. Only catheter placed. Cor temp is low. Lactate of 5.5. Not surprising considering his long episodes of hypoxemia. MVC 7.5. He will 8.9. Creatinine 1.4.  CT head obtained because of the event described by paramedic. No sign of hemorrhage.  Critical care team is here planning admission. Placing central line.    Tanna Furry, MD 01/05/16 1224

## 2016-01-05 NOTE — Progress Notes (Signed)
eLink Physician-Brief Progress Note Patient Name: Eddie [name] DOB: [birth year] MRN: [id number]   Date of Service  01/05/2016  HPI/Events of Note  Hypotension Severe CHF baseline Likely cardiogenic shock Diuresing well  eICU Interventions  Stop propofol as this is contributing to hypotension Change to fentanyl prn     Intervention Category Major Interventions: Hypotension - evaluation and management  Max Fickle 01/05/2016, 6:04 PM

## 2016-01-05 NOTE — ED Notes (Signed)
Report given.

## 2016-01-05 NOTE — Progress Notes (Signed)
Responded to page to support patient who reported to ED via EMS. Per staff pt.experienced respiratory distress that later turned to respiratory arrest and was intubated. Patient wife arrived was escorted to consultation room.  I accomplished EDP to update patients wife on Mr. Maritato status. Patient going to CT and will be admitted to ICU. I provided ministry of presence and , hospitality, listening and emotional support. Will follow as needed.   01/05/16 1000  Clinical Encounter Type  Visited With Family;Patient not available;Health care provider  Visit Type Initial;Spiritual support;ED;Trauma  Referral From Nurse  Spiritual Encounters  Spiritual Needs Emotional  Stress Factors  Family Stress Factors Health changes  Venida Jarvis, Chaplain,pager [phone]

## 2016-01-05 NOTE — Progress Notes (Signed)
eLink Physician-Brief Progress Note Patient Name: Eddie [name] DOB: January 31, 1951 MRN: [id number]   Date of Service  01/05/2016  HPI/Events of Note  DVT prophylaxis  eICU Interventions  lovenox     Intervention Category Intermediate Interventions: Best-practice therapies (e.g. DVT, beta blocker, etc.)  Max Fickle 01/05/2016, 5:19 PM

## 2016-01-05 NOTE — ED Notes (Signed)
CBG 224 

## 2016-01-05 NOTE — Procedures (Signed)
Central Venous Catheter Insertion Procedure Note Eddie [name] [id number] 25-Mar-1951  Procedure: Insertion of Central Venous Catheter Indications: Assessment of intravascular volume and Drug and/or fluid administration  Procedure Details Consent: Risks of procedure as well as the alternatives and risks of each were explained to the (patient/caregiver).  Consent for procedure obtained. Time Out: Verified patient identification, verified procedure, site/side was marked, verified correct patient position, special equipment/implants available, medications/allergies/relevent history reviewed, required imaging and test results available.  Performed  Maximum sterile technique was used including antiseptics, cap, gloves, gown, hand hygiene, mask and sheet. Skin prep: Chlorhexidine; local anesthetic administered A antimicrobial bonded/coated triple lumen catheter was placed in the right internal jugular vein using the Seldinger technique.  Evaluation Blood flow good Complications: No apparent complications Patient did tolerate procedure well. Chest X-ray ordered to verify placement.  CXR: pending.  Placed by Lorette Ang NP under my supervision  Performed using ultrasound guidance.  Wire visualized in vessel under ultrasound.   Dirk Dress, NP 01/05/2016  12:09 PM Pager: [phone] or [phone]

## 2016-01-05 NOTE — Consult Note (Signed)
Advanced Heart Failure Team Consult Note  Referring Physician: Dr Halford Chessman  Primary Physician: Dr Jeanie Cooks Primary Cardiologist:  Dr Gwenlyn Found   Reason for Consultation: A/C Systolic   HPI:   Mr Portela is a 65 year old with history of PUD, CAD, CVA, PEA arrest 12/2561, chronic systolic heart failure, and DM II.    Admitted 5 times over the last 6 months.   Admitted to 09/28/15 with CP and anterior STEMI. Required intubation. Had PEA arrest . Had LHC with DES to LAD. Post cath had Lsided weakness. MRI of brain showed R frontal lobe infarct. Later transferred to [facility] for rehab. Not discharged on diuretics.   Admitted 12/11/15 with increased dyspnea, HTN and lactic acidosis. Required short term intubation secondary to pulmonary edema.  D/C weight 162 pounds. Not discharged on diuretics.    Admitted 12/20/15  with increased dyspnea and HTN. Required short term intubation and diuresis. D/C weight 151 pounds. Discharged on 20 mg po lasix.  Earlier today he presented to ED via EMS with increased dyspnea. Initially awake but became unresponsive. Intubated and central line was placed. HTN and given labetalol. Started on 80 mg IV lasix. CXR concerning for HF and pneumonia. Pertinent admission labs include: troponin 0.05, lactic acid 5.55, K 4.9, Creatinine 1.4, WBC 7.5 BNP 1149, and Hgb 8.9.  Started on vanc and zosyn.   LHC 3/717-->DES LAD Repeat LHC 10/08/15  Patent LAD stent.  1. Ost 1st Mrg to 1st Mrg lesion, 80% stenosed. 2. Prox LAD-2 lesion, 50% stenosed. 3. Mid RCA lesion, 65% stenosed.   ECHO 09/2015 15-20%.  RV normal                                         Review of Systems: From chart. Intubated with sedation General: Weight gain _0 ; Weight loss _1 ; Anorexia _2 ; Fatigue _3 ; Fever _4 ; Chills _5 ; Weakness _6   Cardiac: Chest pain/pressure _7 ; Resting SOB _8 ; Exertional SOB _9 ; Orthopnea _10 ; Pedal Edema _11 ; Palpitations _12 ; Syncope _13 ; Presyncope _14 ; Paroxysmal nocturnal dyspnea_15    Pulmonary: Cough _16 ; Wheezing_17 ; Hemoptysis_18 ; Sputum _19 ; Snoring _20   GI: Vomiting_21 ; Dysphagia_22 ; Melena_23 ; Hematochezia _24 ; Heartburn_25 ; Abdominal pain _26 ; Constipation _27 ; Diarrhea _28 ; BRBPR _29   GU: Hematuria_30 ; Dysuria _31 ; Nocturia_32   Vascular: Pain in legs with walking _33 ; Pain in feet with lying flat _34 ; Non-healing sores _35 ; Stroke _36 ; TIA _37 ; Slurred speech _38 ;  Neuro: Headaches_39 ; Vertigo_40 ; Seizures_41 ; Paresthesias_42 ;Blurred vision _43 ; Diplopia _44 ; Vision changes _45   Ortho/Skin: Arthritis _46 ; Joint pain _47 ; Muscle pain _48 ; Joint swelling _49 ; Back Pain _50 ; Rash _51   Psych: Depression_52 ; Anxiety_53   Heme: Bleeding problems _54 ; Clotting disorders _55 ; Anemia _56   Endocrine: Diabetes _57 ; Thyroid dysfunction_58   Home Medications Prior to Admission medications   Medication Sig Start Date End Date Taking? Authorizing Provider  aspirin 81 MG chewable tablet Chew 1 tablet (81 mg total) by mouth daily. 10/26/15   Bary Leriche, PA-C  atorvastatin (LIPITOR) 80 MG tablet Take 1 tablet (80 mg total) by mouth daily at 6 PM. 11/24/15   Minus Breeding, MD  blood  glucose meter kit and supplies KIT Dispense based on patient and insurance preference. Use up to four times daily as directed. (FOR ICD-9 250.00, 250.01). 10/26/15   Ivan Anchors Love, PA-C  carvedilol (COREG) 6.25 MG tablet Take 1 tablet (6.25 mg total) by mouth 2 (two) times daily with a meal. 11/24/15   Minus Breeding, MD  clopidogrel (PLAVIX) 75 MG tablet Take 4 tablets today then take 1 tablet by mouth daily after that Patient taking differently: Take 75 mg by mouth daily. Take 4 tablets today then take 1 tablet by mouth daily after tha 10/28/15   Suanne Marker G [name], PA-C  furosemide (LASIX) 20 MG tablet Take 1 tablet (20 mg total) by mouth daily. 12/25/15   Lavina Hamman, MD  Insulin Glargine (LANTUS) 100 UNIT/ML Solostar Pen Inject 10 Units into the skin daily at 10 pm. 12/24/15   Lavina Hamman, MD  Insulin Pen  Needle 31G X 5 MM MISC 1 each by Does not apply route 3 (three) times daily before meals. 12/24/15   Lavina Hamman, MD  ipratropium-albuterol (DUONEB) 0.5-2.5 (3) MG/3ML SOLN Take 3 mLs by nebulization every 2 (two) hours as needed. Patient not taking: Reported on 12/21/2015 12/15/15   Theodis Blaze, MD  metFORMIN (GLUCOPHAGE) 850 MG tablet Take 1 tablet (850 mg total) by mouth daily with breakfast. 10/26/15   Ivan Anchors Love, PA-C  nitroGLYCERIN (NITROSTAT) 0.4 MG SL tablet Use one pill every 5 minutes X 3 if needed for chest pain. Contact MD if pain does not improve Patient taking differently: Place 0.4 mg under the tongue every 5 (five) minutes as needed for chest pain. Use one pill every 5 minutes X 3 if needed for chest pain. Contact MD if pain does not improve 10/26/15   Ivan Anchors Love, PA-C  polyethylene glycol (MIRALAX / GLYCOLAX) packet Take 17 g by mouth daily. 12/24/15   Lavina Hamman, MD  spironolactone (ALDACTONE) 25 MG tablet Take 0.5 tablets (12.5 mg total) by mouth daily. 10/26/15   Bary Leriche, PA-C  warfarin (COUMADIN) 5 MG tablet Take 1 tablet daily or as directed by coumadin clinic Patient taking differently: Take 5-7.5 mg by mouth daily. Take 1 1/2 tablet (7.5 mg) by mouth on Monday and Friday, take 1 tablet (5 mg) on Sunday, Tuesday, Wednesday, Thursday, Saturday 10/28/15   Lonn Georgia, PA-C    Past Medical History: Past Medical History  Diagnosis Date  . PUD (peptic ulcer disease)   . CAD (coronary artery disease)     a. STEMI 09/2015 w/ DES to Prox LAD  . CVA (cerebral infarction)     a. 09/2015: acute small right frontal lobe infarct    Past Surgical History: Past Surgical History  Procedure Laterality Date  . Repair of peptic ulcer    . Cardiac catheterization N/A 09/28/2015    Procedure: Left Heart Cath and Coronary Angiography;  Surgeon: Lorretta Harp, MD;  Location: [facility];  Service: Cardiovascular;  Laterality: N/A;  . Cardiac catheterization N/A  10/08/2015    Procedure: Left Heart Cath and Coronary Angiography;  Surgeon: Belva Crome, MD;  Location: [place];  Service: Cardiovascular;  Laterality: N/A;    Family History: History reviewed. No pertinent family history.  Social History: Social History   Social History  . Marital Status: Married    Spouse Name: N/A  . Number of Children: N/A  . Years of Education: N/A   Social History Main  Topics  . Smoking status: Current Every Day Smoker -- 0.25 packs/day  . Smokeless tobacco: None  . Alcohol Use: No  . Drug Use: None  . Sexual Activity: Not Asked   Other Topics Concern  . None   Social History Narrative    Allergies:  No Known Allergies  Objective:    Vital Signs:   Temp:  [94.8 F (34.9 C)] 94.8 F (34.9 C) (06/14 1013) Pulse Rate:  [96] 96 (06/14 0930) Resp:  [22] 22 (06/14 0930) BP: (190)/(108) 190/108 mmHg (06/14 0930) SpO2:  [85 %-98 %] 98 % (06/14 1108) FiO2 (%):  [100 %] 100 % (06/14 0933)    Weight change: There were no vitals filed for this visit.  Intake/Output:   Intake/Output Summary (Last 24 hours) at 01/05/16 1235 Last data filed at 01/05/16 1004  Gross per 24 hour  Intake      0 ml  Output     70 ml  Net    -70 ml     Physical Exam: General:  Intubated sedated but opens eyes.  HEENT: normal Neck: supple. JVP difficutl to assess with central line and IV on left.  . Carotids 2+ bilat; no bruits. No lymphadenopathy or thryomegaly appreciated. Cor: PMI nondisplaced. Regular rate & rhythm. No rubs, gallops or murmurs. Lungs: coarse throughout  Abdomen: soft, nontender, nondistended. No hepatosplenomegaly. No bruits or masses. Good bowel sounds. Extremities: no cyanosis, clubbing, rash, R and LLE cool 1+ edema Neuro: intubated  Telemetry: NSR 70s   Labs: Basic Metabolic Panel:  Recent Labs Lab 01/05/16 0935  NA 138  K 4.9  CL 109  CO2 20*  GLUCOSE 287*  BUN 11  CREATININE 1.40*  CALCIUM 8.2*    Liver  Function Tests:  Recent Labs Lab 01/05/16 0935  AST 43*  ALT 23  ALKPHOS 105  BILITOT 1.0  PROT 7.4  ALBUMIN 3.0*   No results for input(s): LIPASE, AMYLASE in the last 168 hours. No results for input(s): AMMONIA in the last 168 hours.  CBC:  Recent Labs Lab 01/05/16 1024  WBC 7.5  HGB 8.9*  HCT 31.0*  MCV 88.1  PLT 246    Cardiac Enzymes: No results for input(s): CKTOTAL, CKMB, CKMBINDEX, TROPONINI in the last 168 hours.  BNP: BNP (last 3 results)  Recent Labs  12/11/15 0720 12/20/15 1928  BNP 492.7* 1475.7*    ProBNP (last 3 results) No results for input(s): PROBNP in the last 8760 hours.   CBG:  Recent Labs Lab 01/05/16 0909  GLUCAP 224*    Coagulation Studies: No results for input(s): LABPROT, INR in the last 72 hours.  Other results: EKG: NSR 91 bpm LVH strain.  Imaging: Ct Head Wo Contrast  01/05/2016  CLINICAL DATA:  Altered level of consciousness. History of prior CVA EXAM: CT HEAD WITHOUT CONTRAST TECHNIQUE: Contiguous axial images were obtained from the base of the skull through the vertex without intravenous contrast. COMPARISON:  Dec 11, 2015 FINDINGS: The ventricles are normal in size and configuration. There is no intracranial mass, hemorrhage, extra-axial fluid collection, or midline shift. There is a prior small lacunar infarct in the right corona radiata, stable. A tiny lacunar infarct in the anterior left corona radiata is stable as well. There is no new gray-white compartment lesion. No acute infarct evident. The bony calvarium appears intact. The mastoid air cells are clear. Visualized orbits appear symmetric bilaterally. A small calcification in the anterior aspect of the right globe is stable. IMPRESSION: There  is a small lacunar infarct in each corona radiata, stable. No new gray-white compartment lesion. No acute infarct. No hemorrhage or mass. Electronically Signed   By: Lowella Grip III M.D.   On: 01/05/2016 11:10   Dg Chest  Portable 1 View  01/05/2016  CLINICAL DATA:  Hypoxia EXAM: PORTABLE CHEST 1 VIEW COMPARISON:  Dec 22, 2015 FINDINGS: Endotracheal tube tip is 5.3 cm above the carina. Nasogastric tube tip and side port are in this distal stomach. No pneumothorax. There is widespread alveolar edema, present diffusely but most pronounced in a perihilar distribution. There is also focal airspace consolidation, asymmetric, in the right base compared to the left base. There is mild cardiomegaly with pulmonary venous hypertension. No adenopathy evident. IMPRESSION: Tube positions as described without pneumothorax. Findings indicative of congestive heart failure. Superimposed pneumonia, particularly in the right base, cannot be excluded. Both congestive heart failure and pneumonia may well be present at this time. Electronically Signed   By: Lowella Grip III M.D.   On: 01/05/2016 10:01      Medications:     Current Medications: . aspirin  324 mg Oral NOW   Or  . aspirin  300 mg Rectal NOW  . aspirin  81 mg Oral Daily  . atorvastatin  80 mg Oral q1800  . carvedilol  6.25 mg Oral BID WC  . insulin aspart  0-15 Units Subcutaneous Q4H  . ipratropium-albuterol  3 mL Nebulization Q6H  . methylPREDNISolone (SOLU-MEDROL) injection  40 mg Intravenous Q12H  . nitroGLYCERIN  5-200 mcg/min Intravenous Once     Infusions: . famotidine (PEPCID) IV    . piperacillin-tazobactam (ZOSYN)  IV    . propofol (DIPRIVAN) infusion    . [START ON 01/06/2016] vancomycin    . vancomycin 1,000 mg (01/05/16 1228)      Assessment:  1. Acute Respiratory Failure 2. 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 ? HCAP 4. CAD- 09/2015 DES to LAD  5. CVA 09/2015    Plan/Discussion:   Mr Brooking is 65 year old with ICM admitted with acute respiratory failure, a/c systolic heart, and ?HCAP. CXR concerning for HF versus HCAP. On antibiotics per CCM. Most recent ECHO March 2017,EF 15-20% with normal RV.   Cycling troponin. 0.05>  0.08. Diurese with 80 mg IV twice a day. Already received 80 mg IV lasix and will give another dose today.  Set up CVP. Check CO-OX now. Based on results may need inotropes.  No bb for now. Add prn hydralazine if needed. Follow renal function closely.   DES LAD March 2017. On aspirin and statin. Restart plavix.     Length of Stay: 0  Amy Clegg NP-C  01/05/2016, 12:35 PM  Advanced Heart Failure Team Pager [phone] (M-F; 7a - 4p)  Please contact [facility] for night-coverage after hours (4p -7a ) and weekends on [email or web address]   Patient seen and examined with Darrick Grinder, NP. We discussed all aspects of the encounter. I agree with the assessment and plan as stated above.   Patient with severe HF s/p anterior MI several months ago c/b CVA. Has had several recent admits for respiratory failure. Again admitted with respiratory failure and shock. Suspect mostly cardiogenic in nature. Now awake on vent. Responding to commands. Initial co-ox marginal 58%. Diuresing with IV lasix. BP marginal. Will continue IV lasix. Repeat co-ox at 8pm. Low threshold to add inotropic therapy. No evidence of ACS. Resume plavix for recent stent.  Once extubated will need to better evaluate his  functional status and social situation to see if he is a candidate for advanced therapies for his HF. Will likely need RHC. Will also need f/u in HF Clinic to try and break cycle of readmits for him.   The patient is critically ill with multiple organ systems failure and requires high complexity decision making for assessment and support, frequent evaluation and titration of therapies, application of advanced monitoring technologies and extensive interpretation of multiple databases.   Critical Care Time devoted to patient care services described in this note is 35 Minutes.  Kobee Medlen,MD 6:48 PM

## 2016-01-05 NOTE — ED Notes (Signed)
Pt here from home with resp arrest , pt originally awake tripod position sats 74% pt up to strcher for ems , in route pt had a syncopal type event  And a king airway was placed ,pt arrived with king airway in place

## 2016-01-05 NOTE — Progress Notes (Signed)
eLink Physician-Brief Progress Note Patient Name: Eddie [name] DOB: 04-02-1951 MRN: [id number]   Date of Service  01/05/2016  HPI/Events of Note  Asked to clarify need for anticoagulation and answer, what is indication.  eICU Interventions  Chart reviewed extensively.  It is not clear that there was ever really a clear indication for the anticoagulation after the stroke in 09/2015.  Will hold off on heparin gtt. Day team may want to discuss need for anticoagulation with cardiology on 6/15.     Intervention Category Intermediate Interventions: Coagulopathy - evaluation and management  Max Fickle 01/05/2016, 5:11 PM

## 2016-01-05 NOTE — Progress Notes (Signed)
Tonye Becket NP was notified of co-ox results.  No new orders

## 2016-01-05 NOTE — ED Notes (Signed)
D/c'd king airway  Pt with gcs of 9 ,pt unable to tolerate  MD to intubate

## 2016-01-05 NOTE — Progress Notes (Addendum)
Pharmacy note: lovenox  65 yo male with VDRF and HF. Pharmacy consulted to dose lovenox for VTE prophylaxis -Hg= 8.9, plt= 246, SCr= 1.4 and CrCl ~ 50.  Plan -Lovenox 40mg  sq q24hr -Will follow patient progress while on antibiotics  Thank you

## 2016-01-06 ENCOUNTER — Ambulatory Visit: Payer: BLUE CROSS/BLUE SHIELD | Admitting: Physical Medicine & Rehabilitation

## 2016-01-06 ENCOUNTER — Inpatient Hospital Stay (HOSPITAL_COMMUNITY): Payer: BLUE CROSS/BLUE SHIELD

## 2016-01-06 ENCOUNTER — Encounter: Payer: BLUE CROSS/BLUE SHIELD | Attending: Physical Medicine & Rehabilitation

## 2016-01-06 DIAGNOSIS — I5023 Acute on chronic systolic (congestive) heart failure: Secondary | ICD-10-CM

## 2016-01-06 DIAGNOSIS — J9602 Acute respiratory failure with hypercapnia: Secondary | ICD-10-CM

## 2016-01-06 DIAGNOSIS — K279 Peptic ulcer, site unspecified, unspecified as acute or chronic, without hemorrhage or perforation: Secondary | ICD-10-CM | POA: Insufficient documentation

## 2016-01-06 DIAGNOSIS — R1313 Dysphagia, pharyngeal phase: Secondary | ICD-10-CM | POA: Insufficient documentation

## 2016-01-06 DIAGNOSIS — R498 Other voice and resonance disorders: Secondary | ICD-10-CM | POA: Insufficient documentation

## 2016-01-06 DIAGNOSIS — R41841 Cognitive communication deficit: Secondary | ICD-10-CM | POA: Insufficient documentation

## 2016-01-06 DIAGNOSIS — I2510 Atherosclerotic heart disease of native coronary artery without angina pectoris: Secondary | ICD-10-CM | POA: Insufficient documentation

## 2016-01-06 LAB — BASIC METABOLIC PANEL
ANION GAP: 12 (ref 5–15)
BUN: 22 mg/dL — AB (ref 6–20)
CHLORIDE: 102 mmol/L (ref 101–111)
CO2: 25 mmol/L (ref 22–32)
Calcium: 8.4 mg/dL — ABNORMAL LOW (ref 8.9–10.3)
Creatinine, Ser: 1.59 mg/dL — ABNORMAL HIGH (ref 0.61–1.24)
GFR calc Af Amer: 51 mL/min — ABNORMAL LOW (ref >60)
GFR calc non Af Amer: 44 mL/min — ABNORMAL LOW (ref >60)
GLUCOSE: 151 mg/dL — AB (ref 65–99)
POTASSIUM: 3.6 mmol/L (ref 3.5–5.1)
Sodium: 139 mmol/L (ref 135–145)

## 2016-01-06 LAB — BLOOD CULTURE ID PANEL (REFLEXED)
ACINETOBACTER BAUMANNII: NOT DETECTED
CANDIDA ALBICANS: NOT DETECTED
CANDIDA GLABRATA: NOT DETECTED
Candida krusei: NOT DETECTED
Candida parapsilosis: NOT DETECTED
Candida tropicalis: NOT DETECTED
Carbapenem resistance: NOT DETECTED
ENTEROBACTER CLOACAE COMPLEX: NOT DETECTED
ESCHERICHIA COLI: NOT DETECTED
Enterobacteriaceae species: NOT DETECTED
Enterococcus species: NOT DETECTED
HAEMOPHILUS INFLUENZAE: NOT DETECTED
Klebsiella oxytoca: NOT DETECTED
Klebsiella pneumoniae: NOT DETECTED
LISTERIA MONOCYTOGENES: NOT DETECTED
METHICILLIN RESISTANCE: DETECTED — AB
NEISSERIA MENINGITIDIS: NOT DETECTED
Proteus species: NOT DETECTED
Pseudomonas aeruginosa: NOT DETECTED
STAPHYLOCOCCUS AUREUS BCID: NOT DETECTED
STREPTOCOCCUS PNEUMONIAE: NOT DETECTED
STREPTOCOCCUS PYOGENES: NOT DETECTED
STREPTOCOCCUS SPECIES: NOT DETECTED
Serratia marcescens: NOT DETECTED
Staphylococcus species: DETECTED — AB
Streptococcus agalactiae: NOT DETECTED
VANCOMYCIN RESISTANCE: NOT DETECTED

## 2016-01-06 LAB — TROPONIN I: TROPONIN I: 0.11 ng/mL — AB (ref <0.031)

## 2016-01-06 LAB — CARBOXYHEMOGLOBIN
Carboxyhemoglobin: 1.9 % — ABNORMAL HIGH (ref 0.5–1.5)
Methemoglobin: 1.3 % (ref 0.0–1.5)
O2 Saturation: 73.5 %
Total hemoglobin: 3.9 g/dL — CL (ref 13.5–18.0)

## 2016-01-06 LAB — GLUCOSE, CAPILLARY
GLUCOSE-CAPILLARY: 149 mg/dL — AB (ref 65–99)
GLUCOSE-CAPILLARY: 156 mg/dL — AB (ref 65–99)
Glucose-Capillary: 105 mg/dL — ABNORMAL HIGH (ref 65–99)
Glucose-Capillary: 138 mg/dL — ABNORMAL HIGH (ref 65–99)
Glucose-Capillary: 140 mg/dL — ABNORMAL HIGH (ref 65–99)
Glucose-Capillary: 301 mg/dL — ABNORMAL HIGH (ref 65–99)
Glucose-Capillary: 375 mg/dL — ABNORMAL HIGH (ref 65–99)

## 2016-01-06 LAB — PROTIME-INR
INR: 1.7 — AB (ref 0.00–1.49)
PROTHROMBIN TIME: 20 s — AB (ref 11.6–15.2)

## 2016-01-06 LAB — CBC
HEMATOCRIT: 27.5 % — AB (ref 39.0–52.0)
HEMOGLOBIN: 8.2 g/dL — AB (ref 13.0–17.0)
MCH: 24.6 pg — ABNORMAL LOW (ref 26.0–34.0)
MCHC: 29.8 g/dL — ABNORMAL LOW (ref 30.0–36.0)
MCV: 82.3 fL (ref 78.0–100.0)
PLATELETS: 241 10*3/uL (ref 150–400)
RBC: 3.34 MIL/uL — ABNORMAL LOW (ref 4.22–5.81)
RDW: 14.5 % (ref 11.5–15.5)
WBC: 10.8 10*3/uL — ABNORMAL HIGH (ref 4.0–10.5)

## 2016-01-06 LAB — URINE CULTURE: Culture: NO GROWTH

## 2016-01-06 LAB — HEPARIN LEVEL (UNFRACTIONATED): Heparin Unfractionated: 0.23 IU/mL — ABNORMAL LOW (ref 0.30–0.70)

## 2016-01-06 LAB — MAGNESIUM: MAGNESIUM: 1.9 mg/dL (ref 1.7–2.4)

## 2016-01-06 LAB — PHOSPHORUS: Phosphorus: 4.2 mg/dL (ref 2.5–4.6)

## 2016-01-06 MED ORDER — FUROSEMIDE 40 MG PO TABS
40.0000 mg | ORAL_TABLET | Freq: Every day | ORAL | Status: DC
  Administered 2016-01-07 – 2016-01-09 (×3): 40 mg via ORAL
  Filled 2016-01-06 (×3): qty 1

## 2016-01-06 MED ORDER — SPIRONOLACTONE 25 MG PO TABS
12.5000 mg | ORAL_TABLET | Freq: Every day | ORAL | Status: DC
  Administered 2016-01-06 – 2016-01-09 (×4): 12.5 mg via ORAL
  Filled 2016-01-06 (×4): qty 1

## 2016-01-06 MED ORDER — PROPOFOL 1000 MG/100ML IV EMUL: 5.0000 ug/kg/min | INTRAVENOUS | Status: DC

## 2016-01-06 MED ORDER — DIGOXIN 125 MCG PO TABS
0.1250 mg | ORAL_TABLET | Freq: Every day | ORAL | Status: DC
  Administered 2016-01-06 – 2016-01-09 (×4): 0.125 mg via ORAL
  Filled 2016-01-06 (×4): qty 1

## 2016-01-06 MED ORDER — HEPARIN (PORCINE) IN NACL 100-0.45 UNIT/ML-% IJ SOLN
1000.0000 [IU]/h | INTRAMUSCULAR | Status: DC
  Administered 2016-01-06: 750 [IU]/h via INTRAVENOUS
  Filled 2016-01-06: qty 250

## 2016-01-06 MED ORDER — FAMOTIDINE IN NACL 20-0.9 MG/50ML-% IV SOLN
20.0000 mg | INTRAVENOUS | Status: DC
Start: 2016-01-06 — End: 2016-01-07
  Administered 2016-01-07: 20 mg via INTRAVENOUS
  Filled 2016-01-06: qty 50

## 2016-01-06 NOTE — Progress Notes (Signed)
Heart Failure Navigator Consult Note  Presentation: Eddie [name] is a patient with severe HF s/p anterior MI several months ago c/b CVA. Has had several recent admits for respiratory failure. Again admitted with respiratory failure and shock. Suspect mostly cardiogenic in nature. Now awake on vent. Responding to commands. Initial co-ox marginal 58%. Diuresing with IV lasix. BP marginal. Will continue IV lasix. Repeat co-ox at 8pm. Low threshold to add inotropic therapy. No evidence of ACS. Resume plavix for recent stent.  Will likely need RHC. Will also need f/u in HF Clinic to try and break cycle of readmits for him.    Past Medical History  Diagnosis Date  . PUD (peptic ulcer disease)   . CAD (coronary artery disease)     a. STEMI 09/2015 w/ DES to Prox LAD  . CVA (cerebral infarction)     a. 09/2015: acute small right frontal lobe infarct    Social History   Social History  . Marital Status: Married    Spouse Name: N/A  . Number of Children: N/A  . Years of Education: N/A   Social History Main Topics  . Smoking status: Current Every Day Smoker -- 0.25 packs/day  . Smokeless tobacco: None  . Alcohol Use: No  . Drug Use: None  . Sexual Activity: Not Asked   Other Topics Concern  . None   Social History Narrative    ECHOStudy Conclusions  - Left ventricle: The cavity size was mildly dilated. Wall  thickness was increased in a pattern of moderate LVH. Systolic  function was normal. The estimated ejection fraction was in the  range of 15% to 20%. Severe diffuse hypokinesis. Regional wall  motion abnormalities cannot be excluded. Doppler parameters are  consistent with abnormal left ventricular relaxation (grade 1  diastolic dysfunction). - Aortic valve: Trileaflet; normal thickness, mildly calcified  leaflets. - Aortic root: The aortic root was mildly dilated. - Mitral valve: There was mild regurgitation. - Pericardium, extracardiac: A small pericardial  effusion was  identified.  ------------------------------------------------------------------- Labs, prior tests, procedures, and surgery: Catheterization (current admission). The study demonstrated coronary artery disease.  Transthoracic echocardiography. M-mode, complete 2D, spectral Doppler, and color Doppler. Birthdate: Patient birthdate: Sep 26, 1950. Age: Patient is 65 yr old. Sex: Gender: male. BMI: 24.6 kg/m^2. Blood pressure: 147/88 Patient status: Inpatient. Study date: Study date: 12/12/2015. Study time: 12:06 PM. Location: ICU/CCU:  BNP    Component Value Date/Time   BNP 1148.9* 01/05/2016 1310    ProBNP No results found for: PROBNP   Education Assessment and Provision:  Detailed education and instructions provided on heart failure disease management including the following:  Signs and symptoms of Heart Failure When to call the physician Importance of daily weights Low sodium diet Fluid restriction Medication management Anticipated future follow-up appointments  Patient education given on each of the above topics.  Patient acknowledges understanding and acceptance of all instructions.  I spoke with [name] regarding his HF.  He tells me that no one prior to this admission has told him that he has a weak heart.  He admits that he has not filled all medications because he cannot afford them.  He does not have a working scale and I will provide one for home use.  I reviewed the importance of daily weights and how they relate to the signs and symptoms of HF.  I reviewed  a low sodium diet and high sodium foods to avoid.  He admits that he enjoys high sodium foods and had  not made any modifications to his diet prior to his admission.  He will need medication assistance at discharge.  He will follow in the AHF Clinic after discharge.    Education Materials:  "Living Better With Heart Failure" Booklet, Daily Weight Tracker Tool    High Risk  Criteria for Readmission and/or Poor Patient Outcomes:  (Recommend Follow-up with Advanced Heart Failure Clinic)--patient will follow-up in the AHF Clinic   EF <30%- Yes 15-20%  2 or more admissions in 6 months- yes- 5/6 mo  Difficult social situation--No lives with wife in [place]  Demonstrates medication noncompliance- Yes -he says he cannot afford all prescriptions.    Barriers of Care:  Knowledge, compliance and financial constraints.    Discharge Planning:   Plans to return to home with his wife in [place].  He will benefit from [facility] for ongoing HF education,  Symptom recognition and compliance reinforcement.  I will refer him to the Outpatient LCSW in the AHF Clinic for financial assistance.

## 2016-01-06 NOTE — Progress Notes (Signed)
  PHARMACY - PHYSICIAN COMMUNICATION CRITICAL VALUE ALERT - BLOOD CULTURE IDENTIFICATION (BCID)  Results for orders placed or performed during the hospital encounter of 01/05/16  Blood Culture ID Panel (Reflexed) (Collected: 01/05/2016 10:19 AM)  Result Value Ref Range   Enterococcus species NOT DETECTED NOT DETECTED   Vancomycin resistance NOT DETECTED NOT DETECTED   Listeria monocytogenes NOT DETECTED NOT DETECTED   Staphylococcus species DETECTED (A) NOT DETECTED   Staphylococcus aureus NOT DETECTED NOT DETECTED   Methicillin resistance DETECTED (A) NOT DETECTED   Streptococcus species NOT DETECTED NOT DETECTED   Streptococcus agalactiae NOT DETECTED NOT DETECTED   Streptococcus pneumoniae NOT DETECTED NOT DETECTED   Streptococcus pyogenes NOT DETECTED NOT DETECTED   Acinetobacter baumannii NOT DETECTED NOT DETECTED   Enterobacteriaceae species NOT DETECTED NOT DETECTED   Enterobacter cloacae complex NOT DETECTED NOT DETECTED   Escherichia coli NOT DETECTED NOT DETECTED   Klebsiella oxytoca NOT DETECTED NOT DETECTED   Klebsiella pneumoniae NOT DETECTED NOT DETECTED   Proteus species NOT DETECTED NOT DETECTED   Serratia marcescens NOT DETECTED NOT DETECTED   Carbapenem resistance NOT DETECTED NOT DETECTED   Haemophilus influenzae NOT DETECTED NOT DETECTED   Neisseria meningitidis NOT DETECTED NOT DETECTED   Pseudomonas aeruginosa NOT DETECTED NOT DETECTED   Candida albicans NOT DETECTED NOT DETECTED   Candida glabrata NOT DETECTED NOT DETECTED   Candida krusei NOT DETECTED NOT DETECTED   Candida parapsilosis NOT DETECTED NOT DETECTED   Candida tropicalis NOT DETECTED NOT DETECTED    Name of physician (or Provider) Contacted: William Minor  Changes to prescribed antibiotics required: Pt already on Vanc + zosyn. Text paged NP with no answer.  Aliya Sol L. Roseanne Reno, PharmD PGY2 Infectious Diseases Pharmacy Resident Pager: [phone] 01/06/2016 4:18 PM

## 2016-01-06 NOTE — Progress Notes (Signed)
ANTICOAGULATION CONSULT NOTE - f/u Consult  Pharmacy Consult for heparin Indication: stroke  No Known Allergies  Patient Measurements: Height: 5' 6" (167.6 cm) Weight: 156 lb 4.9 oz (70.9 kg) IBW/kg (Calculated) : 63.8 Heparin Dosing Weight: 60.8 kg  Vital Signs: Temp: 99.1 F (37.3 C) (06/15 1505) Temp Source: Oral (06/15 1505) BP: 111/64 mmHg (06/15 1800) Pulse Rate: 106 (06/15 1800)  Labs:  Recent Labs  01/05/16 0935 01/05/16 1024 01/05/16 1310 01/05/16 1350 01/05/16 1822 01/05/16 2230 01/05/16 2244 01/06/16 0500 01/06/16 1839  HGB  --  8.9*  --   --   --   --   --  8.2*  --   HCT  --  31.0*  --   --   --   --   --  27.5*  --   PLT  --  246  --   --   --   --   --  241  --   LABPROT  --   --   --  19.4*  --   --  20.8* 20.0*  --   INR  --   --   --  1.64*  --   --  1.79* 1.70*  --   HEPARINUNFRC  --   --   --   --   --   --   --   --  0.23*  CREATININE 1.40*  --   --   --   --   --   --  1.59*  --   TROPONINI  --   --  0.08*  --  0.08* 0.11*  --   --   --     Estimated Creatinine Clearance: 42.4 mL/min (by C-G formula based on Cr of 1.59).   Medical History: Past Medical History  Diagnosis Date  . PUD (peptic ulcer disease)   . CAD (coronary artery disease)     a. STEMI 09/2015 w/ DES to Prox LAD  . CVA (cerebral infarction)     a. 09/2015: acute small right frontal lobe infarct    Medications:  Scheduled:  . antiseptic oral rinse  7 mL Mouth Rinse QID  . aspirin  81 mg Oral Daily  . atorvastatin  80 mg Oral q1800  . chlorhexidine gluconate (SAGE KIT)  15 mL Mouth Rinse BID  . clopidogrel  75 mg Oral Daily  . digoxin  0.125 mg Oral Daily  . famotidine (PEPCID) IV  20 mg Intravenous Q24H  . [START ON 01/07/2016] furosemide  40 mg Oral Daily  . insulin aspart  0-15 Units Subcutaneous Q4H  . ipratropium-albuterol  3 mL Nebulization Q6H  . nitroGLYCERIN  5-200 mcg/min Intravenous Once  . spironolactone  12.5 mg Oral Daily    Assessment: 65 yo man  admitted 01/05/2016 with respiratory failure on warfarin PTA for hx CVA. Pharmacy consulted to dose heparin.  PMH CAD s/p DES, hx CVA, PUD, HFrEF (15-20%), HTN  INR 1.64 on admission, 1.7 today. Hgb 8.2, plt wnl. No noted bleeding.  Initial HL 0.23 slightly low.  Goal of Therapy:  Heparin level 0.3-0.5 units/ml Monitor platelets by anticoagulation protocol: Yes   Plan:  Increase IV heparin to 850 units/hr Daily HL and CBC   Flora Parks S. Alford Highland, PharmD, [place] Clinical Staff Pharmacist Pager [phone]   01/06/2016,7:38 PM

## 2016-01-06 NOTE — Progress Notes (Signed)
PULMONARY / CRITICAL CARE MEDICINE   Name: Eddie [name] MRN: [id number] DOB: Apr 08, 1951    ADMISSION DATE:  01/05/2016  REFERRING MD:  Francene Castle Fayrene Fearing)  CHIEF COMPLAINT:  Respiratory failure   HISTORY OF PRESENT ILLNESS:   65yo male with hx CAD, CVA, multiple recent admissions for acute respiratory failure (5/20-5/24 and 5/29-6/2) in the setting of decompensated CHF and EColi bacteremia.  He returned 6/14 after calling EMS with c/o SOB.  On EMS arrival he was awake and able to ambulate to their stretcher then became unresponsive and intubated with [facility] airway by EMS.  Reintubated with ETT on arrival to ER.  In ER had significant hypertension, ongoing AMS, mild AKI.    SUBJECTIVE:  Awake and alert Tolerating PSV  VITAL SIGNS: BP 112/62 mmHg  Pulse 94  Temp(Src) 99.1 F (37.3 C) (Oral)  Resp 17  Ht  (1.676 m)  Wt 156 lb 4.9 oz (70.9 kg)  BMI 25.24 kg/m2  SpO2 100%  HEMODYNAMICS: CVP:  [3 mmHg-9 mmHg] 8 mmHg  VENTILATOR SETTINGS: Vent Mode:  [-] PSV;CPAP FiO2 (%):  [40 %-100 %] 40 % Set Rate:  [12 bmp-24 bmp] 24 bmp Vt Set:  [500 mL] 500 mL PEEP:  [5 cmH20-8 cmH20] 5 cmH20 Pressure Support:  [5 cmH20] 5 cmH20 Plateau Pressure:  [21 cmH20-30 cmH20] 23 cmH20  INTAKE / OUTPUT: I/O last 3 completed shifts: In: 912.8 [I.V.:162.8; Other:200; IV Piggyback:550] Out: 2500 [Urine:2500]  PHYSICAL EXAMINATION: General:  Chronically ill appearing male, Awake on vent  Neuro:  Awake, alert, interacting, following commands HEENT:  Mm moist, JVD +, pupils 4mm  Cardiovascular:  s1s2 rrr Lungs:  resps even non labored on vent, cta on t tube Abdomen:  Round, soft, non tender Musculoskeletal:  Warm and dry, scant BLE edema   LABS:  BMET  Recent Labs Lab 01/05/16 0935 01/06/16 0500  NA 138 139  K 4.9 3.6  CL 109 102  CO2 20* 25  BUN 11 22*  CREATININE 1.40* 1.59*  GLUCOSE 287* 151*    Electrolytes  Recent Labs Lab 01/05/16 0935 01/05/16 1310 01/06/16 0500   CALCIUM 8.2*  --  8.4*  MG  --  2.4 1.9  PHOS  --  6.7* 4.2    CBC  Recent Labs Lab 01/05/16 1024 01/06/16 0500  WBC 7.5 10.8*  HGB 8.9* 8.2*  HCT 31.0* 27.5*  PLT 246 241    Coag's  Recent Labs Lab 01/05/16 1350 01/05/16 2244 01/06/16 0500  INR 1.64* 1.79* 1.70*    Sepsis Markers  Recent Labs Lab 01/05/16 0948 01/05/16 1310  LATICACIDVEN 5.55* 0.8  PROCALCITON  --  0.20    ABG  Recent Labs Lab 01/05/16 1251 01/05/16 1614  PHART 7.172* 7.402  PCO2ART 74.2* 42.7  PO2ART 197* 165.0*    Liver Enzymes  Recent Labs Lab 01/05/16 0935  AST 43*  ALT 23  ALKPHOS 105  BILITOT 1.0  ALBUMIN 3.0*    Cardiac Enzymes  Recent Labs Lab 01/05/16 1310 01/05/16 1822 01/05/16 2230  TROPONINI 0.08* 0.08* 0.11*    Glucose  Recent Labs Lab 01/05/16 1343 01/05/16 1632 01/05/16 1934 01/06/16 0018 01/06/16 0354 01/06/16 0719  GLUCAP 282* 221* 165* 105* 138* 149*    Imaging Ct Head Wo Contrast  01/05/2016  CLINICAL DATA:  Altered level of consciousness. History of prior CVA EXAM: CT HEAD WITHOUT CONTRAST TECHNIQUE: Contiguous axial images were obtained from the base of the skull through the vertex without intravenous contrast. COMPARISON:  Dec 11, 2015 FINDINGS: The ventricles are normal in size and configuration. There is no intracranial mass, hemorrhage, extra-axial fluid collection, or midline shift. There is a prior small lacunar infarct in the right corona radiata, stable. A tiny lacunar infarct in the anterior left corona radiata is stable as well. There is no new gray-white compartment lesion. No acute infarct evident. The bony calvarium appears intact. The mastoid air cells are clear. Visualized orbits appear symmetric bilaterally. A small calcification in the anterior aspect of the right globe is stable. IMPRESSION: There is a small lacunar infarct in each corona radiata, stable. No new gray-white compartment lesion. No acute infarct. No hemorrhage  or mass. Electronically Signed   By: Bretta Bang III M.D.   On: 01/05/2016 11:10   Dg Chest Port 1 View  01/06/2016  CLINICAL DATA:  Pulmonary edema . EXAM: PORTABLE CHEST 1 VIEW COMPARISON:  01/05/2016. FINDINGS: Endotracheal tube, NG tube, right IJ line in stable position. Heart size normal. Interim near complete clearing of bilateral pulmonary infiltrates. No pleural effusion or pneumothorax. IMPRESSION: 1.  Lines and tubes in stable position. 2. Interim near complete clearing of bilateral pulmonary infiltrates and/or edema. Electronically Signed   By: Maisie Fus  Register   On: 01/06/2016 07:34   Dg Chest Portable 1 View  01/05/2016  CLINICAL DATA:  Central line placement EXAM: PORTABLE CHEST 1 VIEW COMPARISON:  Earlier today FINDINGS: Endotracheal and orogastric tubes remains in unchanged position where seen. New right IJ central line with tip at the SVC level. No pneumothorax or new mediastinal widening. Symmetric perihilar interstitial and airspace opacities consistent with edema. Stable cardiopericardial size. Stable aortic contours. IMPRESSION: 1. New central line without complicating feature. 2. Unchanged edema. Electronically Signed   By: Marnee Spring M.D.   On: 01/05/2016 12:34   Dg Chest Portable 1 View  01/05/2016  CLINICAL DATA:  Hypoxia EXAM: PORTABLE CHEST 1 VIEW COMPARISON:  Dec 22, 2015 FINDINGS: Endotracheal tube tip is 5.3 cm above the carina. Nasogastric tube tip and side port are in this distal stomach. No pneumothorax. There is widespread alveolar edema, present diffusely but most pronounced in a perihilar distribution. There is also focal airspace consolidation, asymmetric, in the right base compared to the left base. There is mild cardiomegaly with pulmonary venous hypertension. No adenopathy evident. IMPRESSION: Tube positions as described without pneumothorax. Findings indicative of congestive heart failure. Superimposed pneumonia, particularly in the right base, cannot be  excluded. Both congestive heart failure and pneumonia may well be present at this time. Electronically Signed   By: Bretta Bang III M.D.   On: 01/05/2016 10:01     STUDIES:  CT head 6/14>>> neg  CULTURES: Sputum 6/14>>>  BC x 2 6/14>>>   ANTIBIOTICS: Vanc 6/14>>> Zosyn 6/14>>>  SIGNIFICANT EVENTS:   LINES/TUBES: ETT 6/14>>>6/15  DISCUSSION: 65yo male with recurrent VDRF in setting decompensated CHF and pulmonary edema. 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  ??compliance at home.  Pulmonary edema  HTN crisis  CAD s/p DES  P:  BP control  Labetalol 20mg  IV x1   Continue diuresis  Trend troponin, BNP Heart failure team following UDS  Continue home asa, lipitor,  coreg Heparin gtt per pharmacy for now for possible ACS. Unclear what his long term indication for anticoagulation is. We will need to discuss w cardiology. May be able to stop warfarin.   RENAL AKI on CKD2.  Baseline Scr ~1.1.  P:   F/u chem  Monitor with diuresis, may need to decrease depending on renal status  GASTROINTESTINAL No active issue  P:   NPO - assess for diet PPI   HEMATOLOGIC Anticoagulation needs  Anemia of chronic disease  P:  F/u cbc  Heparin gtt for now as above, but unclear that there is a true indication here for warfarin. Will discuss w cards  INFECTIOUS ?HCAP -- more likely pulmonary edema  Recent EColi UTI with bacteremia  P:   Vanc, zosyn as above  Trend pct .20 Pan culture   ENDOCRINE DM   P:   SSI   NEUROLOGIC AMS - r/t hypoxia, hypercarbia, HTN  Previous CVA march 2017, on coumadin but ? True indication P:   RASS goal: 0 Propofol, PRN  fentanyl for sedation ,dc 6/15 CT head neg     FAMILY  - Updates:  Wife updated at bedside 6/14 in ER   - Inter-disciplinary family meet or Palliative Care meeting due by:  6/21    [name] ACNP [facility] PCCM Pager [phone] till 3 pm If no answer page [phone] 01/06/2016, 9:09 AM   Attending Note:  I have examined patient, reviewed labs, studies and notes. I have discussed the case with S Minor, and I agree with the data and plans as amended above. 65 yo man with severe cardiomyopathy, admitted with acute respiratory failure, shock and lactic acidosis requiring MV. He quickly cleared his B infiltrates. On my eval he is awake, alert and tolerating PSV. I believe he can be extubated and can stop his abx. Will need to discuss w cardiology whether he has a true indication for coumadin. He has a hx of CVA but I do not see anything in the record about A fib or an embolic process. Independent critical care time is 36 minutes.   Levy Pupa, MD, PhD 01/06/2016, 2:49 PM [facility] [phone] or if no answer [phone]

## 2016-01-06 NOTE — Care Management Note (Signed)
Case Management Note  Patient Details  Name: Eddie [name] MRN: [id number] Date of Birth: 05/06/1951  Subjective/Objective:        Pt admitted for acute resp failure            Action/Plan:  Pt from home with wife.  Pt currently going to outpt PT and Speech therapy with [facility] for the past month.  Pt has had multiple admits recently.  HF consulted.  Pt states his wife does not cook with salt, doesn't currently have working scale but stated he would pick one up day of discharge.  Pt states he has been compliant with both home diuretics - CM went over medications from home and informed which ones were on [facility] generic list.  Pt has active insurance - would definitely benefit from [facility] for disease management but due to hardship can not pay copay (multiple doctor appt and medications all requiring copays)  - CM reached out to [facility] agency to determine if pt would have copay.  CM requested pt stay in close contact with PCP regarding HF symptoms and daily weight changes.  CM also requested that pt form a list of current medication and provide them to each doctor that he sees.  CM will consult with HF nurse.   Expected Discharge Date:  01/13/16               Expected Discharge Plan:  Home w Home Health Services  In-House Referral:     Discharge planning Services  CM Consult  Post Acute Care Choice:    Choice offered to:     DME Arranged:    DME Agency:     HH Arranged:    HH Agency:     Status of Service:  In process, will continue to follow  Medicare Important Message Given:    Date Medicare IM Given:    Medicare IM give by:    Date Additional Medicare IM Given:    Additional Medicare Important Message give by:     If discussed at Long Length of Stay Meetings, dates discussed:    Additional Comments:  Cherylann Parr, RN 01/06/2016, 10:36 AM

## 2016-01-06 NOTE — Progress Notes (Signed)
RT went in patient room to obtain ABG and patient started to thrash around and shake head.  Patient being very aggressive with RT and CNA.  RT explained to patient the importance of obtaining the ABG and patient still refused to cooperate.  RN aware.

## 2016-01-06 NOTE — Progress Notes (Signed)
Dr. Kendrick Fries made aware of increasing need for sedation, blood pressure trending down.  Received new order to titrate sedation for MAP 60's.

## 2016-01-06 NOTE — Progress Notes (Signed)
ANTICOAGULATION CONSULT NOTE - Initial Consult  Pharmacy Consult for heparin Indication: stroke  No Known Allergies  Patient Measurements: Height: _0  (167.6 cm) Weight: 156 lb 4.9 oz (70.9 kg) IBW/kg (Calculated) : 63.8 Heparin Dosing Weight: 60.8 kg  Vital Signs: Temp: 99.1 F (37.3 C) (06/15 0724) Temp Source: Oral (06/15 0724) BP: 112/62 mmHg (06/15 0855) Pulse Rate: 94 (06/15 0855)  Labs:  Recent Labs  01/05/16 0935 01/05/16 1024 01/05/16 1310 01/05/16 1350 01/05/16 1822 01/05/16 2230 01/05/16 2244 01/06/16 0500  HGB  --  8.9*  --   --   --   --   --  8.2*  HCT  --  31.0*  --   --   --   --   --  27.5*  PLT  --  246  --   --   --   --   --  241  LABPROT  --   --   --  19.4*  --   --  20.8* 20.0*  INR  --   --   --  1.64*  --   --  1.79* 1.70*  CREATININE 1.40*  --   --   --   --   --   --  1.59*  TROPONINI  --   --  0.08*  --  0.08* 0.11*  --   --     Estimated Creatinine Clearance: 42.4 mL/min (by C-G formula based on Cr of 1.59).   Medical History: Past Medical History  Diagnosis Date  . PUD (peptic ulcer disease)   . CAD (coronary artery disease)     a. STEMI 09/2015 w/ DES to Prox LAD  . CVA (cerebral infarction)     a. 09/2015: acute small right frontal lobe infarct    Medications:  Scheduled:  . antiseptic oral rinse  7 mL Mouth Rinse QID  . aspirin  81 mg Oral Daily  . atorvastatin  80 mg Oral q1800  . chlorhexidine gluconate (SAGE KIT)  15 mL Mouth Rinse BID  . clopidogrel  75 mg Oral Daily  . enoxaparin (LOVENOX) injection  40 mg Subcutaneous Q24H  . famotidine (PEPCID) IV  20 mg Intravenous Q12H  . furosemide  80 mg Intravenous BID  . insulin aspart  0-15 Units Subcutaneous Q4H  . ipratropium-albuterol  3 mL Nebulization Q6H  . methylPREDNISolone (SOLU-MEDROL) injection  40 mg Intravenous Q12H  . nitroGLYCERIN  5-200 mcg/min Intravenous Once  . piperacillin-tazobactam (ZOSYN)  IV  3.375 g Intravenous Q8H  . vancomycin  500 mg  Intravenous Q12H    Assessment: Eddie yo man admitted 01/05/2016 with respiratory failure on warfarin PTA for hx CVA. Pharmacy consulted to dose heparin.  PMH CAD s/p DES, hx CVA, PUD, HFrEF (15-20%), HTN  INR 1.64 on admission, 1.7 today. Hgb 8.2, plt wnl. No noted bleeding.   Goal of Therapy:  Heparin level 0.3-0.5 units/ml Monitor platelets by anticoagulation protocol: Yes   Plan:  Start heparin infusion at 750 units/hr Check anti-Xa level in 8 hours and daily while on heparin Continue to monitor H&H and platelets   Heloise Ochoa, Pharm.D., BCPS PGY2 Cardiology Pharmacy Resident Pager: [phone] 01/06/2016,9:19 AM

## 2016-01-06 NOTE — Procedures (Signed)
Extubation Procedure Note  Patient Details:   Name: Eddie [name] DOB: 19-Sep-1950 MRN: [id number]   Airway Documentation:     Evaluation  O2 sats: 100%  Complications: No apparent complications Patient did tolerate procedure well. Bilateral Breath Sounds: Diminished, Clear   Yes able to speak, no stridor at this time, pt on 4L [place], no distress noted at this time.   Cherylin Mylar 01/06/2016, 9:22 AM

## 2016-01-06 NOTE — Progress Notes (Signed)
Hb resulted at 8.2 on current am CBC.

## 2016-01-06 NOTE — Progress Notes (Signed)
Advanced Heart Failure Rounding Note   Subjective:     Extubated today. Feels better. Bcx 2/2 MRSA. Zosyn off. Remains on Vanc  CXR now clear.   Co-ox improved now  73% CVP 4  Objective:   Weight Range:  Vital Signs:   Temp:  [97.5 F (36.4 C)-99.7 F (37.6 C)] 99.1 F (37.3 C) (06/15 1505) Pulse Rate:  [25-104] 104 (06/15 1600) Resp:  [15-30] 24 (06/15 1600) BP: (69-138)/(48-81) 129/67 mmHg (06/15 1600) SpO2:  [97 %-100 %] 99 % (06/15 1600) FiO2 (%):  [40 %] 40 % (06/15 0855) Weight:  [70.8 kg (156 lb 1.4 oz)-70.9 kg (156 lb 4.9 oz)] 70.9 kg (156 lb 4.9 oz) (06/15 0500)    Weight change: Filed Weights   01/05/16 1300 01/05/16 2000 01/06/16 0500  Weight: 60.8 kg (134 lb 0.6 oz) 70.8 kg (156 lb 1.4 oz) 70.9 kg (156 lb 4.9 oz)    Intake/Output:   Intake/Output Summary (Last 24 hours) at 01/06/16 1720 Last data filed at 01/06/16 1300  Gross per 24 hour  Intake 664.18 ml  Output   3115 ml  Net -2450.82 ml     Physical Exam: General:  Sitting up on side of bed in NAD HEENT: normal Neck: supple. JVP 4 . Carotids 2+ bilat; no bruits. No lymphadenopathy or thryomegaly appreciated. Cor: PMI laterally displaced. Regular rate & rhythm. No rubs, gallops or murmurs. Lungs: clear Abdomen: soft, nontender, nondistended. No hepatosplenomegaly. No bruits or masses. Good bowel sounds. Extremities: no cyanosis, clubbing, rash, tr edema Neuro: alert & orientedx3, cranial nerves grossly intact. moves all 4 extremities w/o difficulty. Affect pleasant  Telemetry: SR/S tach   Labs: Basic Metabolic Panel:  Recent Labs Lab 01/05/16 0935 01/05/16 1310 01/06/16 0500  NA 138  --  139  K 4.9  --  3.6  CL 109  --  102  CO2 20*  --  25  GLUCOSE 287*  --  151*  BUN 11  --  22*  CREATININE 1.40*  --  1.59*  CALCIUM 8.2*  --  8.4*  MG  --  2.4 1.9  PHOS  --  6.7* 4.2    Liver Function Tests:  Recent Labs Lab 01/05/16 0935  AST 43*  ALT 23  ALKPHOS 105  BILITOT  1.0  PROT 7.4  ALBUMIN 3.0*   No results for input(s): LIPASE, AMYLASE in the last 168 hours. No results for input(s): AMMONIA in the last 168 hours.  CBC:  Recent Labs Lab 01/05/16 1024 01/06/16 0500  WBC 7.5 10.8*  HGB 8.9* 8.2*  HCT 31.0* 27.5*  MCV 88.1 82.3  PLT 246 241    Cardiac Enzymes:  Recent Labs Lab 01/05/16 1310 01/05/16 1822 01/05/16 2230  TROPONINI 0.08* 0.08* 0.11*    BNP: BNP (last 3 results)  Recent Labs  12/11/15 0720 12/20/15 1928 01/05/16 1310  BNP 492.7* 1475.7* 1148.9*    ProBNP (last 3 results) No results for input(s): PROBNP in the last 8760 hours.    Other results:  Imaging: Ct Head Wo Contrast  01/05/2016  CLINICAL DATA:  Altered level of consciousness. History of prior CVA EXAM: CT HEAD WITHOUT CONTRAST TECHNIQUE: Contiguous axial images were obtained from the base of the skull through the vertex without intravenous contrast. COMPARISON:  Dec 11, 2015 FINDINGS: The ventricles are normal in size and configuration. There is no intracranial mass, hemorrhage, extra-axial fluid collection, or midline shift. There is a prior small lacunar infarct in the right corona  radiata, stable. A tiny lacunar infarct in the anterior left corona radiata is stable as well. There is no new gray-white compartment lesion. No acute infarct evident. The bony calvarium appears intact. The mastoid air cells are clear. Visualized orbits appear symmetric bilaterally. A small calcification in the anterior aspect of the right globe is stable. IMPRESSION: There is a small lacunar infarct in each corona radiata, stable. No new gray-white compartment lesion. No acute infarct. No hemorrhage or mass. Electronically Signed   By: Lowella Grip III M.D.   On: 01/05/2016 11:10   Dg Chest Port 1 View  01/06/2016  CLINICAL DATA:  Pulmonary edema . EXAM: PORTABLE CHEST 1 VIEW COMPARISON:  01/05/2016. FINDINGS: Endotracheal tube, NG tube, right IJ line in stable position.  Heart size normal. Interim near complete clearing of bilateral pulmonary infiltrates. No pleural effusion or pneumothorax. IMPRESSION: 1.  Lines and tubes in stable position. 2. Interim near complete clearing of bilateral pulmonary infiltrates and/or edema. Electronically Signed   By: Marcello Moores  Register   On: 01/06/2016 07:34   Dg Chest Portable 1 View  01/05/2016  CLINICAL DATA:  Central line placement EXAM: PORTABLE CHEST 1 VIEW COMPARISON:  Earlier today FINDINGS: Endotracheal and orogastric tubes remains in unchanged position where seen. New right IJ central line with tip at the SVC level. No pneumothorax or new mediastinal widening. Symmetric perihilar interstitial and airspace opacities consistent with edema. Stable cardiopericardial size. Stable aortic contours. IMPRESSION: 1. New central line without complicating feature. 2. Unchanged edema. Electronically Signed   By: Monte Fantasia M.D.   On: 01/05/2016 12:34   Dg Chest Portable 1 View  01/05/2016  CLINICAL DATA:  Hypoxia EXAM: PORTABLE CHEST 1 VIEW COMPARISON:  Dec 22, 2015 FINDINGS: Endotracheal tube tip is 5.3 cm above the carina. Nasogastric tube tip and side port are in this distal stomach. No pneumothorax. There is widespread alveolar edema, present diffusely but most pronounced in a perihilar distribution. There is also focal airspace consolidation, asymmetric, in the right base compared to the left base. There is mild cardiomegaly with pulmonary venous hypertension. No adenopathy evident. IMPRESSION: Tube positions as described without pneumothorax. Findings indicative of congestive heart failure. Superimposed pneumonia, particularly in the right base, cannot be excluded. Both congestive heart failure and pneumonia may well be present at this time. Electronically Signed   By: Lowella Grip III M.D.   On: 01/05/2016 10:01       Medications:     Scheduled Medications: . antiseptic oral rinse  7 mL Mouth Rinse QID  . aspirin  81 mg  Oral Daily  . atorvastatin  80 mg Oral q1800  . chlorhexidine gluconate (SAGE KIT)  15 mL Mouth Rinse BID  . clopidogrel  75 mg Oral Daily  . famotidine (PEPCID) IV  20 mg Intravenous Q24H  . furosemide  80 mg Intravenous BID  . insulin aspart  0-15 Units Subcutaneous Q4H  . ipratropium-albuterol  3 mL Nebulization Q6H  . nitroGLYCERIN  5-200 mcg/min Intravenous Once     Infusions: . heparin 750 Units/hr (01/06/16 1222)     PRN Medications:  hydrALAZINE, midazolam   Assessment:   1. Acute Respiratory Failure 2. 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 ? HCAP 4. CAD- 09/2015 DES to LAD  5. CVA 09/2015  6. MRSA bacteremia 7. Hypokalemia    Plan/Discussion:    Much improved. I agree with the CCM team that main issue likely recurrent HF but now with bcx 2/2 MRSA. Remains on vancomycin. Change  lasix to po. Restart spiro. Start digoxin. Will try to start ARB tomorrow. ABX per primary team.      Length of Stay: 1   Bensimhon, Daniel MD 01/06/2016, 5:20 PM  Advanced Heart Failure Team Pager [phone] (M-F; 7a - 4p)  Please contact [facility] for night-coverage after hours (4p -7a ) and weekends on [email or web address]

## 2016-01-06 NOTE — Progress Notes (Signed)
RN notified of panic hemoglobin on carboxyhemoglobin via face to face encounter.

## 2016-01-07 ENCOUNTER — Inpatient Hospital Stay (HOSPITAL_COMMUNITY): Payer: BLUE CROSS/BLUE SHIELD

## 2016-01-07 DIAGNOSIS — I5043 Acute on chronic combined systolic (congestive) and diastolic (congestive) heart failure: Secondary | ICD-10-CM

## 2016-01-07 DIAGNOSIS — I5021 Acute systolic (congestive) heart failure: Secondary | ICD-10-CM

## 2016-01-07 LAB — CBC
HEMATOCRIT: 26.6 % — AB (ref 39.0–52.0)
HEMOGLOBIN: 7.9 g/dL — AB (ref 13.0–17.0)
MCH: 24.3 pg — ABNORMAL LOW (ref 26.0–34.0)
MCHC: 29.7 g/dL — AB (ref 30.0–36.0)
MCV: 81.8 fL (ref 78.0–100.0)
Platelets: 251 10*3/uL (ref 150–400)
RBC: 3.25 MIL/uL — ABNORMAL LOW (ref 4.22–5.81)
RDW: 14.6 % (ref 11.5–15.5)
WBC: 15.4 10*3/uL — ABNORMAL HIGH (ref 4.0–10.5)

## 2016-01-07 LAB — BASIC METABOLIC PANEL
ANION GAP: 9 (ref 5–15)
BUN: 28 mg/dL — ABNORMAL HIGH (ref 6–20)
CHLORIDE: 103 mmol/L (ref 101–111)
CO2: 29 mmol/L (ref 22–32)
CREATININE: 1.5 mg/dL — AB (ref 0.61–1.24)
Calcium: 8.4 mg/dL — ABNORMAL LOW (ref 8.9–10.3)
GFR calc non Af Amer: 47 mL/min — ABNORMAL LOW (ref >60)
GFR, EST AFRICAN AMERICAN: 55 mL/min — AB (ref >60)
Glucose, Bld: 118 mg/dL — ABNORMAL HIGH (ref 65–99)
Potassium: 3.4 mmol/L — ABNORMAL LOW (ref 3.5–5.1)
Sodium: 141 mmol/L (ref 135–145)

## 2016-01-07 LAB — CARBOXYHEMOGLOBIN
Carboxyhemoglobin: 2.3 % — ABNORMAL HIGH (ref 0.5–1.5)
METHEMOGLOBIN: 0.8 % (ref 0.0–1.5)
O2 Saturation: 75.9 %
Total hemoglobin: 8.3 g/dL — ABNORMAL LOW (ref 13.5–18.0)

## 2016-01-07 LAB — GLUCOSE, CAPILLARY
GLUCOSE-CAPILLARY: 116 mg/dL — AB (ref 65–99)
GLUCOSE-CAPILLARY: 176 mg/dL — AB (ref 65–99)
GLUCOSE-CAPILLARY: 215 mg/dL — AB (ref 65–99)
Glucose-Capillary: 393 mg/dL — ABNORMAL HIGH (ref 65–99)

## 2016-01-07 LAB — PROTIME-INR
INR: 1.66 — ABNORMAL HIGH (ref 0.00–1.49)
PROTHROMBIN TIME: 19.6 s — AB (ref 11.6–15.2)

## 2016-01-07 LAB — MAGNESIUM: MAGNESIUM: 2 mg/dL (ref 1.7–2.4)

## 2016-01-07 LAB — HEPARIN LEVEL (UNFRACTIONATED)
HEPARIN UNFRACTIONATED: 0.1 [IU]/mL — AB (ref 0.30–0.70)
HEPARIN UNFRACTIONATED: 0.27 [IU]/mL — AB (ref 0.30–0.70)
Heparin Unfractionated: 0.2 IU/mL — ABNORMAL LOW (ref 0.30–0.70)

## 2016-01-07 LAB — PHOSPHORUS: Phosphorus: 3.7 mg/dL (ref 2.5–4.6)

## 2016-01-07 MED ORDER — SODIUM CHLORIDE 0.9% FLUSH: 10.0000 mL | Freq: Two times a day (BID) | INTRAVENOUS | Status: DC

## 2016-01-07 MED ORDER — PANTOPRAZOLE SODIUM 40 MG PO TBEC
40.0000 mg | DELAYED_RELEASE_TABLET | Freq: Every day | ORAL | Status: DC
  Administered 2016-01-07 – 2016-01-09 (×3): 40 mg via ORAL
  Filled 2016-01-07 (×3): qty 1

## 2016-01-07 MED ORDER — SACUBITRIL-VALSARTAN 24-26 MG PO TABS
1.0000 | ORAL_TABLET | Freq: Two times a day (BID) | ORAL | Status: DC
  Administered 2016-01-07: 1 via ORAL
  Filled 2016-01-07 (×2): qty 1

## 2016-01-07 MED ORDER — WARFARIN - PHARMACIST DOSING INPATIENT: Freq: Every day | Status: DC

## 2016-01-07 MED ORDER — SODIUM CHLORIDE 0.9% FLUSH
10.0000 mL | INTRAVENOUS | Status: DC | PRN
  Administered 2016-01-07: 10 mL
  Administered 2016-01-08: 30 mL
  Administered 2016-01-08 (×3): 10 mL
  Administered 2016-01-09: 30 mL
  Filled 2016-01-07 (×6): qty 40

## 2016-01-07 MED ORDER — HEPARIN (PORCINE) IN NACL 100-0.45 UNIT/ML-% IJ SOLN
1250.0000 [IU]/h | INTRAMUSCULAR | Status: DC
  Administered 2016-01-07: 1150 [IU]/h via INTRAVENOUS
  Filled 2016-01-07: qty 250

## 2016-01-07 MED ORDER — POTASSIUM CHLORIDE CRYS ER 20 MEQ PO TBCR
40.0000 meq | EXTENDED_RELEASE_TABLET | Freq: Once | ORAL | Status: AC
  Administered 2016-01-07: 40 meq via ORAL
  Filled 2016-01-07: qty 2

## 2016-01-07 MED ORDER — WARFARIN SODIUM 4 MG PO TABS
4.0000 mg | ORAL_TABLET | Freq: Once | ORAL | Status: AC
  Administered 2016-01-07: 4 mg via ORAL
  Filled 2016-01-07 (×2): qty 1

## 2016-01-07 NOTE — Progress Notes (Signed)
Advanced Heart Failure Rounding Note   Subjective:     Extubated 6/15. Feels better. Bcx 2/2 coag-neg staph  Co-ox improved 76% CVP 4  Objective:   Weight Range:  Vital Signs:   Temp:  [98 F (36.7 C)-98.5 F (36.9 C)] 98 F (36.7 C) (06/16 1152) Pulse Rate:  [78-106] 85 (06/16 1400) Resp:  [15-31] 19 (06/16 1400) BP: (104-146)/(55-89) 122/67 mmHg (06/16 1400) SpO2:  [95 %-100 %] 97 % (06/16 1400) Weight:  [150 lb 9.2 oz (68.3 kg)] 150 lb 9.2 oz (68.3 kg) (06/16 0500) Last BM Date: 01/02/16  Weight change: Filed Weights   01/05/16 2000 01/06/16 0500 01/07/16 0500  Weight: 156 lb 1.4 oz (70.8 kg) 156 lb 4.9 oz (70.9 kg) 150 lb 9.2 oz (68.3 kg)    Intake/Output:   Intake/Output Summary (Last 24 hours) at 01/07/16 1505 Last data filed at 01/07/16 1400  Gross per 24 hour  Intake  590.4 ml  Output   1775 ml  Net -1184.6 ml     Physical Exam: General:  Sitting up on side of bed in NAD HEENT: normal Neck: supple. JVP 4 . Carotids 2+ bilat; no bruits. No lymphadenopathy or thryomegaly appreciated. Cor: PMI laterally displaced. Regular rate & rhythm. No rubs, gallops or murmurs. Lungs: clear Abdomen: soft, nontender, nondistended. No hepatosplenomegaly. No bruits or masses. Good bowel sounds. Extremities: no cyanosis, clubbing, rash, tr edema Neuro: alert & orientedx3, cranial nerves grossly intact. moves all 4 extremities w/o difficulty. Affect pleasant  Telemetry: SR/S tach   Labs: Basic Metabolic Panel:  Recent Labs Lab 01/05/16 0935 01/05/16 1310 01/06/16 0500 01/07/16 0450  NA 138  --  139 141  K 4.9  --  3.6 3.4*  CL 109  --  102 103  CO2 20*  --  25 29  GLUCOSE 287*  --  151* 118*  BUN 11  --  22* 28*  CREATININE 1.40*  --  1.59* 1.50*  CALCIUM 8.2*  --  8.4* 8.4*  MG  --  2.4 1.9 2.0  PHOS  --  6.7* 4.2 3.7    Liver Function Tests:  Recent Labs Lab 01/05/16 0935  AST 43*  ALT 23  ALKPHOS 105  BILITOT 1.0  PROT 7.4  ALBUMIN 3.0*     No results for input(s): LIPASE, AMYLASE in the last 168 hours. No results for input(s): AMMONIA in the last 168 hours.  CBC:  Recent Labs Lab 01/05/16 1024 01/06/16 0500 01/07/16 0450  WBC 7.5 10.8* 15.4*  HGB 8.9* 8.2* 7.9*  HCT 31.0* 27.5* 26.6*  MCV 88.1 82.3 81.8  PLT 246 241 251    Cardiac Enzymes:  Recent Labs Lab 01/05/16 1310 01/05/16 1822 01/05/16 2230  TROPONINI 0.08* 0.08* 0.11*    BNP: BNP (last 3 results)  Recent Labs  12/11/15 0720 12/20/15 1928 01/05/16 1310  BNP 492.7* 1475.7* 1148.9*    ProBNP (last 3 results) No results for input(s): PROBNP in the last 8760 hours.    Other results:  Imaging: Dg Chest Port 1 View  01/07/2016  CLINICAL DATA:  Respiratory failure. EXAM: PORTABLE CHEST 1 VIEW COMPARISON:  01/06/2016. FINDINGS: Interim extubation removal of NG tube. Right IJ line stable position. Mediastinum and hilar structures normal. Cardiomegaly with normal pulmonary vascularity. No focal infiltrate. No pleural effusion or pneumothorax. IMPRESSION: 1. Interim extubation and removal of NG tube . Right IJ line stable position. 2. Cardiomegaly. No evidence of overt congestive heart failure. No focal pulmonary infiltrate. Electronically  Signed   ByMaisie Fus  Register   On: 01/07/2016 07:16   Dg Chest Port 1 View  01/06/2016  CLINICAL DATA:  Pulmonary edema . EXAM: PORTABLE CHEST 1 VIEW COMPARISON:  01/05/2016. FINDINGS: Endotracheal tube, NG tube, right IJ line in stable position. Heart size normal. Interim near complete clearing of bilateral pulmonary infiltrates. No pleural effusion or pneumothorax. IMPRESSION: 1.  Lines and tubes in stable position. 2. Interim near complete clearing of bilateral pulmonary infiltrates and/or edema. Electronically Signed   By: Maisie Fus  Register   On: 01/06/2016 07:34      Medications:     Scheduled Medications: . aspirin  81 mg Oral Daily  . atorvastatin  80 mg Oral q1800  . clopidogrel  75 mg Oral Daily   . digoxin  0.125 mg Oral Daily  . furosemide  40 mg Oral Daily  . insulin aspart  0-15 Units Subcutaneous Q4H  . ipratropium-albuterol  3 mL Nebulization Q6H  . nitroGLYCERIN  5-200 mcg/min Intravenous Once  . pantoprazole  40 mg Oral Q1200  . sodium chloride flush  10-40 mL Intracatheter Q12H  . spironolactone  12.5 mg Oral Daily  . warfarin  4 mg Oral ONCE-1800  . Warfarin - Pharmacist Dosing Inpatient   Does not apply q1800    Infusions: . heparin 1,150 Units/hr (01/07/16 1430)    PRN Medications: hydrALAZINE, sodium chloride flush   Assessment:   1. Acute Respiratory Failure 2. 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 ? HCAP 4. CAD- 09/2015 DES to LAD  5. CVA 09/2015  6. MRSA bacteremia 7. Hypokalemia    Plan/Discussion:    Much improved. Co-ox and CVP look good. Will start low-dose Entresto. Supp K+  Agree with CCM, I do not see clear indication for warfarin - suspect he was on for CVA - but no clear documentation of AF or LV thrombus. Has been switched to ASA/Plavix.  Possibly home tomorrow with close f/u in HF Clinic.    Length of Stay: 2   Arvilla Meres MD 01/07/2016, 3:05 PM  Advanced Heart Failure Team Pager [phone] (M-F; 7a - 4p)  Please contact [facility] for night-coverage after hours (4p -7a ) and weekends on [email or web address]

## 2016-01-07 NOTE — Progress Notes (Signed)
ANTICOAGULATION CONSULT NOTE - Follow Up Consult  Pharmacy Consult for heparin Indication: stroke  No Known Allergies  Patient Measurements: Height:  (167.6 cm) Weight: 150 lb 9.2 oz (68.3 kg) IBW/kg (Calculated) : 63.8 Heparin Dosing Weight: 60.8 kg  Vital Signs: Temp: 98 F (36.7 C) (06/16 1152) Temp Source: Oral (06/16 1152) BP: 124/75 mmHg (06/16 1300) Pulse Rate: 82 (06/16 1300)  Labs:  Recent Labs  01/05/16 0935  01/05/16 1024 01/05/16 1310  01/05/16 1822 01/05/16 2230 01/05/16 2244 01/06/16 0500 01/06/16 1839 01/07/16 0450 01/07/16 1016  HGB  --   < > 8.9*  --   --   --   --   --  8.2*  --  7.9*  --   HCT  --   --  31.0*  --   --   --   --   --  27.5*  --  26.6*  --   PLT  --   --  246  --   --   --   --   --  241  --  251  --   LABPROT  --   --   --   --   < >  --   --  20.8* 20.0*  --   --  19.6*  INR  --   --   --   --   < >  --   --  1.79* 1.70*  --   --  1.66*  HEPARINUNFRC  --   --   --   --   --   --   --   --   --  0.23* 0.10*  --   CREATININE 1.40*  --   --   --   --   --   --   --  1.59*  --  1.50*  --   TROPONINI  --   --   --  0.08*  --  0.08* 0.11*  --   --   --   --   --   < > = values in this interval not displayed.  Estimated Creatinine Clearance: 44.9 mL/min (by C-G formula based on Cr of 1.5).   Medical History: Past Medical History  Diagnosis Date  . PUD (peptic ulcer disease)   . CAD (coronary artery disease)     a. STEMI 09/2015 w/ DES to Prox LAD  . CVA (cerebral infarction)     a. 09/2015: acute small right frontal lobe infarct    Medications:  Scheduled:  . aspirin  81 mg Oral Daily  . atorvastatin  80 mg Oral q1800  . clopidogrel  75 mg Oral Daily  . digoxin  0.125 mg Oral Daily  . furosemide  40 mg Oral Daily  . insulin aspart  0-15 Units Subcutaneous Q4H  . ipratropium-albuterol  3 mL Nebulization Q6H  . nitroGLYCERIN  5-200 mcg/min Intravenous Once  . spironolactone  12.5 mg Oral Daily    Assessment: 65 yo  man admitted 01/05/2016 with respiratory failure on warfarin PTA for hx CVA. Pharmacy consulted to dose heparin.  PMH CAD s/p DES, hx CVA, PUD, HFrEF (15-20%), HTN  HL 0.2, subtherapeutic on heparin 1000 units/h. INR 1.64 on admission, now 1.66 today. Hgb 7.9, plt wnl. No noted bleeding. Restarting warfarin, day 1 with heparin bridge.  Goal of Therapy:  Heparin level 0.3-0.5 units/ml Monitor platelets by anticoagulation protocol: Yes   Plan:  Heparin 1150 units/h Warfarin 5 mg po x 1  tonight 2200 HL Daily HL, CBC, INR Monitor s/sx bleeding   Hillery Aldo, [address].D., BCPS PGY2 Cardiology Pharmacy Resident Pager: [phone] 01/07/2016,1:30 PM

## 2016-01-07 NOTE — Progress Notes (Addendum)
NURSING PROGRESS NOTE  SABEN STUP [id number] Transfer Data: 01/07/2016 2:45 PM Attending Provider: Leslye Peer, MD JTT:[id number] Kathlynn Grate, MD Code Status: Full  Eddie [name] is a 65 y.o. male patient transferred from 2 M14 -No acute distress noted.  -No complaints of shortness of breath.  -No complaints of chest pain.   Cardiac Monitoring: Box # 18 in place.   Blood pressure 122/67, pulse 85, temperature 98 F (36.7 C), temperature source Oral, resp. rate 19, height 5\' 6"  (1.676 m), weight 68.3 kg (150 lb 9.2 oz), SpO2 97 %.   IV Fluids:  IV in place, may refer to [facility].   Allergies:  Review of patient's allergies indicates no known allergies.  Past Medical History:   has a past medical history of PUD (peptic ulcer disease); CAD (coronary artery disease); and CVA (cerebral infarction).  Past Surgical History:   has past surgical history that includes Repair of Peptic Ulcer; Cardiac catheterization (N/A, 09/28/2015); and Cardiac catheterization (N/A, 10/08/2015).  Social History:   reports that he has been smoking.  He does not have any smokeless tobacco history on file. He reports that he does not drink alcohol.  Skin: Intacat  Patient/Family orientated to room. Information packet given to patient/family. Admission inpatient armband information verified with patient/family to include name and date of birth and placed on patient arm. Side rails up x 2, fall assessment and education completed with patient/family. Patient/family able to verbalize understanding of risk associated with falls and verbalized understanding to call for assistance before getting out of bed. Call light within reach. Patient/family able to voice and demonstrate understanding of unit orientation instructions.    Will continue to evaluate and treat per MD orders.

## 2016-01-07 NOTE — Progress Notes (Signed)
ANTICOAGULATION CONSULT NOTE - Follow Up Consult  Pharmacy Consult for heparin Indication: h/o stroke  Labs:  Recent Labs  01/05/16 0935  01/05/16 1024 01/05/16 1310 01/05/16 1350 01/05/16 1822 01/05/16 2230 01/05/16 2244 01/06/16 0500 01/06/16 1839 01/07/16 0450  HGB  --   < > 8.9*  --   --   --   --   --  8.2*  --  7.9*  HCT  --   --  31.0*  --   --   --   --   --  27.5*  --  26.6*  PLT  --   --  246  --   --   --   --   --  241  --  251  LABPROT  --   --   --   --  19.4*  --   --  20.8* 20.0*  --   --   INR  --   --   --   --  1.64*  --   --  1.79* 1.70*  --   --   HEPARINUNFRC  --   --   --   --   --   --   --   --   --  0.23* 0.10*  CREATININE 1.40*  --   --   --   --   --   --   --  1.59*  --   --   TROPONINI  --   --   --  0.08*  --  0.08* 0.11*  --   --   --   --   < > = values in this interval not displayed.    Assessment: 65yo male subtherapeutic on heparin with lower level despite increased rate, no gtt issues overnight per RN.  Goal of Therapy:  Heparin level 0.3-0.7 units/ml   Plan:  Will increase heparin gtt by 2-3 units/kg/hr to 1000 units/hr and check level in 6-8hr.  Vernard Gambles, PharmD, BCPS  01/07/2016,6:02 AM

## 2016-01-07 NOTE — Progress Notes (Signed)
ANTICOAGULATION CONSULT NOTE - Follow Up Consult  Pharmacy Consult for heparin Indication: h/o stroke  Labs:  Recent Labs  01/05/16 0935  01/05/16 1024 01/05/16 1310  01/05/16 1822 01/05/16 2230 01/05/16 2244 01/06/16 0500  01/07/16 0450 01/07/16 1016 01/07/16 1244 01/07/16 2224  HGB  --   < > 8.9*  --   --   --   --   --  8.2*  --  7.9*  --   --   --   HCT  --   --  31.0*  --   --   --   --   --  27.5*  --  26.6*  --   --   --   PLT  --   --  246  --   --   --   --   --  241  --  251  --   --   --   LABPROT  --   --   --   --   < >  --   --  20.8* 20.0*  --   --  19.6*  --   --   INR  --   --   --   --   < >  --   --  1.79* 1.70*  --   --  1.66*  --   --   HEPARINUNFRC  --   --   --   --   --   --   --   --   --   < > 0.10*  --  0.20* 0.27*  CREATININE 1.40*  --   --   --   --   --   --   --  1.59*  --  1.50*  --   --   --   TROPONINI  --   --   --  0.08*  --  0.08* 0.11*  --   --   --   --   --   --   --   < > = values in this interval not displayed.    Assessment: 65yo male remains subtherapeutic on heparin though closer to goal after rate increases.  Goal of Therapy:  Heparin level 0.3-0.7 units/ml   Plan:  Will increase heparin gtt by 1-2 units/kg/hr to 1250 units/hr and check level w/ am labs.  Vernard Gambles, PharmD, BCPS  01/07/2016,11:03 PM

## 2016-01-07 NOTE — Progress Notes (Signed)
Pt is refusing lab draw for his heparin level pt states "I'm tired of being stuck". RN explained to PT that this needs to be drawn d/t pt being on the heparin drip and pt still is refusing. On call paged and notified. Awaiting further orders. Will continue to monitor pt.

## 2016-01-07 NOTE — Progress Notes (Signed)
PULMONARY / CRITICAL CARE MEDICINE   Name: Eddie [name] MRN: [id number] DOB: 10/18/1950    ADMISSION DATE:  01/05/2016  REFERRING MD:  Eddie [name])  CHIEF COMPLAINT:  Respiratory failure   HISTORY OF PRESENT ILLNESS:   65yo male with hx CAD, CVA, multiple recent admissions for acute respiratory failure (5/20-5/24 and 5/29-6/2) in the setting of decompensated CHF and EColi bacteremia.  He returned 6/14 after calling EMS with c/o SOB.  On EMS arrival he was awake and able to ambulate to their stretcher then became unresponsive and intubated with [facility] airway by EMS.  Reintubated with ETT on arrival to ER.  In ER had significant hypertension, ongoing AMS, mild AKI.    SUBJECTIVE:  Awake and alert   VITAL SIGNS: BP 146/89 mmHg  Pulse 96  Temp(Src) 98 F (36.7 C) (Oral)  Resp 26  Ht  (1.676 m)  Wt 150 lb 9.2 oz (68.3 kg)  BMI 24.31 kg/m2  SpO2 97%  HEMODYNAMICS: CVP:  [4 mmHg-5 mmHg] 4 mmHg  VENTILATOR SETTINGS:    INTAKE / OUTPUT: I/O last 3 completed shifts: In: 974.5 [I.V.:204.5; Other:320; IV Piggyback:450] Out: 3550 [Urine:3550]  PHYSICAL EXAMINATION: General:  Awake and alert, sitting on side of bed Neuro:  Awake, alert, interacting, following commands HEENT:  Mm moist, JVD +, pupils 4mm  Cardiovascular:  s1s2 rrr Lungs:  resps even non labored on 2 l [place] Abdomen:  Round, soft, non tender Musculoskeletal:  Warm and dry, scant BLE edema   LABS:  BMET  Recent Labs Lab 01/05/16 0935 01/06/16 0500 01/07/16 0450  NA 138 139 141  K 4.9 3.6 3.4*  CL 109 102 103  CO2 20* 25 29  BUN 11 22* 28*  CREATININE 1.40* 1.59* 1.50*  GLUCOSE 287* 151* 118*    Electrolytes  Recent Labs Lab 01/05/16 0935 01/05/16 1310 01/06/16 0500 01/07/16 0450  CALCIUM 8.2*  --  8.4* 8.4*  MG  --  2.4 1.9 2.0  PHOS  --  6.7* 4.2 3.7    CBC  Recent Labs Lab 01/05/16 1024 01/06/16 0500 01/07/16 0450  WBC 7.5 10.8* 15.4*  HGB 8.9* 8.2* 7.9*  HCT 31.0* 27.5* 26.6*   PLT 246 241 251    Coag's  Recent Labs Lab 01/05/16 1350 01/05/16 2244 01/06/16 0500  INR 1.64* 1.79* 1.70*    Sepsis Markers  Recent Labs Lab 01/05/16 0948 01/05/16 1310  LATICACIDVEN 5.55* 0.8  PROCALCITON  --  0.20    ABG  Recent Labs Lab 01/05/16 1251 01/05/16 1614  PHART 7.172* 7.402  PCO2ART 74.2* 42.7  PO2ART 197* 165.0*    Liver Enzymes  Recent Labs Lab 01/05/16 0935  AST 43*  ALT 23  ALKPHOS 105  BILITOT 1.0  ALBUMIN 3.0*    Cardiac Enzymes  Recent Labs Lab 01/05/16 1310 01/05/16 1822 01/05/16 2230  TROPONINI 0.08* 0.08* 0.11*    Glucose  Recent Labs Lab 01/06/16 0719 01/06/16 1203 01/06/16 1505 01/06/16 2011 01/06/16 2355 01/07/16 0827  GLUCAP 149* 156* 301* 375* 140* 116*    Imaging Dg Chest Port 1 View  01/07/2016  CLINICAL DATA:  Respiratory failure. EXAM: PORTABLE CHEST 1 VIEW COMPARISON:  01/06/2016. FINDINGS: Interim extubation removal of NG tube. Right IJ line stable position. Mediastinum and hilar structures normal. Cardiomegaly with normal pulmonary vascularity. No focal infiltrate. No pleural effusion or pneumothorax. IMPRESSION: 1. Interim extubation and removal of NG tube . Right IJ line stable position. 2. Cardiomegaly. No evidence of overt congestive heart  failure. No focal pulmonary infiltrate. Electronically Signed   By: Maisie Fus  Register   On: 01/07/2016 07:16     STUDIES:  CT head 6/14>>> neg  CULTURES: Urine 6/14>>> negative BC x 2 6/14>>> staph coag neg >> MRSE  ANTIBIOTICS: Vanc 6/14>>>dc Zosyn 6/14>>>dc  SIGNIFICANT EVENTS:   LINES/TUBES: ETT 6/14>>>6/15  DISCUSSION: 65yo male with recurrent VDRF in setting decompensated CHF and pulmonary edema. 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  ??compliance at home.  Pulmonary edema  HTN crisis  CAD s/p DES  P:  BP control  Continue diuresis  Trend troponin, BNP Heart failure team following Continue home asa, lipitor, coreg Heparin gtt per pharmacy for now for possible ACS. Unclear what his long term indication for anticoagulation is. We can use asa and plavix as alternative for CVA. Believe this needs to be clarified by cardiology   RENAL AKI on CKD2.  Baseline Scr ~1.1.  P:   F/u chem  Monitor with diuresis, may need to decrease depending on renal status  GASTROINTESTINAL No active issue  P:   Heart healthy diet PPI   HEMATOLOGIC Anticoagulation needs  Anemia of chronic disease  P:  F/u cbc  Heparin gtt for now as above, but unclear that there is a true indication here for warfarin. Will discuss w cards, Transition back to coumadin  INFECTIOUS ?HCAP -- more likely pulmonary edema  Recent EColi UTI with bacteremia  1 of 2 blood cx MRSE, probable contaminant  P:   Treated empirically for HCAP but infiltrates quickly resolved. Improving off abx. MRSE in 1 blood cx is almost certainly a contaminant. Would not restart vanco based on this. Would repeat his blood cx if he has fever or clinical change.    ENDOCRINE DM   P:   SSI   NEUROLOGIC AMS - r/t hypoxia, hypercarbia, HTN  Previous CVA march 2017, on coumadin but ? True indication P:   RASS goal: 0 CT head neg  Awake and alert   Transfer to tele. Suspect he will dc home soon  FAMILY  - Updates:  Wife updated at bedside 6/14 in ER   - Inter-disciplinary family meet or Palliative Care meeting due by:  6/21   [name] ACNP [facility] PCCM Pager [phone] till 3 pm If no answer page [phone] 01/07/2016, 9:27 AM   Attending Note:  I have examined patient, reviewed labs, studies and notes. I have discussed the case  with S Minor, and I agree with the data and plans as amended above. 65 yo man with severe cardiomyopathy, admitted with acute respiratory failure, shock and lactic acidosis requiring MV. He quickly cleared his B infiltrates. Blood cx 1 of 2 MRSE, likely a contaminant. On my eval he is awake, alert. Lung exam improved with a few basilar crackles. Hemodynamically stable. His co-ox is improved. He should be able to transfer out of ICU. I will defer abx for MRSE unless he clinically changes. Check repeat blood cx's if he develops fever.  Will need to discuss w cardiology whether he has a true indication for coumadin. He has a hx of CVA but I do not see anything  in the record about A fib or an embolic process.   Levy Pupa, MD, PhD 01/07/2016, 2:09 PM [facility] [phone] or if no answer [phone]

## 2016-01-07 NOTE — Evaluation (Signed)
Physical Therapy Evaluation Patient Details Name: Eddie [name] MRN: [id number] DOB: 24-Dec-1950 Today's Date: 01/07/2016   History of Present Illness  65yo male with hx CAD, CVA, multiple recent admissions for acute respiratory failure (5/20-5/24 and 5/29-6/2) in the setting of decompensated CHF and EColi bacteremia. He returned 6/14 after calling EMS with c/o SOB. On EMS arrival he was awake and able to ambulate to their stretcher then became unresponsive and intubated with [facility] airway by EMS. Reintubated with ETT on arrival to ER. In ER had significant hypertension, ongoing AMS, mild AKI.   Clinical Impression  Pt admitted with above diagnosis. Pt currently with functional limitations due to the deficits listed below (see PT Problem List). Pt was able to ambulate on unit with cane without LOB with good safety with cane.  Wife and pt agree that pt is close to baseline.  Do not want pt to lose his mobility therefore will check on pt on Mon if still in hospital to ensure mobility.  Asked Paticia Stack to ambulate pt on unit at least 2x day and she agrees.   Pt will benefit from skilled PT to increase their independence and safety with mobility to allow discharge to the venue listed below.      Follow Up Recommendations Outpatient PT (resume)    Equipment Recommendations  None recommended by PT    Recommendations for Other Services       Precautions / Restrictions Precautions Precautions: Fall Restrictions Weight Bearing Restrictions: No      Mobility  Bed Mobility Overal bed mobility: Modified Independent                Transfers Overall transfer level: Modified independent                  Ambulation/Gait Ambulation/Gait assistance: Supervision;Min guard Ambulation Distance (Feet): 560 Feet Assistive device: Straight cane Gait Pattern/deviations: Step-through pattern;Decreased stride length   Gait velocity interpretation: at or above normal speed for  age/gender General Gait Details: Pt needed occasional cues for sequencing of cane but no LOB with ambulation with cane.  Pt wife supportive.  Pt appears close to baseline.    Stairs            Wheelchair Mobility    Modified Rankin (Stroke Patients Only)       Balance Overall balance assessment: Needs assistance Sitting-balance support: No upper extremity supported;Feet supported Sitting balance-Leahy Scale: Good     Standing balance support: Single extremity supported;During functional activity Standing balance-Leahy Scale: Fair Standing balance comment: can stand statically and balance without UE support or with one UE support.               High level balance activites: Direction changes;Turns;Sudden stops High Level Balance Comments: Does well with above with cane.               Pertinent Vitals/Pain  No c/o pain.  VSS    Home Living Family/patient expects to be discharged to:: Private residence Living Arrangements: Spouse/significant other Available Help at Discharge: Family Type of Home: House Home Access: Stairs to enter   Secretary/administrator of Steps: 1 Home Layout: One level Home Equipment: Cane - single point Additional Comments: Worked prior to recent CVA.    Prior Function Level of Independence: Needs assistance   Gait / Transfers Assistance Needed: Amb with cane outside and no device inside. Currently receiving OPPT.     Comments: wife works Sun-Tues 8 am to [employer].  [employer]  with pt when wife not there     Hand Dominance   Dominant Hand: Right    Extremity/Trunk Assessment   Upper Extremity Assessment: Defer to OT evaluation           Lower Extremity Assessment: Generalized weakness      Cervical / Trunk Assessment: Normal  Communication   Communication: No difficulties  Cognition Arousal/Alertness: Awake/alert Behavior During Therapy: WFL for tasks assessed/performed Overall Cognitive Status: Within Functional  Limits for tasks assessed                      General Comments      Exercises General Exercises - Lower Extremity Ankle Circles/Pumps: AROM;Both;10 reps;Supine Long Arc Quad: AROM;Both;10 reps;Seated      Assessment/Plan    PT Assessment Patient needs continued PT services  PT Diagnosis Generalized weakness   PT Problem List Decreased mobility;Decreased activity tolerance;Decreased knowledge of use of DME;Decreased safety awareness  PT Treatment Interventions DME instruction;Gait training;Functional mobility training;Therapeutic activities;Therapeutic exercise;Balance training;Patient/family education   PT Goals (Current goals can be found in the Care Plan section) Acute Rehab PT Goals Patient Stated Goal: Return home and to OPPT PT Goal Formulation: With patient Time For Goal Achievement: 01/21/16 Potential to Achieve Goals: Good    Frequency Min 3X/week   Barriers to discharge        Co-evaluation               End of Session Equipment Utilized During Treatment: Gait belt Activity Tolerance: Patient limited by fatigue Patient left: in chair;with call bell/phone within reach;with family/visitor present Nurse Communication: Mobility status         Time: 1213-1226 PT Time Calculation (min) (ACUTE ONLY): 13 min   Charges:   PT Evaluation $PT Eval Moderate Complexity: 1 Procedure     PT G CodesBerline Lopes 2016/01/24, 3:22 PM [facility] Acute Rehabilitation [phone] (pager)

## 2016-01-08 DIAGNOSIS — I110 Hypertensive heart disease with heart failure: Secondary | ICD-10-CM

## 2016-01-08 DIAGNOSIS — I10 Essential (primary) hypertension: Secondary | ICD-10-CM | POA: Insufficient documentation

## 2016-01-08 DIAGNOSIS — I6340 Cerebral infarction due to embolism of unspecified cerebral artery: Secondary | ICD-10-CM

## 2016-01-08 LAB — BASIC METABOLIC PANEL WITH GFR
Anion gap: 8 (ref 5–15)
BUN: 24 mg/dL — ABNORMAL HIGH (ref 6–20)
CO2: 27 mmol/L (ref 22–32)
Calcium: 8.5 mg/dL — ABNORMAL LOW (ref 8.9–10.3)
Chloride: 103 mmol/L (ref 101–111)
Creatinine, Ser: 1.17 mg/dL (ref 0.61–1.24)
GFR calc Af Amer: >60 mL/min
GFR calc non Af Amer: >60 mL/min
Glucose, Bld: 119 mg/dL — ABNORMAL HIGH (ref 65–99)
Potassium: 3.5 mmol/L (ref 3.5–5.1)
Sodium: 138 mmol/L (ref 135–145)

## 2016-01-08 LAB — GLUCOSE, CAPILLARY
GLUCOSE-CAPILLARY: 213 mg/dL — AB (ref 65–99)
GLUCOSE-CAPILLARY: 231 mg/dL — AB (ref 65–99)
GLUCOSE-CAPILLARY: 282 mg/dL — AB (ref 65–99)
Glucose-Capillary: 157 mg/dL — ABNORMAL HIGH (ref 65–99)

## 2016-01-08 LAB — CULTURE, BLOOD (ROUTINE X 2)

## 2016-01-08 LAB — CBC
HCT: 28.3 % — ABNORMAL LOW (ref 39.0–52.0)
Hemoglobin: 8.4 g/dL — ABNORMAL LOW (ref 13.0–17.0)
MCH: 24.6 pg — ABNORMAL LOW (ref 26.0–34.0)
MCHC: 29.7 g/dL — ABNORMAL LOW (ref 30.0–36.0)
MCV: 82.7 fL (ref 78.0–100.0)
PLATELETS: 252 10*3/uL (ref 150–400)
RBC: 3.42 MIL/uL — AB (ref 4.22–5.81)
RDW: 14.7 % (ref 11.5–15.5)
WBC: 8.9 10*3/uL (ref 4.0–10.5)

## 2016-01-08 LAB — PROTIME-INR
INR: 1.4 (ref 0.00–1.49)
INR: 1.45 (ref 0.00–1.49)
PROTHROMBIN TIME: 17.2 s — AB (ref 11.6–15.2)
PROTHROMBIN TIME: 17.7 s — AB (ref 11.6–15.2)

## 2016-01-08 LAB — HEPARIN LEVEL (UNFRACTIONATED)
HEPARIN UNFRACTIONATED: 0.26 [IU]/mL — AB (ref 0.30–0.70)
Heparin Unfractionated: 0.32 [IU]/mL (ref 0.30–0.70)

## 2016-01-08 MED ORDER — INSULIN ASPART 100 UNIT/ML ~~LOC~~ SOLN
0.0000 [IU] | Freq: Every day | SUBCUTANEOUS | Status: DC
  Administered 2016-01-08: 3 [IU] via SUBCUTANEOUS

## 2016-01-08 MED ORDER — INSULIN ASPART 100 UNIT/ML ~~LOC~~ SOLN
0.0000 [IU] | Freq: Three times a day (TID) | SUBCUTANEOUS | Status: DC
  Administered 2016-01-09: 8 [IU] via SUBCUTANEOUS
  Administered 2016-01-09: 3 [IU] via SUBCUTANEOUS

## 2016-01-08 MED ORDER — POTASSIUM CHLORIDE CRYS ER 20 MEQ PO TBCR
20.0000 meq | EXTENDED_RELEASE_TABLET | Freq: Once | ORAL | Status: AC
  Administered 2016-01-08: 20 meq via ORAL
  Filled 2016-01-08: qty 1

## 2016-01-08 MED ORDER — IPRATROPIUM-ALBUTEROL 0.5-2.5 (3) MG/3ML IN SOLN
3.0000 mL | Freq: Two times a day (BID) | RESPIRATORY_TRACT | Status: DC
  Administered 2016-01-09: 3 mL via RESPIRATORY_TRACT
  Filled 2016-01-08: qty 3

## 2016-01-08 MED ORDER — WARFARIN SODIUM 7.5 MG PO TABS
7.5000 mg | ORAL_TABLET | Freq: Once | ORAL | Status: AC
  Administered 2016-01-08: 7.5 mg via ORAL
  Filled 2016-01-08: qty 1

## 2016-01-08 MED ORDER — SACUBITRIL-VALSARTAN 49-51 MG PO TABS
1.0000 | ORAL_TABLET | Freq: Two times a day (BID) | ORAL | Status: DC
  Administered 2016-01-08 – 2016-01-09 (×3): 1 via ORAL
  Filled 2016-01-08 (×4): qty 1

## 2016-01-08 MED ORDER — POTASSIUM CHLORIDE CRYS ER 20 MEQ PO TBCR
40.0000 meq | EXTENDED_RELEASE_TABLET | Freq: Once | ORAL | Status: AC
  Administered 2016-01-08: 40 meq via ORAL
  Filled 2016-01-08: qty 2

## 2016-01-08 NOTE — Care Management Note (Addendum)
Case Management Note  Patient Details  Name: Eddie [name] MRN: [id number] Date of Birth: 1951/04/20  Subjective/Objective:        Pt admitted for acute resp failure            Action/Plan:  Pt from home with wife.  Pt currently going to outpt PT and Speech therapy with [facility] for the past month.  Pt has had multiple admits recently.  HF consulted.  Pt states his wife does not cook with salt, doesn't currently have working scale but stated he would pick one up day of discharge.  Pt states he has been compliant with both home diuretics - CM went over medications from home and informed which ones were on [facility] generic list.  Pt has active insurance - would definitely benefit from [facility] for disease management but due to hardship can not pay copay (multiple doctor appt and medications all requiring copays)  - CM reached out to [facility] agency to determine if pt would have copay.  CM requested pt stay in close contact with PCP regarding HF symptoms and daily weight changes.  CM also requested that pt form a list of current medication and provide them to each doctor that he sees.  CM will consult with HF nurse.   Expected Discharge Date:  01/13/16               Expected Discharge Plan:  Home w Home Health Services  In-House Referral:     Discharge planning Services  CM Consult  Post Acute Care Choice:    Choice offered to:     DME Arranged:    DME Agency:     HH Arranged:    HH Agency:     Status of Service:  In process, will continue to follow  Medicare Important Message Given:    Date Medicare IM Given:    Medicare IM give by:    Date Additional Medicare IM Given:    Additional Medicare Important Message give by:     If discussed at Long Length of Stay Meetings, dates discussed:    Additional Comments: 01/08/16 CM was informed by agency that pt would not have a copay with [facility] as long as he was able to catch his monthly premiums up prior to July - CM communicated this to pt and both he and  his wife stated they were already planning on bringing the insurance premiums up to date - both are in agreement for [facility] for disease management - offered choice - they chose [facility].  CM requested HH orders from attending via sticky notes in addition to requesting  5W bedside nurse Lonnie to ensure orders are written and CM is consulted/services arranged prior to discharge. [facility] is aware of tentative referral for this pt.   Pt is already set up with HF clinic for close following.    HF Nurse assessed pt and provided scale Cherylann Parr, RN 01/08/2016, 10:12 AM

## 2016-01-08 NOTE — Progress Notes (Signed)
Advanced Heart Failure Rounding Note   Subjective:     Extubated 6/15. Feels better. Bcx 2/2 coag-neg staph  Entresto started yesterday. Tolerating well. BP remains high. Renal function improving. Wants to go home   Objective:   Weight Range:  Vital Signs:   Temp:  [98 F (36.7 C)-98.7 F (37.1 C)] 98.4 F (36.9 C) (06/17 0545) Pulse Rate:  [82-98] 92 (06/17 0545) Resp:  [15-26] 20 (06/17 0545) BP: (114-156)/(59-89) 146/89 mmHg (06/17 0545) SpO2:  [95 %-100 %] 99 % (06/17 0943) Weight:  [67.2 kg (148 lb 2.4 oz)-67.858 kg (149 lb 9.6 oz)] 67.2 kg (148 lb 2.4 oz) (06/17 0545) Last BM Date: 01/02/16  Weight change: Filed Weights   01/07/16 0500 01/07/16 1509 01/08/16 0545  Weight: 68.3 kg (150 lb 9.2 oz) 67.858 kg (149 lb 9.6 oz) 67.2 kg (148 lb 2.4 oz)    Intake/Output:   Intake/Output Summary (Last 24 hours) at 01/08/16 0956 Last data filed at 01/08/16 0546  Gross per 24 hour  Intake    872 ml  Output   1850 ml  Net   -978 ml     Physical Exam: General:  Sitting up on side of bed in NAD HEENT: normal Neck: supple. JVP 5 . Carotids 2+ bilat; no bruits. No lymphadenopathy or thryomegaly appreciated. Cor: PMI laterally displaced. Regular rate & rhythm. No rubs, gallops or murmurs. Lungs: clear Abdomen: soft, nontender, nondistended. No hepatosplenomegaly. No bruits or masses. Good bowel sounds. Extremities: no cyanosis, clubbing, rash, no  edema Neuro: alert & orientedx3, cranial nerves grossly intact. moves all 4 extremities w/o difficulty. Affect pleasant  Telemetry: SR/S tach   Labs: Basic Metabolic Panel:  Recent Labs Lab 01/05/16 0935 01/05/16 1310 01/06/16 0500 01/07/16 0450 01/08/16 0520  NA 138  --  139 141 138  K 4.9  --  3.6 3.4* 3.5  CL 109  --  102 103 103  CO2 20*  --  25 29 27   GLUCOSE 287*  --  151* 118* 119*  BUN 11  --  22* 28* 24*  CREATININE 1.40*  --  1.59* 1.50* 1.17  CALCIUM 8.2*  --  8.4* 8.4* 8.5*  MG  --  2.4 1.9 2.0   --   PHOS  --  6.7* 4.2 3.7  --     Liver Function Tests:  Recent Labs Lab 01/05/16 0935  AST 43*  ALT 23  ALKPHOS 105  BILITOT 1.0  PROT 7.4  ALBUMIN 3.0*   No results for input(s): LIPASE, AMYLASE in the last 168 hours. No results for input(s): AMMONIA in the last 168 hours.  CBC:  Recent Labs Lab 01/05/16 1024 01/06/16 0500 01/07/16 0450 01/08/16 0520  WBC 7.5 10.8* 15.4* 8.9  HGB 8.9* 8.2* 7.9* 8.4*  HCT 31.0* 27.5* 26.6* 28.3*  MCV 88.1 82.3 81.8 82.7  PLT 246 241 251 252    Cardiac Enzymes:  Recent Labs Lab 01/05/16 1310 01/05/16 1822 01/05/16 2230  TROPONINI 0.08* 0.08* 0.11*    BNP: BNP (last 3 results)  Recent Labs  12/11/15 0720 12/20/15 1928 01/05/16 1310  BNP 492.7* 1475.7* 1148.9*    ProBNP (last 3 results) No results for input(s): PROBNP in the last 8760 hours.    Other results:  Imaging: Dg Chest Port 1 View  01/07/2016  CLINICAL DATA:  Respiratory failure. EXAM: PORTABLE CHEST 1 VIEW COMPARISON:  01/06/2016. FINDINGS: Interim extubation removal of NG tube. Right IJ line stable position. Mediastinum and hilar structures  normal. Cardiomegaly with normal pulmonary vascularity. No focal infiltrate. No pleural effusion or pneumothorax. IMPRESSION: 1. Interim extubation and removal of NG tube . Right IJ line stable position. 2. Cardiomegaly. No evidence of overt congestive heart failure. No focal pulmonary infiltrate. Electronically Signed   By: Maisie Fus  Register   On: 01/07/2016 07:16      Medications:     Scheduled Medications: . aspirin  81 mg Oral Daily  . atorvastatin  80 mg Oral q1800  . clopidogrel  75 mg Oral Daily  . digoxin  0.125 mg Oral Daily  . furosemide  40 mg Oral Daily  . insulin aspart  0-15 Units Subcutaneous Q4H  . ipratropium-albuterol  3 mL Nebulization Q6H  . nitroGLYCERIN  5-200 mcg/min Intravenous Once  . pantoprazole  40 mg Oral Q1200  . sacubitril-valsartan  1 tablet Oral BID  . sodium chloride  flush  10-40 mL Intracatheter Q12H  . spironolactone  12.5 mg Oral Daily  . Warfarin - Pharmacist Dosing Inpatient   Does not apply q1800    Infusions: . heparin 1,250 Units/hr (01/07/16 2319)    PRN Medications: hydrALAZINE, sodium chloride flush   Assessment:   1. Acute Respiratory Failure 2. 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 ? HCAP 4. CAD- 09/2015 DES to LAD  5. CVA 09/2015  6. MRSA bacteremia 7. Hypokalemia 8. HTN   Plan/Discussion:    Much improved. Tolerating Entresto. BP actually high. Increase Entresto to 49/51. Increase spiro to 25. Would keep another day for med titration and to get BP under control. May need Bidil.   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 Started in 4/17. No evidence of AF or LV thrombus on work-up. No need for heparin bridge. Continue warfarin.   Possibly home tomorrow with close f/u in HF Clinic.    Length of Stay: 3  Arvilla Meres MD 01/08/2016, 9:56 AM  Advanced Heart Failure Team Pager [phone] (M-F; 7a - 4p)  Please contact [facility] for night-coverage after hours (4p -7a ) and weekends on [email or web address]

## 2016-01-08 NOTE — Progress Notes (Signed)
Initial Nutrition Assessment  DOCUMENTATION CODES:  Not applicable  INTERVENTION:  Re-educated on HF diet. Poor dietary compliance is likely playing part in his readmissions. He stated he ate 2 hotdogs just before calling EMS this admission.   Concerned about patients ability to follow diet on own. He appears to have very poor health literacy (he said he doesn't have heart disease). High risk for ongoing dietary noncompliance.   NUTRITION DIAGNOSIS:  Limited adherence to nutrition-related recommendations related to limited prior education vs poor health literacy as evidenced by Report of eating very high sodium foods in excess.  GOAL:  Patient will meet greater than or equal to 90% of their needs  MONITOR:  I & O's, Labs, PO intake  REASON FOR ASSESSMENT:  Malnutrition Screening Tool    ASSESSMENT:  65 y/o Male PMHx CAD, CVA, PUD, and multiple recent admissions for respiratory failure in setting of decompensated CHF and bacteremia. Intubated in ED. Also with HTN, AMS, AKI.   Pt triggered on the MST for weight loss w/o poor appetite. Thought to be related to CHF and fluid shifts. Pt reports when he came in half of his body was "like the incredible hulk" with the other side being normal. On day of admit, he was 156 lbs. He is now 148 lbs.   Per chart review, poor compliance has been suspected. Large part of encounter was re-education on the HF diet. This admission, patient has been given a folder which contains information on a low salt diet. 2000 mg Restriction is written on the list.   RD went over some of the highest sodium containing foods. He reports consuming many of them. He says he loves his bacon/sausage. He says he was instructed that he should eat the [place] variety. Explained that he has to be careful with this and it is a common misconception that [place] products are always better. Advised some [place] bacon/sausage actually has more sodium than the pork variety to make up for  the decreased fat content.  Patient also eats deli meats and canned soup. He assures RD that he just uses his tomato soup to dip his grilled cheese in. Advised cheese is another high salt food. He says he loves his cheese.   RD educated that fresh meats, vegetables, fruits are always preferred and should have 0 mg salt content if truly fresh.   We talked about how to manage his "sodium budget". There aren't foods he CANT have per se, rather he has a 2000 mg budget that he can choose how to "spend" each day. Gave examples of calculating sodium consumed by using the food label with appropriate calculations. Explained that if he has a very high salt food, he will have much less daily salt remaining in his budget. He seemed to understand this analogy.   RD asked if he had any questions. He asked if he should choose margarine or butter. Advised low sodium margarine would be better of the two choices.   Finally RD went over the importance of daily self weighing and how to use this as a feedback mechanism to determine how well he is complying with diet/meds.   Labs reviewed: Severe anemia.    Recent Labs Lab 01/05/16 1310 01/06/16 0500 01/07/16 0450 01/08/16 0520  NA  --  139 141 138  K  --  3.6 3.4* 3.5  CL  --  102 103 103  CO2  --  25 29 27   BUN  --  22* 28* 24*  CREATININE  --  1.59* 1.50* 1.17  CALCIUM  --  8.4* 8.4* 8.5*  MG 2.4 1.9 2.0  --   PHOS 6.7* 4.2 3.7  --   GLUCOSE  --  151* 118* 119*    Diet Order:  Diet Heart Room service appropriate?: Yes; Fluid consistency:: Thin  Skin:Dry  Last BM:  6/11  Height:  Ht Readings from Last 1 Encounters:  01/07/16  (1.676 m)   Weight:  Wt Readings from Last 1 Encounters:  01/08/16 148 lb 2.4 oz (67.2 kg)   Wt Readings from Last 10 Encounters:  01/08/16 148 lb 2.4 oz (67.2 kg)  12/23/15 151 lb (68.493 kg)  12/15/15 162 lb 14.4 oz (73.891 kg)  10/28/15 154 lb (69.854 kg)  10/26/15 143 lb 4.8 oz (65 kg)  10/15/15 127 lb  (57.607 kg)   Ideal Body Weight:  61.82 kg  BMI:  Body mass index is 23.92 kg/(m^2).  Estimated Nutritional Needs:  Kcal:  1700-1900 (25-28 kcal/kg bw) Protein:  67-81 g (1-1.2 g/kg bw) Fluid:  Per MD   EDUCATION NEEDS:  Education needs addressed  Christophe Louis RD, LDN, CNSC Clinical Nutrition Pager: [phone] 01/08/2016 1:01 PM

## 2016-01-08 NOTE — Progress Notes (Signed)
ANTICOAGULATION CONSULT NOTE - Follow Up Consult  Pharmacy Consult for Heparin Indication: stroke  No Known Allergies  Patient Measurements: Height: 5\' 6"  (167.6 cm) Weight: 148 lb 2.4 oz (67.2 kg) IBW/kg (Calculated) : 63.8  Vital Signs: Temp: 98.4 F (36.9 C) (06/17 0545) Temp Source: Oral (06/17 0545) BP: 146/89 mmHg (06/17 0545) Pulse Rate: 92 (06/17 0545)  Labs:  Recent Labs  01/05/16 1310  01/05/16 1822 01/05/16 2230  01/06/16 0500  01/07/16 0450 01/07/16 1016 01/07/16 1244 01/07/16 2224 01/08/16 0520  HGB  --   --   --   --   < > 8.2*  --  7.9*  --   --   --  8.4*  HCT  --   --   --   --   --  27.5*  --  26.6*  --   --   --  28.3*  PLT  --   --   --   --   --  241  --  251  --   --   --  252  LABPROT  --   < >  --   --   < > 20.0*  --   --  19.6*  --   --  17.2*  INR  --   < >  --   --   < > 1.70*  --   --  1.66*  --   --  1.40  HEPARINUNFRC  --   --   --   --   --   --   < > 0.10*  --  0.20* 0.27* 0.32  CREATININE  --   --   --   --   --  1.59*  --  1.50*  --   --   --  1.17  TROPONINI 0.08*  --  0.08* 0.11*  --   --   --   --   --   --   --   --   < > = values in this interval not displayed.  Estimated Creatinine Clearance: 57.6 mL/min (by C-G formula based on Cr of 1.17).  Assessment: 37 YOM admitted 01/05/2016 with respiratory failure on warfarin PTA for hx CVA. Pharmacy originally consulted to bridge with heparin however this has been discontinued this AM by the HF team since there is no evidence of Afib or LV thrombus on work-up that would require a heparin bridge.  INR today remains SUBtherapeutic (INR 1.4 << 1.66, goal of 2-3). CBC stable - no overt s/sx of bleeding noted.  PTA dose noted to be 5 mg daily EXCEPT for 7.5 mg on Mon/Fri  Goal of Therapy:  INR 2-3   Plan:  1. Warfarin 7.5 mg x 1 dose at 1800 today 2. Will continue to monitor for any signs/symptoms of bleeding and will follow up with PT/INR in the a.m.   Georgina Pillion, PharmD,  BCPS Clinical Pharmacist Pager: [phone] 01/08/2016 11:34 AM

## 2016-01-08 NOTE — Progress Notes (Signed)
PROGRESS NOTE    Eddie [name]  HYQ:[id number] DOB: 06/06/1951 DOA: 01/05/2016 PCP: Dorrene German, MD    Brief Narrative:  65yo male with hx CAD, CVA, multiple recent admissions for acute respiratory failure (5/20-5/24 and 5/29-6/2) in the setting of decompensated CHF and EColi bacteremia. He returned 6/14 after calling EMS with c/o SOB. On EMS arrival he was awake and able to ambulate to their stretcher then became unresponsive and intubated with [facility] airway by EMS. Reintubated with ETT on arrival to ER. In ER had significant hypertension, ongoing AMS, mild AKI. Patient initially admitted to the critical care team. Cardiology consulted. Patient diuresed improved clinically and subsequently transferred to the floor.  Assessment & Plan:   Principal Problem:   Acute respiratory failure (HCC) Active Problems:   Acute on chronic combined systolic and diastolic heart failure (HCC)   Hypokalemia   Stroke (HCC)   Type 2 diabetes mellitus with complication (HCC)   Acute systolic heart failure (HCC)   HTN (hypertension), malignant   Cerebrovascular accident (CVA) due to embolism of cerebral artery (HCC)  #1 acute respiratory failure/vent dependent respiratory failure secondary to acute on chronic systolic heart failure Patient status post vent-dependent respiratory failure secondary to acute on chronic systolic heart failure likely secondary to poor medical noncompliance and dietary indiscretion. Patient initially on the critical care team subsequently extubated with clinical improvement and transferred to the floor. Patient denies any chest pain or shortness of breath. Patient is -5 L during this hospitalization and -1.092 L over the past 24 hours. Patient is being followed by cardiology. Continue Lasix, aspirin, Plavix, digoxin, entresto, Aldactone. Patient's medications have been adjusted per cardiology. Patient will likely need close outpatient follow-up with a heart failure clinic. Per  cardiology.  #2 malignant hypertension Patient was noted on admission to have a hypertensive crisis. Blood pressure improved. Continue Lasix, Aldactone, digoxin. Entresto dose and Aldactone dose has been increased per cardiology for better blood pressure control. Follow.  #3 history of CVA Presumed to be cardioembolic. Continue Coumadin, aspirin and Plavix. Per cardiology no need for heparin bridge. Risk factor modification. Follow.  #4 hypokalemia Replete.  #5 1/2 blood cultures MRSE, probable contaminant Patient does not look septic at all. Patient is afebrile. Patient with a normal white count. Patient initially was empirically treated for healthcare associated pneumonia however infiltrates resolved quickly and a such antibiotics discontinued. Patient is afebrile. No need for and about except this time. Follow.  #6 acute encephalopathy Likely secondary to problem #1. Resolved.  #7 diabetes mellitus Sliding scale insulin.  #8 acute kidney injury on chronic kidney disease stage II Stable. At baseline. Monitor closely with diuresis.   DVT prophylaxis: SCDs Code Status: Full Family Communication: Updated patient and wife at bedside. Disposition Plan: Hopefully home tomorrow if okay with cardiology.   Consultants:   Cardiology:Dr Bensimhon 01/05/2016  Procedures:   CT head 01/05/2016  Chest x-ray 01/05/2016, 01/06/2016, 01/07/2016  ETT 6/14 >>>6/15  Antimicrobials:  Vanc 6/14>>>6/15 Zosyn 6/14>>>6/15  CULTURES: Urine 6/14>>> negative BC x 2 6/14>>> staph coag neg >> MRSE   Subjective: Patient denies any shortness of breath. No chest pain. Patient anxious to go home. Patient states cardiology states he could possibly get to go home tomorrow.  Objective: Filed Vitals:   01/08/16 0943 01/08/16 1442 01/08/16 1525 01/08/16 1956  BP:  168/78    Pulse:  47  94  Temp:  98.2 F (36.8 C)    TempSrc:      Resp:  20  18  Height:      Weight:      SpO2: 99% 97% 97%  97%    Intake/Output Summary (Last 24 hours) at 01/08/16 2021 Last data filed at 01/08/16 1442  Gross per 24 hour  Intake    750 ml  Output   1550 ml  Net   -800 ml   Filed Weights   01/07/16 0500 01/07/16 1509 01/08/16 0545  Weight: 68.3 kg (150 lb 9.2 oz) 67.858 kg (149 lb 9.6 oz) 67.2 kg (148 lb 2.4 oz)    Examination:  General exam: Appears calm and comfortable  Respiratory system: Clear to auscultation. Respiratory effort normal. Cardiovascular system: S1 & S2 heard, RRR. No JVD, murmurs, rubs, gallops or clicks. Trace bilateral lower extremity edema. Gastrointestinal system: Abdomen is nondistended, soft and nontender. No organomegaly or masses felt. Normal bowel sounds heard. Central nervous system: Alert and oriented. No focal neurological deficits. Extremities: Trace bilateral lower extremity edema. Skin: No rashes, lesions or ulcers Psychiatry: Judgement and insight appear normal. Mood & affect appropriate.     Data Reviewed: I have personally reviewed following labs and imaging studies  CBC:  Recent Labs Lab 01/05/16 1024 01/06/16 0500 01/07/16 0450 01/08/16 0520  WBC 7.5 10.8* 15.4* 8.9  HGB 8.9* 8.2* 7.9* 8.4*  HCT 31.0* 27.5* 26.6* 28.3*  MCV 88.1 82.3 81.8 82.7  PLT 246 241 251 252   Basic Metabolic Panel:  Recent Labs Lab 01/05/16 0935 01/05/16 1310 01/06/16 0500 01/07/16 0450 01/08/16 0520  NA 138  --  139 141 138  K 4.9  --  3.6 3.4* 3.5  CL 109  --  102 103 103  CO2 20*  --  25 29 27   GLUCOSE 287*  --  151* 118* 119*  BUN 11  --  22* 28* 24*  CREATININE 1.40*  --  1.59* 1.50* 1.17  CALCIUM 8.2*  --  8.4* 8.4* 8.5*  MG  --  2.4 1.9 2.0  --   PHOS  --  6.7* 4.2 3.7  --    GFR: Estimated Creatinine Clearance: 57.6 mL/min (by C-G formula based on Cr of 1.17). Liver Function Tests:  Recent Labs Lab 01/05/16 0935  AST 43*  ALT 23  ALKPHOS 105  BILITOT 1.0  PROT 7.4  ALBUMIN 3.0*   No results for input(s): LIPASE, AMYLASE in  the last 168 hours. No results for input(s): AMMONIA in the last 168 hours. Coagulation Profile:  Recent Labs Lab 01/05/16 1350 01/05/16 2244 01/06/16 0500 01/07/16 1016 01/08/16 0520  INR 1.64* 1.79* 1.70* 1.66* 1.45  1.40   Cardiac Enzymes:  Recent Labs Lab 01/05/16 1310 01/05/16 1822 01/05/16 2230  TROPONINI 0.08* 0.08* 0.11*   BNP (last 3 results) No results for input(s): PROBNP in the last 8760 hours. HbA1C: No results for input(s): HGBA1C in the last 72 hours. CBG:  Recent Labs Lab 01/07/16 1606 01/07/16 2212 01/08/16 0812 01/08/16 1209 01/08/16 1737  GLUCAP 393* 215* 157* 213* 231*   Lipid Profile: No results for input(s): CHOL, HDL, LDLCALC, TRIG, CHOLHDL, LDLDIRECT in the last 72 hours. Thyroid Function Tests: No results for input(s): TSH, T4TOTAL, FREET4, T3FREE, THYROIDAB in the last 72 hours. Anemia Panel: No results for input(s): VITAMINB12, FOLATE, FERRITIN, TIBC, IRON, RETICCTPCT in the last 72 hours. Sepsis Labs:  Recent Labs Lab 01/05/16 0948 01/05/16 1310  PROCALCITON  --  0.20  LATICACIDVEN 5.55* 0.8    Recent Results (from the past 240 hour(s))  Culture,  blood (Routine X 2) w Reflex to ID Panel     Status: Abnormal   Collection Time: 01/05/16 10:19 AM  Result Value Ref Range Status   Specimen Description BLOOD LEFT NECK  Final   Special Requests BOTTLES DRAWN AEROBIC AND ANAEROBIC  5CC  Final   Culture  Setup Time   Final    GRAM POSITIVE COCCI IN CLUSTERS AEROBIC BOTTLE ONLY CRITICAL RESULT CALLED TO, READ BACK BY AND VERIFIED WITH: C. STEWART, PHARM D AT 1334 ON [id number] BY S. YARBROUGH    Culture (A)  Final    STAPHYLOCOCCUS SPECIES (COAGULASE NEGATIVE) THE SIGNIFICANCE OF ISOLATING THIS ORGANISM FROM A SINGLE SET OF BLOOD CULTURES WHEN MULTIPLE SETS ARE DRAWN IS UNCERTAIN. PLEASE NOTIFY THE MICROBIOLOGY DEPARTMENT WITHIN ONE WEEK IF SPECIATION AND SENSITIVITIES ARE REQUIRED.    Report Status 01/08/2016 FINAL  Final  Blood  Culture ID Panel (Reflexed)     Status: Abnormal   Collection Time: 01/05/16 10:19 AM  Result Value Ref Range Status   Enterococcus species NOT DETECTED NOT DETECTED Final   Vancomycin resistance NOT DETECTED NOT DETECTED Final   Listeria monocytogenes NOT DETECTED NOT DETECTED Final   Staphylococcus species DETECTED (A) NOT DETECTED Final    Comment: CRITICAL RESULT CALLED TO, READ BACK BY AND VERIFIED WITH: C. STEWART, PHARM D AT 1334 ON [id number] BY S. YARBROUGH    Staphylococcus aureus NOT DETECTED NOT DETECTED Final   Methicillin resistance DETECTED (A) NOT DETECTED Final    Comment: CRITICAL RESULT CALLED TO, READ BACK BY AND VERIFIED WITH: C. STEWART, PHARM D AT 1334 ON [id number] BY S. YARBROUGH    Streptococcus species NOT DETECTED NOT DETECTED Final   Streptococcus agalactiae NOT DETECTED NOT DETECTED Final   Streptococcus pneumoniae NOT DETECTED NOT DETECTED Final   Streptococcus pyogenes NOT DETECTED NOT DETECTED Final   Acinetobacter baumannii NOT DETECTED NOT DETECTED Final   Enterobacteriaceae species NOT DETECTED NOT DETECTED Final   Enterobacter cloacae complex NOT DETECTED NOT DETECTED Final   Escherichia coli NOT DETECTED NOT DETECTED Final   Klebsiella oxytoca NOT DETECTED NOT DETECTED Final   Klebsiella pneumoniae NOT DETECTED NOT DETECTED Final   Proteus species NOT DETECTED NOT DETECTED Final   Serratia marcescens NOT DETECTED NOT DETECTED Final   Carbapenem resistance NOT DETECTED NOT DETECTED Final   Haemophilus influenzae NOT DETECTED NOT DETECTED Final   Neisseria meningitidis NOT DETECTED NOT DETECTED Final   Pseudomonas aeruginosa NOT DETECTED NOT DETECTED Final   Candida albicans NOT DETECTED NOT DETECTED Final   Candida glabrata NOT DETECTED NOT DETECTED Final   Candida krusei NOT DETECTED NOT DETECTED Final   Candida parapsilosis NOT DETECTED NOT DETECTED Final   Candida tropicalis NOT DETECTED NOT DETECTED Final  Urine culture     Status: None    Collection Time: 01/05/16  1:20 PM  Result Value Ref Range Status   Specimen Description URINE, CATHETERIZED  Final   Special Requests NONE  Final   Culture NO GROWTH  Final   Report Status 01/06/2016 FINAL  Final  Culture, blood (Routine X 2) w Reflex to ID Panel     Status: None (Preliminary result)   Collection Time: 01/05/16  2:05 PM  Result Value Ref Range Status   Specimen Description BLOOD RIGHT ARM  Final   Special Requests BOTTLES DRAWN AEROBIC ONLY 8CC  Final   Culture NO GROWTH 3 DAYS  Final   Report Status PENDING  Incomplete  Radiology Studies: Dg Chest Port 1 View  01/07/2016  CLINICAL DATA:  Respiratory failure. EXAM: PORTABLE CHEST 1 VIEW COMPARISON:  01/06/2016. FINDINGS: Interim extubation removal of NG tube. Right IJ line stable position. Mediastinum and hilar structures normal. Cardiomegaly with normal pulmonary vascularity. No focal infiltrate. No pleural effusion or pneumothorax. IMPRESSION: 1. Interim extubation and removal of NG tube . Right IJ line stable position. 2. Cardiomegaly. No evidence of overt congestive heart failure. No focal pulmonary infiltrate. Electronically Signed   By: Maisie Fus  Register   On: 01/07/2016 07:16        Scheduled Meds: . aspirin  81 mg Oral Daily  . atorvastatin  80 mg Oral q1800  . clopidogrel  75 mg Oral Daily  . digoxin  0.125 mg Oral Daily  . furosemide  40 mg Oral Daily  . [START ON 01/09/2016] insulin aspart  0-15 Units Subcutaneous TID WC  . insulin aspart  0-5 Units Subcutaneous QHS  . ipratropium-albuterol  3 mL Nebulization Q6H  . nitroGLYCERIN  5-200 mcg/min Intravenous Once  . pantoprazole  40 mg Oral Q1200  . sacubitril-valsartan  1 tablet Oral BID  . sodium chloride flush  10-40 mL Intracatheter Q12H  . spironolactone  12.5 mg Oral Daily  . Warfarin - Pharmacist Dosing Inpatient   Does not apply q1800   Continuous Infusions:    LOS: 3 days    Time spent: 35 minutes    Kayti Poss,  MD [facility] Pager [phone]  If 7PM-7AM, please contact night-coverage [email or web address] Password [email or web address] 01/08/2016, 8:21 PM

## 2016-01-09 DIAGNOSIS — E876 Hypokalemia: Secondary | ICD-10-CM

## 2016-01-09 DIAGNOSIS — Z794 Long term (current) use of insulin: Secondary | ICD-10-CM

## 2016-01-09 DIAGNOSIS — E118 Type 2 diabetes mellitus with unspecified complications: Secondary | ICD-10-CM

## 2016-01-09 DIAGNOSIS — K219 Gastro-esophageal reflux disease without esophagitis: Secondary | ICD-10-CM

## 2016-01-09 DIAGNOSIS — R7989 Other specified abnormal findings of blood chemistry: Secondary | ICD-10-CM

## 2016-01-09 DIAGNOSIS — R778 Other specified abnormalities of plasma proteins: Secondary | ICD-10-CM | POA: Insufficient documentation

## 2016-01-09 DIAGNOSIS — I1310 Hypertensive heart and chronic kidney disease without heart failure, with stage 1 through stage 4 chronic kidney disease, or unspecified chronic kidney disease: Secondary | ICD-10-CM | POA: Insufficient documentation

## 2016-01-09 DIAGNOSIS — I130 Hypertensive heart and chronic kidney disease with heart failure and stage 1 through stage 4 chronic kidney disease, or unspecified chronic kidney disease: Principal | ICD-10-CM

## 2016-01-09 DIAGNOSIS — I10 Essential (primary) hypertension: Secondary | ICD-10-CM

## 2016-01-09 LAB — CBC
HEMATOCRIT: 29.4 % — AB (ref 39.0–52.0)
Hemoglobin: 8.7 g/dL — ABNORMAL LOW (ref 13.0–17.0)
MCH: 24.3 pg — ABNORMAL LOW (ref 26.0–34.0)
MCHC: 29.6 g/dL — AB (ref 30.0–36.0)
MCV: 82.1 fL (ref 78.0–100.0)
PLATELETS: 274 10*3/uL (ref 150–400)
RBC: 3.58 MIL/uL — ABNORMAL LOW (ref 4.22–5.81)
RDW: 14.3 % (ref 11.5–15.5)
WBC: 6.7 10*3/uL (ref 4.0–10.5)

## 2016-01-09 LAB — BASIC METABOLIC PANEL
ANION GAP: 6 (ref 5–15)
BUN: 22 mg/dL — ABNORMAL HIGH (ref 6–20)
CALCIUM: 8.5 mg/dL — AB (ref 8.9–10.3)
CHLORIDE: 102 mmol/L (ref 101–111)
CO2: 29 mmol/L (ref 22–32)
Creatinine, Ser: 1.16 mg/dL (ref 0.61–1.24)
GFR calc non Af Amer: >60 mL/min (ref >60)
Glucose, Bld: 171 mg/dL — ABNORMAL HIGH (ref 65–99)
POTASSIUM: 4 mmol/L (ref 3.5–5.1)
Sodium: 137 mmol/L (ref 135–145)

## 2016-01-09 LAB — GLUCOSE, CAPILLARY
GLUCOSE-CAPILLARY: 178 mg/dL — AB (ref 65–99)
GLUCOSE-CAPILLARY: 299 mg/dL — AB (ref 65–99)

## 2016-01-09 LAB — PROTIME-INR
INR: 1.44 (ref 0.00–1.49)
Prothrombin Time: 17.7 seconds — ABNORMAL HIGH (ref 11.6–15.2)

## 2016-01-09 MED ORDER — PANTOPRAZOLE SODIUM 40 MG PO TBEC: 40.0000 mg | DELAYED_RELEASE_TABLET | Freq: Every day | ORAL | Status: DC

## 2016-01-09 MED ORDER — WARFARIN SODIUM 7.5 MG PO TABS
7.5000 mg | ORAL_TABLET | Freq: Once | ORAL | Status: AC
  Administered 2016-01-09: 7.5 mg via ORAL
  Filled 2016-01-09: qty 1

## 2016-01-09 MED ORDER — CARVEDILOL 3.125 MG PO TABS: 3.1250 mg | ORAL_TABLET | Freq: Two times a day (BID) | ORAL | Status: DC

## 2016-01-09 MED ORDER — DIGOXIN 125 MCG PO TABS: 0.1250 mg | ORAL_TABLET | Freq: Every day | ORAL | Status: DC

## 2016-01-09 MED ORDER — SPIRONOLACTONE 25 MG PO TABS: 25.0000 mg | ORAL_TABLET | Freq: Every day | ORAL | Status: DC

## 2016-01-09 MED ORDER — SACUBITRIL-VALSARTAN 49-51 MG PO TABS: 1.0000 | ORAL_TABLET | Freq: Two times a day (BID) | ORAL | Status: DC

## 2016-01-09 MED ORDER — FUROSEMIDE 40 MG PO TABS: 40.0000 mg | ORAL_TABLET | Freq: Every day | ORAL | Status: DC

## 2016-01-09 NOTE — Progress Notes (Signed)
CM spoke with MD regarding  ENTRESTO for pt @ d/c. Benefits check  for Entresto 49/51 bid, copay and prior authorization if needed placed per CM. CM provided pt with a 30 day free card for a 1 month supply for Entresto and a $ 10.00 copay card for refills given 2/2 pt having commercial insurance. Both cards explained to pt per CM , with pt verbally stating understanding of usage for both cards. [facility]) [phone] called per CM and confirmed medication is in stock. CM made pt aware. CM to f/u with pt on results of benefits check  tomorrow, insurance offices closed 2/2 weekend. Gae Gallop RN,BSN,CM [phone]

## 2016-01-09 NOTE — Discharge Summary (Signed)
Physician Discharge Summary  Eddie [name] ACZ:[id number] DOB: Dec 03, 1950 DOA: 01/05/2016  PCP: Philis Fendt, MD  Admit date: 01/05/2016 Discharge date: 01/09/2016  Time spent: 35 minutes  Recommendations for Outpatient Follow-up:  Repeat BMET to follow electrolytes and renal function   Discharge Diagnoses:  Principal Problem:   Acute respiratory failure ([place]) Active Problems:   Hypokalemia   Stroke ([place])   Type 2 diabetes mellitus with complication ([place])   Acute on chronic combined systolic and diastolic heart failure (HCC)   Acute systolic heart failure (HCC)   HTN (hypertension), malignant   Cerebrovascular accident (CVA) due to embolism of cerebral artery ([place])   Discharge Condition: stable and improved. Discharge home with instructions to follow with HF clinic (most likely in the next 10 days; appointment to be set up by HF clinic). Will also follow up with PCP in 2 weeks  Diet recommendation: heart healthy diet and low carbohydrates   Filed Weights   01/07/16 1509 01/08/16 0545 01/09/16 0605  Weight: 67.858 kg (149 lb 9.6 oz) 67.2 kg (148 lb 2.4 oz) 68.2 kg (150 lb 5.7 oz)    History of present illness:  65yo male with hx CAD, CVA, multiple recent admissions for acute respiratory failure (5/20-5/24 and 5/29-6/2) in the setting of decompensated CHF and EColi bacteremia. He returned 6/14 after calling EMS with c/o SOB. On EMS arrival he was awake and able to ambulate to their stretcher then became unresponsive and intubated with [facility] airway by EMS. Reintubated with ETT on arrival to ER. In ER had significant hypertension, ongoing AMS and mild AKI. Patient initially admitted to the critical care team. Cardiology consulted. Once improved and extubated transfer to IM for further management and assistance with care.  Hospital Course:  #1 acute respiratory failure/vent dependent respiratory failure secondary to acute on chronic systolic heart failure Patient status post  vent-dependent respiratory failure secondary to acute on chronic systolic heart failure likely secondary to poor medication noncompliance and dietary indiscretion. Patient initially on the critical care team subsequently extubated with clinical improvement and transferred to the floor/IM service. Patient denies any chest pain or shortness of breath.  Patient was approx -7L during this hospitalization.  Patient regimen adjusted and will be follow by cardiology at HF clinic Good O2 sat on RA Will be discharge on Coreg 3.125 mg BID, Lasix 37m daily, 0.1272mdigoxin, entresto 49/5137mAldactone 60m67mD.  Advise to follow low sodium diet and to check weight on daily basis  #2 malignant hypertension Patient was noted on admission to have a hypertensive crisis.  Blood pressure improved/controlled at discharge.  Continue Lasix, Aldactone, Entresto and coreg.   #3 history of CVA Presumed to be cardioembolic.  Continue Coumadin and Plavix.  Per cardiology no need for heparin bridge and/or further aspirin.  Continue Risk factor modification.   #4 hypokalemia Repleted and WNL at discharge   #5 1/2 blood cultures MRSE, probable contaminant Patient does not look septic at all. Patient is afebrile, with normal white count. Patient initially was empirically treated for healthcare associated pneumonia however infiltrates resolved quickly and a such antibiotics discontinued. Patient's case curbside with ID who felt most likely contaminant and recommended no further antibiotics at this point.  #6 acute encephalopathy Likely secondary to problem #1.  Resolved.  #7 diabetes mellitus Stable CBG's Will discharge on home lantus and metformin regimen   #8 acute kidney injury on chronic kidney disease stage II Stable and at baseline at discharge.  Recommend close follow up  of his electrolyte sand renal function.  #9 deconditioning  -HHPT has been ordered   Procedures:  CT head 01/05/2016  Chest  x-ray 01/05/2016, 01/06/2016, 01/07/2016  ETT 6/14 >>>6/15  Consultations:  Cardiology  PCCM  Discharge Exam: Filed Vitals:   01/09/16 0605 01/09/16 1420  BP: 140/89 137/95  Pulse: 94 106  Temp: 98.1 F (36.7 C) 98 F (36.7 C)  Resp: 18 20   General exam: Appears calm and comfortable; no CP or SOB. Good O2 sat on RA. No fluid overload on exam. Respiratory system: Clear to auscultation. Respiratory effort normal. Cardiovascular system: S1 & S2 heard, RRR. No JVD, murmurs, rubs, gallops or clicks.  Gastrointestinal system: Abdomen is nondistended, soft and nontender. No organomegaly or masses felt. Normal bowel sounds heard. Central nervous system: Alert and oriented X3 . No focal neurological deficits. Extremities: No cyanosis or clubbing; MS 4/5 bilaterally secondary to poor effort  Skin: No rashes, lesions or ulcers Psychiatry: Judgement and insight appear normal. Mood & affect appropriate.    Discharge Instructions   Discharge Instructions    Diet - low sodium heart healthy    Complete by:  As directed      Discharge instructions    Complete by:  As directed   Follow daily weight (contact heart failure if you gain > 3 pounds overnight and/or > 5 pounds in a week) Follow low sodium diet (less than 1.8-2.0 gram daily) Take medications as prescribed Follow up as instructed at HF clinic  Maintain adequate hydration  Watch amount of carbohydrates in your diet          Current Discharge Medication List    START taking these medications   Details  digoxin (LANOXIN) 0.125 MG tablet Take 1 tablet (0.125 mg total) by mouth daily. Qty: 30 tablet, Refills: 1    pantoprazole (PROTONIX) 40 MG tablet Take 1 tablet (40 mg total) by mouth daily at 12 noon. Qty: 30 tablet, Refills: 1    sacubitril-valsartan (ENTRESTO) 49-51 MG Take 1 tablet by mouth 2 (two) times daily. Qty: 60 tablet, Refills: 1      CONTINUE these medications which have CHANGED   Details  carvedilol  (COREG) 3.125 MG tablet Take 1 tablet (3.125 mg total) by mouth 2 (two) times daily with a meal. Qty: 60 tablet, Refills: 1   Associated Diagnoses: ST elevation myocardial infarction involving left anterior descending (LAD) coronary artery ([place]); Essential hypertension    furosemide (LASIX) 40 MG tablet Take 1 tablet (40 mg total) by mouth daily. Qty: 30 tablet, Refills: 1    spironolactone (ALDACTONE) 25 MG tablet Take 1 tablet (25 mg total) by mouth daily. Qty: 30 tablet, Refills: 1      CONTINUE these medications which have NOT CHANGED   Details  atorvastatin (LIPITOR) 80 MG tablet Take 1 tablet (80 mg total) by mouth daily at 6 PM. Qty: 30 tablet, Refills: 5   Associated Diagnoses: ST elevation myocardial infarction involving left anterior descending (LAD) coronary artery (HCC)    blood glucose meter kit and supplies KIT Dispense based on patient and insurance preference. Use up to four times daily as directed. (FOR ICD-9 250.00, 250.01). Qty: 1 each, Refills: 0    clopidogrel (PLAVIX) 75 MG tablet Take 4 tablets today then take 1 tablet by mouth daily after that Qty: 94 tablet, Refills: 3    Insulin Glargine (LANTUS) 100 UNIT/ML Solostar Pen Inject 10 Units into the skin daily at 10 pm. Qty: 15 mL, Refills: 0  Insulin Pen Needle 31G X 5 MM MISC 1 each by Does not apply route 3 (three) times daily before meals. Qty: 90 each, Refills: 0    metFORMIN (GLUCOPHAGE) 850 MG tablet Take 1 tablet (850 mg total) by mouth daily with breakfast. Qty: 30 tablet, Refills: 0    warfarin (COUMADIN) 5 MG tablet Take 1 tablet daily or as directed by coumadin clinic Qty: 90 tablet, Refills: 3    ipratropium-albuterol (DUONEB) 0.5-2.5 (3) MG/3ML SOLN Take 3 mLs by nebulization every 2 (two) hours as needed. Qty: 360 mL, Refills: 5    nitroGLYCERIN (NITROSTAT) 0.4 MG SL tablet Use one pill every 5 minutes X 3 if needed for chest pain. Contact MD if pain does not improve Qty: 30 tablet,  Refills: 0      STOP taking these medications     aspirin 81 MG chewable tablet      lisinopril (PRINIVIL,ZESTRIL) 5 MG tablet        No Known Allergies Follow-up Information    Call Glori Bickers, MD.   Specialty:  Cardiology   Why:  office for appointment details   Contact information:   [address] [place] [address] [phone]       The results of significant diagnostics from this hospitalization (including imaging, microbiology, ancillary and laboratory) are listed below for reference.    Significant Diagnostic Studies: Ct Head Wo Contrast  01/05/2016  CLINICAL DATA:  Altered level of consciousness. History of prior CVA EXAM: CT HEAD WITHOUT CONTRAST TECHNIQUE: Contiguous axial images were obtained from the base of the skull through the vertex without intravenous contrast. COMPARISON:  Dec 11, 2015 FINDINGS: The ventricles are normal in size and configuration. There is no intracranial mass, hemorrhage, extra-axial fluid collection, or midline shift. There is a prior small lacunar infarct in the right corona radiata, stable. A tiny lacunar infarct in the anterior left corona radiata is stable as well. There is no new gray-white compartment lesion. No acute infarct evident. The bony calvarium appears intact. The mastoid air cells are clear. Visualized orbits appear symmetric bilaterally. A small calcification in the anterior aspect of the right globe is stable. IMPRESSION: There is a small lacunar infarct in each corona radiata, stable. No new gray-white compartment lesion. No acute infarct. No hemorrhage or mass. Electronically Signed   By: Lowella Grip III M.D.   On: 01/05/2016 11:10   Ct Head Wo Contrast  12/11/2015  CLINICAL DATA:  Decreased mental status. History of stroke in March. Concern for sepsis due to bladder infection. EXAM: CT HEAD WITHOUT CONTRAST TECHNIQUE: Contiguous axial images were obtained from the base of the skull through the  vertex without intravenous contrast. COMPARISON:  None. FINDINGS: Brain: Geographic area of decreased attenuation involving the right centrum semiovale (is 21, series 201), the sequela of microvascular ischemic disease. Old infarct within the right basal ganglia (image 15, series 201). The gray-white differentiation is otherwise well maintained without CT evidence of acute large territory infarct. No intraparenchymal or extra-axial mass or hemorrhage. Normal size and configuration of the ventricles and basilar cisterns. No midline shift. Vascular: Minimal intracranial atherosclerosis Skull: Negative for fracture or focal lesion. Sinuses/Orbits: Post right-sided cataract surgery. There is underpneumatization the bilateral frontal sinuses. The remaining paranasal sinuses and mastoid air cells are normally aerated. No air-fluid levels. Other: The patient is intubated. Regional soft tissues appear normal. 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 Electronically Signed   By: Eldridge Abrahams.D.  On: 12/11/2015 08:27   Dg Chest Port 1 View  01/07/2016  CLINICAL DATA:  Respiratory failure. EXAM: PORTABLE CHEST 1 VIEW COMPARISON:  01/06/2016. FINDINGS: Interim extubation removal of NG tube. Right IJ line stable position. Mediastinum and hilar structures normal. Cardiomegaly with normal pulmonary vascularity. No focal infiltrate. No pleural effusion or pneumothorax. IMPRESSION: 1. Interim extubation and removal of NG tube . Right IJ line stable position. 2. Cardiomegaly. No evidence of overt congestive heart failure. No focal pulmonary infiltrate. Electronically Signed   By: Marcello Moores  Register   On: 01/07/2016 07:16   Dg Chest Port 1 View  01/06/2016  CLINICAL DATA:  Pulmonary edema . EXAM: PORTABLE CHEST 1 VIEW COMPARISON:  01/05/2016. FINDINGS: Endotracheal tube, NG tube, right IJ line in stable position. Heart size normal. Interim near complete clearing of bilateral pulmonary  infiltrates. No pleural effusion or pneumothorax. IMPRESSION: 1.  Lines and tubes in stable position. 2. Interim near complete clearing of bilateral pulmonary infiltrates and/or edema. Electronically Signed   By: Marcello Moores  Register   On: 01/06/2016 07:34   Dg Chest Portable 1 View  01/05/2016  CLINICAL DATA:  Central line placement EXAM: PORTABLE CHEST 1 VIEW COMPARISON:  Earlier today FINDINGS: Endotracheal and orogastric tubes remains in unchanged position where seen. New right IJ central line with tip at the SVC level. No pneumothorax or new mediastinal widening. Symmetric perihilar interstitial and airspace opacities consistent with edema. Stable cardiopericardial size. Stable aortic contours. IMPRESSION: 1. New central line without complicating feature. 2. Unchanged edema. Electronically Signed   By: Monte Fantasia M.D.   On: 01/05/2016 12:34   Dg Chest Portable 1 View  01/05/2016  CLINICAL DATA:  Hypoxia EXAM: PORTABLE CHEST 1 VIEW COMPARISON:  Dec 22, 2015 FINDINGS: Endotracheal tube tip is 5.3 cm above the carina. Nasogastric tube tip and side port are in this distal stomach. No pneumothorax. There is widespread alveolar edema, present diffusely but most pronounced in a perihilar distribution. There is also focal airspace consolidation, asymmetric, in the right base compared to the left base. There is mild cardiomegaly with pulmonary venous hypertension. No adenopathy evident. IMPRESSION: Tube positions as described without pneumothorax. Findings indicative of congestive heart failure. Superimposed pneumonia, particularly in the right base, cannot be excluded. Both congestive heart failure and pneumonia may well be present at this time. Electronically Signed   By: Lowella Grip III M.D.   On: 01/05/2016 10:01   Dg Chest Port 1 View  12/22/2015  CLINICAL DATA:  Pulmonary edema, hypertension, intubated patient. EXAM: PORTABLE CHEST 1 VIEW COMPARISON:  Portable chest x-ray of Dec 21, 2015 FINDINGS:  The lungs are adequately inflated and clear. The heart is mildly enlarged but stable. The pulmonary vascularity is not engorged. The endotracheal tube tip lies 4.2 cm above the carina. The esophagogastric tube tip projects below the inferior margin of the image. The bony thorax exhibits no acute abnormality. IMPRESSION: Interval resolution of pulmonary interstitial edema. Stable mild cardiomegaly. The support tubes are in stable position. Electronically Signed   By: David  Martinique M.D.   On: 12/22/2015 07:38   Dg Chest Port 1 View  12/21/2015  CLINICAL DATA:  Respiratory failure. EXAM: PORTABLE CHEST 1 VIEW COMPARISON:  12/20/2015. FINDINGS: Endotracheal tube in stable position. Interim placement of NG tube, its tip is below the left hemidiaphragm. Stable cardiomegaly. Diffuse bilateral airspace disease with slight interim improvement. No pleural effusion or pneumothorax. IMPRESSION: 1. Interim placement NG tube, its tip is below the left hemidiaphragm. Endotracheal tube in  stable position. 2. Diffuse bilateral airspace disease with slight interim improvement. Stable cardiomegaly. Electronically Signed   By: [place]   On: 12/21/2015 06:54   Dg Chest Port 1 View  12/20/2015  CLINICAL DATA:  Endotracheal tube placement, for respiratory distress. Initial encounter. EXAM: PORTABLE CHEST 1 VIEW COMPARISON:  Chest radiograph performed 12/12/2015 FINDINGS: The patient's endotracheal tube is seen ending 4-5 cm above the carina. Fluffy bilateral central airspace opacification may reflect multifocal pneumonia or pulmonary edema. This is new from the prior study. No pleural effusion or pneumothorax is seen. The cardiomediastinal silhouette is borderline enlarged. No acute osseous abnormalities are identified. IMPRESSION: 1. Endotracheal tube seen ending 4-5 cm above the carina. 2. Fluffy bilateral central airspace opacification may reflect multifocal pneumonia or pulmonary edema. This is new from the prior study.  3. Borderline cardiomegaly. Electronically Signed   By: Garald Balding M.D.   On: 12/20/2015 18:33   Dg Chest Port 1 View  12/12/2015  CLINICAL DATA:  Ventilator dependent respiratory failure. STEMI and stroke in March, 2017. Patient admitted yesterday for acute on chronic shortness of breath and fever. EXAM: PORTABLE CHEST 1 VIEW COMPARISON:  12/11/2015. FINDINGS: Endotracheal tube tip in satisfactory position projecting approximately 5 cm above the carina. Nasogastric tube courses below the diaphragm into the stomach. Cardiac silhouette upper normal in size to slightly enlarged for AP portable technique. Lungs clear. Bronchovascular markings normal. Pulmonary vascularity normal. No visible pleural effusions. No pneumothorax. Barium from the swallowing function study performed 5 days ago still present in the visualized splenic flexure the colon. IMPRESSION: 1. Support apparatus satisfactory. 2.  No acute cardiopulmonary disease. Electronically Signed   By: Evangeline Dakin M.D.   On: 12/12/2015 10:19   Dg Chest Port 1 View  12/11/2015  CLINICAL DATA:  Respiratory failure. History of coronary artery disease. EXAM: PORTABLE CHEST 1 VIEW COMPARISON:  12/11/2015 at 5:25 a.m. FINDINGS: Endotracheal tube is well positioned measuring 3 cm above the carina. Nasal/orogastric tube is also well positioned passing below the diaphragm into the stomach. Cardiac silhouette is borderline enlarged. No mediastinal or hilar masses or evidence of adenopathy. There is bilateral interstitial thickening with central hazy airspace opacity. Findings suggest pulmonary edema. No convincing pleural effusion. No pneumothorax on this semi-erect study. IMPRESSION: 1. Findings similar to the earlier study. 2. Interstitial thickening with mild hazy central airspace lung opacity is most likely due to pulmonary edema/congestive heart failure. 3. Endotracheal and nasogastric tubes are stable and well positioned. Electronically Signed   By:  Lajean Manes M.D.   On: 12/11/2015 08:11   Dg Chest Portable 1 View  12/11/2015  CLINICAL DATA:  Endotracheal tube placement EXAM: PORTABLE CHEST 1 VIEW COMPARISON:  None. FINDINGS: Endotracheal tube present with tip measuring 3.7 cm above the carina. Enteric tube is present with tip off the field of view but below the left hemidiaphragm. Proximal side hole is positioned just below the expected location of the EG junction. Heart size is normal. There are bilateral perihilar infiltrates suggesting edema or pneumonia. No blunting of costophrenic angles. No pneumothorax. IMPRESSION: Appliances appear in satisfactory location. Bilateral perihilar infiltrates suggesting edema or pneumonia. Electronically Signed   By: Lucienne Capers M.D.   On: 12/11/2015 06:16   Dg Abd Portable 1v  12/21/2015  CLINICAL DATA:  Nasogastric tube placement EXAM: PORTABLE ABDOMEN - 1 VIEW COMPARISON:  February 03, 2008 FINDINGS: Nasogastric tube tip and side port are in the stomach. There is moderate stool throughout the colon. There is  no bowel dilatation or air-fluid level suggesting obstruction. No free air. Rectal thermometer is evident. There is extensive prostatic calcification. Lung bases are clear. IMPRESSION: Nasogastric tube tip and side port in stomach. Bowel gas pattern unremarkable. No demonstrable free air or bowel obstruction. Electronically Signed   By: Lowella Grip III M.D.   On: 12/21/2015 16:51    Microbiology: Recent Results (from the past 240 hour(s))  Culture, blood (Routine X 2) w Reflex to ID Panel     Status: Abnormal   Collection Time: 01/05/16 10:19 AM  Result Value Ref Range Status   Specimen Description BLOOD LEFT NECK  Final   Special Requests BOTTLES DRAWN AEROBIC AND ANAEROBIC  5CC  Final   Culture  Setup Time   Final    GRAM POSITIVE COCCI IN CLUSTERS AEROBIC BOTTLE ONLY CRITICAL RESULT CALLED TO, READ BACK BY AND VERIFIED WITH: C. STEWART, PHARM D AT 1334 ON [id number] BY S. YARBROUGH     Culture (A)  Final    STAPHYLOCOCCUS SPECIES (COAGULASE NEGATIVE) THE SIGNIFICANCE OF ISOLATING THIS ORGANISM FROM A SINGLE SET OF BLOOD CULTURES WHEN MULTIPLE SETS ARE DRAWN IS UNCERTAIN. PLEASE NOTIFY THE MICROBIOLOGY DEPARTMENT WITHIN ONE WEEK IF SPECIATION AND SENSITIVITIES ARE REQUIRED.    Report Status 01/08/2016 FINAL  Final  Blood Culture ID Panel (Reflexed)     Status: Abnormal   Collection Time: 01/05/16 10:19 AM  Result Value Ref Range Status   Enterococcus species NOT DETECTED NOT DETECTED Final   Vancomycin resistance NOT DETECTED NOT DETECTED Final   Listeria monocytogenes NOT DETECTED NOT DETECTED Final   Staphylococcus species DETECTED (A) NOT DETECTED Final    Comment: CRITICAL RESULT CALLED TO, READ BACK BY AND VERIFIED WITH: C. STEWART, PHARM D AT 1334 ON [id number] BY S. YARBROUGH    Staphylococcus aureus NOT DETECTED NOT DETECTED Final   Methicillin resistance DETECTED (A) NOT DETECTED Final    Comment: CRITICAL RESULT CALLED TO, READ BACK BY AND VERIFIED WITH: C. STEWART, PHARM D AT 1334 ON [id number] BY S. YARBROUGH    Streptococcus species NOT DETECTED NOT DETECTED Final   Streptococcus agalactiae NOT DETECTED NOT DETECTED Final   Streptococcus pneumoniae NOT DETECTED NOT DETECTED Final   Streptococcus pyogenes NOT DETECTED NOT DETECTED Final   Acinetobacter baumannii NOT DETECTED NOT DETECTED Final   Enterobacteriaceae species NOT DETECTED NOT DETECTED Final   Enterobacter cloacae complex NOT DETECTED NOT DETECTED Final   Escherichia coli NOT DETECTED NOT DETECTED Final   Klebsiella oxytoca NOT DETECTED NOT DETECTED Final   Klebsiella pneumoniae NOT DETECTED NOT DETECTED Final   Proteus species NOT DETECTED NOT DETECTED Final   Serratia marcescens NOT DETECTED NOT DETECTED Final   Carbapenem resistance NOT DETECTED NOT DETECTED Final   Haemophilus influenzae NOT DETECTED NOT DETECTED Final   Neisseria meningitidis NOT DETECTED NOT DETECTED Final   Pseudomonas  aeruginosa NOT DETECTED NOT DETECTED Final   Candida albicans NOT DETECTED NOT DETECTED Final   Candida glabrata NOT DETECTED NOT DETECTED Final   Candida krusei NOT DETECTED NOT DETECTED Final   Candida parapsilosis NOT DETECTED NOT DETECTED Final   Candida tropicalis NOT DETECTED NOT DETECTED Final  Urine culture     Status: None   Collection Time: 01/05/16  1:20 PM  Result Value Ref Range Status   Specimen Description URINE, CATHETERIZED  Final   Special Requests NONE  Final   Culture NO GROWTH  Final   Report Status 01/06/2016 FINAL  Final  Culture, blood (Routine  X 2) w Reflex to ID Panel     Status: None (Preliminary result)   Collection Time: 01/05/16  2:05 PM  Result Value Ref Range Status   Specimen Description BLOOD RIGHT ARM  Final   Special Requests BOTTLES DRAWN AEROBIC ONLY 8CC  Final   Culture NO GROWTH 3 DAYS  Final   Report Status PENDING  Incomplete     Labs: Basic Metabolic Panel:  Recent Labs Lab 01/05/16 0935 01/05/16 1310 01/06/16 0500 01/07/16 0450 01/08/16 0520 01/09/16 0445  NA 138  --  139 141 138 137  K 4.9  --  3.6 3.4* 3.5 4.0  CL 109  --  102 103 103 102  CO2 20*  --  _0 GLUCOSE 287*  --  151* 118* 119* 171*  BUN 11  --  22* 28* 24* 22*  CREATININE 1.40*  --  1.59* 1.50* 1.17 1.16  CALCIUM 8.2*  --  8.4* 8.4* 8.5* 8.5*  MG  --  2.4 1.9 2.0  --   --   PHOS  --  6.7* 4.2 3.7  --   --    Liver Function Tests:  Recent Labs Lab 01/05/16 0935  AST 43*  ALT 23  ALKPHOS 105  BILITOT 1.0  PROT 7.4  ALBUMIN 3.0*   CBC:  Recent Labs Lab 01/05/16 1024 01/06/16 0500 01/07/16 0450 01/08/16 0520 01/09/16 0445  WBC 7.5 10.8* 15.4* 8.9 6.7  HGB 8.9* 8.2* 7.9* 8.4* 8.7*  HCT 31.0* 27.5* 26.6* 28.3* 29.4*  MCV 88.1 82.3 81.8 82.7 82.1  PLT 246 241 251 252 274   Cardiac Enzymes:  Recent Labs Lab 01/05/16 1310 01/05/16 1822 01/05/16 2230  TROPONINI 0.08* 0.08* 0.11*   BNP: BNP (last 3 results)  Recent Labs   12/11/15 0720 12/20/15 1928 01/05/16 1310  BNP 492.7* 1475.7* 1148.9*    CBG:  Recent Labs Lab 01/08/16 1209 01/08/16 1737 01/08/16 2129 01/09/16 0813 01/09/16 1202  GLUCAP 213* 231* 282* 178* 299*    Signed:  Barton Dubois MD.  [facility] 01/09/2016, 4:09 PM

## 2016-01-09 NOTE — Progress Notes (Signed)
ANTICOAGULATION CONSULT NOTE - Follow Up Consult  Pharmacy Consult for Warfarin Indication: stroke  No Known Allergies  Patient Measurements: Height: 5\' 6"  (167.6 cm) Weight: 150 lb 5.7 oz (68.2 kg) IBW/kg (Calculated) : 63.8  Vital Signs: Temp: 98.1 F (36.7 C) (06/18 0605) BP: 140/89 mmHg (06/18 0605) Pulse Rate: 94 (06/18 0605)  Labs:  Recent Labs  01/07/16 0450 01/07/16 1016 01/07/16 1244 01/07/16 2224 01/08/16 0520 01/09/16 0445  HGB 7.9*  --   --   --  8.4* 8.7*  HCT 26.6*  --   --   --  28.3* 29.4*  PLT 251  --   --   --  252 274  LABPROT  --  19.6*  --   --  17.7*  17.2* 17.7*  INR  --  1.66*  --   --  1.45  1.40 1.44  HEPARINUNFRC 0.10*  --  0.20* 0.27* 0.26*  0.32  --   CREATININE 1.50*  --   --   --  1.17 1.16    Estimated Creatinine Clearance: 58.1 mL/min (by C-G formula based on Cr of 1.16).  Assessment: 9 YOM admitted 01/05/2016 with respiratory failure on warfarin PTA for hx CVA. Pharmacy originally consulted to bridge with heparin however this has been discontinued this AM by the HF team since there is no evidence of Afib or LV thrombus on work-up that would require a heparin bridge.  INR today remains SUBtherapeutic (INR 1.44 << 1.45, goal of 2-3). CBC stable - no overt s/sx of bleeding noted.  PTA dose noted to be 5 mg daily EXCEPT for 7.5 mg on Mon/Fri  Noted plans for discharge today - upon discharge would recommend 7.5 mg this evening then to resume the patient's PTA dose of 5 mg daily EXCEPT for 7.5 mg on Mon/Fri. Would recommend an INR check early this week (Tues/Wed) to follow-up dosing.   Goal of Therapy:  INR 2-3   Plan:  1. Warfarin 7.5 mg x 1 dose at 1800 today 2. Will continue to monitor for any signs/symptoms of bleeding and will follow up with PT/INR in the a.m.   Georgina Pillion, PharmD, BCPS Clinical Pharmacist Pager: [phone] 01/09/2016 12:35 PM

## 2016-01-09 NOTE — Progress Notes (Signed)
CM spoke with pt regarding insurance premium, 2 months behind per [facility] with an allowed grace period to catchup by July per [facility]/Tiffany. Tiffany informed CM if pt doesn't catch up with insurance premiums by the end of July, and If home health services are provided once d/c pt will be bill for $150.00/ visit for [facility]. Information shared with pt by CM and the pt  stated he understands and there shouldn't be any problems because he and wife are paying premiums this week and he would like [facility] services provided @ d/c. CM made Tiffany/[facility] aware. Gae Gallop RN,BSN,CM [phone]

## 2016-01-09 NOTE — Progress Notes (Signed)
Advanced Heart Failure Rounding Note   Subjective:    Volume status stable. BP improving.  Wants to go home   Objective:   Weight Range:  Vital Signs:   Temp:  [98.1 F (36.7 C)-98.5 F (36.9 C)] 98.1 F (36.7 C) (06/18 0605) Pulse Rate:  [47-94] 94 (06/18 0605) Resp:  [18-20] 18 (06/18 0605) BP: (119-168)/(77-89) 140/89 mmHg (06/18 0605) SpO2:  [97 %-100 %] 100 % (06/18 0605) Weight:  [68.2 kg (150 lb 5.7 oz)] 68.2 kg (150 lb 5.7 oz) (06/18 0605) Last BM Date: 01/02/16  Weight change: Filed Weights   01/07/16 1509 01/08/16 0545 01/09/16 0605  Weight: 67.858 kg (149 lb 9.6 oz) 67.2 kg (148 lb 2.4 oz) 68.2 kg (150 lb 5.7 oz)    Intake/Output:   Intake/Output Summary (Last 24 hours) at 01/09/16 0806 Last data filed at 01/09/16 0645  Gross per 24 hour  Intake    510 ml  Output   1900 ml  Net  -1390 ml     Physical Exam: General:  Sitting up in bed NAD HEENT: normal Neck: supple. JVP 6 . Carotids 2+ bilat; no bruits. No lymphadenopathy or thryomegaly appreciated. Cor: PMI laterally displaced. Regular rate & rhythm. No rubs, gallops or murmurs. Lungs: clear Abdomen: soft, nontender, nondistended. No hepatosplenomegaly. No bruits or masses. Good bowel sounds. Extremities: no cyanosis, clubbing, rash, no  edema Neuro: alert & orientedx3, cranial nerves grossly intact. moves all 4 extremities w/o difficulty. Affect pleasant  Telemetry: SR/S tach   Labs: Basic Metabolic Panel:  Recent Labs Lab 01/05/16 0935 01/05/16 1310 01/06/16 0500 01/07/16 0450 01/08/16 0520 01/09/16 0445  NA 138  --  139 141 138 137  K 4.9  --  3.6 3.4* 3.5 4.0  CL 109  --  102 103 103 102  CO2 20*  --  GLUCOSE 287*  --  151* 118* 119* 171*  BUN 11  --  22* 28* 24* 22*  CREATININE 1.40*  --  1.59* 1.50* 1.17 1.16  CALCIUM 8.2*  --  8.4* 8.4* 8.5* 8.5*  MG  --  2.4 1.9 2.0  --   --   PHOS  --  6.7* 4.2 3.7  --   --     Liver Function Tests:  Recent Labs Lab  01/05/16 0935  AST 43*  ALT 23  ALKPHOS 105  BILITOT 1.0  PROT 7.4  ALBUMIN 3.0*   No results for input(s): LIPASE, AMYLASE in the last 168 hours. No results for input(s): AMMONIA in the last 168 hours.  CBC:  Recent Labs Lab 01/05/16 1024 01/06/16 0500 01/07/16 0450 01/08/16 0520 01/09/16 0445  WBC 7.5 10.8* 15.4* 8.9 6.7  HGB 8.9* 8.2* 7.9* 8.4* 8.7*  HCT 31.0* 27.5* 26.6* 28.3* 29.4*  MCV 88.1 82.3 81.8 82.7 82.1  PLT 246 241 251 252 274    Cardiac Enzymes:  Recent Labs Lab 01/05/16 1310 01/05/16 1822 01/05/16 2230  TROPONINI 0.08* 0.08* 0.11*    BNP: BNP (last 3 results)  Recent Labs  12/11/15 0720 12/20/15 1928 01/05/16 1310  BNP 492.7* 1475.7* 1148.9*    ProBNP (last 3 results) No results for input(s): PROBNP in the last 8760 hours.    Other results:  Imaging: No results found.    Medications:     Scheduled Medications: . aspirin  81 mg Oral Daily  . atorvastatin  80 mg Oral q1800  . clopidogrel  75 mg Oral Daily  .  digoxin  0.125 mg Oral Daily  . furosemide  40 mg Oral Daily  . insulin aspart  0-15 Units Subcutaneous TID WC  . insulin aspart  0-5 Units Subcutaneous QHS  . ipratropium-albuterol  3 mL Nebulization BID  . nitroGLYCERIN  5-200 mcg/min Intravenous Once  . pantoprazole  40 mg Oral Q1200  . sacubitril-valsartan  1 tablet Oral BID  . sodium chloride flush  10-40 mL Intracatheter Q12H  . spironolactone  12.5 mg Oral Daily  . Warfarin - Pharmacist Dosing Inpatient   Does not apply q1800    Infusions:    PRN Medications: hydrALAZINE, sodium chloride flush   Assessment:   1. Acute Respiratory Failure 2. 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 ? HCAP 4. CAD- 09/2015 DES to LAD  5. CVA 09/2015  6. MRSA bacteremia 7. Hypokalemia 8. HTN   Plan/Discussion:    Much improved. Tolerating Entresto.Can send home today. Nutrition consult appreciated. It will clearly be a challenge to keep him out of the  hospital  Meds on discharge:  Entresto 49/51 bid (Case management consult called to help him obtain - I do not see the consult note on the chart) Cleda Daub 25 daily (double home dose) Plaxix 75 Warfarin  (can stop ASA) Atorva 80 Digoxin 0.125 daily (new) Lasix 40 daily (double home dose) Carvedilol 3.125 bid (half of home dose)  F/u in HF Clinic ([phone]).     Length of Stay: 4  Arvilla Meres MD 01/09/2016, 8:06 AM  Advanced Heart Failure Team Pager [phone] (M-F; 7a - 4p)  Please contact [facility] for night-coverage after hours (4p -7a ) and weekends on [email or web address]

## 2016-01-10 LAB — CULTURE, BLOOD (ROUTINE X 2): Culture: NO GROWTH

## 2016-01-10 LAB — GLUCOSE, CAPILLARY: Glucose-Capillary: 135 mg/dL — ABNORMAL HIGH (ref 65–99)

## 2016-01-11 ENCOUNTER — Telehealth (HOSPITAL_COMMUNITY): Payer: Self-pay | Admitting: Surgery

## 2016-01-11 NOTE — Telephone Encounter (Signed)
I attempted to call patient to follow-up after recent hospitalization and insure post hosp follow-up appt for HF as I do not see an appt noted in chart.  There was no answer and not an opportunity to leave a message.  I will forward message to Eddie [name] to attempt to call patient again and schedule appt.

## 2016-01-12 ENCOUNTER — Ambulatory Visit: Payer: BLUE CROSS/BLUE SHIELD | Admitting: Physical Therapy

## 2016-01-14 ENCOUNTER — Telehealth (HOSPITAL_COMMUNITY): Payer: Self-pay

## 2016-01-14 ENCOUNTER — Telehealth (HOSPITAL_COMMUNITY): Payer: Self-pay | Admitting: Vascular Surgery

## 2016-01-14 ENCOUNTER — Ambulatory Visit: Payer: BLUE CROSS/BLUE SHIELD

## 2016-01-14 NOTE — Telephone Encounter (Signed)
Wife called asking for order to be faxed to [facility] to resume therapies. Called facility to verify what was needed, assistant states they needed order faxed to resume PT and speech therapies. Order signed by Dr. Shirlee Latch and faxed to provided # [phone]. Copy of order scanned into patient's electronic medical record.  Ave Filter

## 2016-01-14 NOTE — Telephone Encounter (Signed)
Pt phone does not have voice mail will try back later

## 2016-01-19 ENCOUNTER — Ambulatory Visit: Payer: BLUE CROSS/BLUE SHIELD | Admitting: Physical Therapy

## 2016-01-19 ENCOUNTER — Encounter (HOSPITAL_COMMUNITY): Payer: Self-pay | Admitting: Vascular Surgery

## 2016-01-19 ENCOUNTER — Telehealth (HOSPITAL_COMMUNITY): Payer: Self-pay | Admitting: Vascular Surgery

## 2016-01-19 VITALS — BP 166/93 | HR 74

## 2016-01-19 DIAGNOSIS — R2689 Other abnormalities of gait and mobility: Secondary | ICD-10-CM

## 2016-01-19 DIAGNOSIS — R262 Difficulty in walking, not elsewhere classified: Secondary | ICD-10-CM

## 2016-01-19 DIAGNOSIS — W19XXXD Unspecified fall, subsequent encounter: Secondary | ICD-10-CM

## 2016-01-19 DIAGNOSIS — I69354 Hemiplegia and hemiparesis following cerebral infarction affecting left non-dominant side: Secondary | ICD-10-CM

## 2016-01-19 DIAGNOSIS — R2681 Unsteadiness on feet: Secondary | ICD-10-CM

## 2016-01-19 NOTE — Therapy (Signed)
[facility] [address] [place], [place], [address] Phone: [phone]   Fax:  [phone]  Physical Therapy Treatment  Patient Details  Name: Eddie [name] MRN: [id number] Date of Birth: 1951-05-05 Referring Provider: Dr. [name]  Encounter Date: 01/19/2016      PT End of Session - 01/19/16 1639    Visit Number 5   Number of Visits 20   Date for PT Re-Evaluation 03/03/16   Authorization Type [employer]-30 visit combo limit   PT Start Time 0847   PT Stop Time 0933   PT Time Calculation (min) 46 min   Equipment Utilized During Treatment Gait belt   Activity Tolerance Patient tolerated treatment well   Behavior During Therapy  [facility] for tasks assessed/performed      Past Medical History  Diagnosis Date  . PUD (peptic ulcer disease)   . CAD (coronary artery disease)     a. STEMI 09/2015 w/ DES to Prox LAD  . CVA (cerebral infarction)     a. 09/2015: acute small right frontal lobe infarct    Past Surgical History  Procedure Laterality Date  . Repair of peptic ulcer    . Cardiac catheterization N/A 09/28/2015    Procedure: Left Heart Cath and Coronary Angiography;  Surgeon: Runell Gess, MD;  Location: [facility] INVASIVE CV LAB;  Service: Cardiovascular;  Laterality: N/A;  . Cardiac catheterization N/A 10/08/2015    Procedure: Left Heart Cath and Coronary Angiography;  Surgeon: Lyn Records, MD;  Location: [facility] INVASIVE CV LAB;  Service: Cardiovascular;  Laterality: N/A;    Filed Vitals:   01/19/16 0858 01/19/16 0909 01/19/16 0915  BP: 147/82 166/93   Pulse: 68 74   SpO2: 92% 87% 86%        Subjective Assessment - 01/19/16 0850    Subjective First time back at PT since hospital 01/05/2016-01/09/2016 for respiratory failure and CHF. He reports no breathing problems since leaving the hospital but "I feel a little more wobbly".  He experienced a fall out in the yard after being outdoors for a couple of hours, he fell  backwards & wife caught him. He has constant suprevisioin as his neighbor stays with him while his wife works until 4.    Patient is accompained by: Family member  wife   Pertinent History MI, HTN, DM, CAD, L eye blindness   Patient Stated Goals Walk without cane, wash car without losing balance   Currently in Pain? No/denies            [facility] PT Assessment - 01/19/16 0845    Precautions   Precautions Other (comment);Fall   Precaution Comments cardiac percautions- salt intake, weighing every morning   Balance Screen   Has the patient fallen in the past 6 months Yes   How many times? 1  Out in the yard for a couple hours   Has the patient had a decrease in activity level because of a fear of falling?  No   Is the patient reluctant to leave their home because of a fear of falling?  No   ROM / Strength   AROM / PROM / Strength Strength   Strength   Overall Strength Comments Gross assessment of RLE: 5/5.    Strength Assessment Site Ankle;Knee   Right/Left Knee Right;Left   Right Knee Extension 4/5   Left Knee Extension 5/5   Right/Left Ankle Left;Right   Right Ankle Dorsiflexion 3+/5   Left Ankle Dorsiflexion 4/5   Ambulation/Gait  Ambulation/Gait Yes   Ambulation/Gait Assistance 5: Supervision   Ambulation Distance (Feet) 400 Feet  x1, 40x1   Assistive device None   Gait Pattern Step-through pattern;Decreased stride length;Right foot flat;Left foot flat;Trendelenburg;Narrow base of support   Ambulation Surface Level;Indoor   Gait velocity 2.72 ft/s   Standardized Balance Assessment   Standardized Balance Assessment Berg Balance Test   Berg Balance Test   Sit to Stand Able to stand without using hands and stabilize independently   Standing Unsupported Able to stand safely 2 minutes   Sitting with Back Unsupported but Feet Supported on Floor or Stool Able to sit safely and securely 2 minutes   Stand to Sit Sits safely with minimal use of hands   Transfers Able to transfer  safely, minor use of hands   Standing Unsupported with Eyes Closed Able to stand 10 seconds safely   Standing Ubsupported with Feet Together Able to place feet together independently and stand 1 minute safely   From Standing, Reach Forward with Outstretched Arm Can reach forward >5 cm safely (2")   From Standing Position, Pick up Object from Floor Able to pick up shoe safely and easily   From Standing Position, Turn to Look Behind Over each Shoulder Needs supervision when turning   Turn 360 Degrees Able to turn 360 degrees safely but slowly   Standing Unsupported, Alternately Place Feet on Step/Stool Able to complete >2 steps/needs minimal assist   Standing Unsupported, One Foot in Front Able to take small step independently and hold 30 seconds   Standing on One Leg Tries to lift leg/unable to hold 3 seconds but remains standing independently   Total Score 41   Functional Gait  Assessment   Gait assessed  Yes   Gait Level Surface Walks 20 ft, slow speed, abnormal gait pattern, evidence for imbalance or deviates 10-15 in outside of the 12 in walkway width. Requires more than 7 sec to ambulate 20 ft.   Change in Gait Speed Makes only minor adjustments to walking speed, or accomplishes a change in speed with significant gait deviations, deviates 10-15 in outside the 12 in walkway width, or changes speed but loses balance but is able to recover and continue walking.   Gait with Horizontal Head Turns Performs head turns smoothly with slight change in gait velocity (eg, minor disruption to smooth gait path), deviates 6-10 in outside 12 in walkway width, or uses an assistive device.   Gait with Vertical Head Turns Performs task with slight change in gait velocity (eg, minor disruption to smooth gait path), deviates 6 - 10 in outside 12 in walkway width or uses assistive device   Gait and Pivot Turn Pivot turns safely in greater than 3 sec and stops with no loss of balance, or pivot turns safely within 3  sec and stops with mild imbalance, requires small steps to catch balance.   Step Over Obstacle Is able to step over one shoe box (4.5 in total height) but must slow down and adjust steps to clear box safely. May require verbal cueing.   Gait with Narrow Base of Support Ambulates less than 4 steps heel to toe or cannot perform without assistance.   Gait with Eyes Closed Walks 20 ft, slow speed, abnormal gait pattern, evidence for imbalance, deviates 10-15 in outside 12 in walkway width. Requires more than 9 sec to ambulate 20 ft.   Ambulating Backwards Walks 20 ft, slow speed, abnormal gait pattern, evidence for imbalance, deviates 10-15 in outside  12 in walkway width.   Steps Alternating feet, must use rail.   Total Score 13                     [facility] Adult PT Treatment/Exercise - 01/19/16 0845    Ambulation/Gait   Ambulation/Gait Assistance Details Gait with no assistive device pt required supervision, after gait for approximately 400' the patient had shortness of breath and decreased O2 stats from 92% to 87%  see vital signs section for oxygen levels                PT Education - 01/19/16 1639    Education provided Yes   Education Details PT POC for continuing care, using pulse oximeter to monitor oxygen levels, reviewed using weight changes to determine fluid retention that can lead to CHF issues.   Person(s) Educated Patient;Spouse   Methods Explanation;Verbal cues   Comprehension Verbalized understanding;Verbal cues required;Need further instruction          PT Short Term Goals - 01/19/16 1647            PT Long Term Goals - 01/19/16 1640    PT LONG TERM GOAL #1   Title Pt will be IND in HEP to improve balance and strength. (New Target date: 03/03/16)   Baseline All unmet goals will be carried over to new POC: 01/27/16   Status Revised   PT LONG TERM GOAL #2   Title Pt will improve FGA score to >/=25/30 to decr. falls risk. (New Target date: 03/03/16)    Baseline All unmet goals will be carried over to new POC: 01/27/16   Status Revised   PT LONG TERM GOAL #3   Title Pt will amb. 300' modified independent with LRAD, over uneven terrain (sand, grass), in order to improve functional mobility and to go to the beach this summer/fall. (New Target date: 03/03/16)   Baseline All unmet goals will be carried over to new POC: 01/27/16   Status Revised   PT LONG TERM GOAL #4   Title Pt will amb. 800' over paved surfaces (even) modified independent, no AD, in order to walk dog and improve functional mobility. (New Target date: 03/03/16)   Baseline All unmet goals will be carried over to new POC: 01/27/16   Status Revised   PT LONG TERM GOAL #5   Title Pt will verbalize walking program and potentially how to progress to light jogging based on progress, in order to walk/jog with dog. Target date: 11/30/15   Baseline All unmet goals will be carried over to new POC: 01/27/16   Status Deferred               Plan - 01/19/16 1647    Clinical Impression Statement This patient is being seen after a hospitalization for Respiratory failure and CHF 01/05/16- 01/09/16 with PT re-evaluation done today. There were no noted differences in strength since intial evaluation. However, during this session the patient demostrated decreased balance Berg Balance Score 41/56 and Functional Gait Assessment score 13/19, both indicating an increased risk for falls. After ambulating 400' with no AD the patients O2 staturation decreased from 92% to 87% on room air with normal functional activities. This patient would benefit from skilled PT to address above impairments and improve community mobility .Marland Kitchen   Rehab Potential Good   Clinical Impairments Affecting Rehab Potential co-morbidities   PT Frequency 2x / week   PT Duration 6 weeks  PT requesting additional 2x/week for  4 weeks   PT Treatment/Interventions ADLs/Self Care Home Management;Biofeedback;Canalith Repostioning;Therapeutic  activities;Therapeutic exercise;Balance training;Manual techniques;Vestibular;Orthotic Fit/Training;Functional mobility training;Stair training;Gait training;DME Instruction;Patient/family education;Cognitive remediation   PT Next Visit Plan Check B& oxygen levels, If oxygen levels continue to decline into 80's with routine activities (not exercise) PT will contact his PCP. assess HEP and progress/modify as needed.    PT Home Exercise Plan Strengthening, balance, and flexibility HEP   Consulted and Agree with Plan of Care Patient   Family Member Consulted pt's wife: Eddie [name]      Patient will benefit from skilled therapeutic intervention in order to improve the following deficits and impairments:  Abnormal gait, Decreased endurance, Decreased knowledge of use of DME, Decreased strength, Impaired flexibility, Decreased cognition, Decreased balance, Decreased mobility, Decreased activity tolerance  Visit Diagnosis: Other abnormalities of gait and mobility  Hemiplegia and hemiparesis following cerebral infarction affecting left non-dominant side (HCC)  Unsteadiness on feet  Fall, subsequent encounter  Difficulty in walking, not elsewhere classified     Problem List Patient Active Problem List   Diagnosis Date Noted  . Esophageal reflux   . Elevated troponin   . Cardiorenal syndrome   . HTN (hypertension), malignant   . Cerebrovascular accident (CVA) due to embolism of cerebral artery (HCC)   . Acute systolic heart failure (HCC)   . Acute on chronic combined systolic and diastolic heart failure (HCC)   . Pulmonary edema   . Encounter for feeding tube placement   . Hypertensive emergency 12/20/2015  . UTI (urinary tract infection) 12/14/2015  . Bacteremia, escherichia coli 12/14/2015  . Respiratory failure (HCC) 12/11/2015  . Acute respiratory failure with hypercapnia (HCC)   . Demand ischemia (HCC)   . Gait disturbance, post-stroke 12/09/2015  . Cognitive deficit, post-stroke  12/09/2015  . Numbness and tingling of both legs below knees 12/09/2015  . Long term (current) use of anticoagulants [Z79.01] 11/01/2015  . Ischemic stroke of frontal lobe (HCC) 10/15/2015  . Anoxic encephalopathy (HCC) 10/15/2015  . Right-sided cerebrovascular accident (CVA) (HCC)   . Acute on chronic systolic congestive heart failure (HCC)   . HCAP (healthcare-associated pneumonia)   . Dysphagia as late effect of cerebrovascular disease   . Tobacco abuse   . Tachypnea   . Essential hypertension   . Type 2 diabetes mellitus with complication (HCC)   . Leukocytosis   . Absolute anemia   . Altered mental status   . Hemiplegia (HCC)   . Stroke (HCC)   . ARDS (adult respiratory distress syndrome) (HCC)   . Acute respiratory failure (HCC)   . STEMI (ST elevation myocardial infarction) (HCC) 09/28/2015  . Cardiac arrest (HCC)   . ST elevation myocardial infarction (STEMI) (HCC)   . Encounter for central line placement   . Hypokalemia   . Encephalopathy acute   . Acute hypoxemic respiratory failure (HCC)    Eddie [name],SPT 01/19/2016, 4:57 PM  Eddie [name], PT, DPT PT Specializing in Prosthetics & Orthotics 01/20/2016 12:13 PM Phone:  [phone]  Fax:  [phone] [facility] [address] [place], [place] [address]   [facility] [address] [place], [place], [address] Phone: [phone]   Fax:  [phone]  Name: Eddie [name] MRN: [id number] Date of Birth: 1950-12-11

## 2016-01-19 NOTE — Telephone Encounter (Signed)
Will send pt letter to make appt 

## 2016-01-21 ENCOUNTER — Ambulatory Visit: Payer: BLUE CROSS/BLUE SHIELD | Admitting: Physical Therapy

## 2016-01-21 ENCOUNTER — Encounter: Payer: Self-pay | Admitting: Physical Therapy

## 2016-01-21 VITALS — BP 140/84 | HR 74

## 2016-01-21 DIAGNOSIS — R2681 Unsteadiness on feet: Secondary | ICD-10-CM

## 2016-01-21 DIAGNOSIS — R262 Difficulty in walking, not elsewhere classified: Secondary | ICD-10-CM

## 2016-01-21 DIAGNOSIS — R2689 Other abnormalities of gait and mobility: Secondary | ICD-10-CM

## 2016-01-21 DIAGNOSIS — I69354 Hemiplegia and hemiparesis following cerebral infarction affecting left non-dominant side: Secondary | ICD-10-CM

## 2016-01-21 DIAGNOSIS — W19XXXD Unspecified fall, subsequent encounter: Secondary | ICD-10-CM

## 2016-01-21 NOTE — Therapy (Signed)
[facility] ([facility] Health [facility] [address] [place], [place], [address] Phone: [phone]   Fax:  [phone]  Physical Therapy Treatment  Patient Details  Name: Eddie [name] MRN: [id number] Date of Birth: Aug 04, 1950 Referring Provider: Dr. [name]  Encounter Date: 01/21/2016      PT End of Session - 01/21/16 0809    Visit Number 6   Number of Visits 20   Date for PT Re-Evaluation 03/03/16   Authorization Type BC[employer]-30 visit combo limit   PT Start Time 0805   PT Stop Time 0845   PT Time Calculation (min) 40 min   Equipment Utilized During Treatment Gait belt   Activity Tolerance Patient tolerated treatment well   Behavior During Therapy [facility] for tasks assessed/performed      Past Medical History  Diagnosis Date  . PUD (peptic ulcer disease)   . CAD (coronary artery disease)     a. STEMI 09/2015 w/ DES to Prox LAD  . CVA (cerebral infarction)     a. 09/2015: acute small right frontal lobe infarct    Past Surgical History  Procedure Laterality Date  . Repair of peptic ulcer    . Cardiac catheterization N/A 09/28/2015    Procedure: Left Heart Cath and Coronary Angiography;  Surgeon: Runell Gess, MD;  Location: [facility] INVASIVE CV LAB;  Service: Cardiovascular;  Laterality: N/A;  . Cardiac catheterization N/A 10/08/2015    Procedure: Left Heart Cath and Coronary Angiography;  Surgeon: Lyn Records, MD;  Location: [facility] INVASIVE CV LAB;  Service: Cardiovascular;  Laterality: N/A;    Filed Vitals:   01/21/16 0809 01/21/16 0841  BP: 148/82 before session 140/84 after session  Pulse: 74 74  SpO2: 99% 99%         [facility] Adult PT Treatment/Exercise - 01/21/16 0824    Ambulation/Gait   Ambulation/Gait Yes   Ambulation/Gait Assistance 5: Supervision   Ambulation/Gait Assistance Details 5 minutes at pt selected pace, no balance issues noted. HR 74, SaO2 99% afterwards.   Assistive device None   Gait Pattern Step-through  pattern;Decreased stride length;Right foot flat;Left foot flat;Trendelenburg;Narrow base of support   Ambulation Surface Level;Indoor     Treatment continued: Reviewed and pt performed all exercises issued to pt to date. No issues noted. SaO2 monitored through out session with SaO2 levels >/= 93% with activity. Pt needed occasional cues for correct ex form and technique.         PT Short Term Goals - 01/19/16 1647            PT Long Term Goals - 01/19/16 1640    PT LONG TERM GOAL #1   Title Pt will be IND in HEP to improve balance and strength. (New Target date: 03/03/16)   Baseline All unmet goals will be carried over to new POC: 01/27/16   Status Revised   PT LONG TERM GOAL #2   Title Pt will improve FGA score to >/=25/30 to decr. falls risk. (New Target date: 03/03/16)   Baseline All unmet goals will be carried over to new POC: 01/27/16   Status Revised   PT LONG TERM GOAL #3   Title Pt will amb. 300' modified independent with LRAD, over uneven terrain (sand, grass), in order to improve functional mobility and to go to the beach this summer/fall. (New Target date: 03/03/16)   Baseline All unmet goals will be carried over to new POC: 01/27/16   Status Revised   PT LONG TERM GOAL #4  Title Pt will amb. 800' over paved surfaces (even) modified independent, no AD, in order to walk dog and improve functional mobility. (New Target date: 03/03/16)   Baseline All unmet goals will be carried over to new POC: 01/27/16   Status Revised   PT LONG TERM GOAL #5   Title Pt will verbalize walking program and potentially how to progress to light jogging based on progress, in order to walk/jog with dog. Target date: 11/30/15   Baseline All unmet goals will be carried over to new POC: 01/27/16   Status Deferred            Plan - 01/21/16 0809    Clinical Impression Statement Pt's current HEP continues to be appropriate for pt. SaO2 levels remained stable throughout session today and BP was at  stable level as well. Pt is making slow, steady progress toward goals   Rehab Potential Good   Clinical Impairments Affecting Rehab Potential co-morbidities   PT Frequency 2x / week   PT Duration 6 weeks  PT requesting additional 2x/week for 4 weeks   PT Treatment/Interventions ADLs/Self Care Home Management;Biofeedback;Canalith Repostioning;Therapeutic activities;Therapeutic exercise;Balance training;Manual techniques;Vestibular;Orthotic Fit/Training;Functional mobility training;Stair training;Gait training;DME Instruction;Patient/family education;Cognitive remediation   PT Next Visit Plan Check BP & oxygen levels, If oxygen levels continue to decline into 80's with routine activities (not exercise) PT will contact his PCP. continue to progress gait, balance and strengthening toward goals.   PT Home Exercise Plan Strengthening, balance, and flexibility HEP   Consulted and Agree with Plan of Care Patient   Family Member Consulted pt's wife: [name]      Patient will benefit from skilled therapeutic intervention in order to improve the following deficits and impairments:  Abnormal gait, Decreased endurance, Decreased knowledge of use of DME, Decreased strength, Impaired flexibility, Decreased cognition, Decreased balance, Decreased mobility, Decreased activity tolerance  Visit Diagnosis: Other abnormalities of gait and mobility  Hemiplegia and hemiparesis following cerebral infarction affecting left non-dominant side (HCC)  Unsteadiness on feet  Fall, subsequent encounter  Difficulty in walking, not elsewhere classified     Problem List Patient Active Problem List   Diagnosis Date Noted  . Esophageal reflux   . Elevated troponin   . Cardiorenal syndrome   . HTN (hypertension), malignant   . Cerebrovascular accident (CVA) due to embolism of cerebral artery (HCC)   . Acute systolic heart failure (HCC)   . Acute on chronic combined systolic and diastolic heart failure (HCC)   .  Pulmonary edema   . Encounter for feeding tube placement   . Hypertensive emergency 12/20/2015  . UTI (urinary tract infection) 12/14/2015  . Bacteremia, escherichia coli 12/14/2015  . Respiratory failure (HCC) 12/11/2015  . Acute respiratory failure with hypercapnia (HCC)   . Demand ischemia (HCC)   . Gait disturbance, post-stroke 12/09/2015  . Cognitive deficit, post-stroke 12/09/2015  . Numbness and tingling of both legs below knees 12/09/2015  . Long term (current) use of anticoagulants [Z79.01] 11/01/2015  . Ischemic stroke of frontal lobe (HCC) 10/15/2015  . Anoxic encephalopathy (HCC) 10/15/2015  . Right-sided cerebrovascular accident (CVA) (HCC)   . Acute on chronic systolic congestive heart failure (HCC)   . HCAP (healthcare-associated pneumonia)   . Dysphagia as late effect of cerebrovascular disease   . Tobacco abuse   . Tachypnea   . Essential hypertension   . Type 2 diabetes mellitus with complication (HCC)   . Leukocytosis   . Absolute anemia   . Altered mental status   .  Hemiplegia (HCC)   . Stroke (HCC)   . ARDS (adult respiratory distress syndrome) (HCC)   . Acute respiratory failure (HCC)   . STEMI (ST elevation myocardial infarction) (HCC) 09/28/2015  . Cardiac arrest (HCC)   . ST elevation myocardial infarction (STEMI) (HCC)   . Encounter for central line placement   . Hypokalemia   . Encephalopathy acute   . Acute hypoxemic respiratory failure (HCC)     Sallyanne Kuster, PTA, [facility] [address], [address] [place], [place] [address] [phone] 01/21/2016, 6:38 PM   Name: JACKSTON OAXACA MRN: [id number] Date of Birth: Aug 20, 1950

## 2016-01-26 ENCOUNTER — Ambulatory Visit: Payer: BLUE CROSS/BLUE SHIELD | Attending: Physical Medicine & Rehabilitation | Admitting: Physical Therapy

## 2016-01-26 VITALS — BP 130/83 | HR 74

## 2016-01-26 DIAGNOSIS — I69354 Hemiplegia and hemiparesis following cerebral infarction affecting left non-dominant side: Secondary | ICD-10-CM | POA: Insufficient documentation

## 2016-01-26 DIAGNOSIS — R2689 Other abnormalities of gait and mobility: Secondary | ICD-10-CM | POA: Insufficient documentation

## 2016-01-26 DIAGNOSIS — R41841 Cognitive communication deficit: Secondary | ICD-10-CM | POA: Insufficient documentation

## 2016-01-26 DIAGNOSIS — R498 Other voice and resonance disorders: Secondary | ICD-10-CM | POA: Insufficient documentation

## 2016-01-26 DIAGNOSIS — R1313 Dysphagia, pharyngeal phase: Secondary | ICD-10-CM | POA: Insufficient documentation

## 2016-01-26 DIAGNOSIS — R2681 Unsteadiness on feet: Secondary | ICD-10-CM | POA: Insufficient documentation

## 2016-01-26 NOTE — Therapy (Signed)
[facility] [address] [place], [place], [address] Phone: [phone]   Fax:  [phone]  Physical Therapy Treatment  Patient Details  Name: Eddie [name] MRN: [id number] Date of Birth: 02-Jun-1951 Referring Provider: Dr. [name]  Encounter Date: 01/26/2016      PT End of Session - 01/26/16 1001    Visit Number 7   Number of Visits 20   Date for PT Re-Evaluation 03/03/16   Authorization Type BCBS-30 visit combo limit   PT Start Time 0809  pt arrived late for appointment   PT Stop Time 0849   PT Time Calculation (min) 40 min   Activity Tolerance Patient tolerated treatment well   Behavior During Therapy [facility] for tasks assessed/performed      Past Medical History  Diagnosis Date  . PUD (peptic ulcer disease)   . CAD (coronary artery disease)     a. STEMI 09/2015 w/ DES to Prox LAD  . CVA (cerebral infarction)     a. 09/2015: acute small right frontal lobe infarct    Past Surgical History  Procedure Laterality Date  . Repair of peptic ulcer    . Cardiac catheterization N/A 09/28/2015    Procedure: Left Heart Cath and Coronary Angiography;  Surgeon: Runell Gess, MD;  Location: [facility] INVASIVE CV LAB;  Service: Cardiovascular;  Laterality: N/A;  . Cardiac catheterization N/A 10/08/2015    Procedure: Left Heart Cath and Coronary Angiography;  Surgeon: Lyn Records, MD;  Location: [facility] INVASIVE CV LAB;  Service: Cardiovascular;  Laterality: N/A;    Filed Vitals:   01/26/16 0700 01/26/16 0831 01/26/16 0846  BP: 146/86 164/83 130/83  Pulse: 70 77 74  SpO2: 99% 99% 96%        Subjective Assessment - 01/26/16 0813    Subjective Pt reports his balance feels good today but unsteady some mornings.   Patient is accompained by: Family member  wife   Pertinent History MI, HTN, DM, CAD, L eye blindness   Patient Stated Goals Walk without cane, wash car without losing balance   Currently in Pain? No/denies         Pt performed: Bil leg press 70# x 15, 80# x 15 and 90# x 15 Standing alternating taps to 6" step with intermittent UE support then taps to 6", 12", 6" and floor with intermittent UE assist Steps ups forward and lateral to 6" step with 1 UE assist x 15 bil sides Steps down 6" step with bil UE assist x 15 bil sides Standing in parallel bars with UE support for bil heel raises x 15;With 3# weight for bil hip abduction, hip extension, straight leg hip flexion, marching and hamstring curl all x 15 See vitals for details-checked throughout treatment      PT Short Term Goals - 01/19/16 1647            PT Long Term Goals - 01/19/16 1640    PT LONG TERM GOAL #1   Title Pt will be IND in HEP to improve balance and strength. (New Target date: 03/03/16)   Baseline All unmet goals will be carried over to new POC: 01/27/16   Status Revised   PT LONG TERM GOAL #2   Title Pt will improve FGA score to >/=25/30 to decr. falls risk. (New Target date: 03/03/16)   Baseline All unmet goals will be carried over to new POC: 01/27/16   Status Revised   PT LONG TERM GOAL #3  Title Pt will amb. 300' modified independent with LRAD, over uneven terrain (sand, grass), in order to improve functional mobility and to go to the beach this summer/fall. (New Target date: 03/03/16)   Baseline All unmet goals will be carried over to new POC: 01/27/16   Status Revised   PT LONG TERM GOAL #4   Title Pt will amb. 800' over paved surfaces (even) modified independent, no AD, in order to walk dog and improve functional mobility. (New Target date: 03/03/16)   Baseline All unmet goals will be carried over to new POC: 01/27/16   Status Revised   PT LONG TERM GOAL #5   Title Pt will verbalize walking program and potentially how to progress to light jogging based on progress, in order to walk/jog with dog. Target date: 11/30/15   Baseline All unmet goals will be carried over to new POC: 01/27/16   Status Deferred                Plan - 01/26/16 1002    Clinical Impression Statement Pt's SaO2 and BP remained stable throughout treatment session.  Pt continues to be motivated to improve strength and balance.  Continue PT per POC.     Rehab Potential Good   Clinical Impairments Affecting Rehab Potential co-morbidities   PT Frequency 2x / week   PT Duration 6 weeks  PT requesting additional 2x/week for 4 weeks   PT Treatment/Interventions ADLs/Self Care Home Management;Biofeedback;Canalith Repostioning;Therapeutic activities;Therapeutic exercise;Balance training;Manual techniques;Vestibular;Orthotic Fit/Training;Functional mobility training;Stair training;Gait training;DME Instruction;Patient/family education;Cognitive remediation   PT Next Visit Plan Check BP & oxygen levels, If oxygen levels continue to decline into 80's with routine activities (not exercise) PT will contact his PCP.  Add strengthening exercises with weights to HEP (pt has weights at home).   PT Home Exercise Plan Strengthening, balance, and flexibility HEP   Consulted and Agree with Plan of Care Patient   Family Member Consulted pt's wife: [name]      Patient will benefit from skilled therapeutic intervention in order to improve the following deficits and impairments:  Abnormal gait, Decreased endurance, Decreased knowledge of use of DME, Decreased strength, Impaired flexibility, Decreased cognition, Decreased balance, Decreased mobility, Decreased activity tolerance  Visit Diagnosis: Other abnormalities of gait and mobility     Problem List Patient Active Problem List   Diagnosis Date Noted  . Esophageal reflux   . Elevated troponin   . Cardiorenal syndrome   . HTN (hypertension), malignant   . Cerebrovascular accident (CVA) due to embolism of cerebral artery (HCC)   . Acute systolic heart failure (HCC)   . Acute on chronic combined systolic and diastolic heart failure (HCC)   . Pulmonary edema   . Encounter for feeding  tube placement   . Hypertensive emergency 12/20/2015  . UTI (urinary tract infection) 12/14/2015  . Bacteremia, escherichia coli 12/14/2015  . Respiratory failure (HCC) 12/11/2015  . Acute respiratory failure with hypercapnia (HCC)   . Demand ischemia (HCC)   . Gait disturbance, post-stroke 12/09/2015  . Cognitive deficit, post-stroke 12/09/2015  . Numbness and tingling of both legs below knees 12/09/2015  . Long term (current) use of anticoagulants [Z79.01] 11/01/2015  . Ischemic stroke of frontal lobe (HCC) 10/15/2015  . Anoxic encephalopathy (HCC) 10/15/2015  . Right-sided cerebrovascular accident (CVA) (HCC)   . Acute on chronic systolic congestive heart failure (HCC)   . HCAP (healthcare-associated pneumonia)   . Dysphagia as late effect of cerebrovascular disease   . Tobacco abuse   .  Tachypnea   . Essential hypertension   . Type 2 diabetes mellitus with complication (HCC)   . Leukocytosis   . Absolute anemia   . Altered mental status   . Hemiplegia (HCC)   . Stroke (HCC)   . ARDS (adult respiratory distress syndrome) (HCC)   . Acute respiratory failure (HCC)   . STEMI (ST elevation myocardial infarction) (HCC) 09/28/2015  . Cardiac arrest (HCC)   . ST elevation myocardial infarction (STEMI) (HCC)   . Encounter for central line placement   . Hypokalemia   . Encephalopathy acute   . Acute hypoxemic respiratory failure (HCC)     Newell Coral 01/26/2016, 10:04 AM  [facility] [address] [place], [place], [address] Phone: [phone]   Fax:  [phone]  Name: TAMEL ABEL MRN: [id number] Date of Birth: [birth year]    Newell Coral, [place] [facility] 01/26/2016 10:04 AM Phone: [phone] Fax: [phone]

## 2016-01-27 ENCOUNTER — Ambulatory Visit: Payer: BLUE CROSS/BLUE SHIELD | Admitting: Physical Therapy

## 2016-01-27 ENCOUNTER — Ambulatory Visit: Payer: BLUE CROSS/BLUE SHIELD

## 2016-01-27 VITALS — BP 164/92 | HR 66

## 2016-01-27 DIAGNOSIS — R41841 Cognitive communication deficit: Secondary | ICD-10-CM

## 2016-01-27 DIAGNOSIS — R2689 Other abnormalities of gait and mobility: Secondary | ICD-10-CM

## 2016-01-27 DIAGNOSIS — R1313 Dysphagia, pharyngeal phase: Secondary | ICD-10-CM

## 2016-01-27 DIAGNOSIS — I69354 Hemiplegia and hemiparesis following cerebral infarction affecting left non-dominant side: Secondary | ICD-10-CM

## 2016-01-27 DIAGNOSIS — R498 Other voice and resonance disorders: Secondary | ICD-10-CM

## 2016-01-27 NOTE — Patient Instructions (Addendum)
WHEN YOU EAT: Swallow again when its all out of your mouth Small sips/bites Slow down Eat with TV off                When you eat fresh fruits or vegetables, TAKE SMALLER BITES

## 2016-01-27 NOTE — Therapy (Signed)
[facility] [address] [place], [place], [address] Phone: [phone]   Fax:  [phone]  Physical Therapy Treatment  Patient Details  Name: Eddie [name] MRN: [id number] Date of Birth: 02/07/1951 Referring Provider: Dr. [name]  Encounter Date: 01/27/2016      PT End of Session - 01/27/16 1236    Visit Number 8   Number of Visits 20   Date for PT Re-Evaluation 03/03/16   Authorization Type BCBS-30 visit combo limit   PT Start Time 0847   PT Stop Time 0932   PT Time Calculation (min) 45 min   Activity Tolerance Patient tolerated treatment well   Behavior During Therapy [name] for tasks assessed/performed      Past Medical History  Diagnosis Date  . PUD (peptic ulcer disease)   . CAD (coronary artery disease)     a. STEMI 09/2015 w/ DES to Prox LAD  . CVA (cerebral infarction)     a. 09/2015: acute small right frontal lobe infarct    Past Surgical History  Procedure Laterality Date  . Repair of peptic ulcer    . Cardiac catheterization N/A 09/28/2015    Procedure: Left Heart Cath and Coronary Angiography;  Surgeon: Runell Gess, MD;  [facility] INVASIVE CV LAB;  Service: Cardiovascular;  Laterality: N/A;  . Cardiac catheterization N/A 10/08/2015    Procedure: Left Heart Cath and Coronary Angiography;  Surgeon: Lyn Records, MD;  Location: [facility] INVASIVE CV LAB;  Service: Cardiovascular;  Laterality: N/A;    Filed Vitals:   01/27/16 0700 01/27/16 0900  BP: 155/93 164/92  Pulse: 64 66  SpO2: 99% 98%        Subjective Assessment - 01/27/16 1231    Subjective Denies falls or changes.  Does not feel sore from treatment yesterday.    Patient is accompained by: Family member  wife   Pertinent History MI, HTN, DM, CAD, L eye blindness   Patient Stated Goals Walk without cane, wash car without losing balance   Currently in Pain? No/denies            [facility] Adult PT Treatment/Exercise - 01/27/16  0001    Knee/Hip Exercises: Aerobic   Nustep Level 2.5 all 4 extremities x 5 minutes with rpm>70   Knee/Hip Exercises: Standing   Other Standing Knee Exercises At counter for bil LE with 4# weight of straight leg hip flexion, marching, hip extension, hamstring curl and hip abduction x 10 reps x 2 sets.             Balance Exercises - 01/27/16 1234    Balance Exercises: Standing   Tandem Stance Eyes open;Upper extremity support 1;2 reps;10 secs   SLS Eyes open;2 reps;10 secs;Upper extremity support 1;Solid surface   Tandem Gait Forward;Upper extremity support;Other (comment);4 reps  8' along counter x 4           PT Education - 01/27/16 1235    Education provided Yes   Education Details HEP, discussing with ST and primary PT about determining remainder of visits due to visit limitation   Person(s) Educated Patient;Spouse   Methods Explanation;Verbal cues;Handout;Demonstration   Comprehension Verbalized understanding;Returned demonstration          PT Short Term Goals - 01/19/16 1647            PT Long Term Goals - 01/19/16 1640    PT LONG TERM GOAL #1   Title Pt will be IND in HEP to improve  balance and strength. (New Target date: 03/03/16)   Baseline All unmet goals will be carried over to new POC: 01/27/16   Status Revised   PT LONG TERM GOAL #2   Title Pt will improve FGA score to >/=25/30 to decr. falls risk. (New Target date: 03/03/16)   Baseline All unmet goals will be carried over to new POC: 01/27/16   Status Revised   PT LONG TERM GOAL #3   Title Pt will amb. 300' modified independent with LRAD, over uneven terrain (sand, grass), in order to improve functional mobility and to go to the beach this summer/fall. (New Target date: 03/03/16)   Baseline All unmet goals will be carried over to new POC: 01/27/16   Status Revised   PT LONG TERM GOAL #4   Title Pt will amb. 800' over paved surfaces (even) modified independent, no AD, in order to walk dog and improve  functional mobility. (New Target date: 03/03/16)   Baseline All unmet goals will be carried over to new POC: 01/27/16   Status Revised   PT LONG TERM GOAL #5   Title Pt will verbalize walking program and potentially how to progress to light jogging based on progress, in order to walk/jog with dog. Target date: 11/30/15   Baseline All unmet goals will be carried over to new POC: 01/27/16   Status Deferred               Plan - 01/27/16 1238    Clinical Impression Statement Pt progressing well with strength and balance and continues to be motivated.  Progressing toward goals.  Continue PT per POC.   Rehab Potential Good   Clinical Impairments Affecting Rehab Potential co-morbidities   PT Frequency 2x / week   PT Duration 6 weeks  PT requesting additional 2x/week for 4 weeks   PT Treatment/Interventions ADLs/Self Care Home Management;Biofeedback;Canalith Repostioning;Therapeutic activities;Therapeutic exercise;Balance training;Manual techniques;Vestibular;Orthotic Fit/Training;Functional mobility training;Stair training;Gait training;DME Instruction;Patient/family education;Cognitive remediation   PT Next Visit Plan Have pt schedule an additional week of PT.  Continue to monitor BP & oxygen levels during session.  Balance/gait on compliant surfaces.   PT Home Exercise Plan Strengthening, balance, and flexibility HEP   Consulted and Agree with Plan of Care Patient   Family Member Consulted pt's wife: [name]      Patient will benefit from skilled therapeutic intervention in order to improve the following deficits and impairments:  Abnormal gait, Decreased endurance, Decreased knowledge of use of DME, Decreased strength, Impaired flexibility, Decreased cognition, Decreased balance, Decreased mobility, Decreased activity tolerance  Visit Diagnosis: Other abnormalities of gait and mobility  Hemiplegia and hemiparesis following cerebral infarction affecting left non-dominant side  [facility])     Problem List Patient Active Problem List   Diagnosis Date Noted  . Esophageal reflux   . Elevated troponin   . Cardiorenal syndrome   . HTN (hypertension), malignant   . Cerebrovascular accident (CVA) due to embolism of cerebral artery (HCC)   . Acute systolic heart failure (HCC)   . Acute on chronic combined systolic and diastolic heart failure (HCC)   . Pulmonary edema   . Encounter for feeding tube placement   . Hypertensive emergency 12/20/2015  . UTI (urinary tract infection) 12/14/2015  . Bacteremia, escherichia coli 12/14/2015  . Respiratory failure (HCC) 12/11/2015  . Acute respiratory failure with hypercapnia (HCC)   . Demand ischemia (HCC)   . Gait disturbance, post-stroke 12/09/2015  . Cognitive deficit, post-stroke 12/09/2015  . Numbness and tingling of both  legs below knees 12/09/2015  . Long term (current) use of anticoagulants [Z79.01] 11/01/2015  . Ischemic stroke of frontal lobe (HCC) 10/15/2015  . Anoxic encephalopathy (HCC) 10/15/2015  . Right-sided cerebrovascular accident (CVA) (HCC)   . Acute on chronic systolic congestive heart failure (HCC)   . HCAP (healthcare-associated pneumonia)   . Dysphagia as late effect of cerebrovascular disease   . Tobacco abuse   . Tachypnea   . Essential hypertension   . Type 2 diabetes mellitus with complication (HCC)   . Leukocytosis   . Absolute anemia   . Altered mental status   . Hemiplegia (HCC)   . Stroke (HCC)   . ARDS (adult respiratory distress syndrome) (HCC)   . Acute respiratory failure (HCC)   . STEMI (ST elevation myocardial infarction) (HCC) 09/28/2015  . Cardiac arrest (HCC)   . ST elevation myocardial infarction (STEMI) (HCC)   . Encounter for central line placement   . Hypokalemia   . Encephalopathy acute   . Acute hypoxemic respiratory failure (HCC)     Newell Coral 01/27/2016, 12:46 PM  [facility] [address]  [address] [place], [place], [address] Phone: [phone]   Fax:  [phone]  Name: Eddie [name] MRN: [id number] Date of Birth: 1951-01-24    Newell Coral, [place] [facility] 01/27/2016 12:46 PM Phone: [phone] Fax: [phone]

## 2016-01-27 NOTE — Patient Instructions (Addendum)
"  I love a Psychologist, prison and probation services for support.  Use a 4# weight on both ankles.  March in place as high as you can.  Alternate legs and do 10 on each leg.  Rest a minute and repeat again 10 times each leg.  Do once a day. http://gt2.exer.us/344   Copyright  VHI. All rights reserved.   Leg Raise: Straight - Standing (Single Leg)    Hold counter for support.  Use a 4# weight on both ankles.  Raise leg forward, keeping knee straight.  Alternate legs doing 10 on each side.  Rest a minute and repeat 10 more times.  Do once a day. http://tub.exer.us/189   Copyright  VHI. All rights reserved.   EXTENSION: Standing (Active)    Hold counter for support.  Use a 4# weight on both ankles.  Stand, both feet flat. Draw leg behind as far as possible keeping knee straight and trunk up tall.  Alternate legs doing 10 on each side.  Rest a minute and do 10 more on each side.  Do once a day.  http://gtsc.exer.us/76   Copyright  VHI. All rights reserved.   HIP: Abduction - Standing (Weight)    Hold counter for support.  Use a 4# weight on both ankles.  Raise leg out to the side.  Alternate legs doing 10 on each side.  Rest a minute and do 10 more.  Do once a day.   Copyright  VHI. All rights reserved.   FLEXION: Standing - Stable (Power)    Hold counter for support.  Use a 4# weight on both ankles.  Stand, both feet flat. Bend right knee, bringing heel toward buttocks  Alternate legs doing 10 on each side.  Rest a minute and do 10 more on each side.  Do once a day.  Copyright  VHI. All rights reserved.   SINGLE LIMB STANCE    Hold counter for support.  Stance: single leg on floor. Raise leg. Hold 10 seconds. Repeat with other leg.  Repeat 2 times once a day.  Copyright  VHI. All rights reserved.   Tandem Stance    Hold counter for support.  Right foot in front of left, heel touching toe both feet "straight ahead". Stand on Foot Triangle of Support with both feet.  Balance in this position 10 seconds. Do with left foot in front of right.  Repeat 2 times each direction once a day.  Copyright  VHI. All rights reserved.   Tandem Walking    Hold a counter for support.  Walk with each foot directly in front of other, heel of one foot touching toes of other foot with each step. Both feet straight ahead.  Do 4 times down counter top.   Copyright  VHI. All rights reserved.

## 2016-01-27 NOTE — Therapy (Signed)
[facility] [address] [place], [place], [address] Phone: [phone]   Fax:  [phone]  Speech Language Pathology Treatment  Patient Details  Name: Eddie [name] MRN: [id number] Date of Birth: August 11, 1950 Referring Provider: Claudette Laws, MD  Encounter Date: 01/27/2016      End of Session - 01/27/16 0919    Visit Number 9   Number of Visits 16   Date for SLP Re-Evaluation 02/22/16   Authorization - Visit Number 9   Authorization - Number of Visits 30   SLP Start Time 0805   SLP Stop Time  0848   SLP Time Calculation (min) 43 min   Activity Tolerance Patient tolerated treatment well      Past Medical History  Diagnosis Date  . PUD (peptic ulcer disease)   . CAD (coronary artery disease)     a. STEMI 09/2015 w/ DES to Prox LAD  . CVA (cerebral infarction)     a. 09/2015: acute small right frontal lobe infarct    Past Surgical History  Procedure Laterality Date  . Repair of peptic ulcer    . Cardiac catheterization N/A 09/28/2015    Procedure: Left Heart Cath and Coronary Angiography;  Surgeon: Runell Gess, MD;  Location: [facility] INVASIVE CV LAB;  Service: Cardiovascular;  Laterality: N/A;  . Cardiac catheterization N/A 10/08/2015    Procedure: Left Heart Cath and Coronary Angiography;  Surgeon: Lyn Records, MD;  Location: [facility] INVASIVE CV LAB;  Service: Cardiovascular;  Laterality: N/A;    There were no vitals filed for this visit.      Subjective Assessment - 01/27/16 0814    Subjective Pt reports he was admitted due to "still eating salt."   Patient is accompained by: Family member  [name]   Currently in Pain? No/denies               ADULT SLP TREATMENT - 01/27/16 0816    General Information   Behavior/Cognition Alert;Cooperative;Pleasant mood   Treatment Provided   Treatment provided Dysphagia   Dysphagia Treatment   Temperature Spikes Noted No  denies fever, chest pain but has  dyspnea and cough/phlegm   Respiratory Status Room air   Oral Cavity - Dentition Missing dentition   Treatment Methods Compensation strategy training;Patient/caregiver education   Type of PO's observed Thin liquids   Liquids provided via Cup   Oral Phase Signs & Symptoms --  none noted   Pharyngeal Phase Signs & Symptoms Delayed throat clear;Immediate throat clear  none noted   Type of cueing Verbal   Amount of cueing --  min-mod occasional faded to min rare   Other treatment/comments Long discussion and explanation about fresh fruit and vegetables due to patient reporting choking wiht watermelon. Pt req'd cues as indicated for double swallows.    Cognitive-Linquistic Treatment   Skilled Treatment SLP spent 7 minutes with pt and wife again discussing blood glucose meter and the need to monitor pt's blood sugar in the AMs. Pt's wife brought meter but instructions were in [place]. SLP strongly suggested stopping by PCP's office for tutorial re: how to use blood glucose meter.   Assessment / Recommendations / Plan   Plan Continue with current plan of care   Dysphagia Recommendations   Diet recommendations Regular;Thin liquid   Supervision Intermittent supervision to cue for compensatory strategies   Compensations Slow rate;Small sips/bites;Multiple dry swallows after each bite/sip   Postural Changes and/or Swallow Maneuvers Upright 30-60 min after meal  Progression Toward Goals   Progression toward goals Progressing toward goals          SLP Education - 01/27/16 0857    Education provided Yes   Education Details Swallow precautions, blood glucose meter education necessary at MD office   Person(s) Educated Patient;Spouse   Methods Explanation;Verbal cues   Comprehension Verbalized understanding;Returned demonstration;Verbal cues required;Need further instruction          SLP Short Term Goals - 12/30/15 0902    SLP SHORT TERM GOAL #1   Title pt will perform HEP for swallowing  with occasional min A   Time 1   Period Weeks   Status Achieved   SLP SHORT TERM GOAL #2   Title pt will demo swallow precuations with POS with rare min A   Time 1   Period Weeks   Status Achieved   SLP SHORT TERM GOAL #3   Title pt will state 3/4  memory compensations over 3 sessions   Time 1   Period Weeks   Status On-going          SLP Long Term Goals - 01/27/16 5686    SLP LONG TERM GOAL #1   Title pt will complete HEP for swallowing with rare min A   Status Deferred  due to successful modified   SLP LONG TERM GOAL #2   Title pt will tell SLP how using memory strategies could benefit him   Status Achieved   SLP LONG TERM GOAL #3   Title pt will demo swallowing precautions with POs with modified independence   Time 4   Period Weeks   Status On-going          Plan - 01/27/16 0920    Clinical Impression Statement Voice quality remains breathy. Pt/wife remain convinced pt's cognition unchanged since prior to latest admission (mid-June). Pt cont to require cues to use double swallow, independent with small bites/sips.. He would benfit from con't skilled ST for addressing pt's ability to monitor precautions as well as to cont to monitor memory skills and provide assistance with this PRN.   Speech Therapy Frequency 2x / week   Duration 4 weeks  3 weeks   Treatment/Interventions Aspiration precaution training;Pharyngeal strengthening exercises;Diet toleration management by SLP;Cognitive reorganization;SLP instruction and feedback;Compensatory strategies;Internal/external aids;Patient/family education;Functional tasks;Cueing hierarchy   Potential to Achieve Goals Fair   Potential Considerations Severity of impairments      Patient will benefit from skilled therapeutic intervention in order to improve the following deficits and impairments:   Dysphagia, pharyngeal phase  Cognitive communication deficit  Other voice and resonance disorders    Problem List Patient  Active Problem List   Diagnosis Date Noted  . Esophageal reflux   . Elevated troponin   . Cardiorenal syndrome   . HTN (hypertension), malignant   . Cerebrovascular accident (CVA) due to embolism of cerebral artery (HCC)   . Acute systolic heart failure (HCC)   . Acute on chronic combined systolic and diastolic heart failure (HCC)   . Pulmonary edema   . Encounter for feeding tube placement   . Hypertensive emergency 12/20/2015  . UTI (urinary tract infection) 12/14/2015  . Bacteremia, escherichia coli 12/14/2015  . Respiratory failure (HCC) 12/11/2015  . Acute respiratory failure with hypercapnia (HCC)   . Demand ischemia (HCC)   . Gait disturbance, post-stroke 12/09/2015  . Cognitive deficit, post-stroke 12/09/2015  . Numbness and tingling of both legs below knees 12/09/2015  . Long term (current) use of anticoagulants [  Z79.01] 11/01/2015  . Ischemic stroke of frontal lobe (HCC) 10/15/2015  . Anoxic encephalopathy (HCC) 10/15/2015  . Right-sided cerebrovascular accident (CVA) (HCC)   . Acute on chronic systolic congestive heart failure (HCC)   . HCAP (healthcare-associated pneumonia)   . Dysphagia as late effect of cerebrovascular disease   . Tobacco abuse   . Tachypnea   . Essential hypertension   . Type 2 diabetes mellitus with complication (HCC)   . Leukocytosis   . Absolute anemia   . Altered mental status   . Hemiplegia (HCC)   . Stroke (HCC)   . ARDS (adult respiratory distress syndrome) (HCC)   . Acute respiratory failure (HCC)   . STEMI (ST elevation myocardial infarction) (HCC) 09/28/2015  . Cardiac arrest (HCC)   . ST elevation myocardial infarction (STEMI) (HCC)   . Encounter for central line placement   . Hypokalemia   . Encephalopathy acute   . Acute hypoxemic respiratory failure (HCC)     Emitt Maglione ,MS, CCC-SLP  01/27/2016, 9:34 AM  [facility] [address] [place], [place],  [address] Phone: [phone]   Fax:  [phone]   Name: ZAYLON BOSSIER MRN: [id number] Date of Birth: [birth year]

## 2016-02-02 ENCOUNTER — Ambulatory Visit: Payer: BLUE CROSS/BLUE SHIELD

## 2016-02-02 ENCOUNTER — Ambulatory Visit: Payer: BLUE CROSS/BLUE SHIELD | Admitting: Physical Therapy

## 2016-02-02 VITALS — BP 163/81 | HR 77

## 2016-02-02 DIAGNOSIS — R2689 Other abnormalities of gait and mobility: Secondary | ICD-10-CM

## 2016-02-02 DIAGNOSIS — R41841 Cognitive communication deficit: Secondary | ICD-10-CM

## 2016-02-02 DIAGNOSIS — R1313 Dysphagia, pharyngeal phase: Secondary | ICD-10-CM

## 2016-02-02 DIAGNOSIS — R2681 Unsteadiness on feet: Secondary | ICD-10-CM

## 2016-02-02 DIAGNOSIS — I69354 Hemiplegia and hemiparesis following cerebral infarction affecting left non-dominant side: Secondary | ICD-10-CM

## 2016-02-02 NOTE — Patient Instructions (Signed)
Band Walk: Side Stepping    Tie band around legs, just above knees. Step to one side along counter.  Repeat other direction.  Do 4 times. Use red band.  Can place band at ankle or knees.  Do once a day.  http://plyo.exer.us/76   Copyright  VHI. All rights reserved.

## 2016-02-02 NOTE — Therapy (Signed)
[facility] [address] [place], [place], [address] Phone: [phone]   Fax:  [phone]  Speech Language Pathology Treatment  Patient Details  Name: Eddie [name] MRN: [id number] Date of Birth: 30-Jan-1951 Referring Provider: Claudette Laws, MD  Encounter Date: 02/02/2016      End of Session - 02/02/16 0937    Visit Number 10   Number of Visits 16   Date for SLP Re-Evaluation 02/22/16   Authorization - Visit Number 10   Authorization - Number of Visits 30   SLP Start Time 0847   SLP Stop Time  0931   SLP Time Calculation (min) 44 min   Activity Tolerance Patient tolerated treatment well      Past Medical History  Diagnosis Date  . PUD (peptic ulcer disease)   . CAD (coronary artery disease)     a. STEMI 09/2015 w/ DES to Prox LAD  . CVA (cerebral infarction)     a. 09/2015: acute small right frontal lobe infarct    Past Surgical History  Procedure Laterality Date  . Repair of peptic ulcer    . Cardiac catheterization N/A 09/28/2015    Procedure: Left Heart Cath and Coronary Angiography;  Surgeon: Runell Gess, MD;  Location: [facility] INVASIVE CV LAB;  Service: Cardiovascular;  Laterality: N/A;  . Cardiac catheterization N/A 10/08/2015    Procedure: Left Heart Cath and Coronary Angiography;  Surgeon: Lyn Records, MD;  Location: [facility] INVASIVE CV LAB;  Service: Cardiovascular;  Laterality: N/A;    There were no vitals filed for this visit.      Subjective Assessment - 02/02/16 0929    Subjective Pt reported having difficulty with watermelon and fresh fruits consistently. Church last night, pt coughed with grapes.   Currently in Pain? No/denies               ADULT SLP TREATMENT - 02/02/16 0925    General Information   Behavior/Cognition Alert;Cooperative;Pleasant mood   Treatment Provided   Treatment provided Dysphagia   Dysphagia Treatment   Temperature Spikes Noted No   Respiratory Status  Room air   Oral Cavity - Dentition Missing dentition   Treatment Methods Therapeutic exercise;Patient/caregiver education   Patient observed directly with PO's No   Type of cueing Verbal;Visual   Amount of cueing Moderate  occasionally   Other treatment/comments Given pt cont'd complaint of cough with fresh fruit and discusion last week about the same thing, SLP reinstituted pt's HEP for swallowing. Pt stated he began taking smaller bites of grape (1/2 grape each bite) when he began coughing, and did not have any more coughing when he did this. Pt req'd min-mod A occasionally, and wife stated pt completing HEP maybe once a day. SLP told pt to complete HEP x2/day. Pt also c/o phlegm at bedtime unable to clear - pt drinkinga bout 30 oz H2O daily. SLP suggested pt double his water intake and see if this helps.    Assessment / Recommendations / Plan   Plan Continue with current plan of care   Progression Toward Goals   Progression toward goals Progressing toward goals          SLP Education - 02/02/16 0936    Education provided Yes   Education Details Dysphagia HEP, drink mor eH2O (60 oz)   Person(s) Educated Patient   Methods Demonstration;Explanation;Tactile cues;Verbal cues   Comprehension Verbalized understanding;Returned demonstration;Need further instruction          SLP Short  Term Goals - 12/30/15 0902    SLP SHORT TERM GOAL #1   Title pt will perform HEP for swallowing with occasional min A   Time 1   Period Weeks   Status Achieved   SLP SHORT TERM GOAL #2   Title pt will demo swallow precuations with POS with rare min A   Time 1   Period Weeks   Status Achieved   SLP SHORT TERM GOAL #3   Title pt will state 3/4  memory compensations over 3 sessions   Time 1   Period Weeks   Status On-going          SLP Long Term Goals - 02/02/16 1610    SLP LONG TERM GOAL #1   Title pt will complete HEP for swallowing with rare min A   Status On-going  due to successful  modified   SLP LONG TERM GOAL #2   Title pt will tell SLP how using memory strategies could benefit him   Status Achieved   SLP LONG TERM GOAL #3   Title pt will demo swallowing precautions with POs with modified independence   Time 4   Period Weeks   Status On-going          Plan - 02/02/16 9604    Clinical Impression Statement SLP reinstated pt's HEP for swallowing given his S statements last two visits. Voice quality remains breathy. Pt/wife remain convinced pt's cognition unchanged since prior to latest admission (mid-June). Pt cont to require cues to use double swallow, independent with small bites/sips.. He would benfit from con't skilled ST for addressing pt's ability to monitor precautions as well as to cont to monitor memory skills and provide assistance with this PRN.   Speech Therapy Frequency 2x / week   Duration --  3 weeks   Treatment/Interventions Aspiration precaution training;Pharyngeal strengthening exercises;Diet toleration management by SLP;Cognitive reorganization;SLP instruction and feedback;Compensatory strategies;Internal/external aids;Patient/family education;Functional tasks;Cueing hierarchy   Potential to Achieve Goals Fair   Potential Considerations Severity of impairments      Patient will benefit from skilled therapeutic intervention in order to improve the following deficits and impairments:   Dysphagia, pharyngeal phase  Cognitive communication deficit    Problem List Patient Active Problem List   Diagnosis Date Noted  . Esophageal reflux   . Elevated troponin   . Cardiorenal syndrome   . HTN (hypertension), malignant   . Cerebrovascular accident (CVA) due to embolism of cerebral artery (HCC)   . Acute systolic heart failure (HCC)   . Acute on chronic combined systolic and diastolic heart failure (HCC)   . Pulmonary edema   . Encounter for feeding tube placement   . Hypertensive emergency 12/20/2015  . UTI (urinary tract infection)  12/14/2015  . Bacteremia, escherichia coli 12/14/2015  . Respiratory failure (HCC) 12/11/2015  . Acute respiratory failure with hypercapnia (HCC)   . Demand ischemia (HCC)   . Gait disturbance, post-stroke 12/09/2015  . Cognitive deficit, post-stroke 12/09/2015  . Numbness and tingling of both legs below knees 12/09/2015  . Long term (current) use of anticoagulants [Z79.01] 11/01/2015  . Ischemic stroke of frontal lobe (HCC) 10/15/2015  . Anoxic encephalopathy (HCC) 10/15/2015  . Right-sided cerebrovascular accident (CVA) (HCC)   . Acute on chronic systolic congestive heart failure (HCC)   . HCAP (healthcare-associated pneumonia)   . Dysphagia as late effect of cerebrovascular disease   . Tobacco abuse   . Tachypnea   . Essential hypertension   . Type 2  diabetes mellitus with complication (HCC)   . Leukocytosis   . Absolute anemia   . Altered mental status   . Hemiplegia (HCC)   . Stroke (HCC)   . ARDS (adult respiratory distress syndrome) (HCC)   . Acute respiratory failure (HCC)   . STEMI (ST elevation myocardial infarction) (HCC) 09/28/2015  . Cardiac arrest (HCC)   . ST elevation myocardial infarction (STEMI) (HCC)   . Encounter for central line placement   . Hypokalemia   . Encephalopathy acute   . Acute hypoxemic respiratory failure (HCC)     Analysse Quinonez ,MS, CCC-SLP  02/02/2016, 9:38 AM  [facility] [address] [place], [place], [address] Phone: [phone]   Fax:  [phone]   Name: Eddie [name] MRN: [id number] Date of Birth: May 27, 1951

## 2016-02-02 NOTE — Therapy (Signed)
[facility] [address] [place], [place], [address] Phone: [phone]   Fax:  [phone]  Physical Therapy Treatment  Patient Details  Name: Eddie [name] MRN: [id number] Date of Birth: 14-Apr-1951 Referring Provider: Dr. [name]  Encounter Date: 02/02/2016      PT End of Session - 02/02/16 0914    Visit Number 9   Number of Visits 20   Date for PT Re-Evaluation 03/03/16   Authorization Type [employer]-30 visit combo limit   Authorization - Visit Number 9   Authorization - Number of Visits 14  PT to use 14/30;30 visit limit split with ST   PT Start Time 0805   PT Stop Time 0847   PT Time Calculation (min) 42 min   Activity Tolerance Patient tolerated treatment well   Behavior During Therapy [facility] for tasks assessed/performed      Past Medical History  Diagnosis Date  . PUD (peptic ulcer disease)   . CAD (coronary artery disease)     a. STEMI 09/2015 w/ DES to Prox LAD  . CVA (cerebral infarction)     a. 09/2015: acute small right frontal lobe infarct    Past Surgical History  Procedure Laterality Date  . Repair of peptic ulcer    . Cardiac catheterization N/A 09/28/2015    Procedure: Left Heart Cath and Coronary Angiography;  Surgeon: Runell Gess, MD;  Location: [facility] INVASIVE CV LAB;  Service: Cardiovascular;  Laterality: N/A;  . Cardiac catheterization N/A 10/08/2015    Procedure: Left Heart Cath and Coronary Angiography;  Surgeon: Lyn Records, MD;  Location: [facility] INVASIVE CV LAB;  Service: Cardiovascular;  Laterality: N/A;    Filed Vitals:   02/02/16 0700 02/02/16 0824  BP: 147/89 163/81  Pulse: 73 77        Subjective Assessment - 02/02/16 0910    Subjective Denies falls or changes.  "Feeling good".   Patient is accompained by: Family member  wife   Pertinent History MI, HTN, DM, CAD, L eye blindness   Patient Stated Goals Walk without cane, wash car without losing balance   Currently in Pain?  No/denies            [facility] Adult PT Treatment/Exercise - 02/02/16 0001    Knee/Hip Exercises: Stretches   Gastroc Stretch Both;2 reps;30 seconds;Other (comment)  off edge of step x 2 and using knee press x 30 seconds x 2   Knee/Hip Exercises: Machines for Strengthening   Cybex Leg Press bil LE x 95# x 10, 105# x 10, 115# x 10;bil ankle dorsiflexion with 40# x 10 x 2 sets   Knee/Hip Exercises: Standing   Forward Step Up Both;1 set;15 reps;Hand Hold: 1;Step Height: 8"   Wall Squat 3 sets;Other (comment)  20 sec x 2, 25 sec x 1             Balance Exercises - 02/02/16 0911    Balance Exercises: Standing   Tandem Stance Eyes open;Upper extremity support 1;3 reps;10 secs   Tandem Gait Forward;Upper extremity support;4 reps;Other (comment)  in parallel bars 8' x 4 working on decreasing UE support           PT Education - 02/02/16 0913    Education provided Yes   Education Details Addition to HEP, scheduling more appointments per POC   Person(s) Educated Patient;Spouse   Methods Explanation;Demonstration;Verbal cues;Handout   Comprehension Verbalized understanding;Returned demonstration          PT Short Term  Goals - 01/19/16 1647            PT Long Term Goals - 01/19/16 1640    PT LONG TERM GOAL #1   Title Pt will be IND in HEP to improve balance and strength. (New Target date: 03/03/16)   Baseline All unmet goals will be carried over to new POC: 01/27/16   Status Revised   PT LONG TERM GOAL #2   Title Pt will improve FGA score to >/=25/30 to decr. falls risk. (New Target date: 03/03/16)   Baseline All unmet goals will be carried over to new POC: 01/27/16   Status Revised   PT LONG TERM GOAL #3   Title Pt will amb. 300' modified independent with LRAD, over uneven terrain (sand, grass), in order to improve functional mobility and to go to the beach this summer/fall. (New Target date: 03/03/16)   Baseline All unmet goals will be carried over to new POC: 01/27/16    Status Revised   PT LONG TERM GOAL #4   Title Pt will amb. 800' over paved surfaces (even) modified independent, no AD, in order to walk dog and improve functional mobility. (New Target date: 03/03/16)   Baseline All unmet goals will be carried over to new POC: 01/27/16   Status Revised   PT LONG TERM GOAL #5   Title Pt will verbalize walking program and potentially how to progress to light jogging based on progress, in order to walk/jog with dog. Target date: 11/30/15   Baseline All unmet goals will be carried over to new POC: 01/27/16   Status Deferred               Plan - 02/02/16 0915    Clinical Impression Statement Pt continues with difficulty with SLS, Tandem stance and tandem gait.  Progressing toward goals.  Continue PT per POC.   Rehab Potential Good   Clinical Impairments Affecting Rehab Potential co-morbidities   PT Frequency 2x / week   PT Duration 6 weeks  PT requesting additional 2x/week for 4 weeks   PT Treatment/Interventions ADLs/Self Care Home Management;Biofeedback;Canalith Repostioning;Therapeutic activities;Therapeutic exercise;Balance training;Manual techniques;Vestibular;Orthotic Fit/Training;Functional mobility training;Stair training;Gait training;DME Instruction;Patient/family education;Cognitive remediation   PT Next Visit Plan Continue to monitor BP & oxygen levels during session.  Balance/gait on compliant surfaces.  Gait outdoors if weather permits.   PT Home Exercise Plan Strengthening, balance, and flexibility HEP   Consulted and Agree with Plan of Care Patient   Family Member Consulted pt's wife: [name]      Patient will benefit from skilled therapeutic intervention in order to improve the following deficits and impairments:  Abnormal gait, Decreased endurance, Decreased knowledge of use of DME, Decreased strength, Impaired flexibility, Decreased cognition, Decreased balance, Decreased mobility, Decreased activity tolerance  Visit Diagnosis: Other  abnormalities of gait and mobility  Hemiplegia and hemiparesis following cerebral infarction affecting left non-dominant side (HCC)  Unsteadiness on feet     Problem List Patient Active Problem List   Diagnosis Date Noted  . Esophageal reflux   . Elevated troponin   . Cardiorenal syndrome   . HTN (hypertension), malignant   . Cerebrovascular accident (CVA) due to embolism of cerebral artery (HCC)   . Acute systolic heart failure (HCC)   . Acute on chronic combined systolic and diastolic heart failure (HCC)   . Pulmonary edema   . Encounter for feeding tube placement   . Hypertensive emergency 12/20/2015  . UTI (urinary tract infection) 12/14/2015  . Bacteremia, escherichia coli  12/14/2015  . Respiratory failure (HCC) 12/11/2015  . Acute respiratory failure with hypercapnia (HCC)   . Demand ischemia (HCC)   . Gait disturbance, post-stroke 12/09/2015  . Cognitive deficit, post-stroke 12/09/2015  . Numbness and tingling of both legs below knees 12/09/2015  . Long term (current) use of anticoagulants [Z79.01] 11/01/2015  . Ischemic stroke of frontal lobe (HCC) 10/15/2015  . Anoxic encephalopathy (HCC) 10/15/2015  . Right-sided cerebrovascular accident (CVA) (HCC)   . Acute on chronic systolic congestive heart failure (HCC)   . HCAP (healthcare-associated pneumonia)   . Dysphagia as late effect of cerebrovascular disease   . Tobacco abuse   . Tachypnea   . Essential hypertension   . Type 2 diabetes mellitus with complication (HCC)   . Leukocytosis   . Absolute anemia   . Altered mental status   . Hemiplegia (HCC)   . Stroke (HCC)   . ARDS (adult respiratory distress syndrome) (HCC)   . Acute respiratory failure (HCC)   . STEMI (ST elevation myocardial infarction) (HCC) 09/28/2015  . Cardiac arrest (HCC)   . ST elevation myocardial infarction (STEMI) (HCC)   . Encounter for central line placement   . Hypokalemia   . Encephalopathy acute   . Acute hypoxemic respiratory  failure (HCC)     Newell Coral 02/02/2016, 9:19 AM  [facility] [address] [place], [place], [address] Phone: [phone]   Fax:  [phone]  Name: Eddie [name] MRN: [id number] Date of Birth: Mar 09, 1951    Newell Coral, PTA [facility] 02/02/2016 9:20 AM Phone: [phone] Fax: [phone]

## 2016-02-04 ENCOUNTER — Ambulatory Visit: Payer: BLUE CROSS/BLUE SHIELD

## 2016-02-04 VITALS — BP 143/92 | HR 70

## 2016-02-04 DIAGNOSIS — R2689 Other abnormalities of gait and mobility: Secondary | ICD-10-CM

## 2016-02-04 DIAGNOSIS — R1313 Dysphagia, pharyngeal phase: Secondary | ICD-10-CM

## 2016-02-04 NOTE — Patient Instructions (Signed)
Take your blood glucose readings as you have been told.

## 2016-02-04 NOTE — Therapy (Signed)
[facility] [address] [place], [place], [address] Phone: [phone]   Fax:  [phone]  Physical Therapy Treatment  Patient Details  Name: Eddie [name] MRN: [id number] Date of Birth: 01-10-1951 Referring Provider: Dr. [name]  Encounter Date: 02/04/2016      PT End of Session - 02/04/16 1143    Visit Number 10   Number of Visits 20   Date for PT Re-Evaluation 03/03/16   Authorization Type [employer]-30 visit combo limit   Authorization - Visit Number 10   Authorization - Number of Visits 14   PT Start Time 0805  pt arrived late   PT Stop Time 0843   PT Time Calculation (min) 38 min   Activity Tolerance Patient tolerated treatment well   Behavior During Therapy [facility] for tasks assessed/performed      Past Medical History  Diagnosis Date  . PUD (peptic ulcer disease)   . CAD (coronary artery disease)     a. STEMI 09/2015 w/ DES to Prox LAD  . CVA (cerebral infarction)     a. 09/2015: acute small right frontal lobe infarct    Past Surgical History  Procedure Laterality Date  . Repair of peptic ulcer    . Cardiac catheterization N/A 09/28/2015    Procedure: Left Heart Cath and Coronary Angiography;  Surgeon: Runell Gess, MD;  Location: [facility] INVASIVE CV LAB;  Service: Cardiovascular;  Laterality: N/A;  . Cardiac catheterization N/A 10/08/2015    Procedure: Left Heart Cath and Coronary Angiography;  Surgeon: Lyn Records, MD;  Location: [facility] INVASIVE CV LAB;  Service: Cardiovascular;  Laterality: N/A;    Filed Vitals:   02/04/16 0810 02/04/16 0818 02/04/16 0824  BP: 157/94 172/97 143/92  Pulse: 72 82 70  SpO2: 99% 98%         Subjective Assessment - 02/04/16 0808    Subjective Pt denied falls or changes since last visit. Pt reported he went to the eye MD and reported he gave pt eye drops to use but he's not sure of the name and he didn't bring it in. Pt stated the drops are to decr. pressure in eyes. Pt  reported he took BP meds this morning.   Patient is accompained by: Family member  wife-[name]   Pertinent History MI, HTN, DM, CAD, L eye blindness   Patient Stated Goals Walk without cane, wash car without losing balance   Currently in Pain? No/denies                         [facility] Adult PT Treatment/Exercise - 02/04/16 0813    Ambulation/Gait   Ambulation/Gait Yes   Ambulation/Gait Assistance 5: Supervision;4: Min guard   Ambulation/Gait Assistance Details S over paved and even terrain and min guard during amb. over grassy terrain 2/2 incr. postural sway. Cues for improved heel strike, arm swing, and to decr. narrow BOS. No c/o SOB or HA after amb.   Ambulation Distance (Feet) 500 Feet  outdoors and 50'x2 and 117' indoors   Assistive device None   Gait Pattern Step-through pattern;Decreased stride length;Right foot flat;Left foot flat;Trendelenburg;Narrow base of support;Decreased arm swing - left   Ambulation Surface Level;Unlevel;Indoor;Outdoor;Paved;Grass   High Level Balance   High Level Balance Activities Backward walking;Head turns;Marching forwards;Other (comment)  and forward amb.   High Level Balance Comments Performed over compliant surfaces: 4x15'/activity, with S to min A to maintain balance. Cues for technique. Pt also performed cone  taps (single and double) to improve SLS 2x5 cones/LE/activity. Cues to improve lateral. wt. shifting and technique.                PT Education - 02/04/16 1143    Education provided Yes   Education Details PT discussed the importance of assssing BP at home, as it incr. during therapy session.   Person(s) Educated Patient;Spouse   Methods Explanation   Comprehension Verbalized understanding          PT Short Term Goals - 01/19/16 1647            PT Long Term Goals - 01/19/16 1640    PT LONG TERM GOAL #1   Title Pt will be IND in HEP to improve balance and strength. (New Target date: 03/03/16)   Baseline  All unmet goals will be carried over to new POC: 01/27/16   Status Revised   PT LONG TERM GOAL #2   Title Pt will improve FGA score to >/=25/30 to decr. falls risk. (New Target date: 03/03/16)   Baseline All unmet goals will be carried over to new POC: 01/27/16   Status Revised   PT LONG TERM GOAL #3   Title Pt will amb. 300' modified independent with LRAD, over uneven terrain (sand, grass), in order to improve functional mobility and to go to the beach this summer/fall. (New Target date: 03/03/16)   Baseline All unmet goals will be carried over to new POC: 01/27/16   Status Revised   PT LONG TERM GOAL #4   Title Pt will amb. 800' over paved surfaces (even) modified independent, no AD, in order to walk dog and improve functional mobility. (New Target date: 03/03/16)   Baseline All unmet goals will be carried over to new POC: 01/27/16   Status Revised   PT LONG TERM GOAL #5   Title Pt will verbalize walking program and potentially how to progress to light jogging based on progress, in order to walk/jog with dog. Target date: 11/30/15   Baseline All unmet goals will be carried over to new POC: 01/27/16   Status Deferred               Plan - 02/04/16 1144    Clinical Impression Statement Pt required seated rest breaks to allow BP to decr. after amb., in order to stay within the parameters set by MD (160/100). Pt reported no weakness, HA, confusion, or N/T during session. Pt continues to experience incr. postural sway and LOB during SLS activities and would continue to benefit from skilled PT to improve functional mobility.    Rehab Potential Good   Clinical Impairments Affecting Rehab Potential co-morbidities   PT Frequency 2x / week   PT Duration 6 weeks  PT requesting additional 2x/week for 4 weeks   PT Treatment/Interventions ADLs/Self Care Home Management;Biofeedback;Canalith Repostioning;Therapeutic activities;Therapeutic exercise;Balance training;Manual techniques;Vestibular;Orthotic  Fit/Training;Functional mobility training;Stair training;Gait training;DME Instruction;Patient/family education;Cognitive remediation   PT Next Visit Plan Continue to monitor BP & oxygen levels during session.  Balance/gait on compliant surfaces.  Gait outdoors if weather permits.   PT Home Exercise Plan Strengthening, balance, and flexibility HEP   Consulted and Agree with Plan of Care Patient   Family Member Consulted pt's wife: [name]      Patient will benefit from skilled therapeutic intervention in order to improve the following deficits and impairments:  Abnormal gait, Decreased endurance, Decreased knowledge of use of DME, Decreased strength, Impaired flexibility, Decreased cognition, Decreased balance, Decreased mobility, Decreased activity  tolerance  Visit Diagnosis: Other abnormalities of gait and mobility     Problem List Patient Active Problem List   Diagnosis Date Noted  . Esophageal reflux   . Elevated troponin   . Cardiorenal syndrome   . HTN (hypertension), malignant   . Cerebrovascular accident (CVA) due to embolism of cerebral artery (HCC)   . Acute systolic heart failure (HCC)   . Acute on chronic combined systolic and diastolic heart failure (HCC)   . Pulmonary edema   . Encounter for feeding tube placement   . Hypertensive emergency 12/20/2015  . UTI (urinary tract infection) 12/14/2015  . Bacteremia, escherichia coli 12/14/2015  . Respiratory failure (HCC) 12/11/2015  . Acute respiratory failure with hypercapnia (HCC)   . Demand ischemia (HCC)   . Gait disturbance, post-stroke 12/09/2015  . Cognitive deficit, post-stroke 12/09/2015  . Numbness and tingling of both legs below knees 12/09/2015  . Long term (current) use of anticoagulants [Z79.01] 11/01/2015  . Ischemic stroke of frontal lobe (HCC) 10/15/2015  . Anoxic encephalopathy (HCC) 10/15/2015  . Right-sided cerebrovascular accident (CVA) (HCC)   . Acute on chronic systolic congestive heart failure  (HCC)   . HCAP (healthcare-associated pneumonia)   . Dysphagia as late effect of cerebrovascular disease   . Tobacco abuse   . Tachypnea   . Essential hypertension   . Type 2 diabetes mellitus with complication (HCC)   . Leukocytosis   . Absolute anemia   . Altered mental status   . Hemiplegia (HCC)   . Stroke (HCC)   . ARDS (adult respiratory distress syndrome) (HCC)   . Acute respiratory failure (HCC)   . STEMI (ST elevation myocardial infarction) (HCC) 09/28/2015  . Cardiac arrest (HCC)   . ST elevation myocardial infarction (STEMI) (HCC)   . Encounter for central line placement   . Hypokalemia   . Encephalopathy acute   . Acute hypoxemic respiratory failure (HCC)     Odis Wickey L 02/04/2016, 11:46 AM   [facility] [address] [place], [place], [address] Phone: [phone]   Fax:  [phone]  Name: AUDON MOHNEY MRN: [id number] Date of Birth: 1950/11/07    Zerita Boers, PT,DPT 02/04/2016 11:46 AM Phone: [phone] Fax: [phone]

## 2016-02-04 NOTE — Therapy (Signed)
[facility] [address] [place], [place], [address] Phone: [phone]   Fax:  [phone]  Speech Language Pathology Treatment  Patient Details  Name: Eddie [name] MRN: [id number] Date of Birth: January 14, 1951 Referring Provider: Claudette Laws, MD  Encounter Date: 02/04/2016      End of Session - 02/04/16 1701    Visit Number 11   Number of Visits 16   Date for SLP Re-Evaluation 02/22/16   Authorization - Visit Number 11   Authorization - Number of Visits 30   SLP Start Time 0851   SLP Stop Time  0931   SLP Time Calculation (min) 40 min   Activity Tolerance Patient tolerated treatment well      Past Medical History  Diagnosis Date  . PUD (peptic ulcer disease)   . CAD (coronary artery disease)     a. STEMI 09/2015 w/ DES to Prox LAD  . CVA (cerebral infarction)     a. 09/2015: acute small right frontal lobe infarct    Past Surgical History  Procedure Laterality Date  . Repair of peptic ulcer    . Cardiac catheterization N/A 09/28/2015    Procedure: Left Heart Cath and Coronary Angiography;  Surgeon: Runell Gess, MD;  Location: [facility] INVASIVE CV LAB;  Service: Cardiovascular;  Laterality: N/A;  . Cardiac catheterization N/A 10/08/2015    Procedure: Left Heart Cath and Coronary Angiography;  Surgeon: Lyn Records, MD;  Location: [facility] INVASIVE CV LAB;  Service: Cardiovascular;  Laterality: N/A;    There were no vitals filed for this visit.      Subjective Assessment - 02/04/16 0856    Subjective Pt reports he has not had any problems with coughing since last session. Wife agrees.   Patient is accompained by: Family member  wife, [name]   Currently in Pain? No/denies               ADULT SLP TREATMENT - 02/04/16 0901    General Information   Behavior/Cognition Alert;Cooperative;Pleasant mood   Treatment Provided   Treatment provided Dysphagia   Dysphagia Treatment   Temperature Spikes Noted  No   Oral Cavity - Dentition Missing dentition   Other treatment/comments Long discussion about s/s aspiration PNA - SLP explained each one to pt/wife. With HEP today pt req'd min A occasionally for correct completion of HEP.    Cognitive-Linquistic Treatment   Skilled Treatment Pt and wife had not been by pt's PCP to receive directions for blood glucose meter in English. Pt has still, therefore, not had blood glucose checked. SLP searched internet for instructions for pt's monitor, in English, and printed them off for wife.   Assessment / Recommendations / Plan   Plan Continue with current plan of care   Dysphagia Recommendations   Diet recommendations Regular;Thin liquid   Supervision Intermittent supervision to cue for compensatory strategies   Compensations Slow rate;Small sips/bites;Multiple dry swallows after each bite/sip   Postural Changes and/or Swallow Maneuvers Upright 30-60 min after meal   Progression Toward Goals   Progression toward goals Progressing toward goals          SLP Education - 02/04/16 1701    Education provided Yes   Education Details HEP   Person(s) Educated Patient;Spouse   Methods Explanation;Demonstration   Comprehension Verbalized understanding;Returned demonstration;Verbal cues required          SLP Short Term Goals - 12/30/15 0902    SLP SHORT TERM GOAL #1   Title  pt will perform HEP for swallowing with occasional min A   Time 1   Period Weeks   Status Achieved   SLP SHORT TERM GOAL #2   Title pt will demo swallow precuations with POS with rare min A   Time 1   Period Weeks   Status Achieved   SLP SHORT TERM GOAL #3   Title pt will state 3/4  memory compensations over 3 sessions   Time 1   Period Weeks   Status On-going          SLP Long Term Goals - 02/04/16 1702    SLP LONG TERM GOAL #1   Title pt will complete HEP for swallowing with rare min A   Time Period 2  weeks   Status On-going     SLP LONG TERM GOAL #2   Title pt  will tell SLP how using memory strategies could benefit him   Status Achieved   SLP LONG TERM GOAL #3   Title pt will demo swallowing precautions with POs with modified independence   Time 2   Period Weeks   Status On-going          Plan - 02/04/16 1701    Clinical Impression Statement SLP reinstated pt's HEP for swallowing given his S statements last two visits. Voice quality remains breathy.  He would benfit from con't skilled ST for addressing pt's ability to monitor precautions as well as to cont to monitor memory skills and provide assistance with this PRN.   Speech Therapy Frequency 2x / week   Duration --  3 weeks   Treatment/Interventions Aspiration precaution training;Pharyngeal strengthening exercises;Diet toleration management by SLP;Cognitive reorganization;SLP instruction and feedback;Compensatory strategies;Internal/external aids;Patient/family education;Functional tasks;Cueing hierarchy   Potential to Achieve Goals Fair   Potential Considerations Severity of impairments      Patient will benefit from skilled therapeutic intervention in order to improve the following deficits and impairments:   Dysphagia, pharyngeal phase    Problem List Patient Active Problem List   Diagnosis Date Noted  . Esophageal reflux   . Elevated troponin   . Cardiorenal syndrome   . HTN (hypertension), malignant   . Cerebrovascular accident (CVA) due to embolism of cerebral artery (HCC)   . Acute systolic heart failure (HCC)   . Acute on chronic combined systolic and diastolic heart failure (HCC)   . Pulmonary edema   . Encounter for feeding tube placement   . Hypertensive emergency 12/20/2015  . UTI (urinary tract infection) 12/14/2015  . Bacteremia, escherichia coli 12/14/2015  . Respiratory failure (HCC) 12/11/2015  . Acute respiratory failure with hypercapnia (HCC)   . Demand ischemia (HCC)   . Gait disturbance, post-stroke 12/09/2015  . Cognitive deficit, post-stroke  12/09/2015  . Numbness and tingling of both legs below knees 12/09/2015  . Long term (current) use of anticoagulants [Z79.01] 11/01/2015  . Ischemic stroke of frontal lobe (HCC) 10/15/2015  . Anoxic encephalopathy (HCC) 10/15/2015  . Right-sided cerebrovascular accident (CVA) (HCC)   . Acute on chronic systolic congestive heart failure (HCC)   . HCAP (healthcare-associated pneumonia)   . Dysphagia as late effect of cerebrovascular disease   . Tobacco abuse   . Tachypnea   . Essential hypertension   . Type 2 diabetes mellitus with complication (HCC)   . Leukocytosis   . Absolute anemia   . Altered mental status   . Hemiplegia (HCC)   . Stroke (HCC)   . ARDS (adult respiratory distress syndrome) (HCC)   .  Acute respiratory failure (HCC)   . STEMI (ST elevation myocardial infarction) (HCC) 09/28/2015  . Cardiac arrest (HCC)   . ST elevation myocardial infarction (STEMI) (HCC)   . Encounter for central line placement   . Hypokalemia   . Encephalopathy acute   . Acute hypoxemic respiratory failure (HCC)     Ytzel Gubler ,MS, CCC-SLP  02/04/2016, 5:04 PM  [facility] [address] [place], [place], [address] Phone: [phone]   Fax:  [phone]   Name: MIGEL CLAFFEY MRN: [id number] Date of Birth: 1951/06/12

## 2016-02-09 ENCOUNTER — Ambulatory Visit: Payer: BLUE CROSS/BLUE SHIELD

## 2016-02-09 ENCOUNTER — Ambulatory Visit: Payer: BLUE CROSS/BLUE SHIELD | Admitting: Physical Therapy

## 2016-02-09 VITALS — BP 179/102 | HR 64

## 2016-02-09 DIAGNOSIS — R2689 Other abnormalities of gait and mobility: Secondary | ICD-10-CM

## 2016-02-09 DIAGNOSIS — R1313 Dysphagia, pharyngeal phase: Secondary | ICD-10-CM

## 2016-02-09 DIAGNOSIS — R41841 Cognitive communication deficit: Secondary | ICD-10-CM

## 2016-02-09 NOTE — Therapy (Signed)
[facility] [address] [place], [place], [address] Phone: [phone]   Fax:  [phone]  Physical Therapy Treatment  Patient Details  Name: Eddie [name] MRN: [id number] Date of Birth: 1950-10-21 Referring Provider: Dr. [name]  Encounter Date: 02/09/2016    Past Medical History  Diagnosis Date  . PUD (peptic ulcer disease)   . CAD (coronary artery disease)     a. STEMI 09/2015 w/ DES to Prox LAD  . CVA (cerebral infarction)     a. 09/2015: acute small right frontal lobe infarct    Past Surgical History  Procedure Laterality Date  . Repair of peptic ulcer    . Cardiac catheterization N/A 09/28/2015    Procedure: Left Heart Cath and Coronary Angiography;  Surgeon: Runell Gess, MD;  Location: [facility] INVASIVE CV LAB;  Service: Cardiovascular;  Laterality: N/A;  . Cardiac catheterization N/A 10/08/2015    Procedure: Left Heart Cath and Coronary Angiography;  Surgeon: Lyn Records, MD;  Location: [facility] INVASIVE CV LAB;  Service: Cardiovascular;  Laterality: N/A;    Filed Vitals:   02/09/16 1100 02/09/16 1116 02/09/16 1125  BP: 179/107 169/98 179/102  Pulse: 71 62 64        Subjective Assessment - 02/09/16 1113    Subjective Pt reports being upset yesterday about an issue with a neightbor and wonders if that is why his BP is up this morning.  Took meds but did not check his BP before taking meds.   Patient is accompained by: Family member  wife-[name]   Pertinent History MI, HTN, DM, CAD, L eye blindness   Patient Stated Goals Walk without cane, wash car without losing balance   Currently in Pain? No/denies     Pt with increased BP today at beginning of session then checked again 2 additional times at rest.  Educated pt and wife on importance of checking BP at home and documenting readings to give feedback to MD.    .No treatment performed today due to increased BP.  Pt denies dizziness, weakness or  headache.        PT Education - 02/09/16 1138    Education provided Yes   Education Details Taking BP in am before meds, 30 minutes after meds then 2 more times during the day due to fluctuations in readings and MD appointment on Friday;holding PT for today due to increased BP   Person(s) Educated Patient;Spouse   Methods Explanation   Comprehension Verbalized understanding          PT Short Term Goals - 01/19/16 1647            PT Long Term Goals - 01/19/16 1640    PT LONG TERM GOAL #1   Title Pt will be IND in HEP to improve balance and strength. (New Target date: 03/03/16)   Baseline All unmet goals will be carried over to new POC: 01/27/16   Status Revised   PT LONG TERM GOAL #2   Title Pt will improve FGA score to >/=25/30 to decr. falls risk. (New Target date: 03/03/16)   Baseline All unmet goals will be carried over to new POC: 01/27/16   Status Revised   PT LONG TERM GOAL #3   Title Pt will amb. 300' modified independent with LRAD, over uneven terrain (sand, grass), in order to improve functional mobility and to go to the beach this summer/fall. (New Target date: 03/03/16)   Baseline All unmet goals will be carried over  to new POC: 01/27/16   Status Revised   PT LONG TERM GOAL #4   Title Pt will amb. 800' over paved surfaces (even) modified independent, no AD, in order to walk dog and improve functional mobility. (New Target date: 03/03/16)   Baseline All unmet goals will be carried over to new POC: 01/27/16   Status Revised   PT LONG TERM GOAL #5   Title Pt will verbalize walking program and potentially how to progress to light jogging based on progress, in order to walk/jog with dog. Target date: 11/30/15   Baseline All unmet goals will be carried over to new POC: 01/27/16   Status Deferred             Patient will benefit from skilled therapeutic intervention in order to improve the following deficits and impairments:     Visit Diagnosis: Other abnormalities  of gait and mobility     Problem List Patient Active Problem List   Diagnosis Date Noted  . Esophageal reflux   . Elevated troponin   . Cardiorenal syndrome   . HTN (hypertension), malignant   . Cerebrovascular accident (CVA) due to embolism of cerebral artery (HCC)   . Acute systolic heart failure (HCC)   . Acute on chronic combined systolic and diastolic heart failure (HCC)   . Pulmonary edema   . Encounter for feeding tube placement   . Hypertensive emergency 12/20/2015  . UTI (urinary tract infection) 12/14/2015  . Bacteremia, escherichia coli 12/14/2015  . Respiratory failure (HCC) 12/11/2015  . Acute respiratory failure with hypercapnia (HCC)   . Demand ischemia (HCC)   . Gait disturbance, post-stroke 12/09/2015  . Cognitive deficit, post-stroke 12/09/2015  . Numbness and tingling of both legs below knees 12/09/2015  . Long term (current) use of anticoagulants [Z79.01] 11/01/2015  . Ischemic stroke of frontal lobe (HCC) 10/15/2015  . Anoxic encephalopathy (HCC) 10/15/2015  . Right-sided cerebrovascular accident (CVA) (HCC)   . Acute on chronic systolic congestive heart failure (HCC)   . HCAP (healthcare-associated pneumonia)   . Dysphagia as late effect of cerebrovascular disease   . Tobacco abuse   . Tachypnea   . Essential hypertension   . Type 2 diabetes mellitus with complication (HCC)   . Leukocytosis   . Absolute anemia   . Altered mental status   . Hemiplegia (HCC)   . Stroke (HCC)   . ARDS (adult respiratory distress syndrome) (HCC)   . Acute respiratory failure (HCC)   . STEMI (ST elevation myocardial infarction) (HCC) 09/28/2015  . Cardiac arrest (HCC)   . ST elevation myocardial infarction (STEMI) (HCC)   . Encounter for central line placement   . Hypokalemia   . Encephalopathy acute   . Acute hypoxemic respiratory failure (HCC)     Newell Coral 02/09/2016, 11:40 AM  [facility] [address] [place], [place], [address] Phone: [phone]   Fax:  [phone]  Name: Eddie [name] MRN: [id number] Date of Birth: 1950/11/13    Newell Coral, [place] [facility] 02/09/2016 11:41 AM Phone: [phone] Fax: [phone]

## 2016-02-09 NOTE — Therapy (Signed)
[facility] [address] [place], [place], [address] Phone: [phone]   Fax:  [phone]  Speech Language Pathology Treatment  Patient Details  Name: Eddie [name] MRN: [id number] Date of Birth: September 17, 1950 Referring Provider: Claudette Laws, MD  Encounter Date: 02/09/2016      End of Session - 02/09/16 0933    Visit Number 12   Number of Visits 16   Date for SLP Re-Evaluation 02/22/16   Authorization - Visit Number 12   Authorization - Number of Visits 30   SLP Start Time 0851   SLP Stop Time  0932   SLP Time Calculation (min) 41 min   Activity Tolerance Patient tolerated treatment well      Past Medical History  Diagnosis Date  . PUD (peptic ulcer disease)   . CAD (coronary artery disease)     a. STEMI 09/2015 w/ DES to Prox LAD  . CVA (cerebral infarction)     a. 09/2015: acute small right frontal lobe infarct    Past Surgical History  Procedure Laterality Date  . Repair of peptic ulcer    . Cardiac catheterization N/A 09/28/2015    Procedure: Left Heart Cath and Coronary Angiography;  Surgeon: Runell Gess, MD;  Location: [facility] INVASIVE CV LAB;  Service: Cardiovascular;  Laterality: N/A;  . Cardiac catheterization N/A 10/08/2015    Procedure: Left Heart Cath and Coronary Angiography;  Surgeon: Lyn Records, MD;  Location: [facility] INVASIVE CV LAB;  Service: Cardiovascular;  Laterality: N/A;    There were no vitals filed for this visit.      Subjective Assessment - 02/09/16 0903    Subjective Pt reports he has not had any problems with coughing since last session. Wife agrees.   Patient is accompained by: Family member  wife               ADULT SLP TREATMENT - 02/09/16 0851    General Information   Behavior/Cognition Alert;Cooperative;Pleasant mood   Treatment Provided   Treatment provided Dysphagia   Dysphagia Treatment   Temperature Spikes Noted No   Oral Cavity - Dentition Missing  dentition   Treatment Methods Therapeutic exercise;Patient/caregiver education   Patient observed directly with PO's Yes   Type of PO's observed Thin liquids;Dysphagia 3 (soft)   Other treatment/comments Pt eating cereal but wife and pt agree pt does not cough/throat clear with cereal. Pt had H2Omelon last night and coughed occasionally with that (wife states pt had too large of bites). SLP encouraged pt to take smaller bites with fresh fruit. Pt with dys III/thin liquids during session today without overt s/s aspiration. SLP needed to provide mod A usually for POs. Pt reports he is completing HEP x3-4/day with approx 5 reps each time. SLP suggested 10 reps of each exercise except Shaker and chin push/resist. Pt with mildly breathy voice but pt/wife deny other overt s/s aspiration PNA. Pt did not take blood sugar reading this morning due to going to heart MD.    Pain Assessment   Pain Assessment No/denies pain   Assessment / Recommendations / Plan   Plan Continue with current plan of care   Dysphagia Recommendations   Diet recommendations Regular;Thin liquid   Supervision Intermittent supervision to cue for compensatory strategies   Compensations Slow rate;Small sips/bites;Multiple dry swallows after each bite/sip   Postural Changes and/or Swallow Maneuvers Upright 30-60 min after meal   Progression Toward Goals   Progression toward goals --  pt encouraged  to complete HEP with wife near, to correct PRN          SLP Education - 02/09/16 0932    Education provided Yes   Education Details HEP   Person(s) Educated Patient;Spouse   Methods Explanation;Demonstration;Verbal cues   Comprehension Verbalized understanding;Returned demonstration;Verbal cues required          SLP Short Term Goals - 12/30/15 0902    SLP SHORT TERM GOAL #1   Title pt will perform HEP for swallowing with occasional min A   Time 1   Period Weeks   Status Achieved   SLP SHORT TERM GOAL #2   Title pt will demo  swallow precuations with POS with rare min A   Time 1   Period Weeks   Status Achieved   SLP SHORT TERM GOAL #3   Title pt will state 3/4  memory compensations over 3 sessions   Time 1   Period Weeks   Status On-going          SLP Long Term Goals - 02/09/16 1704    SLP LONG TERM GOAL #1   Title pt will complete HEP for swallowing with rare min A   Time 2   Status On-going   SLP LONG TERM GOAL #2   Title pt will tell SLP how using memory strategies could benefit him   Status Achieved   SLP LONG TERM GOAL #3   Title pt will demo swallowing precautions with POs with modified independence over two sessions   Baseline one sesion 02-09-16   Time 2   Period Weeks   Status Revised          Plan - 02/09/16 1701    Clinical Impression Statement Pt's HEP for swallowing completed with mod A usually - SLP suggested pt's wife be around when pt completing HEP to correct PRN. Voice quality remains breathy.  He would benfit from con't skilled ST for addressing pt's ability to monitor precautions as well as complete HEP correctly, and to cont to monitor memory skills.   Speech Therapy Frequency 2x / week   Duration 2 weeks   Treatment/Interventions Aspiration precaution training;Pharyngeal strengthening exercises;Diet toleration management by SLP;Cognitive reorganization;SLP instruction and feedback;Compensatory strategies;Internal/external aids;Patient/family education;Functional tasks;Cueing hierarchy   Potential to Achieve Goals Fair   Potential Considerations Severity of impairments   Consulted and Agree with Plan of Care Patient;Family member/caregiver      Patient will benefit from skilled therapeutic intervention in order to improve the following deficits and impairments:   Dysphagia, pharyngeal phase  Cognitive communication deficit    Problem List Patient Active Problem List   Diagnosis Date Noted  . Esophageal reflux   . Elevated troponin   . Cardiorenal syndrome   .  HTN (hypertension), malignant   . Cerebrovascular accident (CVA) due to embolism of cerebral artery (HCC)   . Acute systolic heart failure (HCC)   . Acute on chronic combined systolic and diastolic heart failure (HCC)   . Pulmonary edema   . Encounter for feeding tube placement   . Hypertensive emergency 12/20/2015  . UTI (urinary tract infection) 12/14/2015  . Bacteremia, escherichia coli 12/14/2015  . Respiratory failure (HCC) 12/11/2015  . Acute respiratory failure with hypercapnia (HCC)   . Demand ischemia (HCC)   . Gait disturbance, post-stroke 12/09/2015  . Cognitive deficit, post-stroke 12/09/2015  . Numbness and tingling of both legs below knees 12/09/2015  . Long term (current) use of anticoagulants [Z79.01] 11/01/2015  . Ischemic stroke  of frontal lobe (HCC) 10/15/2015  . Anoxic encephalopathy (HCC) 10/15/2015  . Right-sided cerebrovascular accident (CVA) (HCC)   . Acute on chronic systolic congestive heart failure (HCC)   . HCAP (healthcare-associated pneumonia)   . Dysphagia as late effect of cerebrovascular disease   . Tobacco abuse   . Tachypnea   . Essential hypertension   . Type 2 diabetes mellitus with complication (HCC)   . Leukocytosis   . Absolute anemia   . Altered mental status   . Hemiplegia (HCC)   . Stroke (HCC)   . ARDS (adult respiratory distress syndrome) (HCC)   . Acute respiratory failure (HCC)   . STEMI (ST elevation myocardial infarction) (HCC) 09/28/2015  . Cardiac arrest (HCC)   . ST elevation myocardial infarction (STEMI) (HCC)   . Encounter for central line placement   . Hypokalemia   . Encephalopathy acute   . Acute hypoxemic respiratory failure (HCC)     SCHINKE,CARL ,MS, CCC-SLP  02/09/2016, 5:10 PM  [facility] [address] [place], [place], [address] Phone: [phone]   Fax:  [phone]   Name: DRAYVIN MAGGI MRN: [id number] Date of Birth: [birth year]

## 2016-02-09 NOTE — Patient Instructions (Signed)
Complete exercises with [name] around in case you need help.

## 2016-02-11 ENCOUNTER — Ambulatory Visit: Payer: Self-pay

## 2016-02-11 ENCOUNTER — Ambulatory Visit: Payer: BLUE CROSS/BLUE SHIELD

## 2016-02-11 ENCOUNTER — Encounter: Payer: Self-pay | Admitting: Physical Medicine & Rehabilitation

## 2016-02-11 ENCOUNTER — Ambulatory Visit (HOSPITAL_BASED_OUTPATIENT_CLINIC_OR_DEPARTMENT_OTHER): Payer: BLUE CROSS/BLUE SHIELD | Admitting: Physical Medicine & Rehabilitation

## 2016-02-11 ENCOUNTER — Encounter: Payer: Self-pay | Admitting: Physical Therapy

## 2016-02-11 ENCOUNTER — Encounter: Payer: BLUE CROSS/BLUE SHIELD | Attending: Physical Medicine & Rehabilitation

## 2016-02-11 ENCOUNTER — Ambulatory Visit: Payer: BLUE CROSS/BLUE SHIELD | Admitting: Physical Therapy

## 2016-02-11 VITALS — BP 132/81 | HR 80 | Resp 17

## 2016-02-11 VITALS — BP 154/92 | HR 69

## 2016-02-11 DIAGNOSIS — R1313 Dysphagia, pharyngeal phase: Secondary | ICD-10-CM

## 2016-02-11 DIAGNOSIS — I2510 Atherosclerotic heart disease of native coronary artery without angina pectoris: Secondary | ICD-10-CM | POA: Insufficient documentation

## 2016-02-11 DIAGNOSIS — R41841 Cognitive communication deficit: Secondary | ICD-10-CM

## 2016-02-11 DIAGNOSIS — R202 Paresthesia of skin: Secondary | ICD-10-CM

## 2016-02-11 DIAGNOSIS — K279 Peptic ulcer, site unspecified, unspecified as acute or chronic, without hemorrhage or perforation: Secondary | ICD-10-CM | POA: Insufficient documentation

## 2016-02-11 DIAGNOSIS — R200 Anesthesia of skin: Secondary | ICD-10-CM | POA: Insufficient documentation

## 2016-02-11 DIAGNOSIS — R498 Other voice and resonance disorders: Secondary | ICD-10-CM | POA: Insufficient documentation

## 2016-02-11 DIAGNOSIS — R2681 Unsteadiness on feet: Secondary | ICD-10-CM

## 2016-02-11 DIAGNOSIS — R2689 Other abnormalities of gait and mobility: Secondary | ICD-10-CM

## 2016-02-11 NOTE — Therapy (Signed)
[facility] [address] [place], [place], [address] Phone: [phone]   Fax:  [phone]  Speech Language Pathology Treatment  Patient Details  Name: Eddie [name] MRN: [id number] Date of Birth: 1950-08-21 Referring Provider: Claudette Laws, MD  Encounter Date: 02/11/2016      End of Session - 02/11/16 1007    Visit Number 13   Number of Visits 16   Date for SLP Re-Evaluation 02/22/16   Authorization - Visit Number 13   Authorization - Number of Visits 30   SLP Start Time 0807   SLP Stop Time  0847   SLP Time Calculation (min) 40 min   Activity Tolerance Patient tolerated treatment well      Past Medical History  Diagnosis Date  . PUD (peptic ulcer disease)   . CAD (coronary artery disease)     a. STEMI 09/2015 w/ DES to Prox LAD  . CVA (cerebral infarction)     a. 09/2015: acute small right frontal lobe infarct    Past Surgical History  Procedure Laterality Date  . Repair of peptic ulcer    . Cardiac catheterization N/A 09/28/2015    Procedure: Left Heart Cath and Coronary Angiography;  Surgeon: Runell Gess, MD;  Location: [facility] INVASIVE CV LAB;  Service: Cardiovascular;  Laterality: N/A;  . Cardiac catheterization N/A 10/08/2015    Procedure: Left Heart Cath and Coronary Angiography;  Surgeon: Lyn Records, MD;  Location: [facility] INVASIVE CV LAB;  Service: Cardiovascular;  Laterality: N/A;    There were no vitals filed for this visit.      Subjective Assessment - 02/11/16 0839    Subjective Pt forgot to check blood sugar this morning   Patient is accompained by: Family member  wife   Currently in Pain? No/denies               ADULT SLP TREATMENT - 02/11/16 0844    General Information   Behavior/Cognition Alert;Cooperative;Pleasant mood   Treatment Provided   Treatment provided Dysphagia   Dysphagia Treatment   Temperature Spikes Noted No   Oral Cavity - Dentition Missing dentition    Treatment Methods Therapeutic exercise;Patient/caregiver education   Patient observed directly with PO's Yes   Type of PO's observed Thin liquids;Regular   Type of cueing Verbal   Amount of cueing Minimal  occasionally   Other treatment/comments Pt ate cookie and drank H2O without overt s/s aspiration.  SLP shared that it was necessary for pt to keep up with exercises as well as other areas of his health care at home, namely BP monitoring and blood sugar monitoring as pt forgot to do both this morning.    Pain Assessment   Pain Assessment No/denies pain   Cognitive-Linquistic Treatment   Skilled Treatment SLP made chart for pt to track his BP and his blood sugar, and explained pt should keep chart on the refrigerator to remind him to check these itmes essential for his health.   Assessment / Recommendations / Plan   Plan Continue with current plan of care  d/c next session   Dysphagia Recommendations   Diet recommendations Regular;Thin liquid   Supervision Intermittent supervision to cue for compensatory strategies   Compensations Slow rate;Small sips/bites;Multiple dry swallows after each bite/sip   Postural Changes and/or Swallow Maneuvers Upright 30-60 min after meal   Progression Toward Goals   Progression toward goals Not progressing toward goals (comment)          SLP  Education - 02/11/16 1006    Education Details how a chart can help remember to check blood sugar and blood pressure   Person(s) Educated Patient;Spouse   Methods Explanation;Demonstration   Comprehension Verbalized understanding          SLP Short Term Goals - 12/30/15 0902    SLP SHORT TERM GOAL #1   Title pt will perform HEP for swallowing with occasional min A   Time 1   Period Weeks   Status Achieved   SLP SHORT TERM GOAL #2   Title pt will demo swallow precuations with POS with rare min A   Time 1   Period Weeks   Status Achieved   SLP SHORT TERM GOAL #3   Title pt will state 3/4  memory  compensations over 3 sessions   Time 1   Period Weeks   Status On-going          SLP Long Term Goals - 02/11/16 1010    SLP LONG TERM GOAL #1   Title pt will complete HEP for swallowing with rare min A   Time 2   Period Weeks   Status On-going   SLP LONG TERM GOAL #2   Title pt will tell SLP how using memory strategies could benefit him   Status Achieved   SLP LONG TERM GOAL #3   Title pt will demo swallowing precautions with POs with modified independence over two sessions   Baseline one sesion 02-09-16   Time 2   Period Weeks   Status Revised          Plan - 02/11/16 1007    Clinical Impression Statement SLP reiterated pt's wife be around when pt completing HEP to correct PRN. Voice quality remains breathy.  He would benfit from con't skilled ST for addressing pt's ability to monitor precautions as well as complete HEP correctly, and to cont to monitor memory skills. Pt and wife would like next visit to be last, as wife is comfortable helping pt with HEP and monitoring POs with pt.They wil focus last few visits auth by insurance on PT.   Speech Therapy Frequency 1x /week   Duration 1 week  next visit is last visit   Treatment/Interventions Aspiration precaution training;Pharyngeal strengthening exercises;Diet toleration management by SLP;Cognitive reorganization;SLP instruction and feedback;Compensatory strategies;Internal/external aids;Patient/family education;Functional tasks;Cueing hierarchy   Potential to Achieve Goals Fair   Potential Considerations Severity of impairments;Ability to learn/carryover information   Consulted and Agree with Plan of Care Patient;Family member/caregiver      Patient will benefit from skilled therapeutic intervention in order to improve the following deficits and impairments:   Dysphagia, pharyngeal phase  Cognitive communication deficit    Problem List Patient Active Problem List   Diagnosis Date Noted  . Esophageal reflux   .  Elevated troponin   . Cardiorenal syndrome   . HTN (hypertension), malignant   . Cerebrovascular accident (CVA) due to embolism of cerebral artery (HCC)   . Acute systolic heart failure (HCC)   . Acute on chronic combined systolic and diastolic heart failure (HCC)   . Pulmonary edema   . Encounter for feeding tube placement   . Hypertensive emergency 12/20/2015  . UTI (urinary tract infection) 12/14/2015  . Bacteremia, escherichia coli 12/14/2015  . Respiratory failure (HCC) 12/11/2015  . Acute respiratory failure with hypercapnia (HCC)   . Demand ischemia (HCC)   . Gait disturbance, post-stroke 12/09/2015  . Cognitive deficit, post-stroke 12/09/2015  . Numbness and tingling of  both legs below knees 12/09/2015  . Long term (current) use of anticoagulants [Z79.01] 11/01/2015  . Ischemic stroke of frontal lobe (HCC) 10/15/2015  . Anoxic encephalopathy (HCC) 10/15/2015  . Right-sided cerebrovascular accident (CVA) (HCC)   . Acute on chronic systolic congestive heart failure (HCC)   . HCAP (healthcare-associated pneumonia)   . Dysphagia as late effect of cerebrovascular disease   . Tobacco abuse   . Tachypnea   . Essential hypertension   . Type 2 diabetes mellitus with complication (HCC)   . Leukocytosis   . Absolute anemia   . Altered mental status   . Hemiplegia (HCC)   . Stroke (HCC)   . ARDS (adult respiratory distress syndrome) (HCC)   . Acute respiratory failure (HCC)   . STEMI (ST elevation myocardial infarction) (HCC) 09/28/2015  . Cardiac arrest (HCC)   . ST elevation myocardial infarction (STEMI) (HCC)   . Encounter for central line placement   . Hypokalemia   . Encephalopathy acute   . Acute hypoxemic respiratory failure (HCC)     Anuel Sitter ,MS, CCC-SLP  02/11/2016, 10:11 AM  [facility] [address] [place], [place], [address] Phone: [phone]   Fax:  [phone]   Name: Eddie [name] MRN:  [id number] Date of Birth: Aug 26, 1950

## 2016-02-11 NOTE — Patient Instructions (Signed)
Use the charts to help you remember to take your blood pressure and check your blood sugar.

## 2016-02-11 NOTE — Patient Instructions (Signed)
If swelling goes down and feet. We can do EMG of the lower extremities next visit

## 2016-02-11 NOTE — Therapy (Signed)
[facility] [address] [place], [place], [address] Phone: [phone]   Fax:  [phone]  Physical Therapy Treatment  Patient Details  Name: Eddie [name] MRN: [id number] Date of Birth: 08-May-1951 Referring Provider: Dr. [name]  Encounter Date: 02/11/2016      PT End of Session - 02/11/16 1340    Visit Number 11   Number of Visits 20   Date for PT Re-Evaluation 03/03/16   Authorization Type [employer]-30 visit combo limit   Authorization - Visit Number 11   Authorization - Number of Visits 14   PT Start Time 0849   PT Stop Time 0930   PT Time Calculation (min) 41 min   Equipment Utilized During Treatment Gait belt   Activity Tolerance Patient tolerated treatment well   Behavior During Therapy [facility] for tasks assessed/performed      Past Medical History  Diagnosis Date  . PUD (peptic ulcer disease)   . CAD (coronary artery disease)     a. STEMI 09/2015 w/ DES to Prox LAD  . CVA (cerebral infarction)     a. 09/2015: acute small right frontal lobe infarct    Past Surgical History  Procedure Laterality Date  . Repair of peptic ulcer    . Cardiac catheterization N/A 09/28/2015    Procedure: Left Heart Cath and Coronary Angiography;  Surgeon: Runell Gess, MD;  Location: [facility] INVASIVE CV LAB;  Service: Cardiovascular;  Laterality: N/A;  . Cardiac catheterization N/A 10/08/2015    Procedure: Left Heart Cath and Coronary Angiography;  Surgeon: Lyn Records, MD;  Location: [facility] INVASIVE CV LAB;  Service: Cardiovascular;  Laterality: N/A;    Filed Vitals:   02/11/16 7209 02/11/16 0913 02/11/16 0924 02/11/16 0929  BP: 158/100 manual 156/90 after gait 152/94 after balance 154/92 manual end of session  Pulse: 69     SpO2: 99%  96%         Subjective Assessment - 02/11/16 0855    Subjective No new compalints. No falls or pain to report. Did take his BP meds this am.   Patient is accompained by: Family member    Pertinent History MI, HTN, DM, CAD, L eye blindness. MD set BP parameters: 160/100   Patient Stated Goals Walk without cane, wash car without losing balance   Currently in Pain? No/denies            [facility] Adult PT Treatment/Exercise - 02/11/16 0859    Transfers   Transfers Sit to Stand;Stand to Sit   Sit to Stand 6: Modified independent (Device/Increase time);Without upper extremity assist;From chair/3-in-1   Stand to Sit 6: Modified independent (Device/Increase time);Without upper extremity assist;To chair/3-in-1   Ambulation/Gait   Ambulation/Gait Yes   Ambulation/Gait Assistance 4: Min guard;5: Supervision   Ambulation/Gait Assistance Details supervision on level surfaces, min guard assist on uneven paved surfaces for safety due to veering and general unsteadiness at times. No overt loss of balance noted.                                       Ambulation Distance (Feet) 500 Feet   Assistive device None   Gait Pattern Step-through pattern;Decreased stride length;Right foot flat;Left foot flat;Trendelenburg;Narrow base of support;Decreased arm swing - left   Ambulation Surface Level;Unlevel;Indoor;Outdoor;Paved             Balance Exercises - 02/11/16 [phone]  Balance Exercises: Standing   Standing Eyes Closed Wide (BOA);Foam/compliant surface;Head turns;Other reps (comment);20 secs;Limitations   SLS with Vectors Solid surface;Other reps (comment);Limitations   Balance Exercises: Standing   Standing Eyes Closed Limitations on gray foam pad with wide base of support: EC no head movements, EC head movements up<>down, left<>right and diagonals both ways x 10 reps each with min guard to min assist for balance.   SLS with Vectors Limitations 2 tall cones on floor: alternating fwd toe taps, cross toe taps, fwd double toe taps and cross double toe taps x 10 each bil legs with min assist for balance, no UE support.              PT Short Term Goals - 01/19/16 1647             PT Long Term Goals - 01/19/16 1640    PT LONG TERM GOAL #1   Title Pt will be IND in HEP to improve balance and strength. (New Target date: 03/03/16)   Baseline All unmet goals will be carried over to new POC: 01/27/16   Status Revised   PT LONG TERM GOAL #2   Title Pt will improve FGA score to >/=25/30 to decr. falls risk. (New Target date: 03/03/16)   Baseline All unmet goals will be carried over to new POC: 01/27/16   Status Revised   PT LONG TERM GOAL #3   Title Pt will amb. 300' modified independent with LRAD, over uneven terrain (sand, grass), in order to improve functional mobility and to go to the beach this summer/fall. (New Target date: 03/03/16)   Baseline All unmet goals will be carried over to new POC: 01/27/16   Status Revised   PT LONG TERM GOAL #4   Title Pt will amb. 800' over paved surfaces (even) modified independent, no AD, in order to walk dog and improve functional mobility. (New Target date: 03/03/16)   Baseline All unmet goals will be carried over to new POC: 01/27/16   Status Revised   PT LONG TERM GOAL #5   Title Pt will verbalize walking program and potentially how to progress to light jogging based on progress, in order to walk/jog with dog. Target date: 11/30/15   Baseline All unmet goals will be carried over to new POC: 01/27/16   Status Deferred            Plan - 02/11/16 0854    Clinical Impression Statement Seated rest breaks needed to allow BP to return to lower levels after activity. Manual BP's performed after inital one with machine due to incorrect readings being given on machine. No issues reported with gait or balance activites performed today. Pt is making steady progress toward goals.    Rehab Potential Good   Clinical Impairments Affecting Rehab Potential co-morbidities   PT Frequency 2x / week   PT Duration 6 weeks  PT requesting additional 2x/week for 4 weeks   PT Treatment/Interventions ADLs/Self Care Home Management;Biofeedback;Canalith  Repostioning;Therapeutic activities;Therapeutic exercise;Balance training;Manual techniques;Vestibular;Orthotic Fit/Training;Functional mobility training;Stair training;Gait training;DME Instruction;Patient/family education;Cognitive remediation   PT Next Visit Plan Continue to monitor BP & oxygen levels during session.  Balance/gait on compliant surfaces.  Gait outdoors if weather permits.   PT Home Exercise Plan Strengthening, balance, and flexibility HEP   Consulted and Agree with Plan of Care Patient   Family Member Consulted pt's wife: [name]      Patient will benefit from skilled therapeutic intervention in order to improve the following deficits and  impairments:  Abnormal gait, Decreased endurance, Decreased knowledge of use of DME, Decreased strength, Impaired flexibility, Decreased cognition, Decreased balance, Decreased mobility, Decreased activity tolerance  Visit Diagnosis: Other abnormalities of gait and mobility  Unsteadiness on feet     Problem List Patient Active Problem List   Diagnosis Date Noted  . Numbness and tingling in hands 02/11/2016  . Esophageal reflux   . Elevated troponin   . Cardiorenal syndrome   . HTN (hypertension), malignant   . Cerebrovascular accident (CVA) due to embolism of cerebral artery (HCC)   . Acute systolic heart failure (HCC)   . Acute on chronic combined systolic and diastolic heart failure (HCC)   . Pulmonary edema   . Encounter for feeding tube placement   . Hypertensive emergency 12/20/2015  . UTI (urinary tract infection) 12/14/2015  . Bacteremia, escherichia coli 12/14/2015  . Respiratory failure (HCC) 12/11/2015  . Acute respiratory failure with hypercapnia (HCC)   . Demand ischemia (HCC)   . Gait disturbance, post-stroke 12/09/2015  . Cognitive deficit, post-stroke 12/09/2015  . Numbness and tingling of both legs below knees 12/09/2015  . Long term (current) use of anticoagulants [Z79.01] 11/01/2015  . Ischemic stroke of  frontal lobe (HCC) 10/15/2015  . Anoxic encephalopathy (HCC) 10/15/2015  . Right-sided cerebrovascular accident (CVA) (HCC)   . Acute on chronic systolic congestive heart failure (HCC)   . HCAP (healthcare-associated pneumonia)   . Dysphagia as late effect of cerebrovascular disease   . Tobacco abuse   . Tachypnea   . Essential hypertension   . Type 2 diabetes mellitus with complication (HCC)   . Leukocytosis   . Absolute anemia   . Altered mental status   . Hemiplegia (HCC)   . Stroke (HCC)   . ARDS (adult respiratory distress syndrome) (HCC)   . Acute respiratory failure (HCC)   . STEMI (ST elevation myocardial infarction) (HCC) 09/28/2015  . Cardiac arrest (HCC)   . ST elevation myocardial infarction (STEMI) (HCC)   . Encounter for central line placement   . Hypokalemia   . Encephalopathy acute   . Acute hypoxemic respiratory failure (HCC)     Sallyanne Kuster, PTA, [facility] [address] [place], [place] [address] [phone] 02/11/2016, 1:53 PM   Name: COBEN GODSHALL MRN: [id number] Date of Birth: 04/28/1951

## 2016-02-11 NOTE — Progress Notes (Signed)
EMG/NCV performed see separate report Excessive lower extremity swelling could only perform upper extremity EMG today

## 2016-02-16 ENCOUNTER — Ambulatory Visit: Payer: BLUE CROSS/BLUE SHIELD | Admitting: Physical Therapy

## 2016-02-16 ENCOUNTER — Ambulatory Visit: Payer: BLUE CROSS/BLUE SHIELD

## 2016-02-16 DIAGNOSIS — R2689 Other abnormalities of gait and mobility: Secondary | ICD-10-CM

## 2016-02-16 DIAGNOSIS — R2681 Unsteadiness on feet: Secondary | ICD-10-CM

## 2016-02-16 DIAGNOSIS — R1313 Dysphagia, pharyngeal phase: Secondary | ICD-10-CM

## 2016-02-16 DIAGNOSIS — R41841 Cognitive communication deficit: Secondary | ICD-10-CM

## 2016-02-16 NOTE — Therapy (Signed)
[facility] [address] [place], [place], [address] Phone: [phone]   Fax:  [phone]  Speech Language Pathology Treatment  Patient Details  Name: Eddie [name] MRN: [id number] Date of Birth: March 24, 1951 Referring Provider: Alysia Penna, MD  Encounter Date: 02/16/2016      End of Session - 02/16/16 1206    Visit Number 14   Number of Visits 16   Date for SLP Re-Evaluation 02/22/16   Authorization - Visit Number 48   Authorization - Number of Visits 31   SLP Start Time 0805   SLP Stop Time  3295   SLP Time Calculation (min) 41 min      Past Medical History:  Diagnosis Date  . CAD (coronary artery disease)    a. STEMI 09/2015 w/ DES to Prox LAD  . CVA (cerebral infarction)    a. 09/2015: acute small right frontal lobe infarct  . PUD (peptic ulcer disease)     Past Surgical History:  Procedure Laterality Date  . CARDIAC CATHETERIZATION N/A 09/28/2015   Procedure: Left Heart Cath and Coronary Angiography;  Surgeon: Lorretta Harp, MD;  Location: [facility];  Service: Cardiovascular;  Laterality: N/A;  . CARDIAC CATHETERIZATION N/A 10/08/2015   Procedure: Left Heart Cath and Coronary Angiography;  Surgeon: Belva Crome, MD;  Location: [facility];  Service: Cardiovascular;  Laterality: N/A;  . Repair of Peptic Ulcer      There were no vitals filed for this visit.      Subjective Assessment - 02/16/16 0815    Subjective Blood sugars "have been good" and blood pressure has also been WNL.   Patient is accompained by: Family member  [name]               ADULT SLP TREATMENT - 02/16/16 0816      General Information   Behavior/Cognition Alert;Cooperative;Pleasant mood     Treatment Provided   Treatment provided Dysphagia     Dysphagia Treatment   Temperature Spikes Noted No   Oral Cavity - Dentition Missing dentition   Treatment Methods Therapeutic  exercise;Patient/caregiver education   Patient observed directly with PO's Yes   Type of PO's observed Thin liquids;Regular   Type of cueing Verbal   Amount of cueing Minimal   Other treatment/comments Wife led pt through HEP with SBA from SLP. Pt ate POs following swallowing precuations without cues needed.      Assessment / Recommendations / Plan   Plan Discharge SLP treatment due to (comment)  Pt independent with HEP/precautions at home, with wife's A     Dysphagia Recommendations   Diet recommendations Regular;Thin liquid   Supervision Intermittent supervision to cue for compensatory strategies   Compensations Slow rate;Small sips/bites;Multiple dry swallows after each bite/sip   Postural Changes and/or Swallow Maneuvers Upright 30-60 min after meal     Progression Toward Goals   Progression toward goals --  see goal update            SLP Short Term Goals - 12/30/15 0902      SLP SHORT TERM GOAL #1   Title pt will perform HEP for swallowing with occasional min A   Time 1   Period Weeks   Status Achieved     SLP SHORT TERM GOAL #2   Title pt will demo swallow precuations with POS with rare min A   Time 1   Period Weeks   Status Achieved  SLP SHORT TERM GOAL #3   Title pt will state 3/4  memory compensations over 3 sessions   Time 1   Period Weeks   Status On-going          SLP Long Term Goals - 02/16/16 1208      SLP LONG TERM GOAL #1   Title pt will complete HEP for swallowing with rare min A   Status Not Met     SLP LONG TERM GOAL #2   Title pt will tell SLP how using memory strategies could benefit him   Status Achieved     SLP LONG TERM GOAL #3   Title pt will demo swallowing precautions with POs with modified independence over two sessions   Baseline wife assists PRN   Status Achieved          Plan - 02/16/16 1206    Clinical Impression Statement Pt's wife stated she will be with pt when pt completing HEP to correct, PRN. Voice quality  remains breathy.  Pt's wife able to cue pt correctly for HEP, and pt is independent when he is without distraction, during POs. SLP reiterated pt should  not watch TV or have other distractions during meals. He would benfit from con't skilled ST for addressing pt's ability to monitor precautions as well as complete HEP correctly, and to cont to monitor memory skills. Pt and wife are satisfied with this being pt's last visit.   Duration --  next visit is last visit   Treatment/Interventions Aspiration precaution training;Pharyngeal strengthening exercises;Diet toleration management by SLP;Cognitive reorganization;SLP instruction and feedback;Compensatory strategies;Internal/external aids;Patient/family education;Functional tasks;Cueing hierarchy   Potential to Achieve Goals Fair   Potential Considerations Severity of impairments;Ability to learn/carryover information   Consulted and Agree with Plan of Care Patient;Family member/caregiver      Patient will benefit from skilled therapeutic intervention in order to improve the following deficits and impairments:   Dysphagia, pharyngeal phase  Cognitive communication deficit   SPEECH THERAPY DISCHARGE SUMMARY  Visits from Start of Care: 14  Current functional level related to goals / functional outcomes: Please see goal summary above. In sum, pt improved with his cognitive skills to baseline (pt with baseline deficits per pt/wife), and improved his ability to perform HEP for dysphagia. Pt is recommended regular diet with thin liquids with precautions as delineated on most current modified barium swallow exam. Pt was told he could cont exercises for one month then transition to 2-3 times per week after that. For various reasons, pt and wife needed significant encouragement to check pt's blood sugars and blood pressures daily. Pt had to refrain from PT for a small number of sessions due to uncontrolled BP.   Remaining deficits: Mild dysphagia,  cognitive deficits (baseline)   Education / Equipment: Swallow precautions, need for maintenance monitoring of blood sugars and blood pressure. Plan: Patient agrees to discharge.  Patient goals were not met. Patient is being discharged due to being pleased with the current functional level. and financial reasons (insurance benefits exhausted).?????       Problem List Patient Active Problem List   Diagnosis Date Noted  . Numbness and tingling in hands 02/11/2016  . Esophageal reflux   . Elevated troponin   . Cardiorenal syndrome   . HTN (hypertension), malignant   . Cerebrovascular accident (CVA) due to embolism of cerebral artery ([place])   . Acute systolic heart failure ([place])   . Acute on chronic combined systolic and diastolic heart failure ([place])   .  Pulmonary edema   . Encounter for feeding tube placement   . Hypertensive emergency 12/20/2015  . UTI (urinary tract infection) 12/14/2015  . Bacteremia, escherichia coli 12/14/2015  . Respiratory failure ([place]) 12/11/2015  . Acute respiratory failure with hypercapnia ([place])   . Demand ischemia ([place])   . Gait disturbance, post-stroke 12/09/2015  . Cognitive deficit, post-stroke 12/09/2015  . Numbness and tingling of both legs below knees 12/09/2015  . Long term (current) use of anticoagulants [Z79.01] 11/01/2015  . Ischemic stroke of frontal lobe ([place]) 10/15/2015  . Anoxic encephalopathy ([place]) 10/15/2015  . Right-sided cerebrovascular accident (CVA) ([place])   . Acute on chronic systolic congestive heart failure ([place])   . HCAP (healthcare-associated pneumonia)   . Dysphagia as late effect of cerebrovascular disease   . Tobacco abuse   . Tachypnea   . Essential hypertension   . Type 2 diabetes mellitus with complication ([place])   . Leukocytosis   . Absolute anemia   . Altered mental status   . Hemiplegia ([place])   . Stroke ([place])   . ARDS (adult respiratory distress syndrome) ([place])   . Acute respiratory failure ([place])   . STEMI (ST  elevation myocardial infarction) ([place]) 09/28/2015  . Cardiac arrest ([place])   . ST elevation myocardial infarction (STEMI) ([facility])   . Encounter for central line placement   . Hypokalemia   . Encephalopathy acute   . Acute hypoxemic respiratory failure ([place])     Ruth ,[place], [place]  02/16/2016, 12:10 PM  [facility] [address] [place], [place], [address] Phone: [phone]   Fax:  [phone]   Name: Eddie [name] MRN: [id number] Date of Birth: 1951/06/07

## 2016-02-16 NOTE — Therapy (Signed)
[facility] [address] [place], [place], [address] Phone: [phone]   Fax:  [phone]  Physical Therapy Treatment  Patient Details  Name: Eddie [name] MRN: [id number] Date of Birth: September 26, 1950 Referring Provider: Dr. [name]  Encounter Date: 02/16/2016      PT End of Session - 02/16/16 1053    Visit Number 12   Number of Visits 20   Date for PT Re-Evaluation 03/03/16   Authorization Type [employer]-30 visit combo limit   Authorization - Visit Number 12   Authorization - Number of Visits 14   PT Start Time 0850   PT Stop Time 0935   PT Time Calculation (min) 45 min   Equipment Utilized During Treatment Gait belt   Activity Tolerance Patient tolerated treatment well   Behavior During Therapy [facility] for tasks assessed/performed      Past Medical History:  Diagnosis Date  . CAD (coronary artery disease)    a. STEMI 09/2015 w/ DES to Prox LAD  . CVA (cerebral infarction)    a. 09/2015: acute small right frontal lobe infarct  . PUD (peptic ulcer disease)     Past Surgical History:  Procedure Laterality Date  . CARDIAC CATHETERIZATION N/A 09/28/2015   Procedure: Left Heart Cath and Coronary Angiography;  Surgeon: Runell Gess, MD;  Location: [facility];  Service: Cardiovascular;  Laterality: N/A;  . CARDIAC CATHETERIZATION N/A 10/08/2015   Procedure: Left Heart Cath and Coronary Angiography;  Surgeon: Lyn Records, MD;  Location: [facility];  Service: Cardiovascular;  Laterality: N/A;  . Repair of Peptic Ulcer      There were no vitals filed for this visit.      Subjective Assessment - 02/16/16 1050    Subjective Pt's wife reports he is out of a couple of his medications due to financial issues so has not been taking some.  She is unsure which meds but thinks one if for blood pressure.  Pt reports increasing walking at home.   Patient is accompained by: Family member  wife-Eddie [name]   Pertinent History MI, HTN, DM, CAD, L eye blindness. MD set BP parameters: 160/100   Patient Stated Goals Walk without cane, wash car without losing balance   Currently in Pain? No/denies          [facility] Adult PT Treatment/Exercise - 02/16/16 0001      Transfers   Transfers Sit to Stand;Stand to Sit   Sit to Stand 6: Modified independent (Device/Increase time);Without upper extremity assist;From chair/3-in-1   Stand to Sit 6: Modified independent (Device/Increase time);Without upper extremity assist;To chair/3-in-1     Ambulation/Gait   Ambulation/Gait Yes   Ambulation/Gait Assistance 5: Supervision   Ambulation Distance (Feet) 1000 Feet  plus   Assistive device None   Gait Pattern Step-through pattern;Decreased stride length;Right foot flat;Left foot flat;Trendelenburg;Narrow base of support;Decreased arm swing - left   Ambulation Surface Level;Unlevel;Indoor;Outdoor;Paved;Gravel;Grass;Other (comment)  mulch     Knee/Hip Exercises: Aerobic   Nustep Scifit level 2.0 all 4 extremities x 8 minutes           Balance Exercises - 02/16/16 1101      Balance Exercises: Standing   Rockerboard Anterior/posterior;Lateral;Head turns;EO;Other reps (comment);Intermittent UE support  20+ reps     Took vitals several times through out treatment.  Pt consistently elevated during session (172/94, 169/92, 174/96) with manual cuff on L arm.  Wife reports pt out of 2 medications and thinks one is for  BP although pt states he took his BP meds this morning at 8 am  Pt denies headache, weakness, numbness or blurry vision.         PT Education - 02/16/16 1052    Education provided Yes   Education Details Contacting MD concerning financial concerns with medications   Person(s) Educated Patient;Spouse   Methods Explanation   Comprehension Verbalized understanding          PT Short Term Goals - 01/19/16 1647            PT Long Term Goals - 01/19/16 1640      PT LONG TERM GOAL #1    Title Pt will be IND in HEP to improve balance and strength. (New Target date: 03/03/16)   Baseline All unmet goals will be carried over to new POC: 01/27/16   Status Revised     PT LONG TERM GOAL #2   Title Pt will improve FGA score to >/=25/30 to decr. falls risk. (New Target date: 03/03/16)   Baseline All unmet goals will be carried over to new POC: 01/27/16   Status Revised     PT LONG TERM GOAL #3   Title Pt will amb. 300' modified independent with LRAD, over uneven terrain (sand, grass), in order to improve functional mobility and to go to the beach this summer/fall. (New Target date: 03/03/16)   Baseline All unmet goals will be carried over to new POC: 01/27/16   Status Revised     PT LONG TERM GOAL #4   Title Pt will amb. 800' over paved surfaces (even) modified independent, no AD, in order to walk dog and improve functional mobility. (New Target date: 03/03/16)   Baseline All unmet goals will be carried over to new POC: 01/27/16   Status Revised     PT LONG TERM GOAL #5   Title Pt will verbalize walking program and potentially how to progress to light jogging based on progress, in order to walk/jog with dog. Target date: 11/30/15   Baseline All unmet goals will be carried over to new POC: 01/27/16   Status Deferred               Plan - 02/16/16 1053    Clinical Impression Statement Pt continues with elevated BP but wife reports pt out of 2 medications due to finanacial concerns/copayments and thinks one is a BP medication.  Discussed for wife to contact MD to see if there were other options or sources available.  Pt continues to increase mobility and strength.  Continue PT per POC.   Rehab Potential Good   Clinical Impairments Affecting Rehab Potential co-morbidities   PT Frequency 2x / week   PT Duration 6 weeks  PT requesting additional 2x/week for 4 weeks   PT Treatment/Interventions ADLs/Self Care Home Management;Biofeedback;Canalith Repostioning;Therapeutic  activities;Therapeutic exercise;Balance training;Manual techniques;Vestibular;Orthotic Fit/Training;Functional mobility training;Stair training;Gait training;DME Instruction;Patient/family education;Cognitive remediation   PT Next Visit Plan Continue to monitor BP & oxygen levels during session.  Follow up on if pt was able to refill medications.  If not, would [facility] CM be able to assist?   PT Home Exercise Plan Strengthening, balance, and flexibility HEP   Consulted and Agree with Plan of Care Patient   Family Member Consulted pt's wife: [name]      Patient will benefit from skilled therapeutic intervention in order to improve the following deficits and impairments:  Abnormal gait, Decreased endurance, Decreased knowledge of use of DME, Decreased strength, Impaired flexibility, Decreased  cognition, Decreased balance, Decreased mobility, Decreased activity tolerance  Visit Diagnosis: Other abnormalities of gait and mobility  Unsteadiness on feet     Problem List Patient Active Problem List   Diagnosis Date Noted  . Numbness and tingling in hands 02/11/2016  . Esophageal reflux   . Elevated troponin   . Cardiorenal syndrome   . HTN (hypertension), malignant   . Cerebrovascular accident (CVA) due to embolism of cerebral artery (HCC)   . Acute systolic heart failure (HCC)   . Acute on chronic combined systolic and diastolic heart failure (HCC)   . Pulmonary edema   . Encounter for feeding tube placement   . Hypertensive emergency 12/20/2015  . UTI (urinary tract infection) 12/14/2015  . Bacteremia, escherichia coli 12/14/2015  . Respiratory failure (HCC) 12/11/2015  . Acute respiratory failure with hypercapnia (HCC)   . Demand ischemia (HCC)   . Gait disturbance, post-stroke 12/09/2015  . Cognitive deficit, post-stroke 12/09/2015  . Numbness and tingling of both legs below knees 12/09/2015  . Long term (current) use of anticoagulants [Z79.01] 11/01/2015  . Ischemic stroke of  frontal lobe (HCC) 10/15/2015  . Anoxic encephalopathy (HCC) 10/15/2015  . Right-sided cerebrovascular accident (CVA) (HCC)   . Acute on chronic systolic congestive heart failure (HCC)   . HCAP (healthcare-associated pneumonia)   . Dysphagia as late effect of cerebrovascular disease   . Tobacco abuse   . Tachypnea   . Essential hypertension   . Type 2 diabetes mellitus with complication (HCC)   . Leukocytosis   . Absolute anemia   . Altered mental status   . Hemiplegia (HCC)   . Stroke (HCC)   . ARDS (adult respiratory distress syndrome) (HCC)   . Acute respiratory failure (HCC)   . STEMI (ST elevation myocardial infarction) (HCC) 09/28/2015  . Cardiac arrest (HCC)   . ST elevation myocardial infarction (STEMI) (HCC)   . Encounter for central line placement   . Hypokalemia   . Encephalopathy acute   . Acute hypoxemic respiratory failure (HCC)     Newell Coral 02/16/2016, 12:52 PM  [facility] [address] [place], [place], [address] Phone: [phone]   Fax:  [phone]  Name: CHIPPER KOUDELKA MRN: [id number] Date of Birth: 1951/03/14   Newell Coral, [place] [facility] 02/16/16 12:52 PM Phone: [phone] Fax: [phone]

## 2016-02-16 NOTE — Patient Instructions (Signed)
Continue to do the exercises for 4 more weeks, then 2-3 times per week after that.

## 2016-02-18 ENCOUNTER — Ambulatory Visit: Payer: BLUE CROSS/BLUE SHIELD

## 2016-02-18 ENCOUNTER — Telehealth: Payer: Self-pay

## 2016-02-18 VITALS — BP 165/104 | HR 69

## 2016-02-18 DIAGNOSIS — R2689 Other abnormalities of gait and mobility: Secondary | ICD-10-CM

## 2016-02-18 NOTE — Therapy (Signed)
[facility] [address] [place], [place], [address] Phone: [phone]   Fax:  [phone]  Physical Therapy Treatment  Patient Details  Name: Eddie [name] MRN: [id number] Date of Birth: 08-15-1950 Referring Provider: Dr. [name]  Encounter Date: 02/18/2016      PT End of Session - 02/18/16 0911    Visit Number 13   Number of Visits 20   Date for PT Re-Evaluation 03/03/16   Authorization Type [id number] visit combo limit   Authorization - Visit Number 13   Authorization - Number of Visits 14   PT Start Time 0846   PT Stop Time 0910   PT Time Calculation (min) 24 min   Activity Tolerance Treatment limited secondary to medical complications (Comment)   Behavior During Therapy [facility] for tasks assessed/performed      Past Medical History:  Diagnosis Date  . CAD (coronary artery disease)    a. STEMI 09/2015 w/ DES to Prox LAD  . CVA (cerebral infarction)    a. 09/2015: acute small right frontal lobe infarct  . PUD (peptic ulcer disease)     Past Surgical History:  Procedure Laterality Date  . CARDIAC CATHETERIZATION N/A 09/28/2015   Procedure: Left Heart Cath and Coronary Angiography;  Surgeon: Runell Gess, MD;  Location: [facility] INVASIVE CV LAB;  Service: Cardiovascular;  Laterality: N/A;  . CARDIAC CATHETERIZATION N/A 10/08/2015   Procedure: Left Heart Cath and Coronary Angiography;  Surgeon: Lyn Records, MD;  Location: [facility] INVASIVE CV LAB;  Service: Cardiovascular;  Laterality: N/A;  . Repair of Peptic Ulcer      Vitals:   02/18/16 0903  BP: (!) 165/104  Pulse: 69  BP assessed at rest. Second manual cuff reading: 162/100. Pt denied HA, weakness, N/T or lightheadedness.      Subjective Assessment - 02/18/16 0850    Subjective Pt denied falls or changes since last visit. Pt reported he did experience LOB while picking up an object he dropped. Pt reported he walked 2-3 blocks to/from the grocery store with  wife and he didn't feel tired.    Patient is accompained by: Family member  wife-[name]   Pertinent History MI, HTN, DM, CAD, L eye blindness. MD set BP parameters: 160/100   Patient Stated Goals Walk without cane, wash car without losing balance   Currently in Pain? No/denies                            Self Care:     PT Education - 02/18/16 0901    Education provided Yes   Education Details PT reiterated the importance of pt contacting MD today regarding financial issues with medications and that pt is out of BP meds. PT also educated pt on [employer] and provided handout and notified pt that PT will contact MD regarding referral. PT educated pt on the importance of calling MD right after appt to notify cardiologist pt is out of BP meds and that PT would also notify MD office.   Person(s) Educated Patient;Spouse   Methods Explanation;Handout   Comprehension Verbalized understanding;Need further instruction          PT Short Term Goals - 01/19/16 1647            PT Long Term Goals - 01/19/16 1640      PT LONG TERM GOAL #1   Title Pt will be IND in HEP to improve balance and  strength. (New Target date: 03/03/16)   Baseline All unmet goals will be carried over to new POC: 01/27/16   Status Revised     PT LONG TERM GOAL #2   Title Pt will improve FGA score to >/=25/30 to decr. falls risk. (New Target date: 03/03/16)   Baseline All unmet goals will be carried over to new POC: 01/27/16   Status Revised     PT LONG TERM GOAL #3   Title Pt will amb. 300' modified independent with LRAD, over uneven terrain (sand, grass), in order to improve functional mobility and to go to the beach this summer/fall. (New Target date: 03/03/16)   Baseline All unmet goals will be carried over to new POC: 01/27/16   Status Revised     PT LONG TERM GOAL #4   Title Pt will amb. 800' over paved surfaces (even) modified independent, no AD, in order to walk dog and  improve functional mobility. (New Target date: 03/03/16)   Baseline All unmet goals will be carried over to new POC: 01/27/16   Status Revised     PT LONG TERM GOAL #5   Title Pt will verbalize walking program and potentially how to progress to light jogging based on progress, in order to walk/jog with dog. Target date: 11/30/15   Baseline All unmet goals will be carried over to new POC: 01/27/16   Status Deferred               Plan - 02/18/16 0913    Clinical Impression Statement Pt limited again by elevated BP at rest and unable to perform physical activity. Today's session focused on self care/education. PT feels pt would benefit from [employer], due to CHF and HTN diagnoses. PT will contact MD. Continue with POC.    Rehab Potential Good   Clinical Impairments Affecting Rehab Potential co-morbidities   PT Frequency 2x / week   PT Duration 6 weeks  PT requesting additional 2x/week for 4 weeks   PT Treatment/Interventions ADLs/Self Care Home Management;Biofeedback;Canalith Repostioning;Therapeutic activities;Therapeutic exercise;Balance training;Manual techniques;Vestibular;Orthotic Fit/Training;Functional mobility training;Stair training;Gait training;DME Instruction;Patient/family education;Cognitive remediation   PT Next Visit Plan Continue to monitor BP & oxygen levels during session.  Follow up on if pt was able to refill medications.  If not, would [facility] be able to assist?   PT Home Exercise Plan Check goals and d/c.   Consulted and Agree with Plan of Care Patient   Family Member Consulted pt's wife: [name]      Patient will benefit from skilled therapeutic intervention in order to improve the following deficits and impairments:  Abnormal gait, Decreased endurance, Decreased knowledge of use of DME, Decreased strength, Impaired flexibility, Decreased cognition, Decreased balance, Decreased mobility, Decreased activity tolerance  Visit Diagnosis: Other abnormalities  of gait and mobility     Problem List Patient Active Problem List   Diagnosis Date Noted  . Numbness and tingling in hands 02/11/2016  . Esophageal reflux   . Elevated troponin   . Cardiorenal syndrome   . HTN (hypertension), malignant   . Cerebrovascular accident (CVA) due to embolism of cerebral artery (HCC)   . Acute systolic heart failure (HCC)   . Acute on chronic combined systolic and diastolic heart failure (HCC)   . Pulmonary edema   . Encounter for feeding tube placement   . Hypertensive emergency 12/20/2015  . UTI (urinary tract infection) 12/14/2015  . Bacteremia, escherichia coli 12/14/2015  . Respiratory failure (HCC) 12/11/2015  . Acute respiratory  failure with hypercapnia (HCC)   . Demand ischemia (HCC)   . Gait disturbance, post-stroke 12/09/2015  . Cognitive deficit, post-stroke 12/09/2015  . Numbness and tingling of both legs below knees 12/09/2015  . Long term (current) use of anticoagulants [Z79.01] 11/01/2015  . Ischemic stroke of frontal lobe (HCC) 10/15/2015  . Anoxic encephalopathy (HCC) 10/15/2015  . Right-sided cerebrovascular accident (CVA) (HCC)   . Acute on chronic systolic congestive heart failure (HCC)   . HCAP (healthcare-associated pneumonia)   . Dysphagia as late effect of cerebrovascular disease   . Tobacco abuse   . Tachypnea   . Essential hypertension   . Type 2 diabetes mellitus with complication (HCC)   . Leukocytosis   . Absolute anemia   . Altered mental status   . Hemiplegia (HCC)   . Stroke (HCC)   . ARDS (adult respiratory distress syndrome) (HCC)   . Acute respiratory failure (HCC)   . STEMI (ST elevation myocardial infarction) (HCC) 09/28/2015  . Cardiac arrest (HCC)   . ST elevation myocardial infarction (STEMI) (HCC)   . Encounter for central line placement   . Hypokalemia   . Encephalopathy acute   . Acute hypoxemic respiratory failure (HCC)     Delvis Kau L 02/18/2016, 9:15 AM  [facility] [address] [place], [place], [address] Phone: [phone]   Fax:  [phone]  Name: Eddie [name] MRN: [id number] Date of Birth: 08/01/1950   Zerita Boers, PT,DPT 02/18/16 9:15 AM Phone: [phone] Fax: [phone]

## 2016-02-18 NOTE — Telephone Encounter (Signed)
PT spoke with [facility] nurse and was informed that pt does not quality for [facility], as pt's insurance [employer] is not in network with [facility]. She provided [employer]BS referral number (800) [phone], in order to check pt's benefits.  PT called [employer] and they stated [facility] and [employer] must be in network or MD can attempt a prior auth.   PT called [facility] again and asked if they could attempt to become in network with [employer], and PT was told that [facility] can only see pt's currently in network.

## 2016-02-24 ENCOUNTER — Ambulatory Visit (HOSPITAL_COMMUNITY)
Admission: RE | Admit: 2016-02-24 | Discharge: 2016-02-24 | Disposition: A | Discharge status: Home / Self Care | Payer: BLUE CROSS/BLUE SHIELD | Source: Ambulatory Visit | Attending: Internal Medicine | Admitting: Internal Medicine

## 2016-02-24 ENCOUNTER — Encounter: Payer: Self-pay | Admitting: Cardiology

## 2016-02-24 ENCOUNTER — Encounter (HOSPITAL_COMMUNITY): Payer: Self-pay | Admitting: Internal Medicine

## 2016-02-24 VITALS — BP 140/84 | HR 78 | Wt 163.0 lb

## 2016-02-24 DIAGNOSIS — I110 Hypertensive heart disease with heart failure: Secondary | ICD-10-CM | POA: Insufficient documentation

## 2016-02-24 DIAGNOSIS — Z794 Long term (current) use of insulin: Secondary | ICD-10-CM | POA: Insufficient documentation

## 2016-02-24 DIAGNOSIS — I255 Ischemic cardiomyopathy: Secondary | ICD-10-CM | POA: Insufficient documentation

## 2016-02-24 DIAGNOSIS — E119 Type 2 diabetes mellitus without complications: Secondary | ICD-10-CM | POA: Insufficient documentation

## 2016-02-24 DIAGNOSIS — Z8711 Personal history of peptic ulcer disease: Secondary | ICD-10-CM | POA: Insufficient documentation

## 2016-02-24 DIAGNOSIS — I2102 ST elevation (STEMI) myocardial infarction involving left anterior descending coronary artery: Secondary | ICD-10-CM

## 2016-02-24 DIAGNOSIS — Z8673 Personal history of transient ischemic attack (TIA), and cerebral infarction without residual deficits: Secondary | ICD-10-CM | POA: Insufficient documentation

## 2016-02-24 DIAGNOSIS — Z955 Presence of coronary angioplasty implant and graft: Secondary | ICD-10-CM | POA: Insufficient documentation

## 2016-02-24 DIAGNOSIS — Z7982 Long term (current) use of aspirin: Secondary | ICD-10-CM | POA: Insufficient documentation

## 2016-02-24 DIAGNOSIS — I5022 Chronic systolic (congestive) heart failure: Secondary | ICD-10-CM | POA: Insufficient documentation

## 2016-02-24 DIAGNOSIS — I252 Old myocardial infarction: Secondary | ICD-10-CM | POA: Insufficient documentation

## 2016-02-24 DIAGNOSIS — I10 Essential (primary) hypertension: Secondary | ICD-10-CM

## 2016-02-24 DIAGNOSIS — F17210 Nicotine dependence, cigarettes, uncomplicated: Secondary | ICD-10-CM | POA: Insufficient documentation

## 2016-02-24 DIAGNOSIS — I2510 Atherosclerotic heart disease of native coronary artery without angina pectoris: Secondary | ICD-10-CM | POA: Insufficient documentation

## 2016-02-24 DIAGNOSIS — Z79899 Other long term (current) drug therapy: Secondary | ICD-10-CM | POA: Insufficient documentation

## 2016-02-24 DIAGNOSIS — Z7901 Long term (current) use of anticoagulants: Secondary | ICD-10-CM | POA: Insufficient documentation

## 2016-02-24 DIAGNOSIS — Z9119 Patient's noncompliance with other medical treatment and regimen: Secondary | ICD-10-CM | POA: Insufficient documentation

## 2016-02-24 MED ORDER — SPIRONOLACTONE 25 MG PO TABS: 12.5000 mg | ORAL_TABLET | Freq: Every day | ORAL | 2 refills | Status: DC

## 2016-02-24 MED ORDER — CLOPIDOGREL BISULFATE 75 MG PO TABS
75.0000 mg | ORAL_TABLET | Freq: Every day | ORAL | 3 refills | Status: DC
Start: 2016-02-24 — End: 2016-02-24

## 2016-02-24 MED ORDER — CARVEDILOL 6.25 MG PO TABS: 6.2500 mg | ORAL_TABLET | Freq: Two times a day (BID) | ORAL | 2 refills | Status: DC

## 2016-02-24 MED ORDER — SPIRONOLACTONE 25 MG PO TABS
12.5000 mg | ORAL_TABLET | Freq: Every day | ORAL | 2 refills | Status: DC
Start: 2016-02-24 — End: 2016-10-11

## 2016-02-24 MED ORDER — CLOPIDOGREL BISULFATE 75 MG PO TABS: 75.0000 mg | ORAL_TABLET | Freq: Every day | ORAL | 2 refills | Status: DC

## 2016-02-24 MED ORDER — FUROSEMIDE 40 MG PO TABS: 40.0000 mg | ORAL_TABLET | Freq: Every day | ORAL | 2 refills | Status: DC

## 2016-02-24 MED ORDER — FUROSEMIDE 40 MG PO TABS
40.0000 mg | ORAL_TABLET | Freq: Every day | ORAL | 1 refills | Status: DC
Start: 2016-02-24 — End: 2016-02-24

## 2016-02-24 MED FILL — SPIRONOLACTONE 25 MG TABLET: 25 | 30 days supply | Qty: 15 | Fill #0

## 2016-02-24 MED FILL — FUROSEMIDE 40 MG TABLET: 40 | 30 days supply | Qty: 30 | Fill #0

## 2016-02-24 MED FILL — CARVEDILOL 6.25 MG TABLET: 6.25 | 30 days supply | Qty: 60 | Fill #0

## 2016-02-24 MED FILL — CLOPIDOGREL 75 MG TABLET: 75 | 30 days supply | Qty: 30 | Fill #0

## 2016-02-24 NOTE — Progress Notes (Deleted)
Cardiology Office Note   Date:  02/24/2016   ID:  Eddie [name], DOB February 13, 1951, MRN [id number]  PCP:  Philis Fendt, MD  Cardiologist:   Minus Breeding, MD   No chief complaint on file.     History of Present Illness: Eddie [name] is a 65 y.o. male who hs a STEMI, s/p DES LAD with post-cath CVA in March of 2017. Relook cath that same month demonstrated that the stent was patent with medical therapy for OM1 80% and RCA 65%. EF was 15 - 20% on echo in May.   He was hospitalized on 5/20 with respiratory failure and Escherichia coli bacteremia.Marland Kitchen He was discharged and admitted again on 529 with respiratory failure and hypertensive urgency. Both visits necessitated intubation.  Most recently he was admitted in June 19 with respiratory failure thought to be related to acute on chronic heart failure secondary to dietary and medication noncompliance.  ***   Discharge from rehabilitation, Eddie [name] has done very well. He was able to get all of his medications except for the blood glucose meter and insulin pen. He also is not on Brilinta at this time. His wife helps manage his medications and they may have accidentally not picked up the Brilinta at the pharmacy.  The glucose meter, insulin pen and possibly the Brilinta were included in a package of medications that were going to cost over $300 and they could not afford it.  He has not had chest pain or shortness of breath. He has not had lower extremity edema, orthopnea or PND. He states that he is starting to feel much better and feels that he is getting stronger. His wife states that they will be compliant with the exercises given to them at rehabilitation and they believe he can continue to increase his strength as an outpatient.  They do not have a home blood pressure cuff and he does not have a way to check his blood pressure. He has not taken his home blood pressure medications this morning because he has not eaten yet due to the early  appointment. He has not been weighing because the scale are old.   Past Medical History:  Diagnosis Date  . CAD (coronary artery disease)    a. STEMI 09/2015 w/ DES to Prox LAD  . CVA (cerebral infarction)    a. 09/2015: acute small right frontal lobe infarct  . PUD (peptic ulcer disease)     Past Surgical History:  Procedure Laterality Date  . CARDIAC CATHETERIZATION N/A 09/28/2015   Procedure: Left Heart Cath and Coronary Angiography;  Surgeon: Lorretta Harp, MD;  Location: [place] CV LAB;  Service: Cardiovascular;  Laterality: N/A;  . CARDIAC CATHETERIZATION N/A 10/08/2015   Procedure: Left Heart Cath and Coronary Angiography;  Surgeon: Belva Crome, MD;  Location: [place] CV LAB;  Service: Cardiovascular;  Laterality: N/A;  . Repair of Peptic Ulcer       Current Outpatient Prescriptions  Medication Sig Dispense Refill  . atorvastatin (LIPITOR) 80 MG tablet Take 1 tablet (80 mg total) by mouth daily at 6 PM. 30 tablet 5  . blood glucose meter kit and supplies KIT Dispense based on patient and insurance preference. Use up to four times daily as directed. (FOR ICD-9 250.00, 250.01). 1 each 0  . carvedilol (COREG) 3.125 MG tablet Take 1 tablet (3.125 mg total) by mouth 2 (two) times daily with a meal. 60 tablet 1  . clopidogrel (PLAVIX) 75 MG  tablet Take 4 tablets today then take 1 tablet by mouth daily after that (Patient taking differently: Take 75 mg by mouth daily. ) 94 tablet 3  . digoxin (LANOXIN) 0.125 MG tablet Take 1 tablet (0.125 mg total) by mouth daily. 30 tablet 1  . furosemide (LASIX) 40 MG tablet Take 1 tablet (40 mg total) by mouth daily. 30 tablet 1  . Insulin Glargine (LANTUS) 100 UNIT/ML Solostar Pen Inject 10 Units into the skin daily at 10 pm. 15 mL 0  . Insulin Pen Needle 31G X 5 MM MISC 1 each by Does not apply route 3 (three) times daily before meals. 90 each 0  . ipratropium-albuterol (DUONEB) 0.5-2.5 (3) MG/3ML SOLN Take 3 mLs by nebulization every  2 (two) hours as needed. 360 mL 5  . metFORMIN (GLUCOPHAGE) 850 MG tablet Take 1 tablet (850 mg total) by mouth daily with breakfast. 30 tablet 0  . nitroGLYCERIN (NITROSTAT) 0.4 MG SL tablet Use one pill every 5 minutes X 3 if needed for chest pain. Contact MD if pain does not improve (Patient taking differently: Place 0.4 mg under the tongue every 5 (five) minutes as needed for chest pain. Use one pill every 5 minutes X 3 if needed for chest pain. Contact MD if pain does not improve) 30 tablet 0  . pantoprazole (PROTONIX) 40 MG tablet Take 1 tablet (40 mg total) by mouth daily at 12 noon. 30 tablet 1  . sacubitril-valsartan (ENTRESTO) 49-51 MG Take 1 tablet by mouth 2 (two) times daily. 60 tablet 1  . spironolactone (ALDACTONE) 25 MG tablet Take 1 tablet (25 mg total) by mouth daily. 30 tablet 1  . warfarin (COUMADIN) 5 MG tablet Take 1 tablet daily or as directed by coumadin clinic (Patient taking differently: Take 5-7.5 mg by mouth daily. Take 5 mg on Sun/Tues/Wed/Thurs/Sat and 7.5 mg on Mon/Fri) 90 tablet 3   No current facility-administered medications for this visit.     Allergies:   Review of patient's allergies indicates no known allergies.    Social History:  The patient  reports that he has been smoking.  He has been smoking about 0.25 packs per day. He uses smokeless tobacco. He reports that he does not drink alcohol.   Family History:  The patient's ***family history is not on file.    ROS:  Please see the history of present illness.   Otherwise, review of systems are positive for {NONE DEFAULTED:18576::"none"}.   All other systems are reviewed and negative.    PHYSICAL EXAM: VS:  There were no vitals taken for this visit. , BMI There is no height or weight on file to calculate BMI. GENERAL:  Well appearing HEENT:  Pupils equal round and reactive, fundi not visualized, oral mucosa unremarkable NECK:  No jugular venous distention, waveform within normal limits, carotid upstroke  brisk and symmetric, no bruits, no thyromegaly LYMPHATICS:  No cervical, inguinal adenopathy LUNGS:  Clear to auscultation bilaterally BACK:  No CVA tenderness CHEST:  Unremarkable HEART:  PMI not displaced or sustained,S1 and S2 within normal limits, no S3, no S4, no clicks, no rubs, *** murmurs ABD:  Flat, positive bowel sounds normal in frequency in pitch, no bruits, no rebound, no guarding, no midline pulsatile mass, no hepatomegaly, no splenomegaly EXT:  2 plus pulses throughout, no edema, no cyanosis no clubbing SKIN:  No rashes no nodules NEURO:  Cranial nerves II through XII grossly intact, motor grossly intact throughout PSYCH:  Cognitively intact, oriented to person  place and time    EKG:  EKG {ACTION; IS/IS HFW:[id number]} ordered today. The ekg ordered today demonstrates ***   Recent Labs: 10/05/2015: TSH 0.196 01/05/2016: ALT 23; B Natriuretic Peptide 1,148.9 01/07/2016: Magnesium 2.0 01/09/2016: BUN 22; Creatinine, Ser 1.16; Hemoglobin 8.7; Platelets 274; Potassium 4.0; Sodium 137    Lipid Panel    Component Value Date/Time   CHOL 136 10/05/2015 1522   TRIG 63 01/05/2016 1310   HDL 19 (L) 10/05/2015 1522   CHOLHDL 7.2 10/05/2015 1522   VLDL 18 10/05/2015 1522   LDLCALC 99 10/05/2015 1522      Wt Readings from Last 3 Encounters:  01/09/16 150 lb 5.7 oz (68.2 kg)  12/23/15 151 lb (68.5 kg)  12/15/15 162 lb 14.4 oz (73.9 kg)      Other studies Reviewed: Additional studies/ records that were reviewed today include:  Extensive review of hospital records.   (Greater than 40 minutes reviewing all data with greater than 50% face to face with the patient). *** Review of the above records demonstrates:  Please see elsewhere in the note.     ASSESSMENT AND PLAN:  ANTERIOR MI:    CVA :   DM:  HTN:  CARDIOMYOPATHY:   Current medicines are reviewed at length with the patient today.  The patient {ACTIONS; HAS/DOES NOT HAVE:19233} concerns regarding  medicines.  The following changes have been made:  {PLAN; NO CHANGE:13088:s}  Labs/ tests ordered today include: *** No orders of the defined types were placed in this encounter.    Disposition:   FU with ***    Signed, Minus Breeding, MD  02/24/2016 7:40 AM    [facility]

## 2016-02-24 NOTE — Patient Instructions (Addendum)
Stop Coumadin, Digoxin, and Lisinopril.  Start Plavix 75mg  daily.  Start Spironolactone 12.5 daily.  Start Lasix 40mg  daily.  Follow up 2 weeks with Pharmacist.  Follow up with Dr.Rodell Marrs in 2 months.

## 2016-02-24 NOTE — Progress Notes (Signed)
ADVANCED HF CLINIC CONSULT NOTE  Referring Physician: Primary Care: Primary Cardiologist:  HPI:  Mr. Selmer is a 65 y/o male with h/o DM2, CAD s/p NSTEMI with LAD stent in 3/17, CVA, chronic systolic HF with EF 06-26%.   Had multiple admissions in May and June 2017 in the setting of decompensated CHF and EColi bacteremia. Readmitted in June 2017 with acute respiratory failure requiring intubation in setting of severe HTN and ADHF. Treated with milrinone which was eventually weaned off.    Return for post-hospital f/u. Feels good. Going to neuro rehab. Had a fall last week when he got up out of bed,   Weight fluctuates 150-158. No CP. + LE edema. Does not take extra lasix. Has run out of some medications for2 weeks. Only taking entresto, atorva and carvedilol and asa. Out of coumadin, plavix and spiro. Trying to watch diet more closely.    Past Medical History:  Diagnosis Date  . CAD (coronary artery disease)    a. STEMI 09/2015 w/ DES to Prox LAD  . CVA (cerebral infarction)    a. 09/2015: acute small right frontal lobe infarct  . Diabetes ([place])   . Hypertension   . Ischemic cardiomyopathy   . PUD (peptic ulcer disease)     Current Outpatient Prescriptions  Medication Sig Dispense Refill  . aspirin 81 MG tablet Take 81 mg by mouth daily.    Marland Kitchen atorvastatin (LIPITOR) 80 MG tablet Take 1 tablet (80 mg total) by mouth daily at 6 PM. 30 tablet 5  . blood glucose meter kit and supplies KIT Dispense based on patient and insurance preference. Use up to four times daily as directed. (FOR ICD-9 250.00, 250.01). 1 each 0  . carvedilol (COREG) 6.25 MG tablet Take 6.25 mg by mouth 2 (two) times daily with a meal.    . ipratropium-albuterol (DUONEB) 0.5-2.5 (3) MG/3ML SOLN Take 3 mLs by nebulization every 2 (two) hours as needed. 360 mL 5  . pantoprazole (PROTONIX) 40 MG tablet Take 1 tablet (40 mg total) by mouth daily at 12 noon. 30 tablet 1  . sacubitril-valsartan (ENTRESTO) 49-51 MG  Take 1 tablet by mouth 2 (two) times daily. 60 tablet 1  . clopidogrel (PLAVIX) 75 MG tablet Take 75 mg by mouth daily.    . Insulin Glargine (LANTUS) 100 UNIT/ML Solostar Pen Inject 10 Units into the skin daily at 10 pm. (Patient not taking: Reported on 02/24/2016) 15 mL 0  . Insulin Pen Needle 31G X 5 MM MISC 1 each by Does not apply route 3 (three) times daily before meals. 90 each 0  . metFORMIN (GLUCOPHAGE) 850 MG tablet Take 1 tablet (850 mg total) by mouth daily with breakfast. (Patient not taking: Reported on 02/24/2016) 30 tablet 0  . nitroGLYCERIN (NITROSTAT) 0.4 MG SL tablet Use one pill every 5 minutes X 3 if needed for chest pain. Contact MD if pain does not improve (Patient not taking: Reported on 02/24/2016) 30 tablet 0  . spironolactone (ALDACTONE) 25 MG tablet Take 12.5 mg by mouth daily.    Marland Kitchen warfarin (COUMADIN) 5 MG tablet Take 1 tablet daily or as directed by coumadin clinic (Patient not taking: Reported on 02/24/2016) 90 tablet 3   No current facility-administered medications for this encounter.     No Known Allergies    Social History   Social History  . Marital status: Married    Spouse name: N/A  . Number of children: N/A  . Years of education:  N/A   Occupational History  . Not on file.   Social History Main Topics  . Smoking status: Current Every Day Smoker    Packs/day: 0.25  . Smokeless tobacco: Current User  . Alcohol use No  . Drug use: Unknown  . Sexual activity: Not on file   Other Topics Concern  . Not on file   Social History Narrative  . No narrative on file     No family history on file.  Vitals:   02/24/16 1502  BP: 140/84  Pulse: 78  SpO2: 98%  Weight: 163 lb (73.9 kg)    PHYSICAL EXAM: General:  Thin Well appearing. No respiratory difficulty HEENT: normal Neck: supple. JVP 8-9Carotids 2+ bilat; no bruits. No lymphadenopathy or thryomegaly appreciated. Cor: PMI laterally displaced. Regular rate & rhythm. No rubs, gallops or  murmurs. Lungs: clear Abdomen: soft, nontender, nondistended. No hepatosplenomegaly. No bruits or masses. Good bowel sounds. Extremities: no cyanosis, clubbing, rash, 1-2+ edema Neuro: alert & oriented x 3, cranial nerves grossly intact. moves all 4 extremities w/o difficulty. Affect pleasant.  ASSESSMENT & PLAN: 1. Chronicsystolic CHF (EF 01-48%), due to ICM.  - NYHA class IIsymptoms. Volume status mildly elevated.  - He is out of several of his medicines and has h/o severe noncompliance. Will try to simplify his regimen.  - Stop coumadin, digoxin - Continue carvedilol to 6.25 mg BID and Entresto 49-51 mg BID, resume spironolactone 12.5 mg daily - Start lasix '40mg'$  daily - Reinforced need for daily weights and reviewed use of sliding scale diuretics. - Will f/u PharmD at next visit - Enroll in [place] for now. If Ef remains down after complying with GDMT consider ICD 2. CAD  - Continue ASA 81 mg daily, statin, b-blocker - Now off coumadin wil start clopidogrel 75 mg daily, 3. HTN 3. CVA - Stop coumadin due to non compliance. Treat with ASA, plavix and statin -continue rehab 4. DMII - Continue Lantus per PCP - On statin and ARNI  Total time spent 45 minutes. Over half that time spent discussing above.   Donnel Venuto,MD 11:45 PM

## 2016-02-25 ENCOUNTER — Ambulatory Visit: Payer: Self-pay | Admitting: Cardiology

## 2016-02-25 ENCOUNTER — Encounter: Payer: Self-pay | Admitting: *Deleted

## 2016-03-03 ENCOUNTER — Ambulatory Visit: Payer: Self-pay | Attending: Physical Medicine & Rehabilitation

## 2016-03-03 DIAGNOSIS — R2681 Unsteadiness on feet: Secondary | ICD-10-CM | POA: Insufficient documentation

## 2016-03-03 DIAGNOSIS — R2689 Other abnormalities of gait and mobility: Secondary | ICD-10-CM | POA: Insufficient documentation

## 2016-03-08 ENCOUNTER — Ambulatory Visit (HOSPITAL_COMMUNITY)
Admission: RE | Admit: 2016-03-08 | Discharge: 2016-03-08 | Disposition: A | Discharge status: Home / Self Care | Payer: Self-pay | Source: Ambulatory Visit | Attending: Cardiology | Admitting: Cardiology

## 2016-03-08 VITALS — BP 112/78 | HR 81 | Wt 153.4 lb

## 2016-03-08 DIAGNOSIS — E119 Type 2 diabetes mellitus without complications: Secondary | ICD-10-CM | POA: Insufficient documentation

## 2016-03-08 DIAGNOSIS — I2510 Atherosclerotic heart disease of native coronary artery without angina pectoris: Secondary | ICD-10-CM | POA: Insufficient documentation

## 2016-03-08 DIAGNOSIS — I252 Old myocardial infarction: Secondary | ICD-10-CM | POA: Insufficient documentation

## 2016-03-08 DIAGNOSIS — Z794 Long term (current) use of insulin: Secondary | ICD-10-CM | POA: Insufficient documentation

## 2016-03-08 DIAGNOSIS — I5022 Chronic systolic (congestive) heart failure: Secondary | ICD-10-CM | POA: Insufficient documentation

## 2016-03-08 DIAGNOSIS — I110 Hypertensive heart disease with heart failure: Secondary | ICD-10-CM | POA: Insufficient documentation

## 2016-03-08 DIAGNOSIS — Z7982 Long term (current) use of aspirin: Secondary | ICD-10-CM | POA: Insufficient documentation

## 2016-03-08 DIAGNOSIS — Z79899 Other long term (current) drug therapy: Secondary | ICD-10-CM | POA: Insufficient documentation

## 2016-03-08 DIAGNOSIS — I2102 ST elevation (STEMI) myocardial infarction involving left anterior descending coronary artery: Secondary | ICD-10-CM

## 2016-03-08 DIAGNOSIS — Z955 Presence of coronary angioplasty implant and graft: Secondary | ICD-10-CM | POA: Insufficient documentation

## 2016-03-08 DIAGNOSIS — Z7902 Long term (current) use of antithrombotics/antiplatelets: Secondary | ICD-10-CM | POA: Insufficient documentation

## 2016-03-08 DIAGNOSIS — I5021 Acute systolic (congestive) heart failure: Secondary | ICD-10-CM

## 2016-03-08 DIAGNOSIS — Z8673 Personal history of transient ischemic attack (TIA), and cerebral infarction without residual deficits: Secondary | ICD-10-CM | POA: Insufficient documentation

## 2016-03-08 LAB — BASIC METABOLIC PANEL
ANION GAP: 7 (ref 5–15)
BUN: 13 mg/dL (ref 6–20)
CALCIUM: 9.1 mg/dL (ref 8.9–10.3)
CO2: 30 mmol/L (ref 22–32)
Chloride: 102 mmol/L (ref 101–111)
Creatinine, Ser: 1.58 mg/dL — ABNORMAL HIGH (ref 0.61–1.24)
GFR, EST AFRICAN AMERICAN: 52 mL/min — AB (ref >60)
GFR, EST NON AFRICAN AMERICAN: 45 mL/min — AB (ref >60)
Glucose, Bld: 110 mg/dL — ABNORMAL HIGH (ref 65–99)
Potassium: 3.2 mmol/L — ABNORMAL LOW (ref 3.5–5.1)
SODIUM: 139 mmol/L (ref 135–145)

## 2016-03-08 MED ORDER — ATORVASTATIN CALCIUM 80 MG PO TABS: 80.0000 mg | ORAL_TABLET | Freq: Every day | ORAL | 5 refills | Status: DC

## 2016-03-08 MED ORDER — SACUBITRIL-VALSARTAN 49-51 MG PO TABS: 1.0000 | ORAL_TABLET | Freq: Two times a day (BID) | ORAL | 5 refills | Status: DC

## 2016-03-08 MED ORDER — CARVEDILOL 12.5 MG PO TABS: 12.5000 mg | ORAL_TABLET | Freq: Two times a day (BID) | ORAL | 5 refills | Status: DC

## 2016-03-08 MED FILL — ATORVASTATIN 80 MG TABLET: 80 | 30 days supply | Qty: 30 | Fill #0

## 2016-03-08 MED FILL — CARVEDILOL 12.5 MG TABLET: 12.5 | 30 days supply | Qty: 60 | Fill #0

## 2016-03-08 NOTE — Progress Notes (Signed)
HPI:  65 y/o male with h/o CAD s/p NSTEMI with LAD stent in 3/17, CVA, chronic heart failure with reduced EF (15-20%). Discharged on 01/05/16 with HF exacerbation. Discharge weight 148 lbs.   Feels good. Going to cardiac rehab. Weight at home fluctuates 150-158. No CP. Has been compliant with medications since last clinic visit at which time his spironolactone, clopidogrel and furosemide were restarted.      . Shortness of breath/dyspnea on exertion? no  . Orthopnea/PND? no . Edema? no . Lightheadedness/dizziness? no . Daily weights at home? Yes - stable around 153 lb  . Blood pressure/heart rate monitoring at home? no . Following low-sodium/fluid-restricted diet? Yes - trying to eat baked foods but does admit to >2L of fluid most days (water, Koolaid, tea)  HF Medications: Carvedilol 6.25 mg PO BID Furosemide 40 mg PO daily  Entresto 49-51 mg PO BID Spironolactone 12.5 mg PO daily  Has the patient been experiencing any side effects to the medications prescribed?  no  Does the patient have any problems obtaining medications due to transportation or finances?   Yes - working on [employer], currently no insurance since wife has not paid premium on her Nurse, learning disability   Understanding of regimen: fair Understanding of indications: fair Potential of compliance: good Patient understands to avoid NSAIDs. Patient understands to avoid decongestants.    Pertinent Lab Values: . 03/08/16: Serum creatinine 1.58 (BL ~1.2), BUN 13, Potassium 3.2, Sodium 139  Vital Signs: . Weight: 153.4 lb (dry weight: ~150 lb) . Blood pressure: 112/78 mmHg  . Heart rate: 81 bpm   Assessment: 1. Chronicsystolic CHF (EF 20-80%), due to ICM. NYHA class IIsymptoms. - Weight down closer to dry weight but BMET suggests patient may be overdiuresed  - Hold furosemide x 2 days, then decrease to 40 mg alternating with 20 mg daily  - Increase carvedilol to 12.5 mg BID - Continue Entresto 49-51 mg BID,  spironolactone 12.5 mg daily - If able to get Medicare insurance, could consider addition of Bidil as BP allows - Referred to paramedicine - Basic disease state pathophysiology, medication indication, mechanism and side effects reviewed at length with patient and he verbalized understanding 2. CAD - STEMI - Continue ASA 81 mg daily, clopidogrel 75 mg daily, atorvastatin 80 mg daily, carvedilol, Entresto, spironolactone as above 3. HTN - BP at goal today - Continue Entresto and spironolactone as above 4. CVA - Continue ASA and clopidogrel daily  5. DMII - Continue Lantus per PCP  Plan: 1) Medication changes: Based on clinical presentation, vital signs and recent labs will increase carvedilol to 12.5 mg BID, hold furosemide x 2 days then decrease to 40 mg alternating with 20 mg daily 2) Labs: BMET today 3) Follow-up: BMET in 1 week, DB in 6 weeks   Erika K. Bonnye Fava, PharmD, BCPS, CPP Clinical Pharmacist Pager: [phone] Phone: [phone] 03/08/2016 2:18 PM  Agree with changes as above.  Briselda Naval,MD 10:39 PM

## 2016-03-08 NOTE — Patient Instructions (Addendum)
It was great to see you today!   Please INCREASE your carvedilol to 12.5 mg TWICE DAILY (May take 2 of your 6.25 mg tablets TWICE DAILY until you get your new prescription for the 12.5 mg tablets).  Labs today. We will call you with any abnormalities.   Please make an appointment to see Dr. Gala Romney in 6 weeks.

## 2016-03-09 ENCOUNTER — Telehealth (HOSPITAL_COMMUNITY): Payer: Self-pay | Admitting: Pharmacist

## 2016-03-09 NOTE — Progress Notes (Signed)
Paramedicine Multidisciplinary Team Update  Eddie [name] is enrolled in the [employer] Program through [facility].  The patient presents today in association with an Advanced Heart Failure Clinic Appointment.  Patient living/home environment and social support-- Patient lives at home with his wife and dog.  Insurance/ Prescription Coverage-- Patient reports he has BC/BS although will be terminated soon. Patient has pending Medicare which will begin in November, 2017  Does the patient have a scale and weigh each day?yes Does the patient follow a low salt diet?yes Is patient compliant with medications? yes Does the patient have transportation for physician appointments? wife Does the patient contact the HF Clinic appropriately with worsening symptoms or weight increases? Patient verbalizes understanding  Do you have an Advanced Directive? considering  Are there any identified obstacles / challenges for adherence to current treatment plan?  Patient struggling with potential loss of insurance and what to do to bridge the gap until Medicare eligible. CSW will continue to work with patient and wife and coordinate services with paramedicine. Lasandra Beech, LCSW [phone]

## 2016-03-09 NOTE — Telephone Encounter (Signed)
Basic Metabolic Panel (BMET)  Order: [id number]  Status:  Final result  Visible to patient:  No (Not Released)  Dx:  Acute systolic heart failure (HCC)  Notes Recorded by Elvin So, RPH on 03/09/2016 at 12:59 PM EDT Attempted to reach patient and wife, no answer and no VM set up ------  Notes Recorded by Noralee Space, RN on 03/08/2016 at 5:00 PM EDT Per Tonye Becket, NP hold Lasix for 2 days then restart at 40 mg QOD alternating with 20 mg QOD, repeat bmet in week

## 2016-03-13 ENCOUNTER — Encounter (HOSPITAL_COMMUNITY): Payer: Self-pay | Admitting: *Deleted

## 2016-03-13 NOTE — Telephone Encounter (Signed)
Unable to reach patient.  No voicemail and non working number listed in patients chart. Mailed letter

## 2016-03-16 ENCOUNTER — Ambulatory Visit: Payer: Self-pay

## 2016-03-16 VITALS — BP 147/86 | HR 70

## 2016-03-16 DIAGNOSIS — R2681 Unsteadiness on feet: Secondary | ICD-10-CM

## 2016-03-16 DIAGNOSIS — R2689 Other abnormalities of gait and mobility: Secondary | ICD-10-CM

## 2016-03-16 NOTE — Therapy (Signed)
[facility] [address] [place], [place], [address] Phone: [phone]   Fax:  [phone]  Physical Therapy Treatment  Patient Details  Name: Eddie [name] MRN: [id number] Date of Birth: 1951/07/24 Referring Provider: Dr. Letta Pate  Encounter Date: 03/16/2016      PT End of Session - 03/16/16 1152    Visit Number 14   Number of Visits 20   Date for PT Re-Evaluation 03/03/16   Authorization Type BCBS-30 visit combo limit   Authorization - Visit Number 14   Authorization - Number of Visits 14   PT Start Time 1101   PT Stop Time 1147   PT Time Calculation (min) 46 min   Equipment Utilized During Treatment --  S for safety prn   Activity Tolerance Patient tolerated treatment well   Behavior During Therapy [facility] for tasks assessed/performed      Past Medical History:  Diagnosis Date  . CAD (coronary artery disease)    a. STEMI 09/2015 w/ DES to Prox LAD  . CVA (cerebral infarction)    a. 09/2015: acute small right frontal lobe infarct  . Diabetes ([place])   . Hypertension   . Ischemic cardiomyopathy   . PUD (peptic ulcer disease)     Past Surgical History:  Procedure Laterality Date  . CARDIAC CATHETERIZATION N/A 09/28/2015   Procedure: Left Heart Cath and Coronary Angiography;  Surgeon: Lorretta Harp, MD;  Location: [place] [facility];  Service: Cardiovascular;  Laterality: N/A;  . CARDIAC CATHETERIZATION N/A 10/08/2015   Procedure: Left Heart Cath and Coronary Angiography;  Surgeon: Belva Crome, MD;  Location: [place] [facility];  Service: Cardiovascular;  Laterality: N/A;  . Repair of Peptic Ulcer      Vitals:   03/16/16 1116 03/16/16 1139  BP: 132/86 (!) 147/86  Pulse: 67 70  SpO2:  98%        Subjective Assessment - 03/16/16 1108    Subjective Pt denied falls since last visit. pt reported MD incr. BP medication.   Patient is accompained by: Family member  [name]-wife   Pertinent History MI,  HTN, DM, CAD, L eye blindness. MD set BP parameters: 160/100   Patient Stated Goals Walk without cane, wash car without losing balance   Currently in Pain? No/denies            [facility] PT Assessment - 03/16/16 1129      Functional Gait  Assessment   Gait assessed  Yes   Gait Level Surface Walks 20 ft in less than 7 sec but greater than 5.5 sec, uses assistive device, slower speed, mild gait deviations, or deviates 6-10 in outside of the 12 in walkway width.  6.0sec.   Change in Gait Speed Able to change speed, demonstrates mild gait deviations, deviates 6-10 in outside of the 12 in walkway width, or no gait deviations, unable to achieve a major change in velocity, or uses a change in velocity, or uses an assistive device.   Gait with Horizontal Head Turns Performs head turns smoothly with slight change in gait velocity (eg, minor disruption to smooth gait path), deviates 6-10 in outside 12 in walkway width, or uses an assistive device.   Gait with Vertical Head Turns Performs task with slight change in gait velocity (eg, minor disruption to smooth gait path), deviates 6 - 10 in outside 12 in walkway width or uses assistive device   Gait and Pivot Turn Pivot turns safely within 3 sec and  stops quickly with no loss of balance.   Step Over Obstacle Is able to step over 2 stacked shoe boxes taped together (9 in total height) without changing gait speed. No evidence of imbalance.   Gait with Narrow Base of Support Is able to ambulate for 10 steps heel to toe with no staggering.   Gait with Eyes Closed Walks 20 ft, uses assistive device, slower speed, mild gait deviations, deviates 6-10 in outside 12 in walkway width. Ambulates 20 ft in less than 9 sec but greater than 7 sec.   Ambulating Backwards Walks 20 ft, uses assistive device, slower speed, mild gait deviations, deviates 6-10 in outside 12 in walkway width.   Steps Alternating feet, no rail.   Total Score 24                      [facility] Adult PT Treatment/Exercise - 03/16/16 1129      Ambulation/Gait   Ambulation/Gait Yes   Ambulation/Gait Assistance 6: Modified independent (Device/Increase time);7: Independent   Ambulation/Gait Assistance Details Pt amb. at slower speed while amb. over uneven terrain, otherwise, IND. Intermittent decr. L heel strike which pt self corrected.   Ambulation Distance (Feet) 1200 Feet   Assistive device None   Gait Pattern Step-through pattern;Decreased stride length;Trendelenburg;Decreased arm swing - left;Decreased dorsiflexion - left   Ambulation Surface Level;Unlevel;Indoor;Outdoor;Paved;Gravel;Grass           Self Care:     PT Education - 03/16/16 1148    Education provided Yes   Education Details PT reviewed HEP and educated pt use green theraband during squat/sidestepping in order to progress. PT provided pt with [facility] brochure and educated pt to have MD refer pt to [facility] once Medicare benefits begin in November. PT reiterated the importance of continuing HEP, going to local gym, and checking BP to ensure safety during activity.   Person(s) Educated Patient;Spouse   Methods Explanation;Verbal cues   Comprehension Verbalized understanding          PT Short Term Goals - 01/19/16 1647            PT Long Term Goals - 03/16/16 1153      PT LONG TERM GOAL #1   Title Pt will be IND in HEP to improve balance and strength. (New Target date: 03/03/16)   Baseline All unmet goals will be carried over to new POC: 01/27/16   Status Achieved     PT LONG TERM GOAL #2   Title Pt will improve FGA score to >/=25/30 to decr. falls risk. (New Target date: 03/03/16)   Baseline All unmet goals will be carried over to new POC: 01/27/16   Status Partially Met     PT LONG TERM GOAL #3   Title Pt will amb. 300' modified independent with LRAD, over uneven terrain (sand, grass), in order to improve functional mobility and to go to the beach this summer/fall. (New Target date: 03/03/16)    Baseline All unmet goals will be carried over to new POC: 01/27/16   Status Achieved     PT LONG TERM GOAL #4   Title Pt will amb. 800' over paved surfaces (even) modified independent, no AD, in order to walk dog and improve functional mobility. (New Target date: 03/03/16)   Baseline All unmet goals will be carried over to new POC: 01/27/16   Status Achieved     PT LONG TERM GOAL #5   Title Pt will verbalize walking program and potentially how  to progress to light jogging based on progress, in order to walk/jog with dog. Target date: 11/30/15   Baseline All unmet goals will be carried over to new POC: 01/27/16   Status Deferred               Plan - 03/16/16 1152    Clinical Impression Statement Pt met LTGs 1, 3, and 4. Pt partially met LTG 2. LTG 5 deferred, as pt's BP fluctuated during PT POC and running was not safe to attempt. Please see d/c summary for details.      Patient will benefit from skilled therapeutic intervention in order to improve the following deficits and impairments:     Visit Diagnosis: Other abnormalities of gait and mobility  Unsteadiness on feet     Problem List Patient Active Problem List   Diagnosis Date Noted  . Numbness and tingling in hands 02/11/2016  . Esophageal reflux   . Elevated troponin   . Cardiorenal syndrome   . HTN (hypertension), malignant   . Cerebrovascular accident (CVA) due to embolism of cerebral artery ([place])   . Acute systolic heart failure ([place])   . Acute on chronic combined systolic and diastolic heart failure ([place])   . Pulmonary edema   . Encounter for feeding tube placement   . Hypertensive emergency 12/20/2015  . UTI (urinary tract infection) 12/14/2015  . Bacteremia, escherichia coli 12/14/2015  . Respiratory failure ([place]) 12/11/2015  . Acute respiratory failure with hypercapnia ([place])   . Demand ischemia ([place])   . Gait disturbance, post-stroke 12/09/2015  . Cognitive deficit, post-stroke 12/09/2015  . Numbness and  tingling of both legs below knees 12/09/2015  . Long term (current) use of anticoagulants [Z79.01] 11/01/2015  . Ischemic stroke of frontal lobe ([place]) 10/15/2015  . Anoxic encephalopathy ([place]) 10/15/2015  . Right-sided cerebrovascular accident (CVA) ([place])   . Acute on chronic systolic congestive heart failure ([place])   . HCAP (healthcare-associated pneumonia)   . Dysphagia as late effect of cerebrovascular disease   . Tobacco abuse   . Tachypnea   . Essential hypertension   . Type 2 diabetes mellitus with complication ([place])   . Leukocytosis   . Absolute anemia   . Altered mental status   . Hemiplegia ([place])   . Stroke ([place])   . ARDS (adult respiratory distress syndrome) ([place])   . Acute respiratory failure ([place])   . STEMI (ST elevation myocardial infarction) ([place]) 09/28/2015  . Cardiac arrest ([place])   . ST elevation myocardial infarction (STEMI) ([place])   . Encounter for central line placement   . Hypokalemia   . Encephalopathy acute   . Acute hypoxemic respiratory failure ([place])     Kailyn Dubie L 03/16/2016, 11:56 AM  [facility] [address] [place], [place], [address] Phone: [phone]   Fax:  [phone]  Name: Eddie [name] MRN: [id number] Date of Birth: 1951/05/16  PHYSICAL THERAPY DISCHARGE SUMMARY  Visits from Start of Care: 14  Current functional level related to goals / functional outcomes:     PT Long Term Goals - 03/16/16 1153      PT LONG TERM GOAL #1   Title Pt will be IND in HEP to improve balance and strength. (New Target date: 03/03/16)   Baseline All unmet goals will be carried over to new POC: 01/27/16   Status Achieved     PT LONG TERM GOAL #2   Title Pt will improve FGA score to >/=25/30 to decr.  falls risk. (New Target date: 03/03/16)   Baseline All unmet goals will be carried over to new POC: 01/27/16   Status Partially Met     PT LONG TERM GOAL #3   Title Pt will amb. 300' modified  independent with LRAD, over uneven terrain (sand, grass), in order to improve functional mobility and to go to the beach this summer/fall. (New Target date: 03/03/16)   Baseline All unmet goals will be carried over to new POC: 01/27/16   Status Achieved     PT LONG TERM GOAL #4   Title Pt will amb. 800' over paved surfaces (even) modified independent, no AD, in order to walk dog and improve functional mobility. (New Target date: 03/03/16)   Baseline All unmet goals will be carried over to new POC: 01/27/16   Status Achieved     PT LONG TERM GOAL #5   Title Pt will verbalize walking program and potentially how to progress to light jogging based on progress, in order to walk/jog with dog. Target date: 11/30/15   Baseline All unmet goals will be carried over to new POC: 01/27/16   Status Deferred        Remaining deficits: Intermittent gait devaitions   Education / Equipment: HEP and THN information  Plan: Patient agrees to discharge.  Patient goals were met. Patient is being discharged due to meeting the stated rehab goals.  ?????       Geoffry Paradise, PT,DPT 03/16/16 11:56 AM Phone: [phone] Fax: [phone]

## 2016-03-22 NOTE — Addendum Note (Signed)
Encounter addended by: Dolores Patty, MD on: 03/22/2016 11:47 PM<BR>    Actions taken: Visit diagnoses modified, Sign clinical note

## 2016-03-24 ENCOUNTER — Ambulatory Visit: Payer: Self-pay | Admitting: Physical Medicine & Rehabilitation

## 2016-03-24 ENCOUNTER — Encounter: Payer: Self-pay | Admitting: Physical Medicine & Rehabilitation

## 2016-03-24 ENCOUNTER — Encounter: Payer: Self-pay | Attending: Physical Medicine & Rehabilitation

## 2016-03-24 VITALS — BP 172/108 | HR 69

## 2016-03-24 DIAGNOSIS — R208 Other disturbances of skin sensation: Secondary | ICD-10-CM

## 2016-03-24 DIAGNOSIS — R41841 Cognitive communication deficit: Secondary | ICD-10-CM | POA: Insufficient documentation

## 2016-03-24 DIAGNOSIS — I2510 Atherosclerotic heart disease of native coronary artery without angina pectoris: Secondary | ICD-10-CM | POA: Insufficient documentation

## 2016-03-24 DIAGNOSIS — R1313 Dysphagia, pharyngeal phase: Secondary | ICD-10-CM | POA: Insufficient documentation

## 2016-03-24 DIAGNOSIS — R498 Other voice and resonance disorders: Secondary | ICD-10-CM | POA: Insufficient documentation

## 2016-03-24 DIAGNOSIS — K279 Peptic ulcer, site unspecified, unspecified as acute or chronic, without hemorrhage or perforation: Secondary | ICD-10-CM | POA: Insufficient documentation

## 2016-03-24 NOTE — Patient Instructions (Signed)
You have evidence of neuropathy. This is likely related to your diabetes. It is not severe at this point, but if you keep your diabetes controlled, then it is less likely to progress.

## 2016-03-24 NOTE — Progress Notes (Signed)
EMG/NCV was performed today. 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

## 2016-03-30 MED FILL — SPIRONOLACTONE 25 MG TABLET: 25 | 30 days supply | Qty: 15 | Fill #1

## 2016-03-30 MED FILL — CLOPIDOGREL 75 MG TABLET: 75 | 30 days supply | Qty: 30 | Fill #1

## 2016-03-30 MED FILL — FUROSEMIDE 20 MG TABLET: 20 | 30 days supply | Qty: 60 | Fill #0

## 2016-04-03 ENCOUNTER — Telehealth (HOSPITAL_COMMUNITY): Payer: Self-pay | Admitting: Surgery

## 2016-04-03 NOTE — Telephone Encounter (Signed)
I spoke with [name] --HF Community Paramedic.  She has been unable to reach [name] after repeated tries over the last month.  We will remove him from the program at this time due to inability to make home visit appts.

## 2016-04-12 MED FILL — CARVEDILOL 12.5 MG TABLET: 12.5 | 30 days supply | Qty: 60 | Fill #1

## 2016-04-12 MED FILL — ATORVASTATIN 80 MG TABLET: 80 | 30 days supply | Qty: 30 | Fill #1

## 2016-04-13 ENCOUNTER — Other Ambulatory Visit: Payer: Self-pay | Admitting: Cardiology

## 2016-04-13 MED ORDER — INSULIN PEN NEEDLE 31G X 5 MM MISC: 1.0000 | Freq: Three times a day (TID) | 0 refills | Status: DC

## 2016-04-13 MED ORDER — SACUBITRIL-VALSARTAN 49-51 MG PO TABS: 1.0000 | ORAL_TABLET | Freq: Two times a day (BID) | ORAL | 5 refills | Status: DC

## 2016-04-14 ENCOUNTER — Telehealth (HOSPITAL_COMMUNITY): Payer: Self-pay | Admitting: Cardiology

## 2016-04-14 NOTE — Telephone Encounter (Signed)
Voicemail for someone tot return call regarding samples. Wife reports patient will need entresto samples as he is out of meds, will give patient enough meds to last until upcoming appt and discuss further concerns at that time    Medication Samples have been provided to the patient.  Drug name: entresto       Strength: 49/51 mg        Qty: 28  LOT: [id number]  Exp.Date: 01/2018  Dosing instructions: one tab bid   The patient has been instructed regarding the correct time, dose, and frequency of taking this medication, including desired effects and most common side effects.   Theresia Bough 10:26 AM 04/14/2016

## 2016-04-20 ENCOUNTER — Ambulatory Visit (HOSPITAL_COMMUNITY)
Admission: RE | Admit: 2016-04-20 | Discharge: 2016-04-20 | Disposition: A | Discharge status: Home / Self Care | Payer: Self-pay | Source: Ambulatory Visit | Attending: Internal Medicine | Admitting: Internal Medicine

## 2016-04-20 ENCOUNTER — Encounter (HOSPITAL_COMMUNITY): Payer: Self-pay | Admitting: Internal Medicine

## 2016-04-20 VITALS — BP 126/66 | HR 78 | Wt 158.0 lb

## 2016-04-20 DIAGNOSIS — I255 Ischemic cardiomyopathy: Secondary | ICD-10-CM | POA: Insufficient documentation

## 2016-04-20 DIAGNOSIS — Z9119 Patient's noncompliance with other medical treatment and regimen: Secondary | ICD-10-CM | POA: Insufficient documentation

## 2016-04-20 DIAGNOSIS — Z8711 Personal history of peptic ulcer disease: Secondary | ICD-10-CM | POA: Insufficient documentation

## 2016-04-20 DIAGNOSIS — I2510 Atherosclerotic heart disease of native coronary artery without angina pectoris: Secondary | ICD-10-CM | POA: Insufficient documentation

## 2016-04-20 DIAGNOSIS — J9600 Acute respiratory failure, unspecified whether with hypoxia or hypercapnia: Secondary | ICD-10-CM | POA: Insufficient documentation

## 2016-04-20 DIAGNOSIS — Z8673 Personal history of transient ischemic attack (TIA), and cerebral infarction without residual deficits: Secondary | ICD-10-CM | POA: Insufficient documentation

## 2016-04-20 DIAGNOSIS — I252 Old myocardial infarction: Secondary | ICD-10-CM | POA: Insufficient documentation

## 2016-04-20 DIAGNOSIS — E119 Type 2 diabetes mellitus without complications: Secondary | ICD-10-CM | POA: Insufficient documentation

## 2016-04-20 DIAGNOSIS — Z7982 Long term (current) use of aspirin: Secondary | ICD-10-CM | POA: Insufficient documentation

## 2016-04-20 DIAGNOSIS — F17210 Nicotine dependence, cigarettes, uncomplicated: Secondary | ICD-10-CM | POA: Insufficient documentation

## 2016-04-20 DIAGNOSIS — I110 Hypertensive heart disease with heart failure: Secondary | ICD-10-CM | POA: Insufficient documentation

## 2016-04-20 DIAGNOSIS — Z95828 Presence of other vascular implants and grafts: Secondary | ICD-10-CM | POA: Insufficient documentation

## 2016-04-20 DIAGNOSIS — I10 Essential (primary) hypertension: Secondary | ICD-10-CM

## 2016-04-20 DIAGNOSIS — I5022 Chronic systolic (congestive) heart failure: Secondary | ICD-10-CM | POA: Insufficient documentation

## 2016-04-20 DIAGNOSIS — Z794 Long term (current) use of insulin: Secondary | ICD-10-CM | POA: Insufficient documentation

## 2016-04-20 LAB — BASIC METABOLIC PANEL
Anion gap: 11 (ref 5–15)
BUN: 11 mg/dL (ref 6–20)
CALCIUM: 8.9 mg/dL (ref 8.9–10.3)
CHLORIDE: 101 mmol/L (ref 101–111)
CO2: 26 mmol/L (ref 22–32)
CREATININE: 1.5 mg/dL — AB (ref 0.61–1.24)
GFR calc non Af Amer: 47 mL/min — ABNORMAL LOW (ref >60)
GFR, EST AFRICAN AMERICAN: 55 mL/min — AB (ref >60)
Glucose, Bld: 375 mg/dL — ABNORMAL HIGH (ref 65–99)
Potassium: 3.5 mmol/L (ref 3.5–5.1)
SODIUM: 138 mmol/L (ref 135–145)

## 2016-04-20 NOTE — Patient Instructions (Signed)
Routine lab work today. Will notify you of abnormal results   Follow up with Dr.Bensimhon in 1 month

## 2016-04-20 NOTE — Progress Notes (Addendum)
ADVANCED HF CLINIC CONSULT NOTE    HPI:  Eddie [name] is a 65 y/o male with h/o DM2, CAD s/p NSTEMI with LAD stent in 3/17, CVA, chronic systolic HF with EF 97-98%.   Had multiple admissions in May and June 2017 in the setting of decompensated CHF and EColi bacteremia. Readmitted in June 2017 with acute respiratory failure requiring intubation in setting of severe HTN and ADHF. Treated with milrinone which was eventually weaned off.    Return for post-hospital f/u. Feels good. Going to neuro rehab. Had a fall last week when he got up out of bed,   We saw him last month and he was out of many of his medicines. We tried to simplify his regimen and put him on carvedilol 6.25 mg BID, Entresto 49-51 mg BID and spironolactone 12.5 mg daily and lasix 72m daily. Seen in PharmD clinic on 8/16 felt to be ovediuresed. Want to decrease lasix but could not get a hold of him by phone for that or Paramedicine  Feels pretty good. Able to do all activities without problem. Weighs every usually 156-157. Occasionally up to 159. Taking medicines as prescribed. About to run out of some meds. Will get Medicaid November 1.   Echo 5/15 EF 15-20%   Past Medical History:  Diagnosis Date  . CAD (coronary artery disease)    a. STEMI 09/2015 w/ DES to Prox LAD  . CVA (cerebral infarction)    a. 09/2015: acute small right frontal lobe infarct  . Diabetes ([place]   . Hypertension   . Ischemic cardiomyopathy   . PUD (peptic ulcer disease)     Current Outpatient Prescriptions  Medication Sig Dispense Refill  . aspirin 81 MG tablet Take 81 mg by mouth daily.    .Marland Kitchenatorvastatin (LIPITOR) 80 MG tablet Take 1 tablet (80 mg total) by mouth daily at 6 PM. 30 tablet 5  . blood glucose meter kit and supplies KIT Dispense based on patient and insurance preference. Use up to four times daily as directed. (FOR ICD-9 250.00, 250.01). 1 each 0  . carvedilol (COREG) 12.5 MG tablet Take 1 tablet (12.5 mg total) by mouth 2 (two)  times daily. 60 tablet 5  . clopidogrel (PLAVIX) 75 MG tablet Take 1 tablet (75 mg total) by mouth daily. 30 tablet 2  . furosemide (LASIX) 40 MG tablet Take 1 tablet (40 mg total) by mouth daily. 30 tablet 2  . Insulin Glargine (LANTUS) 100 UNIT/ML Solostar Pen Inject 10 Units into the skin daily at 10 pm. 15 mL 0  . Insulin Pen Needle 31G X 5 MM MISC 1 each by Does not apply route 3 (three) times daily before meals. 90 each 0  . ipratropium-albuterol (DUONEB) 0.5-2.5 (3) MG/3ML SOLN Take 3 mLs by nebulization every 6 (six) hours as needed (shortness of breath).    . sacubitril-valsartan (ENTRESTO) 49-51 MG Take 1 tablet by mouth 2 (two) times daily. 60 tablet 5  . spironolactone (ALDACTONE) 25 MG tablet Take 0.5 tablets (12.5 mg total) by mouth daily. 15 tablet 2  . nitroGLYCERIN (NITROSTAT) 0.4 MG SL tablet Use one pill every 5 minutes X 3 if needed for chest pain. Contact MD if pain does not improve (Patient not taking: Reported on 04/20/2016) 30 tablet 0   No current facility-administered medications for this encounter.     No Known Allergies    Social History   Social History  . Marital status: Married    Spouse name:  N/A  . Number of children: N/A  . Years of education: N/A   Occupational History  . Not on file.   Social History Main Topics  . Smoking status: Current Every Day Smoker    Packs/day: 0.25  . Smokeless tobacco: Current User  . Alcohol use No  . Drug use: Unknown  . Sexual activity: Not on file   Other Topics Concern  . Not on file   Social History Narrative  . No narrative on file     No family history on file.  Vitals:   04/20/16 1451  BP: 126/66  Pulse: 78  SpO2: 100%  Weight: 158 lb (71.7 kg)    PHYSICAL EXAM: General:  Thin Well appearing. No respiratory difficulty HEENT: normal Neck: supple. JVP 6 Carotids 2+ bilat; no bruits. No lymphadenopathy or thryomegaly appreciated. Cor: PMI laterally displaced. Regular rate & rhythm. No rubs,  gallops. Soft SEM at apex Lungs: clear Abdomen: soft, nontender, nondistended. No hepatosplenomegaly. No bruits or masses. Good bowel sounds. Extremities: no cyanosis, clubbing, rash, no  edema Neuro: alert & oriented x 3, cranial nerves grossly intact. moves all 4 extremities w/o difficulty. Affect pleasant.  ASSESSMENT & PLAN: 1. Chronicsystolic CHF (EF 52-84%), due to ICM.  - NYHA class IIsymptoms. Volume status looks great.  - He is getting much better with his meds. We will give him a month's supply of current meds and see him back soon. Will not titrate further until he has Medicaid  - Continue carvedilol to 12.5 mg BID and Entresto 49-51 mg BID,spironolactone 12.5 mg daily - Lasix 20 mg bid  - Reinforced need for daily weights and reviewed use of sliding scale diuretics. - Will f/u PharmD at next visit - Stopped Lifevest.  If EF remains down after complying with GDMT consider ICD -Repeat echo in 1-2 months 2. CAD  - Continue ASA 81 mg daily, statin, b-blocker - Now off coumadin. Will continue clopidogrel 75 mg daily,  3. HTN --BP well controlled  3. CVA, presumed cardi-embolic - Coumadin stopped due to non compliance. Treat with ASA, plavix and statin 4. DMII - Continue Lantus per PCP - On statin and ARNI  Bensimhon, Daniel,MD 3:07 PM

## 2016-04-27 ENCOUNTER — Ambulatory Visit: Payer: Self-pay | Admitting: Physical Medicine & Rehabilitation

## 2016-04-27 DIAGNOSIS — R1313 Dysphagia, pharyngeal phase: Secondary | ICD-10-CM | POA: Insufficient documentation

## 2016-04-27 DIAGNOSIS — R41841 Cognitive communication deficit: Secondary | ICD-10-CM | POA: Insufficient documentation

## 2016-04-27 DIAGNOSIS — K279 Peptic ulcer, site unspecified, unspecified as acute or chronic, without hemorrhage or perforation: Secondary | ICD-10-CM | POA: Insufficient documentation

## 2016-04-27 DIAGNOSIS — I2510 Atherosclerotic heart disease of native coronary artery without angina pectoris: Secondary | ICD-10-CM | POA: Insufficient documentation

## 2016-04-27 DIAGNOSIS — R498 Other voice and resonance disorders: Secondary | ICD-10-CM | POA: Insufficient documentation

## 2016-04-27 MED FILL — SPIRONOLACTONE 25 MG TABLET: 25 | 30 days supply | Qty: 15 | Fill #2

## 2016-04-27 MED FILL — FUROSEMIDE 20 MG TABLET: 20 | 30 days supply | Qty: 60 | Fill #1

## 2016-04-27 MED FILL — CLOPIDOGREL 75 MG TABLET: 75 | 30 days supply | Qty: 30 | Fill #2

## 2016-05-03 ENCOUNTER — Encounter: Payer: Self-pay | Admitting: Registered Nurse

## 2016-05-03 ENCOUNTER — Encounter: Payer: Self-pay | Attending: Physical Medicine & Rehabilitation | Admitting: Registered Nurse

## 2016-05-03 VITALS — BP 126/83 | HR 73

## 2016-05-03 DIAGNOSIS — I639 Cerebral infarction, unspecified: Secondary | ICD-10-CM

## 2016-05-03 DIAGNOSIS — R269 Unspecified abnormalities of gait and mobility: Secondary | ICD-10-CM

## 2016-05-03 DIAGNOSIS — I69398 Other sequelae of cerebral infarction: Secondary | ICD-10-CM

## 2016-05-03 NOTE — Progress Notes (Signed)
Subjective:    Patient ID: Eddie [name], male    DOB: [birth year], 65 y.o.   MRN: [id number]  HPI: [name] is a 65 year old male who returns for follow up appointment for Ischemic Stroke of Frontal Lobe. He denies any pain. His current exercise regime is walking and performing stretching exercises.   Also states for years he has had a bump on his upper back , small lump noted, no drainage. He will follow up with his PCP.  Wife in room all questions answered.    Pain Inventory Average Pain 1 Pain Right Now 0 My pain is tingling  In the last 24 hours, has pain interfered with the following? General activity 0 Relation with others 1 Enjoyment of life 1 What TIME of day is your pain at its worst? morning Sleep (in general) Good  Pain is worse with: unsure Pain improves with: medication Relief from Meds: 0  Mobility use a cane how many minutes can you walk? 15 ability to climb steps?  yes do you drive?  no  Function disabled: date disabled n/a I need assistance with the following:  meal prep and shopping  Neuro/Psych No problems in this area  Prior Studies Any changes since last visit?  no  Physicians involved in your care Any changes since last visit?  no   No family history on file. Social History   Social History  . Marital status: Married    Spouse name: N/A  . Number of children: N/A  . Years of education: N/A   Social History Main Topics  . Smoking status: Current Every Day Smoker    Packs/day: 0.25  . Smokeless tobacco: Current User  . Alcohol use No  . Drug use: Unknown  . Sexual activity: Not on file   Other Topics Concern  . Not on file   Social History Narrative  . No narrative on file   Past Surgical History:  Procedure Laterality Date  . CARDIAC CATHETERIZATION N/A 09/28/2015   Procedure: Left Heart Cath and Coronary Angiography;  Surgeon: Runell Gess, MD;  Location: [facility] INVASIVE CV LAB;  Service: Cardiovascular;  Laterality:  N/A;  . CARDIAC CATHETERIZATION N/A 10/08/2015   Procedure: Left Heart Cath and Coronary Angiography;  Surgeon: Lyn Records, MD;  Location: [facility] INVASIVE CV LAB;  Service: Cardiovascular;  Laterality: N/A;  . Repair of Peptic Ulcer     Past Medical History:  Diagnosis Date  . CAD (coronary artery disease)    a. STEMI 09/2015 w/ DES to Prox LAD  . CVA (cerebral infarction)    a. 09/2015: acute small right frontal lobe infarct  . Diabetes (HCC)   . Hypertension   . Ischemic cardiomyopathy   . PUD (peptic ulcer disease)    There were no vitals taken for this visit.  Opioid Risk Score:   Fall Risk Score:  `1  Depression screen PHQ 2/9  Depression screen PHQ 2/9 11/09/2015  Decreased Interest 0  Down, Depressed, Hopeless 0  PHQ - 2 Score 0    Review of Systems  All other systems reviewed and are negative.      Objective:   Physical Exam  Constitutional: He is oriented to person, place, and time. He appears well-developed and well-nourished.  HENT:  Head: Normocephalic and atraumatic.  Neck: Normal range of motion. Neck supple.  Cardiovascular: Normal rate and regular rhythm.   Pulmonary/Chest: Effort normal and breath sounds normal.  Musculoskeletal:  Normal Muscle Bulk  and Muscle Testing Reveals: Upper Extremities: Full ROM and Muscle Strength 5/5 Lower Extremities: Full ROM and Muscle Strength 5/5 Arises from Table with ease using 4 prong cane for support Narrow Based Gait  Neurological: He is alert and oriented to person, place, and time.  Skin: Skin is warm and dry.  Psychiatric: He has a normal mood and affect.  Nursing note and vitals reviewed.         Assessment & Plan:  1. History of right frontal infarct after anterior myocardial infarction, post emergent catheterization.  Continue HEP and Cognitive exercises. Continue  to Monitor.   Follow up in 2 months.

## 2016-05-15 MED FILL — ATORVASTATIN 80 MG TABLET: 80 | 30 days supply | Qty: 30 | Fill #2

## 2016-05-15 MED FILL — CARVEDILOL 12.5 MG TABLET: 12.5 | 30 days supply | Qty: 60 | Fill #2

## 2016-05-16 ENCOUNTER — Telehealth (HOSPITAL_COMMUNITY): Payer: Self-pay | Admitting: Vascular Surgery

## 2016-05-16 NOTE — Telephone Encounter (Signed)
All numbers in system are invail , called wife pt cell phone no voicemail phone rings and hangs up.. Will send pt letter to resch appt

## 2016-05-23 ENCOUNTER — Ambulatory Visit: Payer: Self-pay | Admitting: Pharmacist Clinician (PhC)/ Clinical Pharmacy Specialist

## 2016-05-23 DIAGNOSIS — I63311 Cerebral infarction due to thrombosis of right middle cerebral artery: Secondary | ICD-10-CM

## 2016-05-24 ENCOUNTER — Encounter (HOSPITAL_COMMUNITY): Payer: Self-pay | Admitting: Internal Medicine

## 2016-05-29 ENCOUNTER — Other Ambulatory Visit (HOSPITAL_COMMUNITY): Payer: Self-pay | Admitting: Internal Medicine

## 2016-05-30 MED FILL — FUROSEMIDE 20 MG TABLET: 20 | 30 days supply | Qty: 60 | Fill #0

## 2016-05-30 MED FILL — CLOPIDOGREL 75 MG TABLET: 75 | 30 days supply | Qty: 30 | Fill #0

## 2016-05-30 MED FILL — SPIRONOLACTONE 25 MG TABLET: 25 | 30 days supply | Qty: 15 | Fill #0

## 2016-05-31 ENCOUNTER — Other Ambulatory Visit (HOSPITAL_COMMUNITY): Payer: Self-pay | Admitting: *Deleted

## 2016-05-31 ENCOUNTER — Telehealth (HOSPITAL_COMMUNITY): Payer: Self-pay | Admitting: Vascular Surgery

## 2016-05-31 MED ORDER — ASPIRIN EC 81 MG PO TBEC: 81.0000 mg | DELAYED_RELEASE_TABLET | Freq: Every day | ORAL | 3 refills | Status: DC

## 2016-05-31 NOTE — Telephone Encounter (Signed)
Pt needs refill of aspirin

## 2016-06-05 ENCOUNTER — Other Ambulatory Visit (HOSPITAL_COMMUNITY): Payer: Self-pay | Admitting: *Deleted

## 2016-06-05 MED ORDER — SACUBITRIL-VALSARTAN 49-51 MG PO TABS: 1.0000 | ORAL_TABLET | Freq: Two times a day (BID) | ORAL | 5 refills | Status: DC

## 2016-06-07 ENCOUNTER — Ambulatory Visit (HOSPITAL_COMMUNITY): Admission: RE | Admit: 2016-06-07 | Payer: Medicare HMO | Source: Ambulatory Visit

## 2016-06-14 ENCOUNTER — Encounter (HOSPITAL_COMMUNITY): Payer: Self-pay | Admitting: Internal Medicine

## 2016-06-14 MED FILL — ATORVASTATIN 80 MG TABLET: 80 | 30 days supply | Qty: 30 | Fill #3

## 2016-06-14 MED FILL — CARVEDILOL 12.5 MG TABLET: 12.5 | 30 days supply | Qty: 60 | Fill #3

## 2016-06-16 ENCOUNTER — Ambulatory Visit (HOSPITAL_COMMUNITY): Payer: Medicare HMO

## 2016-06-19 ENCOUNTER — Ambulatory Visit (HOSPITAL_COMMUNITY)
Admission: RE | Admit: 2016-06-19 | Discharge: 2016-06-19 | Disposition: A | Discharge status: Home / Self Care | Payer: Medicare HMO | Source: Ambulatory Visit | Attending: Internal Medicine | Admitting: Internal Medicine

## 2016-06-19 DIAGNOSIS — I429 Cardiomyopathy, unspecified: Secondary | ICD-10-CM | POA: Insufficient documentation

## 2016-06-19 DIAGNOSIS — I259 Chronic ischemic heart disease, unspecified: Secondary | ICD-10-CM | POA: Insufficient documentation

## 2016-06-19 DIAGNOSIS — E119 Type 2 diabetes mellitus without complications: Secondary | ICD-10-CM | POA: Diagnosis not present

## 2016-06-19 DIAGNOSIS — I219 Acute myocardial infarction, unspecified: Secondary | ICD-10-CM | POA: Insufficient documentation

## 2016-06-19 DIAGNOSIS — F17200 Nicotine dependence, unspecified, uncomplicated: Secondary | ICD-10-CM | POA: Insufficient documentation

## 2016-06-19 DIAGNOSIS — I340 Nonrheumatic mitral (valve) insufficiency: Secondary | ICD-10-CM | POA: Diagnosis not present

## 2016-06-19 DIAGNOSIS — I2510 Atherosclerotic heart disease of native coronary artery without angina pectoris: Secondary | ICD-10-CM | POA: Diagnosis not present

## 2016-06-19 DIAGNOSIS — I110 Hypertensive heart disease with heart failure: Secondary | ICD-10-CM | POA: Diagnosis not present

## 2016-06-19 DIAGNOSIS — I5023 Acute on chronic systolic (congestive) heart failure: Secondary | ICD-10-CM | POA: Diagnosis not present

## 2016-06-19 LAB — ECHOCARDIOGRAM COMPLETE
CHL CUP STROKE VOLUME: 27 mL
E/e' ratio: 9.98
EWDT: 218 ms
FS: 7 % — AB (ref 28–44)
IVS/LV PW RATIO, ED: 0.86
LA diam end sys: 35 mm
LA vol: 69.7 mL
LADIAMINDEX: 1.93 cm/m2
LASIZE: 35 mm
LAVOLA4C: 64.2 mL
LAVOLIN: 38.5 mL/m2
LDCA: 4.91 cm2
LV E/e' medial: 9.98
LV E/e'average: 9.98
LV PW d: 14.6 mm — AB (ref 0.6–1.1)
LV TDI E'MEDIAL: 5.08
LV e' LATERAL: 5.82 cm/s
LV sys vol: 95 mL — AB (ref 21–61)
LVDIAVOL: 122 mL (ref 62–150)
LVDIAVOLIN: 67 mL/m2
LVOT SV: 59 mL
LVOT VTI: 12.1 cm
LVOT peak vel: 58.7 cm/s
LVOTD: 25 mm
LVSYSVOLIN: 53 mL/m2
MV Dec: 218
MV pk A vel: 97.3 m/s
MVPKEVEL: 58.1 m/s
RV LATERAL S' VELOCITY: 10.6 cm/s
RV TAPSE: 27.7 mm
Simpson's disk: 22
TDI e' lateral: 5.82

## 2016-06-19 NOTE — Progress Notes (Signed)
  Echocardiogram 2D Echocardiogram has been performed.  Eddie [name] 06/19/2016, 11:17 AM

## 2016-06-22 ENCOUNTER — Ambulatory Visit (HOSPITAL_COMMUNITY)
Admission: RE | Admit: 2016-06-22 | Discharge: 2016-06-22 | Disposition: A | Discharge status: Home / Self Care | Payer: Medicare HMO | Source: Ambulatory Visit | Attending: Internal Medicine | Admitting: Internal Medicine

## 2016-06-22 ENCOUNTER — Encounter (HOSPITAL_COMMUNITY): Payer: Self-pay | Admitting: *Deleted

## 2016-06-22 ENCOUNTER — Encounter (HOSPITAL_COMMUNITY): Payer: Self-pay | Admitting: Internal Medicine

## 2016-06-22 VITALS — BP 142/84 | HR 82 | Wt 160.0 lb

## 2016-06-22 DIAGNOSIS — Z794 Long term (current) use of insulin: Secondary | ICD-10-CM | POA: Insufficient documentation

## 2016-06-22 DIAGNOSIS — Z8711 Personal history of peptic ulcer disease: Secondary | ICD-10-CM | POA: Insufficient documentation

## 2016-06-22 DIAGNOSIS — I255 Ischemic cardiomyopathy: Secondary | ICD-10-CM | POA: Insufficient documentation

## 2016-06-22 DIAGNOSIS — I2510 Atherosclerotic heart disease of native coronary artery without angina pectoris: Secondary | ICD-10-CM | POA: Diagnosis not present

## 2016-06-22 DIAGNOSIS — F17210 Nicotine dependence, cigarettes, uncomplicated: Secondary | ICD-10-CM | POA: Diagnosis not present

## 2016-06-22 DIAGNOSIS — I252 Old myocardial infarction: Secondary | ICD-10-CM | POA: Diagnosis not present

## 2016-06-22 DIAGNOSIS — B9620 Unspecified Escherichia coli [E. coli] as the cause of diseases classified elsewhere: Secondary | ICD-10-CM | POA: Diagnosis not present

## 2016-06-22 DIAGNOSIS — E119 Type 2 diabetes mellitus without complications: Secondary | ICD-10-CM | POA: Insufficient documentation

## 2016-06-22 DIAGNOSIS — I110 Hypertensive heart disease with heart failure: Secondary | ICD-10-CM | POA: Diagnosis not present

## 2016-06-22 DIAGNOSIS — Z8673 Personal history of transient ischemic attack (TIA), and cerebral infarction without residual deficits: Secondary | ICD-10-CM | POA: Diagnosis not present

## 2016-06-22 DIAGNOSIS — I5022 Chronic systolic (congestive) heart failure: Secondary | ICD-10-CM | POA: Diagnosis present

## 2016-06-22 DIAGNOSIS — I5021 Acute systolic (congestive) heart failure: Secondary | ICD-10-CM

## 2016-06-22 DIAGNOSIS — Z7982 Long term (current) use of aspirin: Secondary | ICD-10-CM | POA: Insufficient documentation

## 2016-06-22 MED ORDER — ISOSORBIDE MONONITRATE ER 60 MG PO TB24: 60.0000 mg | ORAL_TABLET | Freq: Every day | ORAL | 3 refills | Status: DC

## 2016-06-22 MED ORDER — ISOSORB DINITRATE-HYDRALAZINE 20-37.5 MG PO TABS: 1.0000 | ORAL_TABLET | Freq: Three times a day (TID) | ORAL | 3 refills | Status: DC

## 2016-06-22 MED ORDER — HYDRALAZINE HCL 25 MG PO TABS: 37.5000 mg | ORAL_TABLET | Freq: Three times a day (TID) | ORAL | 3 refills | Status: DC

## 2016-06-22 NOTE — Progress Notes (Signed)
ADVANCED HF CLINIC NOTE    HPI:  Mr. Kelly is a 65 y/o male with h/o DM2, CAD s/p NSTEMI with LAD stent in 3/17, CVA, chronic systolic HF with EF 32-44%.   Had multiple admissions in May and June 2017 in the setting of decompensated CHF and EColi bacteremia. Readmitted in June 2017 with acute respiratory failure requiring intubation in setting of severe HTN and ADHF. Treated with milrinone which was eventually weaned off.    Return for post-hospital f/u. Feels good. Going to neuro rehab. Had a fall last week when he got up out of bed,   We saw him last month and he was out of many of his medicines. We tried to simplify his regimen and put him on carvedilol 6.25 mg BID, Entresto 49-51 mg BID and spironolactone 12.5 mg daily and lasix 59m daily. Seen in PharmD clinic on 8/16 felt to be ovediuresed. Want to decrease lasix but could not get a hold of him by phone for that or Paramedicine  Overall feeling pretty good. Mid dyspnea with brisk walking. Weight at home 157-158 pounds. Ambulates with cane.  Taking all medications. Medicaid approved.  Smokes 1 cigarette every other day.   ECHO 06/19/2016: EF 20-25%.Cannot exclude small  laminar apical thrombus. No mobile or protuberant thrombus is  seen. Echo 5/15 EF 15-20%   Past Medical History:  Diagnosis Date  . CAD (coronary artery disease)    a. STEMI 09/2015 w/ DES to Prox LAD  . CVA (cerebral infarction)    a. 09/2015: acute small right frontal lobe infarct  . Diabetes ([facility]   . Hypertension   . Ischemic cardiomyopathy   . PUD (peptic ulcer disease)     Current Outpatient Prescriptions  Medication Sig Dispense Refill  . aspirin EC 81 MG tablet Take 1 tablet (81 mg total) by mouth daily. 90 tablet 3  . atorvastatin (LIPITOR) 80 MG tablet Take 1 tablet (80 mg total) by mouth daily at 6 PM. 30 tablet 5  . blood glucose meter kit and supplies KIT Dispense based on patient and insurance preference. Use up to four times daily as  directed. (FOR ICD-9 250.00, 250.01). 1 each 0  . carvedilol (COREG) 12.5 MG tablet Take 1 tablet (12.5 mg total) by mouth 2 (two) times daily. 60 tablet 5  . clopidogrel (PLAVIX) 75 MG tablet TAKE 1 TABLET (75 MG TOTAL) BY MOUTH DAILY. 30 tablet 6  . furosemide (LASIX) 20 MG tablet TAKE 2 TABLETS (40 MG) BY MOUTH ONCE DAILY 60 tablet 6  . Insulin Glargine (LANTUS) 100 UNIT/ML Solostar Pen Inject 10 Units into the skin daily at 10 pm. 15 mL 0  . Insulin Pen Needle 31G X 5 MM MISC 1 each by Does not apply route 3 (three) times daily before meals. 90 each 0  . ipratropium-albuterol (DUONEB) 0.5-2.5 (3) MG/3ML SOLN Take 3 mLs by nebulization every 6 (six) hours as needed (shortness of breath).    . nitroGLYCERIN (NITROSTAT) 0.4 MG SL tablet Use one pill every 5 minutes X 3 if needed for chest pain. Contact MD if pain does not improve 30 tablet 0  . sacubitril-valsartan (ENTRESTO) 49-51 MG Take 1 tablet by mouth 2 (two) times daily. 60 tablet 5  . spironolactone (ALDACTONE) 25 MG tablet Take 0.5 tablets (12.5 mg total) by mouth daily. 15 tablet 2   No current facility-administered medications for this encounter.     No Known Allergies    Social History  Social History  . Marital status: Married    Spouse name: N/A  . Number of children: N/A  . Years of education: N/A   Occupational History  . Not on file.   Social History Main Topics  . Smoking status: Light Tobacco Smoker    Packs/day: 0.25  . Smokeless tobacco: Current User  . Alcohol use No  . Drug use: No  . Sexual activity: Not on file   Other Topics Concern  . Not on file   Social History Narrative  . No narrative on file     No family history on file.  Vitals:   06/22/16 1002  BP: (!) 142/84  Pulse: 82  SpO2: 99%  Weight: 160 lb (72.6 kg)    PHYSICAL EXAM: General:  Thin Well appearing. No respiratory difficulty HEENT: normal Neck: supple. JVP 5-6 Carotids 2+ bilat; no bruits. No lymphadenopathy or  thryomegaly appreciated. Cor: PMI laterally displaced. Regular rate & rhythm. No rubs, gallops. Soft SEM at apex Lungs: clear Abdomen: soft, nontender, nondistended. No hepatosplenomegaly. No bruits or masses. Good bowel sounds. Extremities: no cyanosis, clubbing, rash, no  edema Neuro: alert & oriented x 3, cranial nerves grossly intact. moves all 4 extremities w/o difficulty. Affect pleasant.  ASSESSMENT & PLAN: 1. Chronicsystolic CHF (May 3353 EF 15-20% and November 27 EF 25%  due to ICM.   - NYHA class IIsymptoms. Volume status looks great.  -  Continue carvedilol to 12.5 mg BID and Entresto 49-51 mg BID,spironolactone 12.5 mg daily - Add bidil 1 tab tid.  - Continue Lasix 20 mg bid  - Reinforced need for daily weights and reviewed use of sliding scale diuretics. - Stopped Lifevest.  If EF remains down after complying with GDMT consider ICD Repeat ECHO with difinity.  Will need CPX set up at the next visit.  2. CAD  - Continue ASA 81 mg daily, statin, b-blocker - Now off coumadin. Will continue clopidogrel 75 mg daily,  3. HTN --BP up. 3. CVA, presumed cardi-embolic - Coumadin stopped due to non compliance. Treat with ASA, plavix and statin 4. DMII - Continue Lantus per PCP - On statin and ARNI  Follow up in 4 weeks.   Darrick Grinder NP-C  10:54 AM  Patient seen and examined with Darrick Grinder, NP. We discussed all aspects of the encounter. I agree with the assessment and plan as stated above.   Overall doing well despite persistent severe LV dysfunction. Volume status looks good. Agree with adding Bidil. I reviewed echo images personally and do not see obvious LV clot but agree with Definity study to further assess.   Adel Neyer,MD 7:28 PM

## 2016-06-22 NOTE — Progress Notes (Signed)
Patient stated that he was in need of assistance with [facility] copay. I have enrolled him in [employer] so that he will have $800 toward his copays through 06/21/17. When relaying info to Outpatient Pharmacy they stated that his copay was $8/mo so [employer] will not cover the copay cost unless it is > $25/mo.   Member ID: [id number] Group ID: [id number] RxBin ID: [id number] PCN: [id number] Eligibility Start Date: 03/24/2016 Eligibility End Date: 06/21/2017  Tyler Deis. Bonnye Fava, PharmD, BCPS, CPP Clinical Pharmacist Pager: [phone] Phone: [phone] 06/22/2016 10:48 AM

## 2016-06-22 NOTE — Patient Instructions (Signed)
Your provider requests you have an echocardiogram before your follow up.  Follow up with Dr.Bensimhon in 3-4 weeks.  Start Bidil 1 tablet three times daily.

## 2016-06-27 ENCOUNTER — Telehealth: Payer: Self-pay | Admitting: Licensed Clinical Social Worker

## 2016-06-27 MED FILL — FUROSEMIDE 20 MG TABLET: 20 | 30 days supply | Qty: 60 | Fill #1

## 2016-06-27 MED FILL — CLOPIDOGREL 75 MG TABLET: 75 | 30 days supply | Qty: 30 | Fill #1

## 2016-06-27 MED FILL — SPIRONOLACTONE 25 MG TABLET: 25 | 30 days supply | Qty: 15 | Fill #1

## 2016-06-27 NOTE — Telephone Encounter (Signed)
SW Intern was referred to pt to find PCP. SW called pt and received no answer, SW Intern left message and told pt to call back at earliest convenience. Rolland Porter SW Intern, [place], [place] [phone]

## 2016-06-29 ENCOUNTER — Ambulatory Visit (HOSPITAL_COMMUNITY)
Admission: RE | Admit: 2016-06-29 | Discharge: 2016-06-29 | Disposition: A | Discharge status: Home / Self Care | Payer: Medicare HMO | Source: Ambulatory Visit | Attending: Internal Medicine | Admitting: Internal Medicine

## 2016-06-29 DIAGNOSIS — I2510 Atherosclerotic heart disease of native coronary artery without angina pectoris: Secondary | ICD-10-CM | POA: Insufficient documentation

## 2016-06-29 DIAGNOSIS — I255 Ischemic cardiomyopathy: Secondary | ICD-10-CM | POA: Diagnosis not present

## 2016-06-29 DIAGNOSIS — I5021 Acute systolic (congestive) heart failure: Secondary | ICD-10-CM | POA: Insufficient documentation

## 2016-06-29 DIAGNOSIS — E119 Type 2 diabetes mellitus without complications: Secondary | ICD-10-CM | POA: Insufficient documentation

## 2016-06-29 DIAGNOSIS — I110 Hypertensive heart disease with heart failure: Secondary | ICD-10-CM | POA: Diagnosis not present

## 2016-06-29 MED ORDER — PERFLUTREN LIPID MICROSPHERE
1.0000 mL | INTRAVENOUS | Status: AC | PRN
  Administered 2016-06-29: 2 mL via INTRAVENOUS
  Filled 2016-06-29: qty 10

## 2016-06-29 MED ORDER — SODIUM CHLORIDE 0.9 % IV SOLN
INTRAVENOUS | Status: DC
  Administered 2016-06-29: 50 mL via INTRAVENOUS

## 2016-06-29 MED ORDER — PERFLUTREN LIPID MICROSPHERE
INTRAVENOUS | Status: AC
  Filled 2016-06-29: qty 10

## 2016-06-29 NOTE — Progress Notes (Signed)
  Echocardiogram 2D Echocardiogram with Definity has been performed.  Eddie [name] 06/29/2016, 11:25 AM

## 2016-06-30 ENCOUNTER — Encounter: Payer: Self-pay | Admitting: Physical Medicine & Rehabilitation

## 2016-06-30 ENCOUNTER — Ambulatory Visit (HOSPITAL_BASED_OUTPATIENT_CLINIC_OR_DEPARTMENT_OTHER): Payer: Medicare HMO | Admitting: Physical Medicine & Rehabilitation

## 2016-06-30 ENCOUNTER — Encounter: Payer: Medicare HMO | Attending: Physical Medicine & Rehabilitation

## 2016-06-30 VITALS — BP 128/76 | HR 77 | Resp 14

## 2016-06-30 DIAGNOSIS — R1313 Dysphagia, pharyngeal phase: Secondary | ICD-10-CM | POA: Insufficient documentation

## 2016-06-30 DIAGNOSIS — I2510 Atherosclerotic heart disease of native coronary artery without angina pectoris: Secondary | ICD-10-CM | POA: Insufficient documentation

## 2016-06-30 DIAGNOSIS — K279 Peptic ulcer, site unspecified, unspecified as acute or chronic, without hemorrhage or perforation: Secondary | ICD-10-CM | POA: Insufficient documentation

## 2016-06-30 DIAGNOSIS — I639 Cerebral infarction, unspecified: Secondary | ICD-10-CM

## 2016-06-30 DIAGNOSIS — R269 Unspecified abnormalities of gait and mobility: Secondary | ICD-10-CM | POA: Diagnosis not present

## 2016-06-30 DIAGNOSIS — R498 Other voice and resonance disorders: Secondary | ICD-10-CM | POA: Insufficient documentation

## 2016-06-30 DIAGNOSIS — I69398 Other sequelae of cerebral infarction: Secondary | ICD-10-CM | POA: Diagnosis not present

## 2016-06-30 DIAGNOSIS — E1142 Type 2 diabetes mellitus with diabetic polyneuropathy: Secondary | ICD-10-CM | POA: Diagnosis not present

## 2016-06-30 DIAGNOSIS — R41841 Cognitive communication deficit: Secondary | ICD-10-CM | POA: Diagnosis present

## 2016-06-30 NOTE — Patient Instructions (Addendum)
Continue to walk  May use compression hose  You should expect a call from the foot care office, [facility]

## 2016-06-30 NOTE — Progress Notes (Signed)
Subjective:    Patient ID: Eddie [name], male    DOB: [birth year], 65 y.o.   MRN: [id number]  HPI Patient returns today, reviewed chart, multiple cardiology visits.  History of congestive heart failure, ischemic cardiomyopathy. Functionally, he remains independent, his wife needs to drive him, however. Uses a straight cane. No falls. Gets a sensation of freezing in his feet, but when he touches his toes. It feels normal temperature.   Pain Inventory Average Pain 0 Pain Right Now 0 My pain is no pain  In the last 24 hours, has pain interfered with the following? General activity 0 Relation with others 0 Enjoyment of life 0 What TIME of day is your pain at its worst? no pain Sleep (in general) Good  Pain is worse with: no pain Pain improves with: no pain Relief from Meds: no pain  Mobility walk with assistance use a cane how many minutes can you walk? 15 ability to climb steps?  yes do you drive?  no Do you have any goals in this area?  yes  Function Do you have any goals in this area?  yes  Neuro/Psych numbness trouble walking  Prior Studies Any changes since last visit?  no  Physicians involved in your care Any changes since last visit?  no   History reviewed. No pertinent family history. Social History   Social History  . Marital status: Married    Spouse name: N/A  . Number of children: N/A  . Years of education: N/A   Social History Main Topics  . Smoking status: Light Tobacco Smoker    Packs/day: 0.25  . Smokeless tobacco: Current User  . Alcohol use No  . Drug use: No  . Sexual activity: Not Asked   Other Topics Concern  . None   Social History Narrative  . None   Past Surgical History:  Procedure Laterality Date  . CARDIAC CATHETERIZATION N/A 09/28/2015   Procedure: Left Heart Cath and Coronary Angiography;  Surgeon: Runell Gess, MD;  Location: [facility] INVASIVE CV LAB;  Service: Cardiovascular;  Laterality: N/A;  . CARDIAC  CATHETERIZATION N/A 10/08/2015   Procedure: Left Heart Cath and Coronary Angiography;  Surgeon: Lyn Records, MD;  Location: [facility] INVASIVE CV LAB;  Service: Cardiovascular;  Laterality: N/A;  . Repair of Peptic Ulcer     Past Medical History:  Diagnosis Date  . CAD (coronary artery disease)    a. STEMI 09/2015 w/ DES to Prox LAD  . CVA (cerebral infarction)    a. 09/2015: acute small right frontal lobe infarct  . Diabetes (HCC)   . Hypertension   . Ischemic cardiomyopathy   . PUD (peptic ulcer disease)    BP 128/76 (BP Location: Right Arm, Patient Position: Sitting, Cuff Size: Large)   Pulse 77   Resp 14   SpO2 96%   Opioid Risk Score:   Fall Risk Score:  `1  Depression screen PHQ 2/9  Depression screen PHQ 2/9 11/09/2015  Decreased Interest 0  Down, Depressed, Hopeless 0  PHQ - 2 Score 0    Review of Systems  Constitutional: Negative.   HENT: Negative.   Eyes: Negative.   Respiratory: Positive for cough.   Cardiovascular: Negative.   Gastrointestinal: Negative.   Endocrine:       Diabetic  Genitourinary: Negative.   Musculoskeletal: Positive for gait problem.  Skin: Negative.   Allergic/Immunologic: Negative.   Neurological: Positive for numbness.  Hematological: Negative.   Psychiatric/Behavioral: Negative.   All  other systems reviewed and are negative.      Objective:   Physical Exam  Constitutional: He is oriented to person, place, and time. He appears well-developed and well-nourished.  HENT:  Head: Normocephalic and atraumatic.  Eyes: Conjunctivae and EOM are normal. Pupils are equal, round, and reactive to light.  Neck: Normal range of motion.  Neurological: He is alert and oriented to person, place, and time.  Psychiatric: He has a normal mood and affect.  Nursing note and vitals reviewed.  decreased right median distribution. Sensation to pinprick. Decreased left ulnar sensation pinprick Lower extremity sensation pinprick intact. Lower extremity.  Light touch intact Onychogryphosis , bilateral great toes Motor strength is 5/5 bilateral deltoid, bicep, triceps, grip, hip flexor, knee extensor, ankle dorsiflexor Lower extremity. Pulses are normal in the dorsalis pedis and posterior tibial    Assessment & Plan:  1. History of small right frontal lobe infarct, peri-catheterization, has had good recovery from this. No further formalized rehabilitation needed. Continue to walk with a cane  2. 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 Discussed with patient importance of strict diabetic control, patient has some altered sensation but not any significant pain that causes him awakening at night. If this becomes more painful, would recommend trial of gabapentin  Over half of the 25 min visit was spent counseling and coordinating care.Main focus was on diabetic polyneuropathy  In terms of lower extremity foot care. Recommend podiatry. Made referral

## 2016-07-10 MED FILL — ATORVASTATIN 80 MG TABLET: 80 | 30 days supply | Qty: 30 | Fill #4

## 2016-07-13 MED FILL — CARVEDILOL 12.5 MG TABLET: 12.5 | 30 days supply | Qty: 60 | Fill #4

## 2016-07-14 MED FILL — ENTRESTO 49 MG-51 MG TABLET: 49-51 | 30 days supply | Qty: 60 | Fill #0

## 2016-07-25 MED FILL — SPIRONOLACTONE 25 MG TABLET: 25 | 30 days supply | Qty: 15 | Fill #2

## 2016-07-25 MED FILL — FUROSEMIDE 20 MG TABLET: 20 | 30 days supply | Qty: 60 | Fill #2

## 2016-07-28 ENCOUNTER — Encounter (HOSPITAL_COMMUNITY): Payer: Self-pay | Admitting: Internal Medicine

## 2016-07-28 ENCOUNTER — Ambulatory Visit (HOSPITAL_COMMUNITY)
Admission: RE | Admit: 2016-07-28 | Discharge: 2016-07-28 | Disposition: A | Discharge status: Home / Self Care | Payer: Medicare HMO | Source: Ambulatory Visit | Attending: Internal Medicine | Admitting: Internal Medicine

## 2016-07-28 VITALS — BP 132/84 | HR 80 | Wt 162.8 lb

## 2016-07-28 DIAGNOSIS — R0683 Snoring: Secondary | ICD-10-CM | POA: Insufficient documentation

## 2016-07-28 DIAGNOSIS — I110 Hypertensive heart disease with heart failure: Secondary | ICD-10-CM | POA: Insufficient documentation

## 2016-07-28 DIAGNOSIS — Z794 Long term (current) use of insulin: Secondary | ICD-10-CM | POA: Diagnosis not present

## 2016-07-28 DIAGNOSIS — E119 Type 2 diabetes mellitus without complications: Secondary | ICD-10-CM | POA: Insufficient documentation

## 2016-07-28 DIAGNOSIS — I2510 Atherosclerotic heart disease of native coronary artery without angina pectoris: Secondary | ICD-10-CM

## 2016-07-28 DIAGNOSIS — Z720 Tobacco use: Secondary | ICD-10-CM | POA: Insufficient documentation

## 2016-07-28 DIAGNOSIS — I5022 Chronic systolic (congestive) heart failure: Secondary | ICD-10-CM | POA: Diagnosis not present

## 2016-07-28 MED ORDER — ISOSORB DINITRATE-HYDRALAZINE 20-37.5 MG PO TABS: 1.0000 | ORAL_TABLET | Freq: Three times a day (TID) | ORAL | 3 refills | Status: DC

## 2016-07-28 MED FILL — CLOPIDOGREL 75 MG TABLET: 75 | 30 days supply | Qty: 30 | Fill #2

## 2016-07-28 MED FILL — BIDIL TABLET: 20-37.5 | 30 days supply | Qty: 90 | Fill #0

## 2016-07-28 NOTE — Addendum Note (Signed)
Encounter addended by: Suezanne Cheshire, RN on: 07/28/2016  3:24 PM<BR>    Actions taken: Order list changed, Diagnosis association updated, Sign clinical note

## 2016-07-28 NOTE — Patient Instructions (Signed)
START taking Bidil (1Tab) Three times daily  You have been referred to have a sleep study  Follow up in 3 Months

## 2016-07-28 NOTE — Progress Notes (Signed)
ADVANCED HF CLINIC NOTE    HPI:  Eddie [name] is a 66 y/o male with h/o DM2, CAD s/p NSTEMI with LAD stent in 3/17, CVA, chronic systolic HF with EF 41-28%.   Had multiple admissions in May and June 2017 in the setting of decompensated CHF and EColi bacteremia. Readmitted in June 2017 with acute respiratory failure requiring intubation in setting of severe HTN and ADHF. Treated with milrinone which was eventually weaned off.    Return for post-hospital f/u. Feels good. Going to neuro rehab. Had a fall last week when he got up out of bed,   We saw him last month and he was out of many of his medicines. We tried to simplify his regimen and put him on carvedilol 6.25 mg BID, Entresto 49-51 mg BID and spironolactone 12.5 mg daily and lasix '40mg'$  daily. Seen in PharmD clinic on 8/16 felt to be ovediuresed. Want to decrease lasix but could not get a hold of him by phone for that or Paramedicine  Here for f/u. At last visit Bidil added but said the Pharmacy didn't have it for him. Lifevest stopped as he did not tolerate it. Overall feeling pretty good. However says when he wakes up in the morning doesn't have much energy at all. Denies dyspnea. About midday he feels better and can do what he wants. Family says he snores a lot at night. Weight at home stable. Ambulates with cane for balance. Walks 2 -3 blocks every day. No CP.   Taking all medications. Medicaid approved.  Smokes 1 cigarette every other day.   ECHO 06/19/2016: EF 20-25%.Cannot exclude small  laminar apical thrombus. No mobile or protuberant thrombus is  seen. Echo 5/15 EF 15-20%   Past Medical History:  Diagnosis Date  . CAD (coronary artery disease)    a. STEMI 09/2015 w/ DES to Prox LAD  . CVA (cerebral infarction)    a. 09/2015: acute small right frontal lobe infarct  . Diabetes ([place])   . Hypertension   . Ischemic cardiomyopathy   . PUD (peptic ulcer disease)     Current Outpatient Prescriptions  Medication Sig Dispense  Refill  . aspirin EC 81 MG tablet Take 1 tablet (81 mg total) by mouth daily. 90 tablet 3  . atorvastatin (LIPITOR) 80 MG tablet Take 1 tablet (80 mg total) by mouth daily at 6 PM. 30 tablet 5  . blood glucose meter kit and supplies KIT Dispense based on patient and insurance preference. Use up to four times daily as directed. (FOR ICD-9 250.00, 250.01). 1 each 0  . carvedilol (COREG) 12.5 MG tablet Take 1 tablet (12.5 mg total) by mouth 2 (two) times daily. 60 tablet 5  . clopidogrel (PLAVIX) 75 MG tablet TAKE 1 TABLET (75 MG TOTAL) BY MOUTH DAILY. 30 tablet 6  . furosemide (LASIX) 20 MG tablet TAKE 2 TABLETS (40 MG) BY MOUTH ONCE DAILY 60 tablet 6  . Insulin Glargine (LANTUS) 100 UNIT/ML Solostar Pen Inject 10 Units into the skin daily at 10 pm. 15 mL 0  . Insulin Pen Needle 31G X 5 MM MISC 1 each by Does not apply route 3 (three) times daily before meals. 90 each 0  . ipratropium-albuterol (DUONEB) 0.5-2.5 (3) MG/3ML SOLN Take 3 mLs by nebulization every 6 (six) hours as needed (shortness of breath).    . nitroGLYCERIN (NITROSTAT) 0.4 MG SL tablet Use one pill every 5 minutes X 3 if needed for chest pain. Contact MD if pain  does not improve 30 tablet 0  . sacubitril-valsartan (ENTRESTO) 49-51 MG Take 1 tablet by mouth 2 (two) times daily. 60 tablet 5  . spironolactone (ALDACTONE) 25 MG tablet Take 0.5 tablets (12.5 mg total) by mouth daily. 15 tablet 2   No current facility-administered medications for this encounter.     No Known Allergies    Social History   Social History  . Marital status: Married    Spouse name: N/A  . Number of children: N/A  . Years of education: N/A   Occupational History  . Not on file.   Social History Main Topics  . Smoking status: Light Tobacco Smoker    Packs/day: 0.25  . Smokeless tobacco: Current User  . Alcohol use No  . Drug use: No  . Sexual activity: Not on file   Other Topics Concern  . Not on file   Social History Narrative  . No  narrative on file     No family history on file.  Vitals:   07/28/16 1441  BP: 132/84  Pulse: 80  SpO2: 100%  Weight: 162 lb 12.8 oz (73.8 kg)    PHYSICAL EXAM: General:  Thin Well appearing. No respiratory difficulty HEENT: normal Neck: supple. JVP 5-6 Carotids 2+ bilat; no bruits. No lymphadenopathy or thryomegaly appreciated. Cor: PMI laterally displaced. Regular rate & rhythm. No rubs, gallops. Soft SEM at apex Lungs: clear but decreased throughout Abdomen: soft, nontender, nondistended. No hepatosplenomegaly. No bruits or masses. Good bowel sounds. Extremities: no cyanosis, clubbing, rash, no  edema Neuro: alert & oriented x 3, cranial nerves grossly intact. moves all 4 extremities w/o difficulty. Affect pleasant.  ASSESSMENT & PLAN: 1. Chronicsystolic CHF (May 6283 EF 15-20% and 12/17 EF 25-30%  due to ICM.   - NYHA class IIsymptoms. Volume status looks great.  -  Continue carvedilol to 12.5 mg BID and Entresto 49-51 mg BID,spironolactone 12.5 mg daily - Start bidil 1 tab tid.  - Continue Lasix 20 mg bid  - Reinforced need for daily weights and reviewed use of sliding scale diuretics. - Stopped Lifevest.  If EF remains down after complying with GDMT consider ICD. Repeat echo in 3-4 months.  2. CAD  - Continue ASA 81 mg daily, statin, b-blocker - Now off coumadin d/t non-complaince. Will continue clopidogrel 75 mg daily,  3. HTN --Improved control but still mildly elevated. Starting Bidil.  3. CVA, presumed cardi-embolic - Coumadin stopped due to non compliance. Treat with ASA, plavix and statin 4. DMII - Continue Lantus per PCP - On statin and ARNI 5. Morning fatigue with snoring - I am worried he may have sleep apnea. Will get sleep study to further investigate.  6. Tobacco use - Cutting back. Hopefully he can stop completely.   Eddie [name], Daniel,MD 2:57 PM

## 2016-07-31 ENCOUNTER — Telehealth: Payer: Self-pay | Admitting: Licensed Clinical Social Worker

## 2016-07-31 NOTE — Telephone Encounter (Signed)
CSW received referral to assist patient with a PCP. CSW left message for patient to return call to CSW. Lasandra Beech, LCSW [phone]

## 2016-08-02 ENCOUNTER — Ambulatory Visit (INDEPENDENT_AMBULATORY_CARE_PROVIDER_SITE_OTHER): Payer: Medicare HMO | Admitting: Podiatry

## 2016-08-02 ENCOUNTER — Encounter: Payer: Self-pay | Admitting: Podiatry

## 2016-08-02 DIAGNOSIS — L603 Nail dystrophy: Secondary | ICD-10-CM

## 2016-08-02 DIAGNOSIS — E0843 Diabetes mellitus due to underlying condition with diabetic autonomic (poly)neuropathy: Secondary | ICD-10-CM

## 2016-08-02 DIAGNOSIS — L608 Other nail disorders: Secondary | ICD-10-CM | POA: Diagnosis not present

## 2016-08-02 DIAGNOSIS — B351 Tinea unguium: Secondary | ICD-10-CM

## 2016-08-02 DIAGNOSIS — M79609 Pain in unspecified limb: Secondary | ICD-10-CM | POA: Diagnosis not present

## 2016-08-02 LAB — HM DIABETES FOOT EXAM

## 2016-08-02 NOTE — Progress Notes (Signed)
SUBJECTIVE Patient with a history of diabetes mellitus presents to office today complaining of elongated, thickened nails. Pain while ambulating in shoes. Patient is unable to trim their own nails.   No Known Allergies  OBJECTIVE General Patient is awake, alert, and oriented x 3 and in no acute distress. Derm Skin is dry and supple bilateral. Negative open lesions or macerations. Remaining integument unremarkable. Nails are tender, long, thickened and dystrophic with subungual debris, consistent with onychomycosis, 1-5 bilateral. No signs of infection noted. Vasc  DP and PT pedal pulses palpable bilaterally. Temperature gradient within normal limits.  Neuro Epicritic and protective threshold sensation diminished bilaterally.  Musculoskeletal Exam No symptomatic pedal deformities noted bilateral. Muscular strength within normal limits.  ASSESSMENT 1. Diabetes Mellitus w/ peripheral neuropathy 2. Onychomycosis of nail due to dermatophyte bilateral 3. Pain in foot bilateral  PLAN OF CARE 1. Patient evaluated today. 2. Instructed to maintain good pedal hygiene and foot care. Stressed importance of controlling blood sugar.  3. Mechanical debridement of nails 1-5 bilaterally performed using a nail nipper. Filed with dremel without incident.  4. Return to clinic in 3 mos.     Brent M. Evans, DPM [facility]  Dr. Brent M. Evans, DPM   [address]                                        [place], [place] [address]                Office [phone]  Fax [phone]    

## 2016-08-08 ENCOUNTER — Encounter (HOSPITAL_COMMUNITY): Payer: Self-pay

## 2016-08-08 NOTE — Progress Notes (Signed)
[facility] medical record request mailed dated 07/21/2016 completed and return faxed to provided # [phone] (68 pages total). Copy of request scanned into patient's electronic medical record. Case # [id number]  Ave Filter, RN

## 2016-08-24 MED FILL — FUROSEMIDE 20 MG TABLET: 20 | 30 days supply | Qty: 60 | Fill #3

## 2016-08-24 MED FILL — ENTRESTO 49 MG-51 MG TABLET: 49-51 | 30 days supply | Qty: 60 | Fill #1

## 2016-08-24 MED FILL — CLOPIDOGREL 75 MG TABLET: 75 | 30 days supply | Qty: 30 | Fill #3

## 2016-08-24 MED FILL — SPIRONOLACTONE 25 MG TABLET: 25 | 30 days supply | Qty: 15 | Fill #3

## 2016-08-24 MED FILL — ATORVASTATIN 80 MG TABLET: 80 | 30 days supply | Qty: 30 | Fill #5

## 2016-08-24 MED FILL — CARVEDILOL 12.5 MG TABLET: 12.5 | 30 days supply | Qty: 60 | Fill #5

## 2016-09-13 ENCOUNTER — Encounter (HOSPITAL_BASED_OUTPATIENT_CLINIC_OR_DEPARTMENT_OTHER): Payer: Self-pay

## 2016-09-22 ENCOUNTER — Telehealth (HOSPITAL_COMMUNITY): Payer: Self-pay | Admitting: Pharmacist

## 2016-09-22 ENCOUNTER — Other Ambulatory Visit (HOSPITAL_COMMUNITY): Payer: Self-pay | Admitting: Internal Medicine

## 2016-09-22 DIAGNOSIS — I2102 ST elevation (STEMI) myocardial infarction involving left anterior descending coronary artery: Secondary | ICD-10-CM

## 2016-09-22 NOTE — Telephone Encounter (Addendum)
Entresto 49-51 mg BID PA approved by [employer] Part D through 09/23/18.  Tyler Deis. Bonnye Fava, PharmD, BCPS, CPP Clinical Pharmacist Pager: [phone] Phone: [phone] 09/22/2016 2:39 PM

## 2016-10-02 MED FILL — ENTRESTO 49 MG-51 MG TABLET: 49-51 | 30 days supply | Qty: 60 | Fill #2

## 2016-10-04 ENCOUNTER — Ambulatory Visit (HOSPITAL_BASED_OUTPATIENT_CLINIC_OR_DEPARTMENT_OTHER): Payer: Medicare HMO | Attending: Internal Medicine | Admitting: Cardiology

## 2016-10-04 VITALS — Ht 66.0 in | Wt 160.0 lb

## 2016-10-04 DIAGNOSIS — R0683 Snoring: Secondary | ICD-10-CM

## 2016-10-04 DIAGNOSIS — G4733 Obstructive sleep apnea (adult) (pediatric): Secondary | ICD-10-CM

## 2016-10-05 ENCOUNTER — Telehealth: Payer: Self-pay | Admitting: Internal Medicine

## 2016-10-05 ENCOUNTER — Telehealth (HOSPITAL_COMMUNITY): Payer: Self-pay | Admitting: Pharmacist

## 2016-10-05 ENCOUNTER — Other Ambulatory Visit (HOSPITAL_COMMUNITY): Payer: Self-pay | Admitting: *Deleted

## 2016-10-05 ENCOUNTER — Telehealth: Payer: Self-pay | Admitting: Licensed Clinical Social Worker

## 2016-10-05 MED FILL — FUROSEMIDE 20 MG TABLET: 20 | 30 days supply | Qty: 60 | Fill #4

## 2016-10-05 MED FILL — ATORVASTATIN 80 MG TABLET: 80 | 30 days supply | Qty: 30 | Fill #0

## 2016-10-05 MED FILL — SPIRONOLACTONE 25 MG TABLET: 25 | 30 days supply | Qty: 15 | Fill #4

## 2016-10-05 MED FILL — CLOPIDOGREL 75 MG TABLET: 75 | 30 days supply | Qty: 30 | Fill #4

## 2016-10-05 MED FILL — CARVEDILOL 12.5 MG TABLET: 12.5 | 30 days supply | Qty: 60 | Fill #0

## 2016-10-05 NOTE — Telephone Encounter (Signed)
APT. REMINDER CALL, NO ANSWER, NO VOICEMAIL °
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
## 2016-10-06 ENCOUNTER — Ambulatory Visit (INDEPENDENT_AMBULATORY_CARE_PROVIDER_SITE_OTHER): Payer: Medicare HMO | Admitting: Internal Medicine

## 2016-10-06 VITALS — BP 148/78 | HR 83 | Temp 98.2°F | Ht 66.0 in | Wt 161.9 lb

## 2016-10-06 DIAGNOSIS — F17290 Nicotine dependence, other tobacco product, uncomplicated: Secondary | ICD-10-CM

## 2016-10-06 DIAGNOSIS — Z955 Presence of coronary angioplasty implant and graft: Secondary | ICD-10-CM | POA: Diagnosis not present

## 2016-10-06 DIAGNOSIS — Z794 Long term (current) use of insulin: Secondary | ICD-10-CM | POA: Diagnosis not present

## 2016-10-06 DIAGNOSIS — N183 Chronic kidney disease, stage 3 unspecified: Secondary | ICD-10-CM | POA: Insufficient documentation

## 2016-10-06 DIAGNOSIS — I2510 Atherosclerotic heart disease of native coronary artery without angina pectoris: Secondary | ICD-10-CM | POA: Diagnosis not present

## 2016-10-06 DIAGNOSIS — Z7902 Long term (current) use of antithrombotics/antiplatelets: Secondary | ICD-10-CM

## 2016-10-06 DIAGNOSIS — I69398 Other sequelae of cerebral infarction: Secondary | ICD-10-CM | POA: Diagnosis not present

## 2016-10-06 DIAGNOSIS — Z1159 Encounter for screening for other viral diseases: Secondary | ICD-10-CM | POA: Diagnosis not present

## 2016-10-06 DIAGNOSIS — R208 Other disturbances of skin sensation: Secondary | ICD-10-CM | POA: Diagnosis not present

## 2016-10-06 DIAGNOSIS — Z0000 Encounter for general adult medical examination without abnormal findings: Secondary | ICD-10-CM

## 2016-10-06 DIAGNOSIS — Z23 Encounter for immunization: Secondary | ICD-10-CM

## 2016-10-06 DIAGNOSIS — I5042 Chronic combined systolic (congestive) and diastolic (congestive) heart failure: Secondary | ICD-10-CM | POA: Diagnosis not present

## 2016-10-06 DIAGNOSIS — Z79899 Other long term (current) drug therapy: Secondary | ICD-10-CM

## 2016-10-06 DIAGNOSIS — E118 Type 2 diabetes mellitus with unspecified complications: Secondary | ICD-10-CM

## 2016-10-06 DIAGNOSIS — Z7982 Long term (current) use of aspirin: Secondary | ICD-10-CM | POA: Diagnosis not present

## 2016-10-06 DIAGNOSIS — E1165 Type 2 diabetes mellitus with hyperglycemia: Secondary | ICD-10-CM

## 2016-10-06 LAB — POCT GLYCOSYLATED HEMOGLOBIN (HGB A1C): HEMOGLOBIN A1C: 12.8

## 2016-10-06 LAB — GLUCOSE, CAPILLARY: Glucose-Capillary: 254 mg/dL — ABNORMAL HIGH (ref 65–99)

## 2016-10-06 MED ORDER — INSULIN GLARGINE 100 UNIT/ML SOLOSTAR PEN: 10.0000 [IU] | PEN_INJECTOR | Freq: Every day | SUBCUTANEOUS | 0 refills | Status: DC

## 2016-10-06 MED ORDER — METFORMIN HCL 1000 MG PO TABS: 1000.0000 mg | ORAL_TABLET | Freq: Two times a day (BID) | ORAL | 3 refills | Status: DC

## 2016-10-06 MED ORDER — INSULIN PEN NEEDLE 31G X 5 MM MISC: 1.0000 | Freq: Three times a day (TID) | 0 refills | Status: DC

## 2016-10-06 MED ORDER — BLOOD GLUCOSE MONITOR KIT: PACK | 0 refills | Status: DC

## 2016-10-06 MED FILL — metFORMIN HCL 1000 MG TABS: 1000 | 60 days supply | Qty: 120 | Fill #0

## 2016-10-06 MED FILL — LANTUS SOLOSTAR 100 UNITS/M: 100 | 30 days supply | Qty: 3 | Fill #0

## 2016-10-06 MED FILL — UNIFINE PENTIPS 31GX3/16": 31G X 5 MM | 33 days supply | Qty: 100 | Fill #0

## 2016-10-06 MED FILL — UNIFINE PENTIPS 31GX3/16: 31G X 5 MM | 33 days supply | Qty: 100 | Fill #0

## 2016-10-06 MED FILL — TRUE METRIX GLUCOSE TEST ST: 25 days supply | Qty: 100 | Fill #0

## 2016-10-06 MED FILL — TRUEplus LANCETS 30G MISC: 25 days supply | Qty: 100 | Fill #0

## 2016-10-06 NOTE — Assessment & Plan Note (Signed)
Last hgba1c 9.6. Has lost meter so does not know recent glucose readings, no hypoglycemias. On lantus 10 units daily.  -recheck hgba1c today. - continue lantus 10. Add metformin 2000mg  daily - refer to opthamology - no need for urine microalbumin as already on Entresto - gave flu shot  - check Hep C

## 2016-10-06 NOTE — Assessment & Plan Note (Signed)
Doing well, no SOB. EF low 30%, does not have ICD. Has hx of noncompliance with meds so may benefit from rechecking ECHO once he is compliant with meds. Has f/up appt with CHF clinic soon. - cont current meds: on spironolactone 12.5mg  daily + entersto bid, lasix 40mg  daily, coreg 12.5mg  bid.

## 2016-10-06 NOTE — Assessment & Plan Note (Addendum)
s/p DES 2017. On lipitor, coreg, asa, plavix. No chest pain. Continue current meds.

## 2016-10-06 NOTE — Assessment & Plan Note (Signed)
Checked Hep C screening ab. Gave flu shot. Discuss colonoscopy and pneumonia vaccine in the future.

## 2016-10-06 NOTE — Assessment & Plan Note (Signed)
Checked screening Hep c Ab.

## 2016-10-06 NOTE — Patient Instructions (Signed)
Start taking metformin 1000mg  twice a day Continue your lantus Will check your labs today and call you.  Will refer to eye doctor.  Follow up in 3 months.
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
## 2016-10-07 LAB — HEPATITIS C ANTIBODY

## 2016-10-08 NOTE — Procedures (Signed)
   Patient Name: Eddie [name], Eddie [name] Date: 10/04/2016 Gender: Male D.O.B: [birth year] Age (years): 52 Referring Provider: Bevelyn Buckles Bensimhon Height (inches): 65 Interpreting Physician: Armanda Magic MD, ABSM Weight (lbs): 160 RPSGT: Ulyess Mort BMI: 27 MRN: [id number] Neck Size: 15.00  CLINICAL INFORMATION Sleep Study Type: NPSG Indication for sleep study: Diabetes, Hypertension, Morning Headaches, Snoring Epworth Sleepiness Score: 6  SLEEP STUDY TECHNIQUE As per the AASM Manual for the Scoring of Sleep and Associated Events v2.3 (April 2016) with a hypopnea requiring 4% desaturations.  The channels recorded and monitored were frontal, central and occipital EEG, electrooculogram (EOG), submentalis EMG (chin), nasal and oral airflow, thoracic and abdominal wall motion, anterior tibialis EMG, snore microphone, electrocardiogram, and pulse oximetry.  MEDICATIONS Medications self-administered by patient taken the night of the study : N/A  SLEEP ARCHITECTURE The study was initiated at 10:38:25 PM and ended at 4:40:21 AM.  Sleep onset time was 34.0 minutes and the sleep efficiency was 64.8%. The total sleep time was 234.5 minutes.  Stage REM latency was 40.0 minutes.  The patient spent 41.79% of the night in stage N1 sleep, 42.43% in stage N2 sleep, 0.00% in stage N3 and 15.78% in REM.  Alpha intrusion was absent.  Supine sleep was 100.00%.  RESPIRATORY PARAMETERS The overall apnea/hypopnea index (AHI) was 8.2 per hour. There were 18 total apneas, including 17 obstructive, 0 central and 1 mixed apneas. There were 14 hypopneas and 32 RERAs.  The AHI during Stage REM sleep was 16.2 per hour.  AHI while supine was 8.2 per hour.  The mean oxygen saturation was 97.00%. The minimum SpO2 during sleep was 76.00%.  Loud snoring was noted during this study.  CARDIAC DATA The 2 lead EKG demonstrated sinus rhythm. The mean heart rate was 84.12 beats per minute. Other EKG  findings include: PVCs.  LEG MOVEMENT DATA The total PLMS were 0 with a resulting PLMS index of 0.00. Associated arousal with leg movement index was 0.0 .  IMPRESSIONS - Mild obstructive sleep apnea occurred during this study (AHI = 8.2/h). - No significant central sleep apnea occurred during this study (CAI = 0.0/h). - Moderate oxygen desaturation was noted during this study (Min O2 = 76.00%). - The patient snored with Loud snoring volume. - EKG findings include PVCs. - Clinically significant periodic limb movements did not occur during sleep. No significant associated arousals.  DIAGNOSIS - Obstructive Sleep Apnea (327.23 [G47.33 ICD-10]) - Nocturnal Hypoxemia (327.26 [G47.36 ICD-10])  RECOMMENDATIONS - Therapeutic CPAP titration to determine optimal pressure required to alleviate sleep disordered breathing. - Avoid alcohol, sedatives and other CNS depressants that may worsen sleep apnea and disrupt normal sleep architecture. - Sleep hygiene should be reviewed to assess factors that may improve sleep quality. - Weight management and regular exercise should be initiated or continued if appropriate.   Armanda Magic Diplomate, American Board of Sleep Medicine  ELECTRONICALLY SIGNED ON:  10/08/2016, 8:46 PM [facility] PH: [phone]   FX: [phone] ACCREDITED BY THE AMERICAN ACADEMY OF SLEEP MEDICINE

## 2016-10-09 ENCOUNTER — Telehealth: Payer: Self-pay | Admitting: *Deleted

## 2016-10-09 DIAGNOSIS — G4733 Obstructive sleep apnea (adult) (pediatric): Secondary | ICD-10-CM

## 2016-10-09 NOTE — Telephone Encounter (Signed)
Pt called / informed A1C is high per Dr. Tasia Catchings. He has not picked diabetes supplies; stated will pick them up this afternoon. Also needs  2 week f/u appt - appt scheduled 3/29 in [facility] per front office.

## 2016-10-09 NOTE — Telephone Encounter (Signed)
-----   Message from Hyacinth Meeker, MD sent at 10/09/2016  9:00 AM EDT ----- His hgba1c resulted very high. I tried to call him (went to [facility]) see whether he was able to pick up his diabetes supplies and started checking his sugar. I will like to bring him back sooner than 3 months to assess his diabetes control. I will send message to front desk to give him 2 week f/up appt.

## 2016-10-09 NOTE — Telephone Encounter (Signed)
Called the patient with his sleep study results and recommendations, He verbalized  understanding and agreed

## 2016-10-09 NOTE — Telephone Encounter (Signed)
-----   Message from Quintella Reichert, MD sent at 10/08/2016  8:48 PM EDT ----- Please let patient know that they have sleep apnea and recommend CPAP titration. Please set up titration in the sleep lab.

## 2016-10-10 ENCOUNTER — Encounter: Payer: Self-pay | Admitting: *Deleted

## 2016-10-10 ENCOUNTER — Telehealth: Payer: Self-pay | Admitting: *Deleted

## 2016-10-10 DIAGNOSIS — G4733 Obstructive sleep apnea (adult) (pediatric): Secondary | ICD-10-CM

## 2016-10-10 NOTE — Progress Notes (Signed)
Internal Medicine Clinic Attending  Case discussed with Dr. Ahmed at the time of the visit.  We reviewed the resident's history and exam and pertinent patient test results.  I agree with the assessment, diagnosis, and plan of care documented in the resident's note. 

## 2016-10-10 NOTE — Telephone Encounter (Signed)
-----   Message from Traci R Turner, MD sent at 10/08/2016  8:48 PM EDT ----- Please let patient know that they have sleep apnea and recommend CPAP titration. Please set up titration in the sleep lab. 

## 2016-10-11 ENCOUNTER — Ambulatory Visit (HOSPITAL_COMMUNITY)
Admission: RE | Admit: 2016-10-11 | Discharge: 2016-10-11 | Disposition: A | Discharge status: Home / Self Care | Payer: Medicare HMO | Source: Ambulatory Visit | Attending: Internal Medicine | Admitting: Internal Medicine

## 2016-10-11 VITALS — BP 156/84 | HR 89 | Wt 161.4 lb

## 2016-10-11 DIAGNOSIS — Z9114 Patient's other noncompliance with medication regimen: Secondary | ICD-10-CM | POA: Diagnosis not present

## 2016-10-11 DIAGNOSIS — I2510 Atherosclerotic heart disease of native coronary artery without angina pectoris: Secondary | ICD-10-CM | POA: Diagnosis not present

## 2016-10-11 DIAGNOSIS — R42 Dizziness and giddiness: Secondary | ICD-10-CM | POA: Insufficient documentation

## 2016-10-11 DIAGNOSIS — Z794 Long term (current) use of insulin: Secondary | ICD-10-CM | POA: Diagnosis not present

## 2016-10-11 DIAGNOSIS — R0602 Shortness of breath: Secondary | ICD-10-CM | POA: Insufficient documentation

## 2016-10-11 DIAGNOSIS — I5042 Chronic combined systolic (congestive) and diastolic (congestive) heart failure: Secondary | ICD-10-CM

## 2016-10-11 DIAGNOSIS — Z720 Tobacco use: Secondary | ICD-10-CM | POA: Diagnosis not present

## 2016-10-11 DIAGNOSIS — Z8673 Personal history of transient ischemic attack (TIA), and cerebral infarction without residual deficits: Secondary | ICD-10-CM | POA: Insufficient documentation

## 2016-10-11 DIAGNOSIS — M7989 Other specified soft tissue disorders: Secondary | ICD-10-CM | POA: Insufficient documentation

## 2016-10-11 DIAGNOSIS — G4733 Obstructive sleep apnea (adult) (pediatric): Secondary | ICD-10-CM | POA: Insufficient documentation

## 2016-10-11 DIAGNOSIS — I252 Old myocardial infarction: Secondary | ICD-10-CM | POA: Diagnosis not present

## 2016-10-11 DIAGNOSIS — T465X5A Adverse effect of other antihypertensive drugs, initial encounter: Secondary | ICD-10-CM | POA: Diagnosis not present

## 2016-10-11 DIAGNOSIS — Z7902 Long term (current) use of antithrombotics/antiplatelets: Secondary | ICD-10-CM | POA: Diagnosis not present

## 2016-10-11 DIAGNOSIS — I110 Hypertensive heart disease with heart failure: Secondary | ICD-10-CM | POA: Diagnosis not present

## 2016-10-11 DIAGNOSIS — I5022 Chronic systolic (congestive) heart failure: Secondary | ICD-10-CM | POA: Insufficient documentation

## 2016-10-11 DIAGNOSIS — E118 Type 2 diabetes mellitus with unspecified complications: Secondary | ICD-10-CM | POA: Insufficient documentation

## 2016-10-11 LAB — BASIC METABOLIC PANEL
ANION GAP: 9 (ref 5–15)
BUN: 11 mg/dL (ref 6–20)
CO2: 28 mmol/L (ref 22–32)
Calcium: 9.2 mg/dL (ref 8.9–10.3)
Chloride: 102 mmol/L (ref 101–111)
Creatinine, Ser: 1.16 mg/dL (ref 0.61–1.24)
GFR calc Af Amer: >60 mL/min (ref >60)
GLUCOSE: 359 mg/dL — AB (ref 65–99)
POTASSIUM: 4.1 mmol/L (ref 3.5–5.1)
SODIUM: 139 mmol/L (ref 135–145)

## 2016-10-11 LAB — BRAIN NATRIURETIC PEPTIDE: B NATRIURETIC PEPTIDE 5: 119.9 pg/mL — AB (ref 0.0–100.0)

## 2016-10-11 MED ORDER — SPIRONOLACTONE 25 MG PO TABS: 25.0000 mg | ORAL_TABLET | Freq: Every day | ORAL | 5 refills | Status: DC

## 2016-10-11 NOTE — Patient Instructions (Addendum)
It was great to see you today!   Please increase your spironolactone dose to 1 tablet by mouth ONCE DAILY.   I will see you again in clinic on 10/23/2016 at 2:00 pm.   Labs today. We will call you with any abnormalities.
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
## 2016-10-18 ENCOUNTER — Telehealth: Payer: Self-pay | Admitting: Internal Medicine

## 2016-10-18 NOTE — Telephone Encounter (Signed)
APT. REMINDER CALL, NO ANSWER, NO VOICEMAIL ON EITHER NUMBER

## 2016-10-19 ENCOUNTER — Encounter: Payer: Self-pay | Admitting: Internal Medicine

## 2016-10-19 ENCOUNTER — Ambulatory Visit: Payer: Self-pay

## 2016-10-23 ENCOUNTER — Ambulatory Visit (HOSPITAL_COMMUNITY): Payer: Self-pay

## 2016-11-01 ENCOUNTER — Ambulatory Visit: Payer: Medicare HMO | Admitting: Podiatry

## 2016-11-01 NOTE — Progress Notes (Signed)
Patient ID: Eddie [name], male   DOB: [birth year], 66 y.o.   MRN: [id number]   No Show

## 2016-11-03 MED FILL — ENTRESTO 49 MG-51 MG TABLET: 49-51 | 30 days supply | Qty: 60 | Fill #3

## 2016-11-03 MED FILL — CLOPIDOGREL 75 MG TABLET: 75 | 30 days supply | Qty: 30 | Fill #5

## 2016-11-03 MED FILL — CARVEDILOL 12.5 MG TABLET: 12.5 | 30 days supply | Qty: 60 | Fill #1

## 2016-11-03 MED FILL — ATORVASTATIN 80 MG TABLET: 80 | 30 days supply | Qty: 30 | Fill #1

## 2016-11-03 MED FILL — FUROSEMIDE 20 MG TABLET: 20 | 30 days supply | Qty: 60 | Fill #5

## 2016-11-06 ENCOUNTER — Encounter: Payer: Self-pay | Admitting: *Deleted

## 2016-11-08 MED FILL — SPIRONOLACTONE 25 MG TABLET: 25 | 30 days supply | Qty: 30 | Fill #0

## 2016-11-09 ENCOUNTER — Encounter (HOSPITAL_COMMUNITY): Payer: Self-pay | Admitting: Internal Medicine

## 2016-11-09 ENCOUNTER — Ambulatory Visit (HOSPITAL_COMMUNITY)
Admission: RE | Admit: 2016-11-09 | Discharge: 2016-11-09 | Disposition: A | Discharge status: Home / Self Care | Payer: Medicare HMO | Source: Ambulatory Visit | Attending: Internal Medicine | Admitting: Internal Medicine

## 2016-11-09 VITALS — BP 128/80 | HR 86 | Wt 165.0 lb

## 2016-11-09 DIAGNOSIS — E1142 Type 2 diabetes mellitus with diabetic polyneuropathy: Secondary | ICD-10-CM | POA: Diagnosis not present

## 2016-11-09 DIAGNOSIS — Z7902 Long term (current) use of antithrombotics/antiplatelets: Secondary | ICD-10-CM | POA: Insufficient documentation

## 2016-11-09 DIAGNOSIS — E119 Type 2 diabetes mellitus without complications: Secondary | ICD-10-CM | POA: Insufficient documentation

## 2016-11-09 DIAGNOSIS — Z8711 Personal history of peptic ulcer disease: Secondary | ICD-10-CM | POA: Insufficient documentation

## 2016-11-09 DIAGNOSIS — I10 Essential (primary) hypertension: Secondary | ICD-10-CM

## 2016-11-09 DIAGNOSIS — I2510 Atherosclerotic heart disease of native coronary artery without angina pectoris: Secondary | ICD-10-CM | POA: Insufficient documentation

## 2016-11-09 DIAGNOSIS — Z7982 Long term (current) use of aspirin: Secondary | ICD-10-CM | POA: Diagnosis not present

## 2016-11-09 DIAGNOSIS — I5022 Chronic systolic (congestive) heart failure: Secondary | ICD-10-CM | POA: Diagnosis present

## 2016-11-09 DIAGNOSIS — F17210 Nicotine dependence, cigarettes, uncomplicated: Secondary | ICD-10-CM | POA: Diagnosis not present

## 2016-11-09 DIAGNOSIS — I5042 Chronic combined systolic (congestive) and diastolic (congestive) heart failure: Secondary | ICD-10-CM

## 2016-11-09 DIAGNOSIS — I255 Ischemic cardiomyopathy: Secondary | ICD-10-CM | POA: Insufficient documentation

## 2016-11-09 DIAGNOSIS — Z794 Long term (current) use of insulin: Secondary | ICD-10-CM | POA: Insufficient documentation

## 2016-11-09 DIAGNOSIS — N183 Chronic kidney disease, stage 3 unspecified: Secondary | ICD-10-CM

## 2016-11-09 DIAGNOSIS — I110 Hypertensive heart disease with heart failure: Secondary | ICD-10-CM | POA: Insufficient documentation

## 2016-11-09 DIAGNOSIS — I252 Old myocardial infarction: Secondary | ICD-10-CM | POA: Diagnosis present

## 2016-11-09 DIAGNOSIS — Z720 Tobacco use: Secondary | ICD-10-CM

## 2016-11-09 DIAGNOSIS — G4733 Obstructive sleep apnea (adult) (pediatric): Secondary | ICD-10-CM | POA: Insufficient documentation

## 2016-11-09 DIAGNOSIS — H5462 Unqualified visual loss, left eye, normal vision right eye: Secondary | ICD-10-CM | POA: Diagnosis not present

## 2016-11-09 MED ORDER — CARVEDILOL 12.5 MG PO TABS: 18.7500 mg | ORAL_TABLET | Freq: Two times a day (BID) | ORAL | 5 refills | Status: DC

## 2016-11-09 NOTE — Patient Instructions (Signed)
Increase Carvedilol to 18.75mg  (1 & 1/2 tablets) twice daily.    Follow up and Echo with Dr.Bensimon in 2 months

## 2016-11-09 NOTE — Progress Notes (Signed)
Advanced Heart Failure Clinic Note    HPI:  [name] is a 66 y/o male with h/o DM2, CAD s/p NSTEMI with LAD stent in 3/17, CVA, chronic systolic HF with EF 35-45%.   Had multiple admissions in May and June 2017 in the setting of decompensated CHF and EColi bacteremia. Readmitted in June 2017 with acute respiratory failure requiring intubation in setting of severe HTN and ADHF. Treated with milrinone which was eventually weaned off.     He presents today for regular follow up. Feeling OK overall. Seen by PharmD last visit and spiro increased. Weight up 4 lbs from that visit.  Does have mild orthopnea at times, but now waking up feeling refreshed.  Has been walking 2-3 blocks every day. Denies DOE on flat ground. Main complaint is leg fatigue. Still smoking 1-2 cigarettes every other day. Taking all medications. Has Medicaid and Medicare. Had + sleep study. Awaiting CPAP.   ECHO 06/19/2016: EF 20-25%.Cannot exclude small  laminar apical thrombus. No mobile or protuberant thrombus is  seen. Echo 5/15 EF 15-20%   Past Medical History:  Diagnosis Date  . Blindness of left eye   . CAD (coronary artery disease)    a. STEMI 09/2015 w/ DES to Prox LAD  . CVA (cerebral infarction)    a. 09/2015: acute small right frontal lobe infarct  . Diabetes ([place])   . Hypertension   . Ischemic cardiomyopathy   . PUD (peptic ulcer disease)     Current Outpatient Prescriptions  Medication Sig Dispense Refill  . aspirin EC 81 MG tablet Take 1 tablet (81 mg total) by mouth daily. 90 tablet 3  . atorvastatin (LIPITOR) 80 MG tablet TAKE 1 TABLET BY MOUTH ONCE DAILY AT 6 PM. 30 tablet 5  . blood glucose meter kit and supplies KIT Dispense based on patient and insurance preference. Use up to four times daily as directed. (FOR ICD-9 250.00, 250.01). 1 each 0  . carvedilol (COREG) 12.5 MG tablet TAKE 1 TABLET (12.5 MG TOTAL) BY MOUTH 2 (TWO) TIMES DAILY. 60 tablet 5  . clopidogrel (PLAVIX) 75 MG tablet TAKE  1 TABLET (75 MG TOTAL) BY MOUTH DAILY. 30 tablet 6  . furosemide (LASIX) 20 MG tablet Take 20 mg by mouth 2 (two) times daily.    . Insulin Glargine (LANTUS) 100 UNIT/ML Solostar Pen Inject 10 Units into the skin daily at 10 pm. 15 mL 0  . Insulin Pen Needle 31G X 5 MM MISC 1 each by Does not apply route 3 (three) times daily before meals. 90 each 0  . ipratropium-albuterol (DUONEB) 0.5-2.5 (3) MG/3ML SOLN Take 3 mLs by nebulization every 6 (six) hours as needed (shortness of breath).    . metFORMIN (GLUCOPHAGE) 1000 MG tablet Take 1 tablet (1,000 mg total) by mouth 2 (two) times daily with a meal. 120 tablet 3  . sacubitril-valsartan (ENTRESTO) 49-51 MG Take 1 tablet by mouth 2 (two) times daily. 60 tablet 5  . spironolactone (ALDACTONE) 25 MG tablet Take 1 tablet (25 mg total) by mouth daily. 30 tablet 5  . nitroGLYCERIN (NITROSTAT) 0.4 MG SL tablet Use one pill every 5 minutes X 3 if needed for chest pain. Contact MD if pain does not improve (Patient not taking: Reported on 11/09/2016) 30 tablet 0   No current facility-administered medications for this encounter.     Allergies  Allergen Reactions  . Bidil [Isosorb Dinitrate-Hydralazine] Other (See Comments)    Patient became lightheaded with 1 tablet  tid.       Social History   Social History  . Marital status: Married    Spouse name: N/A  . Number of children: N/A  . 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 Smoking status: Current Some Day Smoker  . Smokeless tobacco: Current User     Comment: 1 pk lasts 2 months for 1 year, used to do 1 pack every 2 weeks since 66 years of age.  . Alcohol use No  . Drug use: No  . Sexual activity: Not on file   Other Topics Concern  . Not on file   Social History Narrative  . No narrative on file      Family History  Problem Relation Age of Onset  . Diabetes Mother   . Cancer Neg Hx     Vitals:   11/09/16  1101  BP: 128/80  Pulse: 86  SpO2: 99%  Weight: 165 lb (74.8 kg)   Wt Readings from Last 3 Encounters:  11/09/16 165 lb (74.8 kg)  10/11/16 161 lb 6.4 oz (73.2 kg)  10/06/16 161 lb 14.4 oz (73.4 kg)     PHYSICAL EXAM: General:  Thin, Well appearing AA male in NAD.  HEENT: Normal except for poor dentition. Neck: supple. JVD 5-6. Carotids 2+ bilat; no bruits. No thyromegaly or nodule noted. Cor: PMI nondisplaced. RRR, No M/G/R noted Lungs: CTAB, normal effort. Abdomen: soft, non-tender, distended, no HSM. No bruits or masses. +BS  Extremities: no cyanosis, clubbing, rash, R and LLE no edema.  Neuro: alert & orientedx3, cranial nerves grossly intact. moves all 4 extremities w/o difficulty. Affect pleasant   ASSESSMENT & PLAN: 1. Chronicsystolic CHF (May 7564 EF 15-20% and 12/17 EF 25-30%  due to ICM.   - NYHA class II-III symptoms. Volume status looks stable on exam despite weight gain.  - Increase coreg to 18.75 mg BID.  - Continue Entresto 49-51 mg BID - Continue spirolactone 25 mg daily.  -  Intolerant to Bidil with marked dizziness and nausea. Will not re-challenge.   - Continue Lasix 20 mg bid. Can take extra as needed.  - Reinforced fluid restriction to < 2 L daily, sodium restriction to less than 2000 mg daily, and the importance of daily weights.   - Previously on Lifevest but has stopped.  If EF remains down after complying with GDMT consider ICD. Repeat echo in 2 months.  2. CAD  - Continue ASA 81 mg daily, statin, b-blocke - Continue plavix  75 mg daily,  3. HTN - Stable. Meds as above.  4. CVA, presumed cardi-embolic - Coumadin stopped due to non compliance. Treat with ASA, plavix and statin. No change.  4. DMII - Continue Lantus per PCP. Needs to call for refill.  - On statin and ARNI 5. + OSA - Sleep study 09/2016 positive. Has mask, just needs to get titrated. That appt is next month.  6. Tobacco use - Encouraged to stop completely.    Meds as above. Recent  labs stable.  RTC in 2 months with Echo for ICD consideration.   Shirley Friar, PA-C  11:19 AM   Patient seen and examined with the above-signed Advanced Practice Provider and/or Housestaff. I personally reviewed laboratory data, imaging studies and relevant notes. I independently examined the patient and formulated the important aspects of the plan. I have edited the note to reflect any  of my changes or salient points. I have personally discussed the plan with the patient and/or family.  He is improving slowly. NYHA II-III. Volume status looks ok. Agree with med adjustments as above. Will get repeat echo at next visit and then will need to decide on ICD.   Eddie Bickers, MD  9:20 PM

## 2016-11-15 ENCOUNTER — Other Ambulatory Visit: Payer: Self-pay | Admitting: *Deleted

## 2016-11-15 ENCOUNTER — Telehealth: Payer: Self-pay | Admitting: Internal Medicine

## 2016-11-15 DIAGNOSIS — Z794 Long term (current) use of insulin: Principal | ICD-10-CM

## 2016-11-15 DIAGNOSIS — E118 Type 2 diabetes mellitus with unspecified complications: Secondary | ICD-10-CM

## 2016-11-15 NOTE — Telephone Encounter (Signed)
Called and spoke w/ pt's spouse, called pharmacy, Barnie Del will call him and try to help determine what went wrong, I am sending a new refill request for 90 day supply instead of 60 day supply

## 2016-11-15 NOTE — Telephone Encounter (Signed)
metFORMIN (GLUCOPHAGE) 1000 MG tablet [place] [facility]  Says its too soon to fill please call pt

## 2016-11-16 MED ORDER — METFORMIN HCL 1000 MG PO TABS: 1000.0000 mg | ORAL_TABLET | Freq: Two times a day (BID) | ORAL | 3 refills | Status: DC

## 2016-11-26 ENCOUNTER — Ambulatory Visit (HOSPITAL_BASED_OUTPATIENT_CLINIC_OR_DEPARTMENT_OTHER): Payer: Medicare HMO | Attending: Cardiology | Admitting: Cardiology

## 2016-11-26 VITALS — Ht 66.0 in | Wt 160.0 lb

## 2016-11-26 DIAGNOSIS — G4733 Obstructive sleep apnea (adult) (pediatric): Secondary | ICD-10-CM | POA: Diagnosis not present

## 2016-11-27 ENCOUNTER — Other Ambulatory Visit (HOSPITAL_COMMUNITY): Payer: Self-pay | Admitting: Cardiology

## 2016-11-27 MED FILL — LANTUS SOLOSTAR 100 UNITS/M: 100 | 30 days supply | Qty: 3 | Fill #1

## 2016-11-29 MED FILL — CARVEDILOL 12.5 MG TABLET: 12.5 | 30 days supply | Qty: 90 | Fill #0

## 2016-12-04 MED FILL — SPIRONOLACTONE 25 MG TABLET: 25 | 30 days supply | Qty: 30 | Fill #1

## 2016-12-04 MED FILL — FUROSEMIDE 20 MG TABLET: 20 | 30 days supply | Qty: 60 | Fill #6

## 2016-12-04 MED FILL — CLOPIDOGREL 75 MG TABLET: 75 | 30 days supply | Qty: 30 | Fill #6

## 2016-12-04 MED FILL — ENTRESTO 49 MG-51 MG TABLET: 49-51 | 30 days supply | Qty: 60 | Fill #4

## 2016-12-04 MED FILL — ATORVASTATIN 80 MG TABLET: 80 | 30 days supply | Qty: 30 | Fill #2

## 2016-12-06 ENCOUNTER — Telehealth: Payer: Self-pay

## 2016-12-06 NOTE — Telephone Encounter (Signed)
LVM for appt reminder.  

## 2016-12-07 ENCOUNTER — Other Ambulatory Visit: Payer: Self-pay

## 2016-12-07 ENCOUNTER — Encounter: Payer: Self-pay | Admitting: Internal Medicine

## 2016-12-07 ENCOUNTER — Ambulatory Visit (INDEPENDENT_AMBULATORY_CARE_PROVIDER_SITE_OTHER): Payer: Medicare HMO | Admitting: Internal Medicine

## 2016-12-07 VITALS — BP 110/62 | HR 78 | Temp 97.7°F | Ht 66.0 in | Wt 161.7 lb

## 2016-12-07 DIAGNOSIS — E1140 Type 2 diabetes mellitus with diabetic neuropathy, unspecified: Secondary | ICD-10-CM

## 2016-12-07 DIAGNOSIS — R0609 Other forms of dyspnea: Secondary | ICD-10-CM | POA: Diagnosis not present

## 2016-12-07 DIAGNOSIS — E118 Type 2 diabetes mellitus with unspecified complications: Secondary | ICD-10-CM | POA: Diagnosis not present

## 2016-12-07 DIAGNOSIS — IMO0002 Reserved for concepts with insufficient information to code with codable children: Secondary | ICD-10-CM

## 2016-12-07 DIAGNOSIS — Z79899 Other long term (current) drug therapy: Secondary | ICD-10-CM

## 2016-12-07 DIAGNOSIS — F17210 Nicotine dependence, cigarettes, uncomplicated: Secondary | ICD-10-CM | POA: Diagnosis not present

## 2016-12-07 DIAGNOSIS — I110 Hypertensive heart disease with heart failure: Secondary | ICD-10-CM | POA: Diagnosis not present

## 2016-12-07 DIAGNOSIS — Z720 Tobacco use: Secondary | ICD-10-CM

## 2016-12-07 DIAGNOSIS — I509 Heart failure, unspecified: Secondary | ICD-10-CM

## 2016-12-07 DIAGNOSIS — E1165 Type 2 diabetes mellitus with hyperglycemia: Secondary | ICD-10-CM

## 2016-12-07 DIAGNOSIS — Z794 Long term (current) use of insulin: Secondary | ICD-10-CM | POA: Diagnosis not present

## 2016-12-07 DIAGNOSIS — Z23 Encounter for immunization: Secondary | ICD-10-CM

## 2016-12-07 DIAGNOSIS — I10 Essential (primary) hypertension: Secondary | ICD-10-CM

## 2016-12-07 DIAGNOSIS — Z0000 Encounter for general adult medical examination without abnormal findings: Secondary | ICD-10-CM

## 2016-12-07 MED ORDER — ALBUTEROL SULFATE HFA 108 (90 BASE) MCG/ACT IN AERS: 2.0000 | INHALATION_SPRAY | Freq: Four times a day (QID) | RESPIRATORY_TRACT | 2 refills | Status: DC | PRN

## 2016-12-07 MED ORDER — IPRATROPIUM-ALBUTEROL 0.5-2.5 (3) MG/3ML IN SOLN: 3.0000 mL | Freq: Four times a day (QID) | RESPIRATORY_TRACT | 0 refills | Status: DC | PRN

## 2016-12-07 MED FILL — IPRAT-ALBUT 0.5-3(2.5) MG/3: 0.5-2.5 (3) | 7 days supply | Qty: 90 | Fill #0

## 2016-12-07 MED FILL — VENTOLIN HFA 90 MCG INHALER: 108 (90 BAS | 25 days supply | Qty: 18 | Fill #0

## 2016-12-07 NOTE — Patient Instructions (Signed)
I am sending you for diabetic education.  STOP smoking!

## 2016-12-08 DIAGNOSIS — R0609 Other forms of dyspnea: Principal | ICD-10-CM

## 2016-12-08 LAB — MICROALBUMIN / CREATININE URINE RATIO
CREATININE, UR: 65 mg/dL
Microalbumin, Urine: <3 ug/mL

## 2016-12-08 NOTE — Assessment & Plan Note (Signed)
History of present illness: He presents with his wife today. She helps manage his medications. He reports she is taking metformin 1000 TWICE daily as well as 10 units of Lantus. He has his medications with him including his Lantus pen. He didn't bring his glucometer however upon download there is only one reading on the device. It appears that he does not know how to use his meter and thus he has not been checking his blood sugars. He does report some polyuria however he also takes diuretics.  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  Plan: His diabetes is clearly uncontrolled and he will need more aggressive care for this. However he clearly needs more education on the nature of the disease. At this point I think the best thing we can do is refer him for diabetic education. I did have my diabetic educator come in and teaching his glucometer today. At the next visit we may also consider adding a GOP 1 agonist given his comorbidities. I did obtain a urine microalbumin today.

## 2016-12-08 NOTE — Assessment & Plan Note (Signed)
History of present illness: No complaints, he has his blood pressure medications with him understands the dosing and reports compliance.  Assessment essential hypertension  Plan Continue spironolactone 25 mg daily, continue Entresto 49-51 milligrams twice a day, continue Coreg 18.5 mg twice a day

## 2016-12-08 NOTE — Assessment & Plan Note (Signed)
History of present illness: He is requesting a refill of DuoNeb. In my review of his chart are not exactly clear why this is prescribed. He is a current and long-term smoker. He also has congestive heart failure. I do not see a clear diagnosis of COPD or asthma that has been made. He is currently undergoing sleep study/CPAP titration per Dr. Sampson Goon for possible sleep apnea.  Assessment: Dyspnea  Plan I will go ahead and refill his DuoNeb's for now as well as provide him with an albuterol inhaler. However I'm not clear that he is she has an indication for the nebulizers or albuterol. I have encouraged him to stop smoking but will also obtain PFTs to see if there is no indication.

## 2016-12-08 NOTE — Progress Notes (Signed)
[facility] Subjective:  HPI: [name] is a 66 y.o. male who presents for Follow-up of hypertension diabetes  Please see problem discharging below for the status of his chronic medical conditions  Review of Systems: Review of Systems  Eyes: Negative for blurred vision.  Respiratory: Negative for cough.   Cardiovascular: Negative for chest pain.  Gastrointestinal: Negative for abdominal pain and heartburn.  Genitourinary: Positive for frequency.  Neurological: Positive for tingling.  Endo/Heme/Allergies: Negative for polydipsia.    Objective:  Physical Exam: Vitals:   12/07/16 1031  BP: 110/62  Pulse: 78  Temp: 97.7 F (36.5 C)  TempSrc: Oral  SpO2: 100%  Weight: 161 lb 11.2 oz (73.3 kg)  Height: 5\' 6"  (1.676 m)   Physical Exam  Constitutional: He is well-developed, well-nourished, and in no distress.  Cardiovascular: Normal rate and regular rhythm.   No murmur heard. Pulmonary/Chest: Effort normal and breath sounds normal. He has no wheezes.  Abdominal: Soft. Bowel sounds are normal.  Musculoskeletal: He exhibits no edema.  Nursing note and vitals reviewed.  Assessment & Plan:  Uncontrolled type 2 diabetes mellitus with diabetic neuropathy, with long-term current use of insulin (HCC) History of present illness: He presents with his wife today. She helps manage his medications. He reports she is taking metformin 1000 TWICE daily as well as 10 units of Lantus. He has his medications with him including his Lantus pen. He didn't bring his glucometer however upon download there is only one reading on the device. It appears that he does not know how to use his meter and thus he has not been checking his blood sugars. He does report some polyuria however he also takes diuretics.  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  Plan: His diabetes is clearly uncontrolled and he will need more  aggressive care for this. However he clearly needs more education on the nature of the disease. At this point I think the best thing we can do is refer him for diabetic education. I did have my diabetic educator come in and teaching his glucometer today. At the next visit we may also consider adding a GOP 1 agonist given his comorbidities. I did obtain a urine microalbumin today.  Dyspnea on exertion History of present illness: He is requesting a refill of DuoNeb. In my review of his chart are not exactly clear why this is prescribed. He is a current and long-term smoker. He also has congestive heart failure. I do not see a clear diagnosis of COPD or asthma that has been made. He is currently undergoing sleep study/CPAP titration per Dr. Sampson Goon for possible sleep apnea.  Assessment: Dyspnea  Plan I will go ahead and refill his DuoNeb's for now as well as provide him with an albuterol inhaler. However I'm not clear that he is she has an indication for the nebulizers or albuterol. I have encouraged him to stop smoking but will also obtain PFTs to see if there is no indication.  Tobacco abuse He is a long-term smoker. He reports he currently he is down to smoking 1 or 2 cigarettes a day  Assessment tobacco abuse  Plan I have encouraged him to quit smoking completely. He reports he is ready to quit and he was just waiting for a physician to tell him to quit.  Essential hypertension History of present illness: No complaints, he has his blood pressure medications with him understands the dosing and reports compliance.  Assessment essential hypertension  Plan Continue spironolactone 25 mg daily, continue Entresto 49-51 milligrams twice a day, continue Coreg 18.5 mg twice a day   Medications Ordered Meds ordered this encounter  Medications  . albuterol (PROVENTIL HFA;VENTOLIN HFA) 108 (90 Base) MCG/ACT inhaler    Sig: Inhale 2 puffs into the lungs every 6 (six) hours as needed for wheezing  or shortness of breath.    Dispense:  1 Inhaler    Refill:  2  . ipratropium-albuterol (DUONEB) 0.5-2.5 (3) MG/3ML SOLN    Sig: Take 3 mLs by nebulization every 6 (six) hours as needed (shortness of breath).    Dispense:  90 mL    Refill:  0   Other Orders Orders Placed This Encounter  Procedures  . Pneumococcal conjugate vaccine 13-valent  . Microalbumin / Creatinine Urine Ratio  . Referral to Nutrition and Diabetes Services    Referral Priority:   Routine    Referral Type:   Consultation    Referral Reason:   Specialty Services Required    Referred to Provider:   Plyler, Cecil Cranker, RD    Number of Visits Requested:   1  . IFOBT POC (occult bld, rslt in office)    Standing Status:   Future    Standing Expiration Date:   12/07/2017  . [facility] DIABETES FOOT EXAM    This external order was created through the Results Console.  . Pulmonary function test    Standing Status:   Future    Standing Expiration Date:   12/07/2017    Order Specific Question:   Where should this test be performed?    Answer:   [facility]    Order Specific Question:   Full PFT: includes the following: basic spirometry, spirometry pre & post bronchodilator, diffusion capacity (DLCO), lung volumes    Answer:   Full PFT   Follow Up: Return in about 2 months (around 02/06/2017).

## 2016-12-08 NOTE — Assessment & Plan Note (Addendum)
He is a long-term smoker. He reports he currently he is down to smoking 1 or 2 cigarettes a day  Assessment tobacco abuse  Plan I have encouraged him to quit smoking completely. He reports he is ready to quit and he was just waiting for a physician to tell him to quit.

## 2016-12-10 NOTE — Procedures (Signed)
   Patient Name: Eddie [name], Eddie [name] Date: 11/26/2016 Gender: Male D.O.B: 11/08/1950 Age (years): 52 Referring Provider: Bevelyn Buckles Bensimhon Height (inches): 65 Interpreting Physician: Armanda Magic MD, ABSM Weight (lbs): 160 RPSGT: Cornelious, [place] BMI: 27 MRN: [id number] Neck Size: 15.00  CLINICAL INFORMATION The patient is referred for a CPAP titration to treat sleep apnea. Date of NPSG, Split Night or HST:10/04/2016  SLEEP STUDY TECHNIQUE  As per the AASM Manual for the Scoring of Sleep and Associated Events v2.3 (April 2016) with a hypopnea requiring 4% desaturations.  The channels recorded and monitored were frontal, central and occipital EEG, electrooculogram (EOG), submentalis EMG (chin), nasal and oral airflow, thoracic and abdominal wall motion, anterior tibialis EMG, snore microphone, electrocardiogram, and pulse oximetry. Continuous positive airway pressure (CPAP) was initiated at the beginning of the study and titrated to treat sleep-disordered breathing.  MEDICATIONS Medications self-administered by patient taken the night of the study : LANTUS  TECHNICIAN COMMENTS Comments added by technician: PT REPORTED TAKING 10UNITS OF LANTUS AT 2200. PATIENT TOLERATED CPAP WITHOUT DIFFICULTY. NO BATHROOM BREAKS TAKEN  Comments added by scorer: N/A  RESPIRATORY PARAMETERS Optimal PAP Pressure (cm): 11  AHI at Optimal Pressure (/hr):0 Overall Minimal O2 (%):88.00  Supine % at Optimal Pressure (%):N/A Minimal O2 at Optimal Pressure (%): 93.00      SLEEP ARCHITECTURE The study was initiated at 11:22:07 PM and ended at 5:27:11 AM.  Sleep onset time was 7.2 minutes and the sleep efficiency was 85.1%. The total sleep time was 310.5 minutes.  The patient spent 30.11% of the night in stage N1 sleep, 45.41% in stage N2 sleep, 0.00% in stage N3 and 24.48% in REM.Stage REM latency was 44.0 minutes  Wake after sleep onset was 47.4. Alpha intrusion was absent. Supine sleep was  100.00%.  CARDIAC DATA The 2 lead EKG demonstrated sinus rhythm. The mean heart rate was 76.02 beats per minute. Other EKG findings include: PVCs.  LEG MOVEMENT DATA The total Periodic Limb Movements of Sleep (PLMS) were 19. The PLMS index was 3.67. A PLMS index of <15 is considered normal in adults.  IMPRESSIONS - An optimal PAP pressure of 11cm H2O is recommended for this patient. - Central sleep apnea was not noted during this titration (CAI = 0.2/h). - Mild oxygen desaturations were observed during this titration (min O2 = 88.00%). - The patient snored with Moderate snoring volume during this titration study. - 2-lead EKG demonstrated: PVCs - Clinically significant periodic limb movements were not noted during this study. Arousals associated with PLMs were rare.  DIAGNOSIS - Obstructive Sleep Apnea (327.23 [G47.33 ICD-10])  RECOMMENDATIONS - Recommend CPAP at 11 cm H2O.  - Avoid alcohol, sedatives and other CNS depressants that may worsen sleep apnea and disrupt normal sleep architecture. - Sleep hygiene should be reviewed to assess factors that may improve sleep quality. - Weight management and regular exercise should be initiated or continued. - Return to Sleep Center for re-evaluation after 10 weeks of therapy   Armanda Magic Diplomate, American Board of Sleep Medicine  ELECTRONICALLY SIGNED ON:  12/10/2016, 8:53 PM [facility] PH: [phone]   FX: [phone] ACCREDITED BY THE AMERICAN ACADEMY OF SLEEP MEDICINE

## 2016-12-12 ENCOUNTER — Telehealth: Payer: Self-pay | Admitting: *Deleted

## 2016-12-12 NOTE — Telephone Encounter (Signed)
-----   Message from Quintella Reichert, MD sent at 12/10/2016  8:58 PM EDT ----- Pt had successful PAP titration. Please setup appointment in 10 weeks. Please let [facility] know that order for PAP is in EPIC.

## 2016-12-12 NOTE — Telephone Encounter (Signed)
Informed patient of titration results and he verbalized understanding and agreed with treatment. Patient understands he will be contacted by [facility] to set up his cpap. He understands to call if [facility] does not contact him with new setup in a timely manner. He understands he will be called once confirmation has been received from [facility] that he has received his new machine to schedule 10 week follow up appointment.  [facility] notified of new cpap order in epic He was grateful for the call and thanked me

## 2016-12-14 ENCOUNTER — Encounter: Payer: Self-pay | Admitting: Internal Medicine

## 2016-12-14 MED FILL — metFORMIN HCL 1000 MG TABS: 1000 | 60 days supply | Qty: 120 | Fill #1

## 2016-12-20 ENCOUNTER — Other Ambulatory Visit: Payer: Medicare HMO

## 2016-12-20 DIAGNOSIS — Z0000 Encounter for general adult medical examination without abnormal findings: Secondary | ICD-10-CM

## 2016-12-20 LAB — IFOBT (OCCULT BLOOD): IFOBT: NEGATIVE

## 2016-12-20 NOTE — Progress Notes (Addendum)
12/21/2016  16:22 I spoke with Mr Adami and verified the collection date of the specimen for the IFOBT. Patient stated he collected the specimen on 12-09-2016. The specimen was received in the Clinic Lab on 12/20/2016.  Due to age of specimen (greater than 6 days) the results will be rejected and and recollection is recommended.  Maryan Rued, PBT Clinic Lab   12/20/2016  16:25 Called Mr Decklan Mau to verify collection date of IFOBT. No answer on home or mobile phone. Left voicemail on both phones for patient to return call to [place] [place] [facility] [phone]

## 2016-12-28 MED FILL — CARVEDILOL 12.5 MG TABLET: 12.5 | 30 days supply | Qty: 90 | Fill #1

## 2016-12-29 ENCOUNTER — Encounter: Payer: Self-pay | Admitting: Physical Medicine & Rehabilitation

## 2016-12-29 ENCOUNTER — Encounter: Payer: Medicare HMO | Attending: Physical Medicine & Rehabilitation

## 2016-12-29 ENCOUNTER — Ambulatory Visit (HOSPITAL_BASED_OUTPATIENT_CLINIC_OR_DEPARTMENT_OTHER): Payer: Medicare HMO | Admitting: Physical Medicine & Rehabilitation

## 2016-12-29 VITALS — BP 143/86 | HR 80

## 2016-12-29 DIAGNOSIS — R269 Unspecified abnormalities of gait and mobility: Secondary | ICD-10-CM

## 2016-12-29 DIAGNOSIS — E1142 Type 2 diabetes mellitus with diabetic polyneuropathy: Secondary | ICD-10-CM | POA: Diagnosis not present

## 2016-12-29 DIAGNOSIS — I69398 Other sequelae of cerebral infarction: Secondary | ICD-10-CM | POA: Diagnosis not present

## 2016-12-29 DIAGNOSIS — Z794 Long term (current) use of insulin: Secondary | ICD-10-CM | POA: Diagnosis not present

## 2016-12-29 DIAGNOSIS — E118 Type 2 diabetes mellitus with unspecified complications: Secondary | ICD-10-CM | POA: Insufficient documentation

## 2016-12-29 NOTE — Patient Instructions (Signed)
Fasting blood sugars should be 70-120  After meals less than 200

## 2016-12-29 NOTE — Progress Notes (Signed)
Subjective:    Patient ID: Eddie [name], male    DOB: [birth year], 66 y.o.   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 Feels funny on bottom of foot R>L Sleep study showed sleep apnea, now wing CPAP On Nebulizers for COPD Sees cardiology for CHF Podiatry for toenail clipping Diabetic control is poor per PCP Dr Mikey Bussing, metformin and Lantus  Ambulates with quad cane, no falls. Independent with bathing and dressing  Pain Inventory Average Pain 5 Pain Right Now 0 My pain is aching  In the last 24 hours, has pain interfered with the following? General activity 0 Relation with others 0 Enjoyment of life 0 What TIME of day is your pain at its worst? morning Sleep (in general) Fair  Pain is worse with: unsure Pain improves with: therapy/exercise and pacing activities Relief from Meds: .  Mobility walk without assistance walk with assistance use a cane ability to climb steps?  yes do you drive?  no  Function disabled: date disabled . I need assistance with the following:  meal prep  Neuro/Psych bladder control problems numbness  Prior Studies Any changes since last visit?  no  Physicians involved in your care Any changes since last visit?  no   Family History  Problem Relation Age of Onset  . Diabetes Mother   . Cancer Neg Hx    Social History   Social History  . Marital status: Married    Spouse name: N/A  . Number of children: N/A  . 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 Smoking status: Current Some Day Smoker  . Smokeless tobacco: Current User     Comment: 1 pk lasts 2 months for 1 year, used to do 1 pack every 2 weeks since 66 years of age.  . Alcohol use No  . Drug use: No  . Sexual activity: Not on file   Other Topics Concern  . Not on file   Social History Narrative  . No narrative on file   Past Surgical History:    Procedure Laterality Date  . CARDIAC CATHETERIZATION N/A 09/28/2015   Procedure: Left Heart Cath and Coronary Angiography;  Surgeon: Runell Gess, MD;  Location: [facility] INVASIVE CV LAB;  Service: Cardiovascular;  Laterality: N/A;  . CARDIAC CATHETERIZATION N/A 10/08/2015   Procedure: Left Heart Cath and Coronary Angiography;  Surgeon: Lyn Records, MD;  Location: [facility] INVASIVE CV LAB;  Service: Cardiovascular;  Laterality: N/A;  . CATARACT EXTRACTION Right   . Repair of Peptic Ulcer     Past Medical History:  Diagnosis Date  . Blindness of left eye   . CAD (coronary artery disease)    a. STEMI 09/2015 w/ DES to Prox LAD  . CVA (cerebral infarction)    a. 09/2015: acute small right frontal lobe infarct  . Diabetes (HCC)   . Hypertension   . Ischemic cardiomyopathy   . PUD (peptic ulcer disease)    There were no vitals taken for this visit.  Opioid Risk Score:   Fall Risk Score:  `1  Depression screen PHQ 2/9  Depression screen [facility] 2/9 12/07/2016 10/06/2016 11/09/2015  Decreased Interest 0 0 0  Down, Depressed, Hopeless 0 0 0  PHQ - 2 Score 0 0 0    Review of Systems  Constitutional: Negative.   HENT: Negative.  Eyes: Negative.   Respiratory: Negative.   Cardiovascular: Negative.   Gastrointestinal: Negative.   Endocrine: Negative.   Genitourinary: Negative.   Musculoskeletal: Negative.   Skin: Negative.   Allergic/Immunologic: Negative.   Neurological: Negative.   Hematological: Negative.   Psychiatric/Behavioral: Negative.   All other systems reviewed and are negative.      Objective:   Physical Exam  Constitutional: He is oriented to person, place, and time. He appears well-developed and well-nourished.  HENT:  Head: Normocephalic and atraumatic.  Eyes: Conjunctivae and EOM are normal. Pupils are equal, round, and reactive to light.  Neurological: He is alert and oriented to person, place, and time.  Motor strength is  Skin: Skin is warm and dry.  Nursing  note and vitals reviewed.  4/5 strength. The left deltoid by stress, grip, hip flexor, knee extensor, ankle dorsiflexor 5/5 on the right side. Ambulates with quad cane no evidence to drag or knee instability. Gen. Distress Mood and affect are appropriate  Pinprick exam on the lower limbs shows some patchy deficits. The dorsum of the toes are less sensate then the plantar surface of the toes but does have some decreased sensation in the ball of foot area bilaterally. No definite glove stocking pattern.    Assessment & Plan:  1. 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 Do not think any further physical therapy or occupational therapy needed at this time. He has plateaued in his function and has chronic deficits, but has achieved at least a modified level of independence  2. Congestive heart failure. Follow-up with CHF team  3. COPD. Follow-up pulmonology and primary care  4. Onychogryphosis, follow-up with podiatry  5. Diabetic neuropathy, recommend strict diabetic control, not painful, so I do not think Lyrica or gabapentin or needed

## 2017-01-01 MED FILL — ATORVASTATIN 80 MG TABLET: 80 | 30 days supply | Qty: 30 | Fill #3

## 2017-01-01 MED FILL — ENTRESTO 49 MG-51 MG TABLET: 49-51 | 30 days supply | Qty: 60 | Fill #5

## 2017-01-01 MED FILL — SPIRONOLACTONE 25 MG TABLET: 25 | 30 days supply | Qty: 30 | Fill #2

## 2017-01-03 ENCOUNTER — Other Ambulatory Visit (HOSPITAL_COMMUNITY): Payer: Self-pay | Admitting: Cardiology

## 2017-01-03 MED ORDER — FUROSEMIDE 20 MG PO TABS: 20.0000 mg | ORAL_TABLET | Freq: Two times a day (BID) | ORAL | 6 refills | Status: DC

## 2017-01-03 MED ORDER — CLOPIDOGREL BISULFATE 75 MG PO TABS: 75.0000 mg | ORAL_TABLET | Freq: Every day | ORAL | 6 refills | Status: DC

## 2017-01-03 MED FILL — FUROSEMIDE 20 MG TABLET: 20 | 30 days supply | Qty: 60 | Fill #0

## 2017-01-03 MED FILL — CLOPIDOGREL 75 MG TABLET: 75 | 30 days supply | Qty: 30 | Fill #0

## 2017-01-08 MED FILL — LANTUS SOLOSTAR 100 UNITS/M: 100 | 30 days supply | Qty: 3 | Fill #2

## 2017-01-11 ENCOUNTER — Ambulatory Visit (HOSPITAL_COMMUNITY)
Admission: RE | Admit: 2017-01-11 | Discharge: 2017-01-11 | Disposition: A | Discharge status: Home / Self Care | Payer: Medicare HMO | Source: Ambulatory Visit | Attending: Internal Medicine | Admitting: Internal Medicine

## 2017-01-11 ENCOUNTER — Encounter (HOSPITAL_COMMUNITY): Payer: Self-pay | Admitting: Internal Medicine

## 2017-01-11 ENCOUNTER — Ambulatory Visit (HOSPITAL_BASED_OUTPATIENT_CLINIC_OR_DEPARTMENT_OTHER)
Admission: RE | Admit: 2017-01-11 | Discharge: 2017-01-11 | Disposition: A | Discharge status: Home / Self Care | Payer: Medicare HMO | Source: Ambulatory Visit | Attending: Internal Medicine | Admitting: Internal Medicine

## 2017-01-11 VITALS — BP 149/80 | HR 77 | Wt 157.8 lb

## 2017-01-11 DIAGNOSIS — E119 Type 2 diabetes mellitus without complications: Secondary | ICD-10-CM | POA: Insufficient documentation

## 2017-01-11 DIAGNOSIS — R0609 Other forms of dyspnea: Secondary | ICD-10-CM | POA: Diagnosis not present

## 2017-01-11 DIAGNOSIS — J449 Chronic obstructive pulmonary disease, unspecified: Secondary | ICD-10-CM | POA: Diagnosis not present

## 2017-01-11 DIAGNOSIS — I509 Heart failure, unspecified: Secondary | ICD-10-CM | POA: Diagnosis present

## 2017-01-11 DIAGNOSIS — I5022 Chronic systolic (congestive) heart failure: Secondary | ICD-10-CM | POA: Diagnosis not present

## 2017-01-11 DIAGNOSIS — I5042 Chronic combined systolic (congestive) and diastolic (congestive) heart failure: Secondary | ICD-10-CM | POA: Diagnosis not present

## 2017-01-11 DIAGNOSIS — I10 Essential (primary) hypertension: Secondary | ICD-10-CM | POA: Diagnosis not present

## 2017-01-11 DIAGNOSIS — I110 Hypertensive heart disease with heart failure: Secondary | ICD-10-CM | POA: Diagnosis not present

## 2017-01-11 DIAGNOSIS — F17210 Nicotine dependence, cigarettes, uncomplicated: Secondary | ICD-10-CM | POA: Insufficient documentation

## 2017-01-11 LAB — BASIC METABOLIC PANEL
ANION GAP: 10 (ref 5–15)
BUN: 14 mg/dL (ref 6–20)
CO2: 27 mmol/L (ref 22–32)
Calcium: 8.6 mg/dL — ABNORMAL LOW (ref 8.9–10.3)
Chloride: 104 mmol/L (ref 101–111)
Creatinine, Ser: 0.96 mg/dL (ref 0.61–1.24)
GLUCOSE: 140 mg/dL — AB (ref 65–99)
POTASSIUM: 3.3 mmol/L — AB (ref 3.5–5.1)
SODIUM: 141 mmol/L (ref 135–145)

## 2017-01-11 LAB — PULMONARY FUNCTION TEST
DL/VA % pred: 74 %
DL/VA: 3.25 ml/min/mmHg/L
DLCO UNC % PRED: 54 %
DLCO UNC: 14.57 ml/min/mmHg
FEF 25-75 PRE: 1.72 L/s
FEF 25-75 Post: 1.93 L/sec
FEF2575-%CHANGE-POST: 11 %
FEF2575-%PRED-PRE: 74 %
FEF2575-%Pred-Post: 83 %
FEV1-%Change-Post: 3 %
FEV1-%PRED-POST: 83 %
FEV1-%Pred-Pre: 80 %
FEV1-Post: 2.11 L
FEV1-Pre: 2.03 L
FEV1FVC-%CHANGE-POST: 3 %
FEV1FVC-%Pred-Pre: 94 %
FEV6-%CHANGE-POST: 0 %
FEV6-%Pred-Post: 87 %
FEV6-%Pred-Pre: 87 %
FEV6-Post: 2.76 L
FEV6-Pre: 2.79 L
FEV6FVC-%Change-Post: -1 %
FEV6FVC-%Pred-Post: 104 %
FEV6FVC-%Pred-Pre: 105 %
FVC-%CHANGE-POST: 0 %
FVC-%Pred-Post: 83 %
FVC-%Pred-Pre: 83 %
FVC-Post: 2.79 L
FVC-Pre: 2.79 L
POST FEV1/FVC RATIO: 76 %
POST FEV6/FVC RATIO: 99 %
PRE FEV1/FVC RATIO: 73 %
Pre FEV6/FVC Ratio: 100 %
RV % pred: 87 %
RV: 1.87 L
TLC % pred: 77 %
TLC: 4.78 L

## 2017-01-11 MED ORDER — SACUBITRIL-VALSARTAN 97-103 MG PO TABS: 1.0000 | ORAL_TABLET | Freq: Two times a day (BID) | ORAL | 3 refills | Status: DC

## 2017-01-11 MED ORDER — ALBUTEROL SULFATE (2.5 MG/3ML) 0.083% IN NEBU
2.5000 mg | INHALATION_SOLUTION | Freq: Once | RESPIRATORY_TRACT | Status: AC
  Administered 2017-01-11: 2.5 mg via RESPIRATORY_TRACT

## 2017-01-11 MED FILL — ENTRESTO 97 MG-103 MG TAB: 97-103 | 30 days supply | Qty: 60 | Fill #0

## 2017-01-11 NOTE — Progress Notes (Signed)
Advanced Heart Failure Clinic Note    HPI:  Eddie [name] is a 66 y/o male with h/o DM2, CAD s/p NSTEMI with LAD stent in 3/17, CVA, chronic systolic HF with EF 35-32%.   Had multiple admissions in May and June 2017 in the setting of decompensated CHF and EColi bacteremia. Readmitted in June 2017 with acute respiratory failure requiring intubation in setting of severe HTN and ADHF. Treated with milrinone which was eventually weaned off.     He presents today for HF follow up. He says he feels pretty good, denies SOB with walking. No SOB with walking around the grocery store, his is limited by right upper leg pain. He is taking all medications. He took extra lasix yesterday for a weight of 159 pounds. Usually around 157 pounds. He did urinate more with the extra lasix. Eating spam, along with other high salt foods. He is drinking more than 2L a day.   Echo 01/11/17: EF 40%.   RV ok. Personally reviewed  ECHO 06/19/2016: EF 20-25%.Cannot exclude small  laminar apical thrombus. No mobile or protuberant thrombus is  seen. Echo 5/15 EF 15-20%   Past Medical History:  Diagnosis Date  . Blindness of left eye   . CAD (coronary artery disease)    a. STEMI 09/2015 w/ DES to Prox LAD  . CVA (cerebral infarction)    a. 09/2015: acute small right frontal lobe infarct  . Diabetes ([place])   . Hypertension   . Ischemic cardiomyopathy   . PUD (peptic ulcer disease)     Current Outpatient Prescriptions  Medication Sig Dispense Refill  . Acetaminophen (TYLENOL) 325 MG CAPS Take 2 tablets by mouth every 4 (four) hours as needed.    Marland Kitchen albuterol (PROVENTIL HFA;VENTOLIN HFA) 108 (90 Base) MCG/ACT inhaler Inhale 2 puffs into the lungs every 6 (six) hours as needed for wheezing or shortness of breath. 1 Inhaler 2  . aspirin EC 81 MG tablet Take 1 tablet (81 mg total) by mouth daily. 90 tablet 3  . atorvastatin (LIPITOR) 80 MG tablet TAKE 1 TABLET BY MOUTH ONCE DAILY AT 6 PM. 30 tablet 5  . blood glucose  meter kit and supplies KIT Dispense based on patient and insurance preference. Use up to four times daily as directed. (FOR ICD-9 250.00, 250.01). 1 each 0  . carvedilol (COREG) 12.5 MG tablet Take 1.5 tablets (18.75 mg total) by mouth 2 (two) times daily. 270 tablet 3  . clopidogrel (PLAVIX) 75 MG tablet Take 1 tablet (75 mg total) by mouth daily. 30 tablet 6  . furosemide (LASIX) 20 MG tablet Take 1 tablet (20 mg total) by mouth 2 (two) times daily. 60 tablet 6  . Insulin Glargine (LANTUS) 100 UNIT/ML Solostar Pen Inject 10 Units into the skin daily at 10 pm. 15 mL 0  . Insulin Pen Needle 31G X 5 MM MISC 1 each by Does not apply route 3 (three) times daily before meals. 90 each 0  . ipratropium-albuterol (DUONEB) 0.5-2.5 (3) MG/3ML SOLN Take 3 mLs by nebulization every 6 (six) hours as needed (shortness of breath). 90 mL 0  . metFORMIN (GLUCOPHAGE) 1000 MG tablet Take 1 tablet (1,000 mg total) by mouth 2 (two) times daily with a meal. 180 tablet 3  . nitroGLYCERIN (NITROSTAT) 0.4 MG SL tablet Use one pill every 5 minutes X 3 if needed for chest pain. Contact MD if pain does not improve 30 tablet 0  . sacubitril-valsartan (ENTRESTO) 49-51 MG Take 1  tablet by mouth 2 (two) times daily. 60 tablet 5  . spironolactone (ALDACTONE) 25 MG tablet Take 1 tablet (25 mg total) by mouth daily. 30 tablet 5   No current facility-administered medications for this encounter.     Allergies  Allergen Reactions  . Bidil [Isosorb Dinitrate-Hydralazine] Other (See Comments)    Patient became lightheaded with 1 tablet tid.       Social History   Social History  . Marital status: Married    Spouse name: N/A  . Number of children: N/A  . 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 Smoking status: Current Some Day Smoker  . Smokeless tobacco: Current User     Comment: 1 pk lasts 2 months for 1 year, used to do 1 pack every 2 weeks since  66 years of age.  . Alcohol use No  . Drug use: No  . Sexual activity: Not on file   Other Topics Concern  . Not on file   Social History Narrative  . No narrative on file      Family History  Problem Relation Age of Onset  . Diabetes Mother   . Cancer Neg Hx     Vitals:   01/11/17 1148  BP: (!) 149/80  Pulse: 77  SpO2: 100%  Weight: 157 lb 12.8 oz (71.6 kg)   Wt Readings from Last 3 Encounters:  01/11/17 157 lb 12.8 oz (71.6 kg)  12/07/16 161 lb 11.2 oz (73.3 kg)  11/26/16 160 lb (72.6 kg)     PHYSICAL EXAM: General:  Thin male, NAD. Walked into clinic with a cane.  HEENT: Poor dentition.  Neck: supple. JVD to jaw Carotids 2+ bilat; no bruits. No thyromegaly or nodule noted. Cor: PMI nondisplaced. Regular rate and rhythm. No M/G/R noted Lungs: Clear bilaterally, normal effort Abdomen: soft, non-tender, non distended, no HSM. No bruits or masses. Bowel sounds present Extremities: no cyanosis, clubbing, rash, 1-2+ pedal edema bilaterally.  Neuro: alert & orientedx3, cranial nerves grossly intact. Remains weak in RLE otherwise intact  Affect pleasant   ASSESSMENT & PLAN: 1. Chronicsystolic CHF : ICM May 9450 EF 15-20% and 12/17 EF 25-30%. Echo 12/2016 40% - NYHA II - Volume elevated today on exam in the setting of dietary noncompliance. He has increased his Lasix to 20 mg BID yesterday, continue BID today and then can go back to taking 20 mg daily tomorrow.  - Continue Coreg 18.75 mg BID - Increase Entresto to 97/103 mg BID - Continue spirolactone 25 mg daily.  - Intolerant to Bidil in the past with dizziness and headaches.  - Reinforced need to restrict fluid and salt in his diet.  - Reinforced fluid restriction to < 2 L daily, sodium restriction to less than 2000 mg daily, and the importance of daily weights.   - EF now improved to 40%, no longer needs ICD. Will await formal echo read  2. CAD: s/p DES to proximal LAD in 09/2015  - no signs or symptoms of  ischemia - Continue ASA and Plavix  - Continue atorvastatin 52m daily.  3. HTN - Hypertensive today, will increase Entresto as above.  4. CVA, presumed cardio-embolic - coumadin stopped for noncompliance.  - Continue ASA and Plavix.  4. DMII - Follows with Internal Medicine clinic.  5. OSA - Has recent CPAP titration. Will return for re eval in 10 weeks.  6.  Tobacco use - Encouraged cessation.   Follow up in 2 months. BMET today and again in 10 days after increasing Entresto.   Eddie Leas, NP  12:07 PM   Patient seen and examined with Eddie Booze, NP. We discussed all aspects of the encounter. I agree with the assessment and plan as stated above.   Overall doing better. NYHA II-III. Volume status up today though. Will double lasix for 3 days then return to baseline. Increase Entresto to 97/103 bid. Reinforced need for daily weights and reviewed use of sliding scale diuretics. Echo reviewed personally EF ~40%. No need for ICD at this point.   Eddie Bickers, MD  12:43 PM

## 2017-01-11 NOTE — Addendum Note (Signed)
Encounter addended by: Modesta Messing, CMA on: 01/11/2017 12:52 PM<BR>    Actions taken: Order list changed, Diagnosis association updated, Sign clinical note

## 2017-01-11 NOTE — Patient Instructions (Signed)
INCREASE Entresto to 97/103mg  twice daily.  TAKE Lasix 40mg  twice daily today and tomorrow ONLY.    Routine lab work today. Will notify you of abnormal results  Repeat labs in 10 days (bmet)  Follow up with Dr.Bensimhon in 2 months

## 2017-01-11 NOTE — Progress Notes (Signed)
Advanced Heart Failure Medication Review by a Pharmacist  Does the patient  feel that his/her medications are working for him/her?  yes  Has the patient been experiencing any side effects to the medications prescribed?  no  Does the patient measure his/her own blood pressure or blood glucose at home?  yes   Does the patient have any problems obtaining medications due to transportation or finances?   no  Understanding of regimen: good Understanding of indications: good Potential of compliance: good Patient understands to avoid NSAIDs. Patient understands to avoid decongestants.  Issues to address at subsequent visits: none.   Pharmacist comments: Eddie [name] is a 66 y/o male who presents today with a medication list. He reports good adherence to his medications. Patient takes furosemide 20 mg PO BID, but took 40 mg PO BID yesterday (01/10/2017) due to increased edema in his feet. Patient repots no medication-related questions or concerns at this time.   Marlinda Mike PharmD Candidate   Time with patient: 10 minutes Preparation and documentation time: 5 minutes Total time: 15 minutes

## 2017-01-11 NOTE — Progress Notes (Signed)
  Echocardiogram 2D Echocardiogram has been performed.  Eddie [name] 01/11/2017, 11:15 AM

## 2017-01-25 ENCOUNTER — Ambulatory Visit (HOSPITAL_COMMUNITY)
Admission: RE | Admit: 2017-01-25 | Discharge: 2017-01-25 | Disposition: A | Discharge status: Home / Self Care | Payer: Medicare HMO | Source: Ambulatory Visit | Attending: Internal Medicine | Admitting: Internal Medicine

## 2017-01-25 DIAGNOSIS — I5022 Chronic systolic (congestive) heart failure: Secondary | ICD-10-CM | POA: Insufficient documentation

## 2017-01-25 LAB — BASIC METABOLIC PANEL
Anion gap: 7 (ref 5–15)
BUN: 18 mg/dL (ref 6–20)
CO2: 30 mmol/L (ref 22–32)
CREATININE: 1.2 mg/dL (ref 0.61–1.24)
Calcium: 8.4 mg/dL — ABNORMAL LOW (ref 8.9–10.3)
Chloride: 104 mmol/L (ref 101–111)
GFR calc Af Amer: >60 mL/min (ref >60)
GFR calc non Af Amer: >60 mL/min (ref >60)
GLUCOSE: 170 mg/dL — AB (ref 65–99)
Potassium: 3.5 mmol/L (ref 3.5–5.1)
Sodium: 141 mmol/L (ref 135–145)

## 2017-01-26 MED FILL — FUROSEMIDE 20 MG TABLET: 20 | 30 days supply | Qty: 60 | Fill #1

## 2017-01-31 ENCOUNTER — Ambulatory Visit (INDEPENDENT_AMBULATORY_CARE_PROVIDER_SITE_OTHER): Payer: Medicare HMO | Admitting: Dietician

## 2017-01-31 ENCOUNTER — Other Ambulatory Visit: Payer: Self-pay | Admitting: Dietician

## 2017-01-31 VITALS — Ht 66.0 in | Wt 160.1 lb

## 2017-01-31 DIAGNOSIS — Z6825 Body mass index (BMI) 25.0-25.9, adult: Secondary | ICD-10-CM

## 2017-01-31 DIAGNOSIS — E119 Type 2 diabetes mellitus without complications: Secondary | ICD-10-CM | POA: Diagnosis not present

## 2017-01-31 DIAGNOSIS — Z713 Dietary counseling and surveillance: Secondary | ICD-10-CM | POA: Diagnosis not present

## 2017-01-31 DIAGNOSIS — E1142 Type 2 diabetes mellitus with diabetic polyneuropathy: Secondary | ICD-10-CM

## 2017-01-31 DIAGNOSIS — Z794 Long term (current) use of insulin: Principal | ICD-10-CM

## 2017-01-31 DIAGNOSIS — IMO0002 Reserved for concepts with insufficient information to code with codable children: Secondary | ICD-10-CM

## 2017-01-31 DIAGNOSIS — E1140 Type 2 diabetes mellitus with diabetic neuropathy, unspecified: Secondary | ICD-10-CM

## 2017-01-31 DIAGNOSIS — E1165 Type 2 diabetes mellitus with hyperglycemia: Principal | ICD-10-CM

## 2017-01-31 LAB — POCT GLYCOSYLATED HEMOGLOBIN (HGB A1C): HEMOGLOBIN A1C: 6.8

## 2017-01-31 LAB — GLUCOSE, CAPILLARY: GLUCOSE-CAPILLARY: 84 mg/dL (ref 65–99)

## 2017-01-31 MED ORDER — GLUCOSE BLOOD VI STRP: ORAL_STRIP | 5 refills | Status: DC

## 2017-01-31 MED ORDER — TRUEPLUS LANCETS 30G MISC: 5 refills | Status: DC

## 2017-01-31 MED ORDER — INSULIN PEN NEEDLE 31G X 5 MM MISC: 5 refills | Status: DC

## 2017-01-31 NOTE — Progress Notes (Addendum)
  Medical Nutrition Therapy:  Appt start time: 1035 end time:  1145. Visit # 1  Assessment:  Primary concerns today: diabetes education.  "I want to do whatever is good for me to do."  Mr. Wakley is here with his wife today. 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 He has a family history of insulin controlled diabetes in his mother and brother. They are currently not ready to attend diabetes classes but will let us know if and when they are. They do want to do MNT for his diabetes here. Since he checks blood sugar only fasting daily, had lab do a1C. A1C is now 6.8 decreased from 12.4% that shows he is maintaining blood sugar control  throughout the day Preferred Learning Style: No preference indicated  Learning Readiness: Ready and Change in progress  ANTHROPOMETRICS: weight-160.1#, height-66", BMI-25.84 WEIGHT HISTORY: stable SLEEP:not assessed today MEDICATIONS: lantus 10 units BLOOD SUGAR:A1C 6.8% today, Meter download shows average of 147, 27 readings in a month, 30% in target, 70% above target- most of his readings are fasting and above 130.  DIETARY INTAKE: Usual eating pattern includes 3 meals and 0-1 snacks per day. Dining Out (times/week): 7x/week Everyday foods include breakfast foods.  Avoided foods include greens and cranberries he was told not to eat when on coumadin and had pneumonia.   24-hr recall:  B ( AM): eggs, pancakes, juice L ( PM): fried chicken or  D ( PM):[employer] steak, cheese and egg biscuit and sweet tea Beverages: coffee with sugar~ 1 tsp and cream, McDs sweet tea, homemade sweet tea  Usual physical activity: walks a lot and would like to run someday  Progress Towards Goal(s):  In progress.   Nutritional Diagnosis:  NB-1.1 Food and nutrition-related knowledge deficit As related to lack of prior diabetes training.  As evidenced by theri questions and report.    Intervention:  Nutrition education about general diabetes and diabetes meal planning  encouraged more high nutrient foods ( fruits, vegetables and whole grains) for overall health. Coordination of care: requested Teaching Method Utilized: Visual, Auditory, Hands on Handouts given during visit include:Living with Type 2 diabetes, A1C sheet Barriers to learning/adherence to lifestyle change: material resources Demonstrated degree of understanding via:  Teach Back   Monitoring/Evaluation:  Dietary intake, exercise, meter, and body weight in 4 week(s)  Plyler, [name], RD 01/31/2017 12:00 PM. .

## 2017-01-31 NOTE — Telephone Encounter (Signed)
Patient requests refill on diabetes supplies

## 2017-01-31 NOTE — Patient Instructions (Signed)
Your A1C is now at goal of less than 7%. Good Job! It was 6.8%   You can check blood sugar anytime you are curious to know how a food affects your blood sugar.

## 2017-02-12 MED FILL — metFORMIN HCL 1000 MG TABS: 1000 | 60 days supply | Qty: 120 | Fill #2

## 2017-02-12 MED FILL — ENTRESTO 97 MG-103 MG TAB: 97-103 | 30 days supply | Qty: 60 | Fill #1

## 2017-02-15 MED FILL — UNIFINE PENTIPS 31GX3/16: 31G X 5 MM | 90 days supply | Qty: 100 | Fill #0

## 2017-02-15 MED FILL — SPIRONOLACTONE 25 MG TABLET: 25 | 30 days supply | Qty: 30 | Fill #3

## 2017-02-15 MED FILL — LANTUS SOLOSTAR 100 UNITS/M: 100 | 30 days supply | Qty: 3 | Fill #3

## 2017-02-15 MED FILL — CARVEDILOL 12.5 MG TABLET: 12.5 | 30 days supply | Qty: 90 | Fill #2

## 2017-02-15 MED FILL — UNIFINE PENTIPS 31GX3/16": 31G X 5 MM | 90 days supply | Qty: 100 | Fill #0

## 2017-02-15 MED FILL — ATORVASTATIN 80 MG TABLET: 80 | 30 days supply | Qty: 30 | Fill #4

## 2017-02-15 MED FILL — CLOPIDOGREL 75 MG TABLET: 75 | 30 days supply | Qty: 30 | Fill #1

## 2017-02-22 MED FILL — FUROSEMIDE 20 MG TABLET: 20 | 30 days supply | Qty: 60 | Fill #2

## 2017-03-02 ENCOUNTER — Ambulatory Visit (INDEPENDENT_AMBULATORY_CARE_PROVIDER_SITE_OTHER): Payer: Medicare HMO | Admitting: Dietician

## 2017-03-02 DIAGNOSIS — Z794 Long term (current) use of insulin: Secondary | ICD-10-CM

## 2017-03-02 DIAGNOSIS — E1165 Type 2 diabetes mellitus with hyperglycemia: Secondary | ICD-10-CM

## 2017-03-02 DIAGNOSIS — E1140 Type 2 diabetes mellitus with diabetic neuropathy, unspecified: Secondary | ICD-10-CM

## 2017-03-02 DIAGNOSIS — Z713 Dietary counseling and surveillance: Secondary | ICD-10-CM

## 2017-03-02 DIAGNOSIS — IMO0002 Reserved for concepts with insufficient information to code with codable children: Secondary | ICD-10-CM

## 2017-03-02 LAB — GLUCOSE, CAPILLARY: GLUCOSE-CAPILLARY: 137 mg/dL — AB (ref 65–99)

## 2017-03-02 MED FILL — TRUEplus LANCETS 30G MISC: 50 days supply | Qty: 100 | Fill #0

## 2017-03-02 MED FILL — TRUE METRIX GLUCOSE TEST ST: 50 days supply | Qty: 100 | Fill #0

## 2017-03-02 NOTE — Patient Instructions (Addendum)
You are doing great eating healthy and taking care of your diabetes.  Try to wean yourself off off sugared Koolaid and onto 100% vegetable or fruit juice and sugarless drinks, coffee or milk or tea with non nutritive sweetener.  Your appointment at Dr.Hecker's office is Friday October 5th 9:45  [facility] Ophthalm[place] Address: [address], [place], [place] [address] Phone: [phone]

## 2017-03-02 NOTE — Progress Notes (Signed)
  Medical Nutrition Therapy:  Appt start time: 1020 end time:  1120. Visit # 2  Assessment:  Primary concerns today: diabetes education.   Eddie [name] is here with his wife today. He has lost 8# in the past month from improvements in his diet, more vegetables, lean protein. He has been to podiatry and checks his feet daily. He smokes about 1 cigarette per month and lights his wife;s cigarettes  ANTHROPOMETRICS: weight-152.1#, height-66", BMI-24.55 MEDICATIONS: lantus 10 units, metformin BLOOD SUGAR:137 after coffee with cream & sugar today, Meter download shows average of 134, 19 readings in a month, 53% in target, 47% above target- most of his readings are fasting and above 130.  DIETARY INTAKE: Usual eating pattern includes 3 meals and 0-1 snacks per day. Dining Out (times/week): 7x/week.   24-hr recall:  B ( AM): cereal, juice, coffee L ( PM):tossed salad with light dressing, chicken or fish Kooloaid D ( PM): Chicken patty, mashed potatoes, green beans, tea Beverages: coffee with sugar~ 1 tsp and cream, McDs sweet tea, homemade sweet tea  Usual physical activity: walks a lot and would like to run someday  Progress Towards Goal(s):  In progress.   Nutritional Diagnosis:  NB-1.1 Food and nutrition-related knowledge deficit As related to lack of prior diabetes training improving.  As evidenced by theri questions and report.    Intervention:  Nutrition education about general diabetes and diabetes meal planning encouraged more high nutrient foods ( fruits, vegetables and whole grains) for overall health. Coordination of care: request ophthalmology to Dr. Elmer Picker Teaching Method Utilized: Visual, Auditory, Hands on Handouts given during visit include:Living with Type 2 diabetes, A1C sheet Barriers to learning/adherence to lifestyle change: material resources Demonstrated degree of understanding via:  Teach Back   Monitoring/Evaluation:  Dietary intake, exercise, meter, and body weight in 8  week(s)  Eddie [name], Eddie [name], RD 03/02/2017 10:20 AM. .

## 2017-03-05 NOTE — Telephone Encounter (Signed)
LMTCB  03/05/17. 

## 2017-03-05 NOTE — Addendum Note (Signed)
Addended by: Carlynn Purl C on: 03/05/2017 01:36 PM   Modules accepted: Orders

## 2017-03-14 MED FILL — CLOPIDOGREL 75 MG TABLET: 75 | 30 days supply | Qty: 30 | Fill #2

## 2017-03-14 MED FILL — SPIRONOLACTONE 25 MG TABLET: 25 | 30 days supply | Qty: 30 | Fill #4

## 2017-03-14 MED FILL — ENTRESTO 97 MG-103 MG TAB: 97-103 | 30 days supply | Qty: 60 | Fill #2

## 2017-03-14 MED FILL — ATORVASTATIN 80 MG TABLET: 80 | 30 days supply | Qty: 30 | Fill #5

## 2017-03-14 MED FILL — LANTUS SOLOSTAR 100 UNITS/M: 100 | 30 days supply | Qty: 3 | Fill #4

## 2017-03-14 MED FILL — CARVEDILOL 12.5 MG TABLET: 12.5 | 30 days supply | Qty: 90 | Fill #3

## 2017-03-15 ENCOUNTER — Ambulatory Visit (HOSPITAL_COMMUNITY)
Admission: RE | Admit: 2017-03-15 | Discharge: 2017-03-15 | Disposition: A | Discharge status: Home / Self Care | Payer: Medicare HMO | Source: Ambulatory Visit | Attending: Internal Medicine | Admitting: Internal Medicine

## 2017-03-15 ENCOUNTER — Encounter (HOSPITAL_COMMUNITY): Payer: Self-pay | Admitting: Internal Medicine

## 2017-03-15 VITALS — BP 164/83 | HR 79 | Wt 150.0 lb

## 2017-03-15 DIAGNOSIS — I110 Hypertensive heart disease with heart failure: Secondary | ICD-10-CM | POA: Insufficient documentation

## 2017-03-15 DIAGNOSIS — I5022 Chronic systolic (congestive) heart failure: Secondary | ICD-10-CM | POA: Insufficient documentation

## 2017-03-15 DIAGNOSIS — Z7982 Long term (current) use of aspirin: Secondary | ICD-10-CM | POA: Diagnosis not present

## 2017-03-15 DIAGNOSIS — Z8673 Personal history of transient ischemic attack (TIA), and cerebral infarction without residual deficits: Secondary | ICD-10-CM | POA: Diagnosis not present

## 2017-03-15 DIAGNOSIS — R51 Headache: Secondary | ICD-10-CM | POA: Diagnosis not present

## 2017-03-15 DIAGNOSIS — G4733 Obstructive sleep apnea (adult) (pediatric): Secondary | ICD-10-CM | POA: Diagnosis not present

## 2017-03-15 DIAGNOSIS — I252 Old myocardial infarction: Secondary | ICD-10-CM | POA: Insufficient documentation

## 2017-03-15 DIAGNOSIS — Z79899 Other long term (current) drug therapy: Secondary | ICD-10-CM | POA: Insufficient documentation

## 2017-03-15 DIAGNOSIS — Z9114 Patient's other noncompliance with medication regimen: Secondary | ICD-10-CM | POA: Insufficient documentation

## 2017-03-15 DIAGNOSIS — R42 Dizziness and giddiness: Secondary | ICD-10-CM | POA: Insufficient documentation

## 2017-03-15 DIAGNOSIS — Z8711 Personal history of peptic ulcer disease: Secondary | ICD-10-CM | POA: Diagnosis not present

## 2017-03-15 DIAGNOSIS — F17200 Nicotine dependence, unspecified, uncomplicated: Secondary | ICD-10-CM | POA: Insufficient documentation

## 2017-03-15 DIAGNOSIS — E119 Type 2 diabetes mellitus without complications: Secondary | ICD-10-CM | POA: Diagnosis not present

## 2017-03-15 DIAGNOSIS — Z794 Long term (current) use of insulin: Secondary | ICD-10-CM | POA: Insufficient documentation

## 2017-03-15 DIAGNOSIS — I5042 Chronic combined systolic (congestive) and diastolic (congestive) heart failure: Secondary | ICD-10-CM

## 2017-03-15 DIAGNOSIS — Z888 Allergy status to other drugs, medicaments and biological substances status: Secondary | ICD-10-CM | POA: Insufficient documentation

## 2017-03-15 DIAGNOSIS — Z7902 Long term (current) use of antithrombotics/antiplatelets: Secondary | ICD-10-CM | POA: Insufficient documentation

## 2017-03-15 DIAGNOSIS — I10 Essential (primary) hypertension: Secondary | ICD-10-CM | POA: Diagnosis not present

## 2017-03-15 DIAGNOSIS — I255 Ischemic cardiomyopathy: Secondary | ICD-10-CM | POA: Insufficient documentation

## 2017-03-15 DIAGNOSIS — Z955 Presence of coronary angioplasty implant and graft: Secondary | ICD-10-CM | POA: Diagnosis not present

## 2017-03-15 DIAGNOSIS — I2510 Atherosclerotic heart disease of native coronary artery without angina pectoris: Secondary | ICD-10-CM | POA: Diagnosis not present

## 2017-03-15 MED ORDER — CARVEDILOL 25 MG PO TABS: 25.0000 mg | ORAL_TABLET | Freq: Two times a day (BID) | ORAL | 3 refills | Status: DC

## 2017-03-15 MED ORDER — AMLODIPINE BESYLATE 5 MG PO TABS: 5.0000 mg | ORAL_TABLET | Freq: Every day | ORAL | 3 refills | Status: DC

## 2017-03-15 MED ORDER — FUROSEMIDE 20 MG PO TABS
20.0000 mg | ORAL_TABLET | Freq: Every day | ORAL | 6 refills | Status: DC
Start: 2017-03-15 — End: 2018-02-28

## 2017-03-15 MED FILL — AMLODIPINE BESYLATE 5 MG TA: 5 | 30 days supply | Qty: 30 | Fill #0

## 2017-03-15 NOTE — Patient Instructions (Addendum)
Labs today (will call for abnormal results, otherwise no news is good news)  START Norvasc 5 mg (1 Tablet) Once Daily  INCREASE Carvedilol to 25 mg Twice Daily  DECREASE Lasix to 20 mg (1 Tablet) Once Daily  Follow up in 4 months.

## 2017-03-15 NOTE — Progress Notes (Signed)
Advanced Heart Failure Clinic Note    HPI:  [name] is a 66 y/o male with h/o DM2, CAD s/p NSTEMI with LAD stent in 3/17, CVA, chronic systolic HF with EF 25-95%.   Had multiple admissions in May and June 2017 in the setting of decompensated CHF and EColi bacteremia. Readmitted in June 2017 with acute respiratory failure requiring intubation in setting of severe HTN and ADHF. Treated with milrinone which was eventually weaned off.     He presents today for HF follow up. Denies SOB with walking around the grocery store or with stairs. He is taking all medications, eating some high salt foods, drinking more than 2L a day. Denies dizziness when changing positions, chest pain, palpitations. Weights at home trending down, down 7 pounds since last visit.   Echo 01/11/17: EF 40%.   RV ok. Personally reviewed ECHO 06/19/2016: EF 20-25%.Cannot exclude small  laminar apical thrombus. No mobile or protuberant thrombus is  seen. Echo 5/15 EF 15-20%   Past Medical History:  Diagnosis Date  . Blindness of left eye   . CAD (coronary artery disease)    a. STEMI 09/2015 w/ DES to Prox LAD  . CVA (cerebral infarction)    a. 09/2015: acute small right frontal lobe infarct  . Diabetes ([place])   . Hypertension   . Ischemic cardiomyopathy   . PUD (peptic ulcer disease)     Current Outpatient Prescriptions  Medication Sig Dispense Refill  . Acetaminophen (TYLENOL) 325 MG CAPS Take 2 tablets by mouth every 4 (four) hours as needed.    Marland Kitchen albuterol (PROVENTIL HFA;VENTOLIN HFA) 108 (90 Base) MCG/ACT inhaler Inhale 2 puffs into the lungs every 6 (six) hours as needed for wheezing or shortness of breath. 1 Inhaler 2  . aspirin EC 81 MG tablet Take 1 tablet (81 mg total) by mouth daily. 90 tablet 3  . atorvastatin (LIPITOR) 80 MG tablet TAKE 1 TABLET BY MOUTH ONCE DAILY AT 6 PM. 30 tablet 5  . blood glucose meter kit and supplies KIT Dispense based on patient and insurance preference. Use up to four times  daily as directed. (FOR ICD-9 250.00, 250.01). 1 each 0  . carvedilol (COREG) 12.5 MG tablet Take 1.5 tablets (18.75 mg total) by mouth 2 (two) times daily. 270 tablet 3  . clopidogrel (PLAVIX) 75 MG tablet Take 1 tablet (75 mg total) by mouth daily. 30 tablet 6  . furosemide (LASIX) 20 MG tablet Take 1 tablet (20 mg total) by mouth 2 (two) times daily. 60 tablet 6  . glucose blood (TRUE METRIX BLOOD GLUCOSE TEST) test strip Use to check blood sugar two times a day 100 each 5  . Insulin Glargine (LANTUS) 100 UNIT/ML Solostar Pen Inject 10 Units into the skin daily at 10 pm. 15 mL 0  . Insulin Pen Needle 31G X 5 MM MISC Use to inject insulin one time a day 100 each 5  . ipratropium-albuterol (DUONEB) 0.5-2.5 (3) MG/3ML SOLN Take 3 mLs by nebulization every 6 (six) hours as needed (shortness of breath). 90 mL 0  . metFORMIN (GLUCOPHAGE) 1000 MG tablet Take 1 tablet (1,000 mg total) by mouth 2 (two) times daily with a meal. 180 tablet 3  . nitroGLYCERIN (NITROSTAT) 0.4 MG SL tablet Use one pill every 5 minutes X 3 if needed for chest pain. Contact MD if pain does not improve 30 tablet 0  . sacubitril-valsartan (ENTRESTO) 97-103 MG Take 1 tablet by mouth 2 (two) times daily.  60 tablet 3  . spironolactone (ALDACTONE) 25 MG tablet Take 1 tablet (25 mg total) by mouth daily. 30 tablet 5  . TRUEPLUS LANCETS 30G MISC Use to check blood sugar two times a day 100 each 5   No current facility-administered medications for this encounter.     Allergies  Allergen Reactions  . Bidil [Isosorb Dinitrate-Hydralazine] Other (See Comments)    Patient became lightheaded with 1 tablet tid.       Social History   Social History  . Marital status: Married    Spouse name: N/A  . Number of children: N/A  . 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 Smoking status: Current Some Day Smoker  . Smokeless tobacco: Current User     Comment: 1  pk lasts 2 months for 1 year, used to do 1 pack every 2 weeks since 66 years of age.  . Alcohol use No  . Drug use: No  . Sexual activity: Not on file   Other Topics Concern  . Not on file   Social History Narrative  . No narrative on file      Family History  Problem Relation Age of Onset  . Diabetes Mother   . Cancer Neg Hx     Vitals:   03/15/17 1421  BP: (!) 164/83  Pulse: 79  SpO2: 93%  Weight: 150 lb (68 kg)   Wt Readings from Last 3 Encounters:  03/15/17 150 lb (68 kg)  03/02/17 152 lb 1.6 oz (69 kg)  01/31/17 160 lb 1.6 oz (72.6 kg)     PHYSICAL EXAM: General: Well appearing. No resp difficulty. HEENT: Normal Neck: Supple. JVP 7-8 cm. Carotids 2+ bilat; no bruits. No thyromegaly or nodule noted. Cor: PMI nondisplaced. RRR, No M/G/R noted Lungs: CTAB, normal effort. Abdomen: Soft, non-tender, non-distended, no HSM. No bruits or masses. +BS  Extremities: No cyanosis, clubbing, rash, R and LLE no edema.  Neuro: Alert & orientedx3, cranial nerves grossly intact. moves all 4 extremities w/o difficulty. Affect pleasant   ASSESSMENT & PLAN: 1. Chronicsystolic CHF : ICM May 4166 EF 15-20% and 12/17 EF 25-30%. Echo 12/2016 EF improved 40% - NYHA II - Volume stable on exam.  - Continue Entresto 97/103 mg BID - Increase Coreg to 25 mg BID - Continue Spiro 25 mg daily - Intolerant to Bidil in the past with dizziness and headaches.  - May be able to reduce lasix to 20 mg once a day with increased Entresto dosing.   2. CAD: s/p DES to proximal LAD in 09/2015  - Denies chest pain - Continue ASA, Plavix and atorvastatin.   3. HTN - BP elevated today, will increase Coreg as above.  - Add Norvasc 5 mg daily.   4. 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  - Continue ASA and Plavix.   4. DMII - Follows with Internal Medicine Clinic.   5. OSA - CPAP was sent to wrong address, our RN will facilitate getting it to his correct address.      6. Tobacco use - Encouraged cessation.   BMET today. Follow up in 4 months.   Arbutus Leas, NP  2:41 PM   Stable from HF perspective. BP up. Will increase carvedilol and add amlodipine.Will help arrange him getting his CPAP. Continue ASA/Plavix.   Glori Bickers, MD  9:47 PM

## 2017-03-16 ENCOUNTER — Telehealth (HOSPITAL_COMMUNITY): Payer: Self-pay | Admitting: *Deleted

## 2017-03-16 NOTE — Telephone Encounter (Signed)
-----   Message from [place] sent at 03/15/2017  3:13 PM EDT ----- We have left messages for this pt with the last one on 8/13.  This was requested by Dr. Mayford Knife who gave [place] a new contact number on 8/13.  The pt needs to contact our office at [phone]  Ex [phone] so he can be scheduled for an appointment to be set up.  We have tried to contact this pt 5 times.  Thank you!   ----- Message ----- From: Georgina Peer, RN Sent: 03/15/2017   2:52 PM To: Sheron Nightingale, [place] Newcomer  This patient was ordered a CPAP in May by Carolanne Grumbling.  Patient stated he had moved in the process and he never got the equipment. Can you tell me if this was ordered through [facility]? If so can we have it sent out to patient new address?

## 2017-03-16 NOTE — Telephone Encounter (Signed)
Called patient today but had to leave VM asking for him to call us back.

## 2017-04-04 IMAGING — CR DG CHEST 1V PORT
1 series · 1 of 1 positions shown · non-contrast
Comparison: None.

CLINICAL DATA: Post CPR.  Code STEMI.

EXAM:
PORTABLE CHEST 1 VIEW

[AP]
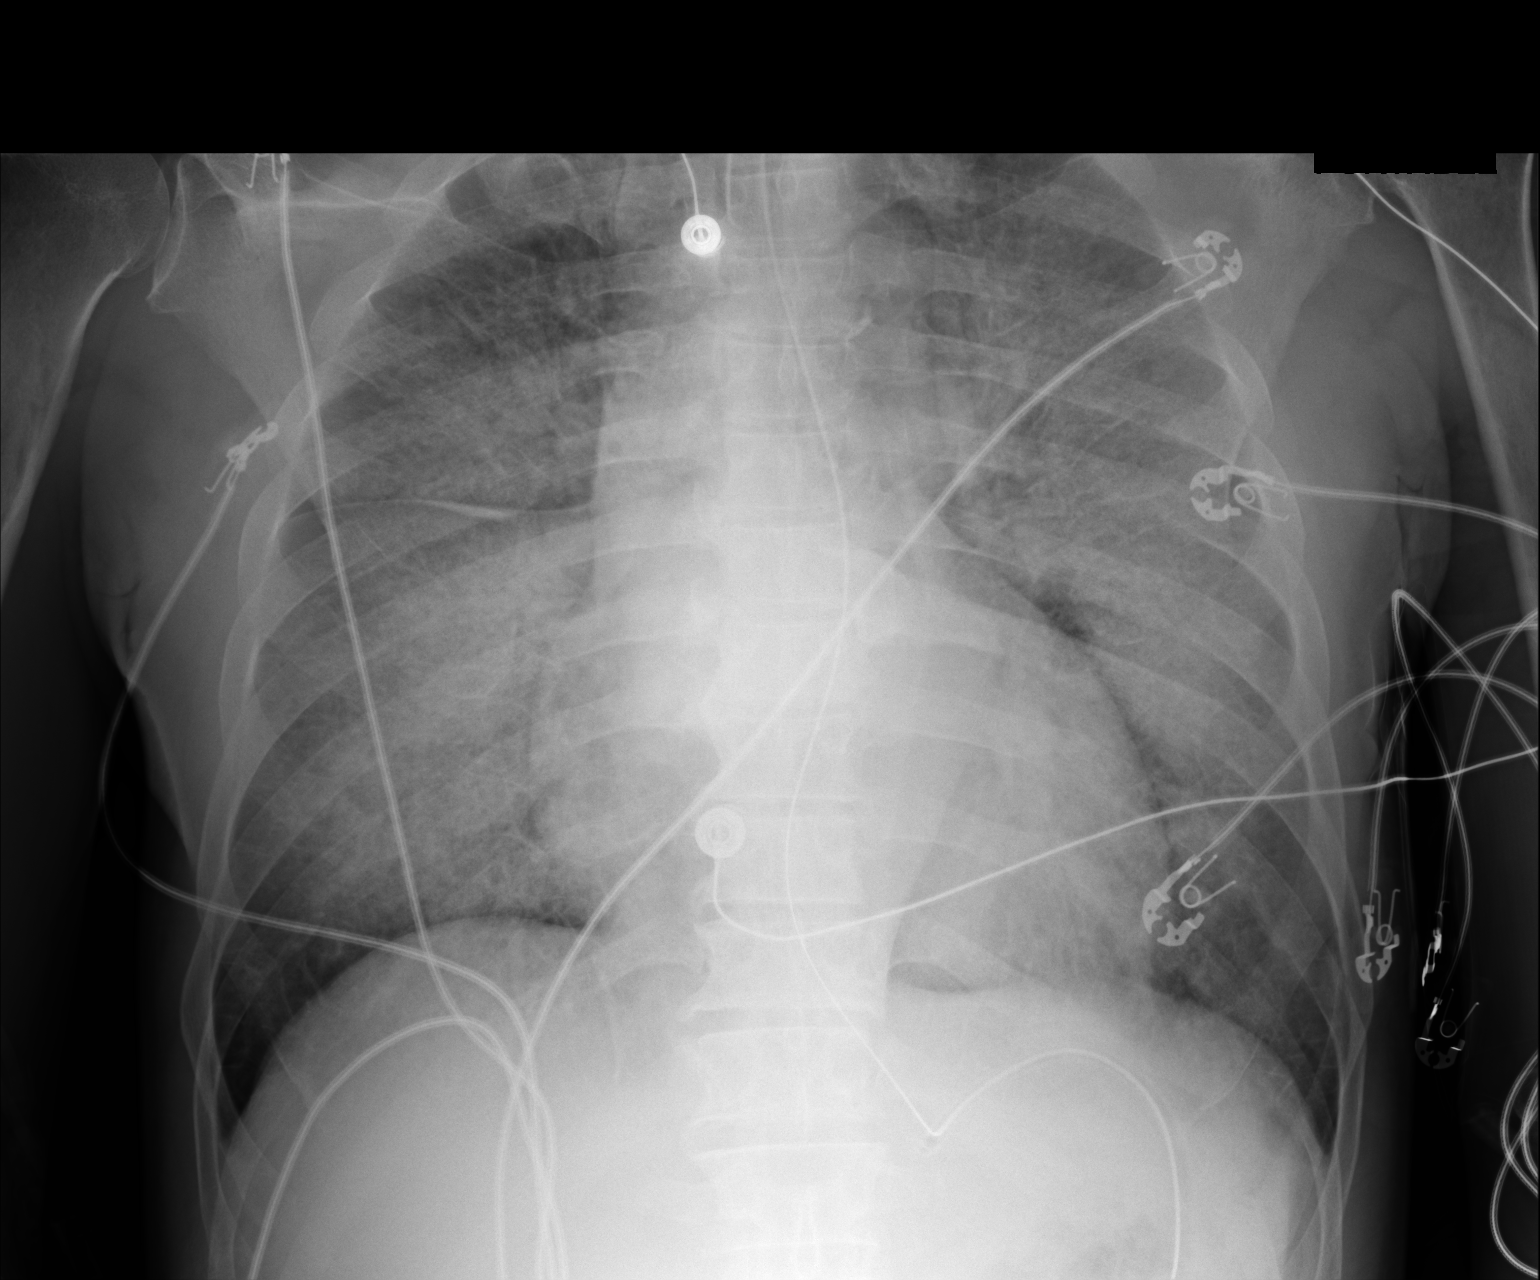

[1 of 1 positions shown; findings below may reference images not displayed]

FINDINGS: Endotracheal tube with tip measuring 4.2 cm above the carina.
Enteric tube tip is off the field of view but below the left
hemidiaphragm. Borderline heart size with normal pulmonary
vascularity. Bilateral diffuse parenchymal infiltrates centered in
the perihilar regions. This could represent fluid overload,
pneumonia, or aspiration. Visualized bones appear intact.
IMPRESSION: Appliances appear in satisfactory location. Diffuse bilateral
parenchymal infiltrates could indicate edema, pneumonia, or
aspiration.

## 2017-04-04 IMAGING — DX DG CHEST 1V PORT
1 series · 1 of 1 positions shown · non-contrast
Comparison: 09/28/2015

CLINICAL DATA: Central line placement. Endotracheal tube. Post
arrest.

EXAM:
PORTABLE CHEST 1 VIEW

[chest ap]
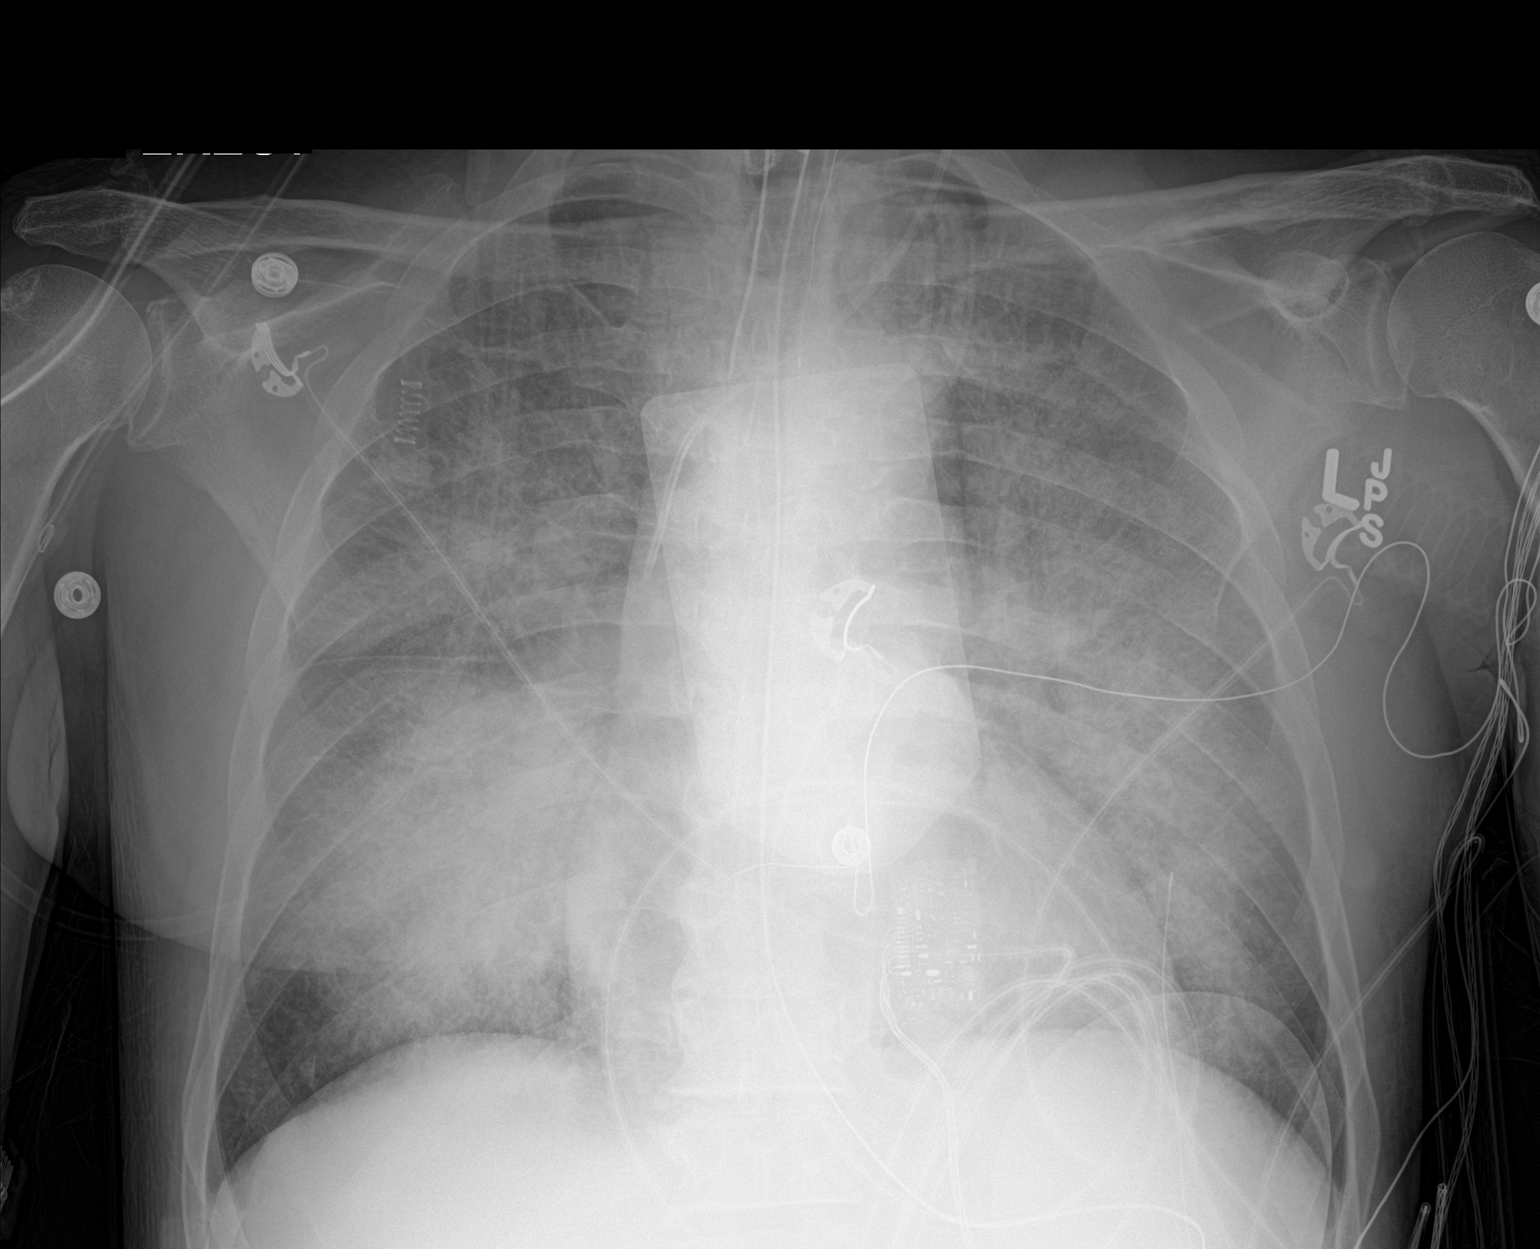

[1 of 1 positions shown; findings below may reference images not displayed]

FINDINGS: Endotracheal tube with tip measuring 5.7 cm above the carina.
Enteric tube tip is off the field of view but below the left
hemidiaphragm. New left central venous catheter with tip over the
low SVC region. No pneumothorax. Normal heart size. Persistent
diffuse bilateral parenchymal infiltrates in the lungs without
significant change since previous study. No blunting of costophrenic
angles.
IMPRESSION: Appliances appear in satisfactory position. No pneumothorax.
Persistent diffuse bilateral pulmonary infiltrates.

## 2017-04-10 MED FILL — CLOPIDOGREL 75 MG TABLET: 75 | 30 days supply | Qty: 30 | Fill #3

## 2017-04-10 MED FILL — SPIRONOLACTONE 25 MG TABLET: 25 | 30 days supply | Qty: 30 | Fill #5

## 2017-04-10 MED FILL — FUROSEMIDE 20 MG TABLET: 20 | 30 days supply | Qty: 60 | Fill #3

## 2017-04-10 MED FILL — AMLODIPINE BESYLATE 5 MG TA: 5 | 30 days supply | Qty: 30 | Fill #1

## 2017-04-11 IMAGING — CR DG CHEST 1V PORT
1 series · 1 of 1 positions shown · non-contrast
Comparison: Portable exam 4677 hours compared to 10/03/2015

CLINICAL DATA: Respiratory failure, smoker

EXAM:
PORTABLE CHEST 1 VIEW

[AP]
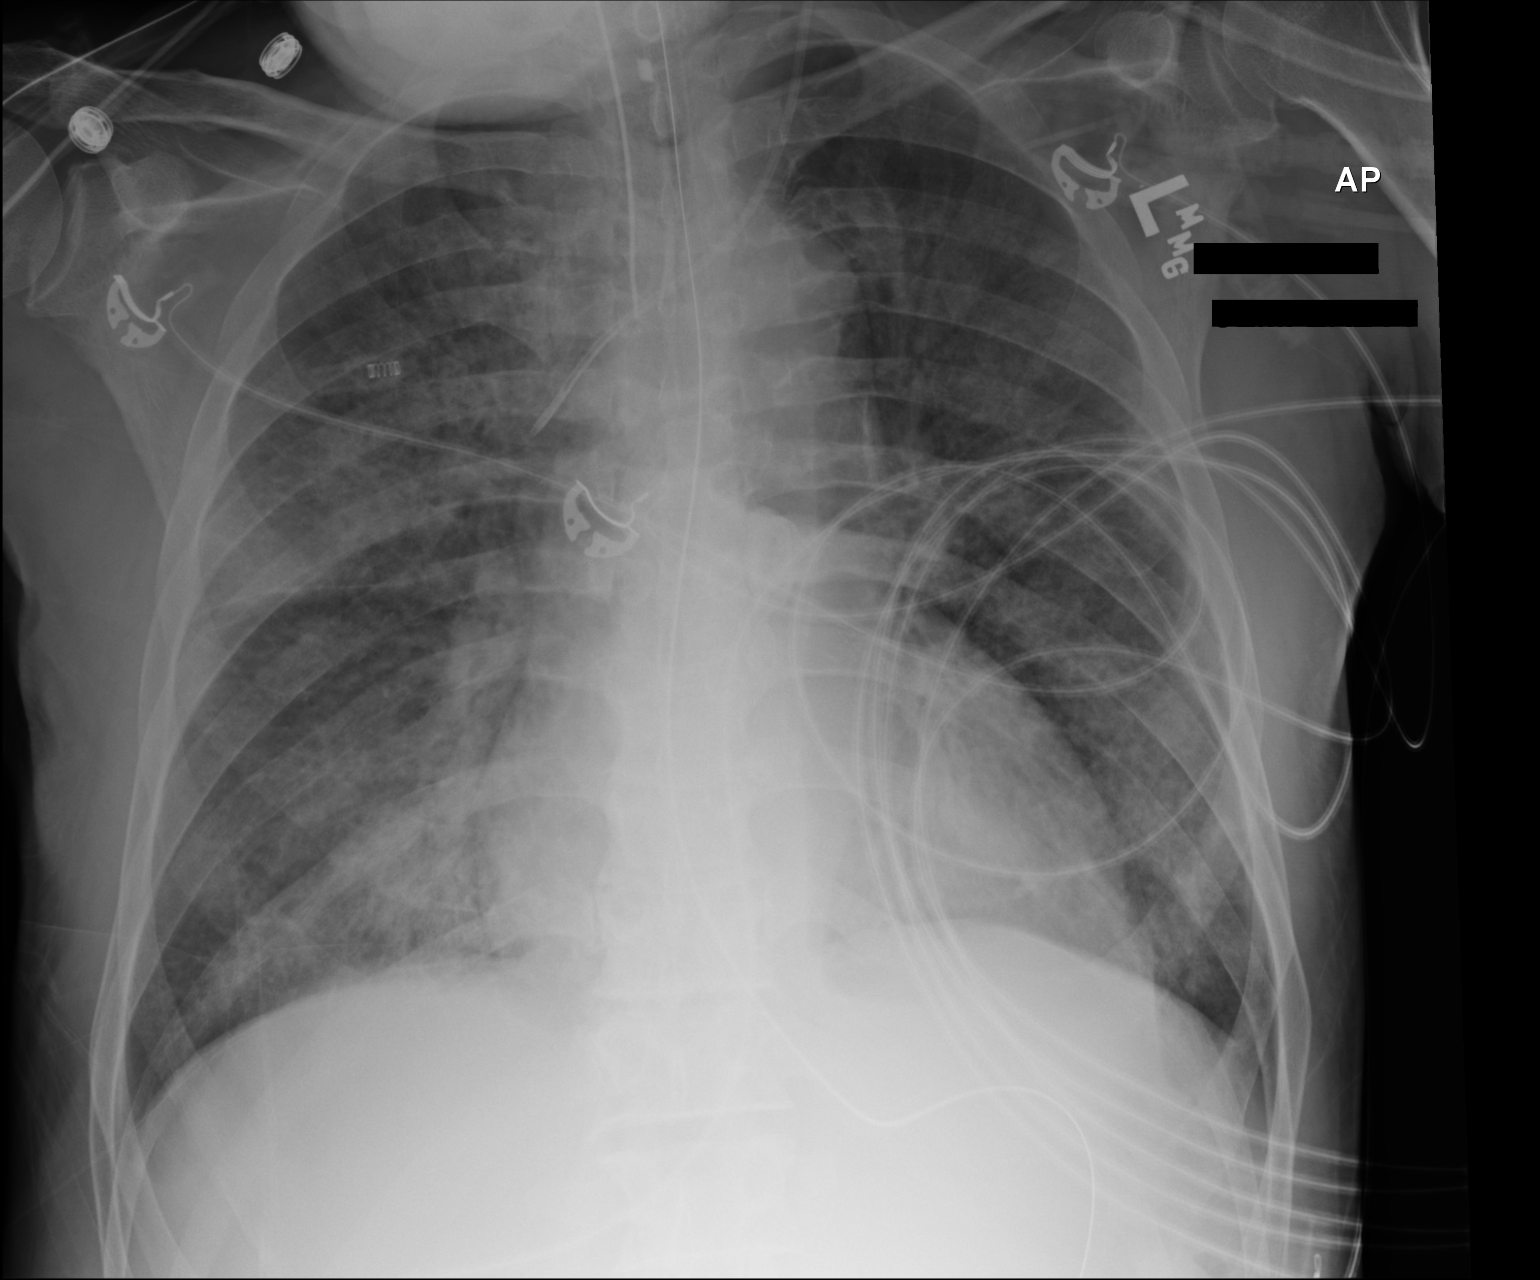

[1 of 1 positions shown; findings below may reference images not displayed]

FINDINGS: Tip of endotracheal tube projects 3.7 cm above carina.

Nasogastric tube extends into stomach.

LEFT jugular central venous catheter tip projects over SVC.

Enlargement of cardiac silhouette.

Diffuse BILATERAL pulmonary infiltrates increased since previous
exam likely pulmonary edema though infection is not excluded.

No pleural effusion or pneumothorax.

Bones unremarkable.
IMPRESSION: Satisfactory line and tube positions.

Diffuse BILATERAL pulmonary infiltrates significantly increased
since previous exam question pulmonary edema though multifocal
pneumonia not excluded.

## 2017-04-11 MED FILL — CARVEDILOL 12.5 MG TABLET: 12.5 | 30 days supply | Qty: 90 | Fill #4

## 2017-04-11 NOTE — Progress Notes (Signed)
[facility] Subjective:  HPI: [name] is a 66 y.o. male who presents for left shoulder pain and DM follow up.  He reports he has been having left shoulder pain for a few weeks, no trauma, reports worse with certain movements, worse at night- harder to get comfortable.  Please see Assessment and Plan below for the status of his chronic medical problems.  Review of Systems: No fever, chills, chest pain, SOB. Objective:  Physical Exam: Vitals:   04/12/17 0908  BP: 137/75  Pulse: 96  Temp: 98.4 F (36.9 C)  TempSrc: Oral  SpO2: 100%  Weight: 151 lb 3.2 oz (68.6 kg)  Height:  (1.676 m)  Physical Exam  Constitutional: He is well-developed, well-nourished, and in no distress.  Eyes: Conjunctivae are normal.  Cardiovascular: Normal rate and regular rhythm.   Pulmonary/Chest: Effort normal and breath sounds normal.  Musculoskeletal:  Positive neers and hawkins tests on the left side, has active ROM in abduction to about 170 deg bilaterally, limited on left side back scratch.  Nursing note and vitals reviewed.   Assessment & Plan:  Essential hypertension HPI: [name] has been adjusting his BP medications, at his last visit there last month he was still hypertensive amlodipine was added and coreg increased, he reports no symptoms and feels he is doing well.  A: Essential HTN  P Continue Amlodipine , Entresto 97-103, spironolactone  daily, coreg  BID  Uncontrolled type 2 diabetes mellitus with diabetic neuropathy, with long-term current use of insulin (HCC) HPI: since our last visit he has been working with our diabetic educator donna, doing much better.  Asymptoamtic, Glucometer downloaded and shows AM sugars between 100-180. A1c decreased last month to 7.5%.  A: Type 2 DM, improved glycemic control  P:Continue metformin 1g BID, Lantus 10units daily.  Shoulder impingement syndrome, left A: Left shoulder impingment  syndrome  P: Discussed benefits and risks of steroid injection.  I think his DM is reasonably controlled for this. He was agreeable to proceed.  Corticosteroid/idocaine injection completed, pain with immediate relief.  PROCEDURE NOTE  PROCEDURE: left shoulder joint steroid injection.  PREOPERATIVE DIAGNOSIS: Left shoulder impingement syndrome  POSTOPERATIVE DIAGNOSIS: same  PROCEDURE: The patient was apprised of the risks and the benefits of the procedure and informed consent was obtained. Time-out procedure was performed, with confirmation of the patient's name, date of birth, and correct identification of the left shoulder to be injected. The patient's shoulder was then marked at the appropriate site for injection placement. The shoulder was sterilely prepped with Betadine. A 30 mg (0.75 milliliter) solution of Kenalog was drawn up into a 3 mL syringe with a 2 mL of 1% lidocaine. The patient was injected with a 25 gauge needle at the lateral  aspect of his  left shoulder. There were no complications. The patient tolerated the procedure well. There was minimal bleeding. The patient was instructed to ice her shoulder upon leaving clinic and refrain from overuse over the next 3 days. The patient was instructed to go to the emergency room with any usual pain, swelling, or redness occurred in the injected area. The patient was given a followup appointment to evaluate response to the injection to his increased range of motion and reduction of pain.  Medications Ordered No orders of the defined types were placed in this encounter.  Other Orders Orders Placed This Encounter  Procedures  . Flu Vaccine QUAD 36+ mos IM  . Glucose, capillary   Follow  Up: No Follow-up on file.

## 2017-04-12 ENCOUNTER — Encounter: Payer: Self-pay | Admitting: Internal Medicine

## 2017-04-12 ENCOUNTER — Ambulatory Visit (INDEPENDENT_AMBULATORY_CARE_PROVIDER_SITE_OTHER): Payer: Medicare HMO | Admitting: Internal Medicine

## 2017-04-12 VITALS — BP 137/75 | HR 96 | Temp 98.4°F | Ht 66.0 in | Wt 151.2 lb

## 2017-04-12 DIAGNOSIS — F17210 Nicotine dependence, cigarettes, uncomplicated: Secondary | ICD-10-CM | POA: Diagnosis not present

## 2017-04-12 DIAGNOSIS — Z79899 Other long term (current) drug therapy: Secondary | ICD-10-CM | POA: Diagnosis not present

## 2017-04-12 DIAGNOSIS — Z794 Long term (current) use of insulin: Secondary | ICD-10-CM | POA: Diagnosis not present

## 2017-04-12 DIAGNOSIS — I10 Essential (primary) hypertension: Secondary | ICD-10-CM

## 2017-04-12 DIAGNOSIS — Z23 Encounter for immunization: Secondary | ICD-10-CM

## 2017-04-12 DIAGNOSIS — E1165 Type 2 diabetes mellitus with hyperglycemia: Secondary | ICD-10-CM

## 2017-04-12 DIAGNOSIS — M7542 Impingement syndrome of left shoulder: Secondary | ICD-10-CM | POA: Diagnosis not present

## 2017-04-12 DIAGNOSIS — IMO0002 Reserved for concepts with insufficient information to code with codable children: Secondary | ICD-10-CM

## 2017-04-12 DIAGNOSIS — E1140 Type 2 diabetes mellitus with diabetic neuropathy, unspecified: Secondary | ICD-10-CM

## 2017-04-12 LAB — GLUCOSE, CAPILLARY: Glucose-Capillary: 184 mg/dL — ABNORMAL HIGH (ref 65–99)

## 2017-04-13 DIAGNOSIS — M7542 Impingement syndrome of left shoulder: Secondary | ICD-10-CM | POA: Insufficient documentation

## 2017-04-13 NOTE — Assessment & Plan Note (Signed)
A: Left shoulder impingment syndrome  P: Discussed benefits and risks of steroid injection.  I think his DM is reasonably controlled for this. He was agreeable to proceed.  Corticosteroid/idocaine injection completed, pain with immediate relief.

## 2017-04-13 NOTE — Assessment & Plan Note (Signed)
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  A: Essential HTN  P Continue Amlodipine 5mg , Entresto 97-103, spironolactone 25mg  daily, coreg 25mg  BID

## 2017-04-13 NOTE — Assessment & Plan Note (Signed)
HPI: since our last visit he has been working with our diabetic educator donna, doing much better.  Asymptoamtic, Glucometer downloaded and shows AM sugars between 100-180. A1c decreased last month to 7.5%.  A: Type 2 DM, improved glycemic control  P:Continue metformin 1g BID, Lantus 10units daily.

## 2017-04-16 ENCOUNTER — Other Ambulatory Visit (HOSPITAL_COMMUNITY): Payer: Self-pay | Admitting: Cardiology

## 2017-04-16 DIAGNOSIS — I2102 ST elevation (STEMI) myocardial infarction involving left anterior descending coronary artery: Secondary | ICD-10-CM

## 2017-04-17 ENCOUNTER — Other Ambulatory Visit (HOSPITAL_COMMUNITY): Payer: Self-pay | Admitting: Cardiology

## 2017-04-17 MED ORDER — ASPIRIN EC 81 MG PO TBEC: 81.0000 mg | DELAYED_RELEASE_TABLET | Freq: Every day | ORAL | 3 refills | Status: AC

## 2017-04-18 MED FILL — ATORVASTATIN 80 MG TABLET: 80 | 30 days supply | Qty: 30 | Fill #0

## 2017-04-20 NOTE — Telephone Encounter (Signed)
[facility] has not been able to contact the patient for CPAP set up. CPAP assistant reached out to patient and left a message on [phone] to call [facility] at [phone] [phone]  04/20/17.

## 2017-04-24 MED FILL — ENTRESTO 97 MG-103 MG TAB: 97-103 | 30 days supply | Qty: 60 | Fill #3

## 2017-04-24 MED FILL — metFORMIN HCL 1000 MG TABS: 1000 | 60 days supply | Qty: 120 | Fill #3

## 2017-04-26 DIAGNOSIS — J9601 Acute respiratory failure with hypoxia: Secondary | ICD-10-CM | POA: Diagnosis not present

## 2017-04-26 DIAGNOSIS — G4733 Obstructive sleep apnea (adult) (pediatric): Secondary | ICD-10-CM | POA: Diagnosis not present

## 2017-04-27 ENCOUNTER — Other Ambulatory Visit: Payer: Self-pay | Admitting: *Deleted

## 2017-04-27 DIAGNOSIS — E118 Type 2 diabetes mellitus with unspecified complications: Secondary | ICD-10-CM

## 2017-04-27 DIAGNOSIS — Z794 Long term (current) use of insulin: Principal | ICD-10-CM

## 2017-04-27 DIAGNOSIS — E1140 Type 2 diabetes mellitus with diabetic neuropathy, unspecified: Secondary | ICD-10-CM

## 2017-04-30 MED ORDER — INSULIN GLARGINE 100 UNIT/ML SOLOSTAR PEN: 10.0000 [IU] | PEN_INJECTOR | Freq: Every day | SUBCUTANEOUS | 1 refills | Status: DC

## 2017-05-02 ENCOUNTER — Ambulatory Visit (INDEPENDENT_AMBULATORY_CARE_PROVIDER_SITE_OTHER): Payer: Medicare HMO | Admitting: Dietician

## 2017-05-02 DIAGNOSIS — E1140 Type 2 diabetes mellitus with diabetic neuropathy, unspecified: Secondary | ICD-10-CM

## 2017-05-02 DIAGNOSIS — Z713 Dietary counseling and surveillance: Secondary | ICD-10-CM

## 2017-05-02 DIAGNOSIS — E1165 Type 2 diabetes mellitus with hyperglycemia: Secondary | ICD-10-CM | POA: Diagnosis not present

## 2017-05-02 DIAGNOSIS — IMO0002 Reserved for concepts with insufficient information to code with codable children: Secondary | ICD-10-CM

## 2017-05-02 DIAGNOSIS — Z794 Long term (current) use of insulin: Secondary | ICD-10-CM | POA: Diagnosis not present

## 2017-05-02 NOTE — Progress Notes (Signed)
  Medical Nutrition Therapy:  Appt start time: 0930 end time:  1030. Visit # 3  Assessment:  Primary concerns today: diabetes education.   Mr. Clyatt is here with his wife today. He has lost 13# in the past month from improvements in his diet, more vegetables, lean protein. He has an eye doctor appointment this Friday and is working on a Education officer, community. He has been out of lantus for the past four days or so. They cannot afford the copay for a few more days. Blood sugars have been trending higher. Wife brings him sweet tea from her work and he is still drinking koolaid which they say spiked his sugar up to 386  ANTHROPOMETRICS: weight-147.21#, height-66", BMI-23.76 continues in the healthy range  MEDICATIONS: lantus 10 units, metformin BLOOD SUGAR:139 fasting  today, Meter download shows average of 159 (20 points higher) , 19 readings in a month, 35% (compared to 53% last visit) in target.  DIETARY INTAKE: Usual eating pattern includes 3 meals and 0-1 snacks per day. Dining Out (times/week): 7x/week.   24-hr recall:  B ( AM): cereal, juice, coffee L ( PM):tossed salad with light dressing, chicken or fish Kooloaid D ( PM): Chicken patty, mashed potatoes, green beans, tea Beverages: coffee with sugar~ 1 tsp and cream, Mc[employer] sweet tea, homemade sweet tea, kooliad, juice  Usual physical activity: walks daily for over 30 minutes  Progress Towards Goal(s):  In progress.   Nutritional Diagnosis:  NB-1.1 Food and nutrition-related knowledge deficit As related to lack of prior diabetes training improving  As evidenced by theri questions and report.    Intervention:  Nutrition education about general diabetes and diabetes meal planning encouraged more high nutrient foods (fruits, vegetables and whole grains) for overall health. Educated about both acute and chronic complications of diabetes Coordination of care: patient provided with sample insulin pen today Teaching Method Utilized: Visual, Auditory, Hands  on Handouts given during visit include:Living with Type 2 diabetes, A1C sheet Barriers to learning/adherence to lifestyle change: material resources Demonstrated degree of understanding via:  Teach Back   Monitoring/Evaluation:  Dietary intake, exercise, meter, and body weight in 8 week(s)  Hamlin Devine, [name], RD 05/02/2017 10:50 AM. .

## 2017-05-02 NOTE — Addendum Note (Signed)
Addended by: Carlynn Purl C on: 05/02/2017 01:51 PM   Modules accepted: Orders

## 2017-05-02 NOTE — Addendum Note (Signed)
Addended by: Neomia Dear on: 05/02/2017 07:14 PM   Modules accepted: Orders

## 2017-05-02 NOTE — Patient Instructions (Addendum)
.  Good job taking care of your diabetes.  One thing you want to work on for the next few months: less added sugar  Juice instead of Kooliad 1/2 and 1/2 sweet tea Fruit is a good dessert.  Frozen bananas, applesauce, peaches, pineapple  Dentist- maybe you can get the names from that lady or ask about our dental clinic  See you in December.

## 2017-05-04 DIAGNOSIS — H401133 Primary open-angle glaucoma, bilateral, severe stage: Secondary | ICD-10-CM | POA: Diagnosis not present

## 2017-05-14 ENCOUNTER — Telehealth: Payer: Self-pay | Admitting: *Deleted

## 2017-05-14 NOTE — Telephone Encounter (Signed)
Called patient to schedule his 10 week sleep follow up appointment LMTCB.

## 2017-05-21 MED FILL — LATANOPROST 0.005% EYE DRP: 0.005 | 25 days supply | Qty: 3 | Fill #0

## 2017-05-21 MED FILL — CARVEDILOL 12.5 MG TABLET: 12.5 | 30 days supply | Qty: 90 | Fill #5

## 2017-05-21 MED FILL — ATORVASTATIN 80 MG TABLET: 80 | 30 days supply | Qty: 30 | Fill #1

## 2017-05-21 MED FILL — LANTUS SOLOSTAR 100 UNITS/M: 100 | 84 days supply | Qty: 9 | Fill #0

## 2017-05-21 MED FILL — DORZOLAMIDE-TIMOLOL EYE DRP: 22.3-6.8 | 50 days supply | Qty: 10 | Fill #0

## 2017-05-22 MED FILL — CLOPIDOGREL 75 MG TABLET: 75 | 30 days supply | Qty: 30 | Fill #4

## 2017-05-22 MED FILL — SPIRONOLACTONE 25 MG TABLET: 25 | 30 days supply | Qty: 15 | Fill #5

## 2017-05-22 MED FILL — FUROSEMIDE 20 MG TABLET: 20 | 30 days supply | Qty: 60 | Fill #4

## 2017-05-22 MED FILL — AMLODIPINE BESYLATE 5 MG TA: 5 | 30 days supply | Qty: 30 | Fill #2

## 2017-05-24 ENCOUNTER — Other Ambulatory Visit (HOSPITAL_COMMUNITY): Payer: Self-pay | Admitting: Internal Medicine

## 2017-05-24 MED FILL — UNIFINE PENTIPS 31GX3/16": 31G X 5 MM | 90 days supply | Qty: 100 | Fill #1

## 2017-05-24 MED FILL — UNIFINE PENTIPS 31GX3/16: 31G X 5 MM | 90 days supply | Qty: 100 | Fill #1

## 2017-05-25 ENCOUNTER — Other Ambulatory Visit (HOSPITAL_COMMUNITY): Payer: Self-pay | Admitting: *Deleted

## 2017-05-25 MED ORDER — SACUBITRIL-VALSARTAN 97-103 MG PO TABS: 1.0000 | ORAL_TABLET | Freq: Two times a day (BID) | ORAL | 3 refills | Status: DC

## 2017-05-25 MED FILL — ENTRESTO 97 MG-103 MG TAB: 97-103 | 30 days supply | Qty: 60 | Fill #0

## 2017-05-27 DIAGNOSIS — G4733 Obstructive sleep apnea (adult) (pediatric): Secondary | ICD-10-CM | POA: Diagnosis not present

## 2017-05-27 DIAGNOSIS — J9601 Acute respiratory failure with hypoxia: Secondary | ICD-10-CM | POA: Diagnosis not present

## 2017-06-01 ENCOUNTER — Encounter: Payer: Self-pay | Admitting: Cardiology

## 2017-06-05 NOTE — Telephone Encounter (Signed)
-----   Message from Quintella Reichert, MD sent at 06/04/2017  2:58 PM EST ----- Good AHI on CPAP but needs to improve compliance

## 2017-06-05 NOTE — Telephone Encounter (Signed)
Informed patient of compliance results and verbalized understanding was indicated. Patient understands his apnea events are in normal range at 1.1. Patient understands he needs to improve on his compliance. Patient thanked me for calling.

## 2017-06-17 IMAGING — CT CT HEAD W/O CM
3 of 4 series · 16 of 47 positions shown, 19 images · non-contrast
Comparison: None.

CLINICAL DATA: Decreased mental status. History of stroke in [REDACTED].
Concern for sepsis due to bladder infection.

EXAM:
CT HEAD WITHOUT CONTRAST
TECHNIQUE: Contiguous axial images were obtained from the base of the skull
through the vertex without intravenous contrast.

[Series 201: head w/o, idose (1) · axial · non-contrast · 0.49mm/px · z∈[+116,+246]mm · 10 of 32 slices shown, 13 images]
[im 3/32  brain]
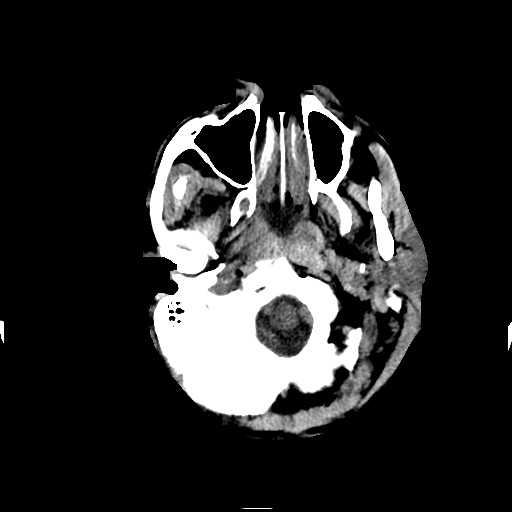
[im 3/32  bone]
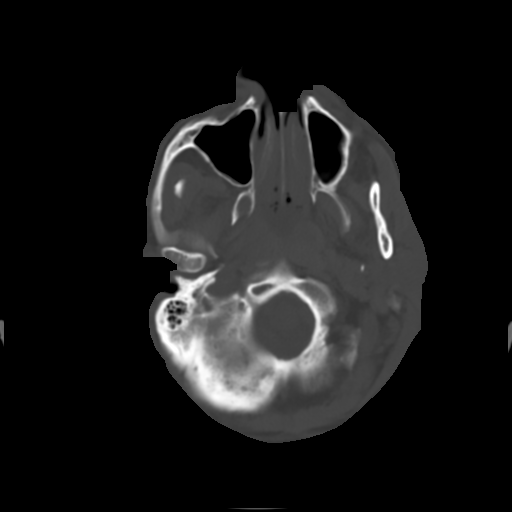
[im 5/32  brain]
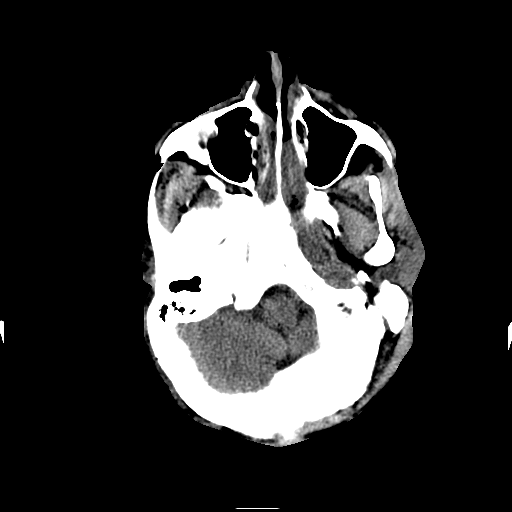
[im 9/32  brain]
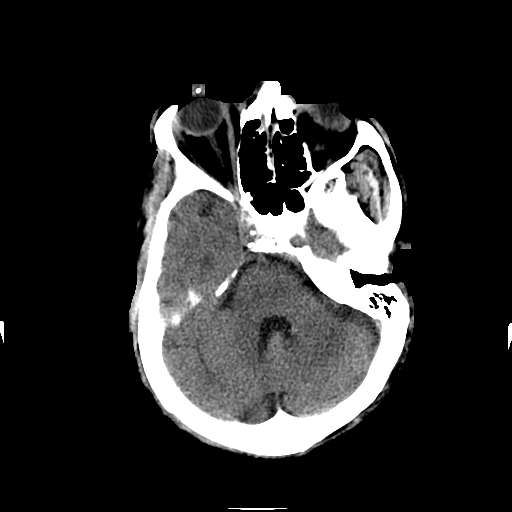
[im 12/32  brain]
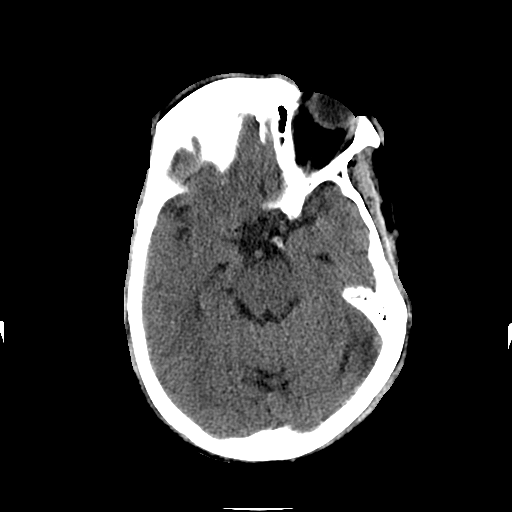
[im 14/32  brain]
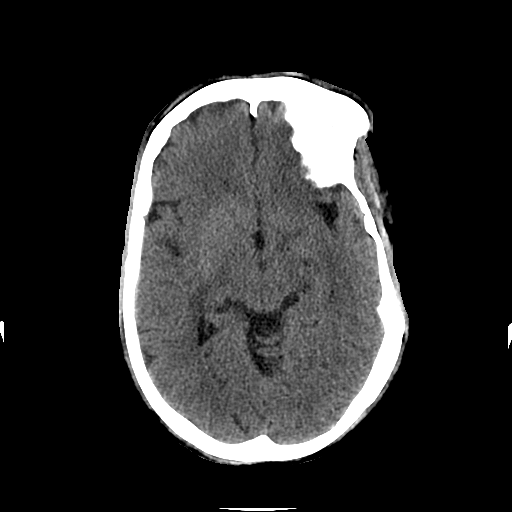
[im 14/32  bone]
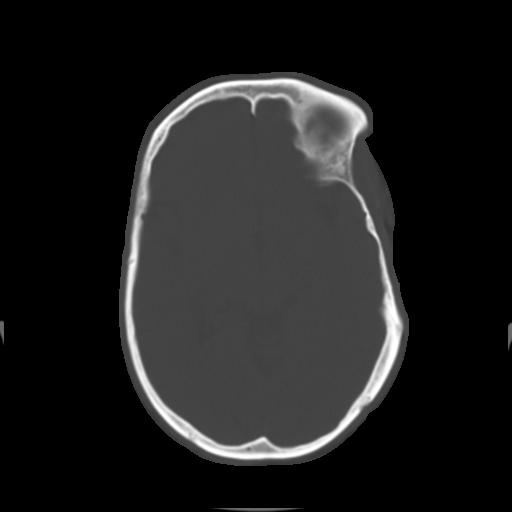
[im 18/32  brain]
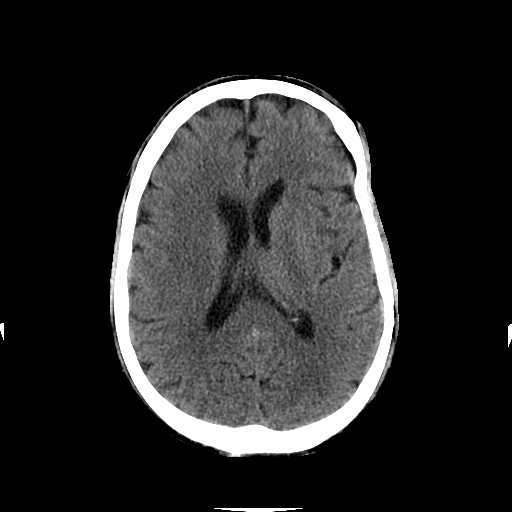
[im 20/32  brain]
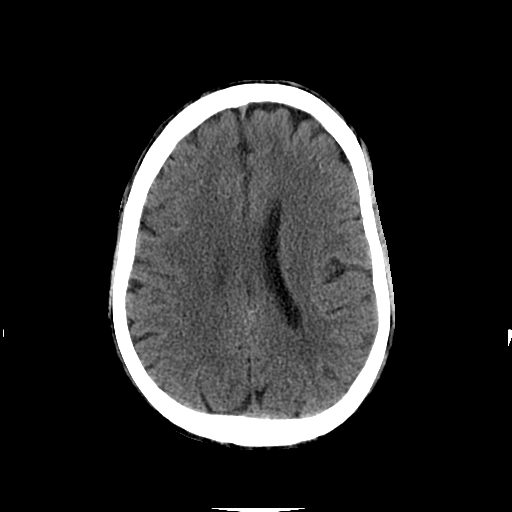
[im 23/32  brain]
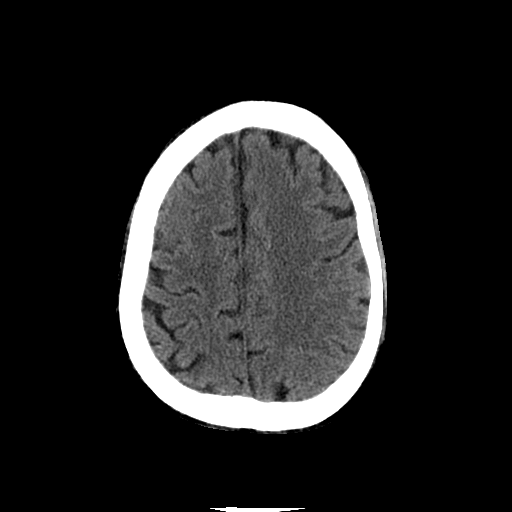
[im 27/32  brain]
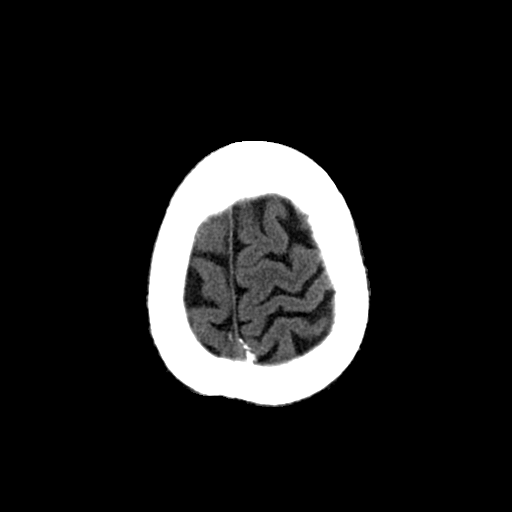
[im 27/32  bone]
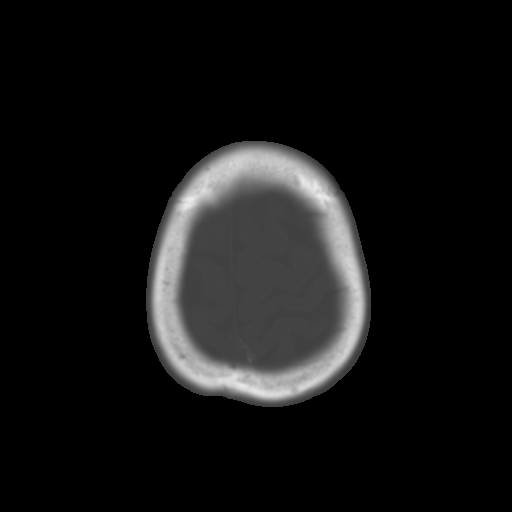
[im 29/32  brain]
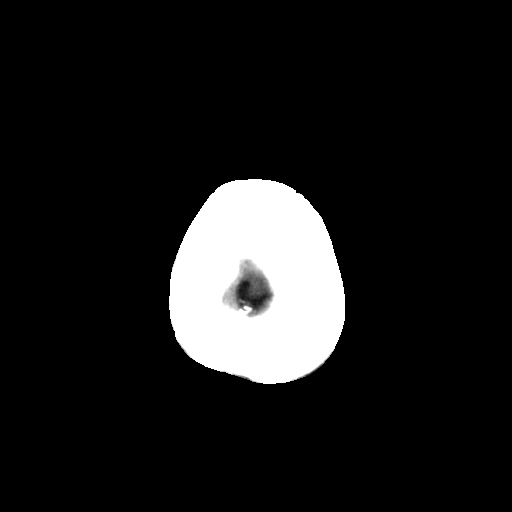

[Series 203: coronal st, idose (1) · coronal · 0.40mm/px · 3 of 79 slices shown]
[im 27/79  brain]
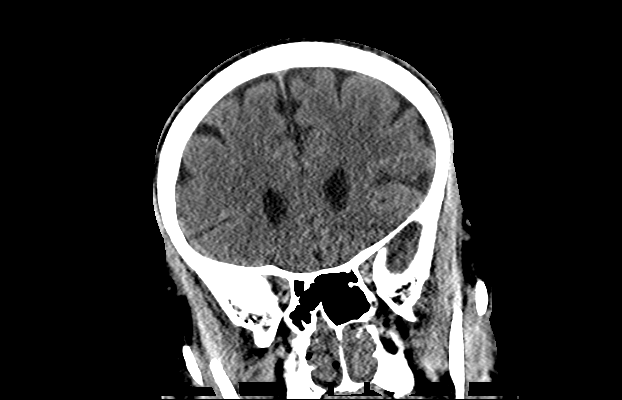
[im 35/79  brain]
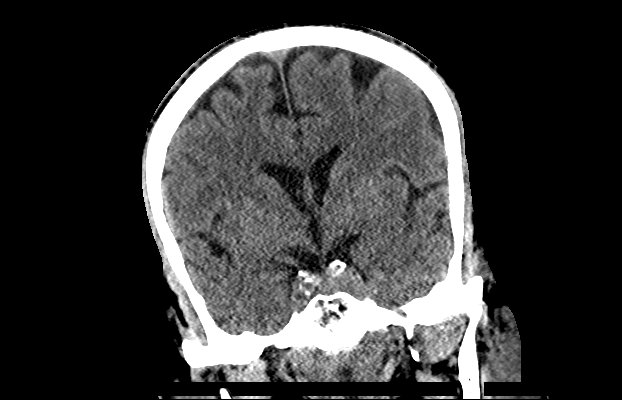
[im 44/79  brain]
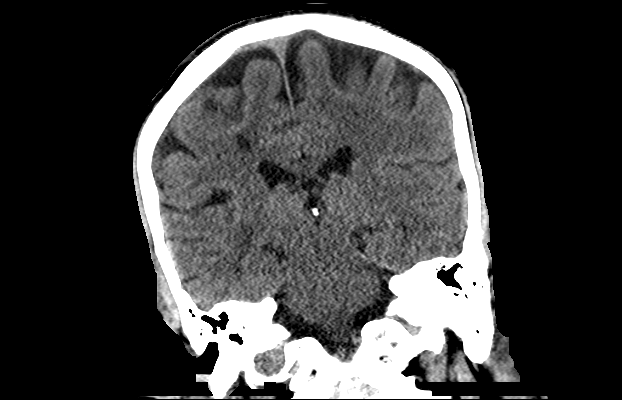

[Series 204: sagittal st, idose (1) · sagittal · 0.40mm/px · 3 of 83 slices shown]
[im 28/83  brain]
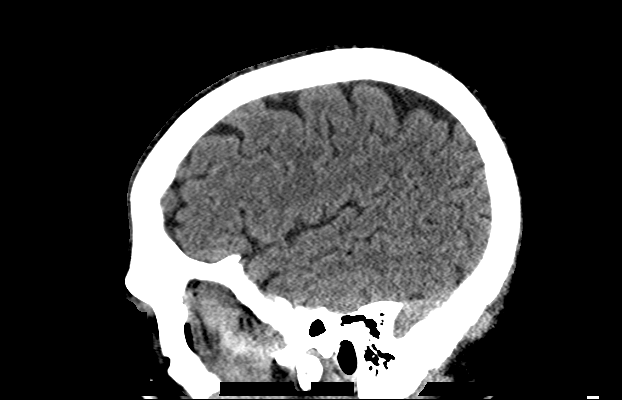
[im 42/83  brain]
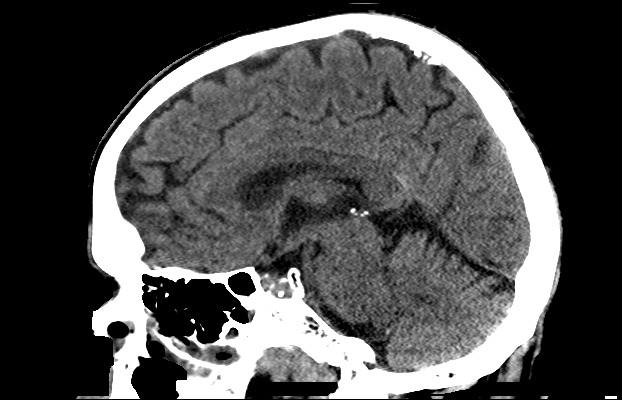
[im 55/83  brain]
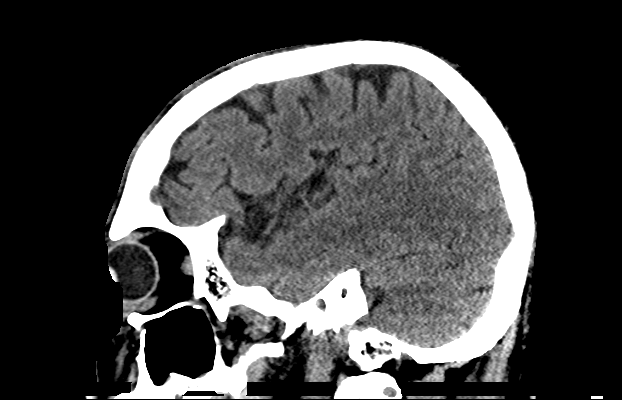

[16 of 47 positions shown; findings below may reference images not displayed]

FINDINGS: Brain: Geographic area of decreased attenuation involving the right
centrum semiovale (is 21, series 201), the sequela of microvascular
ischemic disease. Old infarct within the right basal ganglia (image
15, series 201). The gray-white differentiation is otherwise well
maintained without CT evidence of acute large territory infarct. No
intraparenchymal or extra-axial mass or hemorrhage. Normal size and
configuration of the ventricles and basilar cisterns. No midline
shift.

Vascular: Minimal intracranial atherosclerosis

Skull: Negative for fracture or focal lesion.

Sinuses/Orbits: Post right-sided cataract surgery. There is
underpneumatization the bilateral frontal sinuses. The remaining
paranasal sinuses and mastoid air cells are normally aerated. No
air-fluid levels.

Other: The patient is intubated. Regional soft tissues appear
normal.
IMPRESSION: Microvascular ischemic disease without acute intracranial process.

## 2017-06-18 ENCOUNTER — Other Ambulatory Visit (HOSPITAL_COMMUNITY): Payer: Self-pay | Admitting: Internal Medicine

## 2017-06-18 IMAGING — CR DG CHEST 1V PORT
1 series · 1 of 1 positions shown · non-contrast
Comparison: 12/11/2015.

CLINICAL DATA: Ventilator dependent respiratory failure. STEMI and
stroke in September 2015. Patient admitted yesterday for acute on
chronic shortness of breath and fever.

EXAM:
PORTABLE CHEST 1 VIEW

[AP]
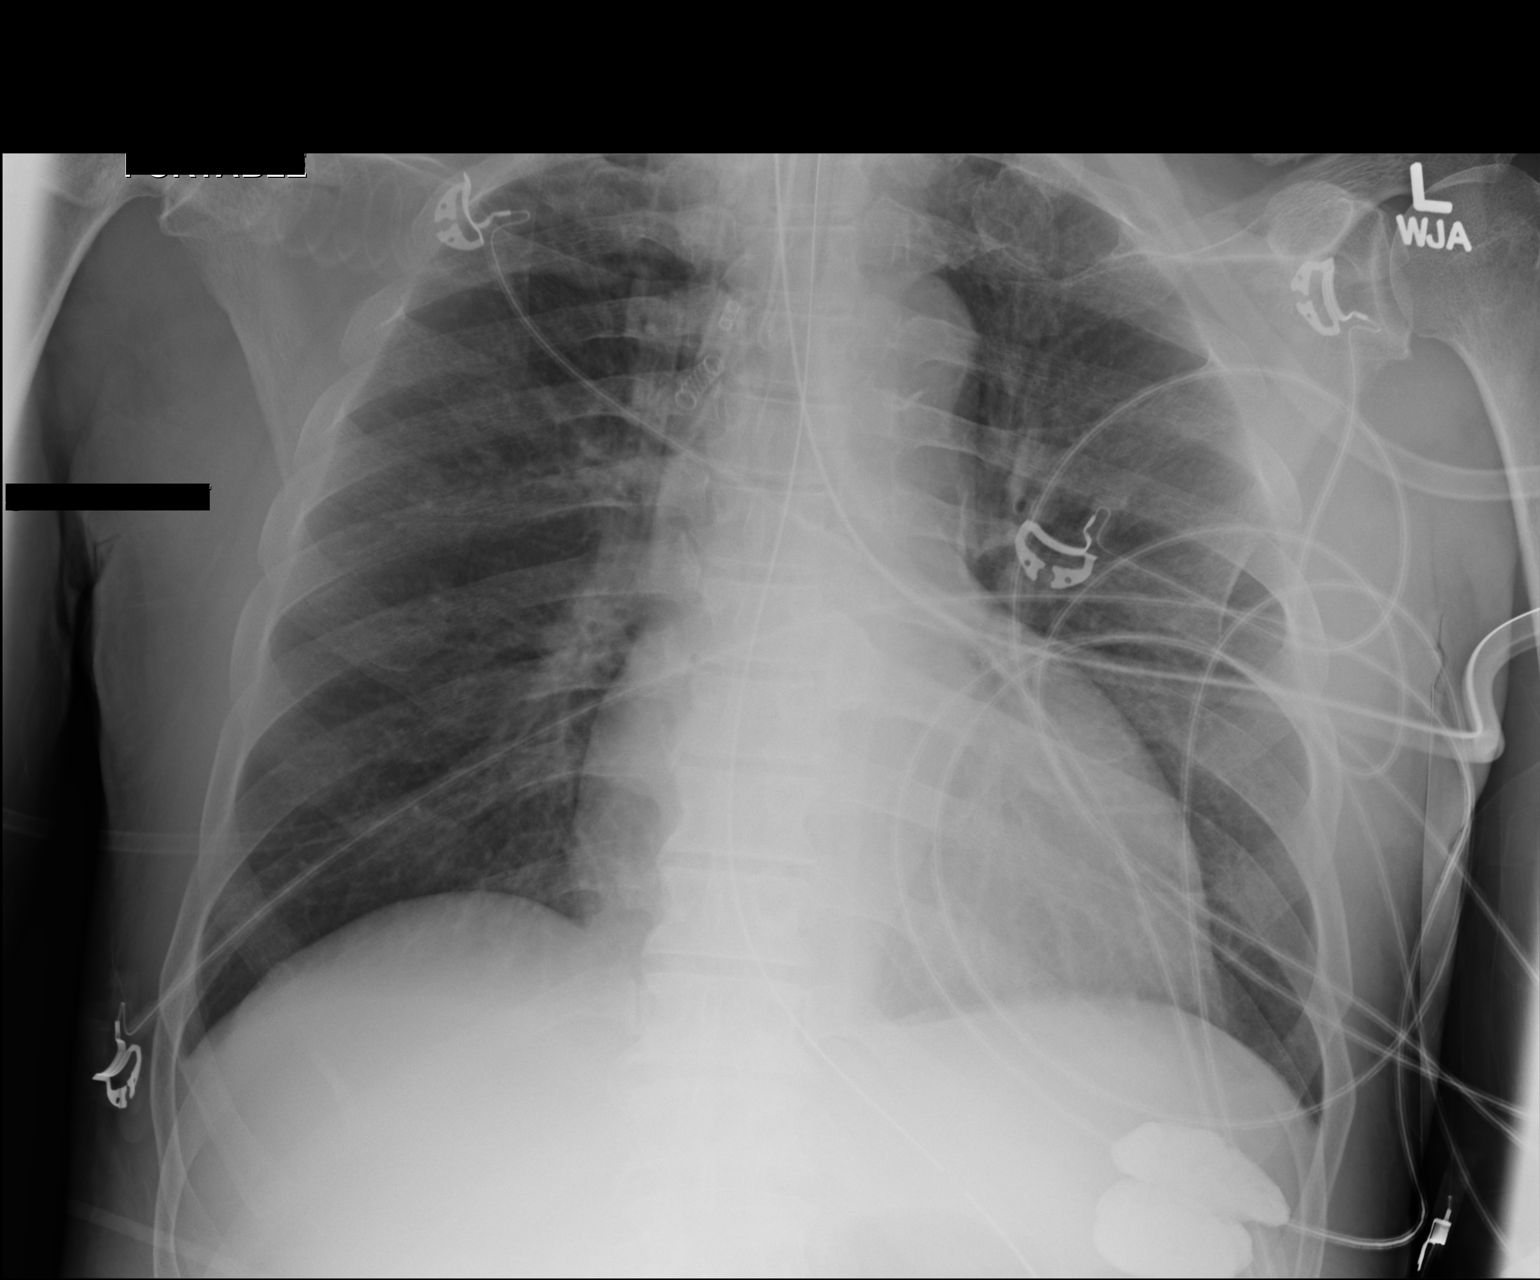

[1 of 1 positions shown; findings below may reference images not displayed]

FINDINGS: Endotracheal tube tip in satisfactory position projecting
approximately 5 cm above the carina. Nasogastric tube courses below
the diaphragm into the stomach. Cardiac silhouette upper normal in
size to slightly enlarged for AP portable technique. Lungs clear.
Bronchovascular markings normal. Pulmonary vascularity normal. No
visible pleural effusions. No pneumothorax. Barium from the
swallowing function study performed 5 days ago still present in the
visualized splenic flexure the colon.
IMPRESSION: 1. Support apparatus satisfactory.
2.  No acute cardiopulmonary disease.

## 2017-06-18 MED FILL — AMLODIPINE BESYLATE 5 MG TA: 5 | 30 days supply | Qty: 30 | Fill #3

## 2017-06-18 MED FILL — ATORVASTATIN 80 MG TABLET: 80 | 30 days supply | Qty: 30 | Fill #2

## 2017-06-18 MED FILL — CLOPIDOGREL 75 MG TABLET: 75 | 30 days supply | Qty: 30 | Fill #5

## 2017-06-18 MED FILL — metFORMIN HCL 1000 MG TABS: 1000 | 90 days supply | Qty: 180 | Fill #0

## 2017-06-18 MED FILL — FUROSEMIDE 20 MG TABLET: 20 | 30 days supply | Qty: 60 | Fill #5

## 2017-06-18 MED FILL — CARVEDILOL 12.5 MG TABLET: 12.5 | 90 days supply | Qty: 270 | Fill #0

## 2017-06-18 MED FILL — ENTRESTO 97 MG-103 MG TAB: 97-103 | 30 days supply | Qty: 60 | Fill #1

## 2017-06-19 MED FILL — SPIRONOLACTONE 25 MG TABLET: 25 | 30 days supply | Qty: 30 | Fill #0

## 2017-06-20 NOTE — Telephone Encounter (Signed)
Return call: Patient has a 10 week follow up appointment scheduled for  July 06 2017. Patient/wife understands he needs to keep this appointment for insurance compliance. Patient was grateful for the call and thanked me.    

## 2017-06-20 NOTE — Telephone Encounter (Signed)
Return call: Patient has a 10 week follow up appointment scheduled for  July 06 2017. Patient/wife understands he needs to keep this appointment for insurance compliance. Patient was grateful for the call and thanked me.

## 2017-06-22 MED FILL — TRUEplus LANCETS 30G MISC: 50 days supply | Qty: 100 | Fill #1

## 2017-06-22 MED FILL — TRUE METRIX GLUCOSE TEST ST: 50 days supply | Qty: 100 | Fill #1

## 2017-06-25 ENCOUNTER — Encounter: Payer: Self-pay | Admitting: Cardiology

## 2017-06-26 ENCOUNTER — Encounter: Payer: Self-pay | Admitting: Cardiology

## 2017-06-26 DIAGNOSIS — G4733 Obstructive sleep apnea (adult) (pediatric): Secondary | ICD-10-CM | POA: Diagnosis not present

## 2017-06-26 DIAGNOSIS — J9601 Acute respiratory failure with hypoxia: Secondary | ICD-10-CM | POA: Diagnosis not present

## 2017-07-05 ENCOUNTER — Telehealth: Payer: Self-pay | Admitting: *Deleted

## 2017-07-05 NOTE — Telephone Encounter (Signed)
Informed patient of compliance  results and verbalized understanding was indicated. Patient understands his apnea events are in normal range at 1.1 Patient understands his compliance is good. Patient understands his current settings will not change. Patient thanked me for the call.

## 2017-07-05 NOTE — Telephone Encounter (Signed)
-----   Message from Quintella Reichert, MD sent at 07/05/2017  2:57 PM EST ----- Good AHI and compliance.  Continue current CPAP settings.

## 2017-07-06 ENCOUNTER — Encounter (INDEPENDENT_AMBULATORY_CARE_PROVIDER_SITE_OTHER): Payer: Self-pay

## 2017-07-06 ENCOUNTER — Ambulatory Visit (INDEPENDENT_AMBULATORY_CARE_PROVIDER_SITE_OTHER): Payer: Medicare HMO | Admitting: Cardiology

## 2017-07-06 ENCOUNTER — Encounter: Payer: Self-pay | Admitting: Cardiology

## 2017-07-06 VITALS — BP 122/70 | HR 87 | Ht 66.0 in | Wt 152.8 lb

## 2017-07-06 DIAGNOSIS — G4733 Obstructive sleep apnea (adult) (pediatric): Secondary | ICD-10-CM | POA: Diagnosis not present

## 2017-07-06 DIAGNOSIS — H401133 Primary open-angle glaucoma, bilateral, severe stage: Secondary | ICD-10-CM | POA: Diagnosis not present

## 2017-07-06 DIAGNOSIS — I10 Essential (primary) hypertension: Secondary | ICD-10-CM

## 2017-07-06 HISTORY — DX: Obstructive sleep apnea (adult) (pediatric): G47.33

## 2017-07-06 NOTE — Progress Notes (Signed)
Cardiology Office Note:    Date:  07/06/2017   ID:  Eddie [name], DOB Jun 22, 1951, MRN [id number]  PCP:  Lucious Groves, DO  Cardiologist:  Fransico Him, MD   Referring MD: Lucious Groves, DO   Chief Complaint  Patient presents with  . Sleep Apnea  . Hypertension    History of Present Illness:    Eddie [name] is a 66 y.o. male with a hx of ASCAD, DM2, Chronic systolic HF who was referred for sleep study due to CHF and severe HTN. He underwent PSG showing mild OSA with an AHI of 8.2/hr with oxygen desaturations as low as 76% with loud snoring.  He underwent CPAP titration to 11cm H2O and is now here for followup.  He is doing well with his  CPAP device and thinks that he has gotten used to it.  He tolerates the mask and feels the pressure is adequate.  Since going on CPAP, he feels rested in the am and has no significant daytime sleepiness.  He denies any significant  nasal dryness or nasal congestion but does have some mouth dryness.  He only snores a little when his mask moves.  He thinks he needs a little larger mask because it moves up over his jaw.     Past Medical History:  Diagnosis Date  . Blindness of left eye   . CAD (coronary artery disease)    a. STEMI 09/2015 w/ DES to Prox LAD  . CVA (cerebral infarction)    a. 09/2015: acute small right frontal lobe infarct  . Diabetes ([place])   . Hypertension   . Ischemic cardiomyopathy   . OSA (obstructive sleep apnea) 07/06/2017   Mild OSA with AHI of 8.2/hr with hypoxemia as low as 76% now on CPAP at 11cm H2O.  . PUD (peptic ulcer disease)     Past Surgical History:  Procedure Laterality Date  . CARDIAC CATHETERIZATION N/A 09/28/2015   Procedure: Left Heart Cath and Coronary Angiography;  Surgeon: Lorretta Harp, MD;  Location: [facility];  Service: Cardiovascular;  Laterality: N/A;  . CARDIAC CATHETERIZATION N/A 10/08/2015   Procedure: Left Heart Cath and Coronary Angiography;  Surgeon: Belva Crome, MD;   Location: [facility];  Service: Cardiovascular;  Laterality: N/A;  . CATARACT EXTRACTION Right   . Repair of Peptic Ulcer      Current Medications: Current Meds  Medication Sig  . Acetaminophen (TYLENOL) 325 MG CAPS Take 2 tablets by mouth every 4 (four) hours as needed.  Marland Kitchen albuterol (PROVENTIL HFA;VENTOLIN HFA) 108 (90 Base) MCG/ACT inhaler Inhale 2 puffs into the lungs every 6 (six) hours as needed for wheezing or shortness of breath.  Marland Kitchen amLODipine (NORVASC) 5 MG tablet Take 1 tablet (5 mg total) by mouth daily.  Marland Kitchen aspirin EC 81 MG tablet Take 1 tablet (81 mg total) by mouth daily.  Marland Kitchen atorvastatin (LIPITOR) 80 MG tablet TAKE 1 TABLET BY MOUTH ONCE DAILY AT 6 PM.  . bimatoprost (LUMIGAN) 0.03 % ophthalmic solution Place 1 drop into the right eye at bedtime.  . blood glucose meter kit and supplies KIT Dispense based on patient and insurance preference. Use up to four times daily as directed. (FOR ICD-9 250.00, 250.01).  . brimonidine (ALPHAGAN) 0.2 % ophthalmic solution Apply to eye.  . carvedilol (COREG) 25 MG tablet Take 1 tablet (25 mg total) by mouth 2 (two) times daily.  . clopidogrel (PLAVIX) 75 MG tablet Take 1 tablet (  75 mg total) by mouth daily.  . furosemide (LASIX) 20 MG tablet Take 1 tablet (20 mg total) by mouth daily.  Marland Kitchen glucose blood (TRUE METRIX BLOOD GLUCOSE TEST) test strip Use to check blood sugar two times a day  . Insulin Glargine (LANTUS) 100 UNIT/ML Solostar Pen Inject 10 Units into the skin daily at 10 pm.  . Insulin Pen Needle 31G X 5 MM MISC Use to inject insulin one time a day  . ipratropium-albuterol (DUONEB) 0.5-2.5 (3) MG/3ML SOLN Take 3 mLs by nebulization every 6 (six) hours as needed (shortness of breath).  . metFORMIN (GLUCOPHAGE) 1000 MG tablet Take 1 tablet (1,000 mg total) by mouth 2 (two) times daily with a meal.  . nitroGLYCERIN (NITROSTAT) 0.4 MG SL tablet Use one pill every 5 minutes X 3 if needed for chest pain. Contact MD if pain does not  improve  . sacubitril-valsartan (ENTRESTO) 97-103 MG Take 1 tablet by mouth 2 (two) times daily.  Marland Kitchen spironolactone (ALDACTONE) 25 MG tablet Take 1 tablet (25 mg total) by mouth daily.  . TRUEPLUS LANCETS 30G MISC Use to check blood sugar two times a day     Allergies:   Bidil [isosorb dinitrate-hydralazine]   Social History   Socioeconomic History  . Marital status: Married    Spouse name: None  . Number of children: None  . Years of education: None  . Highest education level: None  Social Needs  . Financial resource strain: None  . Food insecurity - worry: None  . Food insecurity - inability: None  . Transportation needs - medical: None  . Transportation needs - non-medical: None  Occupational History  . Occupation: Electrical engineer: [employer]  Tobacco Use  . Smoking status: Current Some Day Smoker  . Smokeless tobacco: Current User  Substance and Sexual Activity  . Alcohol use: No    Alcohol/week: 0.0 oz  . Drug use: No  . Sexual activity: None  Other Topics Concern  . None  Social History Narrative  . None     Family History: The patient's family history includes Diabetes in his mother. There is no history of Cancer.  ROS:   Please see the history of present illness.    ROS  All other systems reviewed and negative.   EKGs/Labs/Other Studies Reviewed:    The following studies were reviewed today: CPAP download  EKG:  EKG is not ordered today.   Recent Labs: 10/11/2016: B Natriuretic Peptide 119.9 01/25/2017: BUN 18; Creatinine, Ser 1.20; Potassium 3.5; Sodium 141   Recent Lipid Panel    Component Value Date/Time   CHOL 136 10/05/2015 1522   TRIG 63 01/05/2016 1310   HDL 19 (L) 10/05/2015 1522   CHOLHDL 7.2 10/05/2015 1522   VLDL 18 10/05/2015 1522   LDLCALC 99 10/05/2015 1522    Physical Exam:    VS:  BP 122/70   Pulse 87   Ht 5' 6" (1.676 m)   Wt 152 lb 12.8 oz (69.3 kg)   SpO2 99%   BMI 24.66 kg/m     Wt Readings from  Last 3 Encounters:  07/06/17 152 lb 12.8 oz (69.3 kg)  05/02/17 147 lb 3.2 oz (66.8 kg)  04/12/17 151 lb 3.2 oz (68.6 kg)     GEN:  Well nourished, well developed in no acute distress HEENT: Normal NECK: No JVD; No carotid bruits LYMPHATICS: No lymphadenopathy CARDIAC: RRR, no murmurs, rubs, gallops RESPIRATORY:  Clear to auscultation without rales, wheezing or rhonchi  ABDOMEN: Soft, non-tender, non-distended MUSCULOSKELETAL:  No edema; No deformity  SKIN: Warm and dry NEUROLOGIC:  Alert and oriented x 3 PSYCHIATRIC:  Normal affect   ASSESSMENT:    1. OSA (obstructive sleep apnea)   2. Essential hypertension    PLAN:    In order of problems listed above:  1.  OSA - the patient is tolerating PAP therapy well without any problems. The PAP download was reviewed today and showed an AHI of 1.1/hr on 11 cm H2O with 87% compliance in using more than 4 hours nightly.  The patient has been using and benefiting from CPAP use and will continue to benefit from therapy. I encouraged him to increase the humidity with his device to see if it helps with his dry mouth and take his mask to the DME to see if he needs a different size.   2.  HTN - BP is well controlled on current meds.  He will continue on Entresto, aldactone, amlodipine   Medication Adjustments/Labs and Tests Ordered: Current medicines are reviewed at length with the patient today.  Concerns regarding medicines are outlined above.  No orders of the defined types were placed in this encounter.  No orders of the defined types were placed in this encounter.   Signed, Fransico Him, MD  07/06/2017 10:51 AM    [place]

## 2017-07-06 NOTE — Patient Instructions (Signed)

## 2017-07-11 NOTE — Progress Notes (Signed)
Subjective:  HPI: [name] is a 66 y.o. male who presents for DM, HTN, left shoulder pain  Please see Assessment and Plan below for the status of his chronic medical problems.  Review of Systems: Review of Systems  Constitutional: Negative for fever.  Eyes: Negative for blurred vision.  Respiratory: Negative for shortness of breath.   Cardiovascular: Negative for chest pain and orthopnea.  Gastrointestinal: Negative for abdominal pain.  Genitourinary: Negative for frequency.  Musculoskeletal: Positive for joint pain.  Endo/Heme/Allergies: Negative for polydipsia.    Objective:  Physical Exam: Vitals:   07/12/17 1008  BP: 112/69  Pulse: 80  Temp: 98.1 F (36.7 C)  TempSrc: Oral  SpO2: 100%  Weight: 152 lb 12.8 oz (69.3 kg)  Height: 5\' 6"  (1.676 m)   Physical Exam  Constitutional: He is well-developed, well-nourished, and in no distress. No distress.  HENT:  Head: Normocephalic and atraumatic.  Eyes: Conjunctivae are normal.  Cardiovascular: Normal rate, regular rhythm, normal heart sounds and intact distal pulses.  No murmur heard. Pulmonary/Chest: Effort normal and breath sounds normal. No respiratory distress. He has no wheezes. He has no rales.  Abdominal: Soft. Bowel sounds are normal. He exhibits no distension. There is no tenderness.  Musculoskeletal: He exhibits no edema.       Right shoulder: He exhibits normal range of motion, no tenderness and no bony tenderness.       Left shoulder: He exhibits decreased range of motion (dec abduction and no "back scratch"). He exhibits no tenderness and no swelling.  Skin: Skin is warm and dry. He is not diaphoretic.  Psychiatric: Affect and judgment normal.  Nursing note and vitals reviewed.  Assessment & Plan:  Essential hypertension HPI: Reports feeling well no complaints.  A: Essential HTN- excellent control  P Continue Amlodipine 5mg , Entresto 97-103, spironolactone 25mg  daily, coreg 25mg  BID  Chronic  combined systolic and diastolic heart failure (HCC) HPI: Denies any SOB, orthopnea. Feeling well  A: chronic combined CHF  P: Continue current medications, reports f/u with Dr Eddie [name] in Jan.  Controlled type 2 diabetes mellitus with diabetic polyneuropathy, with long-term current use of insulin (HCC) HPI: No hypoglycemia, no polyuria or polydispia.  Feels he is doing great  A: Type 2 DM- controlled  P:Continue metformin 1g BID, Lantus 10units daily.  Shoulder impingement syndrome, left HPI: In September we preformed a steroid injection for his left shoulder.  He reports he "slept like a baby" that night.  Felt great for 1 month.  Has had much improvement over the last 3 to that tylenol has helped control his pain.  A: left shoulder impingement syndrome  P: -Repeat steroid injection today. - Of note he has a prominate acrominon, I wonder if he has an anatomical varient contributing to impingment, Consider Xray or POCUS at next visit.   PROCEDURE NOTE  PROCEDURE: left shoulder joint steroid injection.  PREOPERATIVE DIAGNOSIS: Bursitis of the left shoulder.  POSTOPERATIVE DIAGNOSIS: Bursitis of the left shoulder.  PROCEDURE: The patient was apprised of the risks and the benefits of the procedure and informed consent was obtained. Time-out procedure was performed, with confirmation of the patient's name, date of birth, and correct identification of the left shoulder to be injected. The patient's shoulder was then marked at the appropriate site for injection placement. The shoulder was sterilely prepped with Betadine. A 40 mg (1 milliliter) solution of Kenalog was drawn up into a 3 mL syringe with a 2 mL of 1% lidocaine. The patient  was injected with a 27 gauge needle at the lateral  aspect of his  left shoulder. There were no complications. The patient tolerated the procedure well. There was minimal bleeding. The patient was instructed to ice her shoulder upon leaving clinic and  refrain from overuse over the next 3 days. The patient was instructed to go to the emergency room with any usual pain, swelling, or redness occurred in the injected area. The patient was given a followup appointment to evaluate response to the injection to his increased range of motion and reduction of pain.   Medications Ordered No orders of the defined types were placed in this encounter.  Other Orders Orders Placed This Encounter  Procedures  . Glucose, capillary   Follow Up: Return in about 3 months (around 10/10/2017).

## 2017-07-12 ENCOUNTER — Ambulatory Visit (INDEPENDENT_AMBULATORY_CARE_PROVIDER_SITE_OTHER): Payer: Medicare HMO | Admitting: Internal Medicine

## 2017-07-12 ENCOUNTER — Other Ambulatory Visit: Payer: Self-pay

## 2017-07-12 ENCOUNTER — Encounter: Payer: Self-pay | Admitting: Internal Medicine

## 2017-07-12 ENCOUNTER — Ambulatory Visit (INDEPENDENT_AMBULATORY_CARE_PROVIDER_SITE_OTHER): Payer: Medicare HMO | Admitting: Dietician

## 2017-07-12 VITALS — BP 112/69 | HR 80 | Temp 98.1°F | Ht 66.0 in | Wt 152.8 lb

## 2017-07-12 DIAGNOSIS — Z794 Long term (current) use of insulin: Secondary | ICD-10-CM | POA: Diagnosis not present

## 2017-07-12 DIAGNOSIS — Z79899 Other long term (current) drug therapy: Secondary | ICD-10-CM | POA: Diagnosis not present

## 2017-07-12 DIAGNOSIS — I110 Hypertensive heart disease with heart failure: Secondary | ICD-10-CM | POA: Diagnosis not present

## 2017-07-12 DIAGNOSIS — I5042 Chronic combined systolic (congestive) and diastolic (congestive) heart failure: Secondary | ICD-10-CM | POA: Diagnosis not present

## 2017-07-12 DIAGNOSIS — E1142 Type 2 diabetes mellitus with diabetic polyneuropathy: Secondary | ICD-10-CM

## 2017-07-12 DIAGNOSIS — M7542 Impingement syndrome of left shoulder: Secondary | ICD-10-CM

## 2017-07-12 DIAGNOSIS — E119 Type 2 diabetes mellitus without complications: Secondary | ICD-10-CM | POA: Diagnosis not present

## 2017-07-12 DIAGNOSIS — I10 Essential (primary) hypertension: Secondary | ICD-10-CM

## 2017-07-12 LAB — POCT GLYCOSYLATED HEMOGLOBIN (HGB A1C): HEMOGLOBIN A1C: 6.2

## 2017-07-12 LAB — GLUCOSE, CAPILLARY: Glucose-Capillary: 162 mg/dL — ABNORMAL HIGH (ref 65–99)

## 2017-07-12 NOTE — Progress Notes (Signed)
  Medical Nutrition Therapy:  Appt start time: 0930 end time:  1000. Visit # 4  Assessment:  Primary concerns today: diabetes education.   Mr. Esmaili is here with his wife today. He has lost ~6-7#  from improvements in his diet, more vegetables, lean protein. He has an eye doctor appointment this Friday and is working on a Education officer, community. He has been able to get his medicine without problems. He has been drinking more water, less sweet tea and no Koolaid.   ANTHROPOMETRICS: weight-152.8#, height-66", BMI-24.66 continues in the healthy range  MEDICATIONS: lantus 10 units, metformin BLOOD SUGAR:fasting blood sugar in 140s today,  Meter download shows average of 129 (30 points lowerhigher) ,A1c 6.2% decreased from 6.8% prior to Medical nutrition therapy. He has a silver sneakers card but has not used it. He would like to run someday, but wants someone to monitoring him because of his hear condition.   DIETARY INTAKE: Usual eating pattern includes 3 meals and 0-1 snacks per day. Dining Out (times/week): 7x/week.   24 hours recall not done today Beverages: coffee with sugar~ 1 tsp and cream, McDs sweet tea 1x in past 2 months, no koolaid, juice 4 oz twice a day a few times a week and the rest is water or coffee, half & half sweet tea form wife's work  Usual physical activity: walks daily for over 30 minutes  Progress Towards Goal(s):  Some progress.   Nutritional Diagnosis:  NB-1.1 Food and nutrition-related knowledge deficit As related to lack of prior diabetes training improving  As evidenced by patient and spouse's questions and report.    Intervention:  Nutrition education about A1C and diabetes meal planning. Educated about effects of physical activity on blood sugars Coordination of care: Discuss goal of running with doctor. Would he be a candidate for cardiac or pulmonary rehab?  Teaching Method Utilized: Visual, Auditory, Hands on Handouts given during visit include:Living with Type 2 diabetes,  A1C sheet Barriers to learning/adherence to lifestyle change: material resources Demonstrated degree of understanding via: Teach Back   Monitoring/Evaluation:  Dietary intake, exercise, meter, and body weight in 3 month(s)  Aleiyah Halpin, [name], RD 07/12/2017 2:34 PM. .

## 2017-07-12 NOTE — Patient Instructions (Signed)
What we talked about today:   What the A1C test tell you- yours today was 6.2% which means your average blood sugar has been about 130 over the past 3 months. GOOD Job!!!  The plan:   Keep drinking more water, less sweetened tea and coffee and 100% juice in small amounts.  2-3 veggies a day 1-2 fruits a day  Whole grains, nut, beans and seeds Low fat protein like fish, chicken and [place]  Suggest you make an appointment to follow up with me in about 3-6 months .  Call anytime with questions or concerns  [name] Diabetes Educator [phone]

## 2017-07-13 ENCOUNTER — Encounter (HOSPITAL_COMMUNITY): Payer: Self-pay | Admitting: Internal Medicine

## 2017-07-13 ENCOUNTER — Ambulatory Visit (HOSPITAL_COMMUNITY)
Admission: RE | Admit: 2017-07-13 | Discharge: 2017-07-13 | Disposition: A | Discharge status: Home / Self Care | Payer: Medicare HMO | Source: Ambulatory Visit | Attending: Internal Medicine | Admitting: Internal Medicine

## 2017-07-13 VITALS — BP 132/85 | HR 83 | Wt 152.8 lb

## 2017-07-13 DIAGNOSIS — I110 Hypertensive heart disease with heart failure: Secondary | ICD-10-CM | POA: Insufficient documentation

## 2017-07-13 DIAGNOSIS — Z9114 Patient's other noncompliance with medication regimen: Secondary | ICD-10-CM | POA: Insufficient documentation

## 2017-07-13 DIAGNOSIS — I252 Old myocardial infarction: Secondary | ICD-10-CM | POA: Diagnosis not present

## 2017-07-13 DIAGNOSIS — E119 Type 2 diabetes mellitus without complications: Secondary | ICD-10-CM | POA: Diagnosis not present

## 2017-07-13 DIAGNOSIS — I5022 Chronic systolic (congestive) heart failure: Secondary | ICD-10-CM | POA: Diagnosis not present

## 2017-07-13 DIAGNOSIS — Z7982 Long term (current) use of aspirin: Secondary | ICD-10-CM | POA: Insufficient documentation

## 2017-07-13 DIAGNOSIS — R51 Headache: Secondary | ICD-10-CM | POA: Diagnosis not present

## 2017-07-13 DIAGNOSIS — Z8673 Personal history of transient ischemic attack (TIA), and cerebral infarction without residual deficits: Secondary | ICD-10-CM | POA: Insufficient documentation

## 2017-07-13 DIAGNOSIS — I2510 Atherosclerotic heart disease of native coronary artery without angina pectoris: Secondary | ICD-10-CM | POA: Diagnosis not present

## 2017-07-13 DIAGNOSIS — F17210 Nicotine dependence, cigarettes, uncomplicated: Secondary | ICD-10-CM | POA: Diagnosis not present

## 2017-07-13 DIAGNOSIS — M79601 Pain in right arm: Secondary | ICD-10-CM | POA: Diagnosis not present

## 2017-07-13 DIAGNOSIS — Z720 Tobacco use: Secondary | ICD-10-CM | POA: Diagnosis not present

## 2017-07-13 DIAGNOSIS — H5462 Unqualified visual loss, left eye, normal vision right eye: Secondary | ICD-10-CM | POA: Diagnosis not present

## 2017-07-13 DIAGNOSIS — Z7902 Long term (current) use of antithrombotics/antiplatelets: Secondary | ICD-10-CM | POA: Insufficient documentation

## 2017-07-13 DIAGNOSIS — I255 Ischemic cardiomyopathy: Secondary | ICD-10-CM | POA: Insufficient documentation

## 2017-07-13 DIAGNOSIS — Z8711 Personal history of peptic ulcer disease: Secondary | ICD-10-CM | POA: Diagnosis not present

## 2017-07-13 DIAGNOSIS — Z79899 Other long term (current) drug therapy: Secondary | ICD-10-CM | POA: Insufficient documentation

## 2017-07-13 DIAGNOSIS — G4733 Obstructive sleep apnea (adult) (pediatric): Secondary | ICD-10-CM

## 2017-07-13 DIAGNOSIS — Z955 Presence of coronary angioplasty implant and graft: Secondary | ICD-10-CM | POA: Insufficient documentation

## 2017-07-13 DIAGNOSIS — Z794 Long term (current) use of insulin: Secondary | ICD-10-CM | POA: Insufficient documentation

## 2017-07-13 DIAGNOSIS — R42 Dizziness and giddiness: Secondary | ICD-10-CM | POA: Insufficient documentation

## 2017-07-13 DIAGNOSIS — Z888 Allergy status to other drugs, medicaments and biological substances status: Secondary | ICD-10-CM | POA: Insufficient documentation

## 2017-07-13 DIAGNOSIS — Z833 Family history of diabetes mellitus: Secondary | ICD-10-CM | POA: Insufficient documentation

## 2017-07-13 DIAGNOSIS — J9601 Acute respiratory failure with hypoxia: Secondary | ICD-10-CM | POA: Diagnosis not present

## 2017-07-13 LAB — BASIC METABOLIC PANEL
Anion gap: 7 (ref 5–15)
BUN: 21 mg/dL — AB (ref 6–20)
CHLORIDE: 104 mmol/L (ref 101–111)
CO2: 26 mmol/L (ref 22–32)
Calcium: 9 mg/dL (ref 8.9–10.3)
Creatinine, Ser: 0.91 mg/dL (ref 0.61–1.24)
Glucose, Bld: 129 mg/dL — ABNORMAL HIGH (ref 65–99)
POTASSIUM: 4.1 mmol/L (ref 3.5–5.1)
SODIUM: 137 mmol/L (ref 135–145)

## 2017-07-13 NOTE — Assessment & Plan Note (Signed)
HPI: Reports feeling well no complaints.  A: Essential HTN- excellent control  P Continue Amlodipine 5mg , Entresto 97-103, spironolactone 25mg  daily, coreg 25mg  BID

## 2017-07-13 NOTE — Patient Instructions (Signed)
Labs drawn today (if we do not call you, then your lab work was stable)   Your physician recommends that you schedule a follow-up appointment in: 6 months with Dr. Gala Romney  (we will call you)

## 2017-07-13 NOTE — Assessment & Plan Note (Signed)
HPI: No hypoglycemia, no polyuria or polydispia.  Feels he is doing great  A: Type 2 DM- controlled  P:Continue metformin 1g BID, Lantus 10units daily.

## 2017-07-13 NOTE — Assessment & Plan Note (Signed)
HPI: In September we preformed a steroid injection for his left shoulder.  He reports he "slept like a baby" that night.  Felt great for 1 month.  Has had much improvement over the last 3 to that tylenol has helped control his pain.  A: left shoulder impingement syndrome  P: -Repeat steroid injection today. - Of note he has a prominate acrominon, I wonder if he has an anatomical varient contributing to impingment, Consider Xray or POCUS at next visit.

## 2017-07-13 NOTE — Addendum Note (Signed)
Encounter addended by: Teresa Coombs, RN on: 07/13/2017 12:29 PM  Actions taken: Diagnosis association updated, Order list changed, Sign clinical note

## 2017-07-13 NOTE — Progress Notes (Signed)
Advanced Heart Failure Clinic Note    HPI:  Eddie [name] is a 66 y/o male with h/o DM2, CAD s/p NSTEMI with LAD stent in 3/17, CVA, chronic systolic HF with EF 61-60%.   Had multiple admissions in May and June 2017 in the setting of decompensated CHF and EColi bacteremia. Readmitted in June 2017 with acute respiratory failure requiring intubation in setting of severe HTN and ADHF. Treated with milrinone which was eventually weaned off.     He presents today for HF follow up. Says he feels much better after stating CPAP. Denies SOB. Says legs get tired when walking mostly in thigh R>L. No CP, orthopnea or PND. No edema. Taking all meds without problem. Having pain in R arm. Hurts mostly when sleeping. Improved with cortisone shot.  Smokes a few cigarettes per day. Taking lasix '20mg'$  bid    Echo 01/11/17: EF 40%.   RV ok. Personally reviewed ECHO 06/19/2016: EF 20-25%.Cannot exclude small  laminar apical thrombus. No mobile or protuberant thrombus is  seen. Echo 5/15 EF 15-20%   Past Medical History:  Diagnosis Date  . Blindness of left eye   . CAD (coronary artery disease)    a. STEMI 09/2015 w/ DES to Prox LAD  . CVA (cerebral infarction)    a. 09/2015: acute small right frontal lobe infarct  . Diabetes ([place])   . Hypertension   . Ischemic cardiomyopathy   . OSA (obstructive sleep apnea) 07/06/2017   Mild OSA with AHI of 8.2/hr with hypoxemia as low as 76% now on CPAP at 11cm H2O.  . PUD (peptic ulcer disease)     Current Outpatient Medications  Medication Sig Dispense Refill  . Acetaminophen (TYLENOL) 325 MG CAPS Take 2 tablets by mouth every 4 (four) hours as needed.    Marland Kitchen albuterol (PROVENTIL HFA;VENTOLIN HFA) 108 (90 Base) MCG/ACT inhaler Inhale 2 puffs into the lungs every 6 (six) hours as needed for wheezing or shortness of breath. 1 Inhaler 2  . amLODipine (NORVASC) 5 MG tablet Take 1 tablet (5 mg total) by mouth daily. 30 tablet 3  . aspirin EC 81 MG tablet Take 1 tablet  (81 mg total) by mouth daily. 90 tablet 3  . atorvastatin (LIPITOR) 80 MG tablet TAKE 1 TABLET BY MOUTH ONCE DAILY AT 6 PM. 30 tablet 11  . bimatoprost (LUMIGAN) 0.03 % ophthalmic solution Place 1 drop into the right eye at bedtime.    . blood glucose meter kit and supplies KIT Dispense based on patient and insurance preference. Use up to four times daily as directed. (FOR ICD-9 250.00, 250.01). 1 each 0  . brimonidine (ALPHAGAN) 0.2 % ophthalmic solution Apply to eye.    . carvedilol (COREG) 25 MG tablet Take 1 tablet (25 mg total) by mouth 2 (two) times daily. 60 tablet 3  . clopidogrel (PLAVIX) 75 MG tablet Take 1 tablet (75 mg total) by mouth daily. 30 tablet 6  . furosemide (LASIX) 20 MG tablet Take 1 tablet (20 mg total) by mouth daily. 30 tablet 6  . glucose blood (TRUE METRIX BLOOD GLUCOSE TEST) test strip Use to check blood sugar two times a day 100 each 5  . Insulin Glargine (LANTUS) 100 UNIT/ML Solostar Pen Inject 10 Units into the skin daily at 10 pm. 45 mL 1  . Insulin Pen Needle 31G X 5 MM MISC Use to inject insulin one time a day 100 each 5  . ipratropium-albuterol (DUONEB) 0.5-2.5 (3) MG/3ML SOLN Take 3  mLs by nebulization every 6 (six) hours as needed (shortness of breath). 90 mL 0  . metFORMIN (GLUCOPHAGE) 1000 MG tablet Take 1 tablet (1,000 mg total) by mouth 2 (two) times daily with a meal. 180 tablet 3  . nitroGLYCERIN (NITROSTAT) 0.4 MG SL tablet Use one pill every 5 minutes X 3 if needed for chest pain. Contact MD if pain does not improve 30 tablet 0  . sacubitril-valsartan (ENTRESTO) 97-103 MG Take 1 tablet by mouth 2 (two) times daily. 60 tablet 3  . spironolactone (ALDACTONE) 25 MG tablet Take 1 tablet (25 mg total) by mouth daily. 30 tablet 5  . TRUEPLUS LANCETS 30G MISC Use to check blood sugar two times a day 100 each 5   No current facility-administered medications for this encounter.     Allergies  Allergen Reactions  . Bidil [Isosorb Dinitrate-Hydralazine] Other  (See Comments)    Patient became lightheaded with 1 tablet tid.       Social History   Socioeconomic History  . Marital status: Married    Spouse name: Not on file  . Number of children: Not on file  . Years of education: Not on file  . Highest education level: Not on file  Social Needs  . Financial resource strain: Not on file  . Food insecurity - worry: Not on file  . Food insecurity - inability: Not on file  . Transportation needs - medical: Not on file  . Transportation needs - non-medical: Not on file  Occupational History  . Occupation: Electrical engineer: [employer]  Tobacco Use  . Smoking status: Current Some Day Smoker  . Smokeless tobacco: Current User  Substance and Sexual Activity  . Alcohol use: No    Alcohol/week: 0.0 oz  . Drug use: No  . Sexual activity: Not on file  Other Topics Concern  . Not on file  Social History Narrative  . Not on file      Family History  Problem Relation Age of Onset  . Diabetes Mother   . Cancer Neg Hx     Vitals:   07/13/17 1201  BP: 132/85  Pulse: 83  SpO2: 100%  Weight: 152 lb 12.8 oz (69.3 kg)   Wt Readings from Last 3 Encounters:  07/13/17 152 lb 12.8 oz (69.3 kg)  07/12/17 152 lb 12.8 oz (69.3 kg)  07/06/17 152 lb 12.8 oz (69.3 kg)     PHYSICAL EXAM: General:  Well appearing. No resp difficulty HEENT: normal x for poor dentition Neck: supple. no JVD. Carotids 2+ bilat; no bruits. No lymphadenopathy or thryomegaly appreciated. Cor: PMI nondisplaced. Regular rate & rhythm. No rubs, gallops or murmurs. Lungs: clear decreased throughout Abdomen: soft, nontender, nondistended. No hepatosplenomegaly. No bruits or masses. Good bowel sounds. Extremities: no cyanosis, clubbing, rash, edema Neuro: alert & orientedx3, cranial nerves grossly intact. moves all 4 extremities w/o difficulty. Affect pleasant   ASSESSMENT & PLAN: 1. Chronicsystolic CHF : ICM May 7588 EF 15-20% and 12/17 EF 25-30%. Echo  12/2016 EF improved 40% - Stable NYHA II - Volume status looks good on exam - Continue Entresto 97/103 mg BID - Continue Coreg to 25 mg BID - Continue Spiro 25 mg daily - Intolerant to Bidil in the past with dizziness and headaches.  - BMET today. Can decrease lasix if creatinine is up   2. CAD: s/p DES to proximal LAD in 09/2015  - No s/s ischemia  - Continue ASA,  Plavix and atorvastatin.   3. HTN - BP elevated at last visit but now improved with med titration.  4. 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  - Continue ASA and Plavix.   4. DMII - Follows with Internal Medicine Clinic.  - Consider Jardiance  5. OSA - Feels better with CPAP     6. Tobacco use - Encouraged cessation.    Glori Bickers, MD  12:19 PM

## 2017-07-13 NOTE — Assessment & Plan Note (Signed)
HPI: Denies any SOB, orthopnea. Feeling well  A: chronic combined CHF  P: Continue current medications, reports f/u with Dr Gala Romney in Jan.

## 2017-07-27 DIAGNOSIS — J9601 Acute respiratory failure with hypoxia: Secondary | ICD-10-CM | POA: Diagnosis not present

## 2017-07-27 DIAGNOSIS — G4733 Obstructive sleep apnea (adult) (pediatric): Secondary | ICD-10-CM | POA: Diagnosis not present

## 2017-07-30 ENCOUNTER — Other Ambulatory Visit (HOSPITAL_COMMUNITY): Payer: Self-pay | Admitting: Internal Medicine

## 2017-07-30 MED FILL — ATORVASTATIN 80 MG TABLET: 80 | 30 days supply | Qty: 30 | Fill #3

## 2017-07-30 MED FILL — FUROSEMIDE 20 MG TABS: 20 | 30 days supply | Qty: 60 | Fill #6

## 2017-07-30 MED FILL — LANTUS SOLOSTAR 100 UNITS/M: 100 | 84 days supply | Qty: 9 | Fill #1

## 2017-07-30 MED FILL — SPIRONOLACTONE 25 MG TABLET: 25 | 30 days supply | Qty: 30 | Fill #1

## 2017-07-30 MED FILL — ENTRESTO 97 MG-103 MG TAB: 97-103 | 30 days supply | Qty: 60 | Fill #2

## 2017-07-30 MED FILL — CLOPIDOGREL 75 MG TABLET: 75 | 30 days supply | Qty: 30 | Fill #6

## 2017-08-01 MED FILL — AMLODIPINE BESYLATE 5 MG TA: 5 | 30 days supply | Qty: 30 | Fill #0

## 2017-08-28 ENCOUNTER — Other Ambulatory Visit (HOSPITAL_COMMUNITY): Payer: Self-pay | Admitting: Internal Medicine

## 2017-08-28 MED FILL — ATORVASTATIN 80 MG TABLET: 80 | 30 days supply | Qty: 30 | Fill #4

## 2017-08-28 MED FILL — ENTRESTO 97 MG-103 MG TAB: 97-103 | 30 days supply | Qty: 60 | Fill #3

## 2017-08-28 MED FILL — AMLODIPINE BESYLATE 5 MG TA: 5 | 30 days supply | Qty: 30 | Fill #1

## 2017-08-28 MED FILL — SPIRONOLACTONE 25 MG TABLET: 25 | 30 days supply | Qty: 30 | Fill #2

## 2017-08-30 MED FILL — CLOPIDOGREL 75 MG TABLET: 75 | 30 days supply | Qty: 30 | Fill #0

## 2017-08-30 MED FILL — FUROSEMIDE 20 MG TABS: 20 | 30 days supply | Qty: 60 | Fill #0

## 2017-09-04 MED FILL — UNIFINE PENTIPS 31GX3/16: 31G X 5 MM | 90 days supply | Qty: 100 | Fill #2

## 2017-09-04 MED FILL — UNIFINE PENTIPS 31GX3/16": 31G X 5 MM | 90 days supply | Qty: 100 | Fill #2

## 2017-09-04 MED FILL — CARVEDILOL 12.5 MG TABLET: 12.5 | 90 days supply | Qty: 270 | Fill #1

## 2017-09-24 MED FILL — metFORMIN HCL 1000 MG TABS: 1000 | 90 days supply | Qty: 180 | Fill #1

## 2017-09-24 MED FILL — ATORVASTATIN 80 MG TABLET: 80 | 30 days supply | Qty: 30 | Fill #5

## 2017-09-27 ENCOUNTER — Other Ambulatory Visit (HOSPITAL_COMMUNITY): Payer: Self-pay | Admitting: Internal Medicine

## 2017-09-27 MED FILL — CLOPIDOGREL 75 MG TABLET: 75 | 30 days supply | Qty: 30 | Fill #1

## 2017-09-27 MED FILL — ENTRESTO 97 MG-103 MG TAB: 97-103 | 30 days supply | Qty: 60 | Fill #0

## 2017-09-28 MED FILL — LATANOPROST 0.005% EYE DROP: 0.005 | 25 days supply | Qty: 3 | Fill #1

## 2017-09-28 MED FILL — DORZOLAMIDE-TIMOLOL EYE DRP: 22.3-6.8 | 50 days supply | Qty: 10 | Fill #1

## 2017-10-09 MED FILL — SPIRONOLACTONE 25 MG TABLET: 25 | 30 days supply | Qty: 30 | Fill #3

## 2017-10-09 MED FILL — FUROSEMIDE 20 MG TABS: 20 | 30 days supply | Qty: 60 | Fill #1

## 2017-10-09 MED FILL — AMLODIPINE BESYLATE 5 MG TA: 5 | 30 days supply | Qty: 30 | Fill #2

## 2017-10-16 ENCOUNTER — Telehealth: Payer: Self-pay | Admitting: *Deleted

## 2017-10-16 MED ORDER — ACCU-CHEK FASTCLIX LANCETS MISC: 2 refills | Status: DC

## 2017-10-16 MED ORDER — GLUCOSE BLOOD VI STRP: ORAL_STRIP | 2 refills | Status: DC

## 2017-10-16 MED ORDER — ACCU-CHEK GUIDE W/DEVICE KIT: 1.0000 | PACK | Freq: Two times a day (BID) | 0 refills | Status: DC

## 2017-10-16 MED FILL — ACCU-CHEK GUIDE MONITOR SYS: W/DEVICE | 30 days supply | Qty: 1 | Fill #0

## 2017-10-16 MED FILL — ACCU-CHEK FASTCLIX LANCETS: 25 days supply | Qty: 102 | Fill #0

## 2017-10-16 MED FILL — ACCU-CHEK GUIDE STRP: 25 days supply | Qty: 100 | Fill #0

## 2017-10-16 NOTE — Telephone Encounter (Signed)
Agree, thank you

## 2017-10-16 NOTE — Telephone Encounter (Signed)
Received fax from [facility] requesting change in glucometer, test strips and lancets from True Metrix to Accu-Chek Guide as this is preferred by patient's insurance. Patient's Last OV 07/12/2017 and last A1c 6.2 at that time. Verbal authorization given to [name] at [facility] for glucometer, lancets and strips # 100 with 2 refills. Updated  patient's med list to reflect this change. Kinnie Feil, RN, BSN

## 2017-11-01 ENCOUNTER — Ambulatory Visit (INDEPENDENT_AMBULATORY_CARE_PROVIDER_SITE_OTHER): Payer: Medicare Other | Admitting: Internal Medicine

## 2017-11-01 ENCOUNTER — Encounter: Payer: Self-pay | Admitting: Internal Medicine

## 2017-11-01 ENCOUNTER — Ambulatory Visit (INDEPENDENT_AMBULATORY_CARE_PROVIDER_SITE_OTHER): Payer: Medicare Other | Admitting: Dietician

## 2017-11-01 ENCOUNTER — Encounter: Payer: Self-pay | Admitting: Dietician

## 2017-11-01 VITALS — BP 139/69 | HR 85 | Temp 98.7°F | Wt 158.8 lb

## 2017-11-01 DIAGNOSIS — I5042 Chronic combined systolic (congestive) and diastolic (congestive) heart failure: Secondary | ICD-10-CM | POA: Diagnosis not present

## 2017-11-01 DIAGNOSIS — Z713 Dietary counseling and surveillance: Secondary | ICD-10-CM | POA: Diagnosis not present

## 2017-11-01 DIAGNOSIS — E1142 Type 2 diabetes mellitus with diabetic polyneuropathy: Secondary | ICD-10-CM

## 2017-11-01 DIAGNOSIS — G4733 Obstructive sleep apnea (adult) (pediatric): Secondary | ICD-10-CM

## 2017-11-01 DIAGNOSIS — Z9989 Dependence on other enabling machines and devices: Secondary | ICD-10-CM | POA: Diagnosis not present

## 2017-11-01 DIAGNOSIS — I110 Hypertensive heart disease with heart failure: Secondary | ICD-10-CM | POA: Diagnosis not present

## 2017-11-01 DIAGNOSIS — Z794 Long term (current) use of insulin: Secondary | ICD-10-CM | POA: Diagnosis not present

## 2017-11-01 DIAGNOSIS — Z6825 Body mass index (BMI) 25.0-25.9, adult: Secondary | ICD-10-CM | POA: Diagnosis not present

## 2017-11-01 DIAGNOSIS — Z79899 Other long term (current) drug therapy: Secondary | ICD-10-CM

## 2017-11-01 DIAGNOSIS — I10 Essential (primary) hypertension: Secondary | ICD-10-CM

## 2017-11-01 LAB — GLUCOSE, CAPILLARY: GLUCOSE-CAPILLARY: 131 mg/dL — AB (ref 65–99)

## 2017-11-01 LAB — POCT GLYCOSYLATED HEMOGLOBIN (HGB A1C): HEMOGLOBIN A1C: 7.7

## 2017-11-01 MED FILL — AMLODIPINE BESYLATE 5 MG TA: 5 | 30 days supply | Qty: 30 | Fill #3

## 2017-11-01 MED FILL — ENTRESTO 97 MG-103 MG TAB: 97-103 | 30 days supply | Qty: 60 | Fill #1

## 2017-11-01 MED FILL — CLOPIDOGREL 75 MG TABLET: 75 | 30 days supply | Qty: 30 | Fill #2

## 2017-11-01 MED FILL — SPIRONOLACTONE 25 MG TABLET: 25 | 30 days supply | Qty: 30 | Fill #4

## 2017-11-01 MED FILL — FUROSEMIDE 20 MG TABS: 20 | 30 days supply | Qty: 60 | Fill #2

## 2017-11-01 MED FILL — ATORVASTATIN 80 MG TABLET: 80 | 30 days supply | Qty: 30 | Fill #6

## 2017-11-01 MED FILL — LANTUS SOLOSTAR 100 UNITS/M: 100 | 84 days supply | Qty: 9 | Fill #2

## 2017-11-01 NOTE — Patient Instructions (Addendum)
To help lower your A1C you can try the following:  Check blood sugars in morning and before dinner and try to get your readings before meals under 130  Try 1 teaspoon of sugar in your tea and coffee, drink more water.   Batteries 2.and something dollars... At [facility]   See you May 9th at 8:15 am. Bring your new meter and any questions you have.   Eddie [name] [phone]

## 2017-11-01 NOTE — Assessment & Plan Note (Signed)
HPI: No issues doing well.  A: Essential HTN  P: Cotninue Spironolactone, entresto, coreg, amlodipine.

## 2017-11-01 NOTE — Progress Notes (Signed)
  Medical Nutrition Therapy:  Appt start time: 0920 end time:  1010 Visit # 5  Assessment:  Primary concerns today: diabetes education.   Eddie [name] is here with his wife today. He regained the weight ~6-7#  that he had lost and A1C increased to 7.7% during the same time.  He is working on getting teeth extracted. He has a new meter today- Accu chek Guide that he asks for help learning to use.   ANTHROPOMETRICS:Estimated body mass index is 25.63 kg/m as calculated from the following:   Height as of 07/12/17: 5\' 6"  (1.676 m).   Weight as of an earlier encounter on 11/01/17: 158 lb 12.8 oz (72 kg).  MEDICATIONS: lantus 10 units, metformin at max dose BLOOD SUGAR:fasting blood sugars had increased per his meter readings from February to 180-200s, but today is 121.    DIETARY INTAKE: Usual eating pattern includes 3 meals and 0-1 snacks per day. Dining Out (times/week): 7x/week.   24 hours recall not done today Beverages: coffee with sugar~ 2 tsp and cream, McDs sweet tea half and half, "not enough water"  Usual physical activity: walks daily for over 30 minutes  Progress Towards Goal(s):  Some progress.   Nutritional Diagnosis:  NB-1.1 Food and nutrition-related knowledge deficit As related to lack of prior diabetes training improving  As evidenced by patient and spouse's questions and report but still needs reinforcement.    Intervention:  Nutrition education about target blood sugars,  Interpreting values and using to make changes. Taught him how to use new meter. Reccommended twice a day testing to assess Pm blood sugars Coordination of care: consider increased in insulin if unable to lower. Teaching Method Utilized: Visual, Auditory, Hands on Handouts given during visit include: handout - staying off the roller coaster Barriers to learning/adherence to lifestyle change: material resources Demonstrated degree of understanding via: Teach Back   Monitoring/Evaluation:  Dietary intake,  exercise, meter, and body weight in 1 month(s)  [name], RD 11/01/2017 10:17 AM. .

## 2017-11-01 NOTE — Assessment & Plan Note (Signed)
HPI: Dysnpnea with walking up stairs.  Otherwise fine with moving about house and ADLs  A: Chronic combined CHF  P: Stable, has follow up with [name] in 2 months.  Continue current management.

## 2017-11-01 NOTE — Addendum Note (Signed)
Addended by: Gust Rung on: 11/01/2017 09:28 AM   Modules accepted: Orders

## 2017-11-01 NOTE — Assessment & Plan Note (Signed)
Using CPAP 

## 2017-11-01 NOTE — Progress Notes (Signed)
  Subjective:  HPI: Eddie [name] is a 67 y.o. male who presents for DM, HTN.  Please see Assessment and Plan below for the status of his chronic medical problems.  Review of Systems: Review of Systems  Eyes: Negative for blurred vision.  Respiratory: Negative for shortness of breath.   Cardiovascular: Negative for chest pain and orthopnea.  Gastrointestinal: Negative for abdominal pain.  Genitourinary: Negative for frequency.  Endo/Heme/Allergies: Negative for polydipsia.    Objective:  Physical Exam: Vitals:   11/01/17 0836  BP: 139/69  Pulse: 85  Temp: 98.7 F (37.1 C)  TempSrc: Oral  SpO2: 100%  Weight: 158 lb 12.8 oz (72 kg)   Physical Exam  Constitutional: He is well-developed, well-nourished, and in no distress. No distress.  HENT:  Head: Normocephalic and atraumatic.  Eyes: Conjunctivae are normal.  Cardiovascular: Normal rate, regular rhythm, normal heart sounds and intact distal pulses.  No murmur heard. Pulmonary/Chest: Effort normal and breath sounds normal. No respiratory distress. He has no wheezes. He has no rales.  Abdominal: Soft. Bowel sounds are normal. He exhibits no distension. There is no tenderness.  Musculoskeletal: He exhibits no edema.  Skin: Skin is warm and dry. He is not diaphoretic.  Psychiatric: Affect and judgment normal.  Nursing note and vitals reviewed.  Assessment & Plan:  Essential hypertension HPI: No issues doing well.  A: Essential HTN  P: Cotninue Spironolactone, entresto, coreg, amlodipine.   Chronic combined systolic and diastolic heart failure (HCC) HPI: Dysnpnea with walking up stairs.  Otherwise fine with moving about house and ADLs  A: Chronic combined CHF  P: Stable, has follow up with Eddie [name] in 2 months.  Continue current management.  OSA (obstructive sleep apnea) Using CPAP  Controlled type 2 diabetes mellitus with diabetic polyneuropathy, with long-term current use of insulin (HCC) HPI:  Asymptomatic, stopped checking sugars due to not knowing how to use new meter.  To see Eddie [name] for education today.  A: Controlled Type 2 DM  P: A1c has increased some to 7.7, likely due to diet indiscretion. Continue metformin 1g BID Continue Lantus 10units daily      Medications Ordered No orders of the defined types were placed in this encounter.  Other Orders Orders Placed This Encounter  Procedures  . Glucose, capillary  . POC Hbg A1C   Follow Up: Return in about 3 months (around 01/31/2018).

## 2017-11-01 NOTE — Assessment & Plan Note (Signed)
HPI: Asymptomatic, stopped checking sugars due to not knowing how to use new meter.  To see [name] for education today.  A: Controlled Type 2 DM  P: A1c has increased some to 7.7, likely due to diet indiscretion. Continue metformin 1g BID Continue Lantus 10units daily

## 2017-11-08 MED FILL — CARVEDILOL 12.5 MG TABLET: 12.5 | 90 days supply | Qty: 270 | Fill #2

## 2017-11-29 ENCOUNTER — Ambulatory Visit (INDEPENDENT_AMBULATORY_CARE_PROVIDER_SITE_OTHER): Payer: Medicare Other | Admitting: Dietician

## 2017-11-29 ENCOUNTER — Encounter: Payer: Self-pay | Admitting: Dietician

## 2017-11-29 ENCOUNTER — Telehealth: Payer: Self-pay | Admitting: Dietician

## 2017-11-29 DIAGNOSIS — E1142 Type 2 diabetes mellitus with diabetic polyneuropathy: Secondary | ICD-10-CM | POA: Diagnosis not present

## 2017-11-29 DIAGNOSIS — Z6825 Body mass index (BMI) 25.0-25.9, adult: Secondary | ICD-10-CM | POA: Diagnosis not present

## 2017-11-29 DIAGNOSIS — Z794 Long term (current) use of insulin: Secondary | ICD-10-CM | POA: Diagnosis not present

## 2017-11-29 DIAGNOSIS — Z713 Dietary counseling and surveillance: Secondary | ICD-10-CM

## 2017-11-29 MED ORDER — ACCU-CHEK FASTCLIX LANCETS MISC: 2 refills | Status: DC

## 2017-11-29 MED ORDER — GLUCOSE BLOOD VI STRP: ORAL_STRIP | 12 refills | Status: DC

## 2017-11-29 MED FILL — ACCU-CHEK FASTCLIX LANCETS: 34 days supply | Qty: 102 | Fill #0

## 2017-11-29 MED FILL — ACCU-CHEK GUIDE STRP: 33 days supply | Qty: 100 | Fill #0

## 2017-11-29 NOTE — Progress Notes (Signed)
  Medical Nutrition Therapy:  Appt start time: 0828 end time:  0914 Visit # 6  Assessment:  Primary concerns today: diabetes education.   Eddie [name] is here with his wife today. His weight is about the same as last visit. He is still working on getting teeth extracted.  MEDICATIONS: lantus 10 units, metformin at max dose BLOOD SUGAR:ran out of strips and did not have batteries for meter so checked less this past month: 23x/30 days, with average 171, range 105 to 290,4 readings in PM and all >200 mg/dl, fasting readings average 162.  ANTHROPOMETRICS:Estimated body mass index is 25.37 kg/m as calculated from the following:   Height as of 07/12/17: 5\' 6"  (1.676 m).   Weight as of this encounter: 157 lb 3.2 oz (71.3 kg).  DIETARY INTAKE: Usual eating pattern includes 3 meals and 0-1 snacks per day. Dining Out (times/week): 7x/week.   24 hours recall not done today, eating candy bars when he is hungry or wants something sweet Beverages: coffee with sugar~ 2 tsp and cream, McDs sweet tea half and half, "not enough water"  Usual physical activity: walks daily for over 30 minutes   Progress Towards Goal(s):  Some progress.   Nutritional Diagnosis:  NB-1.1 Food and nutrition-related knowledge deficit As related to lack of prior diabetes training improving  As evidenced by patient and spouse's questions and report but still needs reinforcement.    Intervention:  Nutrition education about target blood sugars,  Interpreting values and using to make changes. Recommend 3x/day testing to assess Pm blood sugars better Coordination of care: If pattern continues lower in am and higher in PM like it appears to be today with A1C above goal- consider empagliflozin added to metformin. Eddie [name]) with CKD and CHF.   Teaching Method Utilized: Visual, Auditory, Hands on Handouts given during visit include: handout - staying off the roller coaster Barriers to learning/adherence to lifestyle change: material  resources Demonstrated degree of understanding via: Teach Back   Monitoring/Evaluation:  Dietary intake, exercise, meter, and body weight in 1 month(s)  Eddie [name], RD 11/29/2017 9:16 AM. .

## 2017-11-29 NOTE — Telephone Encounter (Signed)
Requests more strips so he can check his blood sugar more often in the evening

## 2017-11-29 NOTE — Patient Instructions (Addendum)
Your blood sugars appear to be about the same.  Need to try as bit more changes to see if it will come lower  1- Check blood sugar more often 2- try to eat higher fat and protein foods when hungry- especially at night as we discussed.   Daisey Must with a dentist.  See you in June.

## 2017-12-07 ENCOUNTER — Other Ambulatory Visit (HOSPITAL_COMMUNITY): Payer: Self-pay | Admitting: Internal Medicine

## 2017-12-07 MED FILL — AMLODIPINE BESYLATE 5 MG TA: 5 | 30 days supply | Qty: 30 | Fill #0

## 2017-12-07 MED FILL — CLOPIDOGREL 75 MG TABLET: 75 | 30 days supply | Qty: 30 | Fill #3

## 2017-12-07 MED FILL — ENTRESTO 97 MG-103 MG TAB: 97-103 | 30 days supply | Qty: 60 | Fill #2

## 2017-12-07 MED FILL — FUROSEMIDE 20 MG TABS: 20 | 30 days supply | Qty: 60 | Fill #3

## 2017-12-07 MED FILL — ATORVASTATIN 80 MG TABLET: 80 | 30 days supply | Qty: 30 | Fill #7

## 2017-12-07 MED FILL — SPIRONOLACTONE 25 MG TABLET: 25 | 30 days supply | Qty: 30 | Fill #5

## 2017-12-14 MED FILL — LATANOPROST 0.005% EYE DRP: 0.005 | 25 days supply | Qty: 3 | Fill #2

## 2017-12-14 MED FILL — DORZOLAMIDE-TIMOLOL EYE DRP: 22.3-6.8 | 50 days supply | Qty: 10 | Fill #2

## 2017-12-25 ENCOUNTER — Other Ambulatory Visit: Payer: Self-pay | Admitting: *Deleted

## 2017-12-25 DIAGNOSIS — Z794 Long term (current) use of insulin: Principal | ICD-10-CM

## 2017-12-25 DIAGNOSIS — E118 Type 2 diabetes mellitus with unspecified complications: Secondary | ICD-10-CM

## 2017-12-25 MED ORDER — METFORMIN HCL 1000 MG PO TABS: 1000.0000 mg | ORAL_TABLET | Freq: Two times a day (BID) | ORAL | 3 refills | Status: DC

## 2017-12-25 MED FILL — UNIFINE PENTIPS 31GX3/16": 31G X 5 MM | 90 days supply | Qty: 100 | Fill #3

## 2017-12-25 MED FILL — metFORMIN HCL 1000 MG TABS: 1000 | 90 days supply | Qty: 180 | Fill #0

## 2017-12-25 MED FILL — UNIFINE PENTIPS 31GX3/16: 31G X 5 MM | 90 days supply | Qty: 100 | Fill #3

## 2017-12-27 ENCOUNTER — Ambulatory Visit (INDEPENDENT_AMBULATORY_CARE_PROVIDER_SITE_OTHER): Payer: Medicare Other | Admitting: Dietician

## 2017-12-27 ENCOUNTER — Encounter: Payer: Self-pay | Admitting: Dietician

## 2017-12-27 DIAGNOSIS — Z713 Dietary counseling and surveillance: Secondary | ICD-10-CM

## 2017-12-27 DIAGNOSIS — Z6825 Body mass index (BMI) 25.0-25.9, adult: Secondary | ICD-10-CM

## 2017-12-27 DIAGNOSIS — E1142 Type 2 diabetes mellitus with diabetic polyneuropathy: Secondary | ICD-10-CM | POA: Diagnosis not present

## 2017-12-27 DIAGNOSIS — Z794 Long term (current) use of insulin: Secondary | ICD-10-CM

## 2017-12-27 NOTE — Progress Notes (Signed)
  Medical Nutrition Therapy:  Appt start time: 0828 end time:  0914 Visit # 7  Assessment:  Primary concerns today: diabetes education.   Eddie [name] is here with his wife today. His weight is about the same. He is concerned about some readings in the 200s. He is still working on getting teeth extracted so eating softer foods than he'd like. He and wife seem surprised about the affect of high carb foods on his blood sugars.  MEDICATIONS: lantus 10 units daily, metformin at max dose- ran out for two days, restarted yesterday BLOOD SUGAR:past 3 months: 1 test/day- average 158- most in target ~25% above, no lows.  30 day download- 2x/day testing, average 152, fasting mostly in target but trend is rising over the day. Most tests are premeal, so anticipate higher actual average/A1C Breakfast Lunch  Dinner  Bedtime 132-140 153  179-169 185  ANTHROPOMETRICS:Estimated body mass index is 25.42 kg/m as calculated from the following:   Height as of 07/12/17: 5\' 6"  (1.676 m).   Weight as of this encounter: 157 lb 8 oz (71.4 kg).  Wt Readings from Last 3 Encounters:  12/27/17 157 lb 8 oz (71.4 kg)  11/29/17 157 lb 3.2 oz (71.3 kg)  11/01/17 158 lb 12.8 oz (72 kg)   DIETARY INTAKE: Usual eating pattern includes 3 meals and 0-1 snacks per day. Dining Out (times/week): 7x/week.  He is eating and drinking higher carb foods causing higher blood sugars.  Example: Eddie [name], 1/3 can regular orange soda and 16 or so ounce  Milkshake Other Beverages: coffee with sugar~ 2 tsp and cream, [employer] sweet tea half and half, "not enough water"  Usual physical activity: walks daily for over 30 minutes   Progress Towards Goal(s):  Some progress.   Nutritional Diagnosis:  NB-1.1 Food and nutrition-related knowledge deficit As related to lack of prior diabetes training improving very gradually  As evidenced by patient and spouse's questions and report and still needs reinforcement.    Intervention:  Nutrition education about  target blood sugars,  Interpreting values and using to make changes. Recommend 3x/day testing to assess Pm blood sugars better Coordination of care: If pattern continues lower in am and higher in PM like it appears to be today with A1C above goal- consider empagliflozin added to metformin. Eddie [name]) with CKD and CHF.   Teaching Method Utilized: Visual, Auditory, Hands on Handouts given during visit include: handout - staying off the roller coaster Barriers to learning/adherence to lifestyle change: material resources Demonstrated degree of understanding via: Teach Back   Monitoring/Evaluation:  Dietary intake, exercise, meter, and body weight in 1 month(s)  Eddie [name], RD 12/27/2017 1:15 PM. .

## 2017-12-27 NOTE — Patient Instructions (Signed)
Keep working on eating higher fat and protein foods instead of higher carb foods.   See sheet provided.  Consider adding a medication if you are getting frustrated trying to adjust your diet.   Please follow up with dentist so you can eat more of what you want.  Please follow up on same day you see Dr. Mikey Bussing.

## 2017-12-28 ENCOUNTER — Other Ambulatory Visit: Payer: Self-pay | Admitting: Dietician

## 2017-12-28 DIAGNOSIS — Z794 Long term (current) use of insulin: Principal | ICD-10-CM

## 2017-12-28 DIAGNOSIS — E1142 Type 2 diabetes mellitus with diabetic polyneuropathy: Secondary | ICD-10-CM

## 2017-12-28 NOTE — Progress Notes (Signed)
Ask front office to schedule him with all three of [place] in July.

## 2017-12-28 NOTE — Progress Notes (Signed)
Referral request

## 2017-12-28 NOTE — Progress Notes (Signed)
That would be great to schedule an appointment with Dr Selena Batten.

## 2017-12-28 NOTE — Progress Notes (Signed)
He may qualify for Medicare Extra Help. [name], can you have him scheduled with me before or after his appointment with you?

## 2018-01-04 DIAGNOSIS — H401133 Primary open-angle glaucoma, bilateral, severe stage: Secondary | ICD-10-CM | POA: Diagnosis not present

## 2018-01-04 MED FILL — ATORVASTATIN 80 MG TABLET: 80 | 30 days supply | Qty: 30 | Fill #8

## 2018-01-04 MED FILL — ENTRESTO 97 MG-103 MG TAB: 97-103 | 30 days supply | Qty: 60 | Fill #3

## 2018-01-04 MED FILL — CLOPIDOGREL 75 MG TABLET: 75 | 30 days supply | Qty: 30 | Fill #4

## 2018-01-04 MED FILL — FUROSEMIDE 20 MG TABS: 20 | 30 days supply | Qty: 60 | Fill #4

## 2018-01-04 MED FILL — SPIRONOLACTONE 25 MG TABLET: 25 | 30 days supply | Qty: 30 | Fill #6

## 2018-01-09 ENCOUNTER — Encounter (HOSPITAL_COMMUNITY): Payer: Self-pay | Admitting: Internal Medicine

## 2018-01-17 ENCOUNTER — Ambulatory Visit (HOSPITAL_COMMUNITY)
Admission: RE | Admit: 2018-01-17 | Discharge: 2018-01-17 | Disposition: A | Discharge status: Home / Self Care | Payer: Medicare Other | Source: Ambulatory Visit | Attending: Internal Medicine | Admitting: Internal Medicine

## 2018-01-17 VITALS — BP 110/64 | HR 87 | Wt 156.8 lb

## 2018-01-17 DIAGNOSIS — I110 Hypertensive heart disease with heart failure: Secondary | ICD-10-CM | POA: Insufficient documentation

## 2018-01-17 DIAGNOSIS — Z7982 Long term (current) use of aspirin: Secondary | ICD-10-CM | POA: Diagnosis not present

## 2018-01-17 DIAGNOSIS — I2510 Atherosclerotic heart disease of native coronary artery without angina pectoris: Secondary | ICD-10-CM | POA: Insufficient documentation

## 2018-01-17 DIAGNOSIS — I255 Ischemic cardiomyopathy: Secondary | ICD-10-CM | POA: Insufficient documentation

## 2018-01-17 DIAGNOSIS — Z794 Long term (current) use of insulin: Secondary | ICD-10-CM | POA: Diagnosis not present

## 2018-01-17 DIAGNOSIS — F17210 Nicotine dependence, cigarettes, uncomplicated: Secondary | ICD-10-CM | POA: Diagnosis not present

## 2018-01-17 DIAGNOSIS — Z7902 Long term (current) use of antithrombotics/antiplatelets: Secondary | ICD-10-CM | POA: Diagnosis not present

## 2018-01-17 DIAGNOSIS — Z79899 Other long term (current) drug therapy: Secondary | ICD-10-CM | POA: Insufficient documentation

## 2018-01-17 DIAGNOSIS — Z8673 Personal history of transient ischemic attack (TIA), and cerebral infarction without residual deficits: Secondary | ICD-10-CM | POA: Diagnosis not present

## 2018-01-17 DIAGNOSIS — I10 Essential (primary) hypertension: Secondary | ICD-10-CM | POA: Diagnosis not present

## 2018-01-17 DIAGNOSIS — I5022 Chronic systolic (congestive) heart failure: Secondary | ICD-10-CM | POA: Diagnosis not present

## 2018-01-17 DIAGNOSIS — I252 Old myocardial infarction: Secondary | ICD-10-CM | POA: Diagnosis not present

## 2018-01-17 DIAGNOSIS — Z9114 Patient's other noncompliance with medication regimen: Secondary | ICD-10-CM | POA: Insufficient documentation

## 2018-01-17 DIAGNOSIS — Z888 Allergy status to other drugs, medicaments and biological substances status: Secondary | ICD-10-CM | POA: Diagnosis not present

## 2018-01-17 DIAGNOSIS — E119 Type 2 diabetes mellitus without complications: Secondary | ICD-10-CM | POA: Insufficient documentation

## 2018-01-17 DIAGNOSIS — R42 Dizziness and giddiness: Secondary | ICD-10-CM | POA: Diagnosis not present

## 2018-01-17 DIAGNOSIS — R51 Headache: Secondary | ICD-10-CM | POA: Diagnosis not present

## 2018-01-17 DIAGNOSIS — G4733 Obstructive sleep apnea (adult) (pediatric): Secondary | ICD-10-CM | POA: Diagnosis not present

## 2018-01-17 DIAGNOSIS — Z955 Presence of coronary angioplasty implant and graft: Secondary | ICD-10-CM | POA: Diagnosis not present

## 2018-01-17 DIAGNOSIS — H5462 Unqualified visual loss, left eye, normal vision right eye: Secondary | ICD-10-CM | POA: Insufficient documentation

## 2018-01-17 DIAGNOSIS — Z833 Family history of diabetes mellitus: Secondary | ICD-10-CM | POA: Diagnosis not present

## 2018-01-17 DIAGNOSIS — Z8711 Personal history of peptic ulcer disease: Secondary | ICD-10-CM | POA: Diagnosis not present

## 2018-01-17 LAB — BASIC METABOLIC PANEL
ANION GAP: 11 (ref 5–15)
BUN: 19 mg/dL (ref 8–23)
CALCIUM: 9 mg/dL (ref 8.9–10.3)
CO2: 25 mmol/L (ref 22–32)
Chloride: 106 mmol/L (ref 98–111)
Creatinine, Ser: 1.1 mg/dL (ref 0.61–1.24)
GFR calc non Af Amer: >60 mL/min (ref >60)
Glucose, Bld: 147 mg/dL — ABNORMAL HIGH (ref 70–99)
POTASSIUM: 5.2 mmol/L — AB (ref 3.5–5.1)
Sodium: 142 mmol/L (ref 135–145)

## 2018-01-17 LAB — BRAIN NATRIURETIC PEPTIDE: B NATRIURETIC PEPTIDE 5: 60.6 pg/mL (ref 0.0–100.0)

## 2018-01-17 MED ORDER — AMLODIPINE BESYLATE 5 MG PO TABS: 5.0000 mg | ORAL_TABLET | Freq: Every day | ORAL | 3 refills | Status: DC

## 2018-01-17 MED ORDER — CARVEDILOL 25 MG PO TABS: 25.0000 mg | ORAL_TABLET | Freq: Two times a day (BID) | ORAL | 3 refills | Status: DC

## 2018-01-17 NOTE — Progress Notes (Signed)
Advanced Heart Failure Clinic Note    HPI:  Eddie [name] is a 67 y/o male with h/o DM2, CAD s/p NSTEMI with LAD stent in 3/17, CVA, chronic systolic HF with EF 29-92%.   Had multiple admissions in May and June 2017 in the setting of decompensated CHF and EColi bacteremia. Readmitted in June 2017 with acute respiratory failure requiring intubation in setting of severe HTN and ADHF. Treated with milrinone which was eventually weaned off.     He presents today for HF follow up. Doing very well. Continue to use CPAP every night. Walks with can for stability. Can go to store without problem. Gets SOB with going up a hill. No edema, orthopnea, PND. Taking all meds without problem. Weight stable. Smokes a few cigarettes per day. Taking lasix 29m bid    Echo 01/11/17: EF 40%.   RV ok.  ECHO 06/19/2016: EF 20-25%.Cannot exclude small  laminar apical thrombus. No mobile or protuberant thrombus is  seen. Echo 5/15 EF 15-20%   Past Medical History:  Diagnosis Date  . Blindness of left eye   . CAD (coronary artery disease)    a. STEMI 09/2015 w/ DES to Prox LAD  . CVA (cerebral infarction)    a. 09/2015: acute small right frontal lobe infarct  . Diabetes [place]   . Hypertension   . Ischemic cardiomyopathy   . OSA (obstructive sleep apnea) 07/06/2017   Mild OSA with AHI of 8.2/hr with hypoxemia as low as 76% now on CPAP at 11cm H2O.  . PUD (peptic ulcer disease)     Current Outpatient Medications  Medication Sig Dispense Refill  . ACCU-CHEK FASTCLIX LANCETS MISC Use to check blood sugar 3 times a day 100 each 2  . Acetaminophen (TYLENOL) 325 MG CAPS Take 2 tablets by mouth every 4 (four) hours as needed.    .Marland Kitchenalbuterol (PROVENTIL HFA;VENTOLIN HFA) 108 (90 Base) MCG/ACT inhaler Inhale 2 puffs into the lungs every 6 (six) hours as needed for wheezing or shortness of breath. 1 Inhaler 2  . amLODipine (NORVASC) 5 MG tablet TAKE 1 TABLET (5 MG TOTAL) BY MOUTH DAILY. 30 tablet 3  . aspirin EC 81  MG tablet Take 1 tablet (81 mg total) by mouth daily. 90 tablet 3  . atorvastatin (LIPITOR) 80 MG tablet TAKE 1 TABLET BY MOUTH ONCE DAILY AT 6 PM. 30 tablet 11  . bimatoprost (LUMIGAN) 0.03 % ophthalmic solution Place 1 drop into the right eye at bedtime.    . Blood Glucose Monitoring Suppl (ACCU-CHEK GUIDE) w/Device KIT 1 kit by Does not apply route 2 (two) times daily. 1 kit 0  . brimonidine (ALPHAGAN) 0.2 % ophthalmic solution Apply to eye.    . carvedilol (COREG) 25 MG tablet Take 1 tablet (25 mg total) by mouth 2 (two) times daily. 60 tablet 3  . clopidogrel (PLAVIX) 75 MG tablet TAKE 1 TABLET (75 MG TOTAL) BY MOUTH DAILY. 30 tablet 6  . ENTRESTO 97-103 MG TAKE 1 TABLET BY MOUTH 2 (TWO) TIMES DAILY. 60 tablet 3  . furosemide (LASIX) 20 MG tablet Take 1 tablet (20 mg total) by mouth daily. 30 tablet 6  . furosemide (LASIX) 20 MG tablet TAKE 1 TABLET (20 MG TOTAL) BY MOUTH TWICE A DAY 60 tablet 6  . glucose blood (ACCU-CHEK GUIDE) test strip Use to check blood sugar 3 times daily 100 each 12  . Insulin Glargine (LANTUS) 100 UNIT/ML Solostar Pen Inject 10 Units into the skin daily at  10 pm. 45 mL 1  . Insulin Pen Needle 31G X 5 MM MISC Use to inject insulin one time a day 100 each 5  . ipratropium-albuterol (DUONEB) 0.5-2.5 (3) MG/3ML SOLN Take 3 mLs by nebulization every 6 (six) hours as needed (shortness of breath). 90 mL 0  . metFORMIN (GLUCOPHAGE) 1000 MG tablet Take 1 tablet (1,000 mg total) by mouth 2 (two) times daily with a meal. 180 tablet 3  . nitroGLYCERIN (NITROSTAT) 0.4 MG SL tablet Use one pill every 5 minutes X 3 if needed for chest pain. Contact MD if pain does not improve 30 tablet 0  . spironolactone (ALDACTONE) 25 MG tablet Take 1 tablet (25 mg total) by mouth daily. 30 tablet 5   No current facility-administered medications for this encounter.     Allergies  Allergen Reactions  . Bidil [Isosorb Dinitrate-Hydralazine] Other (See Comments)    Patient became lightheaded  with 1 tablet tid.       Social History   Socioeconomic History  . Marital status: Married    Spouse name: Not on file  . Number of children: Not on file  . Years of education: Not on file  . Highest education level: Not on file  Occupational History  . Occupation: Electrical engineer: [employer]  Social Needs  . Financial resource strain: Not on file  . Food insecurity:    Worry: Not on file    Inability: Not on file  . Transportation needs:    Medical: Not on file    Non-medical: Not on file  Tobacco Use  . Smoking status: Current Some Day Smoker  . Smokeless tobacco: Current User  Substance and Sexual Activity  . Alcohol use: No    Alcohol/week: 0.0 oz  . Drug use: No  . Sexual activity: Not on file  Lifestyle  . Physical activity:    Days per week: Not on file    Minutes per session: Not on file  . Stress: Not on file  Relationships  . Social connections:    Talks on phone: Not on file    Gets together: Not on file    Attends religious service: Not on file    Active member of club or organization: Not on file    Attends meetings of clubs or organizations: Not on file    Relationship status: Not on file  . Intimate partner violence:    Fear of current or ex partner: Not on file    Emotionally abused: Not on file    Physically abused: Not on file    Forced sexual activity: Not on file  Other Topics Concern  . Not on file  Social History Narrative  . Not on file      Family History  Problem Relation Age of Onset  . Diabetes Mother   . Cancer Neg Hx     Vitals:   01/17/18 1155  BP: 110/64  Pulse: 87  SpO2: 100%  Weight: 156 lb 12.8 oz (71.1 kg)   Wt Readings from Last 3 Encounters:  01/17/18 156 lb 12.8 oz (71.1 kg)  12/27/17 157 lb 8 oz (71.4 kg)  11/29/17 157 lb 3.2 oz (71.3 kg)     PHYSICAL EXAM: General:  Well appearing. No resp difficulty HEENT: normal x for poor dentition  Neck: supple. no JVD. Carotids 2+ bilat; no  bruits. No lymphadenopathy or thryomegaly appreciated. Cor: PMI nondisplaced. Regular rate & rhythm. No rubs,  gallops or murmurs. Lungs: clear but decreased BS throughtout Abdomen: soft, nontender, nondistended. No hepatosplenomegaly. No bruits or masses. Good bowel sounds. Extremities: no cyanosis, clubbing, rash, edema Neuro: alert & orientedx3, cranial nerves grossly intact. moves all 4 extremities w/o difficulty. Affect pleasant   ASSESSMENT & PLAN: 1. Chronicsystolic CHF : ICM May 8110 EF 15-20% and 12/17 EF 25-30%. Echo 12/2016 EF improved 40% - Stable NYHA II-early III - Volume status looks good - Continue lasix 20 bid, Can take extra as needed  - Continue Entresto 97/103 mg BID - Continue Coreg to 25 mg BID - Continue Spiro 25 mg daily - Intolerant to Bidil in the past with dizziness and headaches.  - BMET today. Can decrease lasix if creatinine is up  - Will need repeat echo.   2. CAD: s/p DES to proximal LAD in 09/2015  - No s/s ischemia ischemia  - Continue ASA, Plavix and atorvastatin.   3. HTN - Blood pressure well controlled. Continue current regimen.  4. 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  - Continue ASA and Plavix.   4. DMII - Follows with [facility] Medicine [facility].  - Consider Jardiance  5. OSA - Feels much better with CPAP     6. Tobacco use - Encouraged complete cessation.    Glori Bickers, MD  1:02 PM

## 2018-01-17 NOTE — Patient Instructions (Signed)
Labs today (will call for abnormal results, otherwise no news is good news)  Echocardiogram and Follow up in 6 months.  Please call us in [place] to schedule your appointments.  [phone], Opt 3.

## 2018-01-17 NOTE — Addendum Note (Signed)
Encounter addended by: Georgina Peer, RN on: 01/17/2018 1:22 PM  Actions taken: Order list changed, Diagnosis association updated, Sign clinical note

## 2018-01-17 NOTE — Addendum Note (Signed)
Encounter addended by: Georgina Peer, RN on: 01/17/2018 1:10 PM  Actions taken: Pharmacy for encounter modified, Order list changed, Diagnosis association updated

## 2018-01-18 ENCOUNTER — Other Ambulatory Visit (HOSPITAL_COMMUNITY): Payer: Self-pay | Admitting: *Deleted

## 2018-01-18 MED ORDER — CARVEDILOL 25 MG PO TABS: 25.0000 mg | ORAL_TABLET | Freq: Two times a day (BID) | ORAL | 3 refills | Status: DC

## 2018-01-18 MED ORDER — AMLODIPINE BESYLATE 5 MG PO TABS: 5.0000 mg | ORAL_TABLET | Freq: Every day | ORAL | 3 refills | Status: DC

## 2018-02-05 ENCOUNTER — Other Ambulatory Visit (HOSPITAL_COMMUNITY): Payer: Self-pay | Admitting: Internal Medicine

## 2018-02-05 MED FILL — CLOPIDOGREL 75 MG TABLET: 75 | 30 days supply | Qty: 30 | Fill #5

## 2018-02-05 MED FILL — LANTUS SOLOSTAR 100 UNITS/M: 100 | 84 days supply | Qty: 9 | Fill #3

## 2018-02-05 MED FILL — ATORVASTATIN 80 MG TABLET: 80 | 30 days supply | Qty: 30 | Fill #9

## 2018-02-05 MED FILL — FUROSEMIDE 20 MG TABS: 20 | 30 days supply | Qty: 60 | Fill #5

## 2018-02-05 MED FILL — ENTRESTO 97 MG-103 MG TAB: 97-103 | 30 days supply | Qty: 60 | Fill #0

## 2018-02-05 MED FILL — SPIRONOLACTONE 25 MG TABLET: 25 | 30 days supply | Qty: 30 | Fill #0

## 2018-02-19 ENCOUNTER — Other Ambulatory Visit (HOSPITAL_COMMUNITY): Payer: Self-pay | Admitting: Cardiology

## 2018-02-19 MED FILL — ACCU-CHEK FASTCLIX LANCETS: 34 days supply | Qty: 102 | Fill #1

## 2018-02-19 MED FILL — ACCU-CHEK GUIDE STRP: 33 days supply | Qty: 100 | Fill #1

## 2018-02-19 MED FILL — AMLODIPINE BESYLATE 5 MG TA: 5 | 30 days supply | Qty: 30 | Fill #1

## 2018-02-20 MED FILL — AMOXICILLIN 500 MG CAPSULE: 500 | 7 days supply | Qty: 28 | Fill #0

## 2018-02-20 MED FILL — ACETAMINOPHEN/COD #3 TABLET: 300-30 | 3 days supply | Qty: 20 | Fill #0

## 2018-02-27 NOTE — Progress Notes (Signed)
Subjective:  HPI: [name] is a 67 y.o. male who presents for DM, HTN.  Please see Assessment and Plan below for the status of his chronic medical problems.  Review of Systems: Review of Systems  Constitutional: Negative for malaise/fatigue and weight loss.  Cardiovascular: Negative for palpitations, orthopnea and leg swelling.  Gastrointestinal: Negative for constipation and diarrhea.  Neurological: Positive for tingling. Negative for weakness.    Objective:  Physical Exam: Vitals:   02/28/18 0901  BP: (!) 145/78  Pulse: 86  Temp: 98.4 F (36.9 C)  TempSrc: Oral  SpO2: 100%  Weight: 156 lb 11.2 oz (71.1 kg)  Height: 5' 6" (1.676 m)   Physical Exam  Constitutional: He is well-developed, well-nourished, and in no distress.  HENT:  Head: Normocephalic and atraumatic.  Cardiovascular: Normal rate, regular rhythm and normal heart sounds.  Pulmonary/Chest: Effort normal. He has no wheezes. He has no rales.  Abdominal: Soft. Bowel sounds are normal. There is no tenderness.  Musculoskeletal: He exhibits no edema.  Neurological: He has normal strength.  Reflex Scores:      Tricep reflexes are 2+ on the right side and 2+ on the left side.      Bicep reflexes are 2+ on the right side and 2+ on the left side.      Brachioradialis reflexes are 2+ on the right side and 2+ on the left side. reproducible tingling with tapping ulnar grove  Nursing note and vitals reviewed.  Assessment & Plan:  Essential hypertension HPI: Blood pressure has been well controlled in recent visits he recently saw Dr. Bensimhon who increased his carvedilol to 25 mg twice daily.  However I went over medications with [name] and he did not have carvedilol apparently this was sent to the wrong pharmacy and she was not aware.  He otherwise has no complaints and is tolerating his medications well.  Assessment: Essential hypertension mildly above goal  Plan We will continue his current medical  regimen I have refilled carvedilol with instructions to take 25 mg twice daily.  He will also continue spironolactone 25 mg daily Entresto twice daily and amlodipine 5 mg daily  Chronic combined systolic and diastolic heart failure (HCC) HPI: He denies any orthopnea shortness of breath he feels that overall he is doing very well.  Assessment chronic combined congestive heart failure  Plan We will continue his current medical regimen Dr. Bensimhon did note the possibility of starting Jardiance I think this potentially would be a good idea except we will have to monitor closely for hyperkalemia  (given entresto and sprironolactone) and given his A1c is actually improved we will hold off on starting this given his potassium is 5.2 at last check.  Controlled type 2 diabetes mellitus with diabetic polyneuropathy, with long-term current use of insulin (HCC) HPI: He is currently taking Lantus 10 units daily along with metformin 1 g twice daily he reports no adverse effects and feels he is doing well he is checking his blood sugars usually about once a day sometimes twice a day review of his glucometer download shows most readings in the mid 100s with rare readings above 200.  He denies any polyuria polydipsia.  Assessment: I reviewed Dr Bensimohn's last note he mentioned about potentially starting Jardiance and a little hesitant to start this at this time given his A1c is now down to 6.1% and his last potassium was 5.2 I did recheck a be met to follow-up on this to make sure it is   not any higher.  I also checked microalbumin he currently does not have any significant microalbuminuria and we will hold off on Jardiance for now.  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  He has not noticed any aggravating or alleviating factors he has  not taken any medication for this.  He is not sure of the exact onset but it has been present for at least weeks.  He does also have some similar numbness and tingling in bilateral toes which we have previously attributed to his diabetes.  Assessment: Ulnar neuropathy/impingement  P: He sleeps on his right side I suspect he is having some impingement of his right ulnar nerve at the ulnar groove I have discussed trying to adjust sleeping position and he will try this he will also avoid resting on his right elbow if this is not effective we may try an elbow cushion     Medications Ordered Meds ordered this encounter  Medications  . carvedilol (COREG) 25 MG tablet    Sig: Take 1 tablet (25 mg total) by mouth 2 (two) times daily.    Dispense:  180 tablet    Refill:  3   Other Orders Orders Placed This Encounter  Procedures  . Fecal occult blood, imunochemical    Standing Status:   Future    Standing Expiration Date:   03/01/2019  . Pneumococcal polysaccharide vaccine 23-valent greater than or equal to 2yo subcutaneous/IM  . Microalbumin / Creatinine Urine Ratio  . BMP8+Anion Gap  . POC Hbg A1C   Follow Up: Return in about 3 months (around 05/31/2018).

## 2018-02-28 ENCOUNTER — Encounter: Payer: Self-pay | Admitting: Dietician

## 2018-02-28 ENCOUNTER — Ambulatory Visit (INDEPENDENT_AMBULATORY_CARE_PROVIDER_SITE_OTHER): Payer: Medicare Other | Admitting: Internal Medicine

## 2018-02-28 ENCOUNTER — Ambulatory Visit: Payer: Medicare Other | Admitting: Pharmacist

## 2018-02-28 ENCOUNTER — Encounter (INDEPENDENT_AMBULATORY_CARE_PROVIDER_SITE_OTHER): Payer: Self-pay

## 2018-02-28 ENCOUNTER — Encounter: Payer: Self-pay | Admitting: Internal Medicine

## 2018-02-28 ENCOUNTER — Ambulatory Visit (INDEPENDENT_AMBULATORY_CARE_PROVIDER_SITE_OTHER): Payer: Medicare Other | Admitting: Dietician

## 2018-02-28 VITALS — BP 145/78 | HR 86 | Temp 98.4°F | Ht 66.0 in | Wt 156.7 lb

## 2018-02-28 DIAGNOSIS — Z713 Dietary counseling and surveillance: Secondary | ICD-10-CM

## 2018-02-28 DIAGNOSIS — I10 Essential (primary) hypertension: Secondary | ICD-10-CM

## 2018-02-28 DIAGNOSIS — Z1211 Encounter for screening for malignant neoplasm of colon: Secondary | ICD-10-CM

## 2018-02-28 DIAGNOSIS — Z794 Long term (current) use of insulin: Principal | ICD-10-CM

## 2018-02-28 DIAGNOSIS — E1142 Type 2 diabetes mellitus with diabetic polyneuropathy: Secondary | ICD-10-CM | POA: Diagnosis not present

## 2018-02-28 DIAGNOSIS — Z23 Encounter for immunization: Secondary | ICD-10-CM

## 2018-02-28 DIAGNOSIS — I5042 Chronic combined systolic (congestive) and diastolic (congestive) heart failure: Secondary | ICD-10-CM

## 2018-02-28 DIAGNOSIS — I110 Hypertensive heart disease with heart failure: Secondary | ICD-10-CM

## 2018-02-28 DIAGNOSIS — G5621 Lesion of ulnar nerve, right upper limb: Secondary | ICD-10-CM

## 2018-02-28 DIAGNOSIS — Z79899 Other long term (current) drug therapy: Secondary | ICD-10-CM

## 2018-02-28 LAB — POCT GLYCOSYLATED HEMOGLOBIN (HGB A1C): Hemoglobin A1C: 6.1 % — AB (ref 4.0–5.6)

## 2018-02-28 LAB — GLUCOSE, CAPILLARY: GLUCOSE-CAPILLARY: 148 mg/dL — AB (ref 70–99)

## 2018-02-28 MED ORDER — CARVEDILOL 25 MG PO TABS: 25.0000 mg | ORAL_TABLET | Freq: Two times a day (BID) | ORAL | 3 refills | Status: DC

## 2018-02-28 MED FILL — CARVEDILOL 25 MG TABLET: 25 | 90 days supply | Qty: 180 | Fill #0

## 2018-02-28 NOTE — Patient Instructions (Signed)
I have sent the perscription for Coreg for 25mg  to your pharmacy.  Great work on the diabetes, keep it up!

## 2018-02-28 NOTE — Patient Instructions (Addendum)
Good Job on lowering your A1C!!!  Your goal for the next few months is to wear shoes most of the time,  even in the house.  We should check your feet every 3 months. Please take your shoes off when you come.   Please follow up around February 5-10th. I will try to call you with that appointment on a Wednesday Thursday or Friday mornings.     Diabetes and Foot Care Diabetes may cause you to have problems because of poor blood supply (circulation) to your feet and legs. This may cause the skin on your feet to become thinner, break easier, and heal more slowly. Your skin may become dry, and the skin may peel and crack. You may also have nerve damage in your legs and feet causing decreased feeling in them. You may not notice minor injuries to your feet that could lead to infections or more serious problems. Taking care of your feet is one of the most important things you can do for yourself. Follow these instructions at home:  Wear shoes at all times, even in the house. Do not go barefoot. Bare feet are easily injured.  Check your feet daily for blisters, cuts, and redness. If you cannot see the bottom of your feet, use a mirror or ask someone for help.  Wash your feet with warm water (do not use hot water) and mild soap. Then pat your feet and the areas between your toes until they are completely dry. Do not soak your feet as this can dry your skin.  Apply a moisturizing lotion or petroleum jelly (that does not contain alcohol and is unscented) to the skin on your feet and to dry, brittle toenails. Do not apply lotion between your toes.  Trim your toenails straight across. Do not dig under them or around the cuticle. File the edges of your nails with an emery board or nail file.  Do not cut corns or calluses or try to remove them with medicine.  Wear clean socks or stockings every day. Make sure they are not too tight. Do not wear knee-high stockings since they may decrease blood flow to your  legs.  Wear shoes that fit properly and have enough cushioning. To break in new shoes, wear them for just a few hours a day. This prevents you from injuring your feet. Always look in your shoes before you put them on to be sure there are no objects inside.  Do not cross your legs. This may decrease the blood flow to your feet.  If you find a minor scrape, cut, or break in the skin on your feet, keep it and the skin around it clean and dry. These areas may be cleansed with mild soap and water. Do not cleanse the area with peroxide, alcohol, or iodine.  When you remove an adhesive bandage, be sure not to damage the skin around it.  If you have a wound, look at it several times a day to make sure it is healing.  Do not use heating pads or hot water bottles. They may burn your skin. If you have lost feeling in your feet or legs, you may not know it is happening until it is too late.  Make sure your health care provider performs a complete foot exam at least annually or more often if you have foot problems. Report any cuts, sores, or bruises to your health care provider immediately. Contact a health care provider if:  You have an injury  that is not healing.  You have cuts or breaks in the skin.  You have an ingrown nail.  You notice redness on your legs or feet.  You feel burning or tingling in your legs or feet.  You have pain or cramps in your legs and feet.  Your legs or feet are numb.  Your feet always feel cold. Get help right away if:  There is increasing redness, swelling, or pain in or around a wound.  There is a red line that goes up your leg.  Pus is coming from a wound.  You develop a fever or as directed by your health care provider.  You notice a bad smell coming from an ulcer or wound.

## 2018-02-28 NOTE — Progress Notes (Signed)
  Medical Nutrition Therapy:  Appt start time: 1004 end time:  1025 Visit # 4 this year  Assessment:  Primary concerns today: diabetes education.   Eddie [name] is here with his wife today. His weight is about the same. He has no concerns. He got his teeth extracted last week. He is eating softer foods. He and wife verbalize understanding about the affect of high carb foods on his blood sugars. Eddie [name] continues to walk daily for at lleat 30 minutes. He checks his feet daily and reports no problems.  MEDICATIONS: lantus 10 units daily, metformin at max dose- they report no problems obtaining BLOOD SUGAR: Lab Results  Component Value Date   HGBA1C 6.1 (A) 02/28/2018   past 3 months: 1.2 test/day- average 148 decreased from 158- most in target, no lows.  Range is 89-257.  fasting mostly in target but trend is rising over the day. Most tests are premeal, so anticipate higher actual average/A1C Breakfast Lunch  Dinner   Ave 140 175  177 ANTHROPOMETRICS:Estimated body mass index is 25.29 kg/m as calculated from the following:   Height as of an earlier encounter on 02/28/18: 5\' 6"  (1.676 m).   Weight as of an earlier encounter on 02/28/18: 156 lb 11.2 oz (71.1 kg).   Wt Readings from Last 5 Encounters:  02/28/18 156 lb 11.2 oz (71.1 kg)  01/17/18 156 lb 12.8 oz (71.1 kg)  12/27/17 157 lb 8 oz (71.4 kg)  11/29/17 157 lb 3.2 oz (71.3 kg)  11/01/17 158 lb 12.8 oz (72 kg)   DIETARY INTAKE: Usual eating pattern includes 3 meals and 0-1 snacks per day. Dining Out (times/week): 7x/week.  He is eating and drinking less higher carb foods Breakfast- grits & eggs or french toast, coffee Lunch- soup  Dinner rice & salmon patty, green beans/ green peas  snacks- cheese, fruit, fruit smoothie Other Beverages: coffee, mostly water   Progress Towards Goal(s):  Resolved.   Nutritional Diagnosis:  NB-1.1 Food and nutrition-related knowledge deficit As related to lack of prior diabetes training improved and  resolved  As evidenced by patient and spouse's questions and report and lowered A1C.    Intervention:  Nutrition education about target blood sugars, A1C, Foot care for diabetes and smoking and foot care & importnace of wearing shoes all the time and checking feet daily and reporting any issues.  Coordination of care: If pattern continues lower in am and higher in PM like it appears to be today with A1C above goal- consider empagliflozin added to metformin. [name]) with CKD and CHF.   Teaching Method Utilized: Visual, Auditory, Hands on Handouts given during visit include: handout - staying off the roller coaster Barriers to learning/adherence to lifestyle change: material resources Demonstrated degree of understanding via: Teach Back   Monitoring/Evaluation:  Dietary intake, exercise, meter, and body weight in 6 month(s)  Per patient request. [name], RD 02/28/2018 10:23 AM. .

## 2018-02-28 NOTE — Progress Notes (Signed)
S: Eddie [name] is a 67 y.o. male reports to clinical pharmacist appointment for medication help  Allergies  Allergen Reactions  . Bidil [Isosorb Dinitrate-Hydralazine] Other (See Comments)    Patient became lightheaded with 1 tablet tid.    Medication Sig  ACCU-CHEK FASTCLIX LANCETS MISC Use to check blood sugar 3 times a day  Acetaminophen (TYLENOL) 325 MG CAPS Take 2 tablets by mouth every 4 (four) hours as needed.  albuterol (PROVENTIL HFA;VENTOLIN HFA) 108 (90 Base) MCG/ACT inhaler Inhale 2 puffs into the lungs every 6 (six) hours as needed for wheezing or shortness of breath.  amLODipine (NORVASC) 5 MG tablet Take 1 tablet (5 mg total) by mouth daily.  aspirin EC 81 MG tablet Take 1 tablet (81 mg total) by mouth daily.  atorvastatin (LIPITOR) 80 MG tablet TAKE 1 TABLET BY MOUTH ONCE DAILY AT 6 PM.  bimatoprost (LUMIGAN) 0.03 % ophthalmic solution Place 1 drop into the right eye at bedtime.  Blood Glucose Monitoring Suppl (ACCU-CHEK GUIDE) w/Device KIT 1 kit by Does not apply route 2 (two) times daily.  brimonidine (ALPHAGAN) 0.2 % ophthalmic solution Apply to eye.  carvedilol (COREG) 25 MG tablet Take 1 tablet (25 mg total) by mouth 2 (two) times daily.  clopidogrel (PLAVIX) 75 MG tablet TAKE 1 TABLET (75 MG TOTAL) BY MOUTH DAILY.  ENTRESTO 97-103 MG TAKE 1 TABLET BY MOUTH 2 TIMES DAILY.  furosemide (LASIX) 20 MG tablet Take 1 tablet (20 mg total) by mouth daily.  furosemide (LASIX) 20 MG tablet TAKE 1 TABLET (20 MG TOTAL) BY MOUTH TWICE A DAY  glucose blood (ACCU-CHEK GUIDE) test strip Use to check blood sugar 3 times daily  Insulin Glargine (LANTUS) 100 UNIT/ML Solostar Pen Inject 10 Units into the skin daily at 10 pm.  Insulin Pen Needle 31G X 5 MM MISC Use to inject insulin one time a day  ipratropium-albuterol (DUONEB) 0.5-2.5 (3) MG/3ML SOLN Take 3 mLs by nebulization every 6 (six) hours as needed (shortness of breath).  metFORMIN (GLUCOPHAGE) 1000 MG tablet Take 1 tablet  (1,000 mg total) by mouth 2 (two) times daily with a meal.  nitroGLYCERIN (NITROSTAT) 0.4 MG SL tablet Use one pill every 5 minutes X 3 if needed for chest pain. Contact MD if pain does not improve  spironolactone (ALDACTONE) 25 MG tablet Take 1 tablet (25 mg total) by mouth daily.  spironolactone (ALDACTONE) 25 MG tablet TAKE 1 TABLET (25 MG TOTAL) BY MOUTH DAILY.   Past Medical History:  Diagnosis Date  . Blindness of left eye   . CAD (coronary artery disease)    a. STEMI 09/2015 w/ DES to Prox LAD  . CVA (cerebral infarction)    a. 09/2015: acute small right frontal lobe infarct  . Diabetes ([place])   . Hypertension   . Ischemic cardiomyopathy   . OSA (obstructive sleep apnea) 07/06/2017   Mild OSA with AHI of 8.2/hr with hypoxemia as low as 76% now on CPAP at 11cm H2O.  . PUD (peptic ulcer disease)    Social History   Socioeconomic History  . Marital status: Married    Spouse name: Not on file  . Number of children: Not on file  . Years of education: Not on file  . Highest education level: Not on file  Occupational History  . Occupation: Electrical engineer: [employer]  Social Needs  . Financial resource strain: Not on file  . Food insecurity:  Worry: Not on file    Inability: Not on file  . Transportation needs:    Medical: Not on file    Non-medical: Not on file  Tobacco Use  . Smoking status: Current Some Day Smoker  . Smokeless tobacco: Current User  Substance and Sexual Activity  . Alcohol use: No    Alcohol/week: 0.0 standard drinks  . Drug use: No  . Sexual activity: Not on file  Lifestyle  . Physical activity:    Days per week: Not on file    Minutes per session: Not on file  . Stress: Not on file  Relationships  . Social connections:    Talks on phone: Not on file    Gets together: Not on file    Attends religious service: Not on file    Active member of club or organization: Not on file    Attends meetings of clubs or organizations:  Not on file    Relationship status: Not on file  Other Topics Concern  . Not on file  Social History Narrative  . Not on file   Family History  Problem Relation Age of Onset  . Diabetes Mother   . Cancer Neg Hx    O:    Component Value Date/Time   CHOL 136 10/05/2015 1522   HDL 19 (L) 10/05/2015 1522   TRIG 63 01/05/2016 1310   AST 43 (H) 01/05/2016 0935   ALT 23 01/05/2016 0935   NA 142 01/17/2018 1308   K 5.2 (H) 01/17/2018 1308   CL 106 01/17/2018 1308   CO2 25 01/17/2018 1308   GLUCOSE 147 (H) 01/17/2018 1308   HGBA1C 6.1 (A) 02/28/2018 0926   HGBA1C 9.6 (H) 12/11/2015 0720   BUN 19 01/17/2018 1308   CREATININE 1.10 01/17/2018 1308   CREATININE 1.38 (H) 12/08/2015 0947   CALCIUM 9.0 01/17/2018 1308   GFRNONAA >60 01/17/2018 1308   GFRAA >60 01/17/2018 1308   WBC 6.7 01/09/2016 0445   HGB 8.7 (L) 01/09/2016 0445   HCT 29.4 (L) 01/09/2016 0445   PLT 274 01/09/2016 0445   TSH 0.196 (L) 10/05/2015 1522   Ht Readings from Last 2 Encounters:  02/28/18 _0  (1.676 m)  07/12/17 _1  (1.676 m)   Wt Readings from Last 2 Encounters:  02/28/18 156 lb 11.2 oz (71.1 kg)  01/17/18 156 lb 12.8 oz (71.1 kg)   There is no height or weight on file to calculate BMI. BP Readings from Last 3 Encounters:  02/28/18 (!) 145/78  01/17/18 110/64  11/01/17 139/69   A/P: Patient was seen today in a co-visit between physician, pharmacist, and dietician. See documentation under Dr. Jodene Nam visit for details.  Patient has no medication-related concerns today, he was previously unable to afford his medications but was enrolled in the Medicare Extra Help Program. His A1C has improved. Provided pill box, pill cutter, and medication bag.  An after visit summary was provided and patient advised to follow up if any changes in condition or questions regarding medications arise.   The patient verbalized understanding of information provided by repeating back concepts discussed.

## 2018-03-01 DIAGNOSIS — Z1211 Encounter for screening for malignant neoplasm of colon: Secondary | ICD-10-CM | POA: Diagnosis not present

## 2018-03-01 LAB — BMP8+ANION GAP
Anion Gap: 17 mmol/L (ref 10.0–18.0)
BUN / CREAT RATIO: 11 (ref 10–24)
BUN: 13 mg/dL (ref 8–27)
CALCIUM: 9.5 mg/dL (ref 8.6–10.2)
CHLORIDE: 104 mmol/L (ref 96–106)
CO2: 24 mmol/L (ref 20–29)
CREATININE: 1.14 mg/dL (ref 0.76–1.27)
GFR calc Af Amer: 77 mL/min/{1.73_m2} (ref >59)
GFR calc non Af Amer: 67 mL/min/{1.73_m2} (ref >59)
GLUCOSE: 147 mg/dL — AB (ref 65–99)
Potassium: 5.1 mmol/L (ref 3.5–5.2)
Sodium: 145 mmol/L — ABNORMAL HIGH (ref 134–144)

## 2018-03-01 LAB — MICROALBUMIN / CREATININE URINE RATIO
CREATININE, UR: 36.2 mg/dL
Microalb/Creat Ratio: <8.3 mg/g creat (ref 0.0–30.0)
Microalbumin, Urine: <3 ug/mL

## 2018-03-03 DIAGNOSIS — G5621 Lesion of ulnar nerve, right upper limb: Secondary | ICD-10-CM | POA: Insufficient documentation

## 2018-03-03 NOTE — Assessment & Plan Note (Signed)
HPI: Blood pressure has been well controlled in recent visits he recently saw Dr. Gala Romney who increased his carvedilol to 25 mg twice daily.  However I went over medications with Mrs. Earlene Plater and he did not have carvedilol apparently this was sent to the wrong pharmacy and she was not aware.  He otherwise has no complaints and is tolerating his medications well.  Assessment: Essential hypertension mildly above goal  Plan We will continue his current medical regimen I have refilled carvedilol with instructions to take 25 mg twice daily.  He will also continue spironolactone 25 mg daily Entresto twice daily and amlodipine 5 mg daily

## 2018-03-03 NOTE — Assessment & Plan Note (Signed)
HPI: She reports numbness and tingling at his right fourth and fifth fingers it is not associated with any weakness he is not dropping items.  He has not noticed any aggravating or alleviating factors he has not taken any medication for this.  He is not sure of the exact onset but it has been present for at least weeks.  He does also have some similar numbness and tingling in bilateral toes which we have previously attributed to his diabetes.  Assessment: Ulnar neuropathy/impingement  P: He sleeps on his right side I suspect he is having some impingement of his right ulnar nerve at the ulnar groove I have discussed trying to adjust sleeping position and he will try this he will also avoid resting on his right elbow if this is not effective we may try an elbow cushion

## 2018-03-03 NOTE — Assessment & Plan Note (Addendum)
HPI: He is currently taking Lantus 10 units daily along with metformin 1 g twice daily he reports no adverse effects and feels he is doing well he is checking his blood sugars usually about once a day sometimes twice a day review of his glucometer download shows most readings in the mid 100s with rare readings above 200.  He denies any polyuria polydipsia.  Assessment: I reviewed Dr Bensimohn's last note he mentioned about potentially starting Jardiance and a little hesitant to start this at this time given his A1c is now down to 6.1% and his last potassium was 5.2 I did recheck a be met to follow-up on this to make sure it is not any higher.  I also checked microalbumin he currently does not have any significant microalbuminuria and we will hold off on Jardiance for now.  He will continue his current medications

## 2018-03-03 NOTE — Assessment & Plan Note (Signed)
HPI: He denies any orthopnea shortness of breath he feels that overall he is doing very well.  Assessment chronic combined congestive heart failure  Plan We will continue his current medical regimen Dr. Gala Romney did note the possibility of starting Jardiance I think this potentially would be a good idea except we will have to monitor closely for hyperkalemia  (given entresto and sprironolactone) and given his A1c is actually improved we will hold off on starting this given his potassium is 5.2 at last check.

## 2018-03-05 MED FILL — ENTRESTO 97 MG-103 MG TAB: 97-103 | 30 days supply | Qty: 60 | Fill #1

## 2018-03-05 MED FILL — CLOPIDOGREL 75 MG TABLET: 75 | 30 days supply | Qty: 30 | Fill #6

## 2018-03-05 MED FILL — SPIRONOLACTONE 25 MG TABLET: 25 | 30 days supply | Qty: 30 | Fill #1

## 2018-03-05 MED FILL — FUROSEMIDE 20 MG TABS: 20 | 30 days supply | Qty: 60 | Fill #6

## 2018-03-06 MED FILL — ATORVASTATIN 80 MG TABLET: 80 | 30 days supply | Qty: 30 | Fill #10

## 2018-03-07 ENCOUNTER — Encounter: Payer: Self-pay | Admitting: Internal Medicine

## 2018-03-07 DIAGNOSIS — H401133 Primary open-angle glaucoma, bilateral, severe stage: Secondary | ICD-10-CM | POA: Insufficient documentation

## 2018-03-07 DIAGNOSIS — H269 Unspecified cataract: Secondary | ICD-10-CM | POA: Insufficient documentation

## 2018-03-08 ENCOUNTER — Other Ambulatory Visit: Payer: Medicare Other

## 2018-03-08 DIAGNOSIS — Z1211 Encounter for screening for malignant neoplasm of colon: Secondary | ICD-10-CM

## 2018-03-12 LAB — FECAL OCCULT BLOOD, IMMUNOCHEMICAL: Fecal Occult Bld: NEGATIVE

## 2018-03-20 MED FILL — metFORMIN HCL 1000 MG TABS: 1000 | 90 days supply | Qty: 180 | Fill #1

## 2018-03-20 MED FILL — AMLODIPINE BESYLATE 5 MG TA: 5 | 30 days supply | Qty: 30 | Fill #2

## 2018-04-15 ENCOUNTER — Other Ambulatory Visit (HOSPITAL_COMMUNITY): Payer: Self-pay | Admitting: Internal Medicine

## 2018-04-15 MED FILL — LANTUS SOLOSTAR 100 UNITS/M: 100 | 84 days supply | Qty: 9 | Fill #4

## 2018-04-15 MED FILL — ENTRESTO 97 MG-103 MG TAB: 97-103 | 30 days supply | Qty: 60 | Fill #2

## 2018-04-15 MED FILL — FUROSEMIDE 20 MG TABS: 20 | 30 days supply | Qty: 60 | Fill #0

## 2018-04-15 MED FILL — SPIRONOLACTONE 25 MG TABLET: 25 | 30 days supply | Qty: 30 | Fill #2

## 2018-04-15 MED FILL — ATORVASTATIN 80 MG TABLET: 80 | 30 days supply | Qty: 30 | Fill #11

## 2018-04-15 MED FILL — AMLODIPINE BESYLATE 5 MG TA: 5 | 30 days supply | Qty: 30 | Fill #3

## 2018-04-15 MED FILL — CLOPIDOGREL 75 MG TABLET: 75 | 30 days supply | Qty: 30 | Fill #0

## 2018-04-16 ENCOUNTER — Other Ambulatory Visit: Payer: Self-pay | Admitting: *Deleted

## 2018-04-16 DIAGNOSIS — Z794 Long term (current) use of insulin: Principal | ICD-10-CM

## 2018-04-16 DIAGNOSIS — E1140 Type 2 diabetes mellitus with diabetic neuropathy, unspecified: Secondary | ICD-10-CM

## 2018-04-16 DIAGNOSIS — IMO0002 Reserved for concepts with insufficient information to code with codable children: Secondary | ICD-10-CM

## 2018-04-16 DIAGNOSIS — E1165 Type 2 diabetes mellitus with hyperglycemia: Principal | ICD-10-CM

## 2018-04-17 MED ORDER — INSULIN PEN NEEDLE 31G X 5 MM MISC: 5 refills | Status: DC

## 2018-04-17 MED FILL — UNIFINE PENTIPS 31GX3/16: 31G X 5 MM | 90 days supply | Qty: 100 | Fill #0

## 2018-04-17 MED FILL — UNIFINE PENTIPS 31GX3/16": 31G X 5 MM | 90 days supply | Qty: 100 | Fill #0

## 2018-05-01 ENCOUNTER — Other Ambulatory Visit: Payer: Self-pay | Admitting: Pharmacist

## 2018-05-01 ENCOUNTER — Ambulatory Visit (INDEPENDENT_AMBULATORY_CARE_PROVIDER_SITE_OTHER): Payer: Medicare Other | Admitting: Pharmacist

## 2018-05-01 DIAGNOSIS — E1142 Type 2 diabetes mellitus with diabetic polyneuropathy: Secondary | ICD-10-CM | POA: Diagnosis not present

## 2018-05-01 DIAGNOSIS — Z794 Long term (current) use of insulin: Secondary | ICD-10-CM | POA: Diagnosis not present

## 2018-05-01 DIAGNOSIS — Z23 Encounter for immunization: Secondary | ICD-10-CM | POA: Diagnosis not present

## 2018-05-01 NOTE — Patient Instructions (Signed)
Please record the time, amount and what food drinks and activities you have while wearing the continuous glucose monitor(CGM) in the folder provided.  Bring the folder with you to follow up appointments  Do not have a CT or an MRI while wearing the CGM.   Please make an appointment for 1 week with me and a doctor for the first of two CGM downloads..   You will also return in 2 weeks to have your second download and the CGM removed.  

## 2018-05-01 NOTE — Progress Notes (Signed)
S: Eddie [name] is a 67 y.o. male reports to clinical pharmacist appointment for [facility] placement. Patient did  bring medication bottles. Patient is accompanied by family, who assist at home with medication management.  Allergies  Allergen Reactions  . Bidil [Isosorb Dinitrate-Hydralazine] Other (See Comments)    Patient became lightheaded with 1 tablet tid.    Prior to Admission medications   Medication Sig Start Date End Date Taking? Authorizing Provider  ACCU-CHEK FASTCLIX LANCETS MISC Use to check blood sugar 3 times a day 11/29/17  Yes Lucious Groves, DO  Acetaminophen (TYLENOL) 325 MG CAPS Take 2 tablets by mouth every 4 (four) hours as needed.   Yes [provider]  albuterol (PROVENTIL HFA;VENTOLIN HFA) 108 (90 Base) MCG/ACT inhaler Inhale 2 puffs into the lungs every 6 (six) hours as needed for wheezing or shortness of breath. 12/07/16  Yes Lucious Groves, DO  amLODipine (NORVASC) 5 MG tablet Take 1 tablet (5 mg total) by mouth daily. 01/18/18  Yes Bensimhon, Shaune Pascal, MD  aspirin EC 81 MG tablet Take 1 tablet (81 mg total) by mouth daily. 04/17/17  Yes Bensimhon, Shaune Pascal, MD  atorvastatin (LIPITOR) 80 MG tablet TAKE 1 TABLET BY MOUTH ONCE DAILY AT 6 PM. 04/18/17  Yes Bensimhon, Shaune Pascal, MD  bimatoprost (LUMIGAN) 0.03 % ophthalmic solution Place 1 drop into the right eye at bedtime.   Yes [provider]  Blood Glucose Monitoring Suppl (ACCU-CHEK GUIDE) w/Device KIT 1 kit by Does not apply route 2 (two) times daily. 10/16/17  Yes Lucious Groves, DO  brimonidine (ALPHAGAN) 0.2 % ophthalmic solution Apply to eye. 06/14/12  Yes [provider]  carvedilol (COREG) 25 MG tablet Take 1 tablet (25 mg total) by mouth 2 (two) times daily. 02/28/18  Yes Lucious Groves, DO  clopidogrel (PLAVIX) 75 MG tablet TAKE 1 TABLET BY MOUTH DAILY. 04/15/18  Yes Bensimhon, Shaune Pascal, MD  ENTRESTO 97-103 MG TAKE 1 TABLET BY MOUTH 2 TIMES DAILY. 02/05/18  Yes Bensimhon, Shaune Pascal, MD   furosemide (LASIX) 20 MG tablet TAKE 1 TABLET BY MOUTH TWICE A DAY 04/15/18  Yes Bensimhon, Shaune Pascal, MD  glucose blood (ACCU-CHEK GUIDE) test strip Use to check blood sugar 3 times daily 11/29/17  Yes Lucious Groves, DO  Insulin Glargine (LANTUS) 100 UNIT/ML Solostar Pen Inject 10 Units into the skin daily at 10 pm. 04/30/17  Yes Lucious Groves, DO  Insulin Pen Needle 31G X 5 MM MISC Use to inject insulin one time a day 04/17/18  Yes Lucious Groves, DO  metFORMIN (GLUCOPHAGE) 1000 MG tablet Take 1 tablet (1,000 mg total) by mouth 2 (two) times daily with a meal. 12/25/17 12/25/18 Yes Lucious Groves, DO  nitroGLYCERIN (NITROSTAT) 0.4 MG SL tablet Use one pill every 5 minutes X 3 if needed for chest pain. Contact MD if pain does not improve 10/26/15  Yes Love, Ivan Anchors, PA-C  spironolactone (ALDACTONE) 25 MG tablet TAKE 1 TABLET (25 MG TOTAL) BY MOUTH DAILY. 02/05/18  Yes Bensimhon, Shaune Pascal, MD    A/P: A drug regimen assessment was performed, including review of allergies, interactions, disease-state management, dosing and immunization history. Medications were reviewed with the patient, including name, instructions, indication, goals of therapy, potential side effects, importance of adherence, and safe use.  Findings/Recommendations: [name] had lost his albuterol inhaler for the past year and just found it today; has felt that he needs to use it about twice per week.  Old inhaler was expiring and he wanted a new one; dispensed a Proventil sample. Showed [name] how to prime the inhaler and how to correctly use the inhaler, as well as reiterating when to use it. [name] was also concerned that his Nitrostat may be expiring soon and was looking for guidance on what to do. I confirmed it would be expiring next month and talked to them about refilling the medication before then. [name] keeps the Nitrostat in him in case he needs it, but has not had to use it.    [name] verbalized understanding of  information provided by repeating back concepts discussed.   30 minutes spent face-to-face with the patient during the encounter. 70% of time spent on education. 30% of time was spent on CMG placement and flu shot administration.

## 2018-05-01 NOTE — Progress Notes (Signed)
Documentation for Freestyle Libre Pro Continuous glucose monitoring Freestyle Libre Pro CGM sensor placed and started on Eddie [name] who was identified by name and date of birth.  Patient was educated about wearing sensor, keeping food, activity and medication log and when to call office.She was educated about how to care for the sensor and not to have an MRI, CT or Diathermy while wearing the sensor. Follow up was arranged with the patient for 05/08/2018  Lot #:[id number] Serial #:[id number] Expiration Date:10/22/2018 Myrtha Mantis, Student-PharmD 05/01/2018 1:09 PM.

## 2018-05-08 ENCOUNTER — Encounter: Payer: Self-pay | Admitting: Internal Medicine

## 2018-05-08 ENCOUNTER — Ambulatory Visit (INDEPENDENT_AMBULATORY_CARE_PROVIDER_SITE_OTHER): Payer: Medicare Other | Admitting: Internal Medicine

## 2018-05-08 ENCOUNTER — Other Ambulatory Visit: Payer: Self-pay

## 2018-05-08 ENCOUNTER — Ambulatory Visit: Payer: Medicare Other | Admitting: Pharmacist

## 2018-05-08 VITALS — BP 134/92 | HR 80 | Temp 99.2°F | Ht 66.0 in | Wt 157.0 lb

## 2018-05-08 DIAGNOSIS — F4320 Adjustment disorder, unspecified: Secondary | ICD-10-CM

## 2018-05-08 DIAGNOSIS — E1142 Type 2 diabetes mellitus with diabetic polyneuropathy: Secondary | ICD-10-CM

## 2018-05-08 DIAGNOSIS — I2510 Atherosclerotic heart disease of native coronary artery without angina pectoris: Secondary | ICD-10-CM | POA: Diagnosis not present

## 2018-05-08 DIAGNOSIS — R05 Cough: Secondary | ICD-10-CM | POA: Diagnosis not present

## 2018-05-08 DIAGNOSIS — I5020 Unspecified systolic (congestive) heart failure: Secondary | ICD-10-CM

## 2018-05-08 DIAGNOSIS — Z955 Presence of coronary angioplasty implant and graft: Secondary | ICD-10-CM

## 2018-05-08 DIAGNOSIS — Z794 Long term (current) use of insulin: Principal | ICD-10-CM

## 2018-05-08 DIAGNOSIS — Z8673 Personal history of transient ischemic attack (TIA), and cerebral infarction without residual deficits: Secondary | ICD-10-CM

## 2018-05-08 MED ORDER — LIRAGLUTIDE 18 MG/3ML ~~LOC~~ SOPN: 0.6000 mg | PEN_INJECTOR | Freq: Every day | SUBCUTANEOUS | 0 refills | Status: DC

## 2018-05-08 MED FILL — VICTOZA 2-PAK 18 MG/3 ML PE: 18 | 60 days supply | Qty: 6 | Fill #0

## 2018-05-08 NOTE — Progress Notes (Signed)
   CC: DM  HPI:  [name] is a 67 y.o. gentleman with PMHx of type II DM, CAD s/p LAD stent in 2017, CVA, HFrEF with EF 40-45% presents for follow-up on Diabetes and first download of his CGM data. He wore the CGM for 7 days. The average reading was  154. % time in target was 73, % time below target was 0, and % time above target was 27. His blood glucose is predominantly elevated between 11 and 2. Intervention will be to stop Lantus 10 units qhs and start Victoza 0.6 mg daily. He was encouraged to continue with food log and limit the amount of sweets he is eating. The patient will be scheduled to see Dr. Selena Batten and [facility] visit for a final appointment.    Past Medical History:  Diagnosis Date  . Blindness of left eye   . CAD (coronary artery disease)    a. STEMI 09/2015 w/ DES to Prox LAD  . CVA (cerebral infarction)    a. 09/2015: acute small right frontal lobe infarct  . Diabetes ([facility])   . Hypertension   . Ischemic cardiomyopathy   . OSA (obstructive sleep apnea) 07/06/2017   Mild OSA with AHI of 8.2/hr with hypoxemia as low as 76% now on CPAP at 11cm H2O.  . PUD (peptic ulcer disease)    Review of Systems:   Constitutional: negative for fevers, chills, weight changes CV: negative for chest pain, palpitations, PND Resp: negative for cough GI: negative for abdominal pain, n/v/d Ext: negative for swelling   Physical Exam:  Vitals:   05/08/18 1334  BP: (!) 134/92  Pulse: 80  Temp: 99.2 F (37.3 C)  TempSrc: Oral  SpO2: 100%  Weight: 157 lb (71.2 kg)  Height: 5\' 6"  (1.676 m)   General: alert, pleasant gentleman, appears stated age, NAD CV: RRR; no murmurs, rubs or gallops Pulm: normal respiratory effort; lungs CTA bilaterally  Ext: no edema Psych: appropriate mood and affect   Assessment & Plan:   See Encounters Tab for problem based charting.  Patient seen with Dr. Oswaldo Done

## 2018-05-08 NOTE — Progress Notes (Addendum)
Patient was seen today in a co-visit with Dr. Chesley Mires.   See documentation under Dr. Murrell Redden visit for details.  Freestyle [employer] and Reviewed  Week #1 of monitoring  8-day BG average 154, within target range 73% of the time  Above 180 mg/dL 12% of the time, highest around lunch and bedtime  Below 70 mg/dL 0% of the time  Although results did not indicate episodes of hypoglycemia, discussed with Dr. Chesley Mires optimal therapy would be to add a GLP-1 agonist due to PMH of heart failure, CAD, CKD, and history of CVA. Interchanged Lantus to Victoza. No contraindications or cautions to Victoza identified. Follow up with patient in 1 week.

## 2018-05-08 NOTE — Patient Instructions (Addendum)
Mr. Cantey, It was a pleasure meeting you!   Today we discussed: 1. Diabetes: your blood sugars are looking great! We are stopping your Lantus and starting a new medication, Victoza, to take every day. Continue taking your Metformin as prescribed.  Please return in 7 days with [facility] & Dr Selena Batten for [facility] #2.   2. Difficult adjustment period: What you are feeling is normal for someone that has worked all of their life. I would encourage you to try to find some hobbies and activities that you enjoy to fill your day.   3. For your difficulty with coughing up mucous: Please start taking Mucinex that you can buy over the counter to see if this helps.

## 2018-05-08 NOTE — Progress Notes (Addendum)
S: Eddie [name] is a 67 y.o. male reports to clinical pharmacist appointment for diabetes management.  Allergies  Allergen Reactions  . Bidil [Isosorb Dinitrate-Hydralazine] Other (See Comments)    Patient became lightheaded with 1 tablet tid.    Medication Sig  ACCU-CHEK FASTCLIX LANCETS MISC Use to check blood sugar 3 times a day  Acetaminophen (TYLENOL) 325 MG CAPS Take 2 tablets by mouth every 4 (four) hours as needed.  albuterol (PROVENTIL HFA;VENTOLIN HFA) 108 (90 Base) MCG/ACT inhaler Inhale 2 puffs into the lungs every 6 (six) hours as needed for wheezing or shortness of breath.  amLODipine (NORVASC) 5 MG tablet Take 1 tablet (5 mg total) by mouth daily.  aspirin EC 81 MG tablet Take 1 tablet (81 mg total) by mouth daily.  atorvastatin (LIPITOR) 80 MG tablet TAKE 1 TABLET BY MOUTH ONCE DAILY AT 6 PM.  bimatoprost (LUMIGAN) 0.03 % ophthalmic solution Place 1 drop into the right eye at bedtime.  Blood Glucose Monitoring Suppl (ACCU-CHEK GUIDE) w/Device KIT 1 kit by Does not apply route 2 (two) times daily.  brimonidine (ALPHAGAN) 0.2 % ophthalmic solution Apply to eye.  carvedilol (COREG) 25 MG tablet Take 1 tablet (25 mg total) by mouth 2 (two) times daily.  clopidogrel (PLAVIX) 75 MG tablet TAKE 1 TABLET BY MOUTH DAILY.  ENTRESTO 97-103 MG TAKE 1 TABLET BY MOUTH 2 TIMES DAILY.  furosemide (LASIX) 20 MG tablet TAKE 1 TABLET BY MOUTH TWICE A DAY  glucose blood (ACCU-CHEK GUIDE) test strip Use to check blood sugar 3 times daily  Insulin Pen Needle 31G X 5 MM MISC Use to inject insulin one time a day  liraglutide (VICTOZA) 18 MG/3ML SOPN Inject 0.1 mLs (0.6 mg total) into the skin daily.  metFORMIN (GLUCOPHAGE) 1000 MG tablet Take 1 tablet (1,000 mg total) by mouth 2 (two) times daily with a meal.  nitroGLYCERIN (NITROSTAT) 0.4 MG SL tablet Use one pill every 5 minutes X 3 if needed for chest pain. Contact MD if pain does not improve  spironolactone (ALDACTONE) 25 MG tablet TAKE 1  TABLET (25 MG TOTAL) BY MOUTH DAILY.   Past Medical History:  Diagnosis Date  . Blindness of left eye   . CAD (coronary artery disease)    a. STEMI 09/2015 w/ DES to Prox LAD  . CVA (cerebral infarction)    a. 09/2015: acute small right frontal lobe infarct  . Diabetes ([place])   . Hypertension   . Ischemic cardiomyopathy   . OSA (obstructive sleep apnea) 07/06/2017   Mild OSA with AHI of 8.2/hr with hypoxemia as low as 76% now on CPAP at 11cm H2O.  . PUD (peptic ulcer disease)    Social History   Socioeconomic History  . Marital status: Married    Spouse name: Not on file  . Number of children: Not on file  . Years of education: Not on file  . Highest education level: Not on file  Occupational History  . Occupation: Electrical engineer: [employer]  Social Needs  . Financial resource strain: Not on file  . Food insecurity:    Worry: Not on file    Inability: Not on file  . Transportation needs:    Medical: Not on file    Non-medical: Not on file  Tobacco Use  . Smoking status: Current Some Day Smoker  . Smokeless tobacco: Current User  Substance and Sexual Activity  . Alcohol use: No  Alcohol/week: 0.0 standard drinks  . Drug use: No  . Sexual activity: Not on file  Lifestyle  . Physical activity:    Days per week: Not on file    Minutes per session: Not on file  . Stress: Not on file  Relationships  . Social connections:    Talks on phone: Not on file    Gets together: Not on file    Attends religious service: Not on file    Active member of club or organization: Not on file    Attends meetings of clubs or organizations: Not on file    Relationship status: Not on file  Other Topics Concern  . Not on file  Social History Narrative  . Not on file   Family History  Problem Relation Age of Onset  . Diabetes Mother   . Cancer Neg Hx    O:    Component Value Date/Time   CHOL 136 10/05/2015 1522   HDL 19 (L) 10/05/2015 1522   TRIG 63  01/05/2016 1310   AST 43 (H) 01/05/2016 0935   ALT 23 01/05/2016 0935   NA 145 (H) 02/28/2018 0910   K 5.1 02/28/2018 0910   CL 104 02/28/2018 0910   CO2 24 02/28/2018 0910   GLUCOSE 147 (H) 02/28/2018 0910   GLUCOSE 147 (H) 01/17/2018 1308   HGBA1C 6.1 (A) 02/28/2018 0926   HGBA1C 9.6 (H) 12/11/2015 0720   BUN 13 02/28/2018 0910   CREATININE 1.14 02/28/2018 0910   CREATININE 1.38 (H) 12/08/2015 0947   CALCIUM 9.5 02/28/2018 0910   GFRNONAA 67 02/28/2018 0910   GFRAA 77 02/28/2018 0910   WBC 6.7 01/09/2016 0445   HGB 8.7 (L) 01/09/2016 0445   HCT 29.4 (L) 01/09/2016 0445   PLT 274 01/09/2016 0445   TSH 0.196 (L) 10/05/2015 1522   Ht Readings from Last 2 Encounters:  05/08/18 '5\' 6"'$  (1.676 m)  02/28/18 '5\' 6"'$  (1.676 m)   Wt Readings from Last 2 Encounters:  05/08/18 157 lb (71.2 kg)  02/28/18 156 lb 11.2 oz (71.1 kg)   There is no height or weight on file to calculate BMI. BP Readings from Last 3 Encounters:  05/08/18 (!) 134/92  02/28/18 (!) 145/78  01/17/18 110/64   A/P: Patient was seen today in a co-visit with Dr. Koleen Distance.   See documentation under Dr. Janne Napoleon visit for details.  Freestyle [employer] and Reviewed  Week #1 of monitoring  8-day BG average 154, within target range 73% of the time  Above 180 mg/dL 27% of the time, highest around lunch and bedtime  Below 70 mg/dL 0% of the time  Although results did not indicate episodes of hypoglycemia, discussed with Dr. Koleen Distance optimal therapy would be to add a GLP-1 agonist due to PMH of heart failure, CAD, CKD, and history of CVA. Interchanged Lantus to Victoza. No contraindications or cautions to Victoza identified. Follow up with patient in 1 week.

## 2018-05-09 ENCOUNTER — Encounter: Payer: Self-pay | Admitting: Internal Medicine

## 2018-05-09 NOTE — Assessment & Plan Note (Signed)
Patient presents for week 1 of CGM download. Demonstrates fairly good blood glucose control with predominantly mid-day peaks. No hypoglycemic events. Plan is to discontinue Lantus 10 units at bedtime and start Victoza 0.6 mg daily for added cardiovascular benefits. Continue Metformin 1000 mg bid. Will return for second CGM download in 1 week.

## 2018-05-09 NOTE — Progress Notes (Signed)
Internal Medicine Clinic Attending  I saw and evaluated the patient.  I personally confirmed the key portions of the history and exam documented by Dr. Bloomfield and I reviewed pertinent patient test results.  The assessment, diagnosis, and plan were formulated together and I agree with the documentation in the resident's note.  

## 2018-05-09 NOTE — Assessment & Plan Note (Signed)
Patient given prescription for Shingles vaccine today.

## 2018-05-15 ENCOUNTER — Other Ambulatory Visit: Payer: Self-pay

## 2018-05-15 ENCOUNTER — Ambulatory Visit: Payer: Medicare Other | Admitting: Pharmacist

## 2018-05-15 ENCOUNTER — Ambulatory Visit (INDEPENDENT_AMBULATORY_CARE_PROVIDER_SITE_OTHER): Payer: Medicare Other | Admitting: Internal Medicine

## 2018-05-15 ENCOUNTER — Other Ambulatory Visit (HOSPITAL_COMMUNITY): Payer: Self-pay | Admitting: Internal Medicine

## 2018-05-15 VITALS — BP 118/79 | HR 102 | Temp 98.7°F | Ht 66.0 in | Wt 156.1 lb

## 2018-05-15 DIAGNOSIS — Z794 Long term (current) use of insulin: Secondary | ICD-10-CM

## 2018-05-15 DIAGNOSIS — I2510 Atherosclerotic heart disease of native coronary artery without angina pectoris: Secondary | ICD-10-CM

## 2018-05-15 DIAGNOSIS — E1142 Type 2 diabetes mellitus with diabetic polyneuropathy: Secondary | ICD-10-CM

## 2018-05-15 DIAGNOSIS — K08109 Complete loss of teeth, unspecified cause, unspecified class: Secondary | ICD-10-CM | POA: Diagnosis not present

## 2018-05-15 DIAGNOSIS — I2102 ST elevation (STEMI) myocardial infarction involving left anterior descending coronary artery: Secondary | ICD-10-CM

## 2018-05-15 MED ORDER — ATORVASTATIN CALCIUM 80 MG PO TABS: ORAL_TABLET | ORAL | 5 refills | Status: DC

## 2018-05-15 MED FILL — SPIRONOLACTONE 25 MG TABLET: 25 | 30 days supply | Qty: 30 | Fill #3

## 2018-05-15 MED FILL — ATORVASTATIN 80 MG TABLET: 80 | 30 days supply | Qty: 30 | Fill #0

## 2018-05-15 MED FILL — FUROSEMIDE 20 MG TABS: 20 | 30 days supply | Qty: 60 | Fill #1

## 2018-05-15 MED FILL — ENTRESTO 97 MG-103 MG TAB: 97-103 | 30 days supply | Qty: 60 | Fill #3

## 2018-05-15 MED FILL — AMLODIPINE BESYLATE 5 MG TA: 5 | 30 days supply | Qty: 30 | Fill #0

## 2018-05-15 MED FILL — CLOPIDOGREL 75 MG TABLET: 75 | 30 days supply | Qty: 30 | Fill #1

## 2018-05-15 NOTE — Patient Instructions (Signed)
1.Continue taking diabetes medication as instructed by your PCP.  2. Your next appointment is scheduled for 05/30/18.

## 2018-05-15 NOTE — Progress Notes (Addendum)
S: Eddie [name] is a 67 y.o. male reports to clinical pharmacist appointment for CGM download #2. Marland Kitchen Patient did bring medication bottles. Patient is accompanied by his wife, who assists at home with medication management.  Allergies  Allergen Reactions  . Bidil [Isosorb Dinitrate-Hydralazine] Other (See Comments)    Patient became lightheaded with 1 tablet tid.    Medication Sig  ACCU-CHEK FASTCLIX LANCETS MISC Use to check blood sugar 3 times a day  Acetaminophen (TYLENOL) 325 MG CAPS Take 2 tablets by mouth every 4 (four) hours as needed.  albuterol (PROVENTIL HFA;VENTOLIN HFA) 108 (90 Base) MCG/ACT inhaler Inhale 2 puffs into the lungs every 6 (six) hours as needed for wheezing or shortness of breath.  amLODipine (NORVASC) 5 MG tablet Take 1 tablet (5 mg total) by mouth daily.  amLODipine (NORVASC) 5 MG tablet TAKE 1 TABLET (5 MG TOTAL) BY MOUTH DAILY.  aspirin EC 81 MG tablet Take 1 tablet (81 mg total) by mouth daily.  atorvastatin (LIPITOR) 80 MG tablet TAKE 1 TABLET BY MOUTH ONCE DAILY AT 6 PM.  bimatoprost (LUMIGAN) 0.03 % ophthalmic solution Place 1 drop into the right eye at bedtime.  Blood Glucose Monitoring Suppl (ACCU-CHEK GUIDE) w/Device KIT 1 kit by Does not apply route 2 (two) times daily.  brimonidine (ALPHAGAN) 0.2 % ophthalmic solution Apply to eye.  carvedilol (COREG) 25 MG tablet Take 1 tablet (25 mg total) by mouth 2 (two) times daily.  clopidogrel (PLAVIX) 75 MG tablet TAKE 1 TABLET BY MOUTH DAILY.  ENTRESTO 97-103 MG TAKE 1 TABLET BY MOUTH 2 TIMES DAILY.  furosemide (LASIX) 20 MG tablet TAKE 1 TABLET BY MOUTH TWICE A DAY  glucose blood (ACCU-CHEK GUIDE) test strip Use to check blood sugar 3 times daily  Insulin Pen Needle 31G X 5 MM MISC Use to inject insulin one time a day  liraglutide (VICTOZA) 18 MG/3ML SOPN Inject 0.1 mLs (0.6 mg total) into the skin daily.  metFORMIN (GLUCOPHAGE) 1000 MG tablet Take 1 tablet (1,000 mg total) by mouth 2 (two) times daily with a  meal.  nitroGLYCERIN (NITROSTAT) 0.4 MG SL tablet Use one pill every 5 minutes X 3 if needed for chest pain. Contact MD if pain does not improve  spironolactone (ALDACTONE) 25 MG tablet TAKE 1 TABLET (25 MG TOTAL) BY MOUTH DAILY.   Past Medical History:  Diagnosis Date  . Blindness of left eye   . CAD (coronary artery disease)    a. STEMI 09/2015 w/ DES to Prox LAD  . CVA (cerebral infarction)    a. 09/2015: acute small right frontal lobe infarct  . Diabetes ([place])   . Hypertension   . Ischemic cardiomyopathy   . OSA (obstructive sleep apnea) 07/06/2017   Mild OSA with AHI of 8.2/hr with hypoxemia as low as 76% now on CPAP at 11cm H2O.  . PUD (peptic ulcer disease)    Social History   Socioeconomic History  . Marital status: Married    Spouse name: Not on file  . Number of children: Not on file  . Years of education: Not on file  . Highest education level: Not on file  Occupational History  . Occupation: Electrical engineer: [employer]  Social Needs  . Financial resource strain: Not on file  . Food insecurity:    Worry: Not on file    Inability: Not on file  . Transportation needs:    Medical: Not on file  Non-medical: Not on file  Tobacco Use  . Smoking status: Current Some Day Smoker    Types: Cigarettes  . Smokeless tobacco: Current User  . Tobacco comment: 2 cigs per day   Substance and Sexual Activity  . Alcohol use: No    Alcohol/week: 0.0 standard drinks  . Drug use: No  . Sexual activity: Not on file  Lifestyle  . Physical activity:    Days per week: Not on file    Minutes per session: Not on file  . Stress: Not on file  Relationships  . Social connections:    Talks on phone: Not on file    Gets together: Not on file    Attends religious service: Not on file    Active member of club or organization: Not on file    Attends meetings of clubs or organizations: Not on file    Relationship status: Not on file  Other Topics Concern  . Not on  file  Social History Narrative  . Not on file   Family History  Problem Relation Age of Onset  . Diabetes Mother   . Cancer Neg Hx     O:    Component Value Date/Time   CHOL 136 10/05/2015 1522   HDL 19 (L) 10/05/2015 1522   TRIG 63 01/05/2016 1310   AST 43 (H) 01/05/2016 0935   ALT 23 01/05/2016 0935   NA 145 (H) 02/28/2018 0910   K 5.1 02/28/2018 0910   CL 104 02/28/2018 0910   CO2 24 02/28/2018 0910   GLUCOSE 147 (H) 02/28/2018 0910   GLUCOSE 147 (H) 01/17/2018 1308   HGBA1C 6.1 (A) 02/28/2018 0926   HGBA1C 9.6 (H) 12/11/2015 0720   BUN 13 02/28/2018 0910   CREATININE 1.14 02/28/2018 0910   CREATININE 1.38 (H) 12/08/2015 0947   CALCIUM 9.5 02/28/2018 0910   GFRNONAA 67 02/28/2018 0910   GFRAA 77 02/28/2018 0910   WBC 6.7 01/09/2016 0445   HGB 8.7 (L) 01/09/2016 0445   HCT 29.4 (L) 01/09/2016 0445   PLT 274 01/09/2016 0445   TSH 0.196 (L) 10/05/2015 1522   Ht Readings from Last 2 Encounters:  05/15/18 '5\' 6"'$  (1.676 m)  05/08/18 '5\' 6"'$  (1.676 m)   Wt Readings from Last 2 Encounters:  05/15/18 156 lb 1.6 oz (70.8 kg)  05/08/18 157 lb (71.2 kg)   There is no height or weight on file to calculate BMI. BP Readings from Last 3 Encounters:  05/15/18 118/79  05/08/18 (!) 134/92  02/28/18 (!) 145/78     A/P: Eddie [name]' CGM results were downloaded today  Full 14 day report  Above 180 mg/dL 17% of the time In target range (70-180 mg/dL) 82% of the time Below 70 mg/dL 1% of the time  Average 138 mg/dL   Last week, we stopped Eddie [name]' Lantus and started Victoza  Calculated average after change in therapy 122 mg/dL  Calculated time above target after change in therapy 3%  Calculated time in target after change in therapy 95.5% Calculated time below target after change in therapy 1.5%   Recommendations: Eddie [name] is doing great on his new medication regimen, although he is having slightly more hypoglycemia than last week. My recommendations would be to  continue his current dose of Victoza (0.6 mg). At next visit, consider reducing metformin to 1000 mg once daily to address his slight hypoglycemia, as metformin can contribute to hypoglycemia.   This was a co-visit; see Dr. Janne Napoleon note for  further documentation.   Kathyrn Sheriff  PharmD Student   Patient was seen in clinic with Kathyrn Sheriff, PharmD candidate. I agree with the assessment and plan of care documented.

## 2018-05-15 NOTE — Patient Instructions (Signed)
[name], Your blood sugars are looking great! Keep up the good work. Today we discussed drinking glucerna shakes to help with your nutrition intake while you wait on getting you teeth replaced.  Continue taking Victoza 0.6 mg daily, as well as the Metformin 1000 mg twice daily.  It was good seeing you again! You'll see Dr. Mikey Bussing next month on 05/30/18.

## 2018-05-15 NOTE — Assessment & Plan Note (Signed)
Patient has good glycemic control on Victoza 0.6 mg and Metformin 1000 mg BID. Given his hypoglycemic events occurred at night without symptoms, will not make any adjustments at this time. Encouraged patient to ensure he is getting enough nutrition, as it is currently difficult since having all his teeth removed. His insurance will not cover replacements until next year. Advised him to get glucerna shakes to get in extra protein.  Follow-up with PCP scheduled for 11/7.

## 2018-05-15 NOTE — Progress Notes (Signed)
   CC: DM HPI:  [name] is a 67 y.o. gentleman with PMHx listed below who presents for follow-up of DM. This is his visit for second CGM download.  He wore the CGM for 7 days. The average reading was 138, % time in target was 82, % time below target was 1, and % time above target was 17. Hypoglycemia occurred at night, and patient was asymptomatic. He is feeling well since starting Victoza and denies any GI side effects.   Past Medical History:  Diagnosis Date  . Blindness of left eye   . CAD (coronary artery disease)    a. STEMI 09/2015 w/ DES to Prox LAD  . CVA (cerebral infarction)    a. 09/2015: acute small right frontal lobe infarct  . Diabetes ([facility])   . Hypertension   . Ischemic cardiomyopathy   . OSA (obstructive sleep apnea) 07/06/2017   Mild OSA with AHI of 8.2/hr with hypoxemia as low as 76% now on CPAP at 11cm H2O.  . PUD (peptic ulcer disease)    Review of Systems:   Constitutional: negative for fevers, chills CV: negative for chest pain, palpitations Resp: negative for shortness of breath GI: negative for abdominal pain, n/v/d  Physical Exam:  Vitals:   05/15/18 1026  BP: 118/79  Pulse: (!) 102  Temp: 98.7 F (37.1 C)  TempSrc: Oral  SpO2: 100%  Weight: 156 lb 1.6 oz (70.8 kg)  Height: 5\' 6"  (1.676 m)   General: alert, pleasant gentleman, appears stated age, NAD CV: RRR; no murmurs, rubs or gallops Pulm: normal respiratory effort; coarse breath sounds without wheezes or crackles   Assessment & Plan:   See Encounters Tab for problem based charting.  Patient seen with Dr. Rogelia Boga

## 2018-05-16 NOTE — Progress Notes (Signed)
Internal Medicine Clinic Attending  I saw and evaluated the patient.  I personally confirmed the key portions of the history and exam documented by Dr. Chesley Mires and I reviewed pertinent patient test results.  The assessment, diagnosis, and plan were formulated together and I agree with the documentation in the resident's note.   I personally reviewed the data & the resident's interpretation and agree.

## 2018-05-29 MED FILL — CARVEDILOL 25 MG TABLET: 25 | 90 days supply | Qty: 180 | Fill #1

## 2018-05-29 NOTE — Progress Notes (Signed)
Subjective:  HPI: [name] is a 67 y.o. male who presents for f/u DM  Please see Assessment and Plan below for the status of his chronic medical problems.  Review of Systems: Review of Systems  Constitutional: Negative for fever.  Cardiovascular: Negative for chest pain and leg swelling.  Genitourinary: Negative for dysuria.  Musculoskeletal: Positive for joint pain. Negative for falls.  Neurological: Negative for dizziness.  Endo/Heme/Allergies: Negative for polydipsia.    Objective:  Physical Exam: Vitals:   05/30/18 0953  BP: 98/67  Pulse: (!) 102  Temp: 98.3 F (36.8 C)  TempSrc: Oral  SpO2: 100%  Weight: 151 lb 12.8 oz (68.9 kg)  Height: 5\' 6"  (1.676 m)   Body mass index is 24.5 kg/m. Physical Exam  Constitutional: He appears well-developed and well-nourished.  HENT:  Head: Normocephalic and atraumatic.  Cardiovascular: Normal rate, regular rhythm and normal heart sounds.  Pulmonary/Chest: Effort normal and breath sounds normal.  Abdominal: Soft. Bowel sounds are normal.  Musculoskeletal:  Decreased range of motion of right shoulder decreased abduction specifically to about 110 degrees.  Has painful arc and positive Neer's impingement  Nursing note and vitals reviewed.  Assessment & Plan:  Essential hypertension HPI: No complaints doing well  Assessment essential hypertension  Plan Continue spironolactone 25 mg daily Continue Entresto twice daily Continue Lasix 20 mg twice daily Continue Coreg 25 mg twice daily Continue amlodipine 5 mg daily  Chronic combined systolic and diastolic heart failure ([facility]CC) HPI: No issues no shortness of breath.  Overall doing well reports that he has upcoming appointment with Dr. Gala Romney next month.  A; Chronic combined CHF  P: Continue current medications including Entresto, carvedilol, spironolactone.  CAD in native artery No acute issues request refill of nitroglycerin tablet which was provided today  (previous Rx expired)  Controlled type 2 diabetes mellitus with diabetic polyneuropathy, with long-term current use of insulin (HCC) HPI: Since our last visit he was seen in our clinic and a CGM was placed he had some nocturnal hypoglycemia and he was switched from Lantus to Victoza.  He reports he is doing very well with Victoza he has not had any further hypoglycemia.  He brings his car meter and record shows that he has been very well controlled with once a day a.m. blood sugar checks.  Assessment well-controlled type 2 diabetes  Plan Continue Victoza.  Pain in right shoulder HPI: He reports that he is had a few months of pain in his right shoulder.  He has had some difficulty raising his arm up to grab things.  It also is worse at night when sleeping on the right side.  This feels like his left shoulder before we did a steroid injection and he would like a steroid injection today.  Assessment right shoulder pain suspected impingement with possible rotator cuff tendinopathy  Plan Steroid injection performed today   Medications Ordered Meds ordered this encounter  Medications  . nitroGLYCERIN (NITROSTAT) 0.4 MG SL tablet    Sig: Use one pill every 5 minutes X 3 if needed for chest pain. Contact MD if pain does not improve    Dispense:  30 tablet    Refill:  0   Other Orders Orders Placed This Encounter  Procedures  . Glucose, capillary  . POC Hbg A1C   Follow Up: Return 4-6 months with dr Mikey Bussing.  PROCEDURE NOTE  PROCEDURE: right shoulder joint steroid injection.  PREOPERATIVE DIAGNOSIS: Bursitis of the right shoulder.  POSTOPERATIVE DIAGNOSIS: Bursitis of  the right shoulder.  PROCEDURE: The patient was apprised of the risks and the benefits of the procedure and informed consent was obtained. Time-out procedure was performed, with confirmation of the patient's name, date of birth, and correct identification of the right shoulder to be injected. The patient's shoulder  was then marked at the appropriate site for injection placement. The shoulder was sterilely prepped with Betadine. A 40 mg (1 milliliter) solution of Kenalog was drawn up into a 3 mL syringe with a 2 mL of 1% lidocaine. The patient was injected with a 27 gauge needle at the lateral  aspect of his  right shoulder. There were no complications. The patient tolerated the procedure well. There was minimal bleeding. The patient was instructed to ice her shoulder upon leaving clinic and refrain from overuse over the next 3 days. The patient was instructed to go to the emergency room with any usual pain, swelling, or redness occurred in the injected area. The patient was given a followup appointment to evaluate response to the injection to his increased range of motion and reduction of pain.

## 2018-05-30 ENCOUNTER — Other Ambulatory Visit: Payer: Self-pay

## 2018-05-30 ENCOUNTER — Ambulatory Visit (INDEPENDENT_AMBULATORY_CARE_PROVIDER_SITE_OTHER): Payer: Medicare Other | Admitting: Internal Medicine

## 2018-05-30 ENCOUNTER — Encounter: Payer: Self-pay | Admitting: Internal Medicine

## 2018-05-30 ENCOUNTER — Ambulatory Visit: Payer: Self-pay | Admitting: Dietician

## 2018-05-30 VITALS — BP 98/67 | HR 102 | Temp 98.3°F | Ht 66.0 in | Wt 151.8 lb

## 2018-05-30 DIAGNOSIS — G8929 Other chronic pain: Secondary | ICD-10-CM

## 2018-05-30 DIAGNOSIS — I2510 Atherosclerotic heart disease of native coronary artery without angina pectoris: Secondary | ICD-10-CM

## 2018-05-30 DIAGNOSIS — I5042 Chronic combined systolic (congestive) and diastolic (congestive) heart failure: Secondary | ICD-10-CM

## 2018-05-30 DIAGNOSIS — Z794 Long term (current) use of insulin: Secondary | ICD-10-CM

## 2018-05-30 DIAGNOSIS — I110 Hypertensive heart disease with heart failure: Secondary | ICD-10-CM | POA: Diagnosis not present

## 2018-05-30 DIAGNOSIS — I10 Essential (primary) hypertension: Secondary | ICD-10-CM

## 2018-05-30 DIAGNOSIS — M7551 Bursitis of right shoulder: Secondary | ICD-10-CM

## 2018-05-30 DIAGNOSIS — E1142 Type 2 diabetes mellitus with diabetic polyneuropathy: Secondary | ICD-10-CM

## 2018-05-30 DIAGNOSIS — M25511 Pain in right shoulder: Secondary | ICD-10-CM | POA: Insufficient documentation

## 2018-05-30 DIAGNOSIS — Z79899 Other long term (current) drug therapy: Secondary | ICD-10-CM

## 2018-05-30 LAB — POCT GLYCOSYLATED HEMOGLOBIN (HGB A1C): HEMOGLOBIN A1C: 6.4 % — AB (ref 4.0–5.6)

## 2018-05-30 LAB — GLUCOSE, CAPILLARY: GLUCOSE-CAPILLARY: 117 mg/dL — AB (ref 70–99)

## 2018-05-30 MED ORDER — NITROGLYCERIN 0.4 MG SL SUBL: SUBLINGUAL_TABLET | SUBLINGUAL | 0 refills | Status: AC

## 2018-05-30 NOTE — Assessment & Plan Note (Signed)
No acute issues request refill of nitroglycerin tablet which was provided today (previous Rx expired)

## 2018-05-30 NOTE — Assessment & Plan Note (Signed)
HPI: No complaints doing well  Assessment essential hypertension  Plan Continue spironolactone 25 mg daily Continue Entresto twice daily Continue Lasix 20 mg twice daily Continue Coreg 25 mg twice daily Continue amlodipine 5 mg daily

## 2018-05-30 NOTE — Assessment & Plan Note (Signed)
HPI: Since our last visit he was seen in our clinic and a CGM was placed he had some nocturnal hypoglycemia and he was switched from Lantus to Victoza.  He reports he is doing very well with Victoza he has not had any further hypoglycemia.  He brings his car meter and record shows that he has been very well controlled with once a day a.m. blood sugar checks.  Assessment well-controlled type 2 diabetes  Plan Continue Victoza.

## 2018-05-30 NOTE — Assessment & Plan Note (Signed)
HPI: No issues no shortness of breath.  Overall doing well reports that he has upcoming appointment with Dr. Gala Romney next month.  A; Chronic combined CHF  P: Continue current medications including Entresto, carvedilol, spironolactone.

## 2018-05-30 NOTE — Assessment & Plan Note (Signed)
HPI: He reports that he is had a few months of pain in his right shoulder.  He has had some difficulty raising his arm up to grab things.  It also is worse at night when sleeping on the right side.  This feels like his left shoulder before we did a steroid injection and he would like a steroid injection today.  Assessment right shoulder pain suspected impingement with possible rotator cuff tendinopathy  Plan Steroid injection performed today

## 2018-06-10 MED FILL — ACCU-CHEK GUIDE STRP: 33 days supply | Qty: 100 | Fill #2

## 2018-06-10 MED FILL — ENTRESTO 97 MG-103 MG TAB: 97-103 | 30 days supply | Qty: 60 | Fill #4

## 2018-06-10 MED FILL — FUROSEMIDE 20 MG TABS: 20 | 30 days supply | Qty: 60 | Fill #2

## 2018-06-10 MED FILL — ATORVASTATIN 80 MG TABLET: 80 | 30 days supply | Qty: 30 | Fill #1

## 2018-06-10 MED FILL — ACCU-CHEK FASTCLIX LANCETS: 34 days supply | Qty: 102 | Fill #2

## 2018-06-10 MED FILL — AMLODIPINE BESYLATE 5 MG TA: 5 | 30 days supply | Qty: 30 | Fill #1

## 2018-06-10 MED FILL — SPIRONOLACTONE 25 MG TABLET: 25 | 30 days supply | Qty: 30 | Fill #4

## 2018-06-10 MED FILL — CLOPIDOGREL 75 MG TABLET: 75 | 30 days supply | Qty: 30 | Fill #2

## 2018-06-13 ENCOUNTER — Other Ambulatory Visit: Payer: Self-pay

## 2018-06-13 DIAGNOSIS — Z794 Long term (current) use of insulin: Principal | ICD-10-CM

## 2018-06-13 DIAGNOSIS — E1142 Type 2 diabetes mellitus with diabetic polyneuropathy: Secondary | ICD-10-CM

## 2018-06-13 MED ORDER — LIRAGLUTIDE 18 MG/3ML ~~LOC~~ SOPN: 0.6000 mg | PEN_INJECTOR | Freq: Every day | SUBCUTANEOUS | 5 refills | Status: DC

## 2018-06-13 MED FILL — LATANOPROST 0.005% EYE DRP: 0.005 | 25 days supply | Qty: 3 | Fill #0

## 2018-06-13 MED FILL — DORZOLAMIDE-TIMOLOL EYE DRP: 22.3-6.8 | 50 days supply | Qty: 10 | Fill #0

## 2018-06-13 NOTE — Telephone Encounter (Signed)
liraglutide (VICTOZA) 18 MG/3ML SOPN, refill request @  [facility] - [place], [place] - [address]. [phone] (Phone) [phone] (Fax)

## 2018-06-25 MED FILL — metFORMIN HCL 1000 MG TABS: 1000 | 90 days supply | Qty: 180 | Fill #2

## 2018-07-08 MED FILL — VICTOZA 2-PAK 18 MG/3 ML PE: 18 | 60 days supply | Qty: 6 | Fill #0

## 2018-07-08 MED FILL — SPIRONOLACTONE 25 MG TABLET: 25 | 30 days supply | Qty: 30 | Fill #5

## 2018-07-11 ENCOUNTER — Encounter (HOSPITAL_COMMUNITY): Payer: Self-pay | Admitting: Internal Medicine

## 2018-07-11 ENCOUNTER — Ambulatory Visit (HOSPITAL_BASED_OUTPATIENT_CLINIC_OR_DEPARTMENT_OTHER)
Admission: RE | Admit: 2018-07-11 | Discharge: 2018-07-11 | Disposition: A | Discharge status: Home / Self Care | Payer: Medicare Other | Source: Ambulatory Visit | Attending: Internal Medicine | Admitting: Internal Medicine

## 2018-07-11 ENCOUNTER — Ambulatory Visit (HOSPITAL_COMMUNITY)
Admission: RE | Admit: 2018-07-11 | Discharge: 2018-07-11 | Disposition: A | Discharge status: Home / Self Care | Payer: Medicare Other | Source: Ambulatory Visit | Attending: Internal Medicine | Admitting: Internal Medicine

## 2018-07-11 VITALS — BP 134/78 | HR 95 | Wt 150.2 lb

## 2018-07-11 DIAGNOSIS — Z79899 Other long term (current) drug therapy: Secondary | ICD-10-CM | POA: Insufficient documentation

## 2018-07-11 DIAGNOSIS — G4733 Obstructive sleep apnea (adult) (pediatric): Secondary | ICD-10-CM

## 2018-07-11 DIAGNOSIS — F17210 Nicotine dependence, cigarettes, uncomplicated: Secondary | ICD-10-CM | POA: Insufficient documentation

## 2018-07-11 DIAGNOSIS — Z955 Presence of coronary angioplasty implant and graft: Secondary | ICD-10-CM | POA: Insufficient documentation

## 2018-07-11 DIAGNOSIS — Z7982 Long term (current) use of aspirin: Secondary | ICD-10-CM | POA: Insufficient documentation

## 2018-07-11 DIAGNOSIS — Z720 Tobacco use: Secondary | ICD-10-CM

## 2018-07-11 DIAGNOSIS — H5462 Unqualified visual loss, left eye, normal vision right eye: Secondary | ICD-10-CM | POA: Diagnosis not present

## 2018-07-11 DIAGNOSIS — I10 Essential (primary) hypertension: Secondary | ICD-10-CM

## 2018-07-11 DIAGNOSIS — I255 Ischemic cardiomyopathy: Secondary | ICD-10-CM | POA: Diagnosis not present

## 2018-07-11 DIAGNOSIS — Z794 Long term (current) use of insulin: Secondary | ICD-10-CM | POA: Insufficient documentation

## 2018-07-11 DIAGNOSIS — Z8673 Personal history of transient ischemic attack (TIA), and cerebral infarction without residual deficits: Secondary | ICD-10-CM | POA: Diagnosis not present

## 2018-07-11 DIAGNOSIS — I2510 Atherosclerotic heart disease of native coronary artery without angina pectoris: Secondary | ICD-10-CM | POA: Insufficient documentation

## 2018-07-11 DIAGNOSIS — I5022 Chronic systolic (congestive) heart failure: Secondary | ICD-10-CM | POA: Insufficient documentation

## 2018-07-11 DIAGNOSIS — I110 Hypertensive heart disease with heart failure: Secondary | ICD-10-CM | POA: Insufficient documentation

## 2018-07-11 DIAGNOSIS — Z8711 Personal history of peptic ulcer disease: Secondary | ICD-10-CM | POA: Diagnosis not present

## 2018-07-11 DIAGNOSIS — I252 Old myocardial infarction: Secondary | ICD-10-CM | POA: Diagnosis not present

## 2018-07-11 DIAGNOSIS — E119 Type 2 diabetes mellitus without complications: Secondary | ICD-10-CM | POA: Diagnosis not present

## 2018-07-11 DIAGNOSIS — N183 Chronic kidney disease, stage 3 unspecified: Secondary | ICD-10-CM

## 2018-07-11 DIAGNOSIS — Z7902 Long term (current) use of antithrombotics/antiplatelets: Secondary | ICD-10-CM | POA: Diagnosis not present

## 2018-07-11 DIAGNOSIS — E1142 Type 2 diabetes mellitus with diabetic polyneuropathy: Secondary | ICD-10-CM

## 2018-07-11 NOTE — Progress Notes (Signed)
Advanced Heart Failure Clinic Note    HPI:  Eddie [name] is a 67 y.o. male  with h/o DM2, CAD s/p NSTEMI with LAD stent in 3/17, CVA, chronic systolic HF with EF 60-73%.   Had multiple admissions in May and June 2017 in the setting of decompensated CHF and EColi bacteremia. Readmitted in June 2017 with acute respiratory failure requiring intubation in setting of severe HTN and ADHF. Treated with milrinone which was eventually weaned off.     He presents today for HF follow up. Says he feels great. Denies SOB, angina, edema, orthopnea or PND. Weight stable. Smokes a few cigarettes per day. Taking lasix 79m bid    Echo 01/11/17: EF 40%.   RV ok.  ECHO 06/19/2016: EF 20-25%.Cannot exclude small  laminar apical thrombus. No mobile or protuberant thrombus is  seen. Echo 5/15 EF 15-20%  Review of systems complete and found to be negative unless listed in HPI.    Past Medical History:  Diagnosis Date  . Blindness of left eye   . CAD (coronary artery disease)    a. STEMI 09/2015 w/ DES to Prox LAD  . CVA (cerebral infarction)    a. 09/2015: acute small right frontal lobe infarct  . [place]   . Hypertension   . Ischemic cardiomyopathy   . OSA (obstructive sleep apnea) 07/06/2017   Mild OSA with AHI of 8.2/hr with hypoxemia as low as 76% now on CPAP at 11cm H2O.  . PUD (peptic ulcer disease)   . ST elevation myocardial infarction involving left anterior descending (LAD) coronary artery ([place] 09/28/2015    Current Outpatient Medications  Medication Sig Dispense Refill  . ACCU-CHEK FASTCLIX LANCETS MISC Use to check blood sugar 3 times a day 100 each 2  . Acetaminophen (TYLENOL) 325 MG CAPS Take 2 tablets by mouth every 4 (four) hours as needed.    .Marland Kitchenalbuterol (PROVENTIL HFA;VENTOLIN HFA) 108 (90 Base) MCG/ACT inhaler Inhale 2 puffs into the lungs every 6 (six) hours as needed for wheezing or shortness of breath. 1 Inhaler 2  . amLODipine (NORVASC) 5 MG tablet Take 1 tablet (5  mg total) by mouth daily. 30 tablet 3  . aspirin EC 81 MG tablet Take 1 tablet (81 mg total) by mouth daily. 90 tablet 3  . atorvastatin (LIPITOR) 80 MG tablet TAKE 1 TABLET BY MOUTH ONCE DAILY AT 6 PM. 30 tablet 5  . bimatoprost (LUMIGAN) 0.03 % ophthalmic solution Place 1 drop into the right eye at bedtime.    . Blood Glucose Monitoring Suppl (ACCU-CHEK GUIDE) w/Device KIT 1 kit by Does not apply route 2 (two) times daily. 1 kit 0  . brimonidine (ALPHAGAN) 0.2 % ophthalmic solution Apply to eye.    . carvedilol (COREG) 25 MG tablet Take 1 tablet (25 mg total) by mouth 2 (two) times daily. 180 tablet 3  . clopidogrel (PLAVIX) 75 MG tablet TAKE 1 TABLET BY MOUTH DAILY. 30 tablet 5  . ENTRESTO 97-103 MG TAKE 1 TABLET BY MOUTH 2 TIMES DAILY. 60 tablet 5  . furosemide (LASIX) 20 MG tablet TAKE 1 TABLET BY MOUTH TWICE A DAY 60 tablet 5  . glucose blood (ACCU-CHEK GUIDE) test strip Use to check blood sugar 3 times daily 100 each 12  . Insulin Pen Needle 31G X 5 MM MISC Use to inject insulin one time a day 100 each 5  . liraglutide (VICTOZA) 18 MG/3ML SOPN Inject 0.1 mLs (0.6 mg total) into the  skin daily. 6 mL 5  . metFORMIN (GLUCOPHAGE) 1000 MG tablet Take 1 tablet (1,000 mg total) by mouth 2 (two) times daily with a meal. 180 tablet 3  . spironolactone (ALDACTONE) 25 MG tablet TAKE 1 TABLET (25 MG TOTAL) BY MOUTH DAILY. 30 tablet 5  . nitroGLYCERIN (NITROSTAT) 0.4 MG SL tablet Use one pill every 5 minutes X 3 if needed for chest pain. Contact MD if pain does not improve (Patient not taking: Reported on 07/11/2018) 30 tablet 0   No current facility-administered medications for this encounter.     Allergies  Allergen Reactions  . Bidil [Isosorb Dinitrate-Hydralazine] Other (See Comments)    Patient became lightheaded with 1 tablet tid.       Social History   Socioeconomic History  . Marital status: Married    Spouse name: Not on file  . Number of children: Not on file  . Years of  education: Not on file  . Highest education level: Not on file  Occupational History  . Occupation: Electrical engineer: [employer]  Social Needs  . Financial resource strain: Not on file  . Food insecurity:    Worry: Not on file    Inability: Not on file  . Transportation needs:    Medical: Not on file    Non-medical: Not on file  Tobacco Use  . Smoking status: Current Some Day Smoker    Types: Cigarettes  . Smokeless tobacco: Current User  . Tobacco comment: 2 cigs per day   Substance and Sexual Activity  . Alcohol use: No    Alcohol/week: 0.0 standard drinks  . Drug use: No  . Sexual activity: Not on file  Lifestyle  . Physical activity:    Days per week: Not on file    Minutes per session: Not on file  . Stress: Not on file  Relationships  . Social connections:    Talks on phone: Not on file    Gets together: Not on file    Attends religious service: Not on file    Active member of club or organization: Not on file    Attends meetings of clubs or organizations: Not on file    Relationship status: Not on file  . Intimate partner violence:    Fear of current or ex partner: Not on file    Emotionally abused: Not on file    Physically abused: Not on file    Forced sexual activity: Not on file  Other Topics Concern  . Not on file  Social History Narrative  . Not on file      Family History  Problem Relation Age of Onset  . Diabetes Mother   . Cancer Neg Hx     Vitals:   07/11/18 1215  BP: 134/78  Pulse: 95  SpO2: 95%  Weight: 68.1 kg (150 lb 3.2 oz)   Wt Readings from Last 3 Encounters:  07/11/18 68.1 kg (150 lb 3.2 oz)  05/30/18 68.9 kg (151 lb 12.8 oz)  05/15/18 70.8 kg (156 lb 1.6 oz)     PHYSICAL EXAM: General:  Well appearing. No resp difficulty HEENT: normal x for poor dentition  Neck: supple. no JVD. Carotids 2+ bilat; no bruits. No lymphadenopathy or thryomegaly appreciated. Cor: PMI nondisplaced. Regular rate & rhythm. No  rubs, gallops or murmurs. Lungs: clear Abdomen: soft, nontender, nondistended. No hepatosplenomegaly. No bruits or masses. Good bowel sounds. Extremities: no cyanosis, clubbing, rash, edema Neuro:  alert & orientedx3, cranial nerves grossly intact. moves all 4 extremities w/o difficulty. Affect pleasant   ASSESSMENT & PLAN: 1. Chronicsystolic CHF : ICM May 1460 EF 15-20% and 12/17 EF 25-30%. Echo 12/2016 EF improved 40% - Echo today shows EF 30-35%. Personally reviewed - NYHA I - Volume status OK - Continue lasix 20 mg BID.  - Continue Entresto 97/103 mg BID. BMET today.  - Continue Coreg to 25 mg BID - Continue Spiro 25 mg daily - Intolerant to Bidil in the past with dizziness and headaches.  - No ICD with NYHA I    2. CAD: s/p DES to proximal LAD in 09/2015  - No s/s of ischemia.    - Continue ASA, Plavix and atorvastatin.   3. HTN - Blood pressure well controlled. Continue current regimen.  4. 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  - Continue ASA and Plavix.  - No change.   4. DMII - Per PCP.  - Consider Jardiance  5. OSA - Encouraged nightly CPAP.    6. Tobacco use - Encouraged cessation.   Glori Bickers, MD  12:54 PM

## 2018-07-11 NOTE — Addendum Note (Signed)
Encounter addended by: Marisa Hua, RN on: 07/11/2018 12:56 PM  Actions taken: Clinical Note Signed

## 2018-07-11 NOTE — Progress Notes (Signed)
  Echocardiogram 2D Echocardiogram has been performed.  Eddie [name] Eddie [name] 07/11/2018, 11:56 AM

## 2018-07-11 NOTE — Patient Instructions (Signed)
Your physician recommends that you schedule a follow-up appointment in: 6 months. Please call in April for your appointment.

## 2018-07-12 DIAGNOSIS — H401133 Primary open-angle glaucoma, bilateral, severe stage: Secondary | ICD-10-CM | POA: Diagnosis not present

## 2018-07-12 MED FILL — ENTRESTO 97 MG-103 MG TAB: 97-103 | 30 days supply | Qty: 60 | Fill #5

## 2018-07-12 MED FILL — FUROSEMIDE 20 MG TABS: 20 | 30 days supply | Qty: 60 | Fill #3

## 2018-07-12 MED FILL — ATORVASTATIN 80 MG TABLET: 80 | 30 days supply | Qty: 30 | Fill #2

## 2018-07-15 MED FILL — CLOPIDOGREL 75 MG TABLET: 75 | 30 days supply | Qty: 30 | Fill #3

## 2018-07-23 MED FILL — AMLODIPINE BESYLATE 5 MG TA: 5 | 30 days supply | Qty: 30 | Fill #2

## 2018-08-19 ENCOUNTER — Other Ambulatory Visit (HOSPITAL_COMMUNITY): Payer: Self-pay | Admitting: Internal Medicine

## 2018-08-19 MED FILL — FUROSEMIDE 20 MG TABS: 20 | 30 days supply | Qty: 60 | Fill #4

## 2018-08-19 MED FILL — UNIFINE PENTIPS 31GX3/16: 31G X 5 MM | 90 days supply | Qty: 100 | Fill #1

## 2018-08-19 MED FILL — UNIFINE PENTIPS 31GX3/16": 31G X 5 MM | 90 days supply | Qty: 100 | Fill #1

## 2018-08-19 MED FILL — ENTRESTO 97 MG-103 MG TAB: 97-103 | 30 days supply | Qty: 60 | Fill #0

## 2018-08-19 MED FILL — CLOPIDOGREL 75 MG TABLET: 75 | 30 days supply | Qty: 30 | Fill #4

## 2018-08-19 MED FILL — AMLODIPINE BESYLATE 5 MG TA: 5 | 30 days supply | Qty: 30 | Fill #3

## 2018-08-19 MED FILL — ATORVASTATIN 80 MG TABLET: 80 | 30 days supply | Qty: 30 | Fill #3

## 2018-08-19 MED FILL — SPIRONOLACTONE 25 MG TABLET: 25 | 30 days supply | Qty: 30 | Fill #0

## 2018-08-26 MED FILL — CARVEDILOL 25 MG TABLET: 25 | 90 days supply | Qty: 180 | Fill #2 | Status: TO

## 2018-09-02 MED FILL — VICTOZA 2-PAK 18 MG/3 ML PE: 18 | 60 days supply | Qty: 6 | Fill #1

## 2018-09-14 MED FILL — SPIRONOLACTONE 25 MG TABLET: 25 | 30 days supply | Qty: 30 | Fill #1

## 2018-09-14 MED FILL — AMLODIPINE BESYLATE 5 MG TA: 5 | 30 days supply | Qty: 30 | Fill #4

## 2018-09-14 MED FILL — FUROSEMIDE 20 MG TABS: 20 | 30 days supply | Qty: 60 | Fill #5

## 2018-09-14 MED FILL — CLOPIDOGREL 75 MG TABLET: 75 | 30 days supply | Qty: 30 | Fill #5

## 2018-09-14 MED FILL — ATORVASTATIN 80 MG TABLET: 80 | 30 days supply | Qty: 30 | Fill #4

## 2018-09-14 MED FILL — ENTRESTO 97 MG-103 MG TAB: 97-103 | 30 days supply | Qty: 60 | Fill #1

## 2018-09-17 MED FILL — metFORMIN HCL 1000 MG TABS: 1000 | 90 days supply | Qty: 180 | Fill #3

## 2018-09-24 MED FILL — ACCU-CHEK GUIDE STRP: 33 days supply | Qty: 100 | Fill #3

## 2018-09-25 ENCOUNTER — Other Ambulatory Visit: Payer: Self-pay | Admitting: *Deleted

## 2018-09-25 DIAGNOSIS — Z794 Long term (current) use of insulin: Principal | ICD-10-CM

## 2018-09-25 DIAGNOSIS — E1142 Type 2 diabetes mellitus with diabetic polyneuropathy: Secondary | ICD-10-CM

## 2018-09-25 DIAGNOSIS — E1140 Type 2 diabetes mellitus with diabetic neuropathy, unspecified: Secondary | ICD-10-CM

## 2018-09-26 MED ORDER — ACCU-CHEK FASTCLIX LANCETS MISC: 3 refills | Status: DC

## 2018-09-26 MED FILL — ACCU-CHEK FASTCLIX LANCETS: 90 days supply | Qty: 102 | Fill #0

## 2018-09-26 NOTE — Telephone Encounter (Signed)
Called pt - stated he uses [facility].

## 2018-09-26 NOTE — Telephone Encounter (Signed)
Patient is no longer on insulin, I would have him decrease the frequency of testing to once a day.  I have updated the Rx. Does this need to go to the outpatient phamracy or [facility]?

## 2018-09-27 DIAGNOSIS — H26491 Other secondary cataract, right eye: Secondary | ICD-10-CM | POA: Diagnosis not present

## 2018-10-03 ENCOUNTER — Ambulatory Visit (INDEPENDENT_AMBULATORY_CARE_PROVIDER_SITE_OTHER): Payer: Medicare Other | Admitting: Internal Medicine

## 2018-10-03 ENCOUNTER — Ambulatory Visit: Payer: Self-pay | Admitting: Internal Medicine

## 2018-10-03 ENCOUNTER — Ambulatory Visit: Payer: Self-pay | Admitting: Dietician

## 2018-10-03 ENCOUNTER — Other Ambulatory Visit: Payer: Self-pay

## 2018-10-03 ENCOUNTER — Ambulatory Visit (INDEPENDENT_AMBULATORY_CARE_PROVIDER_SITE_OTHER): Payer: Medicare Other | Admitting: Dietician

## 2018-10-03 ENCOUNTER — Encounter: Payer: Self-pay | Admitting: Internal Medicine

## 2018-10-03 ENCOUNTER — Encounter: Payer: Self-pay | Admitting: Dietician

## 2018-10-03 VITALS — BP 114/75 | HR 89 | Temp 98.3°F | Ht 66.0 in | Wt 155.6 lb

## 2018-10-03 DIAGNOSIS — E1140 Type 2 diabetes mellitus with diabetic neuropathy, unspecified: Secondary | ICD-10-CM

## 2018-10-03 DIAGNOSIS — N62 Hypertrophy of breast: Secondary | ICD-10-CM

## 2018-10-03 DIAGNOSIS — I110 Hypertensive heart disease with heart failure: Secondary | ICD-10-CM

## 2018-10-03 DIAGNOSIS — I5042 Chronic combined systolic (congestive) and diastolic (congestive) heart failure: Secondary | ICD-10-CM

## 2018-10-03 DIAGNOSIS — Z7984 Long term (current) use of oral hypoglycemic drugs: Secondary | ICD-10-CM

## 2018-10-03 DIAGNOSIS — Z79899 Other long term (current) drug therapy: Secondary | ICD-10-CM

## 2018-10-03 DIAGNOSIS — Z713 Dietary counseling and surveillance: Secondary | ICD-10-CM | POA: Diagnosis not present

## 2018-10-03 DIAGNOSIS — I10 Essential (primary) hypertension: Secondary | ICD-10-CM

## 2018-10-03 DIAGNOSIS — R208 Other disturbances of skin sensation: Secondary | ICD-10-CM

## 2018-10-03 LAB — GLUCOSE, CAPILLARY: Glucose-Capillary: 142 mg/dL — ABNORMAL HIGH (ref 70–99)

## 2018-10-03 LAB — POCT GLYCOSYLATED HEMOGLOBIN (HGB A1C): Hemoglobin A1C: 5.6 % (ref 4.0–5.6)

## 2018-10-03 MED ORDER — EPLERENONE 25 MG PO TABS: 25.0000 mg | ORAL_TABLET | Freq: Every day | ORAL | 3 refills | Status: DC

## 2018-10-03 NOTE — Progress Notes (Signed)
Subjective:  HPI: [name] is a 68 y.o. male who presents for f/u DM  Please see Assessment and Plan below for the status of his chronic medical problems.  Review of Systems: Review of Systems  Constitutional: Negative for weight loss.  HENT: Negative for hearing loss.   Respiratory: Negative for cough and shortness of breath.   Cardiovascular: Negative for leg swelling and PND.  Neurological: Positive for sensory change (right hand cold). Negative for dizziness, speech change, focal weakness and headaches.       Objective:  Physical Exam: Vitals:   10/03/18 1050  BP: 114/75  Pulse: 89  Temp: 98.3 F (36.8 C)  TempSrc: Oral  SpO2: 100%  Weight: 155 lb 9.6 oz (70.6 kg)  Height: 5\' 6"  (1.676 m)   Body mass index is 25.11 kg/m. Physical Exam Vitals signs and nursing note reviewed.  Constitutional:      Appearance: Normal appearance.  Cardiovascular:     Rate and Rhythm: Normal rate and regular rhythm.  Pulmonary:     Effort: Pulmonary effort is normal.     Breath sounds: Normal breath sounds.  Musculoskeletal:     Right lower leg: No edema.     Left lower leg: No edema.     Comments: Mild gynaclomastia bilateral  Skin:    Capillary Refill: Capillary refill takes less than 2 seconds. Right hand Neurological:     General: No focal deficit present.     Mental Status: He is alert. Mental status is at baseline.      Assessment & Plan:  Controlled type 2 diabetes mellitus with diabetic neuropathy, without long-term current use of insulin (HCC) HPI: No complaints taking metformin and Victoza without issue no hypoglycemia.  Checks blood sugar once a day he brings his meter and overall his blood sugars are well controlled highest reading was 189 lowest 108 average 133.  His A1c is 5.6% which is a little bit lower average.  Assessment well-controlled type 2 diabetes with diabetic neuropathy  Plan Continue metformin 1 g twice daily Continue Victoza 0.6 mg daily  Reviewed Dr. Genia Plants note regarding starting Jardiance given his diabetes is well controlled with both evidence-based medicines of metformin and Victoza and his heart failure is currently well controlled I would not be inclined to start that at this time however we will keep in mind if blood sugars run higher.  Chronic combined systolic and diastolic heart failure (HCC) HPI: He did follow-up with Dr. Gala Romney a few months ago and is doing well his ejection fraction remains low but stable at 30 to 35%.  Been taking his heart failure medications as directed.  His only complaint today is that he has been having some bilateral but left greater than right breast pain.  He reports that overall it is constant and occasionally throbbing he has noted some growth of breast tissue.  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  I changed spironolactone to eplerenone due to painful gynecomastia  Complaining of cold hands HPI: Patient reports intermittent feeling that his right hand is cold he has to put it under his shirt to warm it up.  He denies any pain in the hand.  He has not noticed any color change at his fingertips.  He does not have any new  weakness or other focal neurologic symptoms.  Assessment symptomatic right cold hand  Plan On gross assessment vascular supply is intact with excellent capillary refill.  He does not have completely typical symptoms of raynoud's phenomenon.  There is no other focal signs to suggest a new central lesion.  Overall I discussed with him strategies to mitigate symptoms like using a glove and avoiding cold spots.  We discussed potentially doing further work-up but mutually to hold off unless symptoms  worsen.   Medications Ordered Meds ordered this encounter  Medications  . eplerenone (INSPRA) 25 MG tablet    Sig: Take 1 tablet (25 mg total) by mouth daily.    Dispense:  90 tablet    Refill:  3    Discontinue spironolactone   Other Orders Orders Placed This Encounter  Procedures  . Glucose, capillary  . POC Hbg A1C   Follow Up: Return 4-6 months.

## 2018-10-03 NOTE — Progress Notes (Signed)
  Medical Nutrition Therapy:  Appt start time: 1115 end time:  1145 Visit # 1 this year  Assessment:  Primary concerns today: annual follow up for diabetes  Eddie [name] is here with his wife today. He feels he has been doing well. His blood sugars are in target range. He checks his blood sugar daily before breakfast. He continues to walk 4-5 times per week for 1-1.5 hours. He checks his feet daily and reports no problems. His bowels have been normal. Weight is up 5# from 12/19 and seems to be at his normal weight. He is looking forward to getting dentures and being able to eat harder foods again. He feels he has made an improvement in eating habits and drinking more water. He brought in meter today for download. He has been to eye doctor recently so will attempt to obtain the report   MEDICATIONS: metformin 2000 mg, Victoza 0.6 mg daily   BLOOD SUGAR: Lab Results  Component Value Date   HGBA1C 5.6 10/03/2018    Wt Readings from Last 5 Encounters:  10/03/18 155 lb 9.6 oz (70.6 kg)  07/11/18 150 lb 3.2 oz (68.1 kg)  05/30/18 151 lb 12.8 oz (68.9 kg)  05/15/18 156 lb 1.6 oz (70.8 kg)  05/08/18 157 lb (71.2 kg)   DIETARY INTAKE: Eating fruits and vegetables daily  Beverages: coffee, water, pre-sweetened koolaid   Progress Towards Goal(s):  Resolved. Monitoring    Nutritional Diagnosis:  NB-1.1 Food and nutrition-related knowledge deficit As related to lack of prior diabetes training improved and resolved and now monitoring; As evidenced by patient and spouse's questions and report and lowered A1C.    Intervention:  Nutrition education about target blood sugars, A1C, Foot care, and eye care   Coordination of care: none  Teaching Method Utilized: Visual, Auditory, Hands on Handouts given during visit include: AVS  Barriers to learning/adherence to lifestyle change: material resources Demonstrated degree of understanding via: Teach Back   Monitoring/Evaluation:  Dietary intake, exercise,  meter, and body weight in 6 month(s)  Per patient request.   [name], RD 10/03/2018 2:28 PM. .

## 2018-10-04 DIAGNOSIS — R208 Other disturbances of skin sensation: Secondary | ICD-10-CM | POA: Insufficient documentation

## 2018-10-04 NOTE — Assessment & Plan Note (Signed)
HPI: He did follow-up with Dr. Gala Romney a few months ago and is doing well his ejection fraction remains low but stable at 30 to 35%.  Been taking his heart failure medications as directed.  His only complaint today is that he has been having some bilateral but left greater than right breast pain.  He reports that overall it is constant and occasionally throbbing he has noted some growth of breast tissue.  Assessment chronic combined heart failure, painful gynecomastia due to spironolactone  Plan We will change spironolactone 25 mg daily to eplerenone 25 mg daily due to the painful gynecomastia Continue Entresto Continue carvedilol Continue Lasix

## 2018-10-04 NOTE — Assessment & Plan Note (Signed)
HPI: No complaints taking metformin and Victoza without issue no hypoglycemia.  Checks blood sugar once a day he brings his meter and overall his blood sugars are well controlled highest reading was 189 lowest 108 average 133.  His A1c is 5.6% which is a little bit lower average.  Assessment well-controlled type 2 diabetes with diabetic neuropathy  Plan Continue metformin 1 g twice daily Continue Victoza 0.6 mg daily Reviewed Dr. Genia Plants note regarding starting Jardiance given his diabetes is well controlled with both evidence-based medicines of metformin and Victoza and his heart failure is currently well controlled I would not be inclined to start that at this time however we will keep in mind if blood sugars run higher.

## 2018-10-04 NOTE — Assessment & Plan Note (Signed)
HPI: Patient reports intermittent feeling that his right hand is cold he has to put it under his shirt to warm it up.  He denies any pain in the hand.  He has not noticed any color change at his fingertips.  He does not have any new weakness or other focal neurologic symptoms.  Assessment symptomatic right cold hand  Plan On gross assessment vascular supply is intact with excellent capillary refill.  He does not have completely typical symptoms of raynoud's phenomenon.  There is no other focal signs to suggest a new central lesion.  Overall I discussed with him strategies to mitigate symptoms like using a glove and avoiding cold spots.  We discussed potentially doing further work-up but mutually to hold off unless symptoms worsen.

## 2018-10-04 NOTE — Assessment & Plan Note (Signed)
Well-controlled on current medications.  I changed spironolactone to eplerenone due to painful gynecomastia

## 2018-10-14 MED FILL — VICTOZA 2-PAK 18 MG/3 ML PE: 18 | 60 days supply | Qty: 6 | Fill #2

## 2018-10-15 ENCOUNTER — Other Ambulatory Visit (HOSPITAL_COMMUNITY): Payer: Self-pay | Admitting: Internal Medicine

## 2018-10-15 MED FILL — AMLODIPINE BESYLATE 5 MG TA: 5 | 30 days supply | Qty: 30 | Fill #5

## 2018-10-15 MED FILL — ENTRESTO 97 MG-103 MG TAB: 97-103 | 30 days supply | Qty: 60 | Fill #2 | Status: TO

## 2018-10-16 MED FILL — EPLERENONE 25 MG TABS: 25 | 90 days supply | Qty: 90 | Fill #0

## 2018-10-16 MED FILL — ATORVASTATIN 80 MG TABLET: 80 | 30 days supply | Qty: 30 | Fill #5

## 2018-10-17 ENCOUNTER — Other Ambulatory Visit (HOSPITAL_COMMUNITY): Payer: Self-pay | Admitting: *Deleted

## 2018-10-17 MED ORDER — CLOPIDOGREL BISULFATE 75 MG PO TABS: 75.0000 mg | ORAL_TABLET | Freq: Every day | ORAL | 3 refills | Status: DC

## 2018-10-17 MED ORDER — FUROSEMIDE 20 MG PO TABS: 20.0000 mg | ORAL_TABLET | Freq: Two times a day (BID) | ORAL | 5 refills | Status: DC

## 2018-10-23 MED FILL — FUROSEMIDE 20 MG TABS: 20 | 30 days supply | Qty: 60 | Fill #0

## 2018-10-23 MED FILL — CLOPIDOGREL 75 MG TABLET: 75 | 90 days supply | Qty: 90 | Fill #0

## 2018-10-24 MED FILL — UNIFINE PENTIPS 31GX3/16": 31G X 5 MM | 90 days supply | Qty: 100 | Fill #0

## 2018-10-24 MED FILL — UNIFINE PENTIPS 31GX3/16: 31G X 5 MM | 90 days supply | Qty: 100 | Fill #0

## 2018-11-14 MED FILL — FUROSEMIDE 20 MG TABS: 20 | 30 days supply | Qty: 60 | Fill #1

## 2018-11-15 ENCOUNTER — Other Ambulatory Visit (HOSPITAL_COMMUNITY): Payer: Self-pay | Admitting: Internal Medicine

## 2018-11-15 MED FILL — CARVEDILOL 25 MG TABLET: 25 | 90 days supply | Qty: 180 | Fill #0

## 2018-11-15 MED FILL — ENTRESTO 97 MG-103 MG TAB: 97-103 | 30 days supply | Qty: 60 | Fill #0

## 2018-11-15 MED FILL — AMLODIPINE BESYLATE 5 MG TA: 5 | 30 days supply | Qty: 30 | Fill #0

## 2018-11-18 MED FILL — ATORVASTATIN 80 MG TABLET: 80 | 30 days supply | Qty: 30 | Fill #0

## 2018-12-05 ENCOUNTER — Ambulatory Visit: Payer: Self-pay | Admitting: Internal Medicine

## 2018-12-05 ENCOUNTER — Ambulatory Visit: Payer: Self-pay | Admitting: Dietician

## 2018-12-16 MED FILL — ATORVASTATIN 80 MG TABLET: 80 | 30 days supply | Qty: 30 | Fill #1

## 2018-12-16 MED FILL — AMLODIPINE BESYLATE 5 MG TA: 5 | 30 days supply | Qty: 30 | Fill #1

## 2018-12-16 MED FILL — FUROSEMIDE 20 MG TABS: 20 | 30 days supply | Qty: 60 | Fill #2

## 2018-12-16 MED FILL — ENTRESTO 97 MG-103 MG TAB: 97-103 | 30 days supply | Qty: 60 | Fill #1

## 2018-12-17 MED FILL — VICTOZA 2-PAK 18 MG/3 ML PE: 18 | 60 days supply | Qty: 6 | Fill #3

## 2018-12-18 ENCOUNTER — Other Ambulatory Visit: Payer: Self-pay | Admitting: *Deleted

## 2018-12-18 DIAGNOSIS — Z794 Long term (current) use of insulin: Secondary | ICD-10-CM

## 2018-12-18 DIAGNOSIS — E118 Type 2 diabetes mellitus with unspecified complications: Secondary | ICD-10-CM

## 2018-12-18 MED ORDER — METFORMIN HCL 1000 MG PO TABS: 1000.0000 mg | ORAL_TABLET | Freq: Two times a day (BID) | ORAL | 3 refills | Status: DC

## 2018-12-18 MED FILL — metFORMIN HCL 1000 MG TABS: 1000 | 90 days supply | Qty: 180 | Fill #0

## 2018-12-18 NOTE — Telephone Encounter (Signed)
Fax from [facility] with the following message:  The patient has just received the last refill for this prescription.  This is an advance request to ensure uninterrupted availability of this prescription.   Will send to pcp for review.Kingsley Spittle Cassady5/27/202012:13 PM

## 2018-12-25 ENCOUNTER — Encounter (HOSPITAL_COMMUNITY): Payer: Self-pay | Admitting: Internal Medicine

## 2019-01-22 ENCOUNTER — Other Ambulatory Visit: Payer: Self-pay | Admitting: *Deleted

## 2019-01-22 DIAGNOSIS — E1142 Type 2 diabetes mellitus with diabetic polyneuropathy: Secondary | ICD-10-CM

## 2019-01-22 MED ORDER — ACCU-CHEK GUIDE VI STRP: ORAL_STRIP | 12 refills | Status: DC

## 2019-01-22 MED FILL — CLOPIDOGREL 75 MG TABLET: 75 | 90 days supply | Qty: 90 | Fill #0

## 2019-01-22 MED FILL — AMLODIPINE BESYLATE 5 MG TA: 5 | 30 days supply | Qty: 30 | Fill #0

## 2019-01-22 MED FILL — ENTRESTO 97 MG-103 MG TAB: 97-103 | 30 days supply | Qty: 60 | Fill #0

## 2019-01-22 MED FILL — ACCU-CHEK FASTCLIX LANCETS: 90 days supply | Qty: 102 | Fill #1

## 2019-01-22 MED FILL — FUROSEMIDE 20 MG TABS: 20 | 30 days supply | Qty: 60 | Fill #0

## 2019-01-22 MED FILL — LATANOPROST 0.005% EYE DRP: 0.005 | 25 days supply | Qty: 3 | Fill #1

## 2019-01-22 MED FILL — EPLERENONE 25 MG TABS: 25 | 90 days supply | Qty: 90 | Fill #1

## 2019-01-22 MED FILL — ACCU-CHEK GUIDE TEST STRIP: 33 days supply | Qty: 100 | Fill #0

## 2019-01-22 MED FILL — UNIFINE PENTIPS 31GX3/16": 31G X 5 MM | 90 days supply | Qty: 100 | Fill #0

## 2019-01-22 MED FILL — DORZOLAMIDE-TIMOLOL EYE DRP: 22.3-6.8 | 50 days supply | Qty: 10 | Fill #1

## 2019-01-22 MED FILL — ATORVASTATIN 80 MG TABLET: 80 | 30 days supply | Qty: 30 | Fill #0

## 2019-01-22 MED FILL — UNIFINE PENTIPS 31GX3/16: 31G X 5 MM | 90 days supply | Qty: 100 | Fill #0

## 2019-01-23 ENCOUNTER — Encounter: Payer: Medicare Other | Admitting: Dietician

## 2019-01-23 ENCOUNTER — Ambulatory Visit (INDEPENDENT_AMBULATORY_CARE_PROVIDER_SITE_OTHER): Payer: Medicare Other | Admitting: Internal Medicine

## 2019-01-23 ENCOUNTER — Encounter: Payer: Self-pay | Admitting: Internal Medicine

## 2019-01-23 ENCOUNTER — Other Ambulatory Visit: Payer: Self-pay

## 2019-01-23 VITALS — BP 105/65 | HR 94 | Temp 99.1°F | Ht 66.0 in | Wt 154.0 lb

## 2019-01-23 DIAGNOSIS — I130 Hypertensive heart and chronic kidney disease with heart failure and stage 1 through stage 4 chronic kidney disease, or unspecified chronic kidney disease: Secondary | ICD-10-CM | POA: Diagnosis not present

## 2019-01-23 DIAGNOSIS — IMO0002 Reserved for concepts with insufficient information to code with codable children: Secondary | ICD-10-CM

## 2019-01-23 DIAGNOSIS — E1122 Type 2 diabetes mellitus with diabetic chronic kidney disease: Secondary | ICD-10-CM | POA: Diagnosis not present

## 2019-01-23 DIAGNOSIS — I2510 Atherosclerotic heart disease of native coronary artery without angina pectoris: Secondary | ICD-10-CM

## 2019-01-23 DIAGNOSIS — N183 Chronic kidney disease, stage 3 unspecified: Secondary | ICD-10-CM

## 2019-01-23 DIAGNOSIS — Z79899 Other long term (current) drug therapy: Secondary | ICD-10-CM

## 2019-01-23 DIAGNOSIS — I5042 Chronic combined systolic (congestive) and diastolic (congestive) heart failure: Secondary | ICD-10-CM | POA: Diagnosis not present

## 2019-01-23 DIAGNOSIS — E1140 Type 2 diabetes mellitus with diabetic neuropathy, unspecified: Secondary | ICD-10-CM

## 2019-01-23 DIAGNOSIS — M7542 Impingement syndrome of left shoulder: Secondary | ICD-10-CM

## 2019-01-23 DIAGNOSIS — I10 Essential (primary) hypertension: Secondary | ICD-10-CM

## 2019-01-23 DIAGNOSIS — R208 Other disturbances of skin sensation: Secondary | ICD-10-CM

## 2019-01-23 DIAGNOSIS — E1142 Type 2 diabetes mellitus with diabetic polyneuropathy: Secondary | ICD-10-CM

## 2019-01-23 LAB — POCT GLYCOSYLATED HEMOGLOBIN (HGB A1C): Hemoglobin A1C: 6 % — AB (ref 4.0–5.6)

## 2019-01-23 LAB — GLUCOSE, CAPILLARY: Glucose-Capillary: 108 mg/dL — ABNORMAL HIGH (ref 70–99)

## 2019-01-23 MED ORDER — CARVEDILOL 25 MG PO TABS: 25.0000 mg | ORAL_TABLET | Freq: Two times a day (BID) | ORAL | 3 refills | Status: DC

## 2019-01-23 MED ORDER — AMLODIPINE BESYLATE 5 MG PO TABS: 5.0000 mg | ORAL_TABLET | Freq: Every day | ORAL | 3 refills | Status: DC

## 2019-01-23 MED ORDER — FUROSEMIDE 20 MG PO TABS: 20.0000 mg | ORAL_TABLET | Freq: Two times a day (BID) | ORAL | 1 refills | Status: DC

## 2019-01-23 MED ORDER — VICTOZA 18 MG/3ML ~~LOC~~ SOPN: 0.6000 mg | PEN_INJECTOR | Freq: Every day | SUBCUTANEOUS | 3 refills | Status: DC

## 2019-01-23 MED ORDER — ACCU-CHEK FASTCLIX LANCETS MISC: 3 refills | Status: DC

## 2019-01-23 MED ORDER — INSULIN PEN NEEDLE 31G X 5 MM MISC: 5 refills | Status: DC

## 2019-01-23 MED ORDER — ATORVASTATIN CALCIUM 80 MG PO TABS: ORAL_TABLET | ORAL | 3 refills | Status: DC

## 2019-01-23 MED FILL — VICTOZA 18 MG/3 ML INJECT P: 18 | 90 days supply | Qty: 9 | Fill #0

## 2019-01-23 MED FILL — CARVEDILOL 25 MG TABLET: 25 | 90 days supply | Qty: 180 | Fill #0

## 2019-01-23 MED FILL — ACCU-CHEK FASTCLIX LANCETS: 90 days supply | Qty: 102 | Fill #0

## 2019-01-23 NOTE — Assessment & Plan Note (Signed)
He reports his hands occasionally continue to get cold.  He has been using the strategies we talked about and feels he is doing well.

## 2019-01-23 NOTE — Assessment & Plan Note (Signed)
HPI: No increased shortness of breath.  He is wearing a mask when he is out in public however he reports at times he feels like he gets no shortness of breath and has to take the mask off for a moment.  He was requesting that I write a letter that may allow him to temporarily remove the mask when he become short of breath.  Assessment chronic combined congestive heart failure  Plan Continue current medications he is follow-up with Dr. Jeffie Pollock scheduled Encouraged use of mask but wrote letter for exemption if needed

## 2019-01-23 NOTE — Assessment & Plan Note (Signed)
Notes that he had good relief from the steroid injection and no issues at this time.

## 2019-01-23 NOTE — Progress Notes (Signed)
  Subjective:  HPI: [name] is a 68 y.o. male who presents for f/u DM and HTN  Please see Assessment and Plan below for the status of his chronic medical problems.  Review of Systems: Review of Systems  Constitutional: Negative for chills, fever and malaise/fatigue.  Respiratory: Negative for cough and shortness of breath.   Cardiovascular: Negative for chest pain.  Genitourinary: Negative for dysuria.  Musculoskeletal: Negative for myalgias.  Neurological: Negative for dizziness.  Psychiatric/Behavioral: Negative for depression.    Objective:  Physical Exam: Vitals:   01/23/19 0934  BP: 105/65  Pulse: 94  Temp: 99.1 F (37.3 C)  TempSrc: Oral  SpO2: 100%  Weight: 154 lb (69.9 kg)  Height: 5\' 6"  (1.676 m)   Body mass index is 24.86 kg/m. Physical Exam Vitals signs and nursing note reviewed.  Cardiovascular:     Rate and Rhythm: Normal rate and regular rhythm.  Pulmonary:     Effort: Pulmonary effort is normal.     Breath sounds: Normal breath sounds. No wheezing or rales.  Musculoskeletal:     Right lower leg: No edema.     Left lower leg: No edema.    Assessment & Plan:  See Encounters Tab for problem based charting.  Medications Ordered Meds ordered this encounter  Medications  . liraglutide (VICTOZA) 18 MG/3ML SOPN    Sig: Inject 0.1 mLs (0.6 mg total) into the skin daily.    Dispense:  9 mL    Refill:  3  . Insulin Pen Needle 31G X 5 MM MISC    Sig: Use to inject insulin one time a day    Dispense:  100 each    Refill:  5    The patient is not insulin requiring, ICD 10 code E11.9. The patient tests 3 times per day.  . Accu-Chek FastClix Lancets MISC    Sig: Use to check blood sugar 1 time a day    Dispense:  102 each    Refill:  3    The patient is not insulin requiring, ICD 10 code E11.4. The patient tests 1 times per day.  Marland Kitchen atorvastatin (LIPITOR) 80 MG tablet    Sig: TAKE 1 TABLET BY MOUTH ONCE DAILY AT 6 PM.    Dispense:  90 tablet   Refill:  3  . carvedilol (COREG) 25 MG tablet    Sig: Take 1 tablet (25 mg total) by mouth 2 (two) times daily.    Dispense:  180 tablet    Refill:  3  . amLODipine (NORVASC) 5 MG tablet    Sig: Take 1 tablet (5 mg total) by mouth daily.    Dispense:  90 tablet    Refill:  3  . furosemide (LASIX) 20 MG tablet    Sig: Take 1 tablet (20 mg total) by mouth 2 (two) times daily.    Dispense:  180 tablet    Refill:  1   Other Orders Orders Placed This Encounter  Procedures  . BMP8+Anion Gap  . CBC with Diff  . POC Hbg A1C   Follow Up: Return in about 6 months (around 07/26/2019).

## 2019-01-23 NOTE — Assessment & Plan Note (Addendum)
Chronic and stable we will recheck BMP

## 2019-01-23 NOTE — Assessment & Plan Note (Signed)
HPI: He brings his meter he checks his blood sugar at least in the morning religiously and sometimes other times a day.  His sugars are well controlled in the 100s to the 160s every morning.  A1c is also well controlled historically.  He is taking just Victoza 0.6 mg daily  Assessment well-controlled type 2 diabetes  Plan Continue Victoza 0.6 mg daily

## 2019-01-23 NOTE — Assessment & Plan Note (Signed)
HPI: He reports he is doing well he has no issues.  His painful gynecomastia resolved with change of spironolactone to eplerenone.  Assessment essential hypertension well-controlled  Plan Continue amlodipine 5 mg daily Continue carvedilol 25 mg twice daily Continue eplerenone 25 mg daily Continue Entresto twice daily Continue Lasix 20 mg twice daily

## 2019-01-24 LAB — BMP8+ANION GAP
Anion Gap: 19 mmol/L — ABNORMAL HIGH (ref 10.0–18.0)
BUN/Creatinine Ratio: 10 (ref 10–24)
BUN: 14 mg/dL (ref 8–27)
CO2: 22 mmol/L (ref 20–29)
Calcium: 9.3 mg/dL (ref 8.6–10.2)
Chloride: 101 mmol/L (ref 96–106)
Creatinine, Ser: 1.4 mg/dL — ABNORMAL HIGH (ref 0.76–1.27)
GFR calc Af Amer: 60 mL/min/{1.73_m2} (ref >59)
GFR calc non Af Amer: 52 mL/min/{1.73_m2} — ABNORMAL LOW (ref >59)
Glucose: 117 mg/dL — ABNORMAL HIGH (ref 65–99)
Potassium: 4.2 mmol/L (ref 3.5–5.2)
Sodium: 142 mmol/L (ref 134–144)

## 2019-01-24 LAB — CBC WITH DIFFERENTIAL/PLATELET
Basophils Absolute: 0 10*3/uL (ref 0.0–0.2)
Basos: 1 %
EOS (ABSOLUTE): 0.1 10*3/uL (ref 0.0–0.4)
Eos: 2 %
Hematocrit: 30.8 % — ABNORMAL LOW (ref 37.5–51.0)
Hemoglobin: 9.8 g/dL — ABNORMAL LOW (ref 13.0–17.7)
Immature Grans (Abs): 0 10*3/uL (ref 0.0–0.1)
Immature Granulocytes: 0 %
Lymphocytes Absolute: 1.5 10*3/uL (ref 0.7–3.1)
Lymphs: 25 %
MCH: 25.9 pg — ABNORMAL LOW (ref 26.6–33.0)
MCHC: 31.8 g/dL (ref 31.5–35.7)
MCV: 81 fL (ref 79–97)
Monocytes Absolute: 0.4 10*3/uL (ref 0.1–0.9)
Monocytes: 7 %
Neutrophils Absolute: 4.1 10*3/uL (ref 1.4–7.0)
Neutrophils: 65 %
Platelets: 252 10*3/uL (ref 150–450)
RBC: 3.79 x10E6/uL — ABNORMAL LOW (ref 4.14–5.80)
RDW: 12.3 % (ref 11.6–15.4)
WBC: 6.2 10*3/uL (ref 3.4–10.8)

## 2019-02-20 ENCOUNTER — Other Ambulatory Visit (HOSPITAL_COMMUNITY): Payer: Self-pay | Admitting: Internal Medicine

## 2019-02-20 MED FILL — AMLODIPINE BESYLATE 5 MG TA: 5 | 30 days supply | Qty: 30 | Fill #1

## 2019-02-20 MED FILL — ENTRESTO 97 MG-103 MG TAB: 97-103 | 30 days supply | Qty: 60 | Fill #0

## 2019-02-20 MED FILL — ATORVASTATIN 80 MG TABLET: 80 | 30 days supply | Qty: 30 | Fill #1

## 2019-02-20 MED FILL — FUROSEMIDE 20 MG TABS: 20 | 30 days supply | Qty: 60 | Fill #1

## 2019-03-05 ENCOUNTER — Other Ambulatory Visit: Payer: Self-pay

## 2019-03-05 ENCOUNTER — Encounter (HOSPITAL_COMMUNITY): Payer: Self-pay | Admitting: Internal Medicine

## 2019-03-05 ENCOUNTER — Ambulatory Visit (HOSPITAL_COMMUNITY)
Admission: RE | Admit: 2019-03-05 | Discharge: 2019-03-05 | Disposition: A | Discharge status: Home / Self Care | Payer: Medicare Other | Source: Ambulatory Visit | Attending: Internal Medicine | Admitting: Internal Medicine

## 2019-03-05 VITALS — BP 108/70 | HR 96 | Wt 151.6 lb

## 2019-03-05 DIAGNOSIS — I252 Old myocardial infarction: Secondary | ICD-10-CM | POA: Diagnosis not present

## 2019-03-05 DIAGNOSIS — E119 Type 2 diabetes mellitus without complications: Secondary | ICD-10-CM | POA: Insufficient documentation

## 2019-03-05 DIAGNOSIS — I255 Ischemic cardiomyopathy: Secondary | ICD-10-CM | POA: Diagnosis not present

## 2019-03-05 DIAGNOSIS — H5462 Unqualified visual loss, left eye, normal vision right eye: Secondary | ICD-10-CM | POA: Diagnosis not present

## 2019-03-05 DIAGNOSIS — I2510 Atherosclerotic heart disease of native coronary artery without angina pectoris: Secondary | ICD-10-CM | POA: Insufficient documentation

## 2019-03-05 DIAGNOSIS — E1142 Type 2 diabetes mellitus with diabetic polyneuropathy: Secondary | ICD-10-CM

## 2019-03-05 DIAGNOSIS — Z8673 Personal history of transient ischemic attack (TIA), and cerebral infarction without residual deficits: Secondary | ICD-10-CM | POA: Diagnosis not present

## 2019-03-05 DIAGNOSIS — I5042 Chronic combined systolic (congestive) and diastolic (congestive) heart failure: Secondary | ICD-10-CM

## 2019-03-05 DIAGNOSIS — Z794 Long term (current) use of insulin: Secondary | ICD-10-CM | POA: Diagnosis not present

## 2019-03-05 DIAGNOSIS — Z955 Presence of coronary angioplasty implant and graft: Secondary | ICD-10-CM | POA: Diagnosis not present

## 2019-03-05 DIAGNOSIS — Z7902 Long term (current) use of antithrombotics/antiplatelets: Secondary | ICD-10-CM | POA: Diagnosis not present

## 2019-03-05 DIAGNOSIS — G4733 Obstructive sleep apnea (adult) (pediatric): Secondary | ICD-10-CM | POA: Insufficient documentation

## 2019-03-05 DIAGNOSIS — F17210 Nicotine dependence, cigarettes, uncomplicated: Secondary | ICD-10-CM | POA: Diagnosis not present

## 2019-03-05 DIAGNOSIS — Z79899 Other long term (current) drug therapy: Secondary | ICD-10-CM | POA: Diagnosis not present

## 2019-03-05 DIAGNOSIS — I110 Hypertensive heart disease with heart failure: Secondary | ICD-10-CM | POA: Insufficient documentation

## 2019-03-05 DIAGNOSIS — Z7982 Long term (current) use of aspirin: Secondary | ICD-10-CM | POA: Insufficient documentation

## 2019-03-05 DIAGNOSIS — I5022 Chronic systolic (congestive) heart failure: Secondary | ICD-10-CM | POA: Insufficient documentation

## 2019-03-05 DIAGNOSIS — Z720 Tobacco use: Secondary | ICD-10-CM

## 2019-03-05 MED ORDER — IVABRADINE HCL 5 MG PO TABS: 2.5000 mg | ORAL_TABLET | Freq: Two times a day (BID) | ORAL | 3 refills | Status: DC

## 2019-03-05 NOTE — Patient Instructions (Addendum)
You have a follow up in 3 months with Dr. Haroldine Laws and an Echo the same day before the appointment.  Your physician has requested that you have an echocardiogram. Echocardiography is a painless test that uses sound waves to create images of your heart. It provides your doctor with information about the size and shape of your heart and how well your heart's chambers and valves are working. This procedure takes approximately one hour. There are no restrictions for this procedure.   New Medication: Start Corlanor 2.5mg  (1 tablet ) two times a day.  At the [facility], you and your health needs are our priority. As part of our continuing mission to provide you with exceptional heart care, we have created designated Provider Care Teams. These Care Teams include your primary Cardiologist (physician) and Advanced Practice Providers (APPs- Physician Assistants and Nurse Practitioners) who all work together to provide you with the care you need, when you need it.   You may see any of the following providers on your designated Care Team at your next follow up: Marland Kitchen Dr Glori Bickers . Dr Loralie Champagne . Darrick Grinder, NP   Please be sure to bring in all your medications bottles to every appointment.

## 2019-03-05 NOTE — Progress Notes (Signed)
Advanced Heart Failure Clinic Note   PCP: Joni Reining  HPI:  Eddie [name] is a 68 y.o. male  with h/o DM2, CAD s/p NSTEMI with LAD stent in 3/17, CVA, chronic systolic HF with EF 70-62%.   Had multiple admissions in May and June 2017 in the setting of decompensated CHF and EColi bacteremia. Readmitted in June 2017 with acute respiratory failure requiring intubation in setting of severe HTN and ADHF. Treated with milrinone which was eventually weaned off.     He presents today for HF follow up. Here with his wife. Says he is getting R-sided CP over the last week. On the first day said he had it all day. Felt like an ache. No SOB, diaphoresis, n/v. Felt different from previous MI. Pain has now resolved. Able to do ADls and walk without problem. If he has to walk to far gets SOB and has to take a break. No swelling, orthopnea or PND.   Still smoking. Only about 5 cigs/week. Blood sugars well controlled  Echo 12/19 EF 30-35%  Echo 01/11/17: EF 40%.   RV ok.  ECHO 06/19/2016: EF 20-25%.Cannot exclude small  laminar apical thrombus. No mobile or protuberant thrombus is  seen. Echo 5/15 EF 15-20%  Review of systems complete and found to be negative unless listed in HPI.    Past Medical History:  Diagnosis Date  . Blindness of left eye   . CAD (coronary artery disease)    a. STEMI 09/2015 w/ DES to Prox LAD  . CVA (cerebral infarction)    a. 09/2015: acute small right frontal lobe infarct  . Diabetes ([place])   . Hypertension   . Ischemic cardiomyopathy   . OSA (obstructive sleep apnea) 07/06/2017   Mild OSA with AHI of 8.2/hr with hypoxemia as low as 76% now on CPAP at 11cm H2O.  . PUD (peptic ulcer disease)   . ST elevation myocardial infarction involving left anterior descending (LAD) coronary artery ([place]) 09/28/2015    Current Outpatient Medications  Medication Sig Dispense Refill  . Accu-Chek FastClix Lancets MISC Use to check blood sugar 1 time a day 102 each 3  .  Acetaminophen (TYLENOL) 325 MG CAPS Take 2 tablets by mouth every 4 (four) hours as needed.    Marland Kitchen albuterol (PROVENTIL HFA;VENTOLIN HFA) 108 (90 Base) MCG/ACT inhaler Inhale 2 puffs into the lungs every 6 (six) hours as needed for wheezing or shortness of breath. 1 Inhaler 2  . amLODipine (NORVASC) 5 MG tablet Take 1 tablet (5 mg total) by mouth daily. 90 tablet 3  . aspirin EC 81 MG tablet Take 1 tablet (81 mg total) by mouth daily. 90 tablet 3  . atorvastatin (LIPITOR) 80 MG tablet TAKE 1 TABLET BY MOUTH ONCE DAILY AT 6 PM. 90 tablet 3  . bimatoprost (LUMIGAN) 0.03 % ophthalmic solution Place 1 drop into the right eye at bedtime.    . Blood Glucose Monitoring Suppl (ACCU-CHEK GUIDE) w/Device KIT 1 kit by Does not apply route 2 (two) times daily. 1 kit 0  . brimonidine (ALPHAGAN) 0.2 % ophthalmic solution Apply to eye.    . carvedilol (COREG) 25 MG tablet Take 1 tablet (25 mg total) by mouth 2 (two) times daily. 180 tablet 3  . clopidogrel (PLAVIX) 75 MG tablet Take 1 tablet (75 mg total) by mouth daily. 90 tablet 3  . ENTRESTO 97-103 MG TAKE 1 TABLET BY MOUTH 2 TIMES DAILY. 60 tablet 0  . eplerenone (INSPRA) 25 MG  tablet Take 1 tablet (25 mg total) by mouth daily. 90 tablet 3  . furosemide (LASIX) 20 MG tablet Take 1 tablet (20 mg total) by mouth 2 (two) times daily. 180 tablet 1  . glucose blood (ACCU-CHEK GUIDE) test strip Use to check blood sugar 3 times daily 100 each 12  . Insulin Pen Needle 31G X 5 MM MISC Use to inject insulin one time a day 100 each 5  . liraglutide (VICTOZA) 18 MG/3ML SOPN Inject 0.1 mLs (0.6 mg total) into the skin daily. 9 mL 3  . metFORMIN (GLUCOPHAGE) 1000 MG tablet Take 1 tablet (1,000 mg total) by mouth 2 (two) times daily with a meal. 180 tablet 3  . nitroGLYCERIN (NITROSTAT) 0.4 MG SL tablet Use one pill every 5 minutes X 3 if needed for chest pain. Contact MD if pain does not improve 30 tablet 0   No current facility-administered medications for this encounter.      Allergies  Allergen Reactions  . Bidil [Isosorb Dinitrate-Hydralazine] Other (See Comments)    Patient became lightheaded with 1 tablet tid.       Social History   Socioeconomic History  . Marital status: Married    Spouse name: Not on file  . Number of children: Not on file  . Years of education: Not on file  . Highest education level: Not on file  Occupational History  . Occupation: Electrical engineer: [employer]  Social Needs  . Financial resource strain: Not on file  . Food insecurity    Worry: Not on file    Inability: Not on file  . Transportation needs    Medical: Not on file    Non-medical: Not on file  Tobacco Use  . Smoking status: Current Some Day Smoker    Types: Cigarettes  . Smokeless tobacco: Current User  . Tobacco comment: 2 cigs per day   Substance and Sexual Activity  . Alcohol use: No    Alcohol/week: 0.0 standard drinks  . Drug use: No  . Sexual activity: Not on file  Lifestyle  . Physical activity    Days per week: Not on file    Minutes per session: Not on file  . Stress: Not on file  Relationships  . Social Herbalist on phone: Not on file    Gets together: Not on file    Attends religious service: Not on file    Active member of club or organization: Not on file    Attends meetings of clubs or organizations: Not on file    Relationship status: Not on file  . Intimate partner violence    Fear of current or ex partner: Not on file    Emotionally abused: Not on file    Physically abused: Not on file    Forced sexual activity: Not on file  Other Topics Concern  . Not on file  Social History Narrative  . Not on file      Family History  Problem Relation Age of Onset  . Diabetes Mother   . Cancer Neg Hx     Vitals:   03/05/19 1455  BP: 108/70  Pulse: 96  SpO2: 96%  Weight: 68.8 kg (151 lb 9.6 oz)   Wt Readings from Last 3 Encounters:  03/05/19 68.8 kg (151 lb 9.6 oz)  01/23/19 69.9 kg (154 lb)   10/03/18 70.6 kg (155 lb 9.6 oz)     PHYSICAL  EXAM: General:  Well appearing. No resp difficulty HEENT: normal Neck: supple. no JVD. Carotids 2+ bilat; no bruits. No lymphadenopathy or thryomegaly appreciated. Cor: PMI nondisplaced. Regular. tachy. No rubs, gallops or murmurs. Lungs: clear with decreased BS throughout. No wheeze Abdomen: soft, nontender, nondistended. No hepatosplenomegaly. No bruits or masses. Good bowel sounds. Extremities: no cyanosis, clubbing, rash, edema Neuro: alert & orientedx3, cranial nerves grossly intact. moves all 4 extremities w/o difficulty. Affect pleasant   ASSESSMENT & PLAN: 1. Chronicsystolic CHF : ICM May 9412 EF 15-20% and 12/17 EF 25-30%. Echo 12/2016 EF improved 40% - Echo 12/19 EF 30-35%. Personally reviewed - NYHA II - Volume status OK but remains quite tachy despite high-dose b-blocker - Continue lasix 20 mg BID.  - Continue Entresto 97/103 mg BID. BMET today.  - Continue Coreg to 25 mg BID - Continue Spiro 25 mg daily - Intolerant to Bidil in the past with dizziness and headaches.  - Add Corlanor 2.5 bid - Long discussion about possible ICD. He will discuss with his wife and get back to [place].    2. CAD: s/p DES to proximal LAD in 09/2015  - No s/s ischemia - Continue ASA, Plavix and atorvastatin.   3. HTN - Blood pressure well controlled. Continue current regimen.  4. 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  - Continue ASA and Plavix.  - No change.   4. DMII - Per PCP. On metformin and Victoza  - Consider Jardiance  5. OSA - Using CPAP.    6. Tobacco use - Only smoking a few. Encouraged complete cessation   Glori Bickers, MD  3:26 PM

## 2019-03-06 ENCOUNTER — Telehealth (HOSPITAL_COMMUNITY): Payer: Self-pay | Admitting: Pharmacy Technician

## 2019-03-06 NOTE — Telephone Encounter (Signed)
Received notification from [employer] that prior authorization for Corlanor 5mg  is required.  PA submitted on CoverMyMeds Key  AHMAWECT Status is pending  Will continue to follow.  Charlann Boxer, CPhT

## 2019-03-07 MED FILL — CORLANOR 5 MG TABLET: 5 | 30 days supply | Qty: 30 | Fill #0

## 2019-03-12 ENCOUNTER — Telehealth (HOSPITAL_COMMUNITY): Payer: Self-pay | Admitting: Pharmacy Technician

## 2019-03-12 NOTE — Telephone Encounter (Signed)
Advanced Heart Failure Patient Advocate Encounter  Prior Authorization for Corlanor 5mg  has been approved.    PA# [id number] Effective dates: 03/06/2019 through 07/24/2019  Patients co-pay is $3.90  Confirmed patient has picked up medication recently from [facility].  Charlann Boxer, CPhT

## 2019-03-25 ENCOUNTER — Other Ambulatory Visit (HOSPITAL_COMMUNITY): Payer: Self-pay | Admitting: Internal Medicine

## 2019-03-25 MED FILL — AMLODIPINE BESYLATE 5 MG TA: 5 | 30 days supply | Qty: 30 | Fill #2

## 2019-03-25 MED FILL — metFORMIN HCL 1000 MG TABS: 1000 | 90 days supply | Qty: 180 | Fill #1

## 2019-03-25 MED FILL — ATORVASTATIN 80 MG TABLET: 80 | 30 days supply | Qty: 30 | Fill #2

## 2019-03-25 MED FILL — FUROSEMIDE 20 MG TABS: 20 | 30 days supply | Qty: 60 | Fill #2

## 2019-03-25 MED FILL — ENTRESTO 97 MG-103 MG TAB: 97-103 | 90 days supply | Qty: 180 | Fill #0

## 2019-03-26 ENCOUNTER — Other Ambulatory Visit (HOSPITAL_COMMUNITY): Payer: Self-pay | Admitting: Cardiology

## 2019-03-26 MED ORDER — IVABRADINE HCL 5 MG PO TABS: 5.0000 mg | ORAL_TABLET | Freq: Two times a day (BID) | ORAL | 3 refills | Status: DC

## 2019-03-26 NOTE — Telephone Encounter (Signed)
Call from patients pharmacy to report patient has not been taking corlanor 5 BID as opposed to what was ordered 2.5 BD  per Dr Haroldine Laws ok for patient to continue current dose Updated rx sent to pharmacy

## 2019-03-27 ENCOUNTER — Other Ambulatory Visit (HOSPITAL_COMMUNITY): Payer: Self-pay | Admitting: *Deleted

## 2019-03-27 ENCOUNTER — Telehealth (HOSPITAL_COMMUNITY): Payer: Self-pay | Admitting: Cardiology

## 2019-03-27 MED ORDER — IVABRADINE HCL 5 MG PO TABS: 2.5000 mg | ORAL_TABLET | Freq: Two times a day (BID) | ORAL | 3 refills | Status: DC

## 2019-03-27 MED FILL — CORLANOR 5 MG TABLET: 5 | 30 days supply | Qty: 30 | Fill #0

## 2019-03-27 NOTE — Telephone Encounter (Signed)
Opened in error

## 2019-04-11 DIAGNOSIS — H401133 Primary open-angle glaucoma, bilateral, severe stage: Secondary | ICD-10-CM | POA: Diagnosis not present

## 2019-04-23 MED FILL — CORLANOR 5 MG TABLET: 5 | 30 days supply | Qty: 30 | Fill #1

## 2019-04-23 MED FILL — ACCU-CHEK GUIDE TEST STRIP: 33 days supply | Qty: 100 | Fill #1

## 2019-04-23 MED FILL — DORZOLAMIDE-TIMOLOL EYE DRP: 22.3-6.8 | 50 days supply | Qty: 10 | Fill #0

## 2019-04-23 MED FILL — EPLERENONE 25 MG TABLET: 25 | 90 days supply | Qty: 90 | Fill #2

## 2019-04-23 MED FILL — CLOPIDOGREL 75 MG TABLET: 75 | 90 days supply | Qty: 90 | Fill #1

## 2019-04-23 MED FILL — FUROSEMIDE 20 MG TABS: 20 | 30 days supply | Qty: 60 | Fill #0

## 2019-04-23 MED FILL — AMLODIPINE BESYLATE 5 MG TA: 5 | 30 days supply | Qty: 30 | Fill #3

## 2019-04-23 MED FILL — VICTOZA 2-PAK 18 MG/3 ML PE: 18 | 60 days supply | Qty: 6 | Fill #4

## 2019-04-23 MED FILL — ACCU-CHEK FASTCLIX LANCETS: 90 days supply | Qty: 102 | Fill #1

## 2019-04-23 MED FILL — LATANOPROST 0.005% EYE DRP: 0.005 | 75 days supply | Qty: 8 | Fill #0

## 2019-04-24 ENCOUNTER — Other Ambulatory Visit: Payer: Self-pay | Admitting: *Deleted

## 2019-04-24 MED FILL — ATORVASTATIN 80 MG TABLET: 80 | 30 days supply | Qty: 30 | Fill #3

## 2019-04-24 NOTE — Telephone Encounter (Signed)
Fax from pharmacy with the following statement regarding refill for  Accu chek test strip: The patient has just received the last refill for this prescription.  This is an advance request to ensure uninterrupted availability of this prescription.  Will

## 2019-05-24 MED FILL — CORLANOR 5 MG TABLET: 5 | 30 days supply | Qty: 30 | Fill #2

## 2019-05-26 ENCOUNTER — Other Ambulatory Visit: Payer: Self-pay | Admitting: Internal Medicine

## 2019-05-26 ENCOUNTER — Other Ambulatory Visit (HOSPITAL_COMMUNITY): Payer: Self-pay | Admitting: Internal Medicine

## 2019-05-26 DIAGNOSIS — I2510 Atherosclerotic heart disease of native coronary artery without angina pectoris: Secondary | ICD-10-CM

## 2019-05-26 MED FILL — CARVEDILOL 25 MG TABLET: 25 | 90 days supply | Qty: 180 | Fill #1

## 2019-05-26 MED FILL — FUROSEMIDE 20 MG TABS: 20 | 30 days supply | Qty: 60 | Fill #1

## 2019-05-26 MED FILL — ATORVASTATIN 80 MG TABLET: 80 | 90 days supply | Qty: 90 | Fill #0

## 2019-05-27 ENCOUNTER — Other Ambulatory Visit (HOSPITAL_COMMUNITY): Payer: Self-pay | Admitting: Internal Medicine

## 2019-05-27 MED FILL — AMLODIPINE BESYLATE 5 MG TA: 5 | 30 days supply | Qty: 30 | Fill #0

## 2019-05-27 MED FILL — CORLANOR 5 MG TABLET: 5 | 30 days supply | Qty: 30 | Fill #2 | Status: TO

## 2019-05-29 ENCOUNTER — Other Ambulatory Visit: Payer: Self-pay | Admitting: Internal Medicine

## 2019-05-29 DIAGNOSIS — IMO0002 Reserved for concepts with insufficient information to code with codable children: Secondary | ICD-10-CM

## 2019-05-29 DIAGNOSIS — E1140 Type 2 diabetes mellitus with diabetic neuropathy, unspecified: Secondary | ICD-10-CM

## 2019-05-29 MED ORDER — INSULIN PEN NEEDLE 31G X 5 MM MISC: 5 refills | Status: DC

## 2019-05-29 MED FILL — UNIFINE PENTIPS 31GX3/16": 31G X 5 MM | 90 days supply | Qty: 100 | Fill #0

## 2019-05-29 MED FILL — UNIFINE PENTIPS 31GX3/16: 31G X 5 MM | 90 days supply | Qty: 100 | Fill #0

## 2019-05-29 NOTE — Telephone Encounter (Signed)
[facility] stated rx has expired, need new rx Thanks

## 2019-05-29 NOTE — Telephone Encounter (Signed)
Need refill on Insulin Pen Needle 31G X 5 MM MISC ;pt contact [facility], [place].

## 2019-06-05 ENCOUNTER — Other Ambulatory Visit: Payer: Self-pay

## 2019-06-05 ENCOUNTER — Ambulatory Visit (HOSPITAL_COMMUNITY)
Admission: RE | Admit: 2019-06-05 | Discharge: 2019-06-05 | Disposition: A | Discharge status: Home / Self Care | Payer: Medicare Other | Source: Ambulatory Visit | Attending: Internal Medicine | Admitting: Internal Medicine

## 2019-06-05 ENCOUNTER — Ambulatory Visit (HOSPITAL_BASED_OUTPATIENT_CLINIC_OR_DEPARTMENT_OTHER)
Admission: RE | Admit: 2019-06-05 | Discharge: 2019-06-05 | Disposition: A | Discharge status: Home / Self Care | Payer: Medicare Other | Source: Ambulatory Visit | Attending: Internal Medicine | Admitting: Internal Medicine

## 2019-06-05 ENCOUNTER — Encounter (HOSPITAL_COMMUNITY): Payer: Self-pay | Admitting: Internal Medicine

## 2019-06-05 VITALS — BP 133/55 | HR 76 | Wt 158.4 lb

## 2019-06-05 DIAGNOSIS — Z8673 Personal history of transient ischemic attack (TIA), and cerebral infarction without residual deficits: Secondary | ICD-10-CM | POA: Insufficient documentation

## 2019-06-05 DIAGNOSIS — I071 Rheumatic tricuspid insufficiency: Secondary | ICD-10-CM | POA: Diagnosis not present

## 2019-06-05 DIAGNOSIS — I10 Essential (primary) hypertension: Secondary | ICD-10-CM

## 2019-06-05 DIAGNOSIS — F17210 Nicotine dependence, cigarettes, uncomplicated: Secondary | ICD-10-CM | POA: Diagnosis not present

## 2019-06-05 DIAGNOSIS — I5042 Chronic combined systolic (congestive) and diastolic (congestive) heart failure: Secondary | ICD-10-CM

## 2019-06-05 DIAGNOSIS — I110 Hypertensive heart disease with heart failure: Secondary | ICD-10-CM | POA: Insufficient documentation

## 2019-06-05 DIAGNOSIS — E1142 Type 2 diabetes mellitus with diabetic polyneuropathy: Secondary | ICD-10-CM

## 2019-06-05 DIAGNOSIS — G4733 Obstructive sleep apnea (adult) (pediatric): Secondary | ICD-10-CM

## 2019-06-05 DIAGNOSIS — I313 Pericardial effusion (noninflammatory): Secondary | ICD-10-CM | POA: Insufficient documentation

## 2019-06-05 DIAGNOSIS — I2510 Atherosclerotic heart disease of native coronary artery without angina pectoris: Secondary | ICD-10-CM

## 2019-06-05 DIAGNOSIS — R0609 Other forms of dyspnea: Secondary | ICD-10-CM | POA: Insufficient documentation

## 2019-06-05 DIAGNOSIS — Z720 Tobacco use: Secondary | ICD-10-CM | POA: Diagnosis not present

## 2019-06-05 DIAGNOSIS — E118 Type 2 diabetes mellitus with unspecified complications: Secondary | ICD-10-CM | POA: Diagnosis not present

## 2019-06-05 DIAGNOSIS — Z794 Long term (current) use of insulin: Secondary | ICD-10-CM

## 2019-06-05 LAB — BASIC METABOLIC PANEL
Anion gap: 12 (ref 5–15)
BUN: 20 mg/dL (ref 8–23)
CO2: 27 mmol/L (ref 22–32)
Calcium: 8.9 mg/dL (ref 8.9–10.3)
Chloride: 101 mmol/L (ref 98–111)
Creatinine, Ser: 1.49 mg/dL — ABNORMAL HIGH (ref 0.61–1.24)
GFR calc Af Amer: 55 mL/min — ABNORMAL LOW (ref >60)
GFR calc non Af Amer: 48 mL/min — ABNORMAL LOW (ref >60)
Glucose, Bld: 100 mg/dL — ABNORMAL HIGH (ref 70–99)
Potassium: 3.5 mmol/L (ref 3.5–5.1)
Sodium: 140 mmol/L (ref 135–145)

## 2019-06-05 LAB — BRAIN NATRIURETIC PEPTIDE: B Natriuretic Peptide: 78.1 pg/mL (ref 0.0–100.0)

## 2019-06-05 MED ORDER — FUROSEMIDE 40 MG PO TABS: 40.0000 mg | ORAL_TABLET | Freq: Two times a day (BID) | ORAL | 3 refills | Status: DC

## 2019-06-05 MED ORDER — JARDIANCE 10 MG PO TABS: 10.0000 mg | ORAL_TABLET | Freq: Every day | ORAL | 6 refills | Status: DC

## 2019-06-05 MED FILL — JARDIANCE 10 MG TABLET: 10 | 30 days supply | Qty: 30 | Fill #0

## 2019-06-05 MED FILL — FUROSEMIDE 40 MG TAB: 40 | 30 days supply | Qty: 60 | Fill #0

## 2019-06-05 NOTE — Patient Instructions (Signed)
Increase Furosemide (Lasix) 40 mg Twice daily   Start Jardiance 10 mg daily  Labs done today, we will contact you for abnormal results  Your physician recommends that you schedule a follow-up appointment in: 1 week  If you have any questions or concerns before your next appointment please send [place] a message through [place] or call our office at [phone].  At the [facility], you and your health needs are our priority. As part of our continuing mission to provide you with exceptional heart care, we have created designated Provider Care Teams. These Care Teams include your primary Cardiologist (physician) and Advanced Practice Providers (APPs- Physician Assistants and Nurse Practitioners) who all work together to provide you with the care you need, when you need it.   You may see any of the following providers on your designated Care Team at your next follow up: Marland Kitchen Dr Glori Bickers . Dr Loralie Champagne . Darrick Grinder, NP . Lyda Jester, PA   Please be sure to bring in all your medications bottles to every appointment.

## 2019-06-05 NOTE — Progress Notes (Signed)
Advanced Heart Failure Clinic Note   PCP: Joni Reining  HPI:  Eddie [name] is a 68 y.o. male  with h/o DM2, CAD s/p NSTEMI with LAD stent in 3/17, CVA, chronic systolic HF with EF 88-89%.   Had multiple admissions in May and June 2017 in the setting of decompensated CHF and EColi bacteremia. Readmitted in June 2017 with acute respiratory failure requiring intubation in setting of severe HTN and ADHF. Treated with milrinone which was eventually weaned off.     He presents today for HF follow up. Here with his wife. Overall feels good. Says for the past 2 months when he wakes up he feels a heaviness in chest. He gets up and goes to the bathroom and it goes away. Does not have CP when he walks. Denies SOB, orthopnea or PND. Says weight is up and down.   Still smoking. Only about 3-5 cigs/week. Blood sugars well controlled  Echo today EF 40-45% Personally reviewed   Echo 12/19 EF 30-35%  Echo 01/11/17: EF 40%.   RV ok.  ECHO 06/19/2016: EF 20-25%.Cannot exclude small  laminar apical thrombus. No mobile or protuberant thrombus is  seen. Echo 5/15 EF 15-20%  Review of systems complete and found to be negative unless listed in HPI.    Past Medical History:  Diagnosis Date  . Blindness of left eye   . CAD (coronary artery disease)    a. STEMI 09/2015 w/ DES to Prox LAD  . CVA (cerebral infarction)    a. 09/2015: acute small right frontal lobe infarct  . Diabetes ([place])   . Hypertension   . Ischemic cardiomyopathy   . OSA (obstructive sleep apnea) 07/06/2017   Mild OSA with AHI of 8.2/hr with hypoxemia as low as 76% now on CPAP at 11cm H2O.  . PUD (peptic ulcer disease)   . ST elevation myocardial infarction involving left anterior descending (LAD) coronary artery ([place]) 09/28/2015    Current Outpatient Medications  Medication Sig Dispense Refill  . Accu-Chek FastClix Lancets MISC Use to check blood sugar 1 time a day 102 each 3  . Acetaminophen (TYLENOL) 325 MG CAPS Take 2  tablets by mouth every 4 (four) hours as needed.    Marland Kitchen albuterol (PROVENTIL HFA;VENTOLIN HFA) 108 (90 Base) MCG/ACT inhaler Inhale 2 puffs into the lungs every 6 (six) hours as needed for wheezing or shortness of breath. 1 Inhaler 2  . amLODipine (NORVASC) 5 MG tablet TAKE 1 TABLET (5 MG TOTAL) BY MOUTH DAILY. 30 tablet 3  . aspirin EC 81 MG tablet Take 1 tablet (81 mg total) by mouth daily. 90 tablet 3  . atorvastatin (LIPITOR) 80 MG tablet TAKE 1 TABLET BY MOUTH ONCE DAILY AT 6 PM. 90 tablet 3  . bimatoprost (LUMIGAN) 0.03 % ophthalmic solution Place 1 drop into the right eye at bedtime.    . Blood Glucose Monitoring Suppl (ACCU-CHEK GUIDE) w/Device KIT 1 kit by Does not apply route 2 (two) times daily. 1 kit 0  . brimonidine (ALPHAGAN) 0.2 % ophthalmic solution Apply to eye.    . carvedilol (COREG) 25 MG tablet Take 1 tablet (25 mg total) by mouth 2 (two) times daily. 180 tablet 3  . clopidogrel (PLAVIX) 75 MG tablet Take 1 tablet (75 mg total) by mouth daily. 90 tablet 3  . ENTRESTO 97-103 MG TAKE 1 TABLET BY MOUTH 2 TIMES DAILY. 180 tablet 3  . eplerenone (INSPRA) 25 MG tablet Take 1 tablet (25 mg total) by  mouth daily. 90 tablet 3  . furosemide (LASIX) 20 MG tablet Take 1 tablet (20 mg total) by mouth 2 (two) times daily. 180 tablet 1  . glucose blood (ACCU-CHEK GUIDE) test strip Use to check blood sugar 3 times daily 100 each 12  . Insulin Pen Needle 31G X 5 MM MISC Use to inject insulin one time a day 100 each 5  . ivabradine (CORLANOR) 5 MG TABS tablet Take 0.5 tablets (2.5 mg total) by mouth 2 (two) times daily with a meal. 30 tablet 3  . liraglutide (VICTOZA) 18 MG/3ML SOPN Inject 0.1 mLs (0.6 mg total) into the skin daily. 9 mL 3  . metFORMIN (GLUCOPHAGE) 1000 MG tablet Take 1 tablet (1,000 mg total) by mouth 2 (two) times daily with a meal. 180 tablet 3  . nitroGLYCERIN (NITROSTAT) 0.4 MG SL tablet Use one pill every 5 minutes X 3 if needed for chest pain. Contact MD if pain does not  improve 30 tablet 0   No current facility-administered medications for this encounter.     Allergies  Allergen Reactions  . Bidil [Isosorb Dinitrate-Hydralazine] Other (See Comments)    Patient became lightheaded with 1 tablet tid.       Social History   Socioeconomic History  . Marital status: Married    Spouse name: Not on file  . Number of children: Not on file  . Years of education: Not on file  . Highest education level: Not on file  Occupational History  . Occupation: Electrical engineer: [employer]  Social Needs  . Financial resource strain: Not on file  . Food insecurity    Worry: Not on file    Inability: Not on file  . Transportation needs    Medical: Not on file    Non-medical: Not on file  Tobacco Use  . Smoking status: Current Some Day Smoker    Types: Cigarettes  . Smokeless tobacco: Current User  . Tobacco comment: 2 cigs per day   Substance and Sexual Activity  . Alcohol use: No    Alcohol/week: 0.0 standard drinks  . Drug use: No  . Sexual activity: Not on file  Lifestyle  . Physical activity    Days per week: Not on file    Minutes per session: Not on file  . Stress: Not on file  Relationships  . Social Herbalist on phone: Not on file    Gets together: Not on file    Attends religious service: Not on file    Active member of club or organization: Not on file    Attends meetings of clubs or organizations: Not on file    Relationship status: Not on file  . Intimate partner violence    Fear of current or ex partner: Not on file    Emotionally abused: Not on file    Physically abused: Not on file    Forced sexual activity: Not on file  Other Topics Concern  . Not on file  Social History Narrative  . Not on file      Family History  Problem Relation Age of Onset  . Diabetes Mother   . Cancer Neg Hx     Vitals:   06/05/19 1406  BP: (!) 133/55  Pulse: 76  SpO2: 100%  Weight: 71.8 kg (158 lb 6.4 oz)   Wt  Readings from Last 3 Encounters:  06/05/19 71.8 kg (158 lb 6.4 oz)  03/05/19 68.8 kg (151 lb 9.6 oz)  01/23/19 69.9 kg (154 lb)     PHYSICAL EXAM: General:  Well appearing. No resp difficulty HEENT: normal Neck: supple. JVP to ear Carotids 2+ bilat; no bruits. No lymphadenopathy or thryomegaly appreciated. Cor: PMI nondisplaced. Regular rate & rhythm. 2/6 TR Lungs: clear Abdomen: soft, nontender, nondistended. No hepatosplenomegaly. No bruits or masses. Good bowel sounds. Extremities: no cyanosis, clubbing, rash, 1+ L>R  edema Neuro: alert & orientedx3, cranial nerves grossly intact. moves all 4 extremities w/o difficulty. Affect pleasant  ASSESSMENT & PLAN: 1. Chronicsystolic CHF : ICM May 1791 EF 15-20% and 12/17 EF 25-30%. Echo 12/2016 EF improved 40% - Echo 12/19 EF 30-35%. Personally reviewed - Echo today EF 40-45% Personally reviewed - Stable NYHA II - Volume status elevated - Increase lasix to 40 bid (from 20 bid).  - Continue Entresto 97/103 mg BID.  - Continue Coreg 25 mg BID - Continue Spiro 25 mg daily - Intolerant to Bidil in the past with dizziness and headaches.  - Continue Corlanor 2.5 bid. HR improved. Titrate as needed to get HR < 70 - Add Farxiga 10 - Long discussion about possible ICD in past and they wanted to defer. EF now > 35% so does not qualify.    2. CAD: s/p DES to proximal LAD in 09/2015  - No s/s ischemia - Continue ASA, Plavix and atorvastatin.   3. HTN -Blood pressure well controlled. Continue current regimen..  4. 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  - Continue DAPT   5. DMII - Per PCP. On metformin and Victoza  - Adding Farxiga  6. OSA - Using CPAP.   7.  Tobacco use - Only smoking a few per week. Encouraged cessation  Glori Bickers, MD  2:50 PM

## 2019-06-05 NOTE — Progress Notes (Signed)
  Echocardiogram 2D Echocardiogram has been performed.  Eddie [name] 06/05/2019, 1:44 PM

## 2019-06-10 MED FILL — ENTRESTO 97 MG-103 MG TAB: 97-103 | 90 days supply | Qty: 180 | Fill #1

## 2019-06-10 MED FILL — metFORMIN HCL 1000 MG TABS: 1000 | 90 days supply | Qty: 180 | Fill #2

## 2019-06-12 ENCOUNTER — Ambulatory Visit (HOSPITAL_COMMUNITY)
Admission: RE | Admit: 2019-06-12 | Discharge: 2019-06-12 | Disposition: A | Discharge status: Home / Self Care | Payer: Medicare Other | Source: Ambulatory Visit | Attending: Adult Health | Admitting: Adult Health

## 2019-06-12 ENCOUNTER — Encounter (HOSPITAL_COMMUNITY): Payer: Self-pay

## 2019-06-12 ENCOUNTER — Other Ambulatory Visit: Payer: Self-pay

## 2019-06-12 VITALS — BP 123/75 | HR 83 | Wt 158.0 lb

## 2019-06-12 DIAGNOSIS — I130 Hypertensive heart and chronic kidney disease with heart failure and stage 1 through stage 4 chronic kidney disease, or unspecified chronic kidney disease: Secondary | ICD-10-CM | POA: Insufficient documentation

## 2019-06-12 DIAGNOSIS — F17210 Nicotine dependence, cigarettes, uncomplicated: Secondary | ICD-10-CM | POA: Insufficient documentation

## 2019-06-12 DIAGNOSIS — Z955 Presence of coronary angioplasty implant and graft: Secondary | ICD-10-CM | POA: Insufficient documentation

## 2019-06-12 DIAGNOSIS — Z833 Family history of diabetes mellitus: Secondary | ICD-10-CM | POA: Diagnosis not present

## 2019-06-12 DIAGNOSIS — E1122 Type 2 diabetes mellitus with diabetic chronic kidney disease: Secondary | ICD-10-CM | POA: Insufficient documentation

## 2019-06-12 DIAGNOSIS — I252 Old myocardial infarction: Secondary | ICD-10-CM | POA: Diagnosis not present

## 2019-06-12 DIAGNOSIS — I10 Essential (primary) hypertension: Secondary | ICD-10-CM

## 2019-06-12 DIAGNOSIS — Z720 Tobacco use: Secondary | ICD-10-CM

## 2019-06-12 DIAGNOSIS — I5022 Chronic systolic (congestive) heart failure: Secondary | ICD-10-CM | POA: Diagnosis not present

## 2019-06-12 DIAGNOSIS — N1831 Chronic kidney disease, stage 3a: Secondary | ICD-10-CM | POA: Insufficient documentation

## 2019-06-12 DIAGNOSIS — Z794 Long term (current) use of insulin: Secondary | ICD-10-CM | POA: Diagnosis not present

## 2019-06-12 DIAGNOSIS — Z7902 Long term (current) use of antithrombotics/antiplatelets: Secondary | ICD-10-CM | POA: Insufficient documentation

## 2019-06-12 DIAGNOSIS — Z888 Allergy status to other drugs, medicaments and biological substances status: Secondary | ICD-10-CM | POA: Diagnosis not present

## 2019-06-12 DIAGNOSIS — I255 Ischemic cardiomyopathy: Secondary | ICD-10-CM | POA: Diagnosis not present

## 2019-06-12 DIAGNOSIS — I2510 Atherosclerotic heart disease of native coronary artery without angina pectoris: Secondary | ICD-10-CM | POA: Insufficient documentation

## 2019-06-12 DIAGNOSIS — I5042 Chronic combined systolic (congestive) and diastolic (congestive) heart failure: Secondary | ICD-10-CM

## 2019-06-12 DIAGNOSIS — Z79899 Other long term (current) drug therapy: Secondary | ICD-10-CM | POA: Insufficient documentation

## 2019-06-12 DIAGNOSIS — Z7982 Long term (current) use of aspirin: Secondary | ICD-10-CM | POA: Diagnosis not present

## 2019-06-12 DIAGNOSIS — Z8673 Personal history of transient ischemic attack (TIA), and cerebral infarction without residual deficits: Secondary | ICD-10-CM | POA: Insufficient documentation

## 2019-06-12 DIAGNOSIS — G4733 Obstructive sleep apnea (adult) (pediatric): Secondary | ICD-10-CM | POA: Diagnosis not present

## 2019-06-12 LAB — BASIC METABOLIC PANEL
Anion gap: 10 (ref 5–15)
BUN: 24 mg/dL — ABNORMAL HIGH (ref 8–23)
CO2: 27 mmol/L (ref 22–32)
Calcium: 9 mg/dL (ref 8.9–10.3)
Chloride: 103 mmol/L (ref 98–111)
Creatinine, Ser: 1.66 mg/dL — ABNORMAL HIGH (ref 0.61–1.24)
GFR calc Af Amer: 48 mL/min — ABNORMAL LOW (ref >60)
GFR calc non Af Amer: 42 mL/min — ABNORMAL LOW (ref >60)
Glucose, Bld: 135 mg/dL — ABNORMAL HIGH (ref 70–99)
Potassium: 4.4 mmol/L (ref 3.5–5.1)
Sodium: 140 mmol/L (ref 135–145)

## 2019-06-12 NOTE — Progress Notes (Signed)
Advanced Heart Failure Clinic Note   PCP: Joni Reining HF MD: Dr Haroldine Laws  HPI: Eddie [name] is a 68 y.o. male  with h/o DM2, CAD s/p NSTEMI with LAD stent in 3/17, CVA, chronic systolic HF with EF 68-34%.   Had multiple admissions in May and June 2017 in the setting of decompensated CHF and EColi bacteremia. Readmitted in June 2017 with acute respiratory failure requiring intubation in setting of severe HTN and ADHF. Treated with milrinone which was eventually weaned off.     Today he returns for HF follow up. Last visit lasix was increased to 40 mg twice a day and farxiga was started. Overall feeling fine.SOB with moderate exertion. Denies PND/Orthopnea. No chest pain.  Appetite ok. No fever or chills. Weight at home 156-157  pounds. Taking all medications. Smoking 3 cigarettes a day.   Echo 05/2019 EF 40-45%  Echo 12/19 EF 30-35%  Echo 01/11/17: EF 40%.   RV ok.  ECHO 06/19/2016: EF 20-25%.Cannot exclude small  laminar apical thrombus. No mobile or protuberant thrombus is  seen. Echo 5/15 EF 15-20%  Review of systems complete and found to be negative unless listed in HPI.    Past Medical History:  Diagnosis Date  . Blindness of left eye   . CAD (coronary artery disease)    a. STEMI 09/2015 w/ DES to Prox LAD  . CVA (cerebral infarction)    a. 09/2015: acute small right frontal lobe infarct  . Diabetes ([place])   . Hypertension   . Ischemic cardiomyopathy   . OSA (obstructive sleep apnea) 07/06/2017   Mild OSA with AHI of 8.2/hr with hypoxemia as low as 76% now on CPAP at 11cm H2O.  . PUD (peptic ulcer disease)   . ST elevation myocardial infarction involving left anterior descending (LAD) coronary artery ([place]) 09/28/2015    Current Outpatient Medications  Medication Sig Dispense Refill  . Accu-Chek FastClix Lancets MISC Use to check blood sugar 1 time a day 102 each 3  . Acetaminophen (TYLENOL) 325 MG CAPS Take 2 tablets by mouth every 4 (four) hours as needed.    Marland Kitchen  albuterol (PROVENTIL HFA;VENTOLIN HFA) 108 (90 Base) MCG/ACT inhaler Inhale 2 puffs into the lungs every 6 (six) hours as needed for wheezing or shortness of breath. 1 Inhaler 2  . amLODipine (NORVASC) 5 MG tablet TAKE 1 TABLET (5 MG TOTAL) BY MOUTH DAILY. 30 tablet 3  . aspirin EC 81 MG tablet Take 1 tablet (81 mg total) by mouth daily. 90 tablet 3  . atorvastatin (LIPITOR) 80 MG tablet TAKE 1 TABLET BY MOUTH ONCE DAILY AT 6 PM. 90 tablet 3  . bimatoprost (LUMIGAN) 0.03 % ophthalmic solution Place 1 drop into the right eye at bedtime.    . Blood Glucose Monitoring Suppl (ACCU-CHEK GUIDE) w/Device KIT 1 kit by Does not apply route 2 (two) times daily. 1 kit 0  . brimonidine (ALPHAGAN) 0.2 % ophthalmic solution Apply to eye.    . carvedilol (COREG) 25 MG tablet Take 1 tablet (25 mg total) by mouth 2 (two) times daily. 180 tablet 3  . clopidogrel (PLAVIX) 75 MG tablet Take 1 tablet (75 mg total) by mouth daily. 90 tablet 3  . empagliflozin (JARDIANCE) 10 MG TABS tablet Take 10 mg by mouth daily before breakfast. 30 tablet 6  . ENTRESTO 97-103 MG TAKE 1 TABLET BY MOUTH 2 TIMES DAILY. 180 tablet 3  . eplerenone (INSPRA) 25 MG tablet Take 1 tablet (25 mg  total) by mouth daily. 90 tablet 3  . furosemide (LASIX) 40 MG tablet Take 1 tablet (40 mg total) by mouth 2 (two) times daily. 60 tablet 3  . glucose blood (ACCU-CHEK GUIDE) test strip Use to check blood sugar 3 times daily 100 each 12  . Insulin Pen Needle 31G X 5 MM MISC Use to inject insulin one time a day 100 each 5  . ivabradine (CORLANOR) 5 MG TABS tablet Take 0.5 tablets (2.5 mg total) by mouth 2 (two) times daily with a meal. 30 tablet 3  . liraglutide (VICTOZA) 18 MG/3ML SOPN Inject 0.1 mLs (0.6 mg total) into the skin daily. 9 mL 3  . metFORMIN (GLUCOPHAGE) 1000 MG tablet Take 1 tablet (1,000 mg total) by mouth 2 (two) times daily with a meal. 180 tablet 3  . nitroGLYCERIN (NITROSTAT) 0.4 MG SL tablet Use one pill every 5 minutes X 3 if  needed for chest pain. Contact MD if pain does not improve 30 tablet 0   No current facility-administered medications for this encounter.     Allergies  Allergen Reactions  . Bidil [Isosorb Dinitrate-Hydralazine] Other (See Comments)    Patient became lightheaded with 1 tablet tid.       Social History   Socioeconomic History  . Marital status: Married    Spouse name: Not on file  . Number of children: Not on file  . Years of education: Not on file  . Highest education level: Not on file  Occupational History  . Occupation: Electrical engineer: [employer]  Social Needs  . Financial resource strain: Not on file  . Food insecurity    Worry: Not on file    Inability: Not on file  . Transportation needs    Medical: Not on file    Non-medical: Not on file  Tobacco Use  . Smoking status: Current Some Day Smoker    Types: Cigarettes  . Smokeless tobacco: Current User  . Tobacco comment: 2 cigs per day   Substance and Sexual Activity  . Alcohol use: No    Alcohol/week: 0.0 standard drinks  . Drug use: No  . Sexual activity: Not on file  Lifestyle  . Physical activity    Days per week: Not on file    Minutes per session: Not on file  . Stress: Not on file  Relationships  . Social Herbalist on phone: Not on file    Gets together: Not on file    Attends religious service: Not on file    Active member of club or organization: Not on file    Attends meetings of clubs or organizations: Not on file    Relationship status: Not on file  . Intimate partner violence    Fear of current or ex partner: Not on file    Emotionally abused: Not on file    Physically abused: Not on file    Forced sexual activity: Not on file  Other Topics Concern  . Not on file  Social History Narrative  . Not on file      Family History  Problem Relation Age of Onset  . Diabetes Mother   . Cancer Neg Hx     Vitals:   06/12/19 1332  BP: 123/75  Pulse: 83  SpO2:  100%  Weight: 71.7 kg (158 lb)   Wt Readings from Last 3 Encounters:  06/12/19 71.7 kg (158 lb)  06/05/19 71.8  kg (158 lb 6.4 oz)  03/05/19 68.8 kg (151 lb 9.6 oz)     PHYSICAL EXAM: General:  Well appearing. No resp difficulty HEENT: normal Neck: supple. no JVD. Carotids 2+ bilat; no bruits. No lymphadenopathy or thryomegaly appreciated. Cor: PMI nondisplaced. Regular rate & rhythm. No rubs, gallops or murmurs. Lungs: clear Abdomen: soft, nontender, nondistended. No hepatosplenomegaly. No bruits or masses. Good bowel sounds. Extremities: no cyanosis, clubbing, rash, edema Neuro: alert & orientedx3, cranial nerves grossly intact. moves all 4 extremities w/o difficulty. Affect pleasant   ASSESSMENT & PLAN: 1. Chronicsystolic CHF : ICM May 1245 EF 15-20% and 12/17 EF 25-30%. Echo 12/2016 EF improved 40% - Echo 12/19 EF 30-35%. Personally reviewed - Echo 11/2020EF 40-45% Personally reviewed NYHA II-III. Volume status stable.  - Continue  lasix to 40 bid  - Continue Entresto 97/103 mg BID.  - Continue Coreg 25 mg BID - Continue inspar 25 mg daily - Intolerant to Bidil in the past with dizziness and headaches.  - Continue Corlanor 2.5 bid.  - Continue jardiance 10 mg daily.  - Check BMET    2. CAD: s/p DES to proximal LAD in 09/2015  - No chest pain.  - Continue ASA, Plavix and atorvastatin.   3. HTN Stable.   4. 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  - Continue DAPT   5. DMII - Per PCP. On metformin and Victoza  - Continue jardiance 10 mg daily  6. OSA - Using CPAP.   7.  Tobacco use - Discussed smoking cessation.   8. CKD Stage IIIa  Check BMET today   Follow up in 3 months.   Darrick Grinder, NP  1:41 PM

## 2019-06-12 NOTE — Patient Instructions (Addendum)
Routine lab work today. Will notify you of abnormal results  Follow up in 3 months  

## 2019-06-24 MED FILL — AMLODIPINE BESYLATE 5 MG TA: 5 | 30 days supply | Qty: 30 | Fill #1

## 2019-06-24 MED FILL — CORLANOR 5 MG TABLET: 5 | 30 days supply | Qty: 30 | Fill #0

## 2019-07-03 MED FILL — JARDIANCE 10 MG TABLET: 10 | 30 days supply | Qty: 30 | Fill #1

## 2019-07-10 ENCOUNTER — Other Ambulatory Visit: Payer: Self-pay

## 2019-07-10 ENCOUNTER — Ambulatory Visit (INDEPENDENT_AMBULATORY_CARE_PROVIDER_SITE_OTHER): Payer: Medicare Other | Admitting: Dietician

## 2019-07-10 ENCOUNTER — Encounter: Payer: Self-pay | Admitting: Internal Medicine

## 2019-07-10 ENCOUNTER — Other Ambulatory Visit: Payer: Self-pay | Admitting: Dietician

## 2019-07-10 ENCOUNTER — Ambulatory Visit (INDEPENDENT_AMBULATORY_CARE_PROVIDER_SITE_OTHER): Payer: Medicare Other | Admitting: Internal Medicine

## 2019-07-10 VITALS — BP 107/62 | HR 84 | Temp 98.2°F | Ht 66.0 in | Wt 153.6 lb

## 2019-07-10 DIAGNOSIS — Z9989 Dependence on other enabling machines and devices: Secondary | ICD-10-CM

## 2019-07-10 DIAGNOSIS — Z23 Encounter for immunization: Secondary | ICD-10-CM | POA: Diagnosis not present

## 2019-07-10 DIAGNOSIS — G4733 Obstructive sleep apnea (adult) (pediatric): Secondary | ICD-10-CM

## 2019-07-10 DIAGNOSIS — E1140 Type 2 diabetes mellitus with diabetic neuropathy, unspecified: Secondary | ICD-10-CM

## 2019-07-10 DIAGNOSIS — I5042 Chronic combined systolic (congestive) and diastolic (congestive) heart failure: Secondary | ICD-10-CM

## 2019-07-10 DIAGNOSIS — R7989 Other specified abnormal findings of blood chemistry: Secondary | ICD-10-CM | POA: Diagnosis not present

## 2019-07-10 DIAGNOSIS — Z0000 Encounter for general adult medical examination without abnormal findings: Secondary | ICD-10-CM

## 2019-07-10 DIAGNOSIS — E1122 Type 2 diabetes mellitus with diabetic chronic kidney disease: Secondary | ICD-10-CM

## 2019-07-10 DIAGNOSIS — Z794 Long term (current) use of insulin: Secondary | ICD-10-CM

## 2019-07-10 DIAGNOSIS — Z7984 Long term (current) use of oral hypoglycemic drugs: Secondary | ICD-10-CM

## 2019-07-10 DIAGNOSIS — N1831 Chronic kidney disease, stage 3a: Secondary | ICD-10-CM

## 2019-07-10 DIAGNOSIS — I130 Hypertensive heart and chronic kidney disease with heart failure and stage 1 through stage 4 chronic kidney disease, or unspecified chronic kidney disease: Secondary | ICD-10-CM

## 2019-07-10 DIAGNOSIS — Z79899 Other long term (current) drug therapy: Secondary | ICD-10-CM

## 2019-07-10 DIAGNOSIS — E1142 Type 2 diabetes mellitus with diabetic polyneuropathy: Secondary | ICD-10-CM

## 2019-07-10 DIAGNOSIS — Z713 Dietary counseling and surveillance: Secondary | ICD-10-CM | POA: Diagnosis not present

## 2019-07-10 DIAGNOSIS — Z1211 Encounter for screening for malignant neoplasm of colon: Secondary | ICD-10-CM

## 2019-07-10 DIAGNOSIS — J439 Emphysema, unspecified: Secondary | ICD-10-CM

## 2019-07-10 LAB — GLUCOSE, CAPILLARY: Glucose-Capillary: 136 mg/dL — ABNORMAL HIGH (ref 70–99)

## 2019-07-10 LAB — POCT GLYCOSYLATED HEMOGLOBIN (HGB A1C): Hemoglobin A1C: 6.7 % — AB (ref 4.0–5.6)

## 2019-07-10 MED ORDER — ACCU-CHEK FASTCLIX LANCETS MISC: 3 refills | Status: DC

## 2019-07-10 MED ORDER — INSULIN PEN NEEDLE 32G X 4 MM MISC: 5 refills | Status: DC

## 2019-07-10 MED ORDER — ACCU-CHEK GUIDE W/DEVICE KIT: 1.0000 | PACK | Freq: Every day | 0 refills | Status: DC

## 2019-07-10 MED ORDER — ACCU-CHEK GUIDE VI STRP: ORAL_STRIP | 12 refills | Status: DC

## 2019-07-10 MED FILL — ACCU-CHEK FASTCLIX LANCETS: 90 days supply | Qty: 102 | Fill #0

## 2019-07-10 MED FILL — ACCU-CHEK GUIDE STRP: 90 days supply | Qty: 100 | Fill #0

## 2019-07-10 MED FILL — ACCU-CHEK GUIDE W/DEVICE KI: W/DEVICE | 90 days supply | Qty: 1 | Fill #0

## 2019-07-10 NOTE — Patient Instructions (Addendum)
   You are doing a good job caring for your diabetes.   Check your FEET Daily  Cut toenails as we discussed. Call if you ever have a sore or questions about your feet.   Consider going back to foot doctor if you are having trouble cutting your toenails.   Please make a follow up in 6 months (~ June 2021)  Call me anytime  Butch Penny [phone]

## 2019-07-10 NOTE — Assessment & Plan Note (Signed)
HPI: He has been having some trouble with his blood glucose meter he replaced the batteries but still does not seem to be working.  Thus he has been unable to check sugars he has not had any polyuria or polydipsia.  He has been adherent to 0.6 mg of Victoza daily and was recently started on empagliflozin by Dr. Jeffie Pollock.  Assessment type 2 diabetes controlled with diabetic neuropathy  Plan Continue Victoza 0.6 mg daily Continue Jardiance 10 mg daily

## 2019-07-10 NOTE — Assessment & Plan Note (Signed)
HPI: He reports that since his change in insurance he has been on CPAP supplies or that supplies are incompatable with his device.  He has a history of mild OSA with AHI of 8.2 that is supposed to be treated with CPAP therapy at 11 mm of water.  Assessment mild OSA  Plan We will reorder CPAP/supplies

## 2019-07-10 NOTE — Patient Instructions (Addendum)
I want you to get your shingles vaccine at the pharmacy, its called Shingrix.  For the COVID-19 vaccine, you will be eligible in the Group 2 administration, when you hear that group 2 vaccinations have started or are starting please give my office a call.

## 2019-07-10 NOTE — Progress Notes (Signed)
Diabetes Self-Management Education  Visit Type: 6 Month Follow-Up  Appt. Start Time: 1015 Appt. End Time: 1045  07/10/2019  [name], identified by name and date of birth, is a 68 y.o. male with a diagnosis of Diabetes:  Marland Kitchen Type 2 diabetes  ASSESSMENT  FOOD- has new dentures and is very happy with them, reports able to eat what he wants that includes fresh fruits and vegetables. He drinks mostly  water and half sweet and unsweet tea  EXERCISE- walks dog two times daily    MEDS- change to shorter pen needles for [place], educated about what diabetes medicines do and pinching up and possibly needing to angle injection to avoid and IM injection as well as site rotation for inejctions    MONITORING- provided with a new Accu chel GiudeMe meter today.    FEET- stopped going to podiatry, cut his own nails, foot inspection done today. Feet without visual problem except thick toenails. There is s scab where [name] says he cut himself when cutting toenails. He an dhis wife were educated about diabetes foot care, that it is recommended that person with diabetes who smoke should see a podiatrist, but if he elects to forgo seeing one, he was educated on importance of dial food inspection and how to safely cut thick toenails at home    EYES- [facility] July 2020- exam requested   BP Readings from Last 3 Encounters:  07/10/19 107/62  06/12/19 123/75  06/05/19 (!) 133/55    Diabetes Self-Management Education - 07/10/19 1000      Visit Information   Visit Type  6 Month Follow-Up      Health Coping   How would you rate your overall health?  Good      Pre-Education Assessment   Patient understands monitoring blood glucose, interpreting and using results  Needs Review      Patient Education   Monitoring  Taught/evaluated SMBG meter.;Daily foot exams      Individualized Goals (developed by patient)   Reducing Risk  do foot checks daily      Outcomes   Expected Outcomes   Demonstrated interest in learning. Expect positive outcomes    Future DMSE  6 months    Program Status  Completed      Subsequent Visit   Since your last visit have you continued or begun to take your medications as prescribed?  Yes    Since your last visit have you had your blood pressure checked?  Yes    Is your most recent blood pressure lower, unchanged, or higher since your last visit?  Lower    Since your last visit have you experienced any weight changes?  No change    Since your last visit, are you checking your blood glucose at least once a day?  No   having trouble with his meter      Individualized Plan for Diabetes Self-Management Training:   Learning Objective:  Patient will have a greater understanding of diabetes self-management. Patient education plan is to attend individual and/or group sessions per assessed needs and concerns.   Plan:   Patient Instructions    You are doing a good job caring for your diabetes.   Check your FEET Daily  Cut toenails as we discussed. Call if you ever have a sore or questions about your feet.   Consider going back to foot doctor if you are having trouble cutting your toenails.   Please make a follow up in  6 months (~ June 2021)  Call me anytime  Butch Penny [phone]   Expected Outcomes:  Demonstrated interest in learning. Expect positive outcomes  Education material provided: Diabetes Resources  If problems or questions, patient to contact team via:  Phone  Future DSME appointment: 6 months  Debera Lat, RD 07/10/2019 11:40 AM.

## 2019-07-10 NOTE — Progress Notes (Signed)
Subjective:  HPI: [name] is a 68 y.o. male who presents for f/u DM, CHF, HTN  Please see Assessment and Plan below for the status of his chronic medical problems.  Review of Systems: Review of Systems  Constitutional: Negative for fever and malaise/fatigue.  Respiratory: Negative for cough and shortness of breath.   Cardiovascular: Negative for chest pain.  Genitourinary: Negative for dysuria.  Neurological: Negative for dizziness.  Endo/Heme/Allergies: Negative for polydipsia.  Psychiatric/Behavioral: Negative for depression.    Objective:  Physical Exam: Vitals:   07/10/19 0921  BP: 107/62  Pulse: 84  Temp: 98.2 F (36.8 C)  TempSrc: Oral  SpO2: 100%  Weight: 153 lb 9.6 oz (69.7 kg)  Height: '5\' 6"'$  (1.676 m)   Body mass index is 24.79 kg/m. Physical Exam Vitals and nursing note reviewed.  Constitutional:      Appearance: Normal appearance.  Cardiovascular:     Rate and Rhythm: Normal rate and regular rhythm.  Pulmonary:     Effort: Pulmonary effort is normal.     Breath sounds: Normal breath sounds.  Abdominal:     General: Abdomen is flat.     Palpations: Abdomen is soft.  Musculoskeletal:     Right lower leg: No edema.     Left lower leg: No edema.  Neurological:     Mental Status: He is alert.  Psychiatric:        Mood and Affect: Mood normal.        Behavior: Behavior normal.    Assessment & Plan:  See Encounters Tab for problem based charting.  Medications Ordered Meds ordered this encounter  Medications  . Blood Glucose Monitoring Suppl (ACCU-CHEK GUIDE) w/Device KIT    Sig: 1 kit by Does not apply route daily.    Dispense:  1 kit    Refill:  0  . glucose blood (ACCU-CHEK GUIDE) test strip    Sig: Use to check blood sugar once daily    Dispense:  100 each    Refill:  12    The patient is insulin requiring, ICD 10 code E11.65. The patient tests 1 times per day.  . Accu-Chek FastClix Lancets MISC    Sig: Use to check blood sugar 1  time a day    Dispense:  102 each    Refill:  3    The patient is not insulin requiring, ICD 10 code E11.4. The patient tests 1 times per day.   Other Orders Orders Placed This Encounter  Procedures  . For home use only DME continuous positive airway pressure (CPAP)    Order Specific Question:   Length of Need    Answer:   Lifetime    Order Specific Question:   Patient has OSA or probable OSA    Answer:   Yes    Order Specific Question:   Is the patient currently using CPAP in the home    Answer:   Yes    Order Specific Question:   If yes (to question two)    Answer:   Determine DME provider and inform them of any new orders/settings    Order Specific Question:   Settings    Answer:   46-15    Order Specific Question:   CPAP supplies needed    Answer:   Mask, headgear, cushions, filters, heated tubing and water chamber  . Fecal occult blood, imunochemical    Standing Status:   Future    Standing Expiration Date:   07/09/2020  .  Microalbumin / Creatinine Urine Ratio  . TSH  . Lipid Profile  . POCT HgB A1C (CPT 83036)   Follow Up: Return 4-6 months.

## 2019-07-10 NOTE — Telephone Encounter (Signed)
Request shortest pen needles,  referral for diabetes self management.

## 2019-07-10 NOTE — Assessment & Plan Note (Signed)
HPI: He has had a mild increase in BUN and creatinine after the addition of empagliflozin.  Renal function overall appears stable.  Assessment CKD stage IIIa  Plan Somewhat expected creatinine rise with addition of SGLT2 inhibitor he is also on ARB with his entresto.

## 2019-07-10 NOTE — Assessment & Plan Note (Signed)
We discussed the findings from his insurance home health nurse and have placed order microalbumin, lipid panel and repeat TSH.  He did have an abnormal TSH a few years ago (I believe in the setting of ICU care)  1 additional topic we will need to explore further would be AAA screening he would qualify for his age and history of tobacco use.

## 2019-07-10 NOTE — Assessment & Plan Note (Addendum)
HPI: He saw Dr. Kae Heller last month he was noted to be hypervolemic and he was started on empagliflozin as well as his Lasix was increased to 40 mg twice daily.  He reports he is been doing better since that time and is tolerating the medications well.  Assessment chronic combined congestive heart failure  Plan Continue current medications

## 2019-07-10 NOTE — Assessment & Plan Note (Signed)
HPI: Overall he feels like he is doing well, infrequent use of albuterol.  Assessment COPD  Plan Continue albuterol as needed, flu shot today.

## 2019-07-11 LAB — LIPID PANEL
Chol/HDL Ratio: 3.4 ratio (ref 0.0–5.0)
Cholesterol, Total: 111 mg/dL (ref 100–199)
HDL: 33 mg/dL — ABNORMAL LOW (ref >39)
LDL Chol Calc (NIH): 61 mg/dL (ref 0–99)
Triglycerides: 89 mg/dL (ref 0–149)
VLDL Cholesterol Cal: 17 mg/dL (ref 5–40)

## 2019-07-11 LAB — MICROALBUMIN / CREATININE URINE RATIO
Creatinine, Urine: 61.4 mg/dL
Microalb/Creat Ratio: <5 mg/g creat (ref 0–29)
Microalbumin, Urine: <3 ug/mL

## 2019-07-11 LAB — TSH: TSH: 7.51 u[IU]/mL — ABNORMAL HIGH (ref 0.450–4.500)

## 2019-07-11 NOTE — Addendum Note (Signed)
Addended by: Joni Reining C on: 07/11/2019 10:05 AM   Modules accepted: Orders

## 2019-07-12 LAB — SPECIMEN STATUS REPORT

## 2019-07-12 LAB — T4, FREE: Free T4: 1.2 ng/dL (ref 0.82–1.77)

## 2019-07-15 ENCOUNTER — Telehealth: Payer: Self-pay | Admitting: Internal Medicine

## 2019-07-15 MED FILL — CLOPIDOGREL 75 MG TABLET: 75 | 90 days supply | Qty: 90 | Fill #2

## 2019-07-15 MED FILL — EPLERENONE 25 MG TABLET: 25 | 90 days supply | Qty: 90 | Fill #3

## 2019-07-15 MED FILL — FUROSEMIDE 40 MG TAB: 40 | 30 days supply | Qty: 60 | Fill #1

## 2019-07-15 NOTE — Telephone Encounter (Signed)
Patient thought Dr.Hoffman left him a message yesterday that he accidentally erased. He thinks it was about some kind of blood test results.

## 2019-07-15 NOTE — Telephone Encounter (Signed)
Pt is returning call, pls contact [phone]

## 2019-07-15 NOTE — Telephone Encounter (Signed)
I attempted to call him back (x2 but no answer) to let him know his bloodwork was fine, (one test was abnormal -TSH however the follow up test of Free T4 was normal so there is nothing to do about that except I will need to repeat the testing in a few months).

## 2019-07-16 NOTE — Telephone Encounter (Signed)
Left a message informing patient of Dr. Jodene Nam message and to call the office if he has questions.

## 2019-07-17 MED FILL — AMLODIPINE BESYLATE 5 MG TA: 5 | 30 days supply | Qty: 30 | Fill #2

## 2019-07-17 MED FILL — CORLANOR 5 MG TABLET: 5 | 30 days supply | Qty: 30 | Fill #1

## 2019-07-30 MED FILL — JARDIANCE 10 MG TABLET: 10 | 30 days supply | Qty: 30 | Fill #2

## 2019-08-15 MED FILL — ATORVASTATIN 80 MG TABLET: 80 | 90 days supply | Qty: 90 | Fill #1

## 2019-08-15 MED FILL — CARVEDILOL 25 MG TABLET: 25 | 90 days supply | Qty: 180 | Fill #2

## 2019-08-15 MED FILL — AMLODIPINE BESYLATE 5 MG TA: 5 | 30 days supply | Qty: 30 | Fill #3

## 2019-08-15 MED FILL — FUROSEMIDE 40 MG TAB: 40 | 30 days supply | Qty: 60 | Fill #2

## 2019-08-18 MED FILL — UNIFINE PENTIPS 31GX3/16: 31G X 5 MM | 90 days supply | Qty: 100 | Fill #1

## 2019-08-18 MED FILL — UNIFINE PENTIPS 31GX3/16": 31G X 5 MM | 90 days supply | Qty: 100 | Fill #1

## 2019-08-22 ENCOUNTER — Other Ambulatory Visit (HOSPITAL_COMMUNITY): Payer: Self-pay

## 2019-08-22 ENCOUNTER — Telehealth (HOSPITAL_COMMUNITY): Payer: Self-pay | Admitting: Pharmacist

## 2019-08-22 MED ORDER — IVABRADINE HCL 5 MG PO TABS: 2.5000 mg | ORAL_TABLET | Freq: Two times a day (BID) | ORAL | 3 refills | Status: DC

## 2019-08-22 NOTE — Telephone Encounter (Signed)
Advanced Heart Failure Patient Advocate Encounter  Prior Authorization for Corlanor has been approved.    PA# [id number] Effective dates: 08/22/19 through 07/23/20  Karle Plumber, PharmD, BCPS, BCCP, CPP Heart Failure Clinic Pharmacist [phone]

## 2019-08-22 NOTE — Telephone Encounter (Signed)
Patient Advocate Encounter   Received notification from [employer] that prior authorization for Corlanor is required.   PA submitted on CoverMyMeds Key [id number] Status is pending   Will continue to follow.  Karle Plumber, PharmD, BCPS, BCCP, CPP Heart Failure Clinic Pharmacist [phone]

## 2019-08-25 MED FILL — CORLANOR 5 MG TABLET: 5 | 30 days supply | Qty: 30 | Fill #0

## 2019-08-26 ENCOUNTER — Telehealth: Payer: Self-pay

## 2019-08-26 NOTE — Telephone Encounter (Signed)
Patient called in about his C-Pap supplies,he said he talked to someone at a company not sure of time and they said he could not get any supplies because of his insurance.The patient is not sure of the company or who he talked to.I asked how long he had his machine and he thinks it been 5 years or more but he needs it to work. I will send a message to [employer] to see if they can help [place].I see an order in the computer for this [place], [place] C2/2/20211:35 PM

## 2019-08-29 ENCOUNTER — Telehealth: Payer: Self-pay | Admitting: Dietician

## 2019-08-29 DIAGNOSIS — J9610 Chronic respiratory failure, unspecified whether with hypoxia or hypercapnia: Secondary | ICD-10-CM | POA: Diagnosis not present

## 2019-08-29 DIAGNOSIS — G4733 Obstructive sleep apnea (adult) (pediatric): Secondary | ICD-10-CM | POA: Diagnosis not present

## 2019-08-29 NOTE — Telephone Encounter (Signed)
Called Dr. Chase Caller office for most recent diabetes eye exam. They said that Dr. Wynelle Link is treating Eddie [name]' glaucoma only and not doing a diabetes eye exam. She informed me that he was most likely referred to them by another eye doctor who did a complete eye exam, but she was unable to tell me who.

## 2019-08-30 MED FILL — JARDIANCE 10 MG TABLET: 10 | 30 days supply | Qty: 30 | Fill #3

## 2019-09-01 NOTE — Telephone Encounter (Signed)
PAP Supplies Received: 6 days ago Message Contents  Eddie [name]  [place], [place], [place]; [place], [place], I looked into this and he is not able to get any PAP supplies/equipment from [employer] as he has a collection balance that has to be paid before we can provide any services to him.  I have asked our Resupply team to call him.

## 2019-09-05 ENCOUNTER — Encounter: Payer: Self-pay | Admitting: *Deleted

## 2019-09-05 NOTE — Progress Notes (Signed)
Received IFOBT kit in Lab on 09/05/19, specimen collected on 07/31/19 past protocol for testing.  Per Dr. Heber Braddock, test can wait until next visit, 01/01/20.  Joellyn Haff, PBT

## 2019-09-15 ENCOUNTER — Encounter (HOSPITAL_COMMUNITY): Payer: Self-pay

## 2019-09-15 ENCOUNTER — Ambulatory Visit (HOSPITAL_COMMUNITY)
Admission: RE | Admit: 2019-09-15 | Discharge: 2019-09-15 | Disposition: A | Discharge status: Home / Self Care | Payer: Medicare Other | Source: Ambulatory Visit | Attending: Adult Health | Admitting: Adult Health

## 2019-09-15 ENCOUNTER — Other Ambulatory Visit: Payer: Self-pay

## 2019-09-15 VITALS — BP 128/70 | HR 84 | Wt 152.8 lb

## 2019-09-15 DIAGNOSIS — I252 Old myocardial infarction: Secondary | ICD-10-CM | POA: Insufficient documentation

## 2019-09-15 DIAGNOSIS — Z955 Presence of coronary angioplasty implant and graft: Secondary | ICD-10-CM | POA: Insufficient documentation

## 2019-09-15 DIAGNOSIS — I5042 Chronic combined systolic (congestive) and diastolic (congestive) heart failure: Secondary | ICD-10-CM | POA: Diagnosis not present

## 2019-09-15 DIAGNOSIS — Z833 Family history of diabetes mellitus: Secondary | ICD-10-CM | POA: Diagnosis not present

## 2019-09-15 DIAGNOSIS — Z720 Tobacco use: Secondary | ICD-10-CM

## 2019-09-15 DIAGNOSIS — I130 Hypertensive heart and chronic kidney disease with heart failure and stage 1 through stage 4 chronic kidney disease, or unspecified chronic kidney disease: Secondary | ICD-10-CM | POA: Insufficient documentation

## 2019-09-15 DIAGNOSIS — Z888 Allergy status to other drugs, medicaments and biological substances status: Secondary | ICD-10-CM | POA: Insufficient documentation

## 2019-09-15 DIAGNOSIS — N1831 Chronic kidney disease, stage 3a: Secondary | ICD-10-CM | POA: Insufficient documentation

## 2019-09-15 DIAGNOSIS — Z7982 Long term (current) use of aspirin: Secondary | ICD-10-CM | POA: Insufficient documentation

## 2019-09-15 DIAGNOSIS — I255 Ischemic cardiomyopathy: Secondary | ICD-10-CM | POA: Diagnosis not present

## 2019-09-15 DIAGNOSIS — Z7902 Long term (current) use of antithrombotics/antiplatelets: Secondary | ICD-10-CM | POA: Diagnosis not present

## 2019-09-15 DIAGNOSIS — I5022 Chronic systolic (congestive) heart failure: Secondary | ICD-10-CM | POA: Diagnosis not present

## 2019-09-15 DIAGNOSIS — H5462 Unqualified visual loss, left eye, normal vision right eye: Secondary | ICD-10-CM | POA: Insufficient documentation

## 2019-09-15 DIAGNOSIS — G4733 Obstructive sleep apnea (adult) (pediatric): Secondary | ICD-10-CM | POA: Diagnosis not present

## 2019-09-15 DIAGNOSIS — Z8711 Personal history of peptic ulcer disease: Secondary | ICD-10-CM | POA: Insufficient documentation

## 2019-09-15 DIAGNOSIS — F17210 Nicotine dependence, cigarettes, uncomplicated: Secondary | ICD-10-CM | POA: Insufficient documentation

## 2019-09-15 DIAGNOSIS — I2510 Atherosclerotic heart disease of native coronary artery without angina pectoris: Secondary | ICD-10-CM | POA: Diagnosis not present

## 2019-09-15 DIAGNOSIS — E1122 Type 2 diabetes mellitus with diabetic chronic kidney disease: Secondary | ICD-10-CM | POA: Insufficient documentation

## 2019-09-15 DIAGNOSIS — Z8673 Personal history of transient ischemic attack (TIA), and cerebral infarction without residual deficits: Secondary | ICD-10-CM | POA: Diagnosis not present

## 2019-09-15 DIAGNOSIS — Z79899 Other long term (current) drug therapy: Secondary | ICD-10-CM | POA: Diagnosis not present

## 2019-09-15 DIAGNOSIS — Z794 Long term (current) use of insulin: Secondary | ICD-10-CM | POA: Insufficient documentation

## 2019-09-15 LAB — BASIC METABOLIC PANEL
Anion gap: 12 (ref 5–15)
BUN: 28 mg/dL — ABNORMAL HIGH (ref 8–23)
CO2: 24 mmol/L (ref 22–32)
Calcium: 9.1 mg/dL (ref 8.9–10.3)
Chloride: 103 mmol/L (ref 98–111)
Creatinine, Ser: 2.14 mg/dL — ABNORMAL HIGH (ref 0.61–1.24)
GFR calc Af Amer: 36 mL/min — ABNORMAL LOW (ref >60)
GFR calc non Af Amer: 31 mL/min — ABNORMAL LOW (ref >60)
Glucose, Bld: 256 mg/dL — ABNORMAL HIGH (ref 70–99)
Potassium: 3.9 mmol/L (ref 3.5–5.1)
Sodium: 139 mmol/L (ref 135–145)

## 2019-09-15 MED FILL — AMLODIPINE BESYLATE 5 MG TA: 5 | 90 days supply | Qty: 90 | Fill #0

## 2019-09-15 MED FILL — FUROSEMIDE 40 MG TAB: 40 | 30 days supply | Qty: 60 | Fill #3

## 2019-09-15 MED FILL — VICTOZA 18 MG/3 ML INJECT P: 18 | 90 days supply | Qty: 9 | Fill #2

## 2019-09-15 MED FILL — ENTRESTO 97 MG-103 MG TAB: 97-103 | 90 days supply | Qty: 180 | Fill #2

## 2019-09-15 MED FILL — metFORMIN HCL 1000 MG TABS: 1000 | 90 days supply | Qty: 180 | Fill #3

## 2019-09-15 NOTE — Patient Instructions (Signed)
Lab work done today. We will notify you of any abnormal lab work. No news is good news!  Please follow up with the [facility] Clinic in 6 months. We do not currently have that schedule so if you do not hear from [place] by June 2021 please call us at [phone] option #3 to make that appointment.  At the [facility], you and your health needs are our priority. As part of our continuing mission to provide you with exceptional heart care, we have created designated Provider Care Teams. These Care Teams include your primary Cardiologist (physician) and Advanced Practice Providers (APPs- Physician Assistants and Nurse Practitioners) who all work together to provide you with the care you need, when you need it.   You may see any of the following providers on your designated Care Team at your next follow up: Marland Kitchen Dr Arvilla Meres . Dr Marca Ancona . Tonye Becket, NP . Robbie Lis, PA . Karle Plumber, PharmD   Please be sure to bring in all your medications bottles to every appointment.

## 2019-09-15 NOTE — Addendum Note (Signed)
Encounter addended by: Sherald Hess, NP on: 09/15/2019 2:57 PM  Actions taken: Level of Service modified

## 2019-09-15 NOTE — Progress Notes (Signed)
Advanced Heart Failure Clinic Note   PCP: Joni Reining HF MD: Dr Haroldine Laws  HPI: Eddie [name] is a 69 y.o. male  with h/o DM2, CAD s/p NSTEMI with LAD stent in 3/17, CVA, chronic systolic HF with EF 57-01%.   Had multiple admissions in May and June 2017 in the setting of decompensated CHF and EColi bacteremia. Readmitted in June 2017 with acute respiratory failure requiring intubation in setting of severe HTN and ADHF. Treated with milrinone which was eventually weaned off.     Today he returns for HF follow up.Overall feeling better since he started using CPAP.  Denies SOB/PND/Orthopnea. Using CPAP every night.  Appetite ok. No fever or chills. Weight at home 153 pounds. Taking all medications. Smoking cigarettes every now and then.  Echo 05/2019 EF 40-45%  Echo 12/19 EF 30-35%  Echo 01/11/17: EF 40%.   RV ok.  ECHO 06/19/2016: EF 20-25%.Cannot exclude small  laminar apical thrombus. No mobile or protuberant thrombus is  seen. Echo 5/15 EF 15-20%  Review of systems complete and found to be negative unless listed in HPI.    Past Medical History:  Diagnosis Date  . Blindness of left eye   . CAD (coronary artery disease)    a. STEMI 09/2015 w/ DES to Prox LAD  . CVA (cerebral infarction)    a. 09/2015: acute small right frontal lobe infarct  . Diabetes ([place])   . Hypertension   . Ischemic cardiomyopathy   . OSA (obstructive sleep apnea) 07/06/2017   Mild OSA with AHI of 8.2/hr with hypoxemia as low as 76% now on CPAP at 11cm H2O.  . PUD (peptic ulcer disease)   . ST elevation myocardial infarction involving left anterior descending (LAD) coronary artery ([place]) 09/28/2015    Current Outpatient Medications  Medication Sig Dispense Refill  . Accu-Chek FastClix Lancets MISC Use to check blood sugar 1 time a day 102 each 3  . Acetaminophen (TYLENOL) 325 MG CAPS Take 2 tablets by mouth every 4 (four) hours as needed.    Marland Kitchen albuterol (PROVENTIL HFA;VENTOLIN HFA) 108 (90 Base)  MCG/ACT inhaler Inhale 2 puffs into the lungs every 6 (six) hours as needed for wheezing or shortness of breath. 1 Inhaler 2  . amLODipine (NORVASC) 5 MG tablet TAKE 1 TABLET (5 MG TOTAL) BY MOUTH DAILY. 30 tablet 3  . aspirin EC 81 MG tablet Take 1 tablet (81 mg total) by mouth daily. 90 tablet 3  . atorvastatin (LIPITOR) 80 MG tablet TAKE 1 TABLET BY MOUTH ONCE DAILY AT 6 PM. 90 tablet 3  . bimatoprost (LUMIGAN) 0.03 % ophthalmic solution Place 1 drop into the right eye at bedtime.    . Blood Glucose Monitoring Suppl (ACCU-CHEK GUIDE) w/Device KIT 1 kit by Does not apply route daily. 1 kit 0  . brimonidine (ALPHAGAN) 0.2 % ophthalmic solution Apply to eye.    . carvedilol (COREG) 25 MG tablet Take 1 tablet (25 mg total) by mouth 2 (two) times daily. 180 tablet 3  . clopidogrel (PLAVIX) 75 MG tablet Take 1 tablet (75 mg total) by mouth daily. 90 tablet 3  . empagliflozin (JARDIANCE) 10 MG TABS tablet Take 10 mg by mouth daily before breakfast. 30 tablet 6  . ENTRESTO 97-103 MG TAKE 1 TABLET BY MOUTH 2 TIMES DAILY. 180 tablet 3  . eplerenone (INSPRA) 25 MG tablet Take 1 tablet (25 mg total) by mouth daily. 90 tablet 3  . furosemide (LASIX) 40 MG tablet Take 1  tablet (40 mg total) by mouth 2 (two) times daily. 60 tablet 3  . glucose blood (ACCU-CHEK GUIDE) test strip Use to check blood sugar once daily 100 each 12  . Insulin Pen Needle 32G X 4 MM MISC Use to inject insulin one time a day 100 each 5  . ivabradine (CORLANOR) 5 MG TABS tablet Take 0.5 tablets (2.5 mg total) by mouth 2 (two) times daily with a meal. 30 tablet 3  . liraglutide (VICTOZA) 18 MG/3ML SOPN Inject 0.1 mLs (0.6 mg total) into the skin daily. 9 mL 3  . metFORMIN (GLUCOPHAGE) 1000 MG tablet Take 1 tablet (1,000 mg total) by mouth 2 (two) times daily with a meal. 180 tablet 3  . nitroGLYCERIN (NITROSTAT) 0.4 MG SL tablet Use one pill every 5 minutes X 3 if needed for chest pain. Contact MD if pain does not improve 30 tablet 0    No current facility-administered medications for this encounter.    Allergies  Allergen Reactions  . Bidil [Isosorb Dinitrate-Hydralazine] Other (See Comments)    Patient became lightheaded with 1 tablet tid.       Social History   Socioeconomic History  . Marital status: Married    Spouse name: Not on file  . Number of children: Not on file  . Years of education: Not on file  . Highest education level: Not on file  Occupational History  . Occupation: Electrical engineer: [employer]  Tobacco Use  . Smoking status: Current Some Day Smoker    Packs/day: 0.20    Types: Cigarettes  . Smokeless tobacco: Never Used  Substance and Sexual Activity  . Alcohol use: No    Alcohol/week: 0.0 standard drinks  . Drug use: No  . Sexual activity: Not on file  Other Topics Concern  . Not on file  Social History Narrative  . Not on file   Social Determinants of Health   Financial Resource Strain:   . Difficulty of Paying Living Expenses: Not on file  Food Insecurity:   . Worried About Charity fundraiser in the Last Year: Not on file  . Ran Out of Food in the Last Year: Not on file  Transportation Needs:   . Lack of Transportation (Medical): Not on file  . Lack of Transportation (Non-Medical): Not on file  Physical Activity:   . Days of Exercise per Week: Not on file  . Minutes of Exercise per Session: Not on file  Stress:   . Feeling of Stress : Not on file  Social Connections:   . Frequency of Communication with Friends and Family: Not on file  . Frequency of Social Gatherings with Friends and Family: Not on file  . Attends Religious Services: Not on file  . Active Member of Clubs or Organizations: Not on file  . Attends Archivist Meetings: Not on file  . Marital Status: Not on file  Intimate Partner Violence:   . Fear of Current or Ex-Partner: Not on file  . Emotionally Abused: Not on file  . Physically Abused: Not on file  . Sexually Abused:  Not on file      Family History  Problem Relation Age of Onset  . Diabetes Mother   . Cancer Neg Hx     Vitals:   09/15/19 1413  BP: 128/70  Pulse: 84  Weight: 69.3 kg (152 lb 12.8 oz)   Wt Readings from Last 3 Encounters:  09/15/19  69.3 kg (152 lb 12.8 oz)  07/10/19 69.7 kg (153 lb 9.6 oz)  06/12/19 71.7 kg (158 lb)     PHYSICAL EXAM: General:  Well appearing. No resp difficulty HEENT: normal Neck: supple. no JVD. Carotids 2+ bilat; no bruits. No lymphadenopathy or thryomegaly appreciated. Cor: PMI nondisplaced. Regular rate & rhythm. No rubs, gallops or murmurs. Lungs: clear Abdomen: soft, nontender, nondistended. No hepatosplenomegaly. No bruits or masses. Good bowel sounds. Extremities: no cyanosis, clubbing, rash, edema Neuro: alert & orientedx3, cranial nerves grossly intact. moves all 4 extremities w/o difficulty. Affect pleasant  ASSESSMENT & PLAN: 1. Chronicsystolic CHF : ICM May 9244 EF 15-20% and 12/17 EF 25-30%. Echo 12/2016 EF improved 40% - Echo 12/19 EF 30-35%. Personally reviewed - Echo 11/2020EF 40-45% Personally reviewed  - NYHA II-III - Volume status stable. Continue  lasix to 40 bid  - Continue Entresto 97/103 mg BID.  - Continue Coreg 25 mg BID - Continue inspar 25 mg daily - Intolerant to Bidil in the past with dizziness and headaches.  - Continue Corlanor 2.5 bid.  - Continue jardiance 10 mg daily.  - Check BMET    2. CAD: s/p DES to proximal LAD in 09/2015  - No chest pain.  - Continue ASA, Plavix and atorvastatin.   3. HTN Stable.   4. 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  - Continue DAPT   5. DMII - Per PCP. On metformin and Victoza  - Continue jardiance 10 mg daily  6. OSA  -Continue CPAP every night. .   7.  Tobacco use - Discussed smoking cessation.   8. CKD Stage IIIa  Check BMET today   Follow up in 6 months with Dr Haroldine Laws   Darrick Grinder, NP  2:15 PM

## 2019-09-17 ENCOUNTER — Telehealth (HOSPITAL_COMMUNITY): Payer: Self-pay

## 2019-09-17 NOTE — Telephone Encounter (Signed)
Called pt to give lab results. No answer. lmom

## 2019-09-17 NOTE — Telephone Encounter (Signed)
-----   Message from Sherald Hess, NP sent at 09/16/2019 11:17 AM EST ----- Renal function elevated. Please call. Hold lasix x 2 days then start lasix 40 mg daily.

## 2019-09-18 ENCOUNTER — Telehealth (HOSPITAL_COMMUNITY): Payer: Self-pay | Admitting: *Deleted

## 2019-09-18 MED ORDER — FUROSEMIDE 40 MG PO TABS: 40.0000 mg | ORAL_TABLET | Freq: Every day | ORAL | 3 refills | Status: DC

## 2019-09-18 NOTE — Telephone Encounter (Signed)
-----   Message from Amy D Clegg, NP sent at 09/16/2019 11:17 AM EST ----- Renal function elevated. Please call. Hold lasix x 2 days then start lasix 40 mg daily.  

## 2019-09-18 NOTE — Telephone Encounter (Signed)
Eddie [name], [place]  09/18/2019 4:08 PM EST    Pts wife returned call she is aware and agreeable with plan.    Nicole Cella, RN  09/17/2019 5:01 PM EST    Called pt to give lab results. No answer. lmom   Sherald Hess, NP  09/16/2019 11:17 AM EST    Renal function elevated. Please call. Hold lasix x 2 days then start lasix 40 mg daily.

## 2019-09-20 MED FILL — CORLANOR 5 MG TABLET: 5 | 30 days supply | Qty: 30 | Fill #1

## 2019-09-30 MED FILL — JARDIANCE 10 MG TABLET: 10 | 30 days supply | Qty: 30 | Fill #4

## 2019-10-22 ENCOUNTER — Other Ambulatory Visit (HOSPITAL_COMMUNITY): Payer: Self-pay | Admitting: Internal Medicine

## 2019-10-22 MED FILL — CORLANOR 5 MG TABLET: 5 | 30 days supply | Qty: 30 | Fill #2

## 2019-10-22 MED FILL — CLOPIDOGREL 75 MG TABLET: 75 | 90 days supply | Qty: 90 | Fill #0

## 2019-10-23 ENCOUNTER — Other Ambulatory Visit: Payer: Self-pay | Admitting: *Deleted

## 2019-10-23 MED FILL — JARDIANCE 10 MG TABLET: 10 | 30 days supply | Qty: 30 | Fill #5

## 2019-10-27 MED ORDER — EPLERENONE 25 MG PO TABS: 25.0000 mg | ORAL_TABLET | Freq: Every day | ORAL | 3 refills | Status: DC

## 2019-11-06 MED FILL — UNIFINE PENTIPS 31GX3/16: 31G X 5 MM | 90 days supply | Qty: 100 | Fill #2

## 2019-11-06 MED FILL — LATANOPROST 0.005% EYE DRP: 0.005 | 75 days supply | Qty: 8 | Fill #1

## 2019-11-06 MED FILL — DORZOLAMIDE-TIMOLOL EYE DRP: 22.3-6.8 | 50 days supply | Qty: 10 | Fill #1

## 2019-11-07 MED FILL — ACCU-CHEK GUIDE STRP: 90 days supply | Qty: 100 | Fill #1

## 2019-11-13 ENCOUNTER — Other Ambulatory Visit (HOSPITAL_COMMUNITY): Payer: Self-pay | Admitting: Internal Medicine

## 2019-11-13 MED FILL — ATORVASTATIN 80 MG TABLET: 80 | 90 days supply | Qty: 90 | Fill #2

## 2019-11-13 MED FILL — CARVEDILOL 25 MG TABLET: 25 | 90 days supply | Qty: 180 | Fill #3

## 2019-11-13 MED FILL — FUROSEMIDE 40 MG TAB: 40 | 30 days supply | Qty: 60 | Fill #0

## 2019-11-24 MED FILL — CORLANOR 5 MG TABLET: 5 | 30 days supply | Qty: 30 | Fill #3

## 2019-11-24 MED FILL — JARDIANCE 10 MG TABLET: 10 | 30 days supply | Qty: 30 | Fill #6

## 2019-12-02 NOTE — Progress Notes (Signed)
Virtual Visit via Telephone Note   This visit type was conducted due to national recommendations for restrictions regarding the COVID-19 Pandemic (e.g. social distancing) in an effort to limit this patient's exposure and mitigate transmission in our community.  Due to his co-morbid illnesses, this patient is at least at moderate risk for complications without adequate follow up.  This format is felt to be most appropriate for this patient at this time.  The patient did not have access to video technology/had technical difficulties with video requiring transitioning to audio format only (telephone).  All issues noted in this document were discussed and addressed.  No physical exam could be performed with this format.  Please refer to the patient's chart for his  consent to telehealth for [facility].   Evaluation Performed:  Follow-up visit  This visit type was conducted due to national recommendations for restrictions regarding the COVID-19 Pandemic (e.g. social distancing).  This format is felt to be most appropriate for this patient at this time.  All issues noted in this document were discussed and addressed.  No physical exam was performed (except for noted visual exam findings with Video Visits).  Please refer to the patient's chart (MyChart message for video visits and phone note for telephone visits) for the patient's consent to telehealth for [facility].  Date:  12/03/2019   ID:  Marcella Dubs, DOB April 27, 1951, MRN [id number]  Patient Location:  Home  Provider location:   [place]  PCP:  Lucious Groves, DO  Cardiologist:  Glori Bickers, MD Electrophysiologist:  None   Chief Complaint:  OSA  History of Present Illness:    Eddie [name] is a 69 y.o. male who presents via audio/video conferencing for a telehealth visit today.    HASKEL DEWALT is a 69y.o. male with a hx of ASCAD, DM2, Chronic systolic HF who was referred for sleep study due to CHF and severe HTN. He  underwent PSG showing mild OSA with an AHI of 8.2/hr with oxygen desaturations as low as 76% with loud snoring.  He underwent CPAP titration to 11cm H2O.  He is doing well with his CPAP device and thinks that he has gotten used to it.  He says that the water chamber recently is not using up the H2O but he really has not had a dry mouth when he wakes up.  He tolerates the full face mask and feels the pressure is adequate.  Since going on CPAP he feels more rested in the am and has no significant daytime sleepiness on most days but there are some days when he has to take a nap.  He occasionally has some mouth dryness and runny nose.  He is snoring some when he uses the PAP device.   The patient does not have symptoms concerning for COVID-19 infection (fever, chills, cough, or new shortness of breath).   Prior CV studies:   The following studies were reviewed today:  PAP compliance download  Past Medical History:  Diagnosis Date  . Blindness of left eye   . CAD (coronary artery disease)    a. STEMI 09/2015 w/ DES to Prox LAD  . CVA (cerebral infarction)    a. 09/2015: acute small right frontal lobe infarct  . Diabetes ([place])   . Hypertension   . Ischemic cardiomyopathy   . OSA (obstructive sleep apnea) 07/06/2017   Mild OSA with AHI of 8.2/hr with hypoxemia as low as 76% now on CPAP at 11cm H2O.  Marland Kitchen  PUD (peptic ulcer disease)   . ST elevation myocardial infarction involving left anterior descending (LAD) coronary artery ([place]) 09/28/2015   Past Surgical History:  Procedure Laterality Date  . CARDIAC CATHETERIZATION N/A 09/28/2015   Procedure: Left Heart Cath and Coronary Angiography;  Surgeon: Lorretta Harp, MD;  Location: [place] [facility];  Service: Cardiovascular;  Laterality: N/A;  . CARDIAC CATHETERIZATION N/A 10/08/2015   Procedure: Left Heart Cath and Coronary Angiography;  Surgeon: Belva Crome, MD;  Location: [place] [facility];  Service: Cardiovascular;  Laterality: N/A;  .  CATARACT EXTRACTION Right   . Repair of Peptic Ulcer       Current Meds  Medication Sig  . Accu-Chek FastClix Lancets MISC Use to check blood sugar 1 time a day  . Acetaminophen (TYLENOL) 325 MG CAPS Take 2 tablets by mouth every 4 (four) hours as needed.  Marland Kitchen albuterol (PROVENTIL HFA;VENTOLIN HFA) 108 (90 Base) MCG/ACT inhaler Inhale 2 puffs into the lungs every 6 (six) hours as needed for wheezing or shortness of breath.  Marland Kitchen amLODipine (NORVASC) 5 MG tablet TAKE 1 TABLET (5 MG TOTAL) BY MOUTH DAILY.  Marland Kitchen aspirin EC 81 MG tablet Take 1 tablet (81 mg total) by mouth daily.  Marland Kitchen atorvastatin (LIPITOR) 80 MG tablet TAKE 1 TABLET BY MOUTH ONCE DAILY AT 6 PM.  . bimatoprost (LUMIGAN) 0.03 % ophthalmic solution Place 1 drop into the right eye at bedtime.  . Blood Glucose Monitoring Suppl (ACCU-CHEK GUIDE) w/Device KIT 1 kit by Does not apply route daily.  . brimonidine (ALPHAGAN) 0.2 % ophthalmic solution Apply to eye.  . carvedilol (COREG) 25 MG tablet Take 1 tablet (25 mg total) by mouth 2 (two) times daily.  . clopidogrel (PLAVIX) 75 MG tablet TAKE 1 TABLET (75 MG TOTAL) BY MOUTH DAILY.  Marland Kitchen empagliflozin (JARDIANCE) 10 MG TABS tablet Take 10 mg by mouth daily before breakfast.  . ENTRESTO 97-103 MG TAKE 1 TABLET BY MOUTH 2 TIMES DAILY.  Marland Kitchen eplerenone (INSPRA) 25 MG tablet Take 1 tablet (25 mg total) by mouth daily.  . furosemide (LASIX) 40 MG tablet TAKE 1 TABLET (40 MG TOTAL) BY MOUTH 2 (TWO) TIMES DAILY.  Marland Kitchen glucose blood (ACCU-CHEK GUIDE) test strip Use to check blood sugar once daily  . Insulin Pen Needle 32G X 4 MM MISC Use to inject insulin one time a day  . ivabradine (CORLANOR) 5 MG TABS tablet Take 0.5 tablets (2.5 mg total) by mouth 2 (two) times daily with a meal.  . liraglutide (VICTOZA) 18 MG/3ML SOPN Inject 0.1 mLs (0.6 mg total) into the skin daily.  . metFORMIN (GLUCOPHAGE) 1000 MG tablet Take 1 tablet (1,000 mg total) by mouth 2 (two) times daily with a meal.  . nitroGLYCERIN  (NITROSTAT) 0.4 MG SL tablet Use one pill every 5 minutes X 3 if needed for chest pain. Contact MD if pain does not improve     Allergies:   Bidil [isosorb dinitrate-hydralazine]   Social History   Tobacco Use  . Smoking status: Current Some Day Smoker    Packs/day: 0.20    Types: Cigarettes  . Smokeless tobacco: Never Used  Substance Use Topics  . Alcohol use: No    Alcohol/week: 0.0 standard drinks  . Drug use: No     Family Hx: The patient's family history includes Diabetes in his mother. There is no history of Cancer.  ROS:   Please see the history of present illness.     All other  systems reviewed and are negative.   Labs/Other Tests and Data Reviewed:    Recent Labs: 01/23/2019: Hemoglobin 9.8; Platelets 252 06/05/2019: B Natriuretic Peptide 78.1 07/10/2019: TSH 7.510 09/15/2019: BUN 28; Creatinine, Ser 2.14; Potassium 3.9; Sodium 139   Recent Lipid Panel Lab Results  Component Value Date/Time   CHOL 111 07/10/2019 10:11 AM   TRIG 89 07/10/2019 10:11 AM   HDL 33 (L) 07/10/2019 10:11 AM   CHOLHDL 3.4 07/10/2019 10:11 AM   CHOLHDL 7.2 10/05/2015 03:22 PM   LDLCALC 61 07/10/2019 10:11 AM    Wt Readings from Last 3 Encounters:  12/03/19 150 lb (68 kg)  09/15/19 152 lb 12.8 oz (69.3 kg)  07/10/19 153 lb 9.6 oz (69.7 kg)     Objective:    Vital Signs:  BP 119/69   Pulse 86   Ht '5\' 6"'$  (1.676 m)   Wt 150 lb (68 kg)   BMI 24.21 kg/m    ASSESSMENT & PLAN:    1. OSA - The patient is tolerating PAP therapy well without any problems. The PAP download was reviewed today and showed an AHI of 2.9/hr on 11 cm H2O with 57% compliance in using more than 4 hours nightly.  The patient has been using and benefiting from PAP use and will continue to benefit from therapy. I suspect that he is snoring still some because he is sleeping on his back.  I have encouraged him to use his device at least 5 hours nightly.    2.  HTN -BP controlled -continue Entresto 97-'103mg'$  BID,  Carvedilol '25mg'$  BID, Eplerenone '25mg'$  daily and amlodipine '5mg'$  daily   COVID-19 Education: The signs and symptoms of COVID-19 were discussed with the patient and how to seek care for testing (follow up with PCP or arrange E-visit).  The importance of social distancing was discussed today.  Patient Risk:   After full review of this patient's clinical status, I feel that they are at least moderate risk at this time.  Time:   Today, I have spent 20 minutes on telemedicine discussing medical problems including OSA< HTN and reviewing patient's chart including PAP compliance download.  Medication Adjustments/Labs and Tests Ordered: Current medicines are reviewed at length with the patient today.  Concerns regarding medicines are outlined above.  Tests Ordered: No orders of the defined types were placed in this encounter.  Medication Changes: No orders of the defined types were placed in this encounter.   Disposition:  Follow up in 1 year(s)  Signed, Fransico Him, MD  12/03/2019 9:05 AM    [place]

## 2019-12-03 ENCOUNTER — Telehealth (INDEPENDENT_AMBULATORY_CARE_PROVIDER_SITE_OTHER): Payer: Medicare Other | Admitting: Cardiology

## 2019-12-03 ENCOUNTER — Telehealth: Payer: Self-pay

## 2019-12-03 ENCOUNTER — Encounter: Payer: Self-pay | Admitting: Cardiology

## 2019-12-03 ENCOUNTER — Other Ambulatory Visit: Payer: Self-pay

## 2019-12-03 VITALS — BP 119/69 | HR 86 | Ht 66.0 in | Wt 150.0 lb

## 2019-12-03 DIAGNOSIS — I10 Essential (primary) hypertension: Secondary | ICD-10-CM | POA: Diagnosis not present

## 2019-12-03 DIAGNOSIS — G4733 Obstructive sleep apnea (adult) (pediatric): Secondary | ICD-10-CM

## 2019-12-03 NOTE — Telephone Encounter (Signed)
  Patient Consent for Virtual Visit    Eddie [name] has provided verbal consent on 12/03/2019 for a virtual visit (video or telephone).   CONSENT FOR VIRTUAL VISIT FOR:  Eddie [name]  By participating in this virtual visit I agree to the following:  I hereby voluntarily request, consent and authorize [facility] and its employed or contracted physicians, physician assistants, nurse practitioners or other licensed health care professionals (the Practitioner), to provide me with telemedicine health care services (the "Services") as deemed necessary by the treating Practitioner. I acknowledge and consent to receive the Services by the Practitioner via telemedicine. I understand that the telemedicine visit will involve communicating with the Practitioner through live audiovisual communication technology and the disclosure of certain medical information by electronic transmission. I acknowledge that I have been given the opportunity to request an in-person assessment or other available alternative prior to the telemedicine visit and am voluntarily participating in the telemedicine visit.  I understand that I have the right to withhold or withdraw my consent to the use of telemedicine in the course of my care at any time, without affecting my right to future care or treatment, and that the Practitioner or I may terminate the telemedicine visit at any time. I understand that I have the right to inspect all information obtained and/or recorded in the course of the telemedicine visit and may receive copies of available information for a reasonable fee.  I understand that some of the potential risks of receiving the Services via telemedicine include:  Marland Kitchen Delay or interruption in medical evaluation due to technological equipment failure or disruption; . Information transmitted may not be sufficient (e.g. poor resolution of images) to allow for appropriate medical decision making by the Practitioner; and/or  .  In rare instances, security protocols could fail, causing a breach of personal health information.  Furthermore, I acknowledge that it is my responsibility to provide information about my medical history, conditions and care that is complete and accurate to the best of my ability. I acknowledge that Practitioner's advice, recommendations, and/or decision may be based on factors not within their control, such as incomplete or inaccurate data provided by me or distortions of diagnostic images or specimens that may result from electronic transmissions. I understand that the practice of medicine is not an exact science and that Practitioner makes no warranties or guarantees regarding treatment outcomes. I acknowledge that a copy of this consent can be made available to me via my patient portal [facility] MyChart), or I can request a printed copy by calling the office of [facility].    I understand that my insurance will be billed for this visit.   I have read or had this consent read to me. . I understand the contents of this consent, which adequately explains the benefits and risks of the Services being provided via telemedicine.  . I have been provided ample opportunity to ask questions regarding this consent and the Services and have had my questions answered to my satisfaction. . I give my informed consent for the services to be provided through the use of telemedicine in my medical care

## 2019-12-15 ENCOUNTER — Other Ambulatory Visit (HOSPITAL_COMMUNITY): Payer: Self-pay | Admitting: Internal Medicine

## 2019-12-15 MED FILL — ENTRESTO 97 MG-103 MG TAB: 97-103 | 90 days supply | Qty: 180 | Fill #3

## 2019-12-15 MED FILL — VICTOZA 18 MG/3 ML INJECT P: 18 | 90 days supply | Qty: 9 | Fill #3

## 2019-12-15 MED FILL — AMLODIPINE BESYLATE 5 MG TA: 5 | 90 days supply | Qty: 90 | Fill #1

## 2019-12-16 ENCOUNTER — Other Ambulatory Visit (HOSPITAL_COMMUNITY): Payer: Self-pay | Admitting: Internal Medicine

## 2019-12-17 MED FILL — JARDIANCE 10 MG TABLET: 10 | 90 days supply | Qty: 90 | Fill #0

## 2019-12-17 MED FILL — CORLANOR 5 MG TABLET: 5 | 30 days supply | Qty: 30 | Fill #0 | Status: TO

## 2019-12-18 ENCOUNTER — Other Ambulatory Visit: Payer: Self-pay | Admitting: *Deleted

## 2019-12-18 ENCOUNTER — Other Ambulatory Visit (HOSPITAL_COMMUNITY): Payer: Self-pay | Admitting: Internal Medicine

## 2019-12-18 DIAGNOSIS — E118 Type 2 diabetes mellitus with unspecified complications: Secondary | ICD-10-CM

## 2019-12-18 MED ORDER — METFORMIN HCL 1000 MG PO TABS: 1000.0000 mg | ORAL_TABLET | Freq: Two times a day (BID) | ORAL | 3 refills | Status: DC

## 2019-12-18 MED FILL — metFORMIN HCL 1000 MG TABS: 1000 | 90 days supply | Qty: 180 | Fill #0

## 2019-12-18 NOTE — Telephone Encounter (Signed)
Next appt scheduled 6/10 with PCP. 

## 2019-12-26 MED FILL — ACCU-CHEK FASTCLIX LANCETS: 90 days supply | Qty: 102 | Fill #2

## 2020-01-01 ENCOUNTER — Encounter: Payer: Self-pay | Admitting: Internal Medicine

## 2020-01-01 ENCOUNTER — Ambulatory Visit (INDEPENDENT_AMBULATORY_CARE_PROVIDER_SITE_OTHER): Payer: Medicare Other | Admitting: Internal Medicine

## 2020-01-01 ENCOUNTER — Ambulatory Visit: Payer: Medicare Other | Admitting: Dietician

## 2020-01-01 ENCOUNTER — Other Ambulatory Visit: Payer: Self-pay

## 2020-01-01 VITALS — BP 91/54 | HR 86 | Temp 98.1°F | Ht 66.0 in | Wt 156.0 lb

## 2020-01-01 DIAGNOSIS — I10 Essential (primary) hypertension: Secondary | ICD-10-CM

## 2020-01-01 DIAGNOSIS — M7551 Bursitis of right shoulder: Secondary | ICD-10-CM | POA: Diagnosis not present

## 2020-01-01 DIAGNOSIS — E1140 Type 2 diabetes mellitus with diabetic neuropathy, unspecified: Secondary | ICD-10-CM | POA: Diagnosis not present

## 2020-01-01 DIAGNOSIS — I5042 Chronic combined systolic (congestive) and diastolic (congestive) heart failure: Secondary | ICD-10-CM | POA: Diagnosis not present

## 2020-01-01 DIAGNOSIS — I110 Hypertensive heart disease with heart failure: Secondary | ICD-10-CM | POA: Diagnosis not present

## 2020-01-01 DIAGNOSIS — J439 Emphysema, unspecified: Secondary | ICD-10-CM

## 2020-01-01 DIAGNOSIS — M7541 Impingement syndrome of right shoulder: Secondary | ICD-10-CM | POA: Diagnosis not present

## 2020-01-01 DIAGNOSIS — F17210 Nicotine dependence, cigarettes, uncomplicated: Secondary | ICD-10-CM

## 2020-01-01 LAB — GLUCOSE, CAPILLARY: Glucose-Capillary: 130 mg/dL — ABNORMAL HIGH (ref 70–99)

## 2020-01-01 LAB — POCT GLYCOSYLATED HEMOGLOBIN (HGB A1C): Hemoglobin A1C: 6.4 % — AB (ref 4.0–5.6)

## 2020-01-01 MED ORDER — ZOSTER VAC RECOMB ADJUVANTED 50 MCG/0.5ML IM SUSR: 0.5000 mL | INTRAMUSCULAR | 1 refills | Status: AC

## 2020-01-01 NOTE — Progress Notes (Signed)
Diabetes Self-Management Education  Visit Type: 6 Month Follow-Up  Appt. Start Time: 1123 Appt. End Time: 1145  01/01/2020  [name], identified by name and date of birth, is a 69 y.o. male with a diagnosis of Diabetes:  .   ASSESSMENT  There were no vitals taken for this visit. There is no height or weight on file to calculate BMI.   Diabetes Self-Management Education - 01/01/20 1200      Visit Information   Visit Type 6 Month Follow-Up      Health Coping   How would you rate your overall health? Excellent      Complications   Have you had a dilated eye exam in the past 12 months? Yes   he was asked to schedule an apt for diabetes eye disease   Are you checking your feet? Yes   he requests a referral to a different foot doctor   How many days per week are you checking your feet? 4      Dietary Intake   Snack (evening) orange,soda, clementine    Beverage(s) water, soda, tea      Exercise   Exercise Type ADL's;Light (walking / raking leaves)    How many days per week to you exercise? 7    How many minutes per day do you exercise? 30    Total minutes per week of exercise 210      Patient Education   Acute complications Discussed and identified patients' treatment of hyperglycemia.    Chronic complications Assessed and discussed foot care and prevention of foot problems;Retinopathy and reason for yearly dilated eye exams      Individualized Goals (developed by patient)   Reducing Risk examine blood glucose patterns;do foot checks daily      Post-Education Assessment   Patient understands prevention, detection, and treatment of acute complications. Demonstrates understanding / competency    Patient understands prevention, detection, and treatment of chronic complications. Demonstrates understanding / competency      Outcomes   Expected Outcomes Demonstrated interest in learning. Expect positive outcomes    Future DMSE 6 months    Program Status Completed       Subsequent Visit   Since your last visit have you continued or begun to take your medications as prescribed? Yes    Since your last visit have you had your blood pressure checked? Yes    Is your most recent blood pressure lower, unchanged, or higher since your last visit? Unchanged    Since your last visit have you experienced any weight changes? No change    Since your last visit, are you checking your blood glucose at least once a day? Yes           Individualized Plan for Diabetes Self-Management Training:   Learning Objective:  Patient will have a greater understanding of diabetes self-management. Patient education plan is to attend individual and/or group sessions per assessed needs and concerns.   Plan:   Wife is his support for his diabetes self care.   Expected Outcomes:  Demonstrated interest in learning. Expect positive outcomes  Education material provided: Diabetes Resources  If problems or questions, patient to contact team via:  Phone  Future DSME appointment: 6 months  [name], RD 01/01/2020 12:07 PM.

## 2020-01-02 NOTE — Assessment & Plan Note (Signed)
HPI: He reports that his left shoulder has done much better after the steroid injection a few months ago.  He is able to sleep well on that side but now his right shoulder is bothering him he is having trouble lifting it over 90 degrees.  It also hurts to lay on that side at night.  He previously received benefit from steroid injection about 2 years ago to the right side.  Assessment impingement syndrome of right shoulder  Plan Steroid injection right shoulder today.

## 2020-01-02 NOTE — Progress Notes (Signed)
  Subjective:  HPI: [name] is a 69 y.o. male who presents for right shoulder pain, HTN, COPD, CHF follow up  Please see Assessment and Plan below for the status of his chronic medical problems.  Objective:  Physical Exam: Vitals:   01/01/20 0958  BP: (!) 91/54  Pulse: 86  Temp: 98.1 F (36.7 C)  TempSrc: Oral  SpO2: 100%  Weight: 156 lb (70.8 kg)  Height: 5\' 6"  (1.676 m)   Body mass index is 25.18 kg/m. Physical Exam Vitals and nursing note reviewed.  Constitutional:      Appearance: Normal appearance.  HENT:     Head: Normocephalic and atraumatic.  Pulmonary:     Effort: Pulmonary effort is normal.     Comments: Scattered soft end expiratory wheezing Skin:    Capillary Refill: Capillary refill takes less than 2 seconds.  Neurological:     General: No focal deficit present.     Mental Status: He is alert.  Psychiatric:        Mood and Affect: Mood normal.    Assessment & Plan:  See Encounters Tab for problem based charting.  Medications Ordered Meds ordered this encounter  Medications  . Zoster Vaccine Adjuvanted [facility]) injection    Sig: Inject 0.5 mLs into the muscle every 6 (six) months for 2 doses.    Dispense:  0.5 mL    Refill:  1   Other Orders Orders Placed This Encounter  Procedures  . Glucose, capillary  . Ambulatory Referral to DSME/T    Referral Priority:   Routine    Referral Type:   Consultation    Referred to Provider:   P[place], [facility], RD    Number of Visits Requested:   1  . Ambulatory referral to Podiatry    Referral Priority:   Routine    Referral Type:   Consultation    Referral Reason:   Specialty Services Required    Requested Specialty:   Podiatry    Number of Visits Requested:   1  . POC Hbg A1C   Follow Up: Return in about 6 months (around 07/02/2020).  PROCEDURE NOTE  PROCEDURE: right shoulder joint steroid injection.  PREOPERATIVE DIAGNOSIS: Bursitis of the right shoulder.  POSTOPERATIVE DIAGNOSIS:  Bursitis of the right shoulder.  PROCEDURE: The patient was apprised of the risks and the benefits of the procedure and informed consent was obtained, as witnessed by [facility]. Time-out procedure was performed, with confirmation of the patient's name, date of birth, and correct identification of the right shoulder to be injected. The patient's shoulder was then marked at the appropriate site for injection placement. The shoulder was sterilely prepped with Betadine. A 40 mg (1 milliliter) solution of Kenalog was drawn up into a 3 mL syringe with a 2 mL of 1% lidocaine. The patient was injected with a 25 gauge needle at the lateral  aspect of his  right shoulder. There were no complications. The patient tolerated the procedure well. There was minimal bleeding. The patient was instructed to ice her shoulder upon leaving clinic and refrain from overuse over the next 3 days. The patient was instructed to go to the emergency room with any usual pain, swelling, or redness occurred in the injected area. The patient was given a followup appointment to evaluate response to the injection to his increased range of motion and reduction of pain.

## 2020-01-02 NOTE — Assessment & Plan Note (Signed)
Chronic and stable euvolemic today.  Recently saw Dr. Madelynn Done no changes

## 2020-01-02 NOTE — Assessment & Plan Note (Signed)
HPI: Still occasionally smoking maybe 5 cigarettes a day.  Denies any shortness of breath.  No symptomatic wheezing.  Assessment COPD with emphysema  Plan Continue albuterol as needed.

## 2020-01-02 NOTE — Assessment & Plan Note (Signed)
HPI: He reports taking his medications as prescribed.  Blood pressure is low normal here he denies any orthostatic symptoms.  No changes in vision.  No recent heart failure exacerbations.  Recently saw Dr. Madelynn Done for follow-up.   Assessment essential hypertension well-controlled  Plan For now we will continue his home blood pressure medications which include amlodipine 5 mg daily, Entresto twice daily carvedilol 25 mg twice daily eplerenone 25 mg daily. He also takes [place] which will have some effect on his blood pressure. If we need to stop the medication I would probably for start with amlodipine he does not have any anginal symptoms and this does not provide any CHF benefit.

## 2020-01-02 NOTE — Assessment & Plan Note (Signed)
HPI: Denies any hyper or hypoglycemia.  Denies any increase in urination.  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

## 2020-01-07 ENCOUNTER — Encounter: Payer: Self-pay | Admitting: Internal Medicine

## 2020-01-19 ENCOUNTER — Ambulatory Visit (INDEPENDENT_AMBULATORY_CARE_PROVIDER_SITE_OTHER): Payer: Medicare Other | Admitting: Podiatrist

## 2020-01-19 ENCOUNTER — Other Ambulatory Visit (HOSPITAL_COMMUNITY): Payer: Self-pay | Admitting: Internal Medicine

## 2020-01-19 ENCOUNTER — Other Ambulatory Visit: Payer: Self-pay

## 2020-01-19 VITALS — Temp 97.2°F

## 2020-01-19 DIAGNOSIS — M79675 Pain in left toe(s): Secondary | ICD-10-CM

## 2020-01-19 DIAGNOSIS — E1140 Type 2 diabetes mellitus with diabetic neuropathy, unspecified: Secondary | ICD-10-CM | POA: Diagnosis not present

## 2020-01-19 DIAGNOSIS — B351 Tinea unguium: Secondary | ICD-10-CM | POA: Diagnosis not present

## 2020-01-19 DIAGNOSIS — M79674 Pain in right toe(s): Secondary | ICD-10-CM | POA: Diagnosis not present

## 2020-01-19 MED FILL — CORLANOR 5 MG TABLET: 5 | 90 days supply | Qty: 90 | Fill #0

## 2020-01-19 MED FILL — FUROSEMIDE 40 MG TAB: 40 | 30 days supply | Qty: 60 | Fill #1

## 2020-01-19 MED FILL — CLOPIDOGREL 75 MG TABLET: 75 | 90 days supply | Qty: 90 | Fill #1

## 2020-01-19 NOTE — Patient Instructions (Signed)
Diabetes Mellitus and Foot Care Foot care is an important part of your health, especially when you have diabetes. Diabetes may cause you to have problems because of poor blood flow (circulation) to your feet and legs, which can cause your skin to:  Become thinner and drier.  Break more easily.  Heal more slowly.  Peel and crack. You may also have nerve damage (neuropathy) in your legs and feet, causing decreased feeling in them. This means that you may not notice minor injuries to your feet that could lead to more serious problems. Noticing and addressing any potential problems early is the best way to prevent future foot problems. How to care for your feet Foot hygiene  Wash your feet daily with warm water and mild soap. Do not use hot water. Then, pat your feet and the areas between your toes until they are completely dry. Do not soak your feet as this can dry your skin.  Trim your toenails straight across. Do not dig under them or around the cuticle. File the edges of your nails with an emery board or nail file.  Apply a moisturizing lotion or petroleum jelly to the skin on your feet and to dry, brittle toenails. Use lotion that does not contain alcohol and is unscented. Do not apply lotion between your toes. Shoes and socks  Wear clean socks or stockings every day. Make sure they are not too tight. Do not wear knee-high stockings since they may decrease blood flow to your legs.  Wear shoes that fit properly and have enough cushioning. Always look in your shoes before you put them on to be sure there are no objects inside.  To break in new shoes, wear them for just a few hours a day. This prevents injuries on your feet. Wounds, scrapes, corns, and calluses  Check your feet daily for blisters, cuts, bruises, sores, and redness. If you cannot see the bottom of your feet, use a mirror or ask someone for help.  Do not cut corns or calluses or try to remove them with medicine.  If you  find a minor scrape, cut, or break in the skin on your feet, keep it and the skin around it clean and dry. You may clean these areas with mild soap and water. Do not clean the area with peroxide, alcohol, or iodine.  If you have a wound, scrape, corn, or callus on your foot, look at it several times a day to make sure it is healing and not infected. Check for: ? Redness, swelling, or pain. ? Fluid or blood. ? Warmth. ? Pus or a bad smell. General instructions  Do not cross your legs. This may decrease blood flow to your feet.  Do not use heating pads or hot water bottles on your feet. They may burn your skin. If you have lost feeling in your feet or legs, you may not know this is happening until it is too late.  Protect your feet from hot and cold by wearing shoes, such as at the beach or on hot pavement.  Schedule a complete foot exam at least once a year (annually) or more often if you have foot problems. If you have foot problems, report any cuts, sores, or bruises to your health care provider immediately. Contact a health care provider if:  You have a medical condition that increases your risk of infection and you have any cuts, sores, or bruises on your feet.  You have an injury that is not   healing.  You have redness on your legs or feet.  You feel burning or tingling in your legs or feet.  You have pain or cramps in your legs and feet.  Your legs or feet are numb.  Your feet always feel cold.  You have pain around a toenail. Get help right away if:  You have a wound, scrape, corn, or callus on your foot and: ? You have pain, swelling, or redness that gets worse. ? You have fluid or blood coming from the wound, scrape, corn, or callus. ? Your wound, scrape, corn, or callus feels warm to the touch. ? You have pus or a bad smell coming from the wound, scrape, corn, or callus. ? You have a fever. ? You have a red line going up your leg. Summary  Check your feet every day  for cuts, sores, red spots, swelling, and blisters.  Moisturize feet and legs daily.  Wear shoes that fit properly and have enough cushioning.  If you have foot problems, report any cuts, sores, or bruises to your health care provider immediately.  Schedule a complete foot exam at least once a year (annually) or more often if you have foot problems. This information is not intended to replace advice given to you by your health care provider. Make sure you discuss any questions you have with your health care provider. Document Revised: 04/02/2019 Document Reviewed: 08/11/2016 Elsevier Patient Education  2020 Elsevier Inc.  

## 2020-01-20 ENCOUNTER — Encounter: Payer: Self-pay | Admitting: Podiatrist

## 2020-01-20 NOTE — Progress Notes (Signed)
  Chief Complaint  Patient presents with  . Nail Problem    Onychomycosis - bilateral. Requests nail trim.  . Diabetes    Fasting AM glucose today per pt = 125mg /dL. Most recent HgbA1c = 6.4.     HPI: Patient is 69 y.o. male who presents today for the concerns as listed above.  Patient relates his toenails are long and thick and he is unable to trim them himself. Right foot swelling is also noted and he is also diabetic with neuropathy  Review of Systems No fevers, chills, nausea, muscle aches, no difficulty breathing, no calf pain, no chest pain or shortness of breath.   Physical Exam  GENERAL APPEARANCE: Alert, conversant. Appropriately groomed. No acute distress.   VASCULAR: Pedal pulses palpable DP and PT bilateral.  Capillary refill time is immediate to all digits,  Proximal to distal cooling it warm to warm.  Digital perfusion adequate. Forefoot swelling under the toes on the right foot is noted compared to the left. No other lower leg swelling appreciated.  NEUROLOGIC: sensation is intact epicritically and protectively to 5.07 monofilament at 2/5 sites bilateral.  Light touch is intact bilateral, vibratory sensation intact bilateral, achilles tendon reflex is intact bilateral.   MUSCULOSKELETAL: acceptable muscle strength, tone and stability bilateral.  No gross boney pedal deformities noted.  No pain, crepitus or limitation noted with foot and ankle range of motion bilateral.   DERMATOLOGIC: skin is warm, supple, and dry.  No open lesions noted.  No rash, no pre ulcerative lesions. Digital nails are long, thick, discolored, dystrophic, brittle with subungual debris present and clinically mycotic x 10.      Assessment   Pain due to onychomycosis of toenails of both feet  Controlled type 2 diabetes mellitus with diabetic neuropathy, without long-term current use of insulin (HCC)    Plan  Debridement of toenails was recommended.  Onychoreduction of symptomatic toenails was  performed via nail nipper and power burr without iatrogenic incident. surgigrip was dispensed for the right foot and compression hose were recommended for the swelling.  He will return in 3 months for at risk foot care.  He was instructed on signs and symptoms of infection and was told to call immediately should any of these arise.

## 2020-01-29 MED FILL — DORZOLAMIDE-TIMOLOL EYE DRP: 22.3-6.8 | 50 days supply | Qty: 10 | Fill #2

## 2020-01-29 MED FILL — LATANOPROST 0.005% EYE DRP: 0.005 | 75 days supply | Qty: 8 | Fill #2

## 2020-02-09 MED FILL — UNIFINE PENTIPS 31GX3/16: 31G X 5 MM | 90 days supply | Qty: 100 | Fill #3

## 2020-02-09 MED FILL — ACCU-CHEK GUIDE STRP: 90 days supply | Qty: 100 | Fill #2

## 2020-02-16 MED FILL — ATORVASTATIN 80 MG TABLET: 80 | 90 days supply | Qty: 90 | Fill #3

## 2020-02-17 ENCOUNTER — Other Ambulatory Visit: Payer: Self-pay | Admitting: *Deleted

## 2020-02-19 ENCOUNTER — Other Ambulatory Visit (HOSPITAL_COMMUNITY): Payer: Self-pay | Admitting: Internal Medicine

## 2020-02-19 MED ORDER — CARVEDILOL 25 MG PO TABS: 25.0000 mg | ORAL_TABLET | Freq: Two times a day (BID) | ORAL | 3 refills | Status: DC

## 2020-02-19 MED FILL — CARVEDILOL 25 MG TABLET: 25 | 90 days supply | Qty: 180 | Fill #0

## 2020-03-11 ENCOUNTER — Other Ambulatory Visit (HOSPITAL_COMMUNITY): Payer: Self-pay | Admitting: Internal Medicine

## 2020-03-11 MED FILL — FUROSEMIDE 40 MG TAB: 40 | 30 days supply | Qty: 60 | Fill #2

## 2020-03-11 MED FILL — ENTRESTO 97 MG-103 MG TAB: 97-103 | 90 days supply | Qty: 180 | Fill #0

## 2020-03-15 MED FILL — metFORMIN HCL 1000 MG TABS: 1000 | 90 days supply | Qty: 180 | Fill #1

## 2020-03-16 NOTE — Progress Notes (Signed)
Advanced Heart Failure Clinic Note   PCP: Joni Reining HF MD: Dr Haroldine Laws  HPI: Eddie [name] is a 69 y.o. male  with h/o DM2, CAD s/p NSTEMI with LAD stent in 3/17, CVA, chronic systolic HF with EF 98-26%.   Had multiple admissions in May and June 2017 in the setting of decompensated CHF and EColi bacteremia. Readmitted in June 2017 with acute respiratory failure requiring intubation in setting of severe HTN and ADHF. Treated with milrinone which was eventually weaned off.     EF subsequently improved to 40-45%.  Today he returns for HF follow up with his wife. Feels great. Using CPAP. Walks slowly with a cane. Says he has neuropathy in R foot. Wants to get diabetic shoes. No LE edema or sores. No edema, orthopnea or PND. Still smoking a few "now and then". Taking all medications without problem.   Echo 05/2019 EF 40-45%  Echo 12/19 EF 30-35%  Echo 01/11/17: EF 40%.   RV ok.  ECHO 06/19/2016: EF 20-25%.Cannot exclude small  laminar apical thrombus. No mobile or protuberant thrombus is  seen. Echo 5/15 EF 15-20%  Review of systems complete and found to be negative unless listed in HPI.    Past Medical History:  Diagnosis Date  . Blindness of left eye   . CAD (coronary artery disease)    a. STEMI 09/2015 w/ DES to Prox LAD  . CVA (cerebral infarction)    a. 09/2015: acute small right frontal lobe infarct  . Diabetes ([place])   . Hypertension   . Ischemic cardiomyopathy   . OSA (obstructive sleep apnea) 07/06/2017   Mild OSA with AHI of 8.2/hr with hypoxemia as low as 76% now on CPAP at 11cm H2O.  . PUD (peptic ulcer disease)   . ST elevation myocardial infarction involving left anterior descending (LAD) coronary artery ([place]) 09/28/2015    Current Outpatient Medications  Medication Sig Dispense Refill  . Accu-Chek FastClix Lancets MISC Use to check blood sugar 1 time a day 102 each 3  . Acetaminophen (TYLENOL) 325 MG CAPS Take 2 tablets by mouth every 4 (four) hours as  needed.    Marland Kitchen albuterol (PROVENTIL HFA;VENTOLIN HFA) 108 (90 Base) MCG/ACT inhaler Inhale 2 puffs into the lungs every 6 (six) hours as needed for wheezing or shortness of breath. 1 Inhaler 2  . amLODipine (NORVASC) 5 MG tablet TAKE 1 TABLET (5 MG TOTAL) BY MOUTH DAILY. 30 tablet 3  . aspirin EC 81 MG tablet Take 1 tablet (81 mg total) by mouth daily. 90 tablet 3  . atorvastatin (LIPITOR) 80 MG tablet TAKE 1 TABLET BY MOUTH ONCE DAILY AT 6 PM. 90 tablet 3  . bimatoprost (LUMIGAN) 0.03 % ophthalmic solution Place 1 drop into the right eye at bedtime.    . Blood Glucose Monitoring Suppl (ACCU-CHEK GUIDE) w/Device KIT 1 kit by Does not apply route daily. 1 kit 0  . brimonidine (ALPHAGAN) 0.2 % ophthalmic solution Apply to eye.    . carvedilol (COREG) 25 MG tablet Take 1 tablet (25 mg total) by mouth 2 (two) times daily. 180 tablet 3  . clopidogrel (PLAVIX) 75 MG tablet TAKE 1 TABLET (75 MG TOTAL) BY MOUTH DAILY. 90 tablet 3  . CORLANOR 5 MG TABS tablet TAKE 1/2 TABLET BY MOUTH 2 TIMES DAILY WITH A MEAL. 90 tablet 3  . ENTRESTO 97-103 MG TAKE 1 TABLET BY MOUTH 2 TIMES DAILY. 180 tablet 3  . eplerenone (INSPRA) 25 MG tablet Take  1 tablet (25 mg total) by mouth daily. 90 tablet 3  . furosemide (LASIX) 40 MG tablet TAKE 1 TABLET (40 MG TOTAL) BY MOUTH 2 (TWO) TIMES DAILY. 60 tablet 3  . glucose blood (ACCU-CHEK GUIDE) test strip Use to check blood sugar once daily 100 each 12  . Insulin Pen Needle 32G X 4 MM MISC Use to inject insulin one time a day 100 each 5  . JARDIANCE 10 MG TABS tablet TAKE 1 TABLET BY MOUTH DAILY BEFORE BREAKFAST. 90 tablet 3  . liraglutide (VICTOZA) 18 MG/3ML SOPN Inject 0.1 mLs (0.6 mg total) into the skin daily. 9 mL 3  . metFORMIN (GLUCOPHAGE) 1000 MG tablet Take 1 tablet (1,000 mg total) by mouth 2 (two) times daily with a meal. 180 tablet 3  . nitroGLYCERIN (NITROSTAT) 0.4 MG SL tablet Use one pill every 5 minutes X 3 if needed for chest pain. Contact MD if pain does not  improve 30 tablet 0  . Zoster Vaccine Adjuvanted [facility]) injection Inject 0.5 mLs into the muscle every 6 (six) months for 2 doses. 0.5 mL 1   No current facility-administered medications for this encounter.    Allergies  Allergen Reactions  . Bidil [Isosorb Dinitrate-Hydralazine] Other (See Comments)    Patient became lightheaded with 1 tablet tid.       Social History   Socioeconomic History  . Marital status: Married    Spouse name: Not on file  . Number of children: Not on file  . Years of education: Not on file  . Highest education level: Not on file  Occupational History  . Occupation: Electrical engineer: [employer]  Tobacco Use  . Smoking status: Current Some Day Smoker    Packs/day: 0.20    Types: Cigarettes  . Smokeless tobacco: Never Used  Substance and Sexual Activity  . Alcohol use: No    Alcohol/week: 0.0 standard drinks  . Drug use: No  . Sexual activity: Not on file  Other Topics Concern  . Not on file  Social History Narrative  . Not on file   Social Determinants of Health   Financial Resource Strain:   . Difficulty of Paying Living Expenses: Not on file  Food Insecurity:   . Worried About Charity fundraiser in the Last Year: Not on file  . Ran Out of Food in the Last Year: Not on file  Transportation Needs:   . Lack of Transportation (Medical): Not on file  . Lack of Transportation (Non-Medical): Not on file  Physical Activity:   . Days of Exercise per Week: Not on file  . Minutes of Exercise per Session: Not on file  Stress:   . Feeling of Stress : Not on file  Social Connections:   . Frequency of Communication with Friends and Family: Not on file  . Frequency of Social Gatherings with Friends and Family: Not on file  . Attends Religious Services: Not on file  . Active Member of Clubs or Organizations: Not on file  . Attends Archivist Meetings: Not on file  . Marital Status: Not on file  Intimate Partner  Violence:   . Fear of Current or Ex-Partner: Not on file  . Emotionally Abused: Not on file  . Physically Abused: Not on file  . Sexually Abused: Not on file      Family History  Problem Relation Age of Onset  . Diabetes Mother   . Cancer  Neg Hx     Vitals:   03/17/20 0940  BP: 110/66  Pulse: 83  SpO2: 99%  Weight: 66.8 kg (147 lb 6 oz)   Wt Readings from Last 3 Encounters:  03/17/20 66.8 kg (147 lb 6 oz)  01/01/20 70.8 kg (156 lb)  12/03/19 68 kg (150 lb)     PHYSICAL EXAM: General:  Well appearing. No resp difficulty HEENT: normal Neck: supple. no JVD. Carotids 2+ bilat; no bruits. No lymphadenopathy or thryomegaly appreciated. Cor: PMI nondisplaced. Regular rate & rhythm. No rubs, gallops or murmurs. Lungs: clear decreased throughout  Abdomen: soft, nontender, nondistended. No hepatosplenomegaly. No bruits or masses. Good bowel sounds. Extremities: no cyanosis, clubbing, rash, edema Neuro: alert & orientedx3, cranial nerves grossly intact. moves all 4 extremities w/o difficulty. Affect pleasant   ASSESSMENT & PLAN: 1. Chronicsystolic CHF : ICM May 9276 EF 15-20% and 12/17 EF 25-30%. Echo 12/2016 EF improved 40% - Echo 12/19 EF 30-35%. - Echo 05/2019 EF 40-45% - Stable NYHA II-III - Volume status stable. Continue  lasix to 40 bid  - Continue Entresto 97/103 mg BID.  - Continue Coreg 25 mg BID - Continue inspar 25 mg daily - Intolerant to Bidil in the past with dizziness and headaches.  - Continue Corlanor 2.5 bid.  - Continue jardiance 10 mg daily.  - Check BMET    2. CAD: s/p DES to proximal LAD in 09/2015  - No s/s angina - Continue ASA, Plavix and atorvastatin.  - LDL at goal 61  3. HTN - Blood pressure well controlled. Continue current regimen.  4. 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  - Continue DAPT   5. DMII - Per PCP. On metformin and Victoza  - Continue jardiance 10 mg daily  6. OSA  -Continue CPAP every  night. .   7.  Tobacco use - Discussed smoking cessation  8. CKD Stage IIIb - last creatinine 2.1 - check labs today - on Jardiance  Follow up in 6 months with Dr Haroldine Laws   Glori Bickers, MD  9:46 AM

## 2020-03-17 ENCOUNTER — Encounter (HOSPITAL_COMMUNITY): Payer: Self-pay | Admitting: Internal Medicine

## 2020-03-17 ENCOUNTER — Other Ambulatory Visit (HOSPITAL_COMMUNITY): Payer: Self-pay | Admitting: Internal Medicine

## 2020-03-17 ENCOUNTER — Ambulatory Visit (HOSPITAL_COMMUNITY)
Admission: RE | Admit: 2020-03-17 | Discharge: 2020-03-17 | Disposition: A | Discharge status: Home / Self Care | Payer: Medicare Other | Source: Ambulatory Visit | Attending: Internal Medicine | Admitting: Internal Medicine

## 2020-03-17 ENCOUNTER — Other Ambulatory Visit: Payer: Self-pay

## 2020-03-17 ENCOUNTER — Other Ambulatory Visit: Payer: Self-pay | Admitting: *Deleted

## 2020-03-17 VITALS — BP 110/66 | HR 83 | Wt 147.4 lb

## 2020-03-17 DIAGNOSIS — Z8673 Personal history of transient ischemic attack (TIA), and cerebral infarction without residual deficits: Secondary | ICD-10-CM | POA: Insufficient documentation

## 2020-03-17 DIAGNOSIS — E1140 Type 2 diabetes mellitus with diabetic neuropathy, unspecified: Secondary | ICD-10-CM | POA: Diagnosis not present

## 2020-03-17 DIAGNOSIS — I252 Old myocardial infarction: Secondary | ICD-10-CM | POA: Diagnosis not present

## 2020-03-17 DIAGNOSIS — G4733 Obstructive sleep apnea (adult) (pediatric): Secondary | ICD-10-CM | POA: Insufficient documentation

## 2020-03-17 DIAGNOSIS — Z955 Presence of coronary angioplasty implant and graft: Secondary | ICD-10-CM | POA: Insufficient documentation

## 2020-03-17 DIAGNOSIS — I2510 Atherosclerotic heart disease of native coronary artery without angina pectoris: Secondary | ICD-10-CM

## 2020-03-17 DIAGNOSIS — I5022 Chronic systolic (congestive) heart failure: Secondary | ICD-10-CM

## 2020-03-17 DIAGNOSIS — Z8711 Personal history of peptic ulcer disease: Secondary | ICD-10-CM | POA: Diagnosis not present

## 2020-03-17 DIAGNOSIS — I255 Ischemic cardiomyopathy: Secondary | ICD-10-CM | POA: Diagnosis not present

## 2020-03-17 DIAGNOSIS — Z833 Family history of diabetes mellitus: Secondary | ICD-10-CM | POA: Diagnosis not present

## 2020-03-17 DIAGNOSIS — E1142 Type 2 diabetes mellitus with diabetic polyneuropathy: Secondary | ICD-10-CM

## 2020-03-17 DIAGNOSIS — Z716 Tobacco abuse counseling: Secondary | ICD-10-CM | POA: Insufficient documentation

## 2020-03-17 DIAGNOSIS — Z720 Tobacco use: Secondary | ICD-10-CM

## 2020-03-17 DIAGNOSIS — Z79899 Other long term (current) drug therapy: Secondary | ICD-10-CM | POA: Insufficient documentation

## 2020-03-17 DIAGNOSIS — Z7982 Long term (current) use of aspirin: Secondary | ICD-10-CM | POA: Insufficient documentation

## 2020-03-17 DIAGNOSIS — F17210 Nicotine dependence, cigarettes, uncomplicated: Secondary | ICD-10-CM | POA: Insufficient documentation

## 2020-03-17 DIAGNOSIS — N1832 Chronic kidney disease, stage 3b: Secondary | ICD-10-CM

## 2020-03-17 DIAGNOSIS — Z7902 Long term (current) use of antithrombotics/antiplatelets: Secondary | ICD-10-CM | POA: Insufficient documentation

## 2020-03-17 DIAGNOSIS — Z23 Encounter for immunization: Secondary | ICD-10-CM | POA: Diagnosis not present

## 2020-03-17 DIAGNOSIS — I10 Essential (primary) hypertension: Secondary | ICD-10-CM | POA: Diagnosis not present

## 2020-03-17 DIAGNOSIS — E1122 Type 2 diabetes mellitus with diabetic chronic kidney disease: Secondary | ICD-10-CM | POA: Diagnosis not present

## 2020-03-17 DIAGNOSIS — Z794 Long term (current) use of insulin: Secondary | ICD-10-CM | POA: Diagnosis not present

## 2020-03-17 DIAGNOSIS — I130 Hypertensive heart and chronic kidney disease with heart failure and stage 1 through stage 4 chronic kidney disease, or unspecified chronic kidney disease: Secondary | ICD-10-CM | POA: Insufficient documentation

## 2020-03-17 LAB — BASIC METABOLIC PANEL
Anion gap: 10 (ref 5–15)
BUN: 19 mg/dL (ref 8–23)
CO2: 24 mmol/L (ref 22–32)
Calcium: 9.1 mg/dL (ref 8.9–10.3)
Chloride: 106 mmol/L (ref 98–111)
Creatinine, Ser: 1.56 mg/dL — ABNORMAL HIGH (ref 0.61–1.24)
GFR calc Af Amer: 52 mL/min — ABNORMAL LOW (ref >60)
GFR calc non Af Amer: 45 mL/min — ABNORMAL LOW (ref >60)
Glucose, Bld: 206 mg/dL — ABNORMAL HIGH (ref 70–99)
Potassium: 3.8 mmol/L (ref 3.5–5.1)
Sodium: 140 mmol/L (ref 135–145)

## 2020-03-17 LAB — BRAIN NATRIURETIC PEPTIDE: B Natriuretic Peptide: 32.5 pg/mL (ref 0.0–100.0)

## 2020-03-17 MED ORDER — AMLODIPINE BESYLATE 5 MG PO TABS
5.0000 mg | ORAL_TABLET | Freq: Every day | ORAL | 3 refills | Status: DC
Start: 2020-03-17 — End: 2020-07-20

## 2020-03-17 MED ORDER — VICTOZA 18 MG/3ML ~~LOC~~ SOPN: 0.6000 mg | PEN_INJECTOR | Freq: Every day | SUBCUTANEOUS | 3 refills | Status: DC

## 2020-03-17 MED FILL — AMLODIPINE BESYLATE 5 MG TA: 5 | 30 days supply | Qty: 30 | Fill #0

## 2020-03-17 MED FILL — VICTOZA 18 MG/3 ML INJECT P: 18 | 90 days supply | Qty: 9 | Fill #0

## 2020-03-17 NOTE — Telephone Encounter (Signed)
Patient wife called in requesting refill on amlodioine and victoza. States he's been taking both these meds daily. Kinnie Feil, BSN, RN-BC

## 2020-03-17 NOTE — Addendum Note (Signed)
Encounter addended by: Modesta Messing, CMA on: 03/17/2020 9:53 AM  Actions taken: Order list changed, Diagnosis association updated, Clinical Note Signed, Charge Capture section accepted

## 2020-03-17 NOTE — Patient Instructions (Signed)
Routine lab work today. Will notify you of abnormal results   Follow up in 6 months with Dr.Bensimhon  

## 2020-03-30 MED FILL — JARDIANCE 10 MG TABLET: 10 | 90 days supply | Qty: 90 | Fill #1

## 2020-04-02 MED FILL — ACCU-CHEK FASTCLIX LANCETS: 90 days supply | Qty: 102 | Fill #1

## 2020-04-14 MED FILL — CLOPIDOGREL 75 MG TABLET: 75 | 90 days supply | Qty: 90 | Fill #2

## 2020-04-14 MED FILL — AMLODIPINE BESYLATE 5 MG TA: 5 | 30 days supply | Qty: 30 | Fill #1

## 2020-04-19 MED FILL — UNIFINE PENTIPS 31GX3/16: 31G X 5 MM | 90 days supply | Qty: 100 | Fill #4

## 2020-04-19 MED FILL — ACCU-CHEK GUIDE STRP: 90 days supply | Qty: 100 | Fill #3

## 2020-04-21 MED FILL — CORLANOR 5 MG TABLET: 5 | 90 days supply | Qty: 90 | Fill #1

## 2020-04-26 ENCOUNTER — Encounter: Payer: Self-pay | Admitting: Podiatry

## 2020-04-26 ENCOUNTER — Ambulatory Visit (INDEPENDENT_AMBULATORY_CARE_PROVIDER_SITE_OTHER): Payer: Medicare Other | Admitting: Podiatry

## 2020-04-26 ENCOUNTER — Other Ambulatory Visit: Payer: Self-pay

## 2020-04-26 DIAGNOSIS — N1832 Chronic kidney disease, stage 3b: Secondary | ICD-10-CM

## 2020-04-26 DIAGNOSIS — B351 Tinea unguium: Secondary | ICD-10-CM | POA: Diagnosis not present

## 2020-04-26 DIAGNOSIS — M79675 Pain in left toe(s): Secondary | ICD-10-CM

## 2020-04-26 DIAGNOSIS — Z720 Tobacco use: Secondary | ICD-10-CM | POA: Diagnosis not present

## 2020-04-26 DIAGNOSIS — I739 Peripheral vascular disease, unspecified: Secondary | ICD-10-CM

## 2020-04-26 DIAGNOSIS — E1142 Type 2 diabetes mellitus with diabetic polyneuropathy: Secondary | ICD-10-CM

## 2020-04-26 DIAGNOSIS — M79674 Pain in right toe(s): Secondary | ICD-10-CM

## 2020-04-29 NOTE — Progress Notes (Signed)
Subjective:  Patient ID: Eddie [name], male    DOB: 1951-03-23,  MRN: [id number]  69 y.o. male presents with at risk foot care with history of diabetic neuropathy and painful thick toenails that are difficult to trim. Pain interferes with ambulation. Aggravating factors include wearing enclosed shoe gear. Pain is relieved with periodic professional debridement.  Today, patient c/o cramping in RLE. He relates 1/2 block claudication symptoms of RLE. Also has h/o CKD stage 3b.  He continues to smoke.   PCP: Lucious Groves, DO and last visit was: 01/01/2020.  Review of Systems: Negative except as noted in the HPI.  Past Medical History:  Diagnosis Date  . Blindness of left eye   . CAD (coronary artery disease)    a. STEMI 09/2015 w/ DES to Prox LAD  . CVA (cerebral infarction)    a. 09/2015: acute small right frontal lobe infarct  . Diabetes ([place])   . Hypertension   . Ischemic cardiomyopathy   . OSA (obstructive sleep apnea) 07/06/2017   Mild OSA with AHI of 8.2/hr with hypoxemia as low as 76% now on CPAP at 11cm H2O.  . PUD (peptic ulcer disease)   . ST elevation myocardial infarction involving left anterior descending (LAD) coronary artery ([place]) 09/28/2015   Past Surgical History:  Procedure Laterality Date  . CARDIAC CATHETERIZATION N/A 09/28/2015   Procedure: Left Heart Cath and Coronary Angiography;  Surgeon: Lorretta Harp, MD;  Location: [facility];  Service: Cardiovascular;  Laterality: N/A;  . CARDIAC CATHETERIZATION N/A 10/08/2015   Procedure: Left Heart Cath and Coronary Angiography;  Surgeon: Belva Crome, MD;  Location: [facility];  Service: Cardiovascular;  Laterality: N/A;  . CATARACT EXTRACTION Right   . Repair of Peptic Ulcer     Patient Active Problem List   Diagnosis Date Noted  . Impingement syndrome of right shoulder 01/01/2020  . COPD (chronic obstructive pulmonary disease) with emphysema ([place]) 07/10/2019  . Complaining of cold hands 10/04/2018   . Pain in right shoulder 05/30/2018  . Primary open angle glaucoma of both eyes, severe stage 03/07/2018  . Cataract of left eye 03/07/2018  . 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 OSA (obstructive sleep apnea) 07/06/2017  . Shoulder impingement syndrome, left 04/13/2017  . Dyspnea on exertion 12/08/2016  . CAD in native artery 10/06/2016  . CKD (chronic kidney disease) stage 3, GFR 30-59 ml/min (HCC) 10/06/2016  . Health care maintenance 10/06/2016  . Esophageal reflux   . Chronic combined systolic and diastolic heart failure ([place])   . Gait disturbance, post-stroke 12/09/2015  . Tobacco abuse   . Essential hypertension   . Controlled type 2 diabetes mellitus with diabetic neuropathy, without long-term current use of insulin ([place])   . History of CVA (cerebrovascular accident)   . Cataract, nuclear sclerotic, left eye 03/15/2012  . Pseudophakia of right eye 03/15/2012    Current Outpatient Medications:  .  Accu-Chek FastClix Lancets MISC, Use to check blood sugar 1 time a day, Disp: 102 each, Rfl: 3 .  Acetaminophen (TYLENOL) 325 MG CAPS, Take 2 tablets by mouth every 4 (four) hours as needed., Disp: , Rfl:  .  albuterol (PROVENTIL HFA;VENTOLIN HFA) 108 (90 Base) MCG/ACT inhaler, Inhale 2 puffs into the lungs every 6 (six) hours as needed for wheezing or shortness of breath., Disp: 1 Inhaler, Rfl: 2 .  amLODipine (NORVASC) 5 MG tablet, Take 1 tablet (5 mg total) by mouth daily., Disp: 30 tablet, Rfl:  3 .  aspirin EC 81 MG tablet, Take 1 tablet (81 mg total) by mouth daily., Disp: 90 tablet, Rfl: 3 .  atorvastatin (LIPITOR) 80 MG tablet, TAKE 1 TABLET BY MOUTH ONCE DAILY AT 6 PM., Disp: 90 tablet, Rfl: 3 .  bimatoprost (LUMIGAN) 0.03 % ophthalmic solution, Place 1 drop into the right eye at bedtime., Disp: , Rfl:  .  Blood Glucose Monitoring Suppl (ACCU-CHEK GUIDE) w/Device KIT, 1 kit by Does not apply route daily., Disp: 1 kit, Rfl: 0 .  brimonidine (ALPHAGAN) 0.2 %  ophthalmic solution, Apply to eye., Disp: , Rfl:  .  carvedilol (COREG) 25 MG tablet, Take 1 tablet (25 mg total) by mouth 2 (two) times daily., Disp: 180 tablet, Rfl: 3 .  clopidogrel (PLAVIX) 75 MG tablet, TAKE 1 TABLET (75 MG TOTAL) BY MOUTH DAILY., Disp: 90 tablet, Rfl: 3 .  CORLANOR 5 MG TABS tablet, TAKE 1/2 TABLET BY MOUTH 2 TIMES DAILY WITH A MEAL., Disp: 90 tablet, Rfl: 3 .  dorzolamide-timolol (COSOPT) 22.3-6.8 MG/ML ophthalmic solution, 1 drop 2 (two) times daily., Disp: , Rfl:  .  ENTRESTO 97-103 MG, TAKE 1 TABLET BY MOUTH 2 TIMES DAILY., Disp: 180 tablet, Rfl: 3 .  eplerenone (INSPRA) 25 MG tablet, Take 1 tablet (25 mg total) by mouth daily., Disp: 90 tablet, Rfl: 3 .  furosemide (LASIX) 40 MG tablet, TAKE 1 TABLET (40 MG TOTAL) BY MOUTH 2 (TWO) TIMES DAILY., Disp: 60 tablet, Rfl: 3 .  glucose blood (ACCU-CHEK GUIDE) test strip, Use to check blood sugar once daily, Disp: 100 each, Rfl: 12 .  Insulin Pen Needle 32G X 4 MM MISC, Use to inject insulin one time a day, Disp: 100 each, Rfl: 5 .  JARDIANCE 10 MG TABS tablet, TAKE 1 TABLET BY MOUTH DAILY BEFORE BREAKFAST., Disp: 90 tablet, Rfl: 3 .  latanoprost (XALATAN) 0.005 % ophthalmic solution, SMARTSIG:1 Drop(s) In Eye(s) Every Evening, Disp: , Rfl:  .  liraglutide (VICTOZA) 18 MG/3ML SOPN, Inject 0.1 mLs (0.6 mg total) into the skin daily., Disp: 9 mL, Rfl: 3 .  metFORMIN (GLUCOPHAGE) 1000 MG tablet, Take 1 tablet (1,000 mg total) by mouth 2 (two) times daily with a meal., Disp: 180 tablet, Rfl: 3 .  nitroGLYCERIN (NITROSTAT) 0.4 MG SL tablet, Use one pill every 5 minutes X 3 if needed for chest pain. Contact MD if pain does not improve, Disp: 30 tablet, Rfl: 0 .  Zoster Vaccine Adjuvanted (SHINGRIX) injection, Inject 0.5 mLs into the muscle every 6 (six) months for 2 doses., Disp: 0.5 mL, Rfl: 1 Allergies  Allergen Reactions  . Bidil [Isosorb Dinitrate-Hydralazine] Other (See Comments)    Patient became lightheaded with 1 tablet tid.     Social History   Tobacco Use  Smoking Status Current Some Day Smoker  . Packs/day: 0.20  . Types: Cigarettes  Smokeless Tobacco Never Used   Objective:  There were no vitals filed for this visit. Constitutional Patient is a pleasant 69 y.o. African American male in NAD.Marland Kitchen AAO x 3.  Vascular Capillary fill time to digits <3 seconds b/l lower extremities. Palpable pedal pulses b/l LE. Pedal hair absent. Lower extremity skin temperature gradient within normal limits. No pain with calf compression b/l. No ischemia or gangrene noted b/l lower extremities. No cyanosis or clubbing noted.  Neurologic Normal speech. Oriented to person, place, and time. Protective sensation decreased with 10 gram monofilament b/l. Vibratory sensation intact b/l.  Dermatologic Pedal skin with normal turgor, texture and tone bilaterally.  No open wounds bilaterally. No interdigital macerations bilaterally. Toenails 1-5 b/l elongated, discolored, dystrophic, thickened, crumbly with subungual debris and tenderness to dorsal palpation.  Orthopedic: Normal muscle strength 5/5 to all lower extremity muscle groups bilaterally. No pain crepitus or joint limitation noted with ROM b/l. No gross bony deformities bilaterally.   Hemoglobin A1C Latest Ref Rng & Units 01/01/2020 07/10/2019  HGBA1C 4.0 - 5.6 % 6.4(A) 6.7(A)  Some recent data might be hidden   Assessment:   1. Pain due to onychomycosis of toenails of both feet   2. Intermittent claudication (HCC)   3. Stage 3b chronic kidney disease ([place])   4. Tobacco use   5. Diabetic peripheral neuropathy associated with type 2 diabetes mellitus ([place])    Plan:  Patient was evaluated and treated and all questions answered.  Onychomycosis with pain -Nails palliatively debridement as below. -Educated on self-care  Procedure: Nail Debridement Rationale: Pain Type of Debridement: manual, sharp debridement. Instrumentation: Nail nipper, rotary burr. Number of Nails:  10  -Examined patient. -Continue diabetic foot care principles. -Toenails 1-5 b/l were debrided in length and girth with sterile nail nippers and dremel without iatrogenic bleeding.  -Patient to report any pedal injuries to medical professional immediately. -Patient to continue soft, supportive shoe gear daily. Start procedure for diabetic shoes. Patient qualifies based on diagnoses. -Will order ABI's for RLE claudication symptoms. -Patient/POA to call should there be question/concern in the interim.  Return in about 3 months (around 07/27/2020).  Marzetta Board, DPM

## 2020-05-10 MED FILL — FUROSEMIDE 40 MG TAB: 40 | 30 days supply | Qty: 60 | Fill #3

## 2020-05-10 MED FILL — CARVEDILOL 25 MG TABLET: 25 | 90 days supply | Qty: 180 | Fill #1

## 2020-05-10 MED FILL — AMLODIPINE BESYLATE 5 MG TA: 5 | 30 days supply | Qty: 30 | Fill #2

## 2020-05-13 ENCOUNTER — Other Ambulatory Visit: Payer: Medicare Other | Admitting: Orthotics

## 2020-05-19 ENCOUNTER — Other Ambulatory Visit: Payer: Self-pay

## 2020-05-19 DIAGNOSIS — I2510 Atherosclerotic heart disease of native coronary artery without angina pectoris: Secondary | ICD-10-CM

## 2020-05-20 ENCOUNTER — Other Ambulatory Visit (HOSPITAL_COMMUNITY): Payer: Self-pay | Admitting: Internal Medicine

## 2020-05-20 MED ORDER — ATORVASTATIN CALCIUM 80 MG PO TABS: 80.0000 mg | ORAL_TABLET | Freq: Every day | ORAL | 3 refills | Status: DC

## 2020-05-20 MED FILL — ATORVASTATIN 80 MG TABLET: 80 | 90 days supply | Qty: 90 | Fill #0

## 2020-05-31 ENCOUNTER — Ambulatory Visit: Payer: Medicare Other | Admitting: Orthotics

## 2020-05-31 ENCOUNTER — Other Ambulatory Visit: Payer: Self-pay

## 2020-05-31 DIAGNOSIS — I739 Peripheral vascular disease, unspecified: Secondary | ICD-10-CM

## 2020-05-31 NOTE — Progress Notes (Signed)

## 2020-06-14 MED FILL — ENTRESTO 97 MG-103 MG TAB: 97-103 | 90 days supply | Qty: 180 | Fill #1

## 2020-06-14 MED FILL — METFORMIN HCL 1000 MG TABS: 1000 | 90 days supply | Qty: 180 | Fill #2

## 2020-06-14 MED FILL — AMLODIPINE BESYLATE 5 MG TA: 5 | 30 days supply | Qty: 30 | Fill #3

## 2020-06-14 MED FILL — VICTOZA 18 MG/3 ML INJECT P: 18 | 90 days supply | Qty: 9 | Fill #1

## 2020-06-28 MED FILL — JARDIANCE 10 MG TABLET: 10 | 90 days supply | Qty: 90 | Fill #2

## 2020-07-01 ENCOUNTER — Ambulatory Visit (INDEPENDENT_AMBULATORY_CARE_PROVIDER_SITE_OTHER): Payer: Medicare Other | Admitting: Dietician

## 2020-07-01 ENCOUNTER — Encounter: Payer: Self-pay | Admitting: Dietician

## 2020-07-01 ENCOUNTER — Other Ambulatory Visit: Payer: Medicare Other | Admitting: Orthotics

## 2020-07-01 VITALS — Wt 151.0 lb

## 2020-07-01 DIAGNOSIS — Z713 Dietary counseling and surveillance: Secondary | ICD-10-CM

## 2020-07-01 DIAGNOSIS — Z6824 Body mass index (BMI) 24.0-24.9, adult: Secondary | ICD-10-CM

## 2020-07-01 DIAGNOSIS — Z23 Encounter for immunization: Secondary | ICD-10-CM

## 2020-07-01 DIAGNOSIS — E1140 Type 2 diabetes mellitus with diabetic neuropathy, unspecified: Secondary | ICD-10-CM

## 2020-07-01 LAB — POCT GLYCOSYLATED HEMOGLOBIN (HGB A1C): Hemoglobin A1C: 6.1 % — AB (ref 4.0–5.6)

## 2020-07-01 LAB — GLUCOSE, CAPILLARY: Glucose-Capillary: 151 mg/dL — ABNORMAL HIGH (ref 70–99)

## 2020-07-01 NOTE — Patient Instructions (Addendum)
Thank you for coming in today!  I will ask your doctor for a referral to an eye doctor.  Your goal set today is: STOP SMOKING  You also said you want to get your COVID-19 Booster vaccine. That's great!  You  can ask your doctor about getting a tetanus shot and colonoscopy because they are showing due on your chart.   Your A1C today is: 6.1%- keep doing what doing!!!  This means that your blood sugars have been  About 128 for the past 3 months on average  To improve your good cholesterol( HDL) try to eat more nuts like peanut butter, olives, olive oil, salmon, tuna, avocados, fruit or muffins instead of cakes and cookies,   Rudolph Dobler [phone]

## 2020-07-01 NOTE — Progress Notes (Signed)
Diabetes Self-Management Education  Visit Type: Follow-up  Appt. Start Time: 1030 Appt. End Time: 1130  07/01/2020  [name], identified by name and date of birth, is a 69 y.o. male with a diagnosis of Diabetes:  Type 2.   ASSESSMENT He reports his blood sugar was 125 this am. He forgot to bring his meter. Had hot chocolate for breakfast.  Has pain in front of calves when walking, resting makes the pain go away. walks then gets tired, walks again.   His weight is appropriate, encourage healthy fats, whole grains and fruits.   Lab Results  Component Value Date   HGBA1C 6.1 (A) 07/01/2020   HGBA1C 6.4 (A) 01/01/2020   HGBA1C 6.7 (A) 07/10/2019   HGBA1C 6.0 (A) 01/23/2019   HGBA1C 5.6 10/03/2018    Wt Readings from Last 10 Encounters:  07/01/20 151 lb (68.5 kg)  03/17/20 147 lb 6 oz (66.8 kg)  01/01/20 156 lb (70.8 kg)  12/03/19 150 lb (68 kg)  09/15/19 152 lb 12.8 oz (69.3 kg)  07/10/19 153 lb 9.6 oz (69.7 kg)  06/12/19 158 lb (71.7 kg)  06/05/19 158 lb 6.4 oz (71.8 kg)  03/05/19 151 lb 9.6 oz (68.8 kg)  01/23/19 154 lb (69.9 kg)   BP Readings from Last 3 Encounters:  03/17/20 110/66  01/01/20 (!) 91/54  12/03/19 119/69   Weight 151 lb (68.5 kg). Body mass index is 24.37 kg/m.   Diabetes Self-Management Education - 07/01/20 1000      Visit Information   Visit Type Follow-up      Health Coping   How would you rate your overall health? Good      Psychosocial Assessment   Self-care barriers None    Self-management support Family;CDE visits;Doctor's office    Other persons present Spouse/SO    Patient Concerns Support      Pre-Education Assessment   Patient understands prevention, detection, and treatment of chronic complications. Needs Review      Complications   Last HgB A1C per patient/outside source 6.2 %    How often do you check your blood sugar? 1-2 times/day    Fasting Blood glucose range (mg/dL) 578-469;62-952    Have you had a dilated eye  exam in the past 12 months? Yes    Have you had a dental exam in the past 12 months? Yes    Are you checking your feet? Yes    How many days per week are you checking your feet? 7      Dietary Intake   Breakfast grits, sausage strip, egg, biscuit    Snack (morning) chips    Lunch hot dogx2, 2 slices bread, cheese, sweet tea,    Dinner meatloaf, brown rice and gravy, biscuit, limas    Snack (evening) almond joy, clementines    Beverage(s) hot choc, decaf coffee      Exercise   Exercise Type Light (walking / raking leaves);ADL's    How many days per week to you exercise? 7    How many minutes per day do you exercise? 30    Total minutes per week of exercise 210      Patient Education   Chronic complications Assessed and discussed foot care and prevention of foot problems;Retinopathy and reason for yearly dilated eye exams;Reviewed with patient heart disease, higher risk of, and prevention;Applicable immunizations      Individualized Goals (developed by patient)   Reducing Risk stop smoking      Outcomes  Expected Outcomes Demonstrated interest in learning. Expect positive outcomes    Future DMSE 2 months    Program Status Re-entered      Subsequent Visit   Since your last visit have you continued or begun to take your medications as prescribed? Yes    Since your last visit have you had your blood pressure checked? Yes    Is your most recent blood pressure lower, unchanged, or higher since your last visit? Lower    Since your last visit have you experienced any weight changes? No change    Since your last visit, are you checking your blood glucose at least once a day? Yes           Individualized Plan for Diabetes Self-Management Training:   Learning Objective:  Patient will have a greater understanding of diabetes self-management. Patient education plan is to attend individual and/or group sessions per assessed needs and concerns.   Plan:   Patient Instructions  Thank  you for coming in today!  I will ask your doctor for a referral to an eye doctor.  Your goal set today is: STOP SMOKING  You also said you want to get your COVID-19 Booster vaccine. That's great!  You  can ask your doctor about getting a tetanus shot and colonoscopy because they are showing due on your chart.   Your A1C today is: 6.1%- keep doing what doing!!!  This means that your blood sugars have been  About 128 for the past 3 months on average  To improve your good cholesterol( HDL) try to eat more nuts like peanut butter, olives, olive oil, salmon, tuna, avocados, fruit or muffins instead of cakes and cookies,   [name] [phone]          Expected Outcomes:  Demonstrated interest in learning. Expect positive outcomes Education material provided: Diabetes Resources If problems or questions, patient to contact team via:  Phone Future DSME appointment: 2 months  [name], RD 07/01/2020 11:33 AM.

## 2020-07-05 ENCOUNTER — Other Ambulatory Visit: Payer: Medicare Other

## 2020-07-05 ENCOUNTER — Other Ambulatory Visit: Payer: Self-pay

## 2020-07-05 DIAGNOSIS — Z1211 Encounter for screening for malignant neoplasm of colon: Secondary | ICD-10-CM

## 2020-07-06 LAB — FECAL OCCULT BLOOD, IMMUNOCHEMICAL: Fecal Occult Bld: NEGATIVE

## 2020-07-12 ENCOUNTER — Other Ambulatory Visit: Payer: Self-pay

## 2020-07-12 ENCOUNTER — Ambulatory Visit: Payer: Medicare Other | Admitting: Orthotics

## 2020-07-12 ENCOUNTER — Other Ambulatory Visit (HOSPITAL_COMMUNITY): Payer: Self-pay | Admitting: Internal Medicine

## 2020-07-12 DIAGNOSIS — E1142 Type 2 diabetes mellitus with diabetic polyneuropathy: Secondary | ICD-10-CM

## 2020-07-12 DIAGNOSIS — I2510 Atherosclerotic heart disease of native coronary artery without angina pectoris: Secondary | ICD-10-CM

## 2020-07-12 DIAGNOSIS — I739 Peripheral vascular disease, unspecified: Secondary | ICD-10-CM

## 2020-07-12 MED FILL — FUROSEMIDE 40 MG TAB: 40 | 30 days supply | Qty: 60 | Fill #0

## 2020-07-12 MED FILL — CLOPIDOGREL 75 MG TABLET: 75 | 90 days supply | Qty: 90 | Fill #3

## 2020-07-15 ENCOUNTER — Ambulatory Visit: Payer: Medicare Other | Attending: Internal Medicine

## 2020-07-15 DIAGNOSIS — Z23 Encounter for immunization: Secondary | ICD-10-CM

## 2020-07-15 NOTE — Progress Notes (Signed)
   Covid-19 Vaccination Clinic  Name:  DELRICO MINEHART    MRN: [id number] DOB: 1950/08/14  07/15/2020  Mr. Blackburn was observed post Covid-19 immunization for 15 minutes without incident. He was provided with Vaccine Information Sheet and instruction to access the V-Safe system.   Mr. Oshita was instructed to call 911 with any severe reactions post vaccine: Marland Kitchen Difficulty breathing  . Swelling of face and throat  . A fast heartbeat  . A bad rash all over body  . Dizziness and weakness   Immunizations Administered    Name Date Dose VIS Date Route   Pfizer COVID-19 Vaccine 07/15/2020  1:48 PM 0.3 mL 05/12/2020 Intramuscular   Manufacturer: [employer], [employer]   Lot: [id number]   NDC: [address]-7

## 2020-07-20 ENCOUNTER — Other Ambulatory Visit (HOSPITAL_COMMUNITY): Payer: Self-pay | Admitting: Internal Medicine

## 2020-07-20 MED FILL — AMLODIPINE BESYLATE 5 MG TA: 5 | 30 days supply | Qty: 30 | Fill #0

## 2020-07-20 MED FILL — CORLANOR 5 MG TABLET: 5 | 90 days supply | Qty: 90 | Fill #2

## 2020-07-26 ENCOUNTER — Other Ambulatory Visit: Payer: Medicare Other | Admitting: Orthotics

## 2020-07-28 ENCOUNTER — Ambulatory Visit (INDEPENDENT_AMBULATORY_CARE_PROVIDER_SITE_OTHER): Payer: Medicare Other | Admitting: Podiatry

## 2020-07-28 ENCOUNTER — Other Ambulatory Visit: Payer: Self-pay

## 2020-07-28 ENCOUNTER — Encounter: Payer: Self-pay | Admitting: Podiatry

## 2020-07-28 DIAGNOSIS — M79675 Pain in left toe(s): Secondary | ICD-10-CM | POA: Diagnosis not present

## 2020-07-28 DIAGNOSIS — E1142 Type 2 diabetes mellitus with diabetic polyneuropathy: Secondary | ICD-10-CM

## 2020-07-28 DIAGNOSIS — N1832 Chronic kidney disease, stage 3b: Secondary | ICD-10-CM

## 2020-07-28 DIAGNOSIS — B351 Tinea unguium: Secondary | ICD-10-CM

## 2020-07-28 DIAGNOSIS — M79674 Pain in right toe(s): Secondary | ICD-10-CM

## 2020-07-29 NOTE — Progress Notes (Signed)
Subjective:  Patient ID: Eddie [name], male    DOB: 1951/01/05,  MRN: [id number]  70 y.o. male presents with at risk foot care with history of diabetic neuropathy and painful thick toenails that are difficult to trim. Pain interferes with ambulation. Aggravating factors include wearing enclosed shoe gear. Pain is relieved with periodic professional debridement.  Eddie [name] has h/o CKD stage 3b.  He continues to smoke.   He did not check blood glucose this morning. States it was 140 mg/dl yesterday. His daily readings stay within the 150's per patient.  PCP: Eddie Groves, DO and last visit was: 01/01/2020.  Review of Systems: Negative except as noted in the HPI.  Past Medical History:  Diagnosis Date  . Blindness of left eye   . CAD (coronary artery disease)    a. STEMI 09/2015 w/ DES to Prox LAD  . CVA (cerebral infarction)    a. 09/2015: acute small right frontal lobe infarct  . Diabetes ([place])   . Hypertension   . Ischemic cardiomyopathy   . OSA (obstructive sleep apnea) 07/06/2017   Mild OSA with AHI of 8.2/hr with hypoxemia as low as 76% now on CPAP at 11cm H2O.  . PUD (peptic ulcer disease)   . ST elevation myocardial infarction involving left anterior descending (LAD) coronary artery ([place]) 09/28/2015   Past Surgical History:  Procedure Laterality Date  . CARDIAC CATHETERIZATION N/A 09/28/2015   Procedure: Left Heart Cath and Coronary Angiography;  Surgeon: Lorretta Harp, MD;  Location: [facility];  Service: Cardiovascular;  Laterality: N/A;  . CARDIAC CATHETERIZATION N/A 10/08/2015   Procedure: Left Heart Cath and Coronary Angiography;  Surgeon: Belva Crome, MD;  Location: [facility];  Service: Cardiovascular;  Laterality: N/A;  . CATARACT EXTRACTION Right   . Repair of Peptic Ulcer     Patient Active Problem List   Diagnosis Date Noted  . Impingement syndrome of right shoulder 01/01/2020  . COPD (chronic obstructive pulmonary disease) with emphysema  ([place]) 07/10/2019  . Complaining of cold hands 10/04/2018  . Pain in right shoulder 05/30/2018  . Primary open angle glaucoma of both eyes, severe stage 03/07/2018  . Cataract of left eye 03/07/2018  . 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 OSA (obstructive sleep apnea) 07/06/2017  . Shoulder impingement syndrome, left 04/13/2017  . Dyspnea on exertion 12/08/2016  . CAD in native artery 10/06/2016  . CKD (chronic kidney disease) stage 3, GFR 30-59 ml/min (H[facility]) 10/06/2016  . Health care maintenance 10/06/2016  . Esophageal reflux   . Chronic combined systolic and diastolic heart failure ([place])   . Gait disturbance, post-stroke 12/09/2015  . Tobacco abuse   . Essential hypertension   . Controlled type 2 diabetes mellitus with diabetic neuropathy, without long-term current use of insulin ([place])   . History of CVA (cerebrovascular accident)   . Cataract, nuclear sclerotic, left eye 03/15/2012  . Pseudophakia of right eye 03/15/2012    Current Outpatient Medications:  .  Accu-Chek FastClix Lancets MISC, Use to check blood sugar 1 time a day, Disp: 102 each, Rfl: 3 .  Acetaminophen 325 MG CAPS, Take 2 tablets by mouth every 4 (four) hours as needed., Disp: , Rfl:  .  albuterol (PROVENTIL HFA;VENTOLIN HFA) 108 (90 Base) MCG/ACT inhaler, Inhale 2 puffs into the lungs every 6 (six) hours as needed for wheezing or shortness of breath., Disp: 1 Inhaler, Rfl: 2 .  amLODipine (NORVASC) 5 MG tablet, TAKE 1 TABLET (  5 MG TOTAL) BY MOUTH DAILY., Disp: 30 tablet, Rfl: 3 .  aspirin EC 81 MG tablet, Take 1 tablet (81 mg total) by mouth daily., Disp: 90 tablet, Rfl: 3 .  atorvastatin (LIPITOR) 80 MG tablet, Take 1 tablet (80 mg total) by mouth daily., Disp: 90 tablet, Rfl: 3 .  bimatoprost (LUMIGAN) 0.03 % ophthalmic solution, Place 1 drop into the right eye at bedtime., Disp: , Rfl:  .  Blood Glucose Monitoring Suppl (ACCU-CHEK GUIDE) w/Device KIT, 1 kit by Does not apply route daily., Disp: 1 kit,  Rfl: 0 .  brimonidine (ALPHAGAN) 0.2 % ophthalmic solution, Apply to eye., Disp: , Rfl:  .  carvedilol (COREG) 25 MG tablet, Take 1 tablet (25 mg total) by mouth 2 (two) times daily., Disp: 180 tablet, Rfl: 3 .  clopidogrel (PLAVIX) 75 MG tablet, TAKE 1 TABLET (75 MG TOTAL) BY MOUTH DAILY., Disp: 90 tablet, Rfl: 3 .  CORLANOR 5 MG TABS tablet, TAKE 1/2 TABLET BY MOUTH 2 TIMES DAILY WITH A MEAL., Disp: 90 tablet, Rfl: 3 .  dorzolamide-timolol (COSOPT) 22.3-6.8 MG/ML ophthalmic solution, 1 drop 2 (two) times daily., Disp: , Rfl:  .  ENTRESTO 97-103 MG, TAKE 1 TABLET BY MOUTH 2 TIMES DAILY., Disp: 180 tablet, Rfl: 3 .  eplerenone (INSPRA) 25 MG tablet, Take 1 tablet (25 mg total) by mouth daily., Disp: 90 tablet, Rfl: 3 .  furosemide (LASIX) 40 MG tablet, TAKE 1 TABLET (40 MG TOTAL) BY MOUTH 2 (TWO) TIMES DAILY., Disp: 60 tablet, Rfl: 3 .  glucose blood (ACCU-CHEK GUIDE) test strip, Use to check blood sugar once daily, Disp: 100 each, Rfl: 12 .  Insulin Pen Needle 32G X 4 MM MISC, Use to inject insulin one time a day, Disp: 100 each, Rfl: 5 .  JARDIANCE 10 MG TABS tablet, TAKE 1 TABLET BY MOUTH DAILY BEFORE BREAKFAST., Disp: 90 tablet, Rfl: 3 .  latanoprost (XALATAN) 0.005 % ophthalmic solution, SMARTSIG:1 Drop(s) In Eye(s) Every Evening, Disp: , Rfl:  .  liraglutide (VICTOZA) 18 MG/3ML SOPN, Inject 0.1 mLs (0.6 mg total) into the skin daily., Disp: 9 mL, Rfl: 3 .  metFORMIN (GLUCOPHAGE) 1000 MG tablet, Take 1 tablet (1,000 mg total) by mouth 2 (two) times daily with a meal., Disp: 180 tablet, Rfl: 3 .  nitroGLYCERIN (NITROSTAT) 0.4 MG SL tablet, Use one pill every 5 minutes X 3 if needed for chest pain. Contact MD if pain does not improve, Disp: 30 tablet, Rfl: 0 Allergies  Allergen Reactions  . Bidil [Isosorb Dinitrate-Hydralazine] Other (See Comments)    Patient became lightheaded with 1 tablet tid.    Social History   Tobacco Use  Smoking Status Current Some Day Smoker  . Packs/day: 0.20   . Types: Cigarettes  Smokeless Tobacco Never Used   Objective:  There were no vitals filed for this visit. Constitutional Patient is a pleasant 70 y.o. African American male in NAD.Marland Kitchen AAO x 3.  Vascular Capillary fill time to digits <3 seconds b/l lower extremities. Palpable pedal pulses b/l LE. Pedal hair absent. Lower extremity skin temperature gradient within normal limits. No pain with calf compression b/l. No ischemia or gangrene noted b/l lower extremities. No cyanosis or clubbing noted.  Neurologic Normal speech. Oriented to person, place, and time. Protective sensation decreased with 10 gram monofilament b/l. Vibratory sensation intact b/l.  Dermatologic Pedal skin with normal turgor, texture and tone bilaterally. No open wounds bilaterally. No interdigital macerations bilaterally. Toenails 1-5 b/l elongated, discolored, dystrophic, thickened, crumbly  with subungual debris and tenderness to dorsal palpation.  Orthopedic: Normal muscle strength 5/5 to all lower extremity muscle groups bilaterally. No pain crepitus or joint limitation noted with ROM b/l. No gross bony deformities bilaterally.   Hemoglobin A1C Latest Ref Rng & Units 07/01/2020 01/01/2020  HGBA1C 4.0 - 5.6 % 6.1(A) 6.4(A)  Some recent data might be hidden   Assessment:   1. Pain due to onychomycosis of toenails of both feet   2. Stage 3b chronic kidney disease ([place])   3. Diabetic peripheral neuropathy associated with type 2 diabetes mellitus ([place])    Plan:  Patient was evaluated and treated and all questions answered.  Onychomycosis with pain -Nails palliatively debridement as below. -Educated on self-care  Procedure: Nail Debridement Rationale: Pain Type of Debridement: manual, sharp debridement. Instrumentation: Nail nipper, rotary burr. Number of Nails: 10  -Examined patient. -Continue diabetic foot care principles. -Toenails 1-5 b/l were debrided in length and girth with sterile nail nippers and dremel  without iatrogenic bleeding.  -Patient to report any pedal injuries to medical professional immediately. -Patient to continue soft, supportive shoe gear daily. Start procedure for diabetic shoes. Patient qualifies based on diagnoses. -Patient/POA to call should there be question/concern in the interim.  Return in about 3 months (around 10/26/2020).  Marzetta Board, DPM

## 2020-08-16 MED FILL — CARVEDILOL 25 MG TABLET: 25 | 90 days supply | Qty: 180 | Fill #2

## 2020-08-16 MED FILL — AMLODIPINE BESYLATE 5 MG TA: 5 | 30 days supply | Qty: 30 | Fill #1

## 2020-08-16 MED FILL — ATORVASTATIN 80 MG TABLET: 80 | 90 days supply | Qty: 90 | Fill #1

## 2020-08-25 ENCOUNTER — Other Ambulatory Visit: Payer: Self-pay

## 2020-08-25 ENCOUNTER — Ambulatory Visit (HOSPITAL_COMMUNITY)
Admission: RE | Admit: 2020-08-25 | Discharge: 2020-08-25 | Disposition: A | Discharge status: Home / Self Care | Payer: Medicare Other | Source: Ambulatory Visit | Attending: Internal Medicine | Admitting: Internal Medicine

## 2020-08-25 ENCOUNTER — Other Ambulatory Visit (HOSPITAL_COMMUNITY): Payer: Self-pay | Admitting: Internal Medicine

## 2020-08-25 VITALS — BP 128/85 | HR 85 | Wt 158.4 lb

## 2020-08-25 DIAGNOSIS — Z7982 Long term (current) use of aspirin: Secondary | ICD-10-CM | POA: Insufficient documentation

## 2020-08-25 DIAGNOSIS — Z7902 Long term (current) use of antithrombotics/antiplatelets: Secondary | ICD-10-CM | POA: Insufficient documentation

## 2020-08-25 DIAGNOSIS — R0602 Shortness of breath: Secondary | ICD-10-CM | POA: Diagnosis present

## 2020-08-25 DIAGNOSIS — I5042 Chronic combined systolic (congestive) and diastolic (congestive) heart failure: Secondary | ICD-10-CM | POA: Diagnosis not present

## 2020-08-25 DIAGNOSIS — I2510 Atherosclerotic heart disease of native coronary artery without angina pectoris: Secondary | ICD-10-CM

## 2020-08-25 DIAGNOSIS — Z7901 Long term (current) use of anticoagulants: Secondary | ICD-10-CM | POA: Diagnosis not present

## 2020-08-25 DIAGNOSIS — G4733 Obstructive sleep apnea (adult) (pediatric): Secondary | ICD-10-CM | POA: Diagnosis not present

## 2020-08-25 DIAGNOSIS — I5022 Chronic systolic (congestive) heart failure: Secondary | ICD-10-CM | POA: Diagnosis not present

## 2020-08-25 DIAGNOSIS — N1832 Chronic kidney disease, stage 3b: Secondary | ICD-10-CM

## 2020-08-25 DIAGNOSIS — I130 Hypertensive heart and chronic kidney disease with heart failure and stage 1 through stage 4 chronic kidney disease, or unspecified chronic kidney disease: Secondary | ICD-10-CM | POA: Diagnosis not present

## 2020-08-25 DIAGNOSIS — Z720 Tobacco use: Secondary | ICD-10-CM

## 2020-08-25 DIAGNOSIS — Z955 Presence of coronary angioplasty implant and graft: Secondary | ICD-10-CM | POA: Insufficient documentation

## 2020-08-25 DIAGNOSIS — Z794 Long term (current) use of insulin: Secondary | ICD-10-CM | POA: Insufficient documentation

## 2020-08-25 DIAGNOSIS — Z8673 Personal history of transient ischemic attack (TIA), and cerebral infarction without residual deficits: Secondary | ICD-10-CM | POA: Insufficient documentation

## 2020-08-25 DIAGNOSIS — Z79899 Other long term (current) drug therapy: Secondary | ICD-10-CM | POA: Insufficient documentation

## 2020-08-25 DIAGNOSIS — E1122 Type 2 diabetes mellitus with diabetic chronic kidney disease: Secondary | ICD-10-CM | POA: Diagnosis not present

## 2020-08-25 DIAGNOSIS — I252 Old myocardial infarction: Secondary | ICD-10-CM | POA: Insufficient documentation

## 2020-08-25 DIAGNOSIS — F17210 Nicotine dependence, cigarettes, uncomplicated: Secondary | ICD-10-CM | POA: Diagnosis not present

## 2020-08-25 DIAGNOSIS — I10 Essential (primary) hypertension: Secondary | ICD-10-CM

## 2020-08-25 LAB — CBC
HCT: 33.2 % — ABNORMAL LOW (ref 39.0–52.0)
Hemoglobin: 10.3 g/dL — ABNORMAL LOW (ref 13.0–17.0)
MCH: 25.2 pg — ABNORMAL LOW (ref 26.0–34.0)
MCHC: 31 g/dL (ref 30.0–36.0)
MCV: 81.2 fL (ref 80.0–100.0)
Platelets: 275 10*3/uL (ref 150–400)
RBC: 4.09 MIL/uL — ABNORMAL LOW (ref 4.22–5.81)
RDW: 14 % (ref 11.5–15.5)
WBC: 5.5 10*3/uL (ref 4.0–10.5)
nRBC: 0 % (ref 0.0–0.2)

## 2020-08-25 LAB — LIPID PANEL
Cholesterol: 120 mg/dL (ref 0–200)
HDL: 43 mg/dL (ref >40)
LDL Cholesterol: 61 mg/dL (ref 0–99)
Total CHOL/HDL Ratio: 2.8 RATIO
Triglycerides: 82 mg/dL (ref <150)
VLDL: 16 mg/dL (ref 0–40)

## 2020-08-25 LAB — COMPREHENSIVE METABOLIC PANEL
ALT: 20 U/L (ref 0–44)
AST: 21 U/L (ref 15–41)
Albumin: 3.8 g/dL (ref 3.5–5.0)
Alkaline Phosphatase: 62 U/L (ref 38–126)
Anion gap: 11 (ref 5–15)
BUN: 24 mg/dL — ABNORMAL HIGH (ref 8–23)
CO2: 27 mmol/L (ref 22–32)
Calcium: 9.2 mg/dL (ref 8.9–10.3)
Chloride: 102 mmol/L (ref 98–111)
Creatinine, Ser: 1.95 mg/dL — ABNORMAL HIGH (ref 0.61–1.24)
GFR, Estimated: 37 mL/min — ABNORMAL LOW (ref >60)
Glucose, Bld: 138 mg/dL — ABNORMAL HIGH (ref 70–99)
Potassium: 4.8 mmol/L (ref 3.5–5.1)
Sodium: 140 mmol/L (ref 135–145)
Total Bilirubin: 0.7 mg/dL (ref 0.3–1.2)
Total Protein: 7.3 g/dL (ref 6.5–8.1)

## 2020-08-25 LAB — BRAIN NATRIURETIC PEPTIDE: B Natriuretic Peptide: 45 pg/mL (ref 0.0–100.0)

## 2020-08-25 MED ORDER — IVABRADINE HCL 5 MG PO TABS
5.0000 mg | ORAL_TABLET | Freq: Two times a day (BID) | ORAL | 5 refills | Status: DC
Start: 2020-08-25 — End: 2021-02-22

## 2020-08-25 MED FILL — CORLANOR 5 MG TABLET: 5 | 90 days supply | Qty: 180 | Fill #0

## 2020-08-25 NOTE — Patient Instructions (Signed)
Increase Corlandor to 5 mg Twice daily   Your physician has requested that you have an echocardiogram. Echocardiography is a painless test that uses sound waves to create images of your heart. It provides your doctor with information about the size and shape of your heart and how well your heart's chambers and valves are working. This procedure takes approximately one hour. There are no restrictions for this procedure.  Labs done today,  we will contact you for abnormal readings.   Your physician recommends that you schedule a follow-up appointment in: 6 months  If you have any questions or concerns before your next appointment please send [place] a message through [place] or call our office at [phone].    TO LEAVE A MESSAGE FOR THE NURSE SELECT OPTION 2, PLEASE LEAVE A MESSAGE INCLUDING: . YOUR NAME . DATE OF BIRTH . CALL BACK NUMBER . REASON FOR CALL**this is important as we prioritize the call backs  YOU WILL RECEIVE A CALL BACK THE SAME DAY AS LONG AS YOU CALL BEFORE 4:00 PM  At the [facility], you and your health needs are our priority. As part of our continuing mission to provide you with exceptional heart care, we have created designated Provider Care Teams. These Care Teams include your primary Cardiologist (physician) and Advanced Practice Providers (APPs- Physician Assistants and Nurse Practitioners) who all work together to provide you with the care you need, when you need it.   You may see any of the following providers on your designated Care Team at your next follow up: Marland Kitchen Dr Arvilla Meres . Dr Marca Ancona . Tonye Becket, NP . Robbie Lis, PA . Shanda Bumps [place],NP . Karle Plumber, PharmD   Please be sure to bring in all your medications bottles to every appointment.

## 2020-08-25 NOTE — Progress Notes (Signed)
[facility] Note   PCP: Joni Reining HF MD: Dr Haroldine Laws  HPI: OSAZE [name] is a 70 y.o. male  with h/o DM2, CAD s/p NSTEMI with LAD stent in 3/17, CVA, chronic systolic HF with previous EF 15-20%.   Had multiple admissions in May and June 2017 in the setting of decompensated CHF and EColi bacteremia. Readmitted in June 2017 with acute respiratory failure requiring intubation in setting of severe HTN and ADHF. Treated with milrinone which was eventually weaned off.     EF 11/20  improved to 40-45% and has done well   Today he returns for HF follow up with his wife.  Feels good. Getting around the house with his cane. Using cane. Still smoking a few cigs per day. Using CPAP every night. Mild exertional SOB. Rare fleeting CP. No edema, orthopnea or PND. No problems with medications.   Echo 05/2019 EF 40-45%  Echo 12/19 EF 30-35%  Echo 01/11/17: EF 40%.   RV ok.  ECHO 06/19/2016: EF 20-25%.Cannot exclude small  laminar apical thrombus. No mobile or protuberant thrombus is  seen. Echo 5/15 EF 15-20%  Review of systems complete and found to be negative unless listed in HPI.    Past Medical History:  Diagnosis Date  . Blindness of left eye   . CAD (coronary artery disease)    a. STEMI 09/2015 w/ DES to Prox LAD  . CVA (cerebral infarction)    a. 09/2015: acute small right frontal lobe infarct  . Diabetes ([place])   . Hypertension   . Ischemic cardiomyopathy   . OSA (obstructive sleep apnea) 07/06/2017   Mild OSA with AHI of 8.2/hr with hypoxemia as low as 76% now on CPAP at 11cm H2O.  . PUD (peptic ulcer disease)   . ST elevation myocardial infarction involving left anterior descending (LAD) coronary artery ([place]) 09/28/2015    Current Outpatient Medications  Medication Sig Dispense Refill  . Accu-Chek FastClix Lancets MISC Use to check blood sugar 1 time a day 102 each 3  . Acetaminophen 325 MG CAPS Take 2 tablets by mouth every 4 (four) hours as needed.    Marland Kitchen  albuterol (PROVENTIL HFA;VENTOLIN HFA) 108 (90 Base) MCG/ACT inhaler Inhale 2 puffs into the lungs every 6 (six) hours as needed for wheezing or shortness of breath. 1 Inhaler 2  . amLODipine (NORVASC) 5 MG tablet TAKE 1 TABLET (5 MG TOTAL) BY MOUTH DAILY. 30 tablet 3  . aspirin EC 81 MG tablet Take 1 tablet (81 mg total) by mouth daily. 90 tablet 3  . atorvastatin (LIPITOR) 80 MG tablet Take 1 tablet (80 mg total) by mouth daily. 90 tablet 3  . bimatoprost (LUMIGAN) 0.03 % ophthalmic solution Place 1 drop into the right eye at bedtime.    . Blood Glucose Monitoring Suppl (ACCU-CHEK GUIDE) w/Device KIT 1 kit by Does not apply route daily. 1 kit 0  . brimonidine (ALPHAGAN) 0.2 % ophthalmic solution Apply to eye.    . carvedilol (COREG) 25 MG tablet Take 1 tablet (25 mg total) by mouth 2 (two) times daily. 180 tablet 3  . clopidogrel (PLAVIX) 75 MG tablet TAKE 1 TABLET (75 MG TOTAL) BY MOUTH DAILY. 90 tablet 3  . CORLANOR 5 MG TABS tablet TAKE 1/2 TABLET BY MOUTH 2 TIMES DAILY WITH A MEAL. 90 tablet 3  . dorzolamide-timolol (COSOPT) 22.3-6.8 MG/ML ophthalmic solution 1 drop 2 (two) times daily.    Marland Kitchen ENTRESTO 97-103 MG TAKE 1 TABLET  BY MOUTH 2 TIMES DAILY. 180 tablet 3  . eplerenone (INSPRA) 25 MG tablet Take 1 tablet (25 mg total) by mouth daily. 90 tablet 3  . furosemide (LASIX) 40 MG tablet TAKE 1 TABLET (40 MG TOTAL) BY MOUTH 2 (TWO) TIMES DAILY. 60 tablet 3  . glucose blood (ACCU-CHEK GUIDE) test strip Use to check blood sugar once daily 100 each 12  . Insulin Pen Needle 32G X 4 MM MISC Use to inject insulin one time a day 100 each 5  . JARDIANCE 10 MG TABS tablet TAKE 1 TABLET BY MOUTH DAILY BEFORE BREAKFAST. 90 tablet 3  . latanoprost (XALATAN) 0.005 % ophthalmic solution SMARTSIG:1 Drop(s) In Eye(s) Every Evening    . liraglutide (VICTOZA) 18 MG/3ML SOPN Inject 0.1 mLs (0.6 mg total) into the skin daily. 9 mL 3  . metFORMIN (GLUCOPHAGE) 1000 MG tablet Take 1 tablet (1,000 mg total) by mouth 2  (two) times daily with a meal. 180 tablet 3  . nitroGLYCERIN (NITROSTAT) 0.4 MG SL tablet Use one pill every 5 minutes X 3 if needed for chest pain. Contact MD if pain does not improve 30 tablet 0   No current facility-administered medications for this encounter.    Allergies  Allergen Reactions  . Bidil [Isosorb Dinitrate-Hydralazine] Other (See Comments)    Patient became lightheaded with 1 tablet tid.       Social History   Socioeconomic History  . Marital status: Married    Spouse name: Not on file  . Number of children: Not on file  . Years of education: Not on file  . Highest education level: Not on file  Occupational History  . Occupation: Electrical engineer: [employer]  Tobacco Use  . Smoking status: Current Some Day Smoker    Packs/day: 0.20    Types: Cigarettes  . Smokeless tobacco: Never Used  Substance and Sexual Activity  . Alcohol use: No    Alcohol/week: 0.0 standard drinks  . Drug use: No  . Sexual activity: Not on file  Other Topics Concern  . Not on file  Social History Narrative  . Not on file   Social Determinants of Health   Financial Resource Strain: Not on file  Food Insecurity: Not on file  Transportation Needs: Not on file  Physical Activity: Not on file  Stress: Not on file  Social Connections: Not on file  Intimate Partner Violence: Not on file      Family History  Problem Relation Age of Onset  . Diabetes Mother   . Cancer Neg Hx     Vitals:   08/25/20 1046  BP: 128/85  Pulse: 85  SpO2: 98%  Weight: 71.8 kg (158 lb 6.4 oz)   Wt Readings from Last 3 Encounters:  08/25/20 71.8 kg (158 lb 6.4 oz)  07/01/20 68.5 kg (151 lb)  03/17/20 66.8 kg (147 lb 6 oz)     PHYSICAL EXAM: General:  Thin elderly. No resp difficulty HEENT: normal Neck: supple. no JVD. Carotids 2+ bilat; no bruits. No lymphadenopathy or thryomegaly appreciated. Cor: PMI nondisplaced. Regular rate & rhythm. No rubs, gallops or  murmurs. Lungs: clear Abdomen: soft, nontender, nondistended. No hepatosplenomegaly. No bruits or masses. Good bowel sounds. Extremities: no cyanosis, clubbing, rash, edema Neuro: alert & orientedx3, cranial nerves grossly intact. moves all 4 extremities w/o difficulty. Affect pleasant  ASSESSMENT & PLAN: 1. Chronicsystolic CHF : ICM May 4782 EF 15-20% and 12/17 EF 25-30%. Echo  12/2016 EF improved 40% - Echo 12/19 EF 30-35%. - Echo 05/2019 EF 40-45% - Stable NYHA II-III - Volume status stable. Continue  lasix  40 bid  - Continue Entresto 97/103 mg BID.  - Continue Coreg 25 mg BID - Continue inspra 25 mg daily - Intolerant to Bidil in the past with dizziness and headaches.  - Increase Corlanor to 5 bid.  - Continue jardiance 10 mg daily.  - Due for repeat echo - check labs    2. CAD: s/p DES to proximal LAD in 09/2015  - No significant angina - Continue ASA, Plavix and atorvastatin.  - LDL at goal 61  3. HTN - Blood pressure well controlled. Continue current regimen.  4. 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  - Continue DAPT   5. DMII - Per PCP. On metformin and Victoza  - Continue jardiance 10 mg daily  6. OSA  -Continue CPAP every night. .   7.  Tobacco use - Discussed smoking cessation  8. CKD Stage IIIb - last creatinine 2.1 -> 1.6 - check labs today - on Jardiance  Glori Bickers, MD  10:51 AM

## 2020-08-25 NOTE — Addendum Note (Signed)
Encounter addended by: Suezanne Cheshire, RN on: 08/25/2020 11:33 AM  Actions taken: Order Reconciliation Section accessed

## 2020-08-25 NOTE — Addendum Note (Signed)
Encounter addended by: Suezanne Cheshire, RN on: 08/25/2020 11:29 AM  Actions taken: Visit diagnoses modified, Diagnosis association updated, Order list changed, Clinical Note Signed, Charge Capture section accepted

## 2020-08-26 ENCOUNTER — Ambulatory Visit (INDEPENDENT_AMBULATORY_CARE_PROVIDER_SITE_OTHER): Payer: Medicare Other | Admitting: Internal Medicine

## 2020-08-26 ENCOUNTER — Encounter: Payer: Self-pay | Admitting: Dietician

## 2020-08-26 ENCOUNTER — Other Ambulatory Visit: Payer: Self-pay

## 2020-08-26 ENCOUNTER — Encounter: Payer: Self-pay | Admitting: Internal Medicine

## 2020-08-26 ENCOUNTER — Ambulatory Visit (INDEPENDENT_AMBULATORY_CARE_PROVIDER_SITE_OTHER): Payer: Medicare Other | Admitting: Dietician

## 2020-08-26 VITALS — BP 117/66 | HR 80 | Temp 98.5°F | Ht 66.0 in | Wt 160.1 lb

## 2020-08-26 DIAGNOSIS — J439 Emphysema, unspecified: Secondary | ICD-10-CM | POA: Diagnosis not present

## 2020-08-26 DIAGNOSIS — I5042 Chronic combined systolic (congestive) and diastolic (congestive) heart failure: Secondary | ICD-10-CM

## 2020-08-26 DIAGNOSIS — E1140 Type 2 diabetes mellitus with diabetic neuropathy, unspecified: Secondary | ICD-10-CM

## 2020-08-26 DIAGNOSIS — Z713 Dietary counseling and surveillance: Secondary | ICD-10-CM | POA: Diagnosis not present

## 2020-08-26 DIAGNOSIS — N1832 Chronic kidney disease, stage 3b: Secondary | ICD-10-CM

## 2020-08-26 DIAGNOSIS — M7541 Impingement syndrome of right shoulder: Secondary | ICD-10-CM

## 2020-08-26 DIAGNOSIS — I10 Essential (primary) hypertension: Secondary | ICD-10-CM

## 2020-08-26 LAB — POCT GLYCOSYLATED HEMOGLOBIN (HGB A1C): Hemoglobin A1C: 6.6 % — AB (ref 4.0–5.6)

## 2020-08-26 LAB — GLUCOSE, CAPILLARY: Glucose-Capillary: 155 mg/dL — ABNORMAL HIGH (ref 70–99)

## 2020-08-26 NOTE — Progress Notes (Signed)
Diabetes Self-Management Education  Visit Type: Follow-up  Appt. Start Time: 0845 Appt. End Time: 0915  08/26/2020  [name], identified by name and date of birth, is a 70 y.o. male with a diagnosis of Diabetes:  .   ASSESSMENT  Weight 160 lb 1.6 oz (72.6 kg). Body mass index is 25.84 kg/m.   Wt Readings from Last 5 Encounters:  08/26/20 160 lb 1.6 oz (72.6 kg)  08/26/20 160 lb 1.6 oz (72.6 kg)  08/25/20 158 lb 6.4 oz (71.8 kg)  07/01/20 151 lb (68.5 kg)  03/17/20 147 lb 6 oz (66.8 kg)   Lab Results  Component Value Date   HGBA1C 6.6 (A) 08/26/2020   HGBA1C 6.1 (A) 07/01/2020   HGBA1C 6.4 (A) 01/01/2020   HGBA1C 6.7 (A) 07/10/2019   HGBA1C 6.0 (A) 01/23/2019    BP Readings from Last 3 Encounters:  08/26/20 117/66  08/25/20 128/85  03/17/20 110/66    Regarding his goal to STOP SMOKING- no change- wants to quit- agreed to call [place] quitline  He got his covid-19 booster Weight is increasing for unknown reasons per patient.  plavix increased to whole pill twice a day  Diabetes Self-Management Education - 08/26/20 1000      Visit Information   Visit Type Follow-up      Health Coping   How would you rate your overall health? Good      Complications   Last HgB A1C per patient/outside source 6.6 %    How often do you check your blood sugar? 1-2 times/day    Have you had a dilated eye exam in the past 12 months? No   requested a referral   Have you had a dental exam in the past 12 months? Yes    Are you checking your feet? Yes    How many days per week are you checking your feet? 7      Dietary Intake   Dinner mac. cheese, green beans, chicken fro ma local church    Beverage(s) regular soda, sweet tea, water      Exercise   Exercise Type ADL's;Light (walking / raking leaves)      Patient Self-Evaluation of Goals - Patient rates self as meeting previously set goals (% of time)   Reducing Risk < 25%      Outcomes   Expected Outcomes Demonstrated interest  in learning. Expect positive outcomes    Future DMSE 6 months    Program Status Completed      Subsequent Visit   Since your last visit have you continued or begun to take your medications as prescribed? Yes    Since your last visit have you had your blood pressure checked? Yes    Is your most recent blood pressure lower, unchanged, or higher since your last visit? Unchanged    Since your last visit have you experienced any weight changes? Gain    Weight Gain (lbs) 9    Since your last visit, are you checking your blood glucose at least once a day? Yes           Individualized Plan for Diabetes Self-Management Training:   Learning Objective:  Patient will have a greater understanding of diabetes self-management. Patient education plan is to attend individual and/or group sessions per assessed needs and concerns.   Plan:   Patient Instructions  Eddie [name],   To help you with your goal of quitting smoking, we recommend you call the [place] Quitline at ([phone].  They usually mail you patches for your quit date. They also call to follow up and see how you are doing.   Get your eyes examined when they call you about your appointment.   Try to eat drink mostly water and eat mostly fruits and vegetables- they are full of potassium and often low in salt.   Foods that contain Hidden SALT and healthier choices:  Breads and rolls- buy/use  whole wheat loaf bread, limit biscuits, cornbread and crackers Pizza- make it homemade- using low sodium sauce and vegetable toppings Sandwiches- buy low sodium lunchmeat/cold cuts, low sodium tuna, low sodium cheese like Swiss, add tomato lettuce and onion for flavor without salt Soups- low sodium  Or homemade soups Burritos and tacos- use low sodium taco sauce, low sodium cheese Savory snacks including chips, popcorn, pretzels and snack mixes- unsalted pretzels, fruit or vegetables, unsalted nuts Chicken- bake or boil or stew chicken with herbs and  vegetables Cheese- buy low sodium cheese like Swiss Eggs and omelets- use pepper, garlic and onions, salt free seasonings   your blood sugars are doing great!   We can follow up in 6 months.   Eddie [name] [phone]    Expected Outcomes:  Demonstrated interest in learning. Expect positive outcomes Education material provided: Diabetes Resources, quitting smoking and low sodium resources  If problems or questions, patient to contact team via:  Phone Future DSME appointment: 6 months - call in 1-2 months to follow up in use of quitline [employer] , RD 08/26/2020 10:14 AM.

## 2020-08-26 NOTE — Patient Instructions (Addendum)
Eddie [name],   To help you with your goal of quitting smoking, we recommend you call the [place] Quitline at ([phone].  They usually mail you patches for your quit date. They also call to follow up and see how you are doing.   Get your eyes examined when they call you about your appointment.   Try to eat drink mostly water and eat mostly fruits and vegetables- they are full of potassium and often low in salt.   Foods that contain Hidden SALT and healthier choices:  Breads and rolls- buy/use  whole wheat loaf bread, limit biscuits, cornbread and crackers Pizza- make it homemade- using low sodium sauce and vegetable toppings Sandwiches- buy low sodium lunchmeat/cold cuts, low sodium tuna, low sodium cheese like Swiss, add tomato lettuce and onion for flavor without salt Soups- low sodium  Or homemade soups Burritos and tacos- use low sodium taco sauce, low sodium cheese Savory snacks including chips, popcorn, pretzels and snack mixes- unsalted pretzels, fruit or vegetables, unsalted nuts Chicken- bake or boil or stew chicken with herbs and vegetables Cheese- buy low sodium cheese like Swiss Eggs and omelets- use pepper, garlic and onions, salt free seasonings   your blood sugars are doing great!   We can follow up in 6 months.   [name] [phone]

## 2020-08-30 MED FILL — FUROSEMIDE 40 MG TAB: 40 | 30 days supply | Qty: 60 | Fill #1

## 2020-08-31 ENCOUNTER — Telehealth: Payer: Self-pay | Admitting: *Deleted

## 2020-08-31 DIAGNOSIS — E1140 Type 2 diabetes mellitus with diabetic neuropathy, unspecified: Secondary | ICD-10-CM

## 2020-08-31 NOTE — Assessment & Plan Note (Signed)
HPI: No orthostatic symptoms.  No changes in vision.  No recent heart failure exacerbations.     Assessment essential hypertension well-controlled  Plan continue his home blood pressure medications which include  amlodipine 5 mg daily,  Entresto twice daily carvedilol 25 mg twice daily  eplerenone 25 mg daily. He also takes [place] which will have some effect on his blood pressure.

## 2020-08-31 NOTE — Assessment & Plan Note (Signed)
Chronic and stable.   

## 2020-08-31 NOTE — Progress Notes (Signed)
  Subjective:  HPI: [name] is a 70 y.o. male who presents for right shoulder pain, HTN, COPD, CHF follow up  Please see Assessment and Plan below for the status of his chronic medical problems.  Objective:  Physical Exam: Vitals:   08/26/20 0910  BP: 117/66  Pulse: 80  Temp: 98.5 F (36.9 C)  TempSrc: Oral  SpO2: 100%  Weight: 160 lb 1.6 oz (72.6 kg)  Height: 5\' 6"  (1.676 m)   Body mass index is 25.84 kg/m. Physical Exam Vitals and nursing note reviewed.  Constitutional:      Appearance: Normal appearance.  HENT:     Head: Normocephalic and atraumatic.  Pulmonary:     Effort: Pulmonary effort is normal.     Breath sounds: Normal breath sounds.  Musculoskeletal:        General: Normal range of motion.     Comments: Positive neers right  Skin:    Capillary Refill: Capillary refill takes less than 2 seconds.  Neurological:     General: No focal deficit present.     Mental Status: He is alert.  Psychiatric:        Mood and Affect: Mood normal.    Assessment & Plan:  See Encounters Tab for problem based charting.  Medications Ordered No orders of the defined types were placed in this encounter.  Other Orders Orders Placed This Encounter  Procedures  . Glucose, capillary  . POC Hbg A1C   Follow Up: Return 3-6 months.  PROCEDURE NOTE  PROCEDURE: right shoulder joint steroid injection.  PREOPERATIVE DIAGNOSIS: Bursitis of the right shoulder.  POSTOPERATIVE DIAGNOSIS: Bursitis of the right shoulder.  PROCEDURE: The patient was apprised of the risks and the benefits of the procedure and informed consent was obtained, as witnessed by [facility]. Time-out procedure was performed, with confirmation of the patient's name, date of birth, and correct identification of the right shoulder to be injected. The patient's shoulder was then marked at the appropriate site for injection placement. The shoulder was sterilely prepped with Betadine. A 40 mg (1 milliliter)  solution of Kenalog was drawn up into a 3 mL syringe with a 2 mL of 1% lidocaine. The patient was injected with a 25 gauge needle at the lateral  aspect of his  right shoulder. There were no complications. The patient tolerated the procedure well. There was minimal bleeding. The patient was instructed to ice her shoulder upon leaving clinic and refrain from overuse over the next 3 days. The patient was instructed to go to the emergency room with any usual pain, swelling, or redness occurred in the injected area. The patient was given a followup appointment to evaluate response to the injection to his increased range of motion and reduction of pain.

## 2020-08-31 NOTE — Assessment & Plan Note (Signed)
Chronic and stable euvolemic today.  Recently saw Dr. Madelynn Done last week, corlanor increased

## 2020-08-31 NOTE — Assessment & Plan Note (Signed)
HPI: Reports his right shoulder pain is back again.  Worse at night when laying on his right side.  Has painful arc.  No trauma.  Last steroid injection provided excellent relief for about 3 months requesting repeat injection.  Assessment impingement syndrome of right shoulder  Plan Repeat steroid injection performed today.

## 2020-08-31 NOTE — Assessment & Plan Note (Signed)
HPI: Denies any hyper or hypoglycemia tolerating medications well.  Affordable.  Assessment controlled type 2 diabetes mellitus with diabetic neuropathy, chronic kidney disease stage IIIb.  Plan Continue Metformin, Jardiance and Victoza.

## 2020-08-31 NOTE — Telephone Encounter (Signed)
Fax from [facility] [place] [facility] pharmacy for Unifine pentips 31 G x 3/16  Use to inject insulin once a day  Qty# 100 RF 5 Thanks

## 2020-08-31 NOTE — Assessment & Plan Note (Signed)
Chronic and stable.  Continues to smoke occasional cigarettes.  Albuterol as needed

## 2020-09-01 ENCOUNTER — Other Ambulatory Visit (HOSPITAL_COMMUNITY): Payer: Self-pay | Admitting: Internal Medicine

## 2020-09-01 ENCOUNTER — Other Ambulatory Visit: Payer: Self-pay | Admitting: *Deleted

## 2020-09-01 DIAGNOSIS — E1142 Type 2 diabetes mellitus with diabetic polyneuropathy: Secondary | ICD-10-CM

## 2020-09-01 MED ORDER — UNIFINE PENTIPS 31G X 5 MM MISC: 1.0000 [IU] | Freq: Every day | 5 refills | Status: DC

## 2020-09-01 MED FILL — UNIFINE PENTIPS 31GX3/16: 31G X 5 MM | 90 days supply | Qty: 100 | Fill #0

## 2020-09-02 ENCOUNTER — Other Ambulatory Visit (HOSPITAL_COMMUNITY): Payer: Self-pay | Admitting: Internal Medicine

## 2020-09-02 MED ORDER — ACCU-CHEK GUIDE VI STRP: ORAL_STRIP | 12 refills | Status: DC

## 2020-09-02 MED FILL — ACCU-CHEK GUIDE TEST STRIP: 50 days supply | Qty: 50 | Fill #0

## 2020-09-07 ENCOUNTER — Telehealth: Payer: Self-pay | Admitting: Dietician

## 2020-09-07 NOTE — Telephone Encounter (Signed)
Called to follow up on recent visit and goal of quitting smoking,  and offer a referral to our pharmacy team if desired.

## 2020-09-13 ENCOUNTER — Ambulatory Visit (HOSPITAL_COMMUNITY)
Admission: RE | Admit: 2020-09-13 | Discharge: 2020-09-13 | Disposition: A | Discharge status: Home / Self Care | Payer: Medicare Other | Source: Ambulatory Visit | Attending: Internal Medicine | Admitting: Internal Medicine

## 2020-09-13 ENCOUNTER — Other Ambulatory Visit: Payer: Self-pay

## 2020-09-13 DIAGNOSIS — E119 Type 2 diabetes mellitus without complications: Secondary | ICD-10-CM | POA: Diagnosis not present

## 2020-09-13 DIAGNOSIS — I5042 Chronic combined systolic (congestive) and diastolic (congestive) heart failure: Secondary | ICD-10-CM | POA: Diagnosis not present

## 2020-09-13 DIAGNOSIS — F17210 Nicotine dependence, cigarettes, uncomplicated: Secondary | ICD-10-CM | POA: Diagnosis not present

## 2020-09-13 DIAGNOSIS — I110 Hypertensive heart disease with heart failure: Secondary | ICD-10-CM | POA: Diagnosis not present

## 2020-09-13 LAB — ECHOCARDIOGRAM COMPLETE
Area-P 1/2: 4.8 cm2
Calc EF: 47 %
S' Lateral: 3.9 cm
Single Plane A2C EF: 49.9 %
Single Plane A4C EF: 37.3 %

## 2020-09-13 MED FILL — VICTOZA 18 MG/3 ML INJECT P: 18 | 90 days supply | Qty: 9 | Fill #2

## 2020-09-13 NOTE — Progress Notes (Signed)
  Echocardiogram 2D Echocardiogram has been performed.  Eddie [name] 09/13/2020, 9:33 AM

## 2020-09-17 ENCOUNTER — Other Ambulatory Visit (HOSPITAL_COMMUNITY): Payer: Self-pay | Admitting: Ophthalmology

## 2020-09-17 MED FILL — LATANOPROST 0.005% EYE DRP: 0.005 | 75 days supply | Qty: 8 | Fill #0

## 2020-09-17 MED FILL — ENTRESTO 97 MG-103 MG TAB: 97-103 | 90 days supply | Qty: 180 | Fill #2

## 2020-09-17 MED FILL — AMLODIPINE BESYLATE 5 MG TA: 5 | 30 days supply | Qty: 30 | Fill #2

## 2020-09-17 MED FILL — METFORMIN HCL 1000 MG TABS: 1000 | 90 days supply | Qty: 180 | Fill #3

## 2020-09-17 MED FILL — DORZOLAMIDE-TIMOLOL EYE DRP: 22.3-6.8 | 38 days supply | Qty: 10 | Fill #0

## 2020-09-21 ENCOUNTER — Encounter: Payer: Self-pay | Admitting: Dietician

## 2020-09-21 NOTE — Telephone Encounter (Signed)
Left voicemail for return call

## 2020-09-27 MED FILL — JARDIANCE 10 MG TABLET: 10 | 90 days supply | Qty: 90 | Fill #3

## 2020-10-15 ENCOUNTER — Other Ambulatory Visit (HOSPITAL_COMMUNITY): Payer: Self-pay | Admitting: Internal Medicine

## 2020-10-15 MED FILL — AMLODIPINE BESYLATE 5 MG TA: 5 | 30 days supply | Qty: 30 | Fill #3

## 2020-10-15 MED FILL — CLOPIDOGREL 75 MG TABLET: 75 | 90 days supply | Qty: 90 | Fill #0

## 2020-10-21 ENCOUNTER — Other Ambulatory Visit: Payer: Self-pay | Admitting: *Deleted

## 2020-10-21 DIAGNOSIS — E1140 Type 2 diabetes mellitus with diabetic neuropathy, unspecified: Secondary | ICD-10-CM

## 2020-10-25 ENCOUNTER — Other Ambulatory Visit (HOSPITAL_COMMUNITY): Payer: Self-pay

## 2020-10-25 MED ORDER — ACCU-CHEK FASTCLIX LANCETS MISC
3 refills | Status: DC
  Filled 2020-10-25 – 2020-11-15 (×2): qty 102, 90d supply, fill #0
  Filled 2021-01-13: qty 102, 90d supply, fill #1

## 2020-10-26 ENCOUNTER — Other Ambulatory Visit (HOSPITAL_COMMUNITY): Payer: Self-pay

## 2020-10-27 ENCOUNTER — Other Ambulatory Visit: Payer: Self-pay

## 2020-10-27 ENCOUNTER — Ambulatory Visit (INDEPENDENT_AMBULATORY_CARE_PROVIDER_SITE_OTHER): Payer: Medicare Other | Admitting: Podiatry

## 2020-10-27 DIAGNOSIS — M79675 Pain in left toe(s): Secondary | ICD-10-CM | POA: Diagnosis not present

## 2020-10-27 DIAGNOSIS — B351 Tinea unguium: Secondary | ICD-10-CM | POA: Diagnosis not present

## 2020-10-27 DIAGNOSIS — N1832 Chronic kidney disease, stage 3b: Secondary | ICD-10-CM

## 2020-10-27 DIAGNOSIS — M79674 Pain in right toe(s): Secondary | ICD-10-CM

## 2020-10-27 DIAGNOSIS — E1142 Type 2 diabetes mellitus with diabetic polyneuropathy: Secondary | ICD-10-CM | POA: Diagnosis not present

## 2020-10-31 ENCOUNTER — Encounter: Payer: Self-pay | Admitting: Podiatry

## 2020-10-31 NOTE — Progress Notes (Signed)
  Subjective:  Patient ID: Eddie [name], male    DOB: 06-Jan-1951,  MRN: [id number]  70 y.o. male presents with at risk foot care with history of diabetic neuropathy and painful thick toenails that are difficult to trim. Pain interferes with ambulation. Aggravating factors include wearing enclosed shoe gear. Pain is relieved with periodic professional debridement.  States blood glucose was 122 mg/dl today.   PCP: Gust Rung, DO and last visit was: 08/26/2020.  Review of Systems: Negative except as noted in the HPI.   Allergies  Allergen Reactions  . Bidil [Isosorb Dinitrate-Hydralazine] Other (See Comments)    Patient became lightheaded with 1 tablet tid.    Objective:  There were no vitals filed for this visit.   Constitutional Patient is a pleasant 69 y.o. African American male in NAD.Marland Kitchen AAO x 3.  Vascular Capillary fill time to digits <3 seconds b/l lower extremities. Palpable pedal pulses b/l LE. Pedal hair absent. Lower extremity skin temperature gradient within normal limits. No pain with calf compression b/l. No ischemia or gangrene noted b/l lower extremities. No cyanosis or clubbing noted.  Neurologic Normal speech. Oriented to person, place, and time. Protective sensation decreased with 10 gram monofilament b/l. Vibratory sensation intact b/l.  Dermatologic Pedal skin with normal turgor, texture and tone bilaterally. No open wounds bilaterally. No interdigital macerations bilaterally. Toenails 1-5 b/l elongated, discolored, dystrophic, thickened, crumbly with subungual debris and tenderness to dorsal palpation.  Orthopedic: Normal muscle strength 5/5 to all lower extremity muscle groups bilaterally. No pain crepitus or joint limitation noted with ROM b/l. No gross bony deformities bilaterally.   Hemoglobin A1C Latest Ref Rng & Units 08/26/2020 07/01/2020 01/01/2020  HGBA1C 4.0 - 5.6 % 6.6(A) 6.1(A) 6.4(A)  Some recent data might be hidden   Assessment:   1. Pain due to  onychomycosis of toenails of both feet   2. Stage 3b chronic kidney disease (HCC)   3. Diabetic peripheral neuropathy associated with type 2 diabetes mellitus (HCC)    Plan:  Patient was evaluated and treated and all questions answered.  Onychomycosis with pain -Nails palliatively debridement as below. -Educated on self-care  Procedure: Nail Debridement Rationale: Pain Type of Debridement: manual, sharp debridement. Instrumentation: Nail nipper, rotary burr. Number of Nails: 10  -Examined patient. -Continue diabetic foot care principles. -Toenails 1-5 b/l were debrided in length and girth with sterile nail nippers and dremel without iatrogenic bleeding.  -Patient to report any pedal injuries to medical professional immediately. -Patient to continue soft, supportive shoe gear daily. Start procedure for diabetic shoes. Patient qualifies based on diagnoses. -Patient/POA to call should there be question/concern in the interim.  Return in about 3 months (around 01/26/2021) for diabetic nail trim.  Freddie Breech, DPM

## 2020-11-03 ENCOUNTER — Other Ambulatory Visit (HOSPITAL_COMMUNITY): Payer: Self-pay

## 2020-11-05 ENCOUNTER — Other Ambulatory Visit (HOSPITAL_COMMUNITY): Payer: Self-pay

## 2020-11-14 ENCOUNTER — Other Ambulatory Visit (HOSPITAL_COMMUNITY): Payer: Self-pay | Admitting: Internal Medicine

## 2020-11-14 MED FILL — Carvedilol Tab 25 MG: ORAL | 90 days supply | Qty: 180 | Fill #0 | Status: AC

## 2020-11-14 MED FILL — Atorvastatin Calcium Tab 80 MG (Base Equivalent): ORAL | 90 days supply | Qty: 90 | Fill #0 | Status: AC

## 2020-11-15 ENCOUNTER — Other Ambulatory Visit (HOSPITAL_COMMUNITY): Payer: Self-pay

## 2020-11-15 MED FILL — Glucose Blood Test Strip: 90 days supply | Qty: 100 | Fill #0 | Status: AC

## 2020-11-15 MED FILL — Insulin Pen Needle 31 G X 5 MM (1/5" or 3/16"): 90 days supply | Qty: 100 | Fill #0 | Status: AC

## 2020-11-16 ENCOUNTER — Other Ambulatory Visit (HOSPITAL_COMMUNITY): Payer: Self-pay | Admitting: Internal Medicine

## 2020-11-16 ENCOUNTER — Other Ambulatory Visit (HOSPITAL_COMMUNITY): Payer: Self-pay

## 2020-11-16 MED ORDER — AMLODIPINE BESYLATE 5 MG PO TABS
5.0000 mg | ORAL_TABLET | Freq: Every day | ORAL | 3 refills | Status: DC
Start: 2020-11-16 — End: 2021-02-10
  Filled 2020-11-16: qty 30, 30d supply, fill #0
  Filled 2020-12-11: qty 30, 30d supply, fill #1
  Filled 2021-01-10: qty 30, 30d supply, fill #2

## 2020-11-16 MED FILL — Furosemide Tab 40 MG: ORAL | 30 days supply | Qty: 60 | Fill #0 | Status: AC

## 2020-12-10 MED FILL — Liraglutide Soln Pen-injector 18 MG/3ML (6 MG/ML): SUBCUTANEOUS | 90 days supply | Qty: 9 | Fill #0 | Status: AC

## 2020-12-11 ENCOUNTER — Other Ambulatory Visit (HOSPITAL_COMMUNITY): Payer: Self-pay | Admitting: Internal Medicine

## 2020-12-11 ENCOUNTER — Other Ambulatory Visit (HOSPITAL_COMMUNITY): Payer: Self-pay

## 2020-12-11 ENCOUNTER — Other Ambulatory Visit: Payer: Self-pay | Admitting: Internal Medicine

## 2020-12-11 MED FILL — Ivabradine HCl Tab 5 MG (Base Equiv): ORAL | 90 days supply | Qty: 180 | Fill #0 | Status: AC

## 2020-12-11 MED FILL — Sacubitril-Valsartan Tab 97-103 MG: ORAL | 90 days supply | Qty: 180 | Fill #0 | Status: AC

## 2020-12-13 ENCOUNTER — Other Ambulatory Visit (HOSPITAL_COMMUNITY): Payer: Self-pay

## 2020-12-13 MED ORDER — EMPAGLIFLOZIN 10 MG PO TABS
10.0000 mg | ORAL_TABLET | Freq: Every day | ORAL | 3 refills | Status: DC
  Filled 2020-12-13: qty 90, 90d supply, fill #0
  Filled 2021-03-30: qty 90, 90d supply, fill #1
  Filled 2021-06-26: qty 90, 90d supply, fill #2
  Filled 2021-09-21: qty 90, 90d supply, fill #3

## 2020-12-14 ENCOUNTER — Other Ambulatory Visit (HOSPITAL_COMMUNITY): Payer: Self-pay

## 2020-12-14 MED ORDER — METFORMIN HCL 1000 MG PO TABS
1000.0000 mg | ORAL_TABLET | Freq: Two times a day (BID) | ORAL | 3 refills | Status: DC
  Filled 2020-12-14: qty 180, 90d supply, fill #0
  Filled 2021-03-13: qty 180, 90d supply, fill #1
  Filled 2021-06-16: qty 180, 90d supply, fill #2
  Filled 2021-09-10: qty 180, 90d supply, fill #3

## 2021-01-10 ENCOUNTER — Other Ambulatory Visit (HOSPITAL_COMMUNITY): Payer: Self-pay

## 2021-01-10 MED FILL — Furosemide Tab 40 MG: ORAL | 30 days supply | Qty: 60 | Fill #1 | Status: AC

## 2021-01-10 MED FILL — Clopidogrel Bisulfate Tab 75 MG (Base Equiv): ORAL | 90 days supply | Qty: 90 | Fill #0 | Status: AC

## 2021-01-13 ENCOUNTER — Encounter: Payer: Medicare Other | Admitting: Dietician

## 2021-01-13 ENCOUNTER — Encounter: Payer: Medicare Other | Admitting: Internal Medicine

## 2021-01-13 ENCOUNTER — Ambulatory Visit (INDEPENDENT_AMBULATORY_CARE_PROVIDER_SITE_OTHER): Payer: Medicare Other | Admitting: Dietician

## 2021-01-13 ENCOUNTER — Encounter: Payer: Self-pay | Admitting: Internal Medicine

## 2021-01-13 ENCOUNTER — Telehealth: Payer: Self-pay | Admitting: Dietician

## 2021-01-13 ENCOUNTER — Telehealth: Payer: Self-pay | Admitting: Internal Medicine

## 2021-01-13 ENCOUNTER — Other Ambulatory Visit (HOSPITAL_COMMUNITY): Payer: Self-pay

## 2021-01-13 ENCOUNTER — Other Ambulatory Visit: Payer: Self-pay

## 2021-01-13 ENCOUNTER — Encounter: Payer: Self-pay | Admitting: Dietician

## 2021-01-13 VITALS — Ht 66.0 in | Wt 146.7 lb

## 2021-01-13 DIAGNOSIS — E1140 Type 2 diabetes mellitus with diabetic neuropathy, unspecified: Secondary | ICD-10-CM | POA: Diagnosis not present

## 2021-01-13 LAB — POCT GLYCOSYLATED HEMOGLOBIN (HGB A1C): Hemoglobin A1C: 5.9 % — AB (ref 4.0–5.6)

## 2021-01-13 LAB — GLUCOSE, CAPILLARY: Glucose-Capillary: 133 mg/dL — ABNORMAL HIGH (ref 70–99)

## 2021-01-13 MED ORDER — ACCU-CHEK FASTCLIX LANCET KIT
PACK | 1 refills | Status: DC
  Filled 2021-01-13: qty 1, 1d supply, fill #0

## 2021-01-13 MED FILL — Glucose Blood Test Strip: 90 days supply | Qty: 100 | Fill #1 | Status: CN

## 2021-01-13 NOTE — Patient Instructions (Addendum)
Thank you for your visit today!  We talked about:   Getting a new lancing device- this has been ordered for you.   Sharing with Dr. Maia Plan about the pain in your lower legs when walking  Calling our pharmacist, Cyndi Bender, with questions about the lozenges to help with quitting smoking  Goals to work on:    Quitting smoking Keeping up the exercise, taking your medications and good eating!   Please feel free to call me anytime.  [name] [phone]

## 2021-01-13 NOTE — Progress Notes (Signed)
Diabetes Self-Management Education  Visit Type: 4 Month Follow-Up  Appt. Start Time: 825 Appt. End Time: 855  01/13/2021  [name], identified by name and date of birth, is a 69 y.o. male with a diagnosis of Diabetes:  Marland Kitchen Type 2  ASSESSMENT Weight is at goal. Blood sugars and blood pressure at goal. He is still working on quitting smoking. He says calves hurt when walking and limit his distance. He has to stop and rest. I asked him to be sure Dr. Mikey Bussing is aware of this.    Lab Results  Component Value Date   HGBA1C 5.9 (A) 01/13/2021   HGBA1C 6.6 (A) 08/26/2020   HGBA1C 6.1 (A) 07/01/2020   HGBA1C 6.4 (A) 01/01/2020   HGBA1C 6.7 (A) 07/10/2019    Estimated body mass index is 23.68 kg/m as calculated from the following:   Height as of an earlier encounter on 01/13/21: 5\' 6"  (1.676 m).   Weight as of an earlier encounter on 01/13/21: 146 lb 11.2 oz (66.5 kg). BP Readings from Last 3 Encounters:  08/26/20 117/66  08/25/20 128/85  03/17/20 110/66      Diabetes Self-Management Education - 01/13/21 0900       Visit Information   Visit Type 4 Month Follow-Up      Health Coping   How would you rate your overall health? Good      Complications   Last HgB A1C per patient/outside source 5.9 %    How often do you check your blood sugar? 1-2 times/day    Fasting Blood glucose range (mg/dL) 01/15/21    Number of hypoglycemic episodes per month 0    Number of hyperglycemic episodes per week 0    Have you had a dilated eye exam in the past 12 months? No   making appointment today     Dietary Intake   Breakfast eggs, 86-761 bacon or [place] sausage, coffee, or oatmeal or cold cereal    Lunch salad with meat, beansm bacon bits    Dinner meatloaf, collard greens, rice and cornbread    Beverage(s) lowr sugar Koolaid, water, coffee      Exercise   Exercise Type ADL's;Light (walking / raking leaves)    How many days per week to you exercise? 7    How many minutes per day do you  exercise? 30    Total minutes per week of exercise 210      Patient Education   Previous Diabetes Education Yes (please comment)   here in February 2022   Monitoring Yearly dilated eye exam;Identified appropriate SMBG and/or A1C goals.    Chronic complications Relationship between chronic complications and blood glucose control;Retinopathy and reason for yearly dilated eye exams      Individualized Goals (developed by patient)   Reducing Risk stop smoking      Patient Self-Evaluation of Goals - Patient rates self as meeting previously set goals (% of time)   Reducing Risk 50 - 75 %   is only smoking 2 cigarettes per day, not interested in quitline and patches but has lozenges     Post-Education Assessment   Patient understands prevention, detection, and treatment of acute complications. Demonstrates understanding / competency    Patient understands prevention, detection, and treatment of chronic complications. Demonstrates understanding / competency      Outcomes   Expected Outcomes Demonstrated interest in learning. Expect positive outcomes    Future DMSE 6 months    Program Status Completed  Subsequent Visit   Since your last visit have you continued or begun to take your medications as prescribed? Yes    Since your last visit have you had your blood pressure checked? Yes    Is your most recent blood pressure lower, unchanged, or higher since your last visit? Unchanged    Since your last visit have you experienced any weight changes? Loss    Weight Loss (lbs) 13   says he ats lighter in the warmer months   Since your last visit, are you checking your blood glucose at least once a day? Yes             Individualized Plan for Diabetes Self-Management Training:   Learning Objective:  Patient will have a greater understanding of diabetes self-management. Patient education plan is to attend individual and/or group sessions per assessed needs and concerns.   Plan:   There  are no Patient Instructions on file for this visit.  Expected Outcomes:  Demonstrated interest in learning. Expect positive outcomes  Education material provided: Diabetes Resources  If problems or questions, patient to contact team via:  Phone  Future DSME appointment: 6 months

## 2021-01-13 NOTE — Telephone Encounter (Signed)
This patient is in Need of an Diabetic Eye Exam and has not had one in about 3 years.  Please advise if you can place a referral for this patient.

## 2021-01-13 NOTE — Telephone Encounter (Signed)
His lancing device is broken. He needs a new one.

## 2021-01-13 NOTE — Telephone Encounter (Signed)
This encounter was created in error - please disregard.

## 2021-01-14 ENCOUNTER — Other Ambulatory Visit (HOSPITAL_COMMUNITY): Payer: Self-pay

## 2021-01-28 NOTE — Progress Notes (Signed)
Seen by other physician This encounter was created in error - please disregard.

## 2021-02-01 ENCOUNTER — Other Ambulatory Visit: Payer: Self-pay

## 2021-02-01 ENCOUNTER — Ambulatory Visit (INDEPENDENT_AMBULATORY_CARE_PROVIDER_SITE_OTHER): Payer: Medicare Other | Admitting: Podiatry

## 2021-02-01 ENCOUNTER — Encounter: Payer: Self-pay | Admitting: Podiatry

## 2021-02-01 DIAGNOSIS — E1142 Type 2 diabetes mellitus with diabetic polyneuropathy: Secondary | ICD-10-CM

## 2021-02-01 DIAGNOSIS — B351 Tinea unguium: Secondary | ICD-10-CM

## 2021-02-01 DIAGNOSIS — M79675 Pain in left toe(s): Secondary | ICD-10-CM

## 2021-02-01 DIAGNOSIS — M79674 Pain in right toe(s): Secondary | ICD-10-CM | POA: Diagnosis not present

## 2021-02-03 NOTE — Progress Notes (Signed)
Subjective: Eddie [name] is a pleasant 70 y.o. male patient seen today painful thick toenails that are difficult to trim. Pain interferes with ambulation. Aggravating factors include wearing enclosed shoe gear. Pain is relieved with periodic professional debridement.  PCP is Gust Rung, DO. Last visit was: 08/26/2020.  His blood glucose was 123 mg/dl this morning.  Allergies  Allergen Reactions   Bidil [Isosorb Dinitrate-Hydralazine] Other (See Comments)    Patient became lightheaded with 1 tablet tid.     Objective: Physical Exam  General: Eddie [name] is a pleasant 70 y.o. African American male, in NAD. AAO x 3.   Vascular:  Capillary fill time to digits <3 seconds b/l lower extremities. Palpable pedal pulses b/l LE. Pedal hair absent. Lower extremity skin temperature gradient within normal limits. No pain with calf compression b/l.  Dermatological:  Pedal skin with normal turgor, texture and tone b/l lower extremities No open wounds b/l lower extremities No interdigital macerations b/l lower extremities Toenails 1-5 b/l elongated, discolored, dystrophic, thickened, crumbly with subungual debris and tenderness to dorsal palpation.  Musculoskeletal:  Normal muscle strength 5/5 to all lower extremity muscle groups bilaterally. No pain crepitus or joint limitation noted with ROM b/l. No gross bony deformities bilaterally.  Neurological:  Protective sensation decreased with 10 gram monofilament b/l. Vibratory sensation intact b/l.  Assessment and Plan:  1. Pain due to onychomycosis of toenails of both feet   2. Diabetic peripheral neuropathy associated with type 2 diabetes mellitus (HCC)     -Examined patient. -Patient to continue soft, supportive shoe gear daily. -Toenails 1-5 b/l were debrided in length and girth with sterile nail nippers and dremel without iatrogenic bleeding.  -Patient to report any pedal injuries to medical professional immediately. -Patient/POA to  call should there be question/concern in the interim.  Return in about 3 months (around 05/04/2021).  Freddie Breech, DPM

## 2021-02-10 ENCOUNTER — Ambulatory Visit (INDEPENDENT_AMBULATORY_CARE_PROVIDER_SITE_OTHER): Payer: Medicare Other | Admitting: Internal Medicine

## 2021-02-10 ENCOUNTER — Encounter: Payer: Self-pay | Admitting: Internal Medicine

## 2021-02-10 ENCOUNTER — Other Ambulatory Visit: Payer: Self-pay

## 2021-02-10 ENCOUNTER — Encounter: Payer: Medicare Other | Admitting: Dietician

## 2021-02-10 ENCOUNTER — Other Ambulatory Visit (HOSPITAL_COMMUNITY): Payer: Self-pay

## 2021-02-10 VITALS — BP 94/55 | HR 72 | Temp 98.2°F | Ht 66.0 in | Wt 143.1 lb

## 2021-02-10 DIAGNOSIS — J439 Emphysema, unspecified: Secondary | ICD-10-CM

## 2021-02-10 DIAGNOSIS — I5042 Chronic combined systolic (congestive) and diastolic (congestive) heart failure: Secondary | ICD-10-CM

## 2021-02-10 DIAGNOSIS — G2581 Restless legs syndrome: Secondary | ICD-10-CM | POA: Insufficient documentation

## 2021-02-10 DIAGNOSIS — D649 Anemia, unspecified: Secondary | ICD-10-CM | POA: Insufficient documentation

## 2021-02-10 DIAGNOSIS — I2510 Atherosclerotic heart disease of native coronary artery without angina pectoris: Secondary | ICD-10-CM

## 2021-02-10 DIAGNOSIS — E1140 Type 2 diabetes mellitus with diabetic neuropathy, unspecified: Secondary | ICD-10-CM

## 2021-02-10 DIAGNOSIS — I10 Essential (primary) hypertension: Secondary | ICD-10-CM | POA: Diagnosis not present

## 2021-02-10 DIAGNOSIS — Z720 Tobacco use: Secondary | ICD-10-CM

## 2021-02-10 MED ORDER — ATORVASTATIN CALCIUM 80 MG PO TABS
80.0000 mg | ORAL_TABLET | Freq: Every day | ORAL | 3 refills | Status: DC
  Filled 2021-02-10: qty 90, 90d supply, fill #0
  Filled 2021-05-16: qty 90, 90d supply, fill #1
  Filled 2021-08-14: qty 90, 90d supply, fill #2
  Filled 2021-11-19: qty 90, 90d supply, fill #3

## 2021-02-10 MED ORDER — AMLODIPINE BESYLATE 5 MG PO TABS
5.0000 mg | ORAL_TABLET | Freq: Every day | ORAL | 3 refills | Status: DC
  Filled 2021-02-10: qty 30, 30d supply, fill #0
  Filled 2021-03-14: qty 30, 30d supply, fill #1
  Filled 2021-04-17: qty 30, 30d supply, fill #2
  Filled 2021-05-16: qty 30, 30d supply, fill #3

## 2021-02-10 MED ORDER — UNIFINE PENTIPS 31G X 5 MM MISC
1.0000 [IU] | Freq: Every day | 5 refills | Status: DC
  Filled 2021-02-10: qty 100, 90d supply, fill #0
  Filled 2021-05-17: qty 100, 90d supply, fill #1
  Filled 2021-09-10: qty 100, 90d supply, fill #2
  Filled 2021-11-01 – 2021-11-19 (×2): qty 100, 90d supply, fill #3

## 2021-02-10 MED ORDER — CARVEDILOL 25 MG PO TABS
25.0000 mg | ORAL_TABLET | Freq: Two times a day (BID) | ORAL | 3 refills | Status: DC
  Filled 2021-02-10: qty 180, 90d supply, fill #0
  Filled 2021-05-16: qty 180, 90d supply, fill #1
  Filled 2021-08-14: qty 180, 90d supply, fill #2
  Filled 2021-11-19: qty 180, 90d supply, fill #3

## 2021-02-10 MED ORDER — ACCU-CHEK GUIDE VI STRP
ORAL_STRIP | Freq: Every day | 12 refills | Status: DC
  Filled 2021-02-10: qty 50, 50d supply, fill #0

## 2021-02-10 MED ORDER — ACCU-CHEK FASTCLIX LANCETS MISC
3 refills | Status: DC
  Filled 2021-02-10: qty 102, 90d supply, fill #0
  Filled 2021-05-02: qty 102, 90d supply, fill #1
  Filled 2021-06-18 – 2021-09-10 (×2): qty 102, 90d supply, fill #2

## 2021-02-10 MED ORDER — ALBUTEROL SULFATE HFA 108 (90 BASE) MCG/ACT IN AERS
2.0000 | INHALATION_SPRAY | Freq: Four times a day (QID) | RESPIRATORY_TRACT | 2 refills | Status: AC | PRN
  Filled 2021-02-10: qty 8.5, 25d supply, fill #0

## 2021-02-10 NOTE — Assessment & Plan Note (Signed)
Patient has a history of normocytic anemia on chart review.  Has never had a colonoscopy but did have a negative immunochemical FOBT seven months ago.  Checking iron studies, folate, and B12.  Repeat CBC.

## 2021-02-10 NOTE — Assessment & Plan Note (Signed)
Refilled albuterol PRN.  Continues to smoke.  Advised cessation.

## 2021-02-10 NOTE — Patient Instructions (Signed)
Eddie [name],  It was a pleasure to see you today. Please continue to take all of your medications as previously prescribed. I will contact you with the results of your blood work. Please make an appointment to follow up with Dr. Mikey Bussing in 3 months.   If you have any questions or concerns, call our clinic at [phone] or after hours call [phone] and ask for the internal medicine resident on call.   Thank you!  Dr. Antony Contras

## 2021-02-10 NOTE — Assessment & Plan Note (Signed)
Patient is complaining of cramping leg pain that occurs only at night and often interferes with his sleep.  The pain is worse at night when he lays down and feels the need to get up frequently reposition his legs for pain relief.  He denies daytime symptoms.  Says he can walk up to 2 miles without leg pain.  Symptoms sound consistent with restless leg syndrome.  Checking iron studies today.  Pending results, could consider ropinirole at PCP follow-up.

## 2021-02-10 NOTE — Assessment & Plan Note (Signed)
He is still attempting to quit.  Brought with him over-the-counter 4 mg nicotine lozenges, inquiring if this was safe to take with his medications.  Advised patient it was safe to start but not to smoke while using nicotine replacement therapy. Consider decreasing to 2 mg at next visit if doing well.

## 2021-02-10 NOTE — Assessment & Plan Note (Signed)
Blood pressure was low today 89/46 but improved to 94/55 on recheck.  He is asymptomatic.  Currently taking Entresto, Coreg, and amlodipine.  He also takes Lasix 40 mg BID for his chronic combined systolic and diastolic heart failure.  Appears euvolemic today.  Rechecking BMP due to complaint of leg cramps.  Potassium previously within normal limits.  Continue current regimen.  Sent refills of amlodipine and Coreg to his pharmacy.

## 2021-02-10 NOTE — Assessment & Plan Note (Signed)
Last hemoglobin A1c 1 month ago was 5.9.  Has his glucometer with him today.  Download shows an average CBG of 119 with a range of 94 - 141.  No episodes of hypoglycemia.  100% of readings within range.  Continue Jardiance and Victoza. Sent refills for his pen needles, test strips, and lancets.

## 2021-02-10 NOTE — Assessment & Plan Note (Signed)
History of CAD s/p DES to proximal LAD in 09/2015.  Denies chest pain. Currently on ASA, plavix, and atorvastatin.  Sent 1 year refill of atorvastatin to his pharmacy.

## 2021-02-10 NOTE — Progress Notes (Signed)
Subjective:   Patient ID: Eddie [name] male   DOB: 29-Jan-1951 70 y.o.   MRN: [id number]  HPI: [name] is a 70 y.o. male with past medical history outlined below here for follow up of his chronic medical conditions. For the details of today's visit, please refer to the assessment and plan.   Past Medical History:  Diagnosis Date   Blindness of left eye    CAD (coronary artery disease)    a. STEMI 09/2015 w/ DES to Prox LAD   CVA (cerebral infarction)    a. 09/2015: acute small right frontal lobe infarct   Diabetes ([place])    Hypertension    Ischemic cardiomyopathy    OSA (obstructive sleep apnea) 07/06/2017   Mild OSA with AHI of 8.2/hr with hypoxemia as low as 76% now on CPAP at 11cm H2O.   PUD (peptic ulcer disease)    ST elevation myocardial infarction involving left anterior descending (LAD) coronary artery ([place]) 09/28/2015   Current Outpatient Medications  Medication Sig Dispense Refill   Accu-Chek FastClix Lancets MISC Use to check blood sugar 1 time a day 102 each 3   Acetaminophen 325 MG CAPS Take 2 tablets by mouth every 4 (four) hours as needed.     albuterol (VENTOLIN HFA) 108 (90 Base) MCG/ACT inhaler Inhale 2 puffs into the lungs every 6 (six) hours as needed for wheezing or shortness of breath. 8.5 g 2   amLODipine (NORVASC) 5 MG tablet Take 1 tablet (5 mg total) by mouth daily. 30 tablet 3   aspirin EC 81 MG tablet Take 1 tablet (81 mg total) by mouth daily. 90 tablet 3   atorvastatin (LIPITOR) 80 MG tablet Take 1 tablet (80 mg total) by mouth daily. 90 tablet 3   bimatoprost (LUMIGAN) 0.03 % ophthalmic solution Place 1 drop into the right eye at bedtime.     Blood Glucose Monitoring Suppl (ACCU-CHEK GUIDE) w/Device KIT 1 kit by Does not apply route daily. 1 kit 0   brimonidine (ALPHAGAN) 0.2 % ophthalmic solution Apply to eye.     carvedilol (COREG) 25 MG tablet Take 1 tablet (25 mg total) by mouth 2 (two) times daily. 180 tablet 3   clopidogrel (PLAVIX) 75 MG  tablet TAKE 1 TABLET (75 MG TOTAL) BY MOUTH DAILY. 90 tablet 3   dorzolamide-timolol (COSOPT) 22.3-6.8 MG/ML ophthalmic solution 1 drop 2 (two) times daily.     dorzolamide-timolol (COSOPT) 22.3-6.8 MG/ML ophthalmic solution INSTILL 1 DROP INTO BOTH EYES TWICE DAILY. 15 mL 12   empagliflozin (JARDIANCE) 10 MG TABS tablet Take 1 tablet (10 mg total) by mouth daily before breakfast. 90 tablet 3   ENTRESTO 97-103 MG TAKE 1 TABLET BY MOUTH 2 TIMES DAILY. 180 tablet 3   eplerenone (INSPRA) 25 MG tablet Take 1 tablet (25 mg total) by mouth daily. 90 tablet 3   furosemide (LASIX) 40 MG tablet TAKE 1 TABLET (40 MG TOTAL) BY MOUTH 2 (TWO) TIMES DAILY. 60 tablet 3   glucose blood (ACCU-CHEK GUIDE) test strip Use to check blood sugar once daily 100 each 12   glucose blood (ACCU-CHEK GUIDE) test strip Use as directed daily 100 each 12   Insulin Pen Needle (UNIFINE PENTIPS) 31G X 5 MM MISC Use daily as directed 100 each 5   Insulin Pen Needle 31G X 5 MM MISC USE AS DIRECTED TO INJECT INSULIN DAILY 100 each 5   ivabradine (CORLANOR) 5 MG TABS tablet Take 1 tablet (5 mg total) by mouth  2 (two) times daily with a meal. 180 tablet 5   ivabradine (CORLANOR) 5 MG TABS tablet TAKE 1 TABLET (5 MG TOTAL) BY MOUTH 2 (TWO) TIMES DAILY WITH A MEAL. 180 tablet 5   Lancets Misc. (ACCU-CHEK FASTCLIX LANCET) KIT Use as directed once daily 1 kit 1   latanoprost (XALATAN) 0.005 % ophthalmic solution SMARTSIG:1 Drop(s) In Eye(s) Every Evening     latanoprost (XALATAN) 0.005 % ophthalmic solution INSTILL 1 DROP INTO BOTH EYES EVERY EVENING. 7.5 mL 12   liraglutide (VICTOZA) 18 MG/3ML SOPN Inject 0.1 mLs (0.6 mg total) into the skin daily. 9 mL 3   liraglutide (VICTOZA) 18 MG/3ML SOPN INJECT 0.1 MLS (0.6 MG TOTAL) INTO THE SKIN DAILY. 9 mL 3   metFORMIN (GLUCOPHAGE) 1000 MG tablet Take 1 tablet (1,000 mg total) by mouth 2 (two) times daily with a meal. 180 tablet 3   nitroGLYCERIN (NITROSTAT) 0.4 MG SL tablet Use one pill every 5  minutes X 3 if needed for chest pain. Contact MD if pain does not improve 30 tablet 0   sacubitril-valsartan (ENTRESTO) 97-103 MG TAKE 1 TABLET BY MOUTH 2 TIMES DAILY. 180 tablet 3   No current facility-administered medications for this visit.   Family History  Problem Relation Age of Onset   Diabetes Mother    Cancer Neg Hx    Social History   Socioeconomic History   Marital status: Married    Spouse name: Not on file   Number of children: Not on file   Years of education: Not on file   Highest education level: Not on file  Occupational History   Occupation: Electrical engineer: [employer]  Tobacco Use   Smoking status: Some Days    Packs/day: 0.20    Types: Cigarettes   Smokeless tobacco: Never  Substance and Sexual Activity   Alcohol use: No    Alcohol/week: 0.0 standard drinks   Drug use: No   Sexual activity: Not on file  Other Topics Concern   Not on file  Social History Narrative   Not on file   Social Determinants of Health   Financial Resource Strain: Not on file  Food Insecurity: Not on file  Transportation Needs: Not on file  Physical Activity: Not on file  Stress: Not on file  Social Connections: Not on file    Review of Systems: Review of Systems  Respiratory:  Negative for shortness of breath.   Cardiovascular:  Negative for chest pain.  Musculoskeletal:        Nocturnal leg cramps    Objective:  Physical Exam:  Vitals:   02/10/21 0930 02/10/21 1007  BP: (!) 89/46 (!) 94/55  Pulse: 74 72  Temp: 98.2 F (36.8 C)   TempSrc: Oral   SpO2: 100%   Weight: 143 lb 1.6 oz (64.9 kg)   Height: 5' 6" (1.676 m)     Physical Exam Vitals reviewed.  Constitutional:      Appearance: Normal appearance.  Cardiovascular:     Rate and Rhythm: Normal rate and regular rhythm.  Pulmonary:     Effort: Pulmonary effort is normal.     Breath sounds: Normal breath sounds.  Musculoskeletal:     Right lower leg: No edema.     Left lower leg:  No edema.  Skin:    General: Skin is warm and dry.  Neurological:     Mental Status: He is alert.  Psychiatric:  Mood and Affect: Mood normal.        Behavior: Behavior normal.     Assessment & Plan:   See Encounters Tab for problem based charting.

## 2021-02-11 LAB — BMP8+ANION GAP
Anion Gap: 16 mmol/L (ref 10.0–18.0)
BUN/Creatinine Ratio: 12 (ref 10–24)
BUN: 29 mg/dL — ABNORMAL HIGH (ref 8–27)
CO2: 25 mmol/L (ref 20–29)
Calcium: 9.5 mg/dL (ref 8.6–10.2)
Chloride: 101 mmol/L (ref 96–106)
Creatinine, Ser: 2.34 mg/dL — ABNORMAL HIGH (ref 0.76–1.27)
Glucose: 115 mg/dL — ABNORMAL HIGH (ref 65–99)
Potassium: 4.6 mmol/L (ref 3.5–5.2)
Sodium: 142 mmol/L (ref 134–144)
eGFR: 29 mL/min/{1.73_m2} — ABNORMAL LOW (ref >59)

## 2021-02-11 LAB — CBC
Hematocrit: 31.9 % — ABNORMAL LOW (ref 37.5–51.0)
Hemoglobin: 9.4 g/dL — ABNORMAL LOW (ref 13.0–17.7)
MCH: 24.4 pg — ABNORMAL LOW (ref 26.6–33.0)
MCHC: 29.5 g/dL — ABNORMAL LOW (ref 31.5–35.7)
MCV: 83 fL (ref 79–97)
Platelets: 273 10*3/uL (ref 150–450)
RBC: 3.85 x10E6/uL — ABNORMAL LOW (ref 4.14–5.80)
RDW: 12.6 % (ref 11.6–15.4)
WBC: 5.9 10*3/uL (ref 3.4–10.8)

## 2021-02-11 LAB — FOLATE RBC
Folate, Hemolysate: 419 ng/mL
Folate, RBC: 1313 ng/mL (ref >498)

## 2021-02-11 LAB — FERRITIN: Ferritin: 70 ng/mL (ref 30–400)

## 2021-02-11 LAB — IRON AND TIBC
Iron Saturation: 23 % (ref 15–55)
Iron: 59 ug/dL (ref 38–169)
Total Iron Binding Capacity: 256 ug/dL (ref 250–450)
UIBC: 197 ug/dL (ref 111–343)

## 2021-02-11 LAB — VITAMIN B12: Vitamin B-12: 288 pg/mL (ref 232–1245)

## 2021-02-11 NOTE — Addendum Note (Signed)
Addended by: Burnell Blanks on: 02/11/2021 11:49 AM   Modules accepted: Orders

## 2021-02-14 LAB — SPECIMEN STATUS REPORT

## 2021-02-14 LAB — METHYLMALONIC ACID, SERUM: Methylmalonic Acid: 575 nmol/L — ABNORMAL HIGH (ref 0–378)

## 2021-02-17 ENCOUNTER — Other Ambulatory Visit (HOSPITAL_COMMUNITY): Payer: Self-pay

## 2021-02-17 MED ORDER — VITAMIN B-12 1000 MCG PO TABS
1000.0000 ug | ORAL_TABLET | Freq: Every day | ORAL | 1 refills | Status: AC
  Filled 2021-02-17: qty 90, 90d supply, fill #0

## 2021-02-17 NOTE — Addendum Note (Signed)
Addended by: Gust Rung on: 02/17/2021 04:38 PM   Modules accepted: Orders

## 2021-02-22 ENCOUNTER — Ambulatory Visit (HOSPITAL_COMMUNITY)
Admission: RE | Admit: 2021-02-22 | Discharge: 2021-02-22 | Disposition: A | Discharge status: Home / Self Care | Payer: Medicare Other | Source: Ambulatory Visit | Attending: Internal Medicine | Admitting: Internal Medicine

## 2021-02-22 ENCOUNTER — Encounter (HOSPITAL_COMMUNITY): Payer: Self-pay | Admitting: Internal Medicine

## 2021-02-22 ENCOUNTER — Other Ambulatory Visit: Payer: Self-pay

## 2021-02-22 ENCOUNTER — Other Ambulatory Visit (HOSPITAL_COMMUNITY): Payer: Self-pay

## 2021-02-22 VITALS — BP 100/52 | HR 65 | Wt 149.6 lb

## 2021-02-22 DIAGNOSIS — Z79899 Other long term (current) drug therapy: Secondary | ICD-10-CM | POA: Diagnosis not present

## 2021-02-22 DIAGNOSIS — Z7982 Long term (current) use of aspirin: Secondary | ICD-10-CM | POA: Diagnosis not present

## 2021-02-22 DIAGNOSIS — Z7902 Long term (current) use of antithrombotics/antiplatelets: Secondary | ICD-10-CM | POA: Diagnosis not present

## 2021-02-22 DIAGNOSIS — F17210 Nicotine dependence, cigarettes, uncomplicated: Secondary | ICD-10-CM | POA: Diagnosis not present

## 2021-02-22 DIAGNOSIS — I255 Ischemic cardiomyopathy: Secondary | ICD-10-CM | POA: Diagnosis not present

## 2021-02-22 DIAGNOSIS — I130 Hypertensive heart and chronic kidney disease with heart failure and stage 1 through stage 4 chronic kidney disease, or unspecified chronic kidney disease: Secondary | ICD-10-CM | POA: Diagnosis not present

## 2021-02-22 DIAGNOSIS — I2510 Atherosclerotic heart disease of native coronary artery without angina pectoris: Secondary | ICD-10-CM | POA: Diagnosis not present

## 2021-02-22 DIAGNOSIS — N1832 Chronic kidney disease, stage 3b: Secondary | ICD-10-CM | POA: Insufficient documentation

## 2021-02-22 DIAGNOSIS — Z794 Long term (current) use of insulin: Secondary | ICD-10-CM | POA: Insufficient documentation

## 2021-02-22 DIAGNOSIS — Z888 Allergy status to other drugs, medicaments and biological substances status: Secondary | ICD-10-CM | POA: Diagnosis not present

## 2021-02-22 DIAGNOSIS — Z720 Tobacco use: Secondary | ICD-10-CM | POA: Diagnosis not present

## 2021-02-22 DIAGNOSIS — I5022 Chronic systolic (congestive) heart failure: Secondary | ICD-10-CM | POA: Diagnosis present

## 2021-02-22 DIAGNOSIS — Z833 Family history of diabetes mellitus: Secondary | ICD-10-CM | POA: Diagnosis not present

## 2021-02-22 DIAGNOSIS — I5042 Chronic combined systolic (congestive) and diastolic (congestive) heart failure: Secondary | ICD-10-CM | POA: Diagnosis not present

## 2021-02-22 DIAGNOSIS — I252 Old myocardial infarction: Secondary | ICD-10-CM | POA: Diagnosis not present

## 2021-02-22 DIAGNOSIS — E1122 Type 2 diabetes mellitus with diabetic chronic kidney disease: Secondary | ICD-10-CM | POA: Diagnosis not present

## 2021-02-22 DIAGNOSIS — Z955 Presence of coronary angioplasty implant and graft: Secondary | ICD-10-CM | POA: Insufficient documentation

## 2021-02-22 DIAGNOSIS — G4733 Obstructive sleep apnea (adult) (pediatric): Secondary | ICD-10-CM | POA: Insufficient documentation

## 2021-02-22 DIAGNOSIS — Z8673 Personal history of transient ischemic attack (TIA), and cerebral infarction without residual deficits: Secondary | ICD-10-CM | POA: Diagnosis not present

## 2021-02-22 LAB — BASIC METABOLIC PANEL
Anion gap: 9 (ref 5–15)
BUN: 29 mg/dL — ABNORMAL HIGH (ref 8–23)
CO2: 26 mmol/L (ref 22–32)
Calcium: 9 mg/dL (ref 8.9–10.3)
Chloride: 104 mmol/L (ref 98–111)
Creatinine, Ser: 1.91 mg/dL — ABNORMAL HIGH (ref 0.61–1.24)
GFR, Estimated: 37 mL/min — ABNORMAL LOW (ref >60)
Glucose, Bld: 131 mg/dL — ABNORMAL HIGH (ref 70–99)
Potassium: 4.6 mmol/L (ref 3.5–5.1)
Sodium: 139 mmol/L (ref 135–145)

## 2021-02-22 MED ORDER — FUROSEMIDE 40 MG PO TABS
40.0000 mg | ORAL_TABLET | Freq: Every day | ORAL | 3 refills | Status: DC
  Filled 2021-02-22 – 2021-03-13 (×2): qty 60, 60d supply, fill #0
  Filled 2021-05-02: qty 60, 60d supply, fill #1

## 2021-02-22 NOTE — Patient Instructions (Addendum)
DECREASED Lasix to 40mg  (1 tablet) daily  Lab work was performed today, any abnormal results the clinic will call you.  Your physician recommends that you return for lab work in: 2 weeks August 16th, 2022 at 8:30 am  Your physician recommends that you schedule a follow-up appointment in: 2 months October 3rd, 2022 at 8:30 am  milAt the [facility], you and your health needs are our priority. As part of our continuing mission to provide you with exceptional heart care, we have created designated Provider Care Teams. These Care Teams include your primary Cardiologist (physician) and Advanced Practice Providers (APPs- Physician Assistants and Nurse Practitioners) who all work together to provide you with the care you need, when you need it.   You may see any of the following providers on your designated Care Team at your next follow up: Dr October 5th, 2022 Dr Arvilla Meres Dr Marca Ancona, NP Brandon Melnick, Robbie Lis [place] Mikki Santee, PharmD   Please be sure to bring in all your medications bottles to every appointment.    If you have any questions or concerns before your next appointment please send Karle Plumber a message through [place] or call our office at [phone].    TO LEAVE A MESSAGE FOR THE NURSE SELECT OPTION 2, PLEASE LEAVE A MESSAGE INCLUDING: YOUR NAME DATE OF BIRTH CALL BACK NUMBER REASON FOR CALL**this is important as we prioritize the call backs  YOU WILL RECEIVE A CALL BACK THE SAME DAY AS LONG AS YOU CALL BEFORE 4:00 PM

## 2021-02-22 NOTE — Progress Notes (Signed)
ReDS Vest / Clip - 02/22/21 1200       ReDS Vest / Clip   Station Marker C    Ruler Value 24    ReDS Value Range Low volume    ReDS Actual Value 29

## 2021-02-22 NOTE — Progress Notes (Signed)
[facility] Note   PCP: Joni Reining HF MD: Dr Haroldine Laws  HPI: Eddie [name] is a 70 y.o. male  with h/o DM2, CAD s/p NSTEMI with LAD stent in 3/17, CVA, chronic systolic HF with previous EF 15-20%.   Had multiple admissions in May and June 2017 in the setting of decompensated CHF and EColi bacteremia. Readmitted in June 2017 with acute respiratory failure requiring intubation in setting of severe HTN and ADHF. Treated with milrinone which was eventually weaned off.     EF has increased about 10% per year, doing well.    Today he returns for HF follow up with his wife.  Feels good. Still smoking a few cigs per day. Using CPAP every night. Mild exertional SOB. Rare fleeting CP not occurring with exertion feels more musculoskeletal says he has had continued problems after CPR in 2016. No edema, orthopnea or PND. No problems with medications.  Not checking bp at home.  Weight has been around 148-152. Cr up a bit to 2.34 12 days ago.    ECHO 08/2020 EF 50-55% Echo 05/2019 EF 40-45%  Echo 12/19 EF 30-35%  Echo 01/11/17: EF 40%.   RV ok.  ECHO 06/19/2016: EF 20-25%.Cannot exclude small  laminar apical thrombus. No mobile or protuberant thrombus is  seen. Echo 5/15 EF 15-20%  Review of systems complete and found to be negative unless listed in HPI.    Past Medical History:  Diagnosis Date   Blindness of left eye    CAD (coronary artery disease)    a. STEMI 09/2015 w/ DES to Prox LAD   CVA (cerebral infarction)    a. 09/2015: acute small right frontal lobe infarct   Diabetes ([place])    Hypertension    Ischemic cardiomyopathy    OSA (obstructive sleep apnea) 07/06/2017   Mild OSA with AHI of 8.2/hr with hypoxemia as low as 76% now on CPAP at 11cm H2O.   PUD (peptic ulcer disease)    ST elevation myocardial infarction involving left anterior descending (LAD) coronary artery ([place]) 09/28/2015    Current Outpatient Medications  Medication Sig Dispense Refill    Accu-Chek FastClix Lancets MISC Use to check blood sugar 1 time a day 102 each 3   Acetaminophen 325 MG CAPS Take 2 tablets by mouth every 4 (four) hours as needed.     albuterol (VENTOLIN HFA) 108 (90 Base) MCG/ACT inhaler Inhale 2 puffs into the lungs every 6 (six) hours as needed for wheezing or shortness of breath. 8.5 g 2   amLODipine (NORVASC) 5 MG tablet Take 1 tablet (5 mg total) by mouth daily. 30 tablet 3   aspirin EC 81 MG tablet Take 1 tablet (81 mg total) by mouth daily. 90 tablet 3   atorvastatin (LIPITOR) 80 MG tablet Take 1 tablet (80 mg total) by mouth daily. 90 tablet 3   bimatoprost (LUMIGAN) 0.03 % ophthalmic solution Place 1 drop into the right eye at bedtime.     Blood Glucose Monitoring Suppl (ACCU-CHEK GUIDE) w/Device KIT 1 kit by Does not apply route daily. 1 kit 0   brimonidine (ALPHAGAN) 0.2 % ophthalmic solution Apply to eye.     carvedilol (COREG) 25 MG tablet Take 1 tablet (25 mg total) by mouth 2 (two) times daily. 180 tablet 3   clopidogrel (PLAVIX) 75 MG tablet TAKE 1 TABLET (75 MG TOTAL) BY MOUTH DAILY. 90 tablet 3   dorzolamide-timolol (COSOPT) 22.3-6.8 MG/ML ophthalmic solution INSTILL 1 DROP INTO  BOTH EYES TWICE DAILY. 15 mL 12   empagliflozin (JARDIANCE) 10 MG TABS tablet Take 1 tablet (10 mg total) by mouth daily before breakfast. 90 tablet 3   ENTRESTO 97-103 MG TAKE 1 TABLET BY MOUTH 2 TIMES DAILY. 180 tablet 3   eplerenone (INSPRA) 25 MG tablet Take 1 tablet (25 mg total) by mouth daily. 90 tablet 3   furosemide (LASIX) 40 MG tablet TAKE 1 TABLET (40 MG TOTAL) BY MOUTH 2 (TWO) TIMES DAILY. 60 tablet 3   glucose blood (ACCU-CHEK GUIDE) test strip Use to check blood sugar once daily 100 each 12   Insulin Pen Needle (UNIFINE PENTIPS) 31G X 5 MM MISC Use daily as directed 100 each 5   Insulin Pen Needle 31G X 5 MM MISC USE AS DIRECTED TO INJECT INSULIN DAILY 100 each 5   ivabradine (CORLANOR) 5 MG TABS tablet TAKE 1 TABLET (5 MG TOTAL) BY MOUTH 2 (TWO) TIMES  DAILY WITH A MEAL. 180 tablet 5   Lancets Misc. (ACCU-CHEK FASTCLIX LANCET) KIT Use as directed once daily 1 kit 1   latanoprost (XALATAN) 0.005 % ophthalmic solution SMARTSIG:1 Drop(s) In Eye(s) Every Evening     latanoprost (XALATAN) 0.005 % ophthalmic solution INSTILL 1 DROP INTO BOTH EYES EVERY EVENING. 7.5 mL 12   liraglutide (VICTOZA) 18 MG/3ML SOPN INJECT 0.1 MLS (0.6 MG TOTAL) INTO THE SKIN DAILY. 9 mL 3   metFORMIN (GLUCOPHAGE) 1000 MG tablet Take 1 tablet (1,000 mg total) by mouth 2 (two) times daily with a meal. 180 tablet 3   nitroGLYCERIN (NITROSTAT) 0.4 MG SL tablet Use one pill every 5 minutes X 3 if needed for chest pain. Contact MD if pain does not improve 30 tablet 0   vitamin B-12 (CYANOCOBALAMIN) 1000 MCG tablet Take 1 tablet (1,000 mcg total) by mouth daily. 90 tablet 1   No current facility-administered medications for this encounter.    Allergies  Allergen Reactions   Bidil [Isosorb Dinitrate-Hydralazine] Other (See Comments)    Patient became lightheaded with 1 tablet tid.       Social History   Socioeconomic History   Marital status: Married    Spouse name: Not on file   Number of children: Not on file   Years of education: Not on file   Highest education level: Not on file  Occupational History   Occupation: Electrical engineer: [employer]  Tobacco Use   Smoking status: Some Days    Packs/day: 0.20    Types: Cigarettes   Smokeless tobacco: Never  Substance and Sexual Activity   Alcohol use: No    Alcohol/week: 0.0 standard drinks   Drug use: No   Sexual activity: Not on file  Other Topics Concern   Not on file  Social History Narrative   Not on file   Social Determinants of Health   Financial Resource Strain: Not on file  Food Insecurity: Not on file  Transportation Needs: Not on file  Physical Activity: Not on file  Stress: Not on file  Social Connections: Not on file  Intimate Partner Violence: Not on file      Family  History  Problem Relation Age of Onset   Diabetes Mother    Cancer Neg Hx     Vitals:   02/22/21 1127  BP: (!) 100/52  Pulse: 65  SpO2: 99%  Weight: 67.9 kg (149 lb 9.6 oz)   Wt Readings from Last 3 Encounters:  02/22/21  67.9 kg (149 lb 9.6 oz)  02/10/21 64.9 kg (143 lb 1.6 oz)  01/13/21 66.5 kg (146 lb 11.2 oz)     PHYSICAL EXAM: General:  Thin elderly. No resp difficulty HEENT: normal Neck: supple. no JVD. Carotids 2+ bilat; no bruits. No lymphadenopathy or thryomegaly appreciated. Cor: PMI nondisplaced. Regular rate & rhythm. No rubs, gallops or murmurs. Lungs: mild expiratory wheeze bilaterally Abdomen: soft, nontender, nondistended. No hepatosplenomegaly. No bruits or masses. Good bowel sounds. Extremities: no cyanosis, clubbing, rash, edema Neuro: alert & orientedx3, cranial nerves grossly intact. moves all 4 extremities w/o difficulty. Affect pleasant  ASSESSMENT & PLAN: 1. Chronic systolic CHF : ICM May 6767 EF 15-20% and 12/17 EF 25-30%. Echo 12/2016 EF improved 40% - Echo 12/19 EF 30-35%. - Echo 05/2019 EF 40-45% - ECHO 08/2020 EF 50-55% - Stable NYHA II-III - Volume status stable to slightly dry. Decrease lasix to 40 daily, repeat bmp in 2 weeks - Continue Entresto 97/103 mg BID.  - Continue Coreg 25 mg BID - Continue inspra 25 mg daily - Intolerant to Bidil in the past with dizziness and headaches.  - Continue Corlanor to 5 bid.  - Continue jardiance 10 mg daily.  - check labs    2. CAD: s/p DES to proximal LAD in 09/2015  - No significant angina - Continue ASA, Plavix and atorvastatin.  - LDL at goal 61  3. HTN - Blood pressure well controlled. Continue current regimen.  4. 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  - Continue DAPT   5. DMII - Per PCP. On metformin and Victoza  - Continue jardiance 10 mg daily  6. OSA  -Continue CPAP every night. .   7.  Tobacco use - Discussed smoking cessation  8. CKD Stage  IIIb - baseline probably aroun 1.8 last cr 2.34 - check labs today and in 2 weeks, we will decrease lasix as above and call him if further changes need to be made - on Jardiance  Katherine Roan, MD  12:01 PM  Patient seen and examined with the above-signed Advanced Practice Provider and/or Housestaff. I personally reviewed laboratory data, imaging studies and relevant notes. I independently examined the patient and formulated the important aspects of the plan. I have edited the note to reflect any of my changes or salient points. I have personally discussed the plan with the patient and/or family.  He is doing well. NYHA I. Echo shows near complete recovery of LV function with GDMT. No s/s angina. SCr up a bit. Still smoking  General:  Well appearing. No resp difficulty HEENT: normal Neck: supple. no JVD. Carotids 2+ bilat; no bruits. No lymphadenopathy or thryomegaly appreciated. Cor: PMI nondisplaced. Regular rate & rhythm. No rubs, gallops or murmurs. Lungs: prolonged exp phase Abdomen: soft, nontender, nondistended. No hepatosplenomegaly. No bruits or masses. Good bowel sounds. Extremities: no cyanosis, clubbing, rash, edema Neuro: alert & orientedx3, cranial nerves grossly intact. moves all 4 extremities w/o difficulty. Affect pleasant  EF has now normalized with revascularization and GDMT. Agree that he is probably a little bit dry. Cut lasix back to 40 daily. Recheck BMET today and in 2 weeks. If doing well on low-dose lasix may be able to stop completely (use PRN only) at next visit. Continue to stress need to stop smoking.   Glori Bickers, MD  1:12 PM

## 2021-02-22 NOTE — Addendum Note (Signed)
Encounter addended by: Demetrius Charity, RN on: 02/22/2021 2:43 PM  Actions taken: Charge Capture section accepted

## 2021-03-02 ENCOUNTER — Other Ambulatory Visit (HOSPITAL_COMMUNITY): Payer: Self-pay

## 2021-03-08 ENCOUNTER — Ambulatory Visit (HOSPITAL_COMMUNITY)
Admission: RE | Admit: 2021-03-08 | Discharge: 2021-03-08 | Disposition: A | Discharge status: Home / Self Care | Payer: Medicare Other | Source: Ambulatory Visit | Attending: Cardiology | Admitting: Cardiology

## 2021-03-08 ENCOUNTER — Other Ambulatory Visit: Payer: Self-pay

## 2021-03-08 DIAGNOSIS — I5042 Chronic combined systolic (congestive) and diastolic (congestive) heart failure: Secondary | ICD-10-CM | POA: Insufficient documentation

## 2021-03-08 LAB — BASIC METABOLIC PANEL
Anion gap: 8 (ref 5–15)
BUN: 21 mg/dL (ref 8–23)
CO2: 28 mmol/L (ref 22–32)
Calcium: 9.5 mg/dL (ref 8.9–10.3)
Chloride: 105 mmol/L (ref 98–111)
Creatinine, Ser: 1.56 mg/dL — ABNORMAL HIGH (ref 0.61–1.24)
GFR, Estimated: 48 mL/min — ABNORMAL LOW (ref >60)
Glucose, Bld: 133 mg/dL — ABNORMAL HIGH (ref 70–99)
Potassium: 4.9 mmol/L (ref 3.5–5.1)
Sodium: 141 mmol/L (ref 135–145)

## 2021-03-13 ENCOUNTER — Other Ambulatory Visit (HOSPITAL_COMMUNITY): Payer: Self-pay

## 2021-03-13 MED FILL — Dorzolamide HCl-Timolol Maleate Ophth Soln 2-0.5%: OPHTHALMIC | 50 days supply | Qty: 10 | Fill #0 | Status: AC

## 2021-03-14 ENCOUNTER — Other Ambulatory Visit (HOSPITAL_COMMUNITY): Payer: Self-pay

## 2021-03-14 ENCOUNTER — Other Ambulatory Visit (HOSPITAL_COMMUNITY): Payer: Self-pay | Admitting: Internal Medicine

## 2021-03-15 ENCOUNTER — Other Ambulatory Visit (HOSPITAL_COMMUNITY): Payer: Self-pay | Admitting: *Deleted

## 2021-03-15 ENCOUNTER — Other Ambulatory Visit (HOSPITAL_COMMUNITY): Payer: Self-pay

## 2021-03-15 MED ORDER — ENTRESTO 97-103 MG PO TABS
1.0000 | ORAL_TABLET | Freq: Two times a day (BID) | ORAL | 3 refills | Status: DC
  Filled 2021-03-15: qty 180, 90d supply, fill #0

## 2021-03-22 ENCOUNTER — Other Ambulatory Visit: Payer: Self-pay

## 2021-03-22 ENCOUNTER — Other Ambulatory Visit (HOSPITAL_COMMUNITY): Payer: Self-pay

## 2021-03-22 MED FILL — Ivabradine HCl Tab 5 MG (Base Equiv): ORAL | 90 days supply | Qty: 180 | Fill #1 | Status: AC

## 2021-03-23 ENCOUNTER — Other Ambulatory Visit: Payer: Self-pay | Admitting: Internal Medicine

## 2021-03-23 ENCOUNTER — Other Ambulatory Visit (HOSPITAL_COMMUNITY): Payer: Self-pay

## 2021-03-23 MED ORDER — VICTOZA 18 MG/3ML ~~LOC~~ SOPN
0.6000 mg | PEN_INJECTOR | Freq: Every day | SUBCUTANEOUS | 3 refills | Status: DC
Start: 2021-03-23 — End: 2022-07-19
  Filled 2021-03-23: qty 9, 90d supply, fill #0
  Filled 2021-07-13: qty 9, 90d supply, fill #1
  Filled 2021-10-16: qty 9, 90d supply, fill #2
  Filled 2022-02-01: qty 9, 90d supply, fill #3

## 2021-03-24 ENCOUNTER — Other Ambulatory Visit (HOSPITAL_COMMUNITY): Payer: Self-pay

## 2021-03-30 ENCOUNTER — Other Ambulatory Visit (HOSPITAL_COMMUNITY): Payer: Self-pay

## 2021-03-30 MED FILL — Latanoprost Ophth Soln 0.005%: OPHTHALMIC | 75 days supply | Qty: 7.5 | Fill #0 | Status: AC

## 2021-04-18 ENCOUNTER — Other Ambulatory Visit (HOSPITAL_COMMUNITY): Payer: Self-pay

## 2021-04-18 MED FILL — Clopidogrel Bisulfate Tab 75 MG (Base Equiv): ORAL | 90 days supply | Qty: 90 | Fill #1 | Status: AC

## 2021-04-22 NOTE — Progress Notes (Signed)
error 

## 2021-04-25 ENCOUNTER — Ambulatory Visit (HOSPITAL_COMMUNITY)
Admission: RE | Admit: 2021-04-25 | Discharge: 2021-04-25 | Disposition: A | Discharge status: Home / Self Care | Payer: Medicare Other | Source: Ambulatory Visit | Attending: Family Medicine | Admitting: Family Medicine

## 2021-05-02 ENCOUNTER — Other Ambulatory Visit (HOSPITAL_COMMUNITY): Payer: Self-pay

## 2021-05-04 ENCOUNTER — Ambulatory Visit (INDEPENDENT_AMBULATORY_CARE_PROVIDER_SITE_OTHER): Payer: Medicare Other | Admitting: Podiatry

## 2021-05-04 ENCOUNTER — Encounter: Payer: Self-pay | Admitting: Podiatry

## 2021-05-04 ENCOUNTER — Other Ambulatory Visit: Payer: Self-pay

## 2021-05-04 ENCOUNTER — Other Ambulatory Visit (HOSPITAL_COMMUNITY): Payer: Self-pay

## 2021-05-04 DIAGNOSIS — M79675 Pain in left toe(s): Secondary | ICD-10-CM

## 2021-05-04 DIAGNOSIS — E1140 Type 2 diabetes mellitus with diabetic neuropathy, unspecified: Secondary | ICD-10-CM | POA: Diagnosis not present

## 2021-05-04 DIAGNOSIS — M79674 Pain in right toe(s): Secondary | ICD-10-CM

## 2021-05-04 DIAGNOSIS — F17200 Nicotine dependence, unspecified, uncomplicated: Secondary | ICD-10-CM

## 2021-05-04 DIAGNOSIS — I739 Peripheral vascular disease, unspecified: Secondary | ICD-10-CM

## 2021-05-04 DIAGNOSIS — B351 Tinea unguium: Secondary | ICD-10-CM

## 2021-05-04 DIAGNOSIS — Z794 Long term (current) use of insulin: Secondary | ICD-10-CM | POA: Diagnosis not present

## 2021-05-04 DIAGNOSIS — L03031 Cellulitis of right toe: Secondary | ICD-10-CM

## 2021-05-04 MED ORDER — DOXYCYCLINE HYCLATE 100 MG PO CAPS
100.0000 mg | ORAL_CAPSULE | Freq: Two times a day (BID) | ORAL | 0 refills | Status: AC
  Filled 2021-05-04: qty 14, 7d supply, fill #0

## 2021-05-04 NOTE — Progress Notes (Signed)
Subjective:  Patient ID: Eddie [name], male    DOB: [birth year],  MRN: [id number]  Eddie [name] presents to clinic today for at risk foot care with history of diabetic neuropathy and thick, elongated toenails b/l feet which are tender when wearing enclosed shoe gear.  Patient states blood glucose was 103 mg/dl today.    Patient's wife is present during today's visit.  Patient relates discomfort in right 2nd toe. States toe is tender to touch. Duration greater than two months. He did not disclose discomfort to his wife. He has not attempted any treatment.  Patient also states he is having right lower extremity pain. He experiences leg cramping at night and sometimes in his arch area as well. He is an everyday smoker, smoking since the age of 48. States he has cut down, but it is difficult for him to quit. I did order vascular testing for him in October and he states he never received a phone call to schedule the appointment.  He does relate having neurology testing performed on lower extremities some years ago.  PCP is Gust Rung, DO , and last visit was 08/26/2020.  Allergies  Allergen Reactions   Bidil [Isosorb Dinitrate-Hydralazine] Other (See Comments)    Patient became lightheaded with 1 tablet tid.     Review of Systems: Negative except as noted in the HPI. Objective:   Constitutional Eddie [name] is a pleasant 70 y.o. African American male, WD, WN in NAD. AAO x 3.   Vascular Capillary refill time to digits immediate b/l. Palpable DP pulse(s) left lower extremity Palpable PT pulse(s) left lower extremity.  Diminished DP pulse(s) right lower extremity. Diminished PT pulse(s) right lower extremity. Pedal hair present. Lower extremity skin temperature gradient within normal limits. No ischemia or gangrene noted b/l lower extremities. No cyanosis or clubbing noted.  Neurologic Normal speech. Oriented to person, place, and time. Protective sensation decreased with 10 gram  monofilament b/l. Vibratory sensation intact b/l.  Dermatologic Skin warm and supple b/l lower extremities. Paronychia noted medial border right 2nd digitf with nail border hypertrophy and postinflammatory hyperpigmentation Pinpoint purulent drainage. No penetration into deep tissue. +Tenderness to palpation. No interdigital macerations b/l lower extremities. Toenails 1-5 left, R hallux, R 3rd toe, R 4th toe, and R 5th toe elongated, discolored, dystrophic, thickened, and crumbly with subungual debris and tenderness to dorsal palpation.  Orthopedic: Normal muscle strength 5/5 to all lower extremity muscle groups bilaterally. Hammertoe(s) noted to the 2-5 bilaterally.   Radiographs: None Assessment:   1. Pain due to onychomycosis of toenails of both feet   2. Paronychia of second toe of right foot   3. Intermittent claudication (HCC)   4. Tobacco dependence   5. Type 2 diabetes mellitus with diabetic neuropathy, with long-term current use of insulin (HCC)    Plan:  Patient was evaluated and treated and all questions answered. Consent given for treatment as described below: -Examined patient. -Due to h/o diabetes, tobacco dependence >50 years and symptoms of leg cramping, reordered US ARTERIAL SEG MULTIPLE LE (ABI, SEGMENTAL PRESSURES, PVR'S) to be completed at [facility]. Wife advised if they do not receive a call within a week to give [place] a call. Both wife and patient related understanding. -Patient to continue soft, supportive shoe gear daily. -Toenails 1-5 left, R hallux, R 3rd toe, R 4th toe, and R 5th toe debrided in length and girth without iatrogenic bleeding with sterile nail nipper and dremel.  -Offending nail border debrided  and curretaged R 2nd toe utilizing sterile nail nipper and currette. Digit irrigated with hydrogen peroxide. Border(s) cleansed with alcohol and triple antibiotic ointment applied. Patient noted relief of pain symptoms in office. Dispensed written instructions  for once daily epsom salt soaks for 7 days. -Rx sent to pharmacy for Doxycycline 100 mg po bid x 7 days. -Patient to report any pedal injuries to medical professional immediately. -Patient/POA to call should there be question/concern in the interim.  Return in about 3 months (around 08/04/2021).  Freddie Breech, DPM

## 2021-05-04 NOTE — Patient Instructions (Signed)
EPSOM SALT FOOT SOAK INSTRUCTIONS  *IF YOU HAVE BEEN PRESCRIBED ANTIBIOTICS, TAKE AS INSTRUCTED UNTIL ALL ARE GONE*  Shopping List:  A. Plain epsom salt (not scented) B. Neosporin Cream/Ointment or Bacitracin Cream/Ointment (or prescribed antiobiotic drops/cream/ointment) C. 1-inch fabric band-aids   Place 1/4 cup of epsom salts in 2 quarts of warm tap water. IF YOU ARE DIABETIC, OR HAVE NEUROPATHY, CHECK THE TEMPERATURE OF THE WATER WITH YOUR ELBOW.   Submerge your foot/feet in the solution and soak for 10-15 minutes.      3.  Next, remove your foot/feet from solution, blot dry the affected area.    4.  Apply light amount of antibiotic cream/ointment and cover with fabric band-aid .  5.  This soak should be done once a day for 7 days.   6.  Monitor for any signs/symptoms of infection such as redness, swelling, odor, drainage, increased pain, or non-healing of digit.   7.  Please do not hesitate to call the office and speak to a Nurse or Doctor if you have questions.   8.  If you experience fever, chills, nightsweats, nausea or vomiting with worsening of digit, please go to the emergency room.   

## 2021-05-10 ENCOUNTER — Ambulatory Visit: Payer: Medicare Other | Admitting: Podiatry

## 2021-05-10 NOTE — Progress Notes (Signed)
Advanced [facility] Note   PCP: Lucious Groves, DO HF Cardiologist: Dr. Haroldine Laws  HPI: Eddie [name] is a 70 y.o. male with h/o DM2, CAD s/p NSTEMI with LAD stent in 3/17, CVA, chronic systolic HF with previous EF 15-20%.   Had multiple admissions in May and June 2017 in the setting of decompensated CHF and EColi bacteremia. Readmitted in June 2017 with acute respiratory failure requiring intubation in setting of severe HTN and ADHF. Treated with milrinone which was eventually weaned off.     EF has increased about 10% per year, doing well.    Today he returns for HF follow up his wife. Overall feeling fine. He has mild SOB with increased physical activity. Denies  CP, dizziness, edema, or PND/Orthopnea. Appetite ok. No fever or chills. Weight at home 144-145 pounds. Taking all medications. Smoking 1 cigarette/month. Wearing CPAP every night.  - Echo 08/2020 EF 50-55% - Echo 05/2019 EF 40-45%  - Echo 12/19 EF 30-35%  - Echo 01/11/17: EF 40%.   RV ok.  - Echo 06/19/2016: EF 20-25%.Cannot exclude small  laminar apical thrombus. No mobile or protuberant thrombus is  seen. - Echo 5/15 EF 15-20%  Review of systems complete and found to be negative unless listed in HPI.    Past Medical History:  Diagnosis Date   Blindness of left eye    CAD (coronary artery disease)    a. STEMI 09/2015 w/ DES to Prox LAD   CVA (cerebral infarction)    a. 09/2015: acute small right frontal lobe infarct   Diabetes ([place])    Hypertension    Ischemic cardiomyopathy    OSA (obstructive sleep apnea) 07/06/2017   Mild OSA with AHI of 8.2/hr with hypoxemia as low as 76% now on CPAP at 11cm H2O.   PUD (peptic ulcer disease)    ST elevation myocardial infarction involving left anterior descending (LAD) coronary artery ([place]) 09/28/2015   Current Outpatient Medications  Medication Sig Dispense Refill   Accu-Chek FastClix Lancets MISC Use to check blood sugar 1 time a day 102 each 3    Acetaminophen 325 MG CAPS Take 2 tablets by mouth every 4 (four) hours as needed.     albuterol (VENTOLIN HFA) 108 (90 Base) MCG/ACT inhaler Inhale 2 puffs into the lungs every 6 (six) hours as needed for wheezing or shortness of breath. 8.5 g 2   amLODipine (NORVASC) 5 MG tablet Take 1 tablet (5 mg total) by mouth daily. 30 tablet 3   aspirin EC 81 MG tablet Take 1 tablet (81 mg total) by mouth daily. 90 tablet 3   atorvastatin (LIPITOR) 80 MG tablet Take 1 tablet (80 mg total) by mouth daily. 90 tablet 3   bimatoprost (LUMIGAN) 0.03 % ophthalmic solution Place 1 drop into the right eye at bedtime.     Blood Glucose Monitoring Suppl (ACCU-CHEK GUIDE) w/Device KIT 1 kit by Does not apply route daily. 1 kit 0   brimonidine (ALPHAGAN) 0.2 % ophthalmic solution Apply to eye.     carvedilol (COREG) 25 MG tablet Take 1 tablet (25 mg total) by mouth 2 (two) times daily. 180 tablet 3   clopidogrel (PLAVIX) 75 MG tablet TAKE 1 TABLET (75 MG TOTAL) BY MOUTH DAILY. 90 tablet 3   dorzolamide-timolol (COSOPT) 22.3-6.8 MG/ML ophthalmic solution INSTILL 1 DROP INTO BOTH EYES TWICE DAILY. 15 mL 12   empagliflozin (JARDIANCE) 10 MG TABS tablet Take 1 tablet (10 mg total) by mouth daily before  breakfast. 90 tablet 3   eplerenone (INSPRA) 25 MG tablet Take 1 tablet (25 mg total) by mouth daily. 90 tablet 3   furosemide (LASIX) 40 MG tablet Take 1 tablet (40 mg total) by mouth daily. 60 tablet 3   glucose blood (ACCU-CHEK GUIDE) test strip Use to check blood sugar once daily 100 each 12   Insulin Pen Needle (UNIFINE PENTIPS) 31G X 5 MM MISC Use daily as directed 100 each 5   Insulin Pen Needle 31G X 5 MM MISC USE AS DIRECTED TO INJECT INSULIN DAILY 100 each 5   ivabradine (CORLANOR) 5 MG TABS tablet TAKE 1 TABLET (5 MG TOTAL) BY MOUTH 2 (TWO) TIMES DAILY WITH A MEAL. 180 tablet 5   Lancets Misc. (ACCU-CHEK FASTCLIX LANCET) KIT Use as directed once daily 1 kit 1   latanoprost (XALATAN) 0.005 % ophthalmic solution  SMARTSIG:1 Drop(s) In Eye(s) Every Evening     latanoprost (XALATAN) 0.005 % ophthalmic solution INSTILL 1 DROP INTO BOTH EYES EVERY EVENING. 7.5 mL 12   liraglutide (VICTOZA) 18 MG/3ML SOPN Inject 0.6 mg into the skin daily. 9 mL 3   metFORMIN (GLUCOPHAGE) 1000 MG tablet Take 1 tablet (1,000 mg total) by mouth 2 (two) times daily with a meal. 180 tablet 3   nitroGLYCERIN (NITROSTAT) 0.4 MG SL tablet Use one pill every 5 minutes X 3 if needed for chest pain. Contact MD if pain does not improve 30 tablet 0   sacubitril-valsartan (ENTRESTO) 97-103 MG Take 1 tablet by mouth 2 (two) times daily. 180 tablet 3   vitamin B-12 (CYANOCOBALAMIN) 1000 MCG tablet Take 1 tablet (1,000 mcg total) by mouth daily. 90 tablet 1   No current facility-administered medications for this encounter.   Allergies  Allergen Reactions   Bidil [Isosorb Dinitrate-Hydralazine] Other (See Comments)    Patient became lightheaded with 1 tablet tid.    Social History   Socioeconomic History   Marital status: Married    Spouse name: Not on file   Number of children: Not on file   Years of education: Not on file   Highest education level: Not on file  Occupational History   Occupation: Electrical engineer: [employer]  Tobacco Use   Smoking status: Some Days    Packs/day: 0.20    Types: Cigarettes   Smokeless tobacco: Never  Substance and Sexual Activity   Alcohol use: No    Alcohol/week: 0.0 standard drinks   Drug use: No   Sexual activity: Not on file  Other Topics Concern   Not on file  Social History Narrative   Not on file   Social Determinants of Health   Financial Resource Strain: Not on file  Food Insecurity: Not on file  Transportation Needs: Not on file  Physical Activity: Not on file  Stress: Not on file  Social Connections: Not on file  Intimate Partner Violence: Not on file   Family History  Problem Relation Age of Onset   Diabetes Mother    Cancer Neg Hx    BP 106/64    Pulse 76   Wt 65.2 kg (143 lb 12.8 oz)   SpO2 100%   BMI 23.21 kg/m   Wt Readings from Last 3 Encounters:  05/13/21 65.2 kg (143 lb 12.8 oz)  05/12/21 65.3 kg (144 lb)  02/22/21 67.9 kg (149 lb 9.6 oz)    PHYSICAL EXAM: General:  NAD. No resp difficulty, thin, elderly HEENT: Normal Neck:  Supple. No JVD. Carotids 2+ bilat; no bruits. No lymphadenopathy or thryomegaly appreciated. Cor: PMI nondisplaced. Regular rate & rhythm. No rubs, gallops or murmurs. Lungs: Faint esp wheezes lower lobes Abdomen: Soft, nontender, nondistended. No hepatosplenomegaly. No bruits or masses. Good bowel sounds. Extremities: No cyanosis, clubbing, rash, edema Neuro: Alert & oriented x 3, cranial nerves grossly intact. Moves all 4 extremities w/o difficulty. Affect pleasant.  ASSESSMENT & PLAN: 1. Chronic systolic CHF : ICM  - May 2017 EF 15-20% and 12/17 EF 25-30%.  - Echo 12/2016 EF improved 40% - Echo 12/19 EF 30-35%. - Echo 05/2019 EF 40-45% - Echo 08/2020 EF 50-55% - Stable NYHA I-II. Volume good today, weight down 6 lbs. - Change lasix to 40 mg PRN weight gain/swelling. - Continue Entresto 97/103 mg bid.  - Continue Coreg 25 mg bid. - Continue Inspra 25 mg daily - Intolerant to Bidil in the past with dizziness and headaches.  - Continue Corlanor to 5 bid.  - Continue Jardiance 10 mg daily.  - BMET today.   2. CAD: s/p DES to proximal LAD in 09/2015  - No significant angina - Continue ASA, Plavix and atorvastatin.  - LDL at goal 61.  3. HTN - Blood pressure well controlled.  - Continue current regimen.  4. 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  - Continue DAPT.   5. DMII - Per PCP. On metformin and Victoza.  - Continue Jardiance.  6. OSA  - Continue CPAP every night.   7.  Tobacco use - Smoking 1 cigarette a month. - Discussed complete cessation.  8. CKD Stage IIIb - Baseline probably around 1.8. - on SGLT2i. - BMET today.  Follow up in 6  months with Dr. Haroldine Laws  Rafael Bihari, FNP  8:37 AM

## 2021-05-12 ENCOUNTER — Encounter: Payer: Self-pay | Admitting: Internal Medicine

## 2021-05-12 ENCOUNTER — Ambulatory Visit (INDEPENDENT_AMBULATORY_CARE_PROVIDER_SITE_OTHER): Payer: Medicare Other | Admitting: Internal Medicine

## 2021-05-12 ENCOUNTER — Other Ambulatory Visit: Payer: Self-pay

## 2021-05-12 VITALS — BP 114/54 | HR 83 | Temp 98.2°F | Resp 20 | Ht 66.0 in | Wt 144.0 lb

## 2021-05-12 DIAGNOSIS — N1832 Chronic kidney disease, stage 3b: Secondary | ICD-10-CM

## 2021-05-12 DIAGNOSIS — Z23 Encounter for immunization: Secondary | ICD-10-CM | POA: Diagnosis not present

## 2021-05-12 DIAGNOSIS — I5042 Chronic combined systolic (congestive) and diastolic (congestive) heart failure: Secondary | ICD-10-CM | POA: Diagnosis not present

## 2021-05-12 DIAGNOSIS — Z720 Tobacco use: Secondary | ICD-10-CM

## 2021-05-12 DIAGNOSIS — E1140 Type 2 diabetes mellitus with diabetic neuropathy, unspecified: Secondary | ICD-10-CM | POA: Diagnosis not present

## 2021-05-12 LAB — GLUCOSE, CAPILLARY: Glucose-Capillary: 133 mg/dL — ABNORMAL HIGH (ref 70–99)

## 2021-05-12 LAB — POCT GLYCOSYLATED HEMOGLOBIN (HGB A1C): Hemoglobin A1C: 5.6 % (ref 4.0–5.6)

## 2021-05-12 NOTE — Progress Notes (Signed)
  Subjective:  HPI: [name] is a 70 y.o. male who presents for f/u DM, HF  He has been following closely with Dr. Gala Romney overall his heart failure has been doing very well he has had some issues with dehydration and his Lasix was pared  back to as needed. He has follow up with HF tomorrow. Glucometer review, last 30 days, 30 tests, highest 126 lowest 93.  He has been taking Jardiance 10 mg, metformin and 0.6 mg of Victoza daily.  He reports he feels fine wife is somewhat concerned about him not eating enough/being too thin.  During intake when my nurses asked him about hearing loss he reported some left-sided hearing loss associated with ringing in his left ear has been going on for weeks to months.  No pain.  Mainly notices the ringing when watching things like TV at night.  Overall not bothersome and he would not of brought to my attention otherwise.  Please see Assessment and Plan below for the status of his chronic medical problems.  Objective:  Physical Exam: Vitals:   05/12/21 0953  BP: (!) 114/54  Pulse: 83  Resp: 20  Temp: 98.2 F (36.8 C)  SpO2: 100%  Weight: 144 lb (65.3 kg)  Height: 5\' 6"  (1.676 m)   Body mass index is 23.24 kg/m. Physical Exam Vitals and nursing note reviewed.  Constitutional:      Appearance: Normal appearance.  Cardiovascular:     Rate and Rhythm: Normal rate and regular rhythm.  Pulmonary:     Effort: Pulmonary effort is normal.     Breath sounds: Normal breath sounds.     Comments: Upper airway congestion but no wheezing ronchi or rales throughout lung fields. Abdominal:     General: Abdomen is flat.     Palpations: Abdomen is soft.  Musculoskeletal:     Right lower leg: No edema.     Left lower leg: No edema.  Skin:    General: Skin is warm and dry.  Neurological:     Mental Status: He is alert.   Assessment & Plan:  See Encounters Tab for problem based charting.  Medications Ordered No orders of the defined types were  placed in this encounter.  Other Orders Orders Placed This Encounter  Procedures   Flu Vaccine QUAD High Dose(Fluad)   Glucose, capillary   POC Hbg A1C   Follow Up: Return in about 3 months (around 08/12/2021).

## 2021-05-12 NOTE — Patient Instructions (Addendum)
Call this number [phone] for a the [facility] COVID booster shots.  It is ok to stop taking the Victoza, let me know if the sugars run higher and we can always restart it.  To help quitting smoking I want you to call the [place] Quitline 1-800-QUIT-NOW (226-730-5368)

## 2021-05-13 ENCOUNTER — Encounter (HOSPITAL_COMMUNITY): Payer: Self-pay

## 2021-05-13 ENCOUNTER — Other Ambulatory Visit (HOSPITAL_COMMUNITY): Payer: Self-pay

## 2021-05-13 ENCOUNTER — Ambulatory Visit (HOSPITAL_COMMUNITY)
Admission: RE | Admit: 2021-05-13 | Discharge: 2021-05-13 | Disposition: A | Discharge status: Home / Self Care | Payer: Medicare Other | Source: Ambulatory Visit | Attending: Family Medicine | Admitting: Family Medicine

## 2021-05-13 ENCOUNTER — Telehealth (HOSPITAL_COMMUNITY): Payer: Self-pay | Admitting: Cardiology

## 2021-05-13 VITALS — BP 106/64 | HR 76 | Wt 143.8 lb

## 2021-05-13 DIAGNOSIS — Z7982 Long term (current) use of aspirin: Secondary | ICD-10-CM | POA: Insufficient documentation

## 2021-05-13 DIAGNOSIS — I5022 Chronic systolic (congestive) heart failure: Secondary | ICD-10-CM | POA: Diagnosis not present

## 2021-05-13 DIAGNOSIS — I2510 Atherosclerotic heart disease of native coronary artery without angina pectoris: Secondary | ICD-10-CM | POA: Insufficient documentation

## 2021-05-13 DIAGNOSIS — F17210 Nicotine dependence, cigarettes, uncomplicated: Secondary | ICD-10-CM | POA: Diagnosis not present

## 2021-05-13 DIAGNOSIS — Z716 Tobacco abuse counseling: Secondary | ICD-10-CM | POA: Diagnosis not present

## 2021-05-13 DIAGNOSIS — G4733 Obstructive sleep apnea (adult) (pediatric): Secondary | ICD-10-CM | POA: Insufficient documentation

## 2021-05-13 DIAGNOSIS — I130 Hypertensive heart and chronic kidney disease with heart failure and stage 1 through stage 4 chronic kidney disease, or unspecified chronic kidney disease: Secondary | ICD-10-CM | POA: Insufficient documentation

## 2021-05-13 DIAGNOSIS — Z7985 Long-term (current) use of injectable non-insulin antidiabetic drugs: Secondary | ICD-10-CM | POA: Insufficient documentation

## 2021-05-13 DIAGNOSIS — E1122 Type 2 diabetes mellitus with diabetic chronic kidney disease: Secondary | ICD-10-CM | POA: Diagnosis not present

## 2021-05-13 DIAGNOSIS — Z7902 Long term (current) use of antithrombotics/antiplatelets: Secondary | ICD-10-CM | POA: Insufficient documentation

## 2021-05-13 DIAGNOSIS — Z955 Presence of coronary angioplasty implant and graft: Secondary | ICD-10-CM | POA: Diagnosis not present

## 2021-05-13 DIAGNOSIS — N1832 Chronic kidney disease, stage 3b: Secondary | ICD-10-CM | POA: Diagnosis not present

## 2021-05-13 DIAGNOSIS — Z8673 Personal history of transient ischemic attack (TIA), and cerebral infarction without residual deficits: Secondary | ICD-10-CM | POA: Diagnosis not present

## 2021-05-13 DIAGNOSIS — I10 Essential (primary) hypertension: Secondary | ICD-10-CM | POA: Diagnosis not present

## 2021-05-13 DIAGNOSIS — I5042 Chronic combined systolic (congestive) and diastolic (congestive) heart failure: Secondary | ICD-10-CM

## 2021-05-13 DIAGNOSIS — Z79899 Other long term (current) drug therapy: Secondary | ICD-10-CM | POA: Diagnosis not present

## 2021-05-13 DIAGNOSIS — Z794 Long term (current) use of insulin: Secondary | ICD-10-CM | POA: Insufficient documentation

## 2021-05-13 DIAGNOSIS — Z9989 Dependence on other enabling machines and devices: Secondary | ICD-10-CM | POA: Insufficient documentation

## 2021-05-13 DIAGNOSIS — Z7984 Long term (current) use of oral hypoglycemic drugs: Secondary | ICD-10-CM | POA: Insufficient documentation

## 2021-05-13 DIAGNOSIS — I252 Old myocardial infarction: Secondary | ICD-10-CM | POA: Diagnosis not present

## 2021-05-13 DIAGNOSIS — E1142 Type 2 diabetes mellitus with diabetic polyneuropathy: Secondary | ICD-10-CM

## 2021-05-13 DIAGNOSIS — Z720 Tobacco use: Secondary | ICD-10-CM

## 2021-05-13 LAB — BASIC METABOLIC PANEL
Anion gap: 12 (ref 5–15)
BUN: 44 mg/dL — ABNORMAL HIGH (ref 8–23)
CO2: 23 mmol/L (ref 22–32)
Calcium: 8.8 mg/dL — ABNORMAL LOW (ref 8.9–10.3)
Chloride: 104 mmol/L (ref 98–111)
Creatinine, Ser: 2.84 mg/dL — ABNORMAL HIGH (ref 0.61–1.24)
GFR, Estimated: 23 mL/min — ABNORMAL LOW (ref >60)
Glucose, Bld: 135 mg/dL — ABNORMAL HIGH (ref 70–99)
Potassium: 3.9 mmol/L (ref 3.5–5.1)
Sodium: 139 mmol/L (ref 135–145)

## 2021-05-13 MED ORDER — EPLERENONE 25 MG PO TABS
12.5000 mg | ORAL_TABLET | Freq: Every day | ORAL | 3 refills | Status: DC
  Filled 2021-05-13: qty 45, 90d supply, fill #0
  Filled 2021-08-02: qty 45, 90d supply, fill #1
  Filled 2021-11-01: qty 45, 90d supply, fill #2
  Filled 2022-02-08: qty 45, 90d supply, fill #3

## 2021-05-13 MED ORDER — ENTRESTO 49-51 MG PO TABS
1.0000 | ORAL_TABLET | Freq: Two times a day (BID) | ORAL | 6 refills | Status: DC
  Filled 2021-05-13: qty 60, 30d supply, fill #0
  Filled 2021-11-01: qty 60, 30d supply, fill #1
  Filled 2021-12-08: qty 60, 30d supply, fill #2
  Filled 2022-01-08: qty 60, 30d supply, fill #3
  Filled 2022-02-08: qty 60, 30d supply, fill #4
  Filled 2022-03-12: qty 60, 30d supply, fill #5
  Filled 2022-04-12: qty 60, 30d supply, fill #6

## 2021-05-13 MED ORDER — FUROSEMIDE 40 MG PO TABS
40.0000 mg | ORAL_TABLET | Freq: Every day | ORAL | 3 refills | Status: DC | PRN
Start: 2021-05-13 — End: 2021-08-25

## 2021-05-13 NOTE — Telephone Encounter (Signed)
Patient called.  Patient aware via wife repeat labs 10/31

## 2021-05-13 NOTE — Assessment & Plan Note (Signed)
He really has excellent control of his diabetes A1c came back at 5.6 and glucometer review is really great.  I discussed that if we were to remove medication I would first remove Victoza as he is at a healthy weight and this could contribute to some weight loss and decreased appetite.  He is willing to try this and wants to keep it on his medication list in case he wants to stick with it.  We will remove it at next visit if he has stopped completely.

## 2021-05-13 NOTE — Telephone Encounter (Signed)
-----   Message from Jacklynn Ganong, [place] sent at 05/13/2021 11:12 AM EDT ----- Kidney function very elevated. Please call.  -Stop Entresto, Inspra, and Jardiance x 2 days.  Resume Entresto at 49/51 mg bid (reduced dose), Inspra at 12.5 mg daily (reduced dose) and Jardiance 10 mg daily   - We stopped lasix today and changed to PRN. Needs to stay off.  Repeat BMET in 1 week

## 2021-05-13 NOTE — Assessment & Plan Note (Signed)
Overall he has been doing very well I did repeat a BMP today which returned with elevated serum creatinine and BUN again.  I called him to potentially instruct about holding his Lasix for a couple days however he has already seen heart failure which have already instructed him to do so.  No other changes.

## 2021-05-13 NOTE — Patient Instructions (Signed)
Take Furosemide ONLY AS NEEDED for weight gain of 3 lbs in a day or 5 lbs in a week  Labs done today, we will call you for abnormal results  Your physician recommends that you schedule a follow-up appointment in: 6 months with Dr Gala Romney (April 2023), **PLEASE CALL OUR OFFICE IN February TO SCHEDULE THIS APPOINTMENT  If you have any questions or concerns before your next appointment please send [place] a message through [place] or call our office at [phone].    TO LEAVE A MESSAGE FOR THE NURSE SELECT OPTION 2, PLEASE LEAVE A MESSAGE INCLUDING: YOUR NAME DATE OF BIRTH CALL BACK NUMBER REASON FOR CALL**this is important as we prioritize the call backs  YOU WILL RECEIVE A CALL BACK THE SAME DAY AS LONG AS YOU CALL BEFORE 4:00 PM  At the [facility], you and your health needs are our priority. As part of our continuing mission to provide you with exceptional heart care, we have created designated Provider Care Teams. These Care Teams include your primary Cardiologist (physician) and Advanced Practice Providers (APPs- Physician Assistants and Nurse Practitioners) who all work together to provide you with the care you need, when you need it.   You may see any of the following providers on your designated Care Team at your next follow up: Dr Arvilla Meres Dr Carron Curie, NP Robbie Lis, [place] [facility] [place], [place] Karle Plumber, PharmD   Please be sure to bring in all your medications bottles to every appointment.

## 2021-05-13 NOTE — Assessment & Plan Note (Signed)
Check bmp 

## 2021-05-13 NOTE — Assessment & Plan Note (Signed)
Discussed importance of smoking cessation referred to [place] quit line for additional resources.

## 2021-05-16 ENCOUNTER — Other Ambulatory Visit (HOSPITAL_COMMUNITY): Payer: Self-pay

## 2021-05-17 ENCOUNTER — Other Ambulatory Visit (HOSPITAL_COMMUNITY): Payer: Self-pay

## 2021-05-24 ENCOUNTER — Other Ambulatory Visit: Payer: Self-pay

## 2021-05-24 ENCOUNTER — Ambulatory Visit (HOSPITAL_COMMUNITY)
Admission: RE | Admit: 2021-05-24 | Discharge: 2021-05-24 | Disposition: A | Discharge status: Home / Self Care | Payer: Medicare Other | Source: Ambulatory Visit | Attending: Internal Medicine | Admitting: Internal Medicine

## 2021-05-24 DIAGNOSIS — I5042 Chronic combined systolic (congestive) and diastolic (congestive) heart failure: Secondary | ICD-10-CM | POA: Diagnosis present

## 2021-05-24 LAB — BASIC METABOLIC PANEL
Anion gap: 8 (ref 5–15)
BUN: 30 mg/dL — ABNORMAL HIGH (ref 8–23)
CO2: 27 mmol/L (ref 22–32)
Calcium: 8.9 mg/dL (ref 8.9–10.3)
Chloride: 104 mmol/L (ref 98–111)
Creatinine, Ser: 2.22 mg/dL — ABNORMAL HIGH (ref 0.61–1.24)
GFR, Estimated: 31 mL/min — ABNORMAL LOW (ref >60)
Glucose, Bld: 99 mg/dL (ref 70–99)
Potassium: 3.7 mmol/L (ref 3.5–5.1)
Sodium: 139 mmol/L (ref 135–145)

## 2021-05-25 ENCOUNTER — Telehealth (HOSPITAL_COMMUNITY): Payer: Self-pay

## 2021-05-25 DIAGNOSIS — I5042 Chronic combined systolic (congestive) and diastolic (congestive) heart failure: Secondary | ICD-10-CM

## 2021-05-25 NOTE — Telephone Encounter (Signed)
-----   Message from Jacklynn Ganong, [place] sent at 05/25/2021  7:58 AM EDT ----- Kidney function improving but still not back to baseline. Please make sure to avoid NSAIDs, make sure you are drinking < 64 oz of fluids a day.   Needs repeat BMET in 2-3 weeks to ensure SCr trending back down.

## 2021-05-25 NOTE — Addendum Note (Signed)
Addended by: Sharol Roussel on: 05/25/2021 12:55 PM   Modules accepted: Orders

## 2021-05-25 NOTE — Telephone Encounter (Signed)
Pts wife aware of results and recommendations. Verbalized understanding.  Appt scheduled for 2 weeks.

## 2021-06-08 ENCOUNTER — Ambulatory Visit (HOSPITAL_COMMUNITY)
Admission: RE | Admit: 2021-06-08 | Discharge: 2021-06-08 | Disposition: A | Discharge status: Home / Self Care | Payer: Medicare Other | Source: Ambulatory Visit | Attending: Cardiology | Admitting: Cardiology

## 2021-06-08 DIAGNOSIS — I5042 Chronic combined systolic (congestive) and diastolic (congestive) heart failure: Secondary | ICD-10-CM | POA: Insufficient documentation

## 2021-06-08 LAB — BASIC METABOLIC PANEL
Anion gap: 8 (ref 5–15)
BUN: 33 mg/dL — ABNORMAL HIGH (ref 8–23)
CO2: 27 mmol/L (ref 22–32)
Calcium: 9.3 mg/dL (ref 8.9–10.3)
Chloride: 104 mmol/L (ref 98–111)
Creatinine, Ser: 2.16 mg/dL — ABNORMAL HIGH (ref 0.61–1.24)
GFR, Estimated: 32 mL/min — ABNORMAL LOW (ref >60)
Glucose, Bld: 139 mg/dL — ABNORMAL HIGH (ref 70–99)
Potassium: 4.5 mmol/L (ref 3.5–5.1)
Sodium: 139 mmol/L (ref 135–145)

## 2021-06-16 ENCOUNTER — Other Ambulatory Visit: Payer: Self-pay | Admitting: Internal Medicine

## 2021-06-16 DIAGNOSIS — I10 Essential (primary) hypertension: Secondary | ICD-10-CM

## 2021-06-17 ENCOUNTER — Other Ambulatory Visit (HOSPITAL_COMMUNITY): Payer: Self-pay

## 2021-06-18 MED FILL — Dorzolamide HCl-Timolol Maleate Ophth Soln 2-0.5%: OPHTHALMIC | 50 days supply | Qty: 10 | Fill #1 | Status: AC

## 2021-06-20 ENCOUNTER — Other Ambulatory Visit: Payer: Self-pay | Admitting: Internal Medicine

## 2021-06-20 ENCOUNTER — Other Ambulatory Visit (HOSPITAL_COMMUNITY): Payer: Self-pay

## 2021-06-21 ENCOUNTER — Other Ambulatory Visit (HOSPITAL_COMMUNITY): Payer: Self-pay

## 2021-06-21 MED ORDER — AMLODIPINE BESYLATE 5 MG PO TABS
5.0000 mg | ORAL_TABLET | Freq: Every day | ORAL | 3 refills | Status: DC
  Filled 2021-06-21: qty 90, 90d supply, fill #0
  Filled 2021-09-21: qty 90, 90d supply, fill #1
  Filled 2021-12-08: qty 90, 90d supply, fill #2
  Filled 2022-03-19: qty 90, 90d supply, fill #3

## 2021-06-21 MED ORDER — ACCU-CHEK GUIDE VI STRP
ORAL_STRIP | Freq: Every day | 12 refills | Status: DC
  Filled 2021-06-21 (×2): qty 50, 50d supply, fill #0
  Filled 2021-08-02: qty 50, 50d supply, fill #1
  Filled 2021-09-10: qty 50, 50d supply, fill #2
  Filled 2021-11-01: qty 50, 50d supply, fill #3
  Filled 2021-12-27: qty 50, 50d supply, fill #4
  Filled 2022-02-13: qty 50, 50d supply, fill #5
  Filled 2022-04-02: qty 50, 50d supply, fill #6
  Filled 2022-05-17: qty 50, 50d supply, fill #7

## 2021-06-22 ENCOUNTER — Other Ambulatory Visit (HOSPITAL_COMMUNITY): Payer: Self-pay

## 2021-06-25 MED FILL — Ivabradine HCl Tab 5 MG (Base Equiv): ORAL | 90 days supply | Qty: 180 | Fill #2 | Status: AC

## 2021-06-27 ENCOUNTER — Other Ambulatory Visit (HOSPITAL_COMMUNITY): Payer: Self-pay

## 2021-07-14 ENCOUNTER — Other Ambulatory Visit (HOSPITAL_COMMUNITY): Payer: Self-pay

## 2021-07-14 ENCOUNTER — Other Ambulatory Visit (HOSPITAL_COMMUNITY): Payer: Self-pay | Admitting: Internal Medicine

## 2021-07-14 MED ORDER — FUROSEMIDE 40 MG PO TABS
40.0000 mg | ORAL_TABLET | ORAL | 3 refills | Status: DC | PRN
  Filled 2021-07-14: qty 30, 30d supply, fill #0
  Filled 2021-08-14: qty 30, 30d supply, fill #1
  Filled 2021-09-10: qty 30, 30d supply, fill #2
  Filled 2021-10-16: qty 30, 30d supply, fill #3

## 2021-07-19 ENCOUNTER — Other Ambulatory Visit (HOSPITAL_COMMUNITY): Payer: Self-pay

## 2021-07-19 ENCOUNTER — Other Ambulatory Visit (HOSPITAL_COMMUNITY): Payer: Self-pay | Admitting: Internal Medicine

## 2021-07-19 MED ORDER — CLOPIDOGREL BISULFATE 75 MG PO TABS
75.0000 mg | ORAL_TABLET | Freq: Every day | ORAL | 3 refills | Status: DC
  Filled 2021-07-19: qty 90, 90d supply, fill #0
  Filled 2021-10-16: qty 90, 90d supply, fill #1
  Filled 2022-01-16: qty 90, 90d supply, fill #2
  Filled 2022-04-12: qty 90, 90d supply, fill #3

## 2021-08-03 ENCOUNTER — Other Ambulatory Visit (HOSPITAL_COMMUNITY): Payer: Self-pay

## 2021-08-15 ENCOUNTER — Other Ambulatory Visit (HOSPITAL_COMMUNITY): Payer: Self-pay

## 2021-08-15 ENCOUNTER — Encounter: Payer: Self-pay | Admitting: Podiatry

## 2021-08-15 ENCOUNTER — Other Ambulatory Visit: Payer: Self-pay

## 2021-08-15 ENCOUNTER — Ambulatory Visit (INDEPENDENT_AMBULATORY_CARE_PROVIDER_SITE_OTHER): Payer: Medicare Other | Admitting: Podiatry

## 2021-08-15 DIAGNOSIS — E119 Type 2 diabetes mellitus without complications: Secondary | ICD-10-CM

## 2021-08-15 DIAGNOSIS — M2041 Other hammer toe(s) (acquired), right foot: Secondary | ICD-10-CM | POA: Diagnosis not present

## 2021-08-15 DIAGNOSIS — E1151 Type 2 diabetes mellitus with diabetic peripheral angiopathy without gangrene: Secondary | ICD-10-CM

## 2021-08-15 DIAGNOSIS — F17200 Nicotine dependence, unspecified, uncomplicated: Secondary | ICD-10-CM | POA: Diagnosis not present

## 2021-08-15 DIAGNOSIS — B351 Tinea unguium: Secondary | ICD-10-CM | POA: Diagnosis not present

## 2021-08-15 DIAGNOSIS — M2042 Other hammer toe(s) (acquired), left foot: Secondary | ICD-10-CM

## 2021-08-15 NOTE — Progress Notes (Signed)
ANNUAL DIABETIC FOOT EXAM  Subjective: Eddie [name] presents today for for annual diabetic foot examination.  Patient relates 7 year h/o diabetes.  Patient denies any h/o foot wounds.  Patient occasional symptoms of foot numbness right foot and nocturnal cramping of RLE on occasion.  Patient's blood sugar was 105 mg/dl today.   Risk factors:  h/o tobacco abuse, COPD, diabetes, hyperlipidemia, h/o CVA, h/o MI, HTN, CAD, CHF.  Lucious Groves, DO is patient's PCP. Last visit was 05/21/2021.  Past Medical History:  Diagnosis Date   Blindness of left eye    CAD (coronary artery disease)    a. STEMI 09/2015 w/ DES to Prox LAD   CVA (cerebral infarction)    a. 09/2015: acute small right frontal lobe infarct   Diabetes ([place])    Hypertension    Ischemic cardiomyopathy    OSA (obstructive sleep apnea) 07/06/2017   Mild OSA with AHI of 8.2/hr with hypoxemia as low as 76% now on CPAP at 11cm H2O.   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, right 03/03/2018   OSA (obstructive sleep apnea) 07/06/2017   Shoulder impingement syndrome, left 04/13/2017   Dyspnea on exertion 12/08/2016   CAD in native artery 10/06/2016   CKD (chronic kidney disease) stage 3, GFR 30-59 ml/min (HCC) 10/06/2016   Health care maintenance 10/06/2016   Esophageal reflux    Chronic combined systolic and diastolic heart failure (HCC)    Gait disturbance, post-stroke  12/09/2015   Tobacco abuse    Essential hypertension    Controlled type 2 diabetes mellitus with diabetic neuropathy, without long-term current use of insulin ([place])    History of CVA (cerebrovascular accident)    Cataract, nuclear sclerotic, left eye 03/15/2012   Pseudophakia of right eye 03/15/2012   Past Surgical History:  Procedure Laterality Date   CARDIAC CATHETERIZATION N/A 09/28/2015   Procedure: Left Heart Cath and Coronary Angiography;  Surgeon: Lorretta Harp, MD;  Location: [facility];  Service: Cardiovascular;  Laterality: N/A;   CARDIAC CATHETERIZATION N/A 10/08/2015   Procedure: Left Heart Cath and Coronary Angiography;  Surgeon: Belva Crome, MD;  Location: [facility];  Service: Cardiovascular;  Laterality: N/A;   CATARACT EXTRACTION Right    Repair of Peptic Ulcer     Current Outpatient Medications on File Prior to Visit  Medication Sig Dispense Refill   Accu-Chek FastClix Lancets MISC Use to check blood sugar 1 time a day 102 each 3   Acetaminophen 325 MG CAPS Take 2 tablets by mouth every 4 (four) hours as needed.     albuterol (VENTOLIN HFA) 108 (90 Base) MCG/ACT inhaler Inhale 2 puffs into the lungs every 6 (six) hours as needed for wheezing or shortness of breath. 8.5 g 2   amLODipine (NORVASC) 5 MG tablet Take 1 tablet (5 mg total) by mouth daily. 90 tablet 3   aspirin EC 81 MG tablet Take 1 tablet (81 mg total)  by mouth daily. 90 tablet 3   atorvastatin (LIPITOR) 80 MG tablet Take 1 tablet (80 mg total) by mouth daily. 90 tablet 3   bimatoprost (LUMIGAN) 0.03 % ophthalmic solution Place 1 drop into the right eye at bedtime.     Blood Glucose Monitoring Suppl (ACCU-CHEK GUIDE) w/Device KIT 1 kit by Does not apply route daily. 1 kit 0   brimonidine (ALPHAGAN) 0.2 % ophthalmic solution Apply to eye.     carvedilol (COREG) 25 MG tablet Take 1 tablet (25 mg total) by mouth 2 (two) times daily. 180 tablet 3   clopidogrel (PLAVIX) 75 MG tablet TAKE 1  TABLET (75 MG TOTAL) BY MOUTH DAILY. 90 tablet 3   clopidogrel (PLAVIX) 75 MG tablet Take 1 tablet (75 mg total) by mouth daily. 90 tablet 3   dorzolamide-timolol (COSOPT) 22.3-6.8 MG/ML ophthalmic solution INSTILL 1 DROP INTO BOTH EYES TWICE DAILY. 15 mL 12   empagliflozin (JARDIANCE) 10 MG TABS tablet Take 1 tablet (10 mg total) by mouth daily before breakfast. 90 tablet 3   eplerenone (INSPRA) 25 MG tablet Take 0.5 tablets (12.5 mg total) by mouth daily. 45 tablet 3   furosemide (LASIX) 40 MG tablet Take 1 tablet (40 mg total) by mouth daily as needed (for wt gain of 3 lbs in a day or 5 lbs in a week). 60 tablet 3   furosemide (LASIX) 40 MG tablet Take 1 tablet (40 mg total) by mouth as needed. Take 1 tablet (40 mg total) by mouth as needed (for wt gain of 3 lbs in a day or 5 lbs in a week). 30 tablet 3   glucose blood (ACCU-CHEK GUIDE) test strip Use to check blood sugar once daily 100 each 12   glucose blood (ACCU-CHEK GUIDE) test strip USE TO CHECK BLOOD SUGAR ONCE DAILY 100 strip 12   Insulin Pen Needle (UNIFINE PENTIPS) 31G X 5 MM MISC Use daily as directed 100 each 5   Insulin Pen Needle 31G X 5 MM MISC USE AS DIRECTED TO INJECT INSULIN DAILY 100 each 5   ivabradine (CORLANOR) 5 MG TABS tablet TAKE 1 TABLET (5 MG TOTAL) BY MOUTH 2 (TWO) TIMES DAILY WITH A MEAL. 180 tablet 5   Lancets Misc. (ACCU-CHEK FASTCLIX LANCET) KIT Use as directed once daily 1 kit 1   latanoprost (XALATAN) 0.005 % ophthalmic solution SMARTSIG:1 Drop(s) In Eye(s) Every Evening     latanoprost (XALATAN) 0.005 % ophthalmic solution INSTILL 1 DROP INTO BOTH EYES EVERY EVENING. 7.5 mL 12   liraglutide (VICTOZA) 18 MG/3ML SOPN Inject 0.6 mg into the skin daily. 9 mL 3   metFORMIN (GLUCOPHAGE) 1000 MG tablet Take 1 tablet (1,000 mg total) by mouth 2 (two) times daily with a meal. 180 tablet 3   nitroGLYCERIN (NITROSTAT) 0.4 MG SL tablet Use one pill every 5 minutes X 3 if needed for chest pain. Contact MD if pain does not  improve 30 tablet 0   sacubitril-valsartan (ENTRESTO) 49-51 MG Take 1 tablet by mouth 2 (two) times daily. 60 tablet 6   vitamin B-12 (CYANOCOBALAMIN) 1000 MCG tablet Take 1 tablet (1,000 mcg total) by mouth daily. 90 tablet 1   No current facility-administered medications on file prior to visit.    Allergies  Allergen Reactions   Bidil [Isosorb Dinitrate-Hydralazine] Other (See Comments)    Patient became lightheaded with 1 tablet tid.    Social History   Occupational History   Occupation: Electrical engineer: [employer]  Tobacco Use   Smoking status: Some Days    Packs/day: 0.20    Types: Cigarettes   Smokeless tobacco: Never  Substance and Sexual Activity   Alcohol use: No    Alcohol/week: 0.0 standard drinks   Drug use: No   Sexual activity: Not on file   Family History  Problem Relation Age of Onset   Diabetes Mother    Cancer Neg Hx    Immunization History  Administered Date(s) Administered   Fluad Quad(high Dose 65+) 05/12/2021   Influenza,inj,Quad PF,6+ Mos 10/06/2016, 04/12/2017, 05/01/2018, 07/10/2019, 07/01/2020   PFIZER(Purple Top)SARS-COV-2 Vaccination 10/23/2019, 11/13/2019, 07/15/2020   Pneumococcal Conjugate-13 12/07/2016   Pneumococcal Polysaccharide-23 02/28/2018     Review of Systems: Negative except as noted in the HPI.   Objective: There were no vitals filed for this visit.  Eddie [name] is a pleasant 71 y.o. male in NAD. AAO X 3.  Vascular Examination: CFT <3 seconds b/l LE. Palpable DP pulse(s) left lower extremity Palpable PT pulse(s) left lower extremity Diminished DP pulse(s) right lower extremity. Diminished PT pulse(s) right lower extremity. Pedal hair sparse. No pain with calf compression b/l. No ischemia or gangrene noted b/l LE. No cyanosis or clubbing noted b/l LE.  Dermatological Examination: Pedal integument with normal turgor, texture and tone b/l LE. No open wounds b/l. No interdigital macerations b/l. Toenails 1-5  b/l elongated, thickened, discolored with subungual debris. +Tenderness with dorsal palpation of nailplates. No hyperkeratotic or porokeratotic lesions present.  Musculoskeletal Examination: Muscle strength 5/5 to all lower extremity muscle groups bilaterally. No pain, crepitus or joint limitation noted with ROM bilateral LE. Hammertoe deformity noted 2-5 b/l. Utilizes cane for ambulation assistance.  Footwear Assessment: Does the patient wear appropriate shoes? Yes. Does the patient need inserts/orthotics? No.  Neurological Examination: Protective sensation decreased with 10 gram monofilament b/l.  Hemoglobin A1C Latest Ref Rng & Units 05/12/2021 01/13/2021 08/26/2020  HGBA1C 4.0 - 5.6 % 5.6 5.9(A) 6.6(A)  Some recent data might be hidden   Assessment: 1. Onychomycosis   2. Type II diabetes mellitus with peripheral circulatory disorder (HCC)   3. Acquired hammertoes of both feet   4. Tobacco dependence   5. Encounter for diabetic foot exam ([place])      ADA Risk Categorization: High Risk  Patient has one or more of the following: Loss of protective sensation Absent pedal pulses Severe Foot deformity History of foot ulcer  Plan: -Ordered noninvasive arterial studies segmentals and ABIs for b/l lower extremities. -Diabetic foot examination performed today. -Continue foot and shoe inspections daily. Monitor blood glucose per PCP/Endocrinologist's recommendations. -Mycotic toenails 1-5 bilaterally were debrided in length and girth with sterile nail nippers and dremel without incident. -Patient/POA to call should there be question/concern in the interim.  Return in about 3 months (around 11/13/2021).  Marzetta Board, DPM

## 2021-08-22 ENCOUNTER — Other Ambulatory Visit (INDEPENDENT_AMBULATORY_CARE_PROVIDER_SITE_OTHER): Payer: Medicare Other | Admitting: Podiatry

## 2021-08-22 ENCOUNTER — Other Ambulatory Visit: Payer: Self-pay | Admitting: Podiatry

## 2021-08-22 ENCOUNTER — Ambulatory Visit (HOSPITAL_COMMUNITY)
Admission: RE | Admit: 2021-08-22 | Discharge: 2021-08-22 | Disposition: A | Discharge status: Home / Self Care | Payer: Medicare Other | Source: Ambulatory Visit | Attending: Podiatry | Admitting: Podiatry

## 2021-08-22 ENCOUNTER — Other Ambulatory Visit: Payer: Self-pay

## 2021-08-22 ENCOUNTER — Telehealth: Payer: Self-pay

## 2021-08-22 DIAGNOSIS — F17200 Nicotine dependence, unspecified, uncomplicated: Secondary | ICD-10-CM | POA: Diagnosis not present

## 2021-08-22 DIAGNOSIS — E1151 Type 2 diabetes mellitus with diabetic peripheral angiopathy without gangrene: Secondary | ICD-10-CM

## 2021-08-22 DIAGNOSIS — R6889 Other general symptoms and signs: Secondary | ICD-10-CM

## 2021-08-22 NOTE — Progress Notes (Signed)
Ordered Vascular Consultation for abnormal ABIs/TBIs.

## 2021-08-22 NOTE — Progress Notes (Signed)
Consultation requested with Vascular Surgery Team, Dr. Coral Else, for diabetic with abnormal ABI/TBI. No tissue loss currently.

## 2021-08-25 ENCOUNTER — Other Ambulatory Visit: Payer: Self-pay

## 2021-08-25 ENCOUNTER — Ambulatory Visit (INDEPENDENT_AMBULATORY_CARE_PROVIDER_SITE_OTHER): Payer: Medicare Other | Admitting: Internal Medicine

## 2021-08-25 ENCOUNTER — Encounter: Payer: Self-pay | Admitting: Vascular Surgery

## 2021-08-25 ENCOUNTER — Ambulatory Visit (INDEPENDENT_AMBULATORY_CARE_PROVIDER_SITE_OTHER): Payer: Medicare Other | Admitting: Vascular Surgery

## 2021-08-25 ENCOUNTER — Encounter: Payer: Self-pay | Admitting: Internal Medicine

## 2021-08-25 VITALS — BP 121/56 | HR 73 | Temp 98.6°F | Ht 66.0 in | Wt 148.6 lb

## 2021-08-25 VITALS — BP 152/78 | HR 77 | Temp 97.9°F | Resp 20 | Ht 66.0 in | Wt 148.0 lb

## 2021-08-25 DIAGNOSIS — Z1211 Encounter for screening for malignant neoplasm of colon: Secondary | ICD-10-CM

## 2021-08-25 DIAGNOSIS — E1151 Type 2 diabetes mellitus with diabetic peripheral angiopathy without gangrene: Secondary | ICD-10-CM

## 2021-08-25 DIAGNOSIS — I739 Peripheral vascular disease, unspecified: Secondary | ICD-10-CM

## 2021-08-25 DIAGNOSIS — E1140 Type 2 diabetes mellitus with diabetic neuropathy, unspecified: Secondary | ICD-10-CM | POA: Diagnosis not present

## 2021-08-25 DIAGNOSIS — J439 Emphysema, unspecified: Secondary | ICD-10-CM

## 2021-08-25 DIAGNOSIS — I5042 Chronic combined systolic (congestive) and diastolic (congestive) heart failure: Secondary | ICD-10-CM

## 2021-08-25 DIAGNOSIS — N1832 Chronic kidney disease, stage 3b: Secondary | ICD-10-CM

## 2021-08-25 DIAGNOSIS — Z720 Tobacco use: Secondary | ICD-10-CM

## 2021-08-25 DIAGNOSIS — Z125 Encounter for screening for malignant neoplasm of prostate: Secondary | ICD-10-CM

## 2021-08-25 DIAGNOSIS — R351 Nocturia: Secondary | ICD-10-CM | POA: Insufficient documentation

## 2021-08-25 DIAGNOSIS — I130 Hypertensive heart and chronic kidney disease with heart failure and stage 1 through stage 4 chronic kidney disease, or unspecified chronic kidney disease: Secondary | ICD-10-CM

## 2021-08-25 DIAGNOSIS — Z0000 Encounter for general adult medical examination without abnormal findings: Secondary | ICD-10-CM

## 2021-08-25 DIAGNOSIS — I10 Essential (primary) hypertension: Secondary | ICD-10-CM

## 2021-08-25 DIAGNOSIS — F17210 Nicotine dependence, cigarettes, uncomplicated: Secondary | ICD-10-CM | POA: Diagnosis not present

## 2021-08-25 DIAGNOSIS — E1122 Type 2 diabetes mellitus with diabetic chronic kidney disease: Secondary | ICD-10-CM

## 2021-08-25 LAB — GLUCOSE, CAPILLARY: Glucose-Capillary: 203 mg/dL — ABNORMAL HIGH (ref 70–99)

## 2021-08-25 LAB — POCT GLYCOSYLATED HEMOGLOBIN (HGB A1C): Hemoglobin A1C: 5.6 % (ref 4.0–5.6)

## 2021-08-25 NOTE — Assessment & Plan Note (Signed)
Discussed importance of smoking cessation on COPD and PAD

## 2021-08-25 NOTE — Assessment & Plan Note (Signed)
Reports he goes to sleep around 11pm, usually wakes around 5am to urinate and then more frequent uriantions after that, feels he overall gets a good nights rest, does occasionally feel he has incomplete urination and has to take his time.  Does drink a full water bottle each night before bed.  Overall I discussed that he appears to have some mild nocturia symptoms.  Would restrict fluid intake in the last 2 hours before bed.  Discussed usual causes of BPH and prostate cancer and limited benefit as well as risks of prostate screening with PSA.  He opted for PSA screening.

## 2021-08-25 NOTE — Progress Notes (Signed)
°  Subjective:  HPI: [name] is a 71 y.o. male who presents for f/u DM, CHF, PAD  Please see Assessment and Plan below for the status of his chronic medical problems.  Objective:  Physical Exam: Vitals:   08/25/21 0950  BP: (!) 121/56  Pulse: 73  Temp: 98.6 F (37 C)  TempSrc: Oral  SpO2: 100%  Weight: 148 lb 9.6 oz (67.4 kg)  Height: 5\' 6"  (1.676 m)   Body mass index is 23.98 kg/m. Physical Exam Constitutional:      Appearance: Normal appearance.  Cardiovascular:     Rate and Rhythm: Normal rate.     Heart sounds: Normal heart sounds.  Pulmonary:     Effort: Pulmonary effort is normal.     Breath sounds: Normal breath sounds. No wheezing.  Neurological:     Mental Status: He is alert.   Assessment & Plan:  See Encounters Tab for problem based charting.  Medications Ordered No orders of the defined types were placed in this encounter.  Other Orders Orders Placed This Encounter  Procedures   Fecal occult blood, imunochemical    Standing Status:   Future    Standing Expiration Date:   08/25/2022   CT CHEST LUNG CA SCREEN LOW DOSE W/O CM    Standing Status:   Future    Standing Expiration Date:   08/25/2022    Order Specific Question:   Preferred Imaging Location?    Answer:   [facility]   Glucose, capillary   PSA   BMP8+Anion Gap   Lipid Profile   POC Hbg A1C   Follow Up: Return in about 6 months (around 02/22/2022).

## 2021-08-25 NOTE — Assessment & Plan Note (Signed)
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  He is on DAPT for his CAD and 80mg  atorvastatin with good lipid results.  Still smoking a few cigerettes a day.  Reports can walk about 100 ft before needing to catch his breath but no caudication symptoms.

## 2021-08-25 NOTE — Assessment & Plan Note (Signed)
No complaints, adherent to entresto, jardiance, coreg, eplerenone

## 2021-08-25 NOTE — Assessment & Plan Note (Signed)
Has smoked since a teenager, mostly 1/3 -2/3 ppd smoking, so has over 20 pack/years exposure.  Currently smoking 3-4 cig per day.  Opted for low dose ct screen.  Repeat Annual FIT screen.  Discussed PSA screening.

## 2021-08-25 NOTE — Assessment & Plan Note (Signed)
Recheck BMP today.  

## 2021-08-25 NOTE — Progress Notes (Signed)
ASSESSMENT & PLAN   PERIPHERAL ARTERIAL DISEASE: Based on his exam I think he has multilevel arterial occlusive disease.  He has slightly diminished femoral pulses but likely has significant infrainguinal arterial occlusive disease bilaterally.  However he has stable claudication with no rest pain and no nonhealing ulcers.  We have discussed importance of tobacco cessation (3 min).  I have also encouraged him to get on a structured walking program.  I have ordered follow-up ABIs in 9 months and I will see him back at that time.  If his symptoms worsen then certainly we could consider arteriography.  He does have a history of chronic kidney disease and arteriography would likely have to be done with CO2 and limited contrast.  However currently he only has stable claudication so I would not recommend an aggressive approach unless his symptoms progress.  I will see him back in 9 months.  He knows to call sooner if he has problems.  REASON FOR CONSULT:    To evaluate for peripheral arterial disease.  The consult is requested by Dr. Consuelo Pandy.  HPI:   Eddie [name] is a 71 y.o. male who was referred for evaluation of peripheral arterial disease.  On my history the patient describes some pain from his knees down in both legs which is brought on by ambulation and relieved with rest.  He can walk about 100 yards before experiencing symptoms.  His symptoms began in 2016.  There have been no significant change in his symptoms over the last few years.  His symptoms are equal on both sides.  There are no other aggravating or alleviating factors.  He denies any history of rest pain or nonhealing ulcers.  He did have a small wound on his right great toe recently which healed without any problem.  His risk factors for peripheral vascular disease include diabetes, hypertension, hypercholesterolemia, a family history of premature cardiovascular disease, and tobacco use.  He smokes about a quarter of a pack per day and  has been smoking since he was 71 years old.  He does have multiple other medical issues including COPD, obstructive sleep apnea, coronary artery disease, congestive heart failure, previous CVA, and CKD 3.  Past Medical History:  Diagnosis Date   Blindness of left eye    CAD (coronary artery disease)    a. STEMI 09/2015 w/ DES to Prox LAD   CVA (cerebral infarction)    a. 09/2015: acute small right frontal lobe infarct   Diabetes ([place])    Hypertension    Ischemic cardiomyopathy    OSA (obstructive sleep apnea) 07/06/2017   Mild OSA with AHI of 8.2/hr with hypoxemia as low as 76% now on CPAP at 11cm H2O.   PUD (peptic ulcer disease)    ST elevation myocardial infarction involving left anterior descending (LAD) coronary artery ([place]) 09/28/2015    Family History  Problem Relation Age of Onset   Diabetes Mother    Cancer Neg Hx     SOCIAL HISTORY: Social History   Tobacco Use   Smoking status: Some Days    Packs/day: 0.20    Types: Cigarettes   Smokeless tobacco: Never  Substance Use Topics   Alcohol use: No    Alcohol/week: 0.0 standard drinks    Allergies  Allergen Reactions   Bidil [Isosorb Dinitrate-Hydralazine] Other (See Comments)    Patient became lightheaded with 1 tablet tid.     Current Outpatient Medications  Medication Sig Dispense Refill   Accu-Chek FastClix Lancets  MISC Use to check blood sugar 1 time a day 102 each 3   Acetaminophen 325 MG CAPS Take 2 tablets by mouth every 4 (four) hours as needed.     albuterol (VENTOLIN HFA) 108 (90 Base) MCG/ACT inhaler Inhale 2 puffs into the lungs every 6 (six) hours as needed for wheezing or shortness of breath. 8.5 g 2   amLODipine (NORVASC) 5 MG tablet Take 1 tablet (5 mg total) by mouth daily. 90 tablet 3   aspirin EC 81 MG tablet Take 1 tablet (81 mg total) by mouth daily. 90 tablet 3   atorvastatin (LIPITOR) 80 MG tablet Take 1 tablet (80 mg total) by mouth daily. 90 tablet 3   bimatoprost (LUMIGAN) 0.03 %  ophthalmic solution Place 1 drop into the right eye at bedtime.     Blood Glucose Monitoring Suppl (ACCU-CHEK GUIDE) w/Device KIT 1 kit by Does not apply route daily. 1 kit 0   brimonidine (ALPHAGAN) 0.2 % ophthalmic solution Apply to eye.     carvedilol (COREG) 25 MG tablet Take 1 tablet (25 mg total) by mouth 2 (two) times daily. 180 tablet 3   clopidogrel (PLAVIX) 75 MG tablet Take 1 tablet (75 mg total) by mouth daily. 90 tablet 3   dorzolamide-timolol (COSOPT) 22.3-6.8 MG/ML ophthalmic solution INSTILL 1 DROP INTO BOTH EYES TWICE DAILY. 15 mL 12   empagliflozin (JARDIANCE) 10 MG TABS tablet Take 1 tablet (10 mg total) by mouth daily before breakfast. 90 tablet 3   eplerenone (INSPRA) 25 MG tablet Take 0.5 tablets (12.5 mg total) by mouth daily. 45 tablet 3   furosemide (LASIX) 40 MG tablet Take 1 tablet (40 mg total) by mouth as needed. Take 1 tablet (40 mg total) by mouth as needed (for wt gain of 3 lbs in a day or 5 lbs in a week). 30 tablet 3   glucose blood (ACCU-CHEK GUIDE) test strip USE TO CHECK BLOOD SUGAR ONCE DAILY 100 strip 12   Insulin Pen Needle (UNIFINE PENTIPS) 31G X 5 MM MISC Use daily as directed 100 each 5   Insulin Pen Needle 31G X 5 MM MISC USE AS DIRECTED TO INJECT INSULIN DAILY 100 each 5   ivabradine (CORLANOR) 5 MG TABS tablet TAKE 1 TABLET (5 MG TOTAL) BY MOUTH 2 (TWO) TIMES DAILY WITH A MEAL. 180 tablet 5   Lancets Misc. (ACCU-CHEK FASTCLIX LANCET) KIT Use as directed once daily 1 kit 1   latanoprost (XALATAN) 0.005 % ophthalmic solution SMARTSIG:1 Drop(s) In Eye(s) Every Evening     latanoprost (XALATAN) 0.005 % ophthalmic solution INSTILL 1 DROP INTO BOTH EYES EVERY EVENING. 7.5 mL 12   liraglutide (VICTOZA) 18 MG/3ML SOPN Inject 0.6 mg into the skin daily. 9 mL 3   metFORMIN (GLUCOPHAGE) 1000 MG tablet Take 1 tablet (1,000 mg total) by mouth 2 (two) times daily with a meal. 180 tablet 3   nitroGLYCERIN (NITROSTAT) 0.4 MG SL tablet Use one pill every 5 minutes X 3  if needed for chest pain. Contact MD if pain does not improve 30 tablet 0   sacubitril-valsartan (ENTRESTO) 49-51 MG Take 1 tablet by mouth 2 (two) times daily. 60 tablet 6   vitamin B-12 (CYANOCOBALAMIN) 1000 MCG tablet Take 1 tablet (1,000 mcg total) by mouth daily. 90 tablet 1   No current facility-administered medications for this visit.    REVIEW OF SYSTEMS:  $RemoveB'[X]'RhNlICdr$  denotes positive finding, $RemoveBeforeDEI'[ ]'CyUYPzNxPXPNXPTb$  denotes negative finding Cardiac  Comments:  Chest pain or chest pressure:  Shortness of breath upon exertion:    Short of breath when lying flat:    Irregular heart rhythm:        Vascular    Pain in calf, thigh, or hip brought on by ambulation: x   Pain in feet at night that wakes you up from your sleep:     Blood clot in your veins:    Leg swelling:         Pulmonary    Oxygen at home:    Productive cough:     Wheezing:         Neurologic    Sudden weakness in arms or legs:     Sudden numbness in arms or legs:     Sudden onset of difficulty speaking or slurred speech:    Temporary loss of vision in one eye:     Problems with dizziness:         Gastrointestinal    Blood in stool:     Vomited blood:         Genitourinary    Burning when urinating:     Blood in urine:        Psychiatric    Major depression:         Hematologic    Bleeding problems:    Problems with blood clotting too easily:        Skin    Rashes or ulcers:        Constitutional    Fever or chills:    -  PHYSICAL EXAM:   Vitals:   08/25/21 1319  BP: (!) 152/78  Pulse: 77  Resp: 20  Temp: 97.9 F (36.6 C)  SpO2: 99%  Weight: 148 lb (67.1 kg)  Height: $Remove'5\' 6"'pJVsojI$  (1.676 m)   Body mass index is 23.89 kg/m.  GENERAL: The patient is a well-nourished male, in no acute distress. The vital signs are documented above. CARDIAC: There is a regular rate and rhythm.  VASCULAR: I do not detect carotid bruits. He has palpable femoral pulses although slightly diminished. I cannot palpate pedal  pulses. Does have a monophasic but fairly brisk Doppler signals in the dorsalis pedis and posterior tibial positions bilaterally. He has no significant lower extremity swelling. PULMONARY: There is good air exchange bilaterally without wheezing or rales. ABDOMEN: Soft and non-tender with normal pitched bowel sounds.  I do not palpate an aneurysm. MUSCULOSKELETAL: There are no major deformities. NEUROLOGIC: No focal weakness or paresthesias are detected. SKIN: There are no ulcers or rashes noted. PSYCHIATRIC: The patient has a normal affect.  DATA:    ARTERIAL DOPPLER STUDY: I have reviewed the arterial Doppler study that was done on 08/22/2021.  On the right side is a monophasic dorsalis pedis and posterior tibial signal.  ABIs 100% although this is likely falsely elevated.  Toe pressures 27 mmHg.  On the left side there is a monophasic dorsalis pedis and posterior tibial signal.  The arteries are calcified and an ABI could not be obtained.  Toe pressures 31 mmHg.  Deitra Mayo [facility]

## 2021-08-25 NOTE — Assessment & Plan Note (Signed)
Checking BG once daily. Readings are all between 90s-150.  A1c excellent at 5.6%.  Remains on metformin and low dose victoza without any side effects.  Wishes to remain on these.    Will continue Metformin and Victoza given CVD benefits and excellent BG cntrol wo hypoglycemia

## 2021-08-27 LAB — BMP8+ANION GAP
Anion Gap: 14 mmol/L (ref 10.0–18.0)
BUN/Creatinine Ratio: 11 (ref 10–24)
BUN: 17 mg/dL (ref 8–27)
CO2: 24 mmol/L (ref 20–29)
Calcium: 9 mg/dL (ref 8.6–10.2)
Chloride: 101 mmol/L (ref 96–106)
Creatinine, Ser: 1.53 mg/dL — ABNORMAL HIGH (ref 0.76–1.27)
Glucose: 172 mg/dL — ABNORMAL HIGH (ref 70–99)
Potassium: 4 mmol/L (ref 3.5–5.2)
Sodium: 139 mmol/L (ref 134–144)
eGFR: 49 mL/min/{1.73_m2} — ABNORMAL LOW (ref >59)

## 2021-08-27 LAB — LIPID PANEL
Chol/HDL Ratio: 2.5 ratio (ref 0.0–5.0)
Cholesterol, Total: 115 mg/dL (ref 100–199)
HDL: 46 mg/dL (ref >39)
LDL Chol Calc (NIH): 56 mg/dL (ref 0–99)
Triglycerides: 59 mg/dL (ref 0–149)
VLDL Cholesterol Cal: 13 mg/dL (ref 5–40)

## 2021-08-27 LAB — PSA: Prostate Specific Ag, Serum: 4.1 ng/mL — ABNORMAL HIGH (ref 0.0–4.0)

## 2021-08-29 ENCOUNTER — Other Ambulatory Visit: Payer: Self-pay | Admitting: *Deleted

## 2021-08-29 DIAGNOSIS — I739 Peripheral vascular disease, unspecified: Secondary | ICD-10-CM

## 2021-08-30 ENCOUNTER — Other Ambulatory Visit: Payer: Medicare Other

## 2021-08-30 ENCOUNTER — Other Ambulatory Visit: Payer: Self-pay

## 2021-08-30 DIAGNOSIS — Z1211 Encounter for screening for malignant neoplasm of colon: Secondary | ICD-10-CM

## 2021-08-31 LAB — FECAL OCCULT BLOOD, IMMUNOCHEMICAL: Fecal Occult Bld: NEGATIVE

## 2021-09-12 ENCOUNTER — Other Ambulatory Visit (HOSPITAL_COMMUNITY): Payer: Self-pay

## 2021-09-21 ENCOUNTER — Other Ambulatory Visit (HOSPITAL_COMMUNITY): Payer: Self-pay | Admitting: Internal Medicine

## 2021-09-22 ENCOUNTER — Other Ambulatory Visit (HOSPITAL_COMMUNITY): Payer: Self-pay

## 2021-09-22 MED ORDER — IVABRADINE HCL 5 MG PO TABS
5.0000 mg | ORAL_TABLET | Freq: Two times a day (BID) | ORAL | 5 refills | Status: DC
  Filled 2021-09-22: qty 180, 90d supply, fill #0
  Filled 2021-12-25: qty 180, 90d supply, fill #1
  Filled 2022-03-29: qty 180, 90d supply, fill #2
  Filled 2022-06-29: qty 180, 90d supply, fill #3

## 2021-10-16 ENCOUNTER — Other Ambulatory Visit: Payer: Self-pay

## 2021-10-17 ENCOUNTER — Other Ambulatory Visit (HOSPITAL_COMMUNITY): Payer: Self-pay

## 2021-10-18 ENCOUNTER — Other Ambulatory Visit: Payer: Self-pay

## 2021-10-18 ENCOUNTER — Other Ambulatory Visit (HOSPITAL_COMMUNITY): Payer: Self-pay

## 2021-10-19 ENCOUNTER — Other Ambulatory Visit (HOSPITAL_COMMUNITY): Payer: Self-pay

## 2021-10-19 MED ORDER — DORZOLAMIDE HCL-TIMOLOL MAL 2-0.5 % OP SOLN
1.0000 [drp] | Freq: Two times a day (BID) | OPHTHALMIC | 0 refills | Status: DC
  Filled 2021-10-19: qty 10, 50d supply, fill #0

## 2021-10-19 MED ORDER — LATANOPROST 0.005 % OP SOLN
1.0000 [drp] | Freq: Every day | OPHTHALMIC | 0 refills | Status: DC
  Filled 2021-10-19: qty 7.5, 75d supply, fill #0

## 2021-10-28 ENCOUNTER — Other Ambulatory Visit (HOSPITAL_COMMUNITY): Payer: Self-pay

## 2021-10-28 LAB — HM DIABETES EYE EXAM

## 2021-10-28 MED ORDER — DORZOLAMIDE HCL-TIMOLOL MAL 2-0.5 % OP SOLN
1.0000 [drp] | Freq: Two times a day (BID) | OPHTHALMIC | 12 refills | Status: DC
  Filled 2021-10-28: qty 20, 100d supply, fill #0
  Filled 2022-05-04: qty 10, 50d supply, fill #0
  Filled 2022-09-25: qty 10, 50d supply, fill #1

## 2021-10-28 MED ORDER — LATANOPROST 0.005 % OP SOLN
1.0000 [drp] | Freq: Every evening | OPHTHALMIC | 12 refills | Status: DC
  Filled 2021-10-28 – 2022-03-21 (×2): qty 7.5, 75d supply, fill #0
  Filled 2022-09-25: qty 7.5, 75d supply, fill #1

## 2021-11-01 ENCOUNTER — Other Ambulatory Visit (HOSPITAL_COMMUNITY): Payer: Self-pay

## 2021-11-01 ENCOUNTER — Other Ambulatory Visit (HOSPITAL_COMMUNITY): Payer: Self-pay | Admitting: Internal Medicine

## 2021-11-01 MED ORDER — FUROSEMIDE 40 MG PO TABS
40.0000 mg | ORAL_TABLET | ORAL | 3 refills | Status: DC | PRN
  Filled 2021-11-01 – 2021-11-19 (×2): qty 30, 30d supply, fill #0
  Filled 2021-12-18: qty 30, 30d supply, fill #1
  Filled 2022-01-16: qty 30, 30d supply, fill #2
  Filled 2022-02-13: qty 30, 30d supply, fill #3

## 2021-11-15 ENCOUNTER — Encounter: Payer: Self-pay | Admitting: Podiatry

## 2021-11-15 ENCOUNTER — Ambulatory Visit (INDEPENDENT_AMBULATORY_CARE_PROVIDER_SITE_OTHER): Payer: Medicare Other | Admitting: Podiatry

## 2021-11-15 ENCOUNTER — Ambulatory Visit: Payer: Medicare Other

## 2021-11-15 DIAGNOSIS — M79674 Pain in right toe(s): Secondary | ICD-10-CM | POA: Diagnosis not present

## 2021-11-15 DIAGNOSIS — M79675 Pain in left toe(s): Secondary | ICD-10-CM

## 2021-11-15 DIAGNOSIS — B351 Tinea unguium: Secondary | ICD-10-CM | POA: Diagnosis not present

## 2021-11-15 DIAGNOSIS — R6889 Other general symptoms and signs: Secondary | ICD-10-CM | POA: Diagnosis not present

## 2021-11-15 DIAGNOSIS — E1142 Type 2 diabetes mellitus with diabetic polyneuropathy: Secondary | ICD-10-CM | POA: Diagnosis not present

## 2021-11-15 DIAGNOSIS — M2041 Other hammer toe(s) (acquired), right foot: Secondary | ICD-10-CM

## 2021-11-15 DIAGNOSIS — E1140 Type 2 diabetes mellitus with diabetic neuropathy, unspecified: Secondary | ICD-10-CM

## 2021-11-15 DIAGNOSIS — I739 Peripheral vascular disease, unspecified: Secondary | ICD-10-CM

## 2021-11-15 DIAGNOSIS — Z716 Tobacco abuse counseling: Secondary | ICD-10-CM | POA: Diagnosis not present

## 2021-11-15 NOTE — Progress Notes (Signed)
SITUATION ?Reason for Consult: Evaluation for Prefabricated Diabetic Shoes and Custom Diabetic Inserts. ?Patient / Caregiver Report: Patient would like well fitting shoes ? ?OBJECTIVE DATA: ?Patient History / Diagnosis:  ?  ICD-10-CM   ?1. Controlled type 2 diabetes mellitus with diabetic neuropathy, without long-term current use of insulin (HCC)  E11.40   ?  ?2. Acquired hammertoes of both feet  M20.41   ? M20.42   ?  ? ? ?Physician Treating Diabetes:  Gust Rung, DO ? ?Current or Previous Devices:   Current user ? ?In-Person Foot Examination: ?Ulcers & Callousing:   None ?Deformities:    Hammertoes ?Sensation:    Compromised  ?Shoe Size:     10W ? ?ORTHOTIC RECOMMENDATION ?Recommended Devices: ?- 1x pair prefabricated PDAC approved diabetic shoes; Patient Selected Apex [id number] Size 10W ?- 3x pair custom-to-patient PDAC approved vacuum formed diabetic insoles. ? ?GOALS OF SHOES AND INSOLES ?- Reduce shear and pressure ?- Reduce / Prevent callus formation ?- Reduce / Prevent ulceration ?- Protect the fragile healing compromised diabetic foot. ? ?Patient would benefit from diabetic shoes and inserts as patient has diabetes mellitus and the patient has one or more of the following conditions: ?- History of partial or complete amputation of the foot ?- History of previous foot ulceration. ?- History of pre-ulcerative callus ?- Peripheral neuropathy with evidence of callus formation ?- Foot deformity ?- Poor circulation ? ?ACTIONS PERFORMED ?Potential out of pocket cost was communicated to patient. Patient understood and consented to measurement and casting. Patient was casted for insoles via crush box and measured for shoes via brannock device. Procedure was explained and patient tolerated procedure well. All questions were answered and concerns addressed. Casts were shipped to central fabrication for HOLD until Certificate of Medical Necessity or otherwise necessary authorization from insurance is  obtained. ? ?PLAN ?Shoes are to be ordered and casts released from hold once all appropriate paperwork is complete. Patient is to be contacted and scheduled for fitting once shoes and insoles have been fabricated and received. ? ?

## 2021-11-15 NOTE — Patient Instructions (Signed)
Smoking Tobacco Information, Adult ?Smoking tobacco can be harmful to your health. Tobacco contains a toxic colorless chemical called nicotine. Nicotine causes changes in your brain that make you want more and more. This is called addiction. This can make it hard to stop smoking once you start. Tobacco also has other toxic chemicals that can hurt your body and raise your risk of many cancers. ?Menthol or "lite" tobacco or cigarette brands are not safer than regular brands. ?How can smoking tobacco affect me? ?Smoking tobacco puts you at risk for: ?Cancer. Smoking is most commonly associated with lung cancer, but can also lead to cancer in other parts of the body. ?Chronic obstructive pulmonary disease (COPD). This is a long-term lung condition that makes it hard to breathe. It also gets worse over time. ?High blood pressure (hypertension), heart disease, stroke, heart attack, and lung infections, such as pneumonia. ?Cataracts. This is when the lenses in the eyes become clouded. ?Digestive problems. This may include peptic ulcers, heartburn, and gastroesophageal reflux disease (GERD). ?Oral health problems, such as gum disease, mouth sores, and tooth loss. ?Loss of taste and smell. ?Smoking also affects how you look and smell. Smoking may cause: ?Wrinkles. ?Yellow or stained teeth, fingers, and fingernails. ?Bad breath. ?Bad-smelling clothes and hair. ?Smoking tobacco can also affect your social life, because: ?It may be challenging to find places to smoke when away from home. Many workplaces, restaurants, hotels, and public places are tobacco-free. ?Smoking is expensive. This is due to the cost of tobacco and the long-term costs of treating health problems from smoking. ?Secondhand smoke may affect those around you. Secondhand smoke can cause lung cancer, breathing problems, and heart disease. Children of smokers have a higher risk for: ?Sudden infant death syndrome (SIDS). ?Ear infections. ?Lung infections. ?What  actions can I take to prevent health problems? ?Quit smoking ? ?Do not start smoking. Quit if you already smoke. ?Do not replace cigarette smoking with vaping devices, such as e-cigarettes. ?Make a plan to quit smoking and commit to it. Look for programs to help you, and ask your health care provider for recommendations and ideas. Set a date and write down all the reasons you want to quit. ?Let your friends and family know you are quitting so they can help and support you. Consider finding friends who also want to quit. It can be easier to quit with someone else, so that you can support each other. ?Talk with your health care provider about using nicotine replacement medicines to help you quit. These include gum, lozenges, patches, sprays, or pills. ?If you try to quit but return to smoking, stay positive. It is common to slip up when you first quit, so take it one day at a time. ?Be prepared for cravings. When you feel the urge to smoke, chew gum or suck on hard candy. ?Lifestyle ?Stay busy. ?Take care of your body. Get plenty of exercise, eat a healthy diet, and drink plenty of water. ?Find ways to manage your stress, such as meditation, yoga, exercise, or time spent with friends and family. ?Ask your health care provider about having regular tests (screenings) to check for cancer. This may include blood tests, imaging tests, and other tests. ?Where to find support ?To get support to quit smoking, consider: ?Asking your health care provider for more information and resources. ?Joining a support group for people who want to quit smoking in your local community. There are many effective programs that may help you to quit. ?Calling the smokefree.gov counselor   helpline at 1-800-QUIT-NOW 770-396-7085). ?Where to find more information ?You may find more information about quitting smoking from: ?Centers for Disease Control and Prevention: http://www.osborne.com/ ?BankRights[employer]uy: smokefree.gov ?American Lung Association:  freedomfromsmoking.org ?Contact a health care provider if: ?You have problems breathing. ?Your lips, nose, or fingers turn blue. ?You have chest pain. ?You are coughing up blood. ?You feel like you will faint. ?You have other health changes that cause you to worry. ?Summary ?Smoking tobacco can negatively affect your health, the health of those around you, your finances, and your social life. ?Do not start smoking. Quit if you already smoke. If you need help quitting, ask your health care provider. ?Consider joining a support group for people in your local community who want to quit smoking. There are many effective programs that may help you to quit. ?This information is not intended to replace advice given to you by your health care provider. Make sure you discuss any questions you have with your health care provider. ?Document Revised: 07/05/2021 Document Reviewed: 07/05/2021 ?Elsevier Patient Education ? 2023 Elsevier Inc. ? ? ?Peripheral Vascular Disease ? ?Peripheral vascular disease (PVD) is a disease of the blood vessels that carry blood from the heart to the rest of the body. PVD is also called peripheral artery disease (PAD) or poor circulation. PVD affects most of the body. But it affects the legs and feet the most. ?PVD can lead to acute limb ischemia. This happens when there is a sudden stop of blood flow to an arm or leg. This is a medical emergency. ?What are the causes? ?The most common cause of PVD is a buildup of a fatty substance (plaque) inside your arteries. This decreases blood flow. Plaque can break off and block blood in a smaller artery. This can lead to acute limb ischemia. ?Other common causes of PVD include: ?Blood clots inside the blood vessels. ?Injuries to blood vessels. ?Irritation and swelling of blood vessels. ?Sudden tightening of the blood vessel (spasms). ?What increases the risk? ?A family history of PVD. ?Medical conditions, including: ?High cholesterol. ?Diabetes. ?High blood  pressure. ?Heart disease. ?Past problems with blood clots. ?Past injury, such as burns or a broken bone. ?Other conditions, such as: ?Buerger's disease. This is caused by swollen or irritated blood vessels in your hands and feet. ?Arthritis. ?Birth defects that affect the arteries in your legs. ?Kidney disease. ?Using tobacco or nicotine products. ?Not getting enough exercise. ?Being very overweight (obese). ?Being 46 years old or older. ?What are the signs or symptoms? ?Cramps in your butt, legs, and feet. ?Pain and weakness in your legs when you are active that goes away when you rest. ?Leg pain when at rest. ?Leg numbness, tingling, or weakness. ?Coldness in a leg or foot, especially when compared with the other leg or foot. ?Skin or hair changes. These can include: ?Hair loss. ?Shiny skin. ?Pale or bluish skin. ?Thick toenails. ?Being unable to get or keep an erection. ?Tiredness (fatigue). ?Weak pulse or no pulse in the feet. ?Wounds and sores on the toes, feet, or legs. These take longer to heal. ?How is this treated? ?Underlying causes are treated first. Other conditions, like diabetes, high cholesterol, and blood pressure, are also treated. Treatment may include: ?Lifestyle changes, such as: ?Quitting smoking. ?Getting regular exercise. ?Having a diet low in fat and cholesterol. ?Not drinking alcohol. ?Taking medicines, such as: ?Blood thinners. ?Medicines to improve blood flow. ?Medicines to improve your blood cholesterol. ?Procedures to: ?Open the arteries and restore blood flow. ?Insert a small mesh  tube (stent) to keep a blocked vessel open. ?Create a new path for blood to flow to the body (peripheral bypass). ?Remove dead tissue from a wound. ?Remove an affected leg or arm. ?Follow these instructions at home: ?Medicines ?Take over-the-counter and prescription medicines only as told by your doctor. ?If you are taking blood thinners: ?Talk with your doctor before you take any medicines that have aspirin,  or NSAIDs, such as ibuprofen. ?Take medicines exactly as told. Take them at the same time each day. ?Avoid doing things that could hurt or bruise you. Take action to prevent falls. ?Wear an alert bracelet or

## 2021-11-16 ENCOUNTER — Ambulatory Visit (HOSPITAL_COMMUNITY)
Admission: RE | Admit: 2021-11-16 | Discharge: 2021-11-16 | Disposition: A | Discharge status: Home / Self Care | Payer: Medicare Other | Source: Ambulatory Visit | Attending: Internal Medicine | Admitting: Internal Medicine

## 2021-11-16 ENCOUNTER — Encounter (HOSPITAL_COMMUNITY): Payer: Self-pay | Admitting: Internal Medicine

## 2021-11-16 VITALS — BP 116/68 | HR 78 | Wt 141.0 lb

## 2021-11-16 DIAGNOSIS — N1832 Chronic kidney disease, stage 3b: Secondary | ICD-10-CM

## 2021-11-16 DIAGNOSIS — Z7984 Long term (current) use of oral hypoglycemic drugs: Secondary | ICD-10-CM | POA: Insufficient documentation

## 2021-11-16 DIAGNOSIS — F17210 Nicotine dependence, cigarettes, uncomplicated: Secondary | ICD-10-CM | POA: Insufficient documentation

## 2021-11-16 DIAGNOSIS — F17200 Nicotine dependence, unspecified, uncomplicated: Secondary | ICD-10-CM | POA: Diagnosis not present

## 2021-11-16 DIAGNOSIS — Z79899 Other long term (current) drug therapy: Secondary | ICD-10-CM | POA: Diagnosis not present

## 2021-11-16 DIAGNOSIS — Z8673 Personal history of transient ischemic attack (TIA), and cerebral infarction without residual deficits: Secondary | ICD-10-CM | POA: Diagnosis not present

## 2021-11-16 DIAGNOSIS — I2510 Atherosclerotic heart disease of native coronary artery without angina pectoris: Secondary | ICD-10-CM | POA: Diagnosis not present

## 2021-11-16 DIAGNOSIS — Z955 Presence of coronary angioplasty implant and graft: Secondary | ICD-10-CM | POA: Insufficient documentation

## 2021-11-16 DIAGNOSIS — Z7902 Long term (current) use of antithrombotics/antiplatelets: Secondary | ICD-10-CM | POA: Insufficient documentation

## 2021-11-16 DIAGNOSIS — I130 Hypertensive heart and chronic kidney disease with heart failure and stage 1 through stage 4 chronic kidney disease, or unspecified chronic kidney disease: Secondary | ICD-10-CM | POA: Insufficient documentation

## 2021-11-16 DIAGNOSIS — G4733 Obstructive sleep apnea (adult) (pediatric): Secondary | ICD-10-CM | POA: Diagnosis not present

## 2021-11-16 DIAGNOSIS — I252 Old myocardial infarction: Secondary | ICD-10-CM | POA: Insufficient documentation

## 2021-11-16 DIAGNOSIS — I5022 Chronic systolic (congestive) heart failure: Secondary | ICD-10-CM | POA: Diagnosis present

## 2021-11-16 DIAGNOSIS — Z7901 Long term (current) use of anticoagulants: Secondary | ICD-10-CM | POA: Insufficient documentation

## 2021-11-16 DIAGNOSIS — E1122 Type 2 diabetes mellitus with diabetic chronic kidney disease: Secondary | ICD-10-CM | POA: Diagnosis not present

## 2021-11-16 DIAGNOSIS — I5042 Chronic combined systolic (congestive) and diastolic (congestive) heart failure: Secondary | ICD-10-CM

## 2021-11-16 LAB — BASIC METABOLIC PANEL
Anion gap: 7 (ref 5–15)
BUN: 33 mg/dL — ABNORMAL HIGH (ref 8–23)
CO2: 28 mmol/L (ref 22–32)
Calcium: 9.5 mg/dL (ref 8.9–10.3)
Chloride: 105 mmol/L (ref 98–111)
Creatinine, Ser: 2.28 mg/dL — ABNORMAL HIGH (ref 0.61–1.24)
GFR, Estimated: 30 mL/min — ABNORMAL LOW (ref >60)
Glucose, Bld: 120 mg/dL — ABNORMAL HIGH (ref 70–99)
Potassium: 4.2 mmol/L (ref 3.5–5.1)
Sodium: 140 mmol/L (ref 135–145)

## 2021-11-16 LAB — CBC
HCT: 31.6 % — ABNORMAL LOW (ref 39.0–52.0)
Hemoglobin: 9.8 g/dL — ABNORMAL LOW (ref 13.0–17.0)
MCH: 25.1 pg — ABNORMAL LOW (ref 26.0–34.0)
MCHC: 31 g/dL (ref 30.0–36.0)
MCV: 81 fL (ref 80.0–100.0)
Platelets: 236 10*3/uL (ref 150–400)
RBC: 3.9 MIL/uL — ABNORMAL LOW (ref 4.22–5.81)
RDW: 13.8 % (ref 11.5–15.5)
WBC: 6.5 10*3/uL (ref 4.0–10.5)
nRBC: 0 % (ref 0.0–0.2)

## 2021-11-16 LAB — BRAIN NATRIURETIC PEPTIDE: B Natriuretic Peptide: 54.7 pg/mL (ref 0.0–100.0)

## 2021-11-16 NOTE — Progress Notes (Signed)
? ? ?Advanced Heart Failure Clinic Note ? ? ?PCP: Joni Reining ?HF MD: Dr Haroldine Laws ? ?HPI: ?Eddie [name] is a 71 y.o. male  with h/o DM2, CAD s/p NSTEMI with LAD stent in 3/17, CVA, chronic systolic HF with previous EF 15-20%.  ? ?Had multiple admissions in May and June 2017 in the setting of decompensated CHF and EColi bacteremia. Readmitted in June 2017 with acute respiratory failure requiring intubation in setting of severe HTN and ADHF. Treated with milrinone which was eventually weaned off.    ? ?EF has increased about 10% per year, doing well.   ? ?Today he returns for HF follow up with his wife.  Feels good. Walking about 2 miles per day. Takes his time and has to take some breaks. Mild DOE. No CP, edema, orthopnea or PND. Using CPAP regularly. No problems with medicines. No dizziness.  ? ?ECHO 08/2020 EF 50-55% ?Echo 05/2019 EF 40-45%  ?Echo 12/19 EF 30-35%  ?Echo 01/11/17: EF 40%.   RV ok.  ?ECHO 06/19/2016: EF 20-25%.Cannot exclude small  laminar apical thrombus. No mobile or protuberant thrombus is  seen. ?Echo 5/15 EF 15-20% ? ?Review of systems complete and found to be negative unless listed in HPI.   ? ?Past Medical History:  ?Diagnosis Date  ? Blindness of left eye   ? CAD (coronary artery disease)   ? a. STEMI 09/2015 w/ DES to Prox LAD  ? CVA (cerebral infarction)   ? a. 09/2015: acute small right frontal lobe infarct  ? Diabetes ([place])   ? Hypertension   ? Ischemic cardiomyopathy   ? OSA (obstructive sleep apnea) 07/06/2017  ? Mild OSA with AHI of 8.2/hr with hypoxemia as low as 76% now on CPAP at 11cm H2O.  ? PUD (peptic ulcer disease)   ? ST elevation myocardial infarction involving left anterior descending (LAD) coronary artery ([place]) 09/28/2015  ? ? ?Current Outpatient Medications  ?Medication Sig Dispense Refill  ? Accu-Chek FastClix Lancets MISC Use to check blood sugar 1 time a day 102 each 3  ? Acetaminophen 325 MG CAPS Take 2 tablets by mouth every 4 (four) hours as needed.    ? albuterol  (VENTOLIN HFA) 108 (90 Base) MCG/ACT inhaler Inhale 2 puffs into the lungs every 6 (six) hours as needed for wheezing or shortness of breath. 8.5 g 2  ? amLODipine (NORVASC) 5 MG tablet Take 1 tablet (5 mg total) by mouth daily. 90 tablet 3  ? aspirin EC 81 MG tablet Take 1 tablet (81 mg total) by mouth daily. 90 tablet 3  ? atorvastatin (LIPITOR) 80 MG tablet Take 1 tablet (80 mg total) by mouth daily. 90 tablet 3  ? bimatoprost (LUMIGAN) 0.03 % ophthalmic solution Place 1 drop into the right eye at bedtime.    ? Blood Glucose Monitoring Suppl (ACCU-CHEK GUIDE) w/Device KIT 1 kit by Does not apply route daily. 1 kit 0  ? brimonidine (ALPHAGAN) 0.2 % ophthalmic solution Apply to eye.    ? carvedilol (COREG) 25 MG tablet Take 1 tablet (25 mg total) by mouth 2 (two) times daily. 180 tablet 3  ? clopidogrel (PLAVIX) 75 MG tablet Take 1 tablet (75 mg total) by mouth daily. 90 tablet 3  ? dorzolamide-timolol (COSOPT) 22.3-6.8 MG/ML ophthalmic solution Place 1 drop into both eyes 2 (two) times daily. 15 mL 12  ? empagliflozin (JARDIANCE) 10 MG TABS tablet Take 1 tablet (10 mg total) by mouth daily before breakfast. 90 tablet 3  ?  eplerenone (INSPRA) 25 MG tablet Take 0.5 tablets (12.5 mg total) by mouth daily. 45 tablet 3  ? furosemide (LASIX) 40 MG tablet Take 1 tablet (40 mg total) by mouth as needed. Take 1 tablet (40 mg total) by mouth as needed (for wt gain of 3 lbs in a day or 5 lbs in a week). 30 tablet 3  ? glucose blood (ACCU-CHEK GUIDE) test strip USE TO CHECK BLOOD SUGAR ONCE DAILY 100 strip 12  ? Insulin Pen Needle (UNIFINE PENTIPS) 31G X 5 MM MISC Use daily as directed 100 each 5  ? Insulin Pen Needle 31G X 5 MM MISC USE AS DIRECTED TO INJECT INSULIN DAILY 100 each 5  ? ivabradine (CORLANOR) 5 MG TABS tablet Take 1 tablet (5 mg total) by mouth 2 (two) times daily with a meal. 180 tablet 5  ? Lancets Misc. (ACCU-CHEK FASTCLIX LANCET) KIT Use as directed once daily 1 kit 1  ? latanoprost (XALATAN) 0.005 %  ophthalmic solution Place 1 drop into both eyes every evening. 7.5 mL 12  ? liraglutide (VICTOZA) 18 MG/3ML SOPN Inject 0.6 mg into the skin daily. 9 mL 3  ? metFORMIN (GLUCOPHAGE) 1000 MG tablet Take 1 tablet (1,000 mg total) by mouth 2 (two) times daily with a meal. 180 tablet 3  ? nitroGLYCERIN (NITROSTAT) 0.4 MG SL tablet Use one pill every 5 minutes X 3 if needed for chest pain. Contact MD if pain does not improve 30 tablet 0  ? sacubitril-valsartan (ENTRESTO) 49-51 MG Take 1 tablet by mouth 2 (two) times daily. 60 tablet 6  ? vitamin B-12 (CYANOCOBALAMIN) 1000 MCG tablet Take 1 tablet (1,000 mcg total) by mouth daily. 90 tablet 1  ? ?No current facility-administered medications for this encounter.  ? ? ?Allergies  ?Allergen Reactions  ? Bidil [Isosorb Dinitrate-Hydralazine] Other (See Comments)  ?  Patient became lightheaded with 1 tablet tid.   ? ? ?  ?Social History  ? ?Socioeconomic History  ? Marital status: Married  ?  Spouse name: Not on file  ? Number of children: Not on file  ? Years of education: Not on file  ? Highest education level: Not on file  ?Occupational History  ? Occupation: Physiological scientist  ?  Employer: [employer]  ?Tobacco Use  ? Smoking status: Some Days  ?  Packs/day: 0.20  ?  Types: Cigarettes  ? Smokeless tobacco: Never  ?Vaping Use  ? Vaping Use: Never used  ?Substance and Sexual Activity  ? Alcohol use: No  ?  Alcohol/week: 0.0 standard drinks  ? Drug use: No  ? Sexual activity: Not on file  ?Other Topics Concern  ? Not on file  ?Social History Narrative  ? Not on file  ? ?Social Determinants of Health  ? ?Financial Resource Strain: Not on file  ?Food Insecurity: Not on file  ?Transportation Needs: Not on file  ?Physical Activity: Not on file  ?Stress: Not on file  ?Social Connections: Not on file  ?Intimate Partner Violence: Not on file  ? ? ?  ?Family History  ?Problem Relation Age of Onset  ? Diabetes Mother   ? Cancer Neg Hx   ? ? ?Vitals:  ? 11/16/21 0955  ?BP: 116/68   ?Pulse: 78  ?SpO2: 99%  ?Weight: 64 kg (141 lb)  ? ?Wt Readings from Last 3 Encounters:  ?11/16/21 64 kg (141 lb)  ?08/25/21 67.1 kg (148 lb)  ?08/25/21 67.4 kg (148 lb 9.6 oz)  ?  ? ?  PHYSICAL EXAM: ?General:  Thin elderly. No resp difficulty ?HEENT: normal ?Neck: supple. no JVD. Carotids 2+ bilat; no bruits. No lymphadenopathy or thryomegaly appreciated. ?Cor: PMI nondisplaced. Regular rate & rhythm. No rubs, gallops or murmurs. ?Lungs: clear but decreased throughout ?Abdomen: soft, nontender, nondistended. No hepatosplenomegaly. No bruits or masses. Good bowel sounds. ?Extremities: no cyanosis, clubbing, rash, edema ?Neuro: alert & orientedx3, cranial nerves grossly intact. moves all 4 extremities w/o difficulty. Affect pleasant ? ? ?ASSESSMENT & PLAN: ?1. Chronic systolic HF with recovered EF : ICM May 2017 EF 15-20% and 12/17 EF 25-30%. Echo 12/2016 EF improved 40% ?- Echo 12/19 EF 30-35%. ?- Echo 05/2019 EF 40-45% ?- ECHO 08/2020 EF 50-55% ?- Stable NYHA II-III ?- Volume status ok. Lasix decreased after last visit ?- Continue Entresto 97/103 mg BID.  ?- Continue Coreg 25 mg BID ?- Continue inspra 25 mg daily ?- Intolerant to Bidil in the past with dizziness and headaches.  ?- Continue Corlanor to 5 bid.  ?- Continue jardiance 10 mg daily.  ?- Check labs today.  ?- Repeat echo scheduled ?  ?2. CAD: s/p DES to proximal LAD in 09/2015  ?- No angina ?- Continue ASA, Plavix and atorvastatin.  ?- LDL at goal 56 (2/23) ? ?3. HTN ?- Blood pressure runs on low side. Will follow ? ?4. CVA, presumed cardio-embolic ?- Coumadin stopped for non compliance.  ?- Continue DAPT  ? ?5. DMII ?- Per PCP. ?- Continue jardiance 10 mg daily ?- No change ? ?6. OSA  ?- Continue CPAP ? ?7.  Tobacco use ?- Discussed smoking cessation ? ?8. CKD Stage IIIb ?- baseline probably aroun 1.8-2.2 ?- lasix decreased at last visit and most recent Scr 1.53 ?- Repeat labs today ? ?Glori Bickers, MD  ?10:36 AM ? ? ?

## 2021-11-16 NOTE — Addendum Note (Signed)
Encounter addended by: Noralee Space, RN on: 11/16/2021 10:45 AM ? Actions taken: Order list changed, Diagnosis association updated, Clinical Note Signed, Charge Capture section accepted

## 2021-11-16 NOTE — Patient Instructions (Signed)
Labs done today, we will call you for abnormal results ? ?Your physician recommends that you schedule a follow-up appointment in: 1 year, **PLEASE CALL OUR OFFICE IN February 2024 TO SCHEDULE THIS APPOINTMENT ? ?If you have any questions or concerns before your next appointment please send [place] a message through [place] or call our office at [phone].   ? ?TO LEAVE A MESSAGE FOR THE NURSE SELECT OPTION 2, PLEASE LEAVE A MESSAGE INCLUDING: ?YOUR NAME ?DATE OF BIRTH ?CALL BACK NUMBER ?REASON FOR CALL**this is important as we prioritize the call backs ? ?YOU WILL RECEIVE A CALL BACK THE SAME DAY AS LONG AS YOU CALL BEFORE 4:00 PM ? ? ?

## 2021-11-18 ENCOUNTER — Ambulatory Visit (HOSPITAL_COMMUNITY)
Admission: RE | Admit: 2021-11-18 | Discharge: 2021-11-18 | Disposition: A | Discharge status: Home / Self Care | Payer: Medicare Other | Source: Ambulatory Visit | Attending: Internal Medicine | Admitting: Internal Medicine

## 2021-11-18 DIAGNOSIS — J439 Emphysema, unspecified: Secondary | ICD-10-CM | POA: Insufficient documentation

## 2021-11-18 DIAGNOSIS — I2510 Atherosclerotic heart disease of native coronary artery without angina pectoris: Secondary | ICD-10-CM | POA: Diagnosis not present

## 2021-11-18 DIAGNOSIS — I700 Atherosclerosis of aorta: Secondary | ICD-10-CM | POA: Diagnosis not present

## 2021-11-18 DIAGNOSIS — Z720 Tobacco use: Secondary | ICD-10-CM

## 2021-11-18 DIAGNOSIS — F17210 Nicotine dependence, cigarettes, uncomplicated: Secondary | ICD-10-CM | POA: Diagnosis not present

## 2021-11-18 DIAGNOSIS — Z122 Encounter for screening for malignant neoplasm of respiratory organs: Secondary | ICD-10-CM | POA: Insufficient documentation

## 2021-11-21 ENCOUNTER — Other Ambulatory Visit (INDEPENDENT_AMBULATORY_CARE_PROVIDER_SITE_OTHER): Payer: Medicare Other | Admitting: Podiatry

## 2021-11-21 ENCOUNTER — Other Ambulatory Visit (HOSPITAL_COMMUNITY): Payer: Self-pay

## 2021-11-21 DIAGNOSIS — M2041 Other hammer toe(s) (acquired), right foot: Secondary | ICD-10-CM

## 2021-11-21 DIAGNOSIS — M2042 Other hammer toe(s) (acquired), left foot: Secondary | ICD-10-CM

## 2021-11-21 DIAGNOSIS — E1140 Type 2 diabetes mellitus with diabetic neuropathy, unspecified: Secondary | ICD-10-CM

## 2021-11-21 DIAGNOSIS — Z794 Long term (current) use of insulin: Secondary | ICD-10-CM

## 2021-11-21 DIAGNOSIS — I739 Peripheral vascular disease, unspecified: Secondary | ICD-10-CM

## 2021-11-21 NOTE — Progress Notes (Signed)
?Subjective:  ?Patient ID: Eddie [name], male    DOB: 1951-05-23,  MRN: [id number] ? ?Eddie [name] presents to clinic today for at risk foot care. Pt has h/o NIDDM with PAD and painful thick toenails that are difficult to trim. Pain interferes with ambulation. Aggravating factors include wearing enclosed shoe gear. Pain is relieved with periodic professional debridement. ? ?Patient states blood glucose was 90 mg/dl today.  Last known HgA1c is unknown. ? ?New problem(s): None.  ? ?Eddie [name] did see Dr. Edilia Bo at [facility] in February. No intervention planned and he is to follow up 9 months from the February 2023 visit. ? ?PCP is Eddie Rung, DO , and last visit was May 12, 2021. ? ?Allergies  ?Allergen Reactions  ? Bidil [Isosorb Dinitrate-Hydralazine] Other (See Comments)  ?  Patient became lightheaded with 1 tablet tid.   ? ? ?Review of Systems: Negative except as noted in the HPI. ? ?Objective: No changes noted in today's physical examination. ?There were no vitals filed for this visit. ? ?Eddie [name] is a pleasant 71 y.o. male in NAD. AAO X 3. ? ?Vascular Examination: ?CFT <5 seconds b/l LE. Diminished DP/ PT pulse(s) b/l LE. Pedal hair sparse. No pain with calf compression b/l. No ischemia or gangrene noted b/l LE. Skin temperature gradient warm to cool b/l. No cyanosis or clubbing noted b/l LE. ? ?Dermatological Examination: ?Pedal integument with normal turgor, texture and tone b/l LE. No open wounds b/l. No interdigital macerations b/l. Toenails 1-5 b/l elongated, thickened, discolored with subungual debris. +Tenderness with dorsal palpation of nailplates. No hyperkeratotic or porokeratotic lesions present. ? ?Musculoskeletal Examination: ?Muscle strength 5/5 to all lower extremity muscle groups bilaterally. No pain, crepitus or joint limitation noted with ROM bilateral LE. Hammertoe deformity noted 2-5 b/l. Utilizes cane for ambulation assistance. ? ?Neurological Examination: ?Protective sensation  decreased with 10 gram monofilament b/l. ? ? ?  Latest Ref Rng & Units 08/25/2021  ? 10:06 AM 05/12/2021  ? 10:22 AM 01/13/2021  ?  8:44 AM  ?Hemoglobin A1C  ?Hemoglobin-A1c 4.0 - 5.6 % 5.6   5.6   5.9    ? LOWER EXTREMITY DOPPLER STUDY  ? ?Patient Name:  Eddie [name]  Date of Exam:   08/22/2021  ?Medical Rec #: [id number]      Accession #:    [id number]  ?Date of Birth: 05-29-1951      Patient Gender: M  ?Patient Age:   47 years  ?Exam Location:  [facility]  ?Procedure:      VAS [place] ABI WITH/WO TBI  ?Referring Phys: Geralynn Rile  ? ? ?---------------------------------------------------------------------------  ?-----  ?   ?Indications: Peripheral artery disease.  ? ?High Risk Factors: Diabetes, current smoker.  ? ? ? Performing Technologist: Thereasa Parkin RVT  ? ?   ?Examination Guidelines: A complete evaluation includes at minimum, Doppler  ?waveform signals and systolic blood pressure reading at the level of  ?bilateral  ?brachial, anterior tibial, and posterior tibial arteries, when vessel  ?segments  ?are accessible. Bilateral testing is considered an integral part of a  ?complete  ?examination. Photoelectric Plethysmograph (PPG) waveforms and toe systolic  ?pressure readings are included as required and additional duplex testing  ?as  ?needed. Limited examinations for reoccurring indications may be performed  ?as  ?noted.  ? ?   ?ABI Findings:  ?+---------+------------------+-----+----------+--------+  ?Right    Rt Pressure (mmHg)IndexWaveform  Comment   ?+---------+------------------+-----+----------+--------+  ?Brachial 113                                        ?+---------+------------------+-----+----------+--------+  ?  PTA      123               1.09 monophasic          ?+---------+------------------+-----+----------+--------+  ?DP       255               2.26 monophasic          ?+---------+------------------+-----+----------+--------+  ?Great Toe27                 0.24                     ?+---------+------------------+-----+----------+--------+  ? ?+---------+------------------+-----+----------+-------+  ?Left     Lt Pressure (mmHg)IndexWaveform  Comment  ?+---------+------------------+-----+----------+-------+  ?Brachial 113                                       ?+---------+------------------+-----+----------+-------+  ?PTA      255               2.26 monophasic         ?+---------+------------------+-----+----------+-------+  ?DP       255               2.26 monophasic         ?+---------+------------------+-----+----------+-------+  ?Great Toe31                0.27                    ?+---------+------------------+-----+----------+-------+  ? ?+-------+-----------+-----------+------------+------------+  ?ABI/TBIToday's ABIToday's TBIPrevious ABIPrevious TBI  ?+-------+-----------+-----------+------------+------------+  ?Right  [place]         0.24                                 ?+-------+-----------+-----------+------------+------------+  ?Left   [place]         0.27                                 ?+-------+-----------+-----------+------------+------------+  ? ?  No previous ABI.  ?   ?Summary:  ?Right: Resting right ankle-brachial index indicates noncompressible right  ?lower extremity arteries. The right toe-brachial index is abnormal.  ? ?Left: Resting left ankle-brachial index indicates noncompressible left  ?lower extremity arteries. The left toe-brachial index is abnormal.  ? ? ?Assessment/Plan: ?1. Pain due to onychomycosis of toenails of both feet   ?2. Abnormal ankle brachial index (ABI)   ?3. Encounter for smoking cessation counseling   ?4. PAD (peripheral artery disease) (HCC)   ?5. Diabetic peripheral neuropathy associated with type 2 diabetes mellitus (HCC)   ?  ? ?-Patient was evaluated and treated. All patient's and/or POA's questions/concerns answered on today's visit. ?-I discussed the findings of his ABIs,  more specifically, his TBIs indicating severe microvascular disease. I recommended he strongly consider smoking cessation.. ?-Mycotic toenails 1-5 bilaterally were debrided in length and girth with sterile nail nippers and dremel without incident. ?-Patient/POA to call should there be question/concern in the interim.  ? ?Return in about 3 months (around 02/14/2022). ? ?Freddie Breech, DPM  ?

## 2021-11-21 NOTE — Progress Notes (Signed)
Diabetic shoe order entered today. ?

## 2021-11-25 ENCOUNTER — Other Ambulatory Visit (HOSPITAL_COMMUNITY): Payer: Self-pay

## 2021-12-08 ENCOUNTER — Other Ambulatory Visit (HOSPITAL_COMMUNITY): Payer: Self-pay

## 2021-12-09 ENCOUNTER — Telehealth: Payer: Self-pay

## 2021-12-09 NOTE — Telephone Encounter (Signed)
CMN Received - shoes ordered and casts released  Apex B3100M 10W

## 2021-12-18 ENCOUNTER — Other Ambulatory Visit (HOSPITAL_COMMUNITY): Payer: Self-pay | Admitting: Internal Medicine

## 2021-12-18 ENCOUNTER — Other Ambulatory Visit: Payer: Self-pay

## 2021-12-20 ENCOUNTER — Other Ambulatory Visit (HOSPITAL_COMMUNITY): Payer: Self-pay

## 2021-12-20 MED ORDER — EMPAGLIFLOZIN 10 MG PO TABS
10.0000 mg | ORAL_TABLET | Freq: Every day | ORAL | 3 refills | Status: DC
  Filled 2021-12-20: qty 90, 90d supply, fill #0
  Filled 2022-03-29: qty 90, 90d supply, fill #1
  Filled 2022-06-25: qty 90, 90d supply, fill #2
  Filled 2022-09-17: qty 90, 90d supply, fill #3

## 2021-12-21 ENCOUNTER — Other Ambulatory Visit (HOSPITAL_COMMUNITY): Payer: Self-pay

## 2021-12-21 ENCOUNTER — Other Ambulatory Visit: Payer: Self-pay | Admitting: Internal Medicine

## 2021-12-21 ENCOUNTER — Other Ambulatory Visit: Payer: Self-pay

## 2021-12-21 MED ORDER — METFORMIN HCL 1000 MG PO TABS
1000.0000 mg | ORAL_TABLET | Freq: Two times a day (BID) | ORAL | 3 refills | Status: DC
  Filled 2021-12-21: qty 180, 90d supply, fill #0

## 2021-12-21 NOTE — Telephone Encounter (Signed)
Next appt scheduled 01/12/22 with PCP.

## 2021-12-21 NOTE — Telephone Encounter (Signed)
Has an appointment 01/12/2022

## 2021-12-21 NOTE — Telephone Encounter (Signed)
metFORMIN (GLUCOPHAGE) 1000 MG tablet, REFILL REQUEST @ [facility].

## 2021-12-22 ENCOUNTER — Other Ambulatory Visit (HOSPITAL_COMMUNITY): Payer: Self-pay

## 2021-12-26 ENCOUNTER — Other Ambulatory Visit (HOSPITAL_COMMUNITY): Payer: Self-pay

## 2021-12-27 ENCOUNTER — Other Ambulatory Visit (HOSPITAL_COMMUNITY): Payer: Self-pay

## 2021-12-28 ENCOUNTER — Other Ambulatory Visit (HOSPITAL_COMMUNITY): Payer: Self-pay

## 2021-12-29 ENCOUNTER — Telehealth: Payer: Self-pay

## 2021-12-29 NOTE — Telephone Encounter (Signed)
Shoes and insoles ready, left vm for patient to schedule

## 2021-12-30 ENCOUNTER — Ambulatory Visit (INDEPENDENT_AMBULATORY_CARE_PROVIDER_SITE_OTHER): Payer: Medicare Other

## 2021-12-30 DIAGNOSIS — M2042 Other hammer toe(s) (acquired), left foot: Secondary | ICD-10-CM

## 2021-12-30 DIAGNOSIS — M2041 Other hammer toe(s) (acquired), right foot: Secondary | ICD-10-CM

## 2021-12-30 DIAGNOSIS — E1140 Type 2 diabetes mellitus with diabetic neuropathy, unspecified: Secondary | ICD-10-CM

## 2021-12-30 NOTE — Progress Notes (Signed)
SITUATION Reason for Visit: Fitting of Diabetic Shoes & Insoles Patient / Caregiver Report:  Patient is satisfied with fit and function of shoes and insoles.  OBJECTIVE DATA: Patient History / Diagnosis:     ICD-10-CM   1. Type 2 diabetes mellitus with diabetic neuropathy, with long-term current use of insulin (HCC)  E11.40    Z79.4     2. Acquired hammertoes of both feet  M20.41    M20.42       Change in Status:   None  ACTIONS PERFORMED: In-Person Delivery, patient was fit with: - 1x pair A5500 PDAC approved prefabricated Diabetic Shoes: Apex B3100M 10W - 3x pair [id number] PDAC approved vacuum formed custom diabetic insoles; [employer]: [id number]  Shoes and insoles were verified for structural integrity and safety. Patient wore shoes and insoles in office. Skin was inspected and free of areas of concern after wearing shoes and inserts. Shoes and inserts fit properly. Patient / Caregiver provided with ferbal instruction and demonstration regarding donning, doffing, wear, care, proper fit, function, purpose, cleaning, and use of shoes and insoles ' and in all related precautions and risks and benefits regarding shoes and insoles. Patient / Caregiver was instructed to wear properly fitting socks with shoes at all times. Patient was also provided with verbal instruction regarding how to report any failures or malfunctions of shoes or inserts, and necessary follow up care. Patient / Caregiver was also instructed to contact physician regarding change in status that may affect function of shoes and inserts.   Patient / Caregiver verbalized undersatnding of instruction provided. Patient / Caregiver demonstrated independence with proper donning and doffing of shoes and inserts.  PLAN Patient to follow with treating physician as recommended. Plan of care was discussed with and agreed upon by patient and/or caregiver. All questions were answered and concerns addressed.

## 2022-01-09 ENCOUNTER — Other Ambulatory Visit (HOSPITAL_COMMUNITY): Payer: Self-pay

## 2022-01-12 ENCOUNTER — Encounter: Payer: Self-pay | Admitting: Internal Medicine

## 2022-01-12 ENCOUNTER — Other Ambulatory Visit: Payer: Self-pay

## 2022-01-12 ENCOUNTER — Ambulatory Visit (INDEPENDENT_AMBULATORY_CARE_PROVIDER_SITE_OTHER): Payer: Medicare Other | Admitting: Internal Medicine

## 2022-01-12 ENCOUNTER — Encounter: Payer: Self-pay | Admitting: Dietician

## 2022-01-12 ENCOUNTER — Other Ambulatory Visit (HOSPITAL_COMMUNITY): Payer: Self-pay

## 2022-01-12 VITALS — BP 120/64 | HR 79 | Temp 98.3°F | Ht 66.0 in | Wt 138.7 lb

## 2022-01-12 DIAGNOSIS — I739 Peripheral vascular disease, unspecified: Secondary | ICD-10-CM

## 2022-01-12 DIAGNOSIS — E1151 Type 2 diabetes mellitus with diabetic peripheral angiopathy without gangrene: Secondary | ICD-10-CM

## 2022-01-12 DIAGNOSIS — Z23 Encounter for immunization: Secondary | ICD-10-CM

## 2022-01-12 DIAGNOSIS — Z7984 Long term (current) use of oral hypoglycemic drugs: Secondary | ICD-10-CM

## 2022-01-12 DIAGNOSIS — N1832 Chronic kidney disease, stage 3b: Secondary | ICD-10-CM

## 2022-01-12 DIAGNOSIS — E1122 Type 2 diabetes mellitus with diabetic chronic kidney disease: Secondary | ICD-10-CM

## 2022-01-12 DIAGNOSIS — J439 Emphysema, unspecified: Secondary | ICD-10-CM

## 2022-01-12 DIAGNOSIS — E1140 Type 2 diabetes mellitus with diabetic neuropathy, unspecified: Secondary | ICD-10-CM

## 2022-01-12 DIAGNOSIS — I5042 Chronic combined systolic (congestive) and diastolic (congestive) heart failure: Secondary | ICD-10-CM

## 2022-01-12 DIAGNOSIS — F17210 Nicotine dependence, cigarettes, uncomplicated: Secondary | ICD-10-CM

## 2022-01-12 LAB — POCT GLYCOSYLATED HEMOGLOBIN (HGB A1C): Hemoglobin A1C: 5.3 % (ref 4.0–5.6)

## 2022-01-12 LAB — GLUCOSE, CAPILLARY: Glucose-Capillary: 104 mg/dL — ABNORMAL HIGH (ref 70–99)

## 2022-01-12 MED ORDER — METFORMIN HCL 500 MG PO TABS
500.0000 mg | ORAL_TABLET | Freq: Two times a day (BID) | ORAL | 3 refills | Status: DC
Start: 2022-01-12 — End: 2023-02-26
  Filled 2022-01-12: qty 180, 90d supply, fill #0
  Filled 2022-08-27: qty 180, 90d supply, fill #1
  Filled 2022-11-25 – 2022-11-27 (×2): qty 180, 90d supply, fill #2

## 2022-01-12 NOTE — Assessment & Plan Note (Signed)
CKD stage IIIb.  He is on an ARB as well as SGLT2 inhibitor.  We will check a urine microalbumin as well as repeat BMP today.

## 2022-01-12 NOTE — Assessment & Plan Note (Signed)
Appears euvolemic on exam today following with Dr. Gala Romney.  No changes we will continue Jardiance even though his A1c is excellent for the additional CHF/CKD benefit.

## 2022-01-12 NOTE — Progress Notes (Signed)
Established Patient Office Visit  Subjective   Patient ID: Eddie [name], male    DOB: 1951-05-30  Age: 71 y.o. MRN: [id number]  Chief Complaint  Patient presents with   Follow-up    Overall feeling well. Reports no changes to health.  He saw podiatry and is now wearing his diabetic shoes which she is pleased with.  He also saw Dr. Gala Romney about a month and a half ago and reports he feels he is doing well from a CHF standpoint.  He is taking all of his medications and brings them to this visit.  He has been checking his blood sugars once a day he brings his glucometer for download.  On my review he is done 90 test between 325 and today average glucose is 106 standard deviation is 9.3 highest is 148 lowest is 87 he is doing remarkably well.       Objective:     BP 120/64 (BP Location: Left Arm, Patient Position: Sitting, Cuff Size: Normal)   Pulse 79   Temp 98.3 F (36.8 C) (Oral)   Ht 5\' 6"  (1.676 m)   Wt 138 lb 11.2 oz (62.9 kg)   SpO2 98% Comment: RA  BMI 22.39 kg/m  BP Readings from Last 3 Encounters:  01/12/22 120/64  11/16/21 116/68  08/25/21 (!) 152/78   Wt Readings from Last 3 Encounters:  01/12/22 138 lb 11.2 oz (62.9 kg)  11/16/21 141 lb (64 kg)  08/25/21 148 lb (67.1 kg)      Physical Exam Vitals and nursing note reviewed.  Constitutional:      Appearance: Normal appearance.  Cardiovascular:     Rate and Rhythm: Normal rate and regular rhythm.     Pulses: Normal pulses.     Heart sounds: Normal heart sounds.  Pulmonary:     Effort: Pulmonary effort is normal.     Breath sounds: Normal breath sounds. No wheezing.  Musculoskeletal:     Right lower leg: No edema.     Left lower leg: No edema.  Neurological:     Mental Status: He is alert.  Psychiatric:        Mood and Affect: Mood normal.      Results for orders placed or performed in visit on 01/12/22  HM DIABETES EYE EXAM  Result Value Ref Range   HM Diabetic Eye Exam No Retinopathy No  Retinopathy  Results for orders placed or performed in visit on 01/12/22  Glucose, capillary  Result Value Ref Range   Glucose-Capillary 104 (H) 70 - 99 mg/dL  POC Hbg 01/14/22  Result Value Ref Range   Hemoglobin A1C 5.3 4.0 - 5.6 %   HbA1c POC (<> result, manual entry)     HbA1c, POC (prediabetic range)     HbA1c, POC (controlled diabetic range)      Last hemoglobin A1c Lab Results  Component Value Date   HGBA1C 5.3 01/12/2022      The ASCVD Risk score (Arnett DK, et al., 2019) failed to calculate for the following reasons:   The valid total cholesterol range is 130 to 320 mg/dL    Assessment & Plan:   Problem List Items Addressed This Visit       Cardiovascular and Mediastinum   Chronic combined systolic and diastolic heart failure (HCC) (Chronic)    Appears euvolemic on exam today following with Dr. 2020.  No changes we will continue Jardiance even though his A1c is excellent for the additional CHF/CKD benefit.  PAD (peripheral artery disease) (HCC)    He saw vascular surgery earlier this year he does not have any foot wounds at this time there is a plan for repeat ABIs later this year.        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  On exam no wheezing respiratory rate appears slightly increased but O2 saturation is stable.  No changes.        Endocrine   Controlled type 2 diabetes mellitus with diabetic neuropathy, without long-term current use of insulin (HCC) - Primary (Chronic)    A1c returned at 5.3% I congratulated him on this.  I discussed that we really should decrease some of his medication burden especially with his CKD stage IIIb I will go ahead and decrease his metformin to 500 mg twice daily.  He is also on 0.6 mg daily of Victoza however this does provide a cardiovascular benefit as well as Jardiance 10 mg which does have cardiovascular as well as CHF benefit.       Relevant Medications   metFORMIN (GLUCOPHAGE) 500 MG tablet   Other Relevant Orders   POC Hbg A1C (Completed)   Microalbumin / Creatinine Urine Ratio     Genitourinary   CKD (chronic kidney disease) stage 3, GFR 30-59 ml/min (HCC) (Chronic)    CKD stage IIIb.  He is on an ARB as well as SGLT2 inhibitor.  We will check a urine microalbumin as well as repeat BMP today.      Relevant Orders   BMP8+Anion Gap   Magnesium   Other Visit Diagnoses     Need for prophylactic vaccination against diphtheria-tetanus-pertussis (DTP)       Relevant Orders   Tdap vaccine greater than or equal to 7yo IM (Completed)       Return in about 6 months (around 07/14/2022).    Gust Rung, DO

## 2022-01-12 NOTE — Assessment & Plan Note (Signed)
Overall feeling well no changes.  On exam no wheezing respiratory rate appears slightly increased but O2 saturation is stable.  No changes.

## 2022-01-12 NOTE — Assessment & Plan Note (Signed)
He saw vascular surgery earlier this year he does not have any foot wounds at this time there is a plan for repeat ABIs later this year.

## 2022-01-12 NOTE — Assessment & Plan Note (Signed)
A1c returned at 5.3% I congratulated him on this.  I discussed that we really should decrease some of his medication burden especially with his CKD stage IIIb I will go ahead and decrease his metformin to 500 mg twice daily.  He is also on 0.6 mg daily of Victoza however this does provide a cardiovascular benefit as well as Jardiance 10 mg which does have cardiovascular as well as CHF benefit.

## 2022-01-13 LAB — BMP8+ANION GAP
Anion Gap: 16 mmol/L (ref 10.0–18.0)
BUN/Creatinine Ratio: 15 (ref 10–24)
BUN: 25 mg/dL (ref 8–27)
CO2: 22 mmol/L (ref 20–29)
Calcium: 9.1 mg/dL (ref 8.6–10.2)
Chloride: 105 mmol/L (ref 96–106)
Creatinine, Ser: 1.68 mg/dL — ABNORMAL HIGH (ref 0.76–1.27)
Glucose: 112 mg/dL — ABNORMAL HIGH (ref 70–99)
Potassium: 4.2 mmol/L (ref 3.5–5.2)
Sodium: 143 mmol/L (ref 134–144)
eGFR: 43 mL/min/{1.73_m2} — ABNORMAL LOW (ref >59)

## 2022-01-13 LAB — MAGNESIUM: Magnesium: 2 mg/dL (ref 1.6–2.3)

## 2022-01-13 LAB — MICROALBUMIN / CREATININE URINE RATIO
Creatinine, Urine: 17.1 mg/dL
Microalb/Creat Ratio: 37 mg/g creat — ABNORMAL HIGH (ref 0–29)
Microalbumin, Urine: 6.4 ug/mL

## 2022-01-16 ENCOUNTER — Other Ambulatory Visit (HOSPITAL_COMMUNITY): Payer: Self-pay

## 2022-01-18 ENCOUNTER — Other Ambulatory Visit: Payer: Self-pay | Admitting: Internal Medicine

## 2022-02-02 ENCOUNTER — Other Ambulatory Visit (HOSPITAL_COMMUNITY): Payer: Self-pay

## 2022-02-08 ENCOUNTER — Other Ambulatory Visit (HOSPITAL_COMMUNITY): Payer: Self-pay

## 2022-02-09 ENCOUNTER — Other Ambulatory Visit (HOSPITAL_COMMUNITY): Payer: Self-pay

## 2022-02-13 ENCOUNTER — Other Ambulatory Visit (HOSPITAL_COMMUNITY): Payer: Self-pay

## 2022-02-13 ENCOUNTER — Other Ambulatory Visit: Payer: Self-pay | Admitting: Internal Medicine

## 2022-02-13 DIAGNOSIS — I5042 Chronic combined systolic (congestive) and diastolic (congestive) heart failure: Secondary | ICD-10-CM

## 2022-02-13 DIAGNOSIS — I2510 Atherosclerotic heart disease of native coronary artery without angina pectoris: Secondary | ICD-10-CM

## 2022-02-13 MED ORDER — ATORVASTATIN CALCIUM 80 MG PO TABS
80.0000 mg | ORAL_TABLET | Freq: Every day | ORAL | 3 refills | Status: DC
  Filled 2022-02-13: qty 90, 90d supply, fill #0
  Filled 2022-05-14: qty 90, 90d supply, fill #1
  Filled 2022-08-12: qty 90, 90d supply, fill #2
  Filled 2022-11-07: qty 90, 90d supply, fill #3

## 2022-02-13 MED ORDER — CARVEDILOL 25 MG PO TABS
25.0000 mg | ORAL_TABLET | Freq: Two times a day (BID) | ORAL | 3 refills | Status: DC
  Filled 2022-02-13: qty 180, 90d supply, fill #0
  Filled 2022-05-14: qty 180, 90d supply, fill #1
  Filled 2022-08-12: qty 180, 90d supply, fill #2
  Filled 2022-11-07: qty 180, 90d supply, fill #3

## 2022-02-17 ENCOUNTER — Encounter: Payer: Self-pay | Admitting: Podiatry

## 2022-02-17 ENCOUNTER — Ambulatory Visit (INDEPENDENT_AMBULATORY_CARE_PROVIDER_SITE_OTHER): Payer: Medicare Other | Admitting: Podiatry

## 2022-02-17 DIAGNOSIS — B351 Tinea unguium: Secondary | ICD-10-CM

## 2022-02-17 DIAGNOSIS — E1151 Type 2 diabetes mellitus with diabetic peripheral angiopathy without gangrene: Secondary | ICD-10-CM | POA: Diagnosis not present

## 2022-02-17 DIAGNOSIS — M79674 Pain in right toe(s): Secondary | ICD-10-CM | POA: Diagnosis not present

## 2022-02-17 DIAGNOSIS — M79675 Pain in left toe(s): Secondary | ICD-10-CM

## 2022-02-20 ENCOUNTER — Other Ambulatory Visit: Payer: Self-pay | Admitting: Internal Medicine

## 2022-02-20 ENCOUNTER — Telehealth (HOSPITAL_COMMUNITY): Payer: Self-pay | Admitting: Licensed Clinical Social Worker

## 2022-02-20 ENCOUNTER — Telehealth (HOSPITAL_COMMUNITY): Payer: Self-pay | Admitting: *Deleted

## 2022-02-20 DIAGNOSIS — E1140 Type 2 diabetes mellitus with diabetic neuropathy, unspecified: Secondary | ICD-10-CM

## 2022-02-20 NOTE — Telephone Encounter (Signed)
H&V Care Navigation CSW Progress Note  Clinical Social Worker  received call from pt wife  to discuss current food insecurity.  Pt called to request help with getting food stamps increased.  States that their food stamps have been decreased to $80/month due to increase in income.  She says she spoke with the caseworker today and was told they need medical records sent to [facility] worker in order to have food stamps increased.  CSW called [employer] to verify this information- they actually need records of all medical expenses so they can show what their income goes to.  CSW informed pt wife of this.  Provided with gift cards and food bag to help with current food insecurity.  SDOH Screenings   Alcohol Screen: Not on file  Depression (PHQ2-9): Low Risk  (01/12/2022)   Depression (PHQ2-9)    PHQ-2 Score: 0  Financial Resource Strain: Not on file  Food Insecurity: Food Insecurity Present (02/20/2022)   Hunger Vital Sign    Worried About Running Out of Food in the Last Year: Sometimes true    Ran Out of Food in the Last Year: Sometimes true  Housing: Not on file  Physical Activity: Not on file  Social Connections: Not on file  Stress: Not on file  Tobacco Use: High Risk (02/17/2022)   Patient History    Smoking Tobacco Use: Some Days    Smokeless Tobacco Use: Never    Passive Exposure: Not on file  Transportation Needs: Not on file    Burna Sis, LCSW Clinical Social Worker [facility] Desk#: [phone] Cell#: [phone]

## 2022-02-20 NOTE — Telephone Encounter (Signed)
Records faxed to Sue Lush at [phone] as requested by patients wife.

## 2022-02-21 ENCOUNTER — Other Ambulatory Visit (HOSPITAL_COMMUNITY): Payer: Self-pay

## 2022-02-21 MED ORDER — ACCU-CHEK FASTCLIX LANCETS MISC
3 refills | Status: DC
  Filled 2022-02-21: qty 102, 100d supply, fill #0
  Filled 2022-05-17: qty 102, 100d supply, fill #1
  Filled 2022-09-11: qty 102, 100d supply, fill #2
  Filled 2022-11-07 – 2023-01-13 (×2): qty 102, 100d supply, fill #3

## 2022-02-21 MED ORDER — UNIFINE PENTIPS 31G X 5 MM MISC
1.0000 [IU] | Freq: Every day | 5 refills | Status: AC
  Filled 2022-02-21: qty 100, 100d supply, fill #0
  Filled 2023-01-13: qty 100, 100d supply, fill #1

## 2022-02-21 NOTE — Progress Notes (Signed)
  Subjective:  Patient ID: Eddie [name], male    DOB: May 31, 1951,  MRN: [id number]  Eddie [name] presents to clinic today for at risk foot care. Pt has h/o NIDDM with PAD and painful elongated mycotic toenails 1-5 bilaterally which are tender when wearing enclosed shoe gear. Pain is relieved with periodic professional debridement.  Last A1c was 5.3%.  New problem(s): None.   His wife is present during today's visit.  PCP is Gust Rung, DO , and last visit was  January 12, 2022  Allergies  Allergen Reactions   Bidil [Isosorb Dinitrate-Hydralazine] Other (See Comments)    Patient became lightheaded with 1 tablet tid.     Review of Systems: Negative except as noted in the HPI.  Objective: No changes noted in today's physical examination. Eddie [name] is a pleasant 71 y.o. male in NAD. AAO X 3.  Vascular Examination: CFT <5 seconds b/l LE. Diminished DP/ PT pulse(s) b/l LE. Pedal hair sparse. No pain with calf compression b/l. No ischemia or gangrene noted b/l LE. Skin temperature gradient warm to cool b/l. No cyanosis or clubbing noted b/l LE.  Dermatological Examination: Pedal integument with normal turgor, texture and tone b/l LE. No open wounds b/l. No interdigital macerations b/l. Toenails 1-5 b/l elongated, thickened, discolored with subungual debris. +Tenderness with dorsal palpation of nailplates. No hyperkeratotic or porokeratotic lesions present.  Musculoskeletal Examination: Muscle strength 5/5 to all lower extremity muscle groups bilaterally. No pain, crepitus or joint limitation noted with ROM bilateral LE. Hammertoe deformity noted 2-5 b/l. Utilizes cane for ambulation assistance.  Neurological Examination: Protective sensation decreased with 10 gram monofilament b/l.     Latest Ref Rng & Units 01/12/2022    9:12 AM 08/25/2021   10:06 AM 05/12/2021   10:22 AM  Hemoglobin A1C  Hemoglobin-A1c 4.0 - 5.6 % 5.3  5.6  5.6    Assessment/Plan: 1. Pain due to  onychomycosis of toenails of both feet   2. Type II diabetes mellitus with peripheral circulatory disorder (HCC)     -Examined patient. -Patient to continue soft, supportive shoe gear daily. -Mycotic toenails 1-5 bilaterally were debrided in length and girth with sterile nail nippers and dremel without incident. -Patient/POA to call should there be question/concern in the interim.   Return in about 3 months (around 05/20/2022).  Freddie Breech, DPM

## 2022-02-27 ENCOUNTER — Encounter: Payer: Self-pay | Admitting: Dietician

## 2022-03-13 ENCOUNTER — Other Ambulatory Visit (HOSPITAL_COMMUNITY): Payer: Self-pay

## 2022-03-19 ENCOUNTER — Other Ambulatory Visit (HOSPITAL_COMMUNITY): Payer: Self-pay | Admitting: Internal Medicine

## 2022-03-20 ENCOUNTER — Other Ambulatory Visit (HOSPITAL_COMMUNITY): Payer: Self-pay

## 2022-03-20 MED ORDER — FUROSEMIDE 40 MG PO TABS
40.0000 mg | ORAL_TABLET | ORAL | 3 refills | Status: DC | PRN
  Filled 2022-03-20: qty 30, 30d supply, fill #0
  Filled 2022-04-19: qty 30, 30d supply, fill #1
  Filled 2022-05-14: qty 30, 30d supply, fill #2
  Filled 2022-06-17: qty 30, 30d supply, fill #3

## 2022-03-21 ENCOUNTER — Other Ambulatory Visit (HOSPITAL_COMMUNITY): Payer: Self-pay

## 2022-03-23 ENCOUNTER — Other Ambulatory Visit (HOSPITAL_COMMUNITY): Payer: Self-pay

## 2022-03-24 ENCOUNTER — Other Ambulatory Visit (HOSPITAL_COMMUNITY): Payer: Self-pay

## 2022-03-24 MED ORDER — DORZOLAMIDE HCL-TIMOLOL MAL 2-0.5 % OP SOLN
1.0000 [drp] | Freq: Two times a day (BID) | OPHTHALMIC | 2 refills | Status: DC
  Filled 2022-03-24: qty 10, 50d supply, fill #0

## 2022-03-29 ENCOUNTER — Other Ambulatory Visit (HOSPITAL_COMMUNITY): Payer: Self-pay

## 2022-04-03 ENCOUNTER — Other Ambulatory Visit (HOSPITAL_COMMUNITY): Payer: Self-pay

## 2022-04-12 ENCOUNTER — Other Ambulatory Visit (HOSPITAL_COMMUNITY): Payer: Self-pay

## 2022-04-13 ENCOUNTER — Other Ambulatory Visit (HOSPITAL_COMMUNITY): Payer: Self-pay

## 2022-04-19 ENCOUNTER — Other Ambulatory Visit (HOSPITAL_COMMUNITY): Payer: Self-pay

## 2022-05-04 ENCOUNTER — Other Ambulatory Visit (HOSPITAL_COMMUNITY): Payer: Self-pay

## 2022-05-04 ENCOUNTER — Other Ambulatory Visit (HOSPITAL_COMMUNITY): Payer: Self-pay | Admitting: Family Medicine

## 2022-05-04 MED ORDER — EPLERENONE 25 MG PO TABS
12.5000 mg | ORAL_TABLET | Freq: Every day | ORAL | 0 refills | Status: DC
  Filled 2022-05-04: qty 45, 90d supply, fill #0

## 2022-05-14 ENCOUNTER — Other Ambulatory Visit (HOSPITAL_COMMUNITY): Payer: Self-pay | Admitting: Family Medicine

## 2022-05-15 ENCOUNTER — Other Ambulatory Visit (HOSPITAL_COMMUNITY): Payer: Self-pay

## 2022-05-15 MED ORDER — ENTRESTO 49-51 MG PO TABS
1.0000 | ORAL_TABLET | Freq: Two times a day (BID) | ORAL | 6 refills | Status: DC
  Filled 2022-05-15: qty 60, 30d supply, fill #0
  Filled 2022-06-12: qty 60, 30d supply, fill #1
  Filled 2022-07-14: qty 60, 30d supply, fill #2
  Filled 2022-08-12: qty 60, 30d supply, fill #3
  Filled 2022-09-11: qty 60, 30d supply, fill #4
  Filled 2022-10-12: qty 60, 30d supply, fill #5
  Filled 2022-11-07: qty 60, 30d supply, fill #6

## 2022-05-17 ENCOUNTER — Other Ambulatory Visit (HOSPITAL_COMMUNITY): Payer: Self-pay

## 2022-06-02 ENCOUNTER — Ambulatory Visit (INDEPENDENT_AMBULATORY_CARE_PROVIDER_SITE_OTHER): Payer: Medicare Other | Admitting: Podiatry

## 2022-06-02 DIAGNOSIS — M79674 Pain in right toe(s): Secondary | ICD-10-CM

## 2022-06-02 DIAGNOSIS — E1151 Type 2 diabetes mellitus with diabetic peripheral angiopathy without gangrene: Secondary | ICD-10-CM | POA: Diagnosis not present

## 2022-06-02 DIAGNOSIS — M79675 Pain in left toe(s): Secondary | ICD-10-CM | POA: Diagnosis not present

## 2022-06-02 DIAGNOSIS — B351 Tinea unguium: Secondary | ICD-10-CM

## 2022-06-02 NOTE — Progress Notes (Unsigned)
  Subjective:  Patient ID: Eddie [name], male    DOB: 13-Dec-1950,  MRN: [id number]  Eddie [name] presents to clinic today for {jgcomplaint:23593}  Chief Complaint  Patient presents with   Nail Problem    Thick painful toenails, 3 month follow up   New problem(s): None. {jgcomplaint:23593}  PCP is Gust Rung, DO , and last visit was {Time; dates multiple:15870}.  Allergies  Allergen Reactions   Bidil [Isosorb Dinitrate-Hydralazine] Other (See Comments)    Patient became lightheaded with 1 tablet tid.     Review of Systems: Negative except as noted in the HPI.  Objective: No changes noted in today's physical examination.  Eddie [name] is a pleasant 71 y.o. male WD, WN in NAD. AAO x 3.  Vascular Examination: CFT <5 seconds b/l LE. Diminished DP/ PT pulse(s) b/l LE. Pedal hair sparse. No pain with calf compression b/l. No ischemia or gangrene noted b/l LE. Skin temperature gradient warm to cool b/l. No cyanosis or clubbing noted b/l LE.  Dermatological Examination: Pedal integument with normal turgor, texture and tone b/l LE. No open wounds b/l. No interdigital macerations b/l. Toenails 1-5 b/l elongated, thickened, discolored with subungual debris. +Tenderness with dorsal palpation of nailplates. No hyperkeratotic or porokeratotic lesions present.  Musculoskeletal Examination: Muscle strength 5/5 to all lower extremity muscle groups bilaterally. No pain, crepitus or joint limitation noted with ROM bilateral LE. Hammertoe deformity noted 2-5 b/l. Utilizes cane for ambulation assistance.  Neurological Examination: Protective sensation decreased with 10 gram monofilament b/l.  Assessment/Plan: 1. Pain due to onychomycosis of toenails of both feet   2. Type II diabetes mellitus with peripheral circulatory disorder (HCC)     No orders of the defined types were placed in this encounter.   {Jgplan:23602::"-Patient/POA to call should there be question/concern in the interim."}    No follow-ups on file.  Freddie Breech, DPM

## 2022-06-05 ENCOUNTER — Other Ambulatory Visit (HOSPITAL_COMMUNITY): Payer: Self-pay

## 2022-06-06 ENCOUNTER — Encounter: Payer: Self-pay | Admitting: Podiatry

## 2022-06-13 ENCOUNTER — Other Ambulatory Visit (HOSPITAL_COMMUNITY): Payer: Self-pay

## 2022-06-17 ENCOUNTER — Other Ambulatory Visit: Payer: Self-pay

## 2022-06-19 ENCOUNTER — Other Ambulatory Visit (HOSPITAL_COMMUNITY): Payer: Self-pay

## 2022-06-20 ENCOUNTER — Other Ambulatory Visit (HOSPITAL_COMMUNITY): Payer: Self-pay

## 2022-06-20 ENCOUNTER — Other Ambulatory Visit: Payer: Self-pay | Admitting: Internal Medicine

## 2022-06-20 DIAGNOSIS — I10 Essential (primary) hypertension: Secondary | ICD-10-CM

## 2022-06-20 MED ORDER — AMLODIPINE BESYLATE 5 MG PO TABS
5.0000 mg | ORAL_TABLET | Freq: Every day | ORAL | 3 refills | Status: DC
  Filled 2022-06-20: qty 90, 90d supply, fill #0
  Filled 2022-09-17: qty 90, 90d supply, fill #1
  Filled 2022-12-24: qty 90, 90d supply, fill #2

## 2022-06-20 NOTE — Telephone Encounter (Signed)
amLODipine (NORVASC) 5 MG tablet   [facility] [place]

## 2022-06-26 ENCOUNTER — Other Ambulatory Visit (HOSPITAL_COMMUNITY): Payer: Self-pay

## 2022-06-29 ENCOUNTER — Other Ambulatory Visit: Payer: Self-pay

## 2022-07-13 ENCOUNTER — Other Ambulatory Visit (HOSPITAL_COMMUNITY): Payer: Self-pay

## 2022-07-13 ENCOUNTER — Ambulatory Visit (INDEPENDENT_AMBULATORY_CARE_PROVIDER_SITE_OTHER): Payer: Medicare Other | Admitting: Internal Medicine

## 2022-07-13 ENCOUNTER — Encounter: Payer: Self-pay | Admitting: Internal Medicine

## 2022-07-13 ENCOUNTER — Other Ambulatory Visit: Payer: Self-pay

## 2022-07-13 VITALS — BP 100/63 | HR 76 | Temp 98.1°F | Ht 66.0 in

## 2022-07-13 DIAGNOSIS — Z7984 Long term (current) use of oral hypoglycemic drugs: Secondary | ICD-10-CM

## 2022-07-13 DIAGNOSIS — Z0000 Encounter for general adult medical examination without abnormal findings: Secondary | ICD-10-CM

## 2022-07-13 DIAGNOSIS — I5042 Chronic combined systolic (congestive) and diastolic (congestive) heart failure: Secondary | ICD-10-CM | POA: Diagnosis not present

## 2022-07-13 DIAGNOSIS — F17210 Nicotine dependence, cigarettes, uncomplicated: Secondary | ICD-10-CM | POA: Diagnosis not present

## 2022-07-13 DIAGNOSIS — E1140 Type 2 diabetes mellitus with diabetic neuropathy, unspecified: Secondary | ICD-10-CM | POA: Diagnosis not present

## 2022-07-13 DIAGNOSIS — Z23 Encounter for immunization: Secondary | ICD-10-CM

## 2022-07-13 LAB — POCT GLYCOSYLATED HEMOGLOBIN (HGB A1C): Hemoglobin A1C: 6 % — AB (ref 4.0–5.6)

## 2022-07-13 LAB — GLUCOSE, CAPILLARY: Glucose-Capillary: 113 mg/dL — ABNORMAL HIGH (ref 70–99)

## 2022-07-13 MED ORDER — ACCU-CHEK GUIDE W/DEVICE KIT
1.0000 [IU] | PACK | Freq: Once | 0 refills | Status: DC
  Filled 2022-07-13: qty 1, 1d supply, fill #0

## 2022-07-13 NOTE — Progress Notes (Signed)
Annual Wellness Visit     Patient: Eddie [name], Male    DOB: 1950/12/09, 71 y.o.   MRN: [id number]  Subjective  Chief Complaint  Patient presents with   Follow-up    Diabetes.   Flu Vaccine    Eddie [name] is a 71 y.o. male who presents today for his Annual Wellness Visit. He reports consuming a general diet. Exercise is limited by respiratory condition(s): COPD. He generally feels fairly well. He reports sleeping fairly well. He does have additional problems to discuss today. His glucometer battery died, cannot find a new battery for it.    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, right 03/03/2018   OSA (obstructive sleep apnea) 07/06/2017   Shoulder impingement syndrome, left 04/13/2017   Dyspnea on exertion 12/08/2016   CAD in native artery 10/06/2016   CKD (chronic kidney disease) stage 3, GFR 30-59 ml/min (HCC) 10/06/2016   Health care maintenance 10/06/2016   Esophageal reflux    Chronic combined systolic and diastolic heart failure (HCC)    Gait disturbance, post-stroke 12/09/2015   Tobacco abuse    Essential hypertension    Controlled type 2 diabetes mellitus with diabetic neuropathy, without long-term current use of insulin ([place])    History of CVA (cerebrovascular accident)    Cataract, nuclear sclerotic, left eye 03/15/2012   Pseudophakia of right eye 03/15/2012   Past Medical History:  Diagnosis Date   Blindness of left eye    CAD (coronary artery  disease)    a. STEMI 09/2015 w/ DES to Prox LAD   CVA (cerebral infarction)    a. 09/2015: acute small right frontal lobe infarct   Diabetes ([place])    Hypertension    Ischemic cardiomyopathy    OSA (obstructive sleep apnea) 07/06/2017   Mild OSA with AHI of 8.2/hr with hypoxemia as low as 76% now on CPAP at 11cm H2O.   PUD (peptic ulcer disease)    ST elevation myocardial infarction involving left anterior descending (LAD) coronary artery ([place]) 09/28/2015      Medications: Outpatient Medications Prior to Visit  Medication Sig   Accu-Chek FastClix Lancets MISC Use to check blood sugar 1 time a day   Insulin Pen Needle (UNIFINE PENTIPS) 31G X 5 MM MISC Use daily as directed   Acetaminophen 325 MG CAPS Take 2 tablets by mouth every 4 (four) hours as needed.   albuterol (VENTOLIN HFA) 108 (90 Base) MCG/ACT inhaler Inhale 2 puffs into the lungs every 6 (six) hours as needed for wheezing or shortness of breath.   amLODipine (NORVASC) 5 MG tablet Take 1 tablet (5 mg total) by mouth daily.   aspirin EC 81 MG tablet Take 1 tablet (81 mg total) by mouth daily.   atorvastatin (LIPITOR) 80 MG tablet Take 1 tablet (80 mg total) by mouth daily.   carvedilol (COREG) 25 MG tablet Take 1 tablet (25 mg total) by mouth 2 (two) times daily.   clopidogrel (PLAVIX) 75 MG tablet Take  1 tablet (75 mg total) by mouth daily.   dorzolamide-timolol (COSOPT) 2-0.5 % ophthalmic solution Place 1 drop into both eyes 2 (two) times daily.   dorzolamide-timolol (COSOPT) 22.3-6.8 MG/ML ophthalmic solution Instill 1 drop into both eyes twice a day   empagliflozin (JARDIANCE) 10 MG TABS tablet Take 1 tablet (10 mg total) by mouth daily before breakfast.   eplerenone (INSPRA) 25 MG tablet Take 0.5 tablets (12.5 mg total) by mouth daily.   furosemide (LASIX) 40 MG tablet Take 1 tablet (40 mg total) by mouth as needed. Take 1 tablet (40 mg total) by mouth as needed (for wt gain of 3 lbs in a day or 5 lbs in a week).   glucose  blood (ACCU-CHEK GUIDE) test strip USE TO CHECK BLOOD SUGAR ONCE DAILY   ivabradine (CORLANOR) 5 MG TABS tablet Take 1 tablet (5 mg total) by mouth 2 (two) times daily with a meal.   Lancets Misc. (ACCU-CHEK FASTCLIX LANCET) KIT Use as directed once daily   latanoprost (XALATAN) 0.005 % ophthalmic solution Place 1 drop into both eyes every evening.   latanoprost (XALATAN) 0.005 % ophthalmic solution 1 drop at bedtime.   liraglutide (VICTOZA) 18 MG/3ML SOPN Inject 0.6 mg into the skin daily.   metFORMIN (GLUCOPHAGE) 500 MG tablet Take 1 tablet (500 mg total) by mouth 2 (two) times daily with a meal.   nitroGLYCERIN (NITROSTAT) 0.4 MG SL tablet Use one pill every 5 minutes X 3 if needed for chest pain. Contact MD if pain does not improve   sacubitril-valsartan (ENTRESTO) 49-51 MG Take 1 tablet by mouth 2 (two) times daily.   vitamin B-12 (CYANOCOBALAMIN) 1000 MCG tablet Take 1 tablet (1,000 mcg total) by mouth daily.   [DISCONTINUED] Blood Glucose Monitoring Suppl (ACCU-CHEK GUIDE) w/Device KIT 1 kit by Does not apply route daily.   No facility-administered medications prior to visit.    Allergies  Allergen Reactions   Bidil [Isosorb Dinitrate-Hydralazine] Other (See Comments)    Patient became lightheaded with 1 tablet tid.     Patient Care Team: Lucious Groves, DO as PCP - General (Internal Medicine) Bensimhon, Shaune Pascal, MD as PCP - Advanced Heart Failure (Cardiology) Sueanne Margarita, MD as PCP - Sleep Medicine (Cardiology) Corey Harold, MD as Consulting Physician (Ophthalmology) [facility] Belinda Fisher, MD as Consulting Physician (Ophthalmology)      Objective  BP 100/63 (BP Location: Left Arm, Patient Position: Sitting, Cuff Size: Normal)   Pulse 76   Temp 98.1 F (36.7 C) (Oral)   Ht _0  (1.676 m)   SpO2 100% Comment: RA  BMI 22.39 kg/m  BP Readings from Last 3 Encounters:  07/13/22 100/63  01/12/22 120/64  11/16/21 116/68   Wt Readings from  Last 3 Encounters:  01/12/22 138 lb 11.2 oz (62.9 kg)  11/16/21 141 lb (64 kg)  08/25/21 148 lb (67.1 kg)      Physical Exam Vitals and nursing note reviewed.  Constitutional:      Appearance: Normal appearance.  Cardiovascular:     Rate and Rhythm: Normal rate and regular rhythm.  Pulmonary:     Effort: Pulmonary effort is normal.     Breath sounds: Normal breath sounds. No wheezing.  Musculoskeletal:     Right lower leg: No edema.     Left lower leg: No edema.  Neurological:     Mental Status: He is alert.       Most recent functional status assessment:  07/13/2022   10:36 AM  In your present state of health, do you have any difficulty performing the following activities:  Hearing? 1  Vision? 0  Difficulty concentrating or making decisions? 0  Walking or climbing stairs? 1  Dressing or bathing? 0  Doing errands, shopping? 1   Most recent fall risk assessment:    07/13/2022   10:35 AM  [place] in the past year? 0  Number falls in past yr: 0  Injury with Fall? 0  Risk for fall due to : No Fall Risks  Follow up Falls evaluation completed;Falls prevention discussed    Most recent depression screenings:    07/13/2022   10:36 AM 01/12/2022    8:54 AM  PHQ 2/9 Scores  PHQ - 2 Score 0 0   Most recent cognitive screening:     No data to display         Most recent Audit-C alcohol use screening    07/13/2022    3:28 PM  Alcohol Use Disorder Test (AUDIT)  1. How often do you have a drink containing alcohol? 0  2. How many drinks containing alcohol do you have on a typical day when you are drinking? 0  3. How often do you have six or more drinks on one occasion? 0  AUDIT-C Score 0   A score of 3 or more in women, and 4 or more in men indicates increased risk for alcohol abuse, EXCEPT if all of the points are from question 1   Vision/Hearing Screen: No results found.  Last CBC Lab Results  Component Value Date   WBC 6.5 11/16/2021    HGB 9.8 (L) 11/16/2021   HCT 31.6 (L) 11/16/2021   MCV 81.0 11/16/2021   MCH 25.1 (L) 11/16/2021   RDW 13.8 11/16/2021   PLT 236 42/35/3614   Last metabolic panel Lab Results  Component Value Date   GLUCOSE 112 (H) 01/12/2022   NA 143 01/12/2022   K 4.2 01/12/2022   CL 105 01/12/2022   CO2 22 01/12/2022   BUN 25 01/12/2022   CREATININE 1.68 (H) 01/12/2022   EGFR 43 (L) 01/12/2022   CALCIUM 9.1 01/12/2022   PHOS 3.7 01/07/2016   PROT 7.3 08/25/2020   ALBUMIN 3.8 08/25/2020   BILITOT 0.7 08/25/2020   ALKPHOS 62 08/25/2020   AST 21 08/25/2020   ALT 20 08/25/2020   ANIONGAP 7 11/16/2021   Last lipids Lab Results  Component Value Date   CHOL 115 08/25/2021   HDL 46 08/25/2021   LDLCALC 56 08/25/2021   TRIG 59 08/25/2021   CHOLHDL 2.5 08/25/2021      Results for orders placed or performed in visit on 07/13/22  Glucose, capillary  Result Value Ref Range   Glucose-Capillary 113 (H) 70 - 99 mg/dL  POC Hbg A1C  Result Value Ref Range   Hemoglobin A1C 6.0 (A) 4.0 - 5.6 %   HbA1c POC (<> result, manual entry)     HbA1c, POC (prediabetic range)     HbA1c, POC (controlled diabetic range)        Assessment & Plan   Annual wellness visit done today including the all of the following: Reviewed patient's Family Medical History Reviewed and updated list of patient's medical providers Assessment of cognitive impairment was done Assessed patient's functional ability Established a written schedule for health screening [place] Completed and Reviewed  Exercise Activities and Dietary recommendations  Goals  HEMOGLOBIN A1C < 6.5        Immunization History  Administered Date(s) Administered   Fluad Quad(high Dose 65+) 05/12/2021, 07/13/2022   Influenza,inj,Quad PF,6+ Mos 10/06/2016, 04/12/2017, 05/01/2018, 07/10/2019, 07/01/2020   PFIZER(Purple Top)SARS-COV-2 Vaccination 10/23/2019, 11/13/2019, 07/15/2020   Pneumococcal Conjugate-13  12/07/2016   Pneumococcal Polysaccharide-23 02/28/2018   Tdap 01/12/2022    Health Maintenance  Topic Date Due   COLONOSCOPY (Pts 45-52yr Insurance coverage will need to be confirmed)  Never done   Zoster Vaccines- Shingrix (1 of 2) Never done   COVID-19 Vaccine (4 - 2023-24 season) 03/24/2022   FOOT EXAM  08/15/2022   COLON CANCER SCREENING ANNUAL FOBT  08/30/2022   HEMOGLOBIN A1C  10/12/2022   OPHTHALMOLOGY EXAM  10/29/2022   Diabetic kidney evaluation - eGFR measurement  01/13/2023   Diabetic kidney evaluation - Urine ACR  01/13/2023   Medicare Annual Wellness (AWV)  07/14/2023   DTaP/Tdap/Td (2 - Td or Tdap) 01/13/2032   Pneumonia Vaccine 71 Years old  Completed   INFLUENZA VACCINE  Completed   Hepatitis C Screening  Completed   HPV VACCINES  Aged Out     Discussed health benefits of physical activity, and encouraged him to engage in regular exercise appropriate for his age and condition.    Problem List Items Addressed This Visit       Cardiovascular and Mediastinum   Chronic combined systolic and diastolic heart failure (HCC) (Chronic)    Appears euvolemic on exam blood pressure well-controlled continue GDMT.        Endocrine   Controlled type 2 diabetes mellitus with diabetic neuropathy, without long-term current use of insulin (HCC) (Chronic)    Seen for new glucometer for him.  Will plan to continue Victoza his A1c is excellent controlled.  Want to keep it this way and reduce ASCVD risk is he has very high risk.      Relevant Orders   POC Hbg A1C (Completed)     Other   Health care maintenance - Primary    Flu shot today,  Discussed going to pharmacy for CPavillionbooster today as well.      Other Visit Diagnoses     Need for immunization against influenza       Relevant Orders   Flu Vaccine QUAD High Dose(Fluad) (Completed)       Return 3-4 months.     ELucious Groves DO

## 2022-07-13 NOTE — Assessment & Plan Note (Signed)
Flu shot today,  Discussed going to pharmacy for COVID booster today as well.

## 2022-07-13 NOTE — Assessment & Plan Note (Signed)
Seen for new glucometer for him.  Will plan to continue Victoza his A1c is excellent controlled.  Want to keep it this way and reduce ASCVD risk is he has very high risk.

## 2022-07-13 NOTE — Patient Instructions (Signed)
  Eddie [name] , Thank you for taking time to come for your Medicare Wellness Visit. I appreciate your ongoing commitment to your health goals. Please review the following plan we discussed and let me know if I can assist you in the future.   These are the goals we discussed:  Goals   None     This is a list of the screening recommended for you and due dates:  Health Maintenance  Topic Date Due   Medicare Annual Wellness Visit  Never done   Colon Cancer Screening  Never done   Zoster (Shingles) Vaccine (1 of 2) Never done   Flu Shot  02/21/2022   COVID-19 Vaccine (4 - 2023-24 season) 03/24/2022   Complete foot exam   08/15/2022   Stool Blood Test  08/30/2022   Hemoglobin A1C  10/12/2022   Eye exam for diabetics  10/29/2022   Yearly kidney function blood test for diabetes  01/13/2023   Yearly kidney health urinalysis for diabetes  01/13/2023   DTaP/Tdap/Td vaccine (2 - Td or Tdap) 01/13/2032   Pneumonia Vaccine  Completed   Hepatitis C Screening: USPSTF Recommendation to screen - Ages 79-79 yo.  Completed   HPV Vaccine  Aged Out

## 2022-07-13 NOTE — Assessment & Plan Note (Signed)
Appears euvolemic on exam blood pressure well-controlled continue GDMT.

## 2022-07-14 ENCOUNTER — Other Ambulatory Visit (HOSPITAL_COMMUNITY): Payer: Self-pay

## 2022-07-15 ENCOUNTER — Other Ambulatory Visit (HOSPITAL_COMMUNITY): Payer: Self-pay | Admitting: Internal Medicine

## 2022-07-17 ENCOUNTER — Other Ambulatory Visit (HOSPITAL_COMMUNITY): Payer: Self-pay | Admitting: Internal Medicine

## 2022-07-17 ENCOUNTER — Other Ambulatory Visit: Payer: Self-pay

## 2022-07-18 ENCOUNTER — Other Ambulatory Visit (HOSPITAL_COMMUNITY): Payer: Self-pay

## 2022-07-18 MED ORDER — FUROSEMIDE 40 MG PO TABS
40.0000 mg | ORAL_TABLET | ORAL | 3 refills | Status: DC | PRN
  Filled 2022-07-18: qty 30, 30d supply, fill #0
  Filled 2022-08-12: qty 30, 30d supply, fill #1
  Filled 2022-09-17: qty 30, 30d supply, fill #2
  Filled 2022-10-12: qty 30, 30d supply, fill #3

## 2022-07-18 MED ORDER — CLOPIDOGREL BISULFATE 75 MG PO TABS
75.0000 mg | ORAL_TABLET | Freq: Every day | ORAL | 3 refills | Status: DC
  Filled 2022-07-18: qty 90, 90d supply, fill #0
  Filled 2022-10-12: qty 90, 90d supply, fill #1
  Filled 2023-01-13: qty 90, 90d supply, fill #2
  Filled 2023-04-10: qty 90, 90d supply, fill #3

## 2022-07-19 ENCOUNTER — Other Ambulatory Visit: Payer: Self-pay | Admitting: Internal Medicine

## 2022-07-19 ENCOUNTER — Other Ambulatory Visit (HOSPITAL_COMMUNITY): Payer: Self-pay

## 2022-07-19 MED ORDER — VICTOZA 18 MG/3ML ~~LOC~~ SOPN
0.6000 mg | PEN_INJECTOR | Freq: Every day | SUBCUTANEOUS | 3 refills | Status: DC
  Filled 2022-07-19: qty 9, 90d supply, fill #0
  Filled 2023-03-03: qty 9, 90d supply, fill #1

## 2022-07-19 NOTE — Telephone Encounter (Signed)
Patient's spouse was in the office requesting refill on patient's medication, stated she has contacted the pharmacy, but unable to find record.  Sending request to triage to follow up.    MED REFILL REQUEST  liraglutide (VICTOZA) 18 MG/3ML SOPN   [facility] Phone: [phone]  Fax: [phone]

## 2022-07-31 ENCOUNTER — Other Ambulatory Visit (HOSPITAL_COMMUNITY): Payer: Self-pay

## 2022-07-31 ENCOUNTER — Other Ambulatory Visit (HOSPITAL_COMMUNITY): Payer: Self-pay | Admitting: Family Medicine

## 2022-07-31 MED ORDER — EPLERENONE 25 MG PO TABS
12.5000 mg | ORAL_TABLET | Freq: Every day | ORAL | 0 refills | Status: DC
  Filled 2022-07-31: qty 45, 90d supply, fill #0

## 2022-08-02 ENCOUNTER — Other Ambulatory Visit (HOSPITAL_COMMUNITY): Payer: Self-pay

## 2022-08-02 ENCOUNTER — Other Ambulatory Visit: Payer: Self-pay | Admitting: Internal Medicine

## 2022-08-03 ENCOUNTER — Other Ambulatory Visit (HOSPITAL_COMMUNITY): Payer: Self-pay

## 2022-08-03 MED ORDER — ACCU-CHEK GUIDE VI STRP
ORAL_STRIP | Freq: Every day | 12 refills | Status: DC
Start: 2022-08-03 — End: 2023-09-05
  Filled 2022-08-03: qty 100, 90d supply, fill #0
  Filled 2022-11-07: qty 100, 90d supply, fill #1
  Filled 2023-01-13: qty 100, 90d supply, fill #2
  Filled 2023-04-17: qty 100, 90d supply, fill #3

## 2022-08-04 ENCOUNTER — Other Ambulatory Visit (HOSPITAL_COMMUNITY): Payer: Self-pay

## 2022-08-24 ENCOUNTER — Ambulatory Visit (INDEPENDENT_AMBULATORY_CARE_PROVIDER_SITE_OTHER): Payer: 59 | Admitting: Vascular Surgery

## 2022-08-24 ENCOUNTER — Encounter: Payer: Self-pay | Admitting: Vascular Surgery

## 2022-08-24 ENCOUNTER — Ambulatory Visit (HOSPITAL_COMMUNITY)
Admission: RE | Admit: 2022-08-24 | Discharge: 2022-08-24 | Disposition: A | Discharge status: Home / Self Care | Payer: 59 | Source: Ambulatory Visit | Attending: Vascular Surgery | Admitting: Vascular Surgery

## 2022-08-24 VITALS — BP 121/66 | HR 56 | Temp 98.0°F | Resp 20 | Ht 66.0 in | Wt 135.0 lb

## 2022-08-24 DIAGNOSIS — I739 Peripheral vascular disease, unspecified: Secondary | ICD-10-CM | POA: Insufficient documentation

## 2022-08-24 LAB — VAS US ABI WITH/WO TBI

## 2022-08-24 NOTE — Progress Notes (Signed)
REASON FOR VISIT:   Follow-up of peripheral arterial disease  MEDICAL ISSUES:   PERIPHERAL ARTERIAL DISEASE: This patient has evidence of mild inflow disease and significant infrainguinal arterial occlusive disease bilaterally.  However currently his symptoms are quite stable.  I really do not get any history of claudication although I suspect his activity is limited by his COPD.  He denies any rest pain or nonhealing ulcers.  However his toe pressures are fairly low so I have recommended a follow-up visit with ABIs in 9 months.  I explained that he will be seen by the PAs at that time.  We have again discussed the importance of tobacco cessation.  I encouraged him to stay as active as possible.  We also discussed the importance of nutrition.  I favor a largely plant-based diet.  He will call sooner if he develops any worsening symptoms or wounds on his feet.  HPI:   Eddie [name] is a pleasant 72 y.o. male who I last saw on 08/25/2021.  He had evidence of multilevel arterial occlusive disease.  He had diminished femoral pulses but also significant infrainguinal arterial occlusive disease.  He had stable claudication with no rest pain.  He comes in for a routine follow-up visit.  I felt that if his symptoms progressed we could consider arteriography with CO2 and limited contrast.  Since I saw the patient last his only complaint is that his foot feels cold at night.  He denies any claudication although I believe his activity is fairly limited because of his COPD.  He does not describe any rest pain.  He does get some cramps in his right leg at night.  He denies any history of nonhealing ulcers.  He tells me that he is cut back to 3 cigarettes a day and is trying to get off completely.  His risk factors for peripheral arterial disease include type 2 diabetes, hypertension, and tobacco use.  He has multiple other medical comorbidities including COPD, coronary artery disease, and combined systolic  and diastolic congestive heart failure.  Past Medical History:  Diagnosis Date   Blindness of left eye    CAD (coronary artery disease)    a. STEMI 09/2015 w/ DES to Prox LAD   CVA (cerebral infarction)    a. 09/2015: acute small right frontal lobe infarct   Diabetes ([place])    Hypertension    Ischemic cardiomyopathy    OSA (obstructive sleep apnea) 07/06/2017   Mild OSA with AHI of 8.2/hr with hypoxemia as low as 76% now on CPAP at 11cm H2O.   PUD (peptic ulcer disease)    ST elevation myocardial infarction involving left anterior descending (LAD) coronary artery ([place]) 09/28/2015    Family History  Problem Relation Age of Onset   Diabetes Mother    Cancer Neg Hx     SOCIAL HISTORY: Social History   Tobacco Use   Smoking status: Some Days    Packs/day: 0.25    Types: Cigarettes   Smokeless tobacco: Never  Substance Use Topics   Alcohol use: No    Alcohol/week: 0.0 standard drinks of alcohol    Allergies  Allergen Reactions   Bidil [Isosorb Dinitrate-Hydralazine] Other (See Comments)    Patient became lightheaded with 1 tablet tid.     Current Outpatient Medications  Medication Sig Dispense Refill   Accu-Chek FastClix Lancets MISC Use to check blood sugar 1 time a day 102 each 3   Acetaminophen 325 MG CAPS Take 2 tablets  by mouth every 4 (four) hours as needed.     albuterol (VENTOLIN HFA) 108 (90 Base) MCG/ACT inhaler Inhale 2 puffs into the lungs every 6 (six) hours as needed for wheezing or shortness of breath. 8.5 g 2   amLODipine (NORVASC) 5 MG tablet Take 1 tablet (5 mg total) by mouth daily. 90 tablet 3   aspirin EC 81 MG tablet Take 1 tablet (81 mg total) by mouth daily. 90 tablet 3   atorvastatin (LIPITOR) 80 MG tablet Take 1 tablet (80 mg total) by mouth daily. 90 tablet 3   carvedilol (COREG) 25 MG tablet Take 1 tablet (25 mg total) by mouth 2 (two) times daily. 180 tablet 3   clopidogrel (PLAVIX) 75 MG tablet Take 1 tablet (75 mg total) by mouth daily. 90  tablet 3   dorzolamide-timolol (COSOPT) 2-0.5 % ophthalmic solution Place 1 drop into both eyes 2 (two) times daily. 15 mL 12   dorzolamide-timolol (COSOPT) 22.3-6.8 MG/ML ophthalmic solution Instill 1 drop into both eyes twice a day 10 mL 2   empagliflozin (JARDIANCE) 10 MG TABS tablet Take 1 tablet (10 mg total) by mouth daily before breakfast. 90 tablet 3   eplerenone (INSPRA) 25 MG tablet Take 1/2  tablet (12.5 mg total) by mouth daily. 45 tablet 0   furosemide (LASIX) 40 MG tablet Take 1 tablet (40 mg total) by mouth as needed. Take 1 tablet (40 mg total) by mouth as needed (for weight gain of 3 lbs in a day or 5 lbs in a week). 30 tablet 3   glucose blood (ACCU-CHEK GUIDE) test strip USE TO CHECK BLOOD SUGAR ONCE DAILY 100 strip 12   Insulin Pen Needle (UNIFINE PENTIPS) 31G X 5 MM MISC Use daily as directed 100 each 5   ivabradine (CORLANOR) 5 MG TABS tablet Take 1 tablet (5 mg total) by mouth 2 (two) times daily with a meal. 180 tablet 5   Lancets Misc. (ACCU-CHEK FASTCLIX LANCET) KIT Use as directed once daily 1 kit 1   latanoprost (XALATAN) 0.005 % ophthalmic solution Place 1 drop into both eyes every evening. 7.5 mL 12   latanoprost (XALATAN) 0.005 % ophthalmic solution 1 drop at bedtime.     liraglutide (VICTOZA) 18 MG/3ML SOPN Inject 0.6 mg into the skin daily. 9 mL 3   metFORMIN (GLUCOPHAGE) 500 MG tablet Take 1 tablet (500 mg total) by mouth 2 (two) times daily with a meal. 180 tablet 3   nitroGLYCERIN (NITROSTAT) 0.4 MG SL tablet Use one pill every 5 minutes X 3 if needed for chest pain. Contact MD if pain does not improve 30 tablet 0   sacubitril-valsartan (ENTRESTO) 49-51 MG Take 1 tablet by mouth 2 (two) times daily. 60 tablet 6   vitamin B-12 (CYANOCOBALAMIN) 1000 MCG tablet Take 1 tablet (1,000 mcg total) by mouth daily. 90 tablet 1   Blood Glucose Monitoring Suppl (ACCU-CHEK GUIDE) w/Device KIT Use as directed 1 kit 0   No current facility-administered medications for this  visit.    REVIEW OF SYSTEMS:  [X]  denotes positive finding, [ ]  denotes negative finding Cardiac  Comments:  Chest pain or chest pressure:    Shortness of breath upon exertion: x   Short of breath when lying flat:    Irregular heart rhythm:        Vascular    Pain in calf, thigh, or hip brought on by ambulation:    Pain in feet at night that wakes you up from your  sleep:     Blood clot in your veins:    Leg swelling:         Pulmonary    Oxygen at home:    Productive cough:     Wheezing:         Neurologic    Sudden weakness in arms or legs:     Sudden numbness in arms or legs:     Sudden onset of difficulty speaking or slurred speech:    Temporary loss of vision in one eye:     Problems with dizziness:         Gastrointestinal    Blood in stool:     Vomited blood:         Genitourinary    Burning when urinating:     Blood in urine:        Psychiatric    Major depression:         Hematologic    Bleeding problems:    Problems with blood clotting too easily:        Skin    Rashes or ulcers:        Constitutional    Fever or chills:     PHYSICAL EXAM:   Vitals:   08/24/22 0831  BP: 121/66  Pulse: (!) 56  Resp: 20  Temp: 98 F (36.7 C)  SpO2: 97%  Weight: 135 lb (61.2 kg)  Height: 5\' 6"  (1.676 m)    GENERAL: The patient is a well-nourished male, in no acute distress. The vital signs are documented above. CARDIAC: There is a regular rate and rhythm.  VASCULAR: I do not detect carotid bruits. He has palpable femoral pulses. I cannot palpate popliteal or pedal pulses. PULMONARY: There is good air exchange bilaterally without wheezing or rales. ABDOMEN: Soft and non-tender with normal pitched bowel sounds.  I do not palpate any aneurysm. MUSCULOSKELETAL: There are no major deformities or cyanosis. NEUROLOGIC: No focal weakness or paresthesias are detected. SKIN: There are no ulcers or rashes noted. PSYCHIATRIC: The patient has a normal  affect.  DATA:    ARTERIAL DOPPLER STUDY: I have independently interpreted his arterial Doppler study today.  Of note he is arteries are noncompressible and ABIs could not be obtained.  On the right side, there is a monophasic posterior tibial and dorsalis pedis signal.  Toe pressures 36 mmHg.   On the left side, there is a monophasic posterior tibial signal and a dampened monophasic dorsalis pedis signal.  Toe pressures 33 mmHg.  His toe pressures on both sides remain stable.  Deitra Mayo [facility] [phone]

## 2022-09-22 ENCOUNTER — Ambulatory Visit (INDEPENDENT_AMBULATORY_CARE_PROVIDER_SITE_OTHER): Payer: 59 | Admitting: Podiatry

## 2022-09-22 ENCOUNTER — Encounter: Payer: Self-pay | Admitting: Podiatry

## 2022-09-22 VITALS — BP 112/54

## 2022-09-22 DIAGNOSIS — I739 Peripheral vascular disease, unspecified: Secondary | ICD-10-CM

## 2022-09-22 DIAGNOSIS — M2041 Other hammer toe(s) (acquired), right foot: Secondary | ICD-10-CM | POA: Diagnosis not present

## 2022-09-22 DIAGNOSIS — Z794 Long term (current) use of insulin: Secondary | ICD-10-CM

## 2022-09-22 DIAGNOSIS — B351 Tinea unguium: Secondary | ICD-10-CM | POA: Diagnosis not present

## 2022-09-22 DIAGNOSIS — M79675 Pain in left toe(s): Secondary | ICD-10-CM | POA: Diagnosis not present

## 2022-09-22 DIAGNOSIS — M2042 Other hammer toe(s) (acquired), left foot: Secondary | ICD-10-CM

## 2022-09-22 DIAGNOSIS — E1140 Type 2 diabetes mellitus with diabetic neuropathy, unspecified: Secondary | ICD-10-CM

## 2022-09-22 DIAGNOSIS — E119 Type 2 diabetes mellitus without complications: Secondary | ICD-10-CM | POA: Diagnosis not present

## 2022-09-22 DIAGNOSIS — M79674 Pain in right toe(s): Secondary | ICD-10-CM

## 2022-09-22 NOTE — Progress Notes (Unsigned)
ANNUAL DIABETIC FOOT EXAM  Subjective: Eddie [name] presents today 72 presents today {jgcomplaint:23593}.  Chief Complaint  Patient presents with   Nail Problem    DFC BS-132 A1C-do not know Christell Faith PCP VST-05/2022   Patient confirms h/o diabetes.  Patient relates {Numbers; 0-100:15068} year h/o diabetes.  Patient denies any h/o foot wounds.  Patient has h/o foot ulcer of {jgPodToeLocator:23637}, which healed via help of ***.  Patient has h/o amputation(s): {jgamp:23617}.  Patient endorses symptoms of foot numbness.   Patient endorses symptoms of foot tingling.  Patient endorses symptoms of burning in feet.  Patient endorses symptoms of pins/needles sensation in feet.  Patient denies any numbness, tingling, burning, or pins/needle sensation in feet.  Patient has been diagnosed with neuropathy and it is managed with {JGNEUROPATHYMEDS:27053}.  Risk factors: {jgriskfactors:24044}.  Lucious Groves, DO is patient's PCP. Last visit was {Time; dates multiple:15870}***.  Past Medical History:  Diagnosis Date   Blindness of left eye    CAD (coronary artery disease)    a. STEMI 09/2015 w/ DES to Prox LAD   CVA (cerebral infarction)    a. 09/2015: acute small right frontal lobe infarct   Diabetes ([place])    Hypertension    Ischemic cardiomyopathy    OSA (obstructive sleep apnea) 07/06/2017   Mild OSA with AHI of 8.2/hr with hypoxemia as low as 76% now on CPAP at 11cm H2O.   PUD (peptic ulcer disease)    ST elevation myocardial infarction involving left anterior descending (LAD) coronary artery ([place] 09/28/2015   Patient Active Problem List   Diagnosis Date Noted   PAD (peripheral artery disease) ([place]) 08/25/2021   Nocturia 08/25/2021   Normocytic anemia 02/10/2021   Restless leg 02/10/2021   Impingement syndrome of right shoulder 01/01/2020   COPD (chronic obstructive pulmonary disease) with emphysema ([place]) 07/10/2019   Complaining of cold hands 10/04/2018   Pain in right  shoulder 05/30/2018   Primary open angle glaucoma of both eyes, severe stage 03/07/2018   Cataract of left eye 03/07/2018   Neuropathy, 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, right 03/03/2018   OSA (obstructive sleep apnea) 07/06/2017   Shoulder impingement syndrome, left 04/13/2017   Dyspnea on exertion 12/08/2016   CAD in native artery 10/06/2016   CKD (chronic kidney disease) stage 3, GFR 30-59 ml/min (HCC) 10/06/2016   Health care maintenance 10/06/2016   Esophageal reflux    Chronic combined systolic and diastolic heart failure (HCC)    Gait disturbance, post-stroke 12/09/2015   Tobacco abuse    Essential hypertension    Controlled type 2 diabetes mellitus with diabetic neuropathy, without long-term current use of insulin ([place])    History of CVA (cerebrovascular accident)    Cataract, nuclear sclerotic, left eye 03/15/2012   Pseudophakia of right eye 03/15/2012   Past Surgical History:  Procedure Laterality Date   CARDIAC CATHETERIZATION N/A 09/28/2015   Procedure: Left Heart Cath and Coronary Angiography;  Surgeon: Lorretta Harp, MD;  Location: [facility];  Service: Cardiovascular;  Laterality: N/A;   CARDIAC CATHETERIZATION N/A 10/08/2015   Procedure: Left Heart Cath and Coronary Angiography;  Surgeon: Belva Crome, MD;  Location: [facility];  Service: Cardiovascular;  Laterality: N/A;   CATARACT EXTRACTION Right    Repair of Peptic Ulcer     Current Outpatient Medications on File Prior to Visit  Medication Sig Dispense Refill   Accu-Chek FastClix Lancets MISC Use to check blood sugar 1 time a day 102 each 3   Acetaminophen 325  MG CAPS Take 2 tablets by mouth every 4 (four) hours as needed.     albuterol (VENTOLIN HFA) 108 (90 Base) MCG/ACT inhaler Inhale 2 puffs into the lungs every 6 (six) hours as needed for wheezing or shortness of breath. 8.5 g 2   amLODipine (NORVASC) 5 MG tablet Take 1 tablet (5 mg total) by mouth daily. 90 tablet 3   aspirin EC 81 MG tablet Take 1 tablet  (81 mg total) by mouth daily. 90 tablet 3   atorvastatin (LIPITOR) 80 MG tablet Take 1 tablet (80 mg total) by mouth daily. 90 tablet 3   Blood Glucose Monitoring Suppl (ACCU-CHEK GUIDE) w/Device KIT Use as directed 1 kit 0   carvedilol (COREG) 25 MG tablet Take 1 tablet (25 mg total) by mouth 2 (two) times daily. 180 tablet 3   clopidogrel (PLAVIX) 75 MG tablet Take 1 tablet (75 mg total) by mouth daily. 90 tablet 3   dorzolamide-timolol (COSOPT) 2-0.5 % ophthalmic solution Place 1 drop into both eyes 2 (two) times daily. 15 mL 12   dorzolamide-timolol (COSOPT) 22.3-6.8 MG/ML ophthalmic solution Instill 1 drop into both eyes twice a day 10 mL 2   empagliflozin (JARDIANCE) 10 MG TABS tablet Take 1 tablet (10 mg total) by mouth daily before breakfast. 90 tablet 3   eplerenone (INSPRA) 25 MG tablet Take 1/2  tablet (12.5 mg total) by mouth daily. 45 tablet 0   furosemide (LASIX) 40 MG tablet Take 1 tablet (40 mg total) by mouth as needed. Take 1 tablet (40 mg total) by mouth as needed (for weight gain of 3 lbs in a day or 5 lbs in a week). 30 tablet 3   glucose blood (ACCU-CHEK GUIDE) test strip USE TO CHECK BLOOD SUGAR ONCE DAILY 100 strip 12   Insulin Pen Needle (UNIFINE PENTIPS) 31G X 5 MM MISC Use daily as directed 100 each 5   ivabradine (CORLANOR) 5 MG TABS tablet Take 1 tablet (5 mg total) by mouth 2 (two) times daily with a meal. 180 tablet 5   Lancets Misc. (ACCU-CHEK FASTCLIX LANCET) KIT Use as directed once daily 1 kit 1   latanoprost (XALATAN) 0.005 % ophthalmic solution Place 1 drop into both eyes every evening. 7.5 mL 12   latanoprost (XALATAN) 0.005 % ophthalmic solution 1 drop at bedtime.     liraglutide (VICTOZA) 18 MG/3ML SOPN Inject 0.6 mg into the skin daily. 9 mL 3   metFORMIN (GLUCOPHAGE) 500 MG tablet Take 1 tablet (500 mg total) by mouth 2 (two) times daily with a meal. 180 tablet 3   nitroGLYCERIN (NITROSTAT) 0.4 MG SL tablet Use one pill every 5 minutes X 3 if needed for  chest pain. Contact MD if pain does not improve 30 tablet 0   sacubitril-valsartan (ENTRESTO) 49-51 MG Take 1 tablet by mouth 2 (two) times daily. 60 tablet 6   vitamin B-12 (CYANOCOBALAMIN) 1000 MCG tablet Take 1 tablet (1,000 mcg total) by mouth daily. 90 tablet 1   No current facility-administered medications on file prior to visit.    Allergies  Allergen Reactions   Bidil [Isosorb Dinitrate-Hydralazine] Other (See Comments)    Patient became lightheaded with 1 tablet tid.    Social History   Occupational History   Occupation: Electrical engineer: [employer]  Tobacco Use   Smoking status: Some Days    Packs/day: 0.25    Types: Cigarettes   Smokeless tobacco: Never  Vaping Use  Vaping Use: Never used  Substance and Sexual Activity   Alcohol use: No    Alcohol/week: 0.0 standard drinks of alcohol   Drug use: No   Sexual activity: Not on file   Family History  Problem Relation Age of Onset   Diabetes Mother    Cancer Neg Hx    Immunization History  Administered Date(s) Administered   Fluad Quad(high Dose 65+) 05/12/2021, 07/13/2022   Influenza,inj,Quad PF,6+ Mos 10/06/2016, 04/12/2017, 05/01/2018, 07/10/2019, 07/01/2020   PFIZER(Purple Top)SARS-COV-2 Vaccination 10/23/2019, 11/13/2019, 07/15/2020   Pneumococcal Conjugate-13 12/07/2016   Pneumococcal Polysaccharide-23 02/28/2018   Tdap 01/12/2022     Review of Systems: Negative except as noted in the HPI.   Objective: Vitals:   09/22/22 1002  BP: (!) 112/54    KAHLIL KIRN is a pleasant 72 y.o. male in NAD. AAO X 3.  Vascular Examination: {jgvascular:23595}  Dermatological Examination: {jgderm:23598}  Neurological Examination: {jgneuro:23601::"Protective sensation intact 5/5 intact bilaterally with 10g monofilament b/l.","Vibratory sensation intact b/l.","Proprioception intact bilaterally."}  Musculoskeletal Examination: {jgmsk:23600}  Footwear Assessment: Does the patient wear  appropriate shoes? {Yes,No}. Does the patient need inserts/orthotics? {Yes,No}.  Lab Results  Component Value Date   HGBA1C 6.0 (A) 07/13/2022   VAS [place] ABI WITH/WO TBI  Result Date: 08/24/2022  LOWER EXTREMITY DOPPLER STUDY Patient Name:  FLOSSIE AMATO  Date of Exam:   08/24/2022 Medical Rec #: [id number]      Accession #:    [id number] Date of Birth: 1951/02/09      Patient Gender: M Patient Age:   72 years Exam Location:  [facility] Procedure:      VAS [place] ABI WITH/WO TBI Referring Phys: --------------------------------------------------------------------------------  Indications: Peripheral artery disease. High Risk Factors: Diabetes, current smoker.  Performing Technologist: Ronal Fear RVS, RCS  Examination Guidelines: A complete evaluation includes at minimum, Doppler waveform signals and systolic blood pressure reading at the level of bilateral brachial, anterior tibial, and posterior tibial arteries, when vessel segments are accessible. Bilateral testing is considered an integral part of a complete examination. Photoelectric Plethysmograph (PPG) waveforms and toe systolic pressure readings are included as required and additional duplex testing as needed. Limited examinations for reoccurring indications may be performed as noted.  ABI Findings: +---------+------------------+-----+----------+--------+ Right    Rt Pressure (mmHg)IndexWaveform  Comment  +---------+------------------+-----+----------+--------+ Brachial 130                                       +---------+------------------+-----+----------+--------+ PTA                             monophasic         +---------+------------------+-----+----------+--------+ DP                              monophasic         +---------+------------------+-----+----------+--------+ Great Toe36                0.28                    +---------+------------------+-----+----------+--------+  +---------+------------------+-----+-------------------+-------+ Left     Lt Pressure (mmHg)IndexWaveform           Comment +---------+------------------+-----+-------------------+-------+ Brachial 130                                               +---------+------------------+-----+-------------------+-------+  PTA                             monophasic                 +---------+------------------+-----+-------------------+-------+ DP                              dampened monophasic        +---------+------------------+-----+-------------------+-------+ Great Toe33                0.25                            +---------+------------------+-----+-------------------+-------+ +-------+-----------+-----------+------------+------------+ ABI/TBIToday's ABIToday's TBIPrevious ABIPrevious TBI +-------+-----------+-----------+------------+------------+ Right  [place]         0.28       [place]          0.24         +-------+-----------+-----------+------------+------------+ Left   [place]         0.25                 0.27         +-------+-----------+-----------+------------+------------+  Bilateral TBIs appear essentially unchanged compared to prior study on 08/22/2021.  Summary: Right: Resting right ankle-brachial index indicates noncompressible right lower extremity arteries. The right toe-brachial index is abnormal. Left: Resting left ankle-brachial index indicates noncompressible left lower extremity arteries. The left toe-brachial index is abnormal. *See table(s) above for measurements and observations.  Electronically signed by Deitra Mayo MD on 08/24/2022 at 8:54:21 AM.    Final    ADA Risk Categorization: Low Risk :  Patient has all of the following: Intact protective sensation No prior foot ulcer  No severe deformity Pedal pulses present  High Risk  Patient has one or more of the following: Loss of protective sensation Absent pedal pulses Severe Foot  deformity History of foot ulcer  Assessment: No diagnosis found.   Plan: No orders of the defined types were placed in this encounter.   No orders of the defined types were placed in this encounter.   None  {jgplan:23602::"-Patient/POA to call should there be question/concern in the interim."} Return in about 3 months (around 12/23/2022).  Marzetta Board, DPM

## 2022-09-25 ENCOUNTER — Other Ambulatory Visit (HOSPITAL_COMMUNITY): Payer: Self-pay

## 2022-09-25 ENCOUNTER — Other Ambulatory Visit: Payer: Self-pay

## 2022-09-27 ENCOUNTER — Other Ambulatory Visit (HOSPITAL_COMMUNITY): Payer: Self-pay | Admitting: Internal Medicine

## 2022-09-27 ENCOUNTER — Other Ambulatory Visit (HOSPITAL_COMMUNITY): Payer: Self-pay

## 2022-09-27 MED ORDER — IVABRADINE HCL 5 MG PO TABS
5.0000 mg | ORAL_TABLET | Freq: Two times a day (BID) | ORAL | 5 refills | Status: DC
  Filled 2022-09-27: qty 180, 90d supply, fill #0
  Filled 2022-12-31: qty 180, 90d supply, fill #1

## 2022-10-29 ENCOUNTER — Other Ambulatory Visit (HOSPITAL_COMMUNITY): Payer: Self-pay | Admitting: Family Medicine

## 2022-10-30 ENCOUNTER — Other Ambulatory Visit (HOSPITAL_COMMUNITY): Payer: Self-pay

## 2022-10-30 MED ORDER — EPLERENONE 25 MG PO TABS
12.5000 mg | ORAL_TABLET | Freq: Every day | ORAL | 3 refills | Status: DC
Start: 2022-10-30 — End: 2023-10-14
  Filled 2022-10-30: qty 45, 90d supply, fill #0
  Filled 2023-01-28: qty 45, 90d supply, fill #1
  Filled 2023-04-24: qty 45, 90d supply, fill #2
  Filled 2023-07-21: qty 45, 90d supply, fill #3

## 2022-11-01 ENCOUNTER — Other Ambulatory Visit (HOSPITAL_COMMUNITY): Payer: Self-pay

## 2022-11-07 ENCOUNTER — Other Ambulatory Visit (HOSPITAL_COMMUNITY): Payer: Self-pay | Admitting: Internal Medicine

## 2022-11-07 ENCOUNTER — Other Ambulatory Visit (HOSPITAL_COMMUNITY): Payer: Self-pay

## 2022-11-07 MED ORDER — FUROSEMIDE 40 MG PO TABS
40.0000 mg | ORAL_TABLET | ORAL | 3 refills | Status: DC | PRN
  Filled 2022-11-07: qty 30, 30d supply, fill #0
  Filled 2022-12-12: qty 30, 30d supply, fill #1
  Filled 2023-01-13: qty 30, 30d supply, fill #2
  Filled 2023-02-08: qty 30, 30d supply, fill #3

## 2022-11-08 ENCOUNTER — Other Ambulatory Visit (HOSPITAL_COMMUNITY): Payer: Self-pay

## 2022-11-20 ENCOUNTER — Telehealth (HOSPITAL_COMMUNITY): Payer: Self-pay | Admitting: Vascular Surgery

## 2022-11-20 NOTE — Telephone Encounter (Signed)
Pt wife called reschedule 5/7 appt w/ db

## 2022-11-27 ENCOUNTER — Other Ambulatory Visit (HOSPITAL_COMMUNITY): Payer: Self-pay

## 2022-11-27 MED ORDER — DORZOLAMIDE HCL-TIMOLOL MAL 2-0.5 % OP SOLN
1.0000 [drp] | Freq: Two times a day (BID) | OPHTHALMIC | 12 refills | Status: AC
  Filled 2022-11-27: qty 10, 50d supply, fill #0
  Filled 2023-06-13: qty 10, 50d supply, fill #1
  Filled 2023-11-27: qty 10, 50d supply, fill #2

## 2022-11-27 MED ORDER — LATANOPROST 0.005 % OP SOLN
1.0000 [drp] | Freq: Every evening | OPHTHALMIC | 12 refills | Status: DC
  Filled 2022-11-27: qty 7.5, 75d supply, fill #0
  Filled 2023-06-13: qty 7.5, 75d supply, fill #1
  Filled 2023-11-27: qty 7.5, 75d supply, fill #2

## 2022-11-28 ENCOUNTER — Encounter (HOSPITAL_COMMUNITY): Payer: 59 | Admitting: Internal Medicine

## 2022-12-12 ENCOUNTER — Other Ambulatory Visit (HOSPITAL_COMMUNITY): Payer: Self-pay | Admitting: Family Medicine

## 2022-12-12 ENCOUNTER — Other Ambulatory Visit (HOSPITAL_COMMUNITY): Payer: Self-pay

## 2022-12-12 MED ORDER — ENTRESTO 49-51 MG PO TABS
1.0000 | ORAL_TABLET | Freq: Two times a day (BID) | ORAL | 6 refills | Status: DC
Start: 2022-12-12 — End: 2023-07-10
  Filled 2022-12-12: qty 60, 30d supply, fill #0
  Filled 2023-01-13: qty 60, 30d supply, fill #1
  Filled 2023-02-08: qty 60, 30d supply, fill #2
  Filled 2023-03-13: qty 60, 30d supply, fill #3
  Filled 2023-04-10: qty 60, 30d supply, fill #4
  Filled 2023-05-10: qty 60, 30d supply, fill #5
  Filled 2023-06-09: qty 60, 30d supply, fill #6

## 2022-12-24 ENCOUNTER — Other Ambulatory Visit (HOSPITAL_COMMUNITY): Payer: Self-pay | Admitting: Internal Medicine

## 2022-12-25 ENCOUNTER — Other Ambulatory Visit (HOSPITAL_COMMUNITY): Payer: Self-pay

## 2022-12-25 MED ORDER — EMPAGLIFLOZIN 10 MG PO TABS
10.0000 mg | ORAL_TABLET | Freq: Every day | ORAL | 3 refills | Status: DC
  Filled 2022-12-25: qty 90, 90d supply, fill #0
  Filled 2023-03-27: qty 90, 90d supply, fill #1
  Filled 2023-06-27: qty 90, 90d supply, fill #2
  Filled 2023-09-24: qty 90, 90d supply, fill #3

## 2023-01-01 ENCOUNTER — Other Ambulatory Visit: Payer: Self-pay

## 2023-01-02 ENCOUNTER — Other Ambulatory Visit (HOSPITAL_COMMUNITY): Payer: Self-pay

## 2023-01-09 ENCOUNTER — Ambulatory Visit (HOSPITAL_COMMUNITY)
Admission: RE | Admit: 2023-01-09 | Discharge: 2023-01-09 | Disposition: A | Discharge status: Home / Self Care | Payer: 59 | Source: Ambulatory Visit | Attending: Internal Medicine | Admitting: Internal Medicine

## 2023-01-09 ENCOUNTER — Encounter (HOSPITAL_COMMUNITY): Payer: Self-pay | Admitting: Internal Medicine

## 2023-01-09 VITALS — BP 104/50 | HR 58 | Wt 130.2 lb

## 2023-01-09 DIAGNOSIS — Z833 Family history of diabetes mellitus: Secondary | ICD-10-CM | POA: Insufficient documentation

## 2023-01-09 DIAGNOSIS — E1122 Type 2 diabetes mellitus with diabetic chronic kidney disease: Secondary | ICD-10-CM | POA: Insufficient documentation

## 2023-01-09 DIAGNOSIS — I5042 Chronic combined systolic (congestive) and diastolic (congestive) heart failure: Secondary | ICD-10-CM

## 2023-01-09 DIAGNOSIS — I480 Paroxysmal atrial fibrillation: Secondary | ICD-10-CM | POA: Diagnosis not present

## 2023-01-09 DIAGNOSIS — Z8673 Personal history of transient ischemic attack (TIA), and cerebral infarction without residual deficits: Secondary | ICD-10-CM | POA: Diagnosis not present

## 2023-01-09 DIAGNOSIS — Z720 Tobacco use: Secondary | ICD-10-CM | POA: Diagnosis not present

## 2023-01-09 DIAGNOSIS — Z955 Presence of coronary angioplasty implant and graft: Secondary | ICD-10-CM | POA: Diagnosis not present

## 2023-01-09 DIAGNOSIS — I130 Hypertensive heart and chronic kidney disease with heart failure and stage 1 through stage 4 chronic kidney disease, or unspecified chronic kidney disease: Secondary | ICD-10-CM | POA: Insufficient documentation

## 2023-01-09 DIAGNOSIS — N1832 Chronic kidney disease, stage 3b: Secondary | ICD-10-CM | POA: Insufficient documentation

## 2023-01-09 DIAGNOSIS — Z7902 Long term (current) use of antithrombotics/antiplatelets: Secondary | ICD-10-CM | POA: Diagnosis not present

## 2023-01-09 DIAGNOSIS — R9431 Abnormal electrocardiogram [ECG] [EKG]: Secondary | ICD-10-CM | POA: Diagnosis not present

## 2023-01-09 DIAGNOSIS — Z794 Long term (current) use of insulin: Secondary | ICD-10-CM | POA: Insufficient documentation

## 2023-01-09 DIAGNOSIS — F17210 Nicotine dependence, cigarettes, uncomplicated: Secondary | ICD-10-CM | POA: Diagnosis not present

## 2023-01-09 DIAGNOSIS — I5022 Chronic systolic (congestive) heart failure: Secondary | ICD-10-CM | POA: Diagnosis present

## 2023-01-09 DIAGNOSIS — G4733 Obstructive sleep apnea (adult) (pediatric): Secondary | ICD-10-CM | POA: Insufficient documentation

## 2023-01-09 DIAGNOSIS — Z7985 Long-term (current) use of injectable non-insulin antidiabetic drugs: Secondary | ICD-10-CM | POA: Insufficient documentation

## 2023-01-09 DIAGNOSIS — Z91199 Patient's noncompliance with other medical treatment and regimen due to unspecified reason: Secondary | ICD-10-CM | POA: Diagnosis not present

## 2023-01-09 DIAGNOSIS — Z79899 Other long term (current) drug therapy: Secondary | ICD-10-CM | POA: Insufficient documentation

## 2023-01-09 DIAGNOSIS — Z7984 Long term (current) use of oral hypoglycemic drugs: Secondary | ICD-10-CM | POA: Diagnosis not present

## 2023-01-09 DIAGNOSIS — I2510 Atherosclerotic heart disease of native coronary artery without angina pectoris: Secondary | ICD-10-CM | POA: Insufficient documentation

## 2023-01-09 LAB — CBC
HCT: 32 % — ABNORMAL LOW (ref 39.0–52.0)
Hemoglobin: 9.8 g/dL — ABNORMAL LOW (ref 13.0–17.0)
MCH: 24.9 pg — ABNORMAL LOW (ref 26.0–34.0)
MCHC: 30.6 g/dL (ref 30.0–36.0)
MCV: 81.2 fL (ref 80.0–100.0)
Platelets: 208 10*3/uL (ref 150–400)
RBC: 3.94 MIL/uL — ABNORMAL LOW (ref 4.22–5.81)
RDW: 13.2 % (ref 11.5–15.5)
WBC: 5 10*3/uL (ref 4.0–10.5)
nRBC: 0 % (ref 0.0–0.2)

## 2023-01-09 LAB — COMPREHENSIVE METABOLIC PANEL
ALT: 13 U/L (ref 0–44)
AST: 14 U/L — ABNORMAL LOW (ref 15–41)
Albumin: 3.6 g/dL (ref 3.5–5.0)
Alkaline Phosphatase: 63 U/L (ref 38–126)
Anion gap: 14 (ref 5–15)
BUN: 30 mg/dL — ABNORMAL HIGH (ref 8–23)
CO2: 27 mmol/L (ref 22–32)
Calcium: 8.8 mg/dL — ABNORMAL LOW (ref 8.9–10.3)
Chloride: 101 mmol/L (ref 98–111)
Creatinine, Ser: 2.87 mg/dL — ABNORMAL HIGH (ref 0.61–1.24)
GFR, Estimated: 23 mL/min — ABNORMAL LOW (ref >60)
Glucose, Bld: 96 mg/dL (ref 70–99)
Potassium: 3.6 mmol/L (ref 3.5–5.1)
Sodium: 142 mmol/L (ref 135–145)
Total Bilirubin: 0.5 mg/dL (ref 0.3–1.2)
Total Protein: 6.9 g/dL (ref 6.5–8.1)

## 2023-01-09 NOTE — Patient Instructions (Signed)
Medication Changes:  STOP IVABRADINE (CORLANOR)  STOP: AMLODIPINE (NORVASC)  Lab Work:  Labs done today, your results will be available in MyChart, we will contact you for abnormal readings.   Testing/Procedures:  Your physician has requested that you have an echocardiogram. Echocardiography is a painless test that uses sound waves to create images of your heart. It provides your doctor with information about the size and shape of your heart and how well your heart's chambers and valves are working. This procedure takes approximately one hour. There are no restrictions for this procedure. Please do NOT wear cologne, perfume, aftershave, or lotions (deodorant is allowed). Please arrive 15 minutes prior to your appointment time.   Follow-Up in: 9 MONTHS AROUND MARCH 2025- PLEASE CALL THE OFFICE TO SCHEDULE THIS AROUND JANUARY 2025- NUMBER TO THE OFFICE IS [phone] OPTION 2   At the [facility], you and your health needs are our priority. We have a designated team specialized in the treatment of Heart Failure. This Care Team includes your primary Heart Failure Specialized Cardiologist (physician), Advanced Practice Providers (APPs- Physician Assistants and Nurse Practitioners), and Pharmacist who all work together to provide you with the care you need, when you need it.   You may see any of the following providers on your designated Care Team at your next follow up:  Dr. Arvilla Meres Dr. Marca Ancona Dr. Marcos Eke, NP Robbie Lis, [place] [facility] [place], [place] Brynda Peon, NP Karle Plumber, PharmD   Please be sure to bring in all your medications bottles to every appointment.   Need to Contact us:  If you have any questions or concerns before your next appointment please send [place] a message through [place] or call our office at [phone].    TO LEAVE A MESSAGE FOR THE NURSE SELECT OPTION 2, PLEASE LEAVE A MESSAGE  INCLUDING: YOUR NAME DATE OF BIRTH CALL BACK NUMBER REASON FOR CALL**this is important as we prioritize the call backs  YOU WILL RECEIVE A CALL BACK THE SAME DAY AS LONG AS YOU CALL BEFORE 4:00 PM

## 2023-01-09 NOTE — Progress Notes (Signed)
[facility] Note   PCP: Carlynn Purl HF MD: Dr Gala Romney  HPI: Eddie [name] is a 72 y.o. male  with h/o DM2, CAD s/p NSTEMI with LAD stent in 3/17, CVA, chronic systolic HF with previous EF 15-20%.   Had multiple admissions in May and June 2017 in the setting of decompensated CHF and EColi bacteremia. Readmitted in June 2017 with acute respiratory failure requiring intubation in setting of severe HTN and ADHF. Treated with milrinone which was eventually weaned off.     Follow up 4/23, stable NYHA II-III and volume OK. Walking 2 miles/day. Labs and repeat echo arranged.  Today he returns for HF follow up with his wife. Overall feeling fine. He has SOB if he lifts a heavy load, otherwise breathing is fine. Smokes 3-4 cigarettes/day. He has rare atypical CP, not exertional.  Happens at night when he is laying down, had some reflux recently. Denies palpitations, dizziness, edema, or PND/Orthopnea. Appetite ok. No fever or chills. Weight at home 154 pounds. Taking all medications. Wears CPAP.  Cardiac Studies  Echo (2/22): EF 50-55% Echo (11/20): EF 40-45%  Echo (12/19): EF 30-35%  Echo (6/18): EF 40%.   RV ok.  Echo (11/17): EF 20-25%.Cannot exclude small  laminar apical thrombus. No mobile or protuberant thrombus is  seen. Echo (5/15): EF 15-20%  Review of systems complete and found to be negative unless listed in HPI.    Past Medical History:  Diagnosis Date   Blindness of left eye    CAD (coronary artery disease)    a. STEMI 09/2015 w/ DES to Prox LAD   CVA (cerebral infarction)    a. 09/2015: acute small right frontal lobe infarct   Diabetes (HCC)    Hypertension    Ischemic cardiomyopathy    OSA (obstructive sleep apnea) 07/06/2017   Mild OSA with AHI of 8.2/hr with hypoxemia as low as 76% now on CPAP at 11cm H2O.   PUD (peptic ulcer disease)    ST elevation myocardial infarction involving left anterior descending (LAD) coronary artery (HCC) 09/28/2015    Current Outpatient Medications  Medication Sig Dispense Refill   Accu-Chek FastClix Lancets MISC Use to check blood sugar 1 time a day 102 each 3   Acetaminophen 325 MG CAPS Take 2 tablets by mouth every 4 (four) hours as needed.     albuterol (VENTOLIN HFA) 108 (90 Base) MCG/ACT inhaler Inhale 2 puffs into the lungs every 6 (six) hours as needed for wheezing or shortness of breath. 8.5 g 2   amLODipine (NORVASC) 5 MG tablet Take 1 tablet (5 mg total) by mouth daily. 90 tablet 3   aspirin EC 81 MG tablet Take 1 tablet (81 mg total) by mouth daily. 90 tablet 3   atorvastatin (LIPITOR) 80 MG tablet Take 1 tablet (80 mg total) by mouth daily. 90 tablet 3   Blood Glucose Monitoring Suppl (ACCU-CHEK GUIDE) w/Device KIT Use as directed 1 kit 0   carvedilol (COREG) 25 MG tablet Take 1 tablet (25 mg total) by mouth 2 (two) times daily. 180 tablet 3   clopidogrel (PLAVIX) 75 MG tablet Take 1 tablet (75 mg total) by mouth daily. 90 tablet 3   dorzolamide-timolol (COSOPT) 2-0.5 % ophthalmic solution Place 1 drop into both eyes 2 (two) times daily. 15 mL 12   empagliflozin (JARDIANCE) 10 MG TABS tablet Take 1 tablet (10 mg total) by mouth daily before breakfast. 90 tablet 3   eplerenone (INSPRA) 25 MG  tablet Take 1/2  tablet (12.5 mg total) by mouth daily. 45 tablet 3   furosemide (LASIX) 40 MG tablet Take 1 tablet (40 mg total) by mouth as needed. Take 1 tablet (40 mg total) by mouth as needed (for weight gain of 3 lbs in a day or 5 lbs in a week). 30 tablet 3   glucose blood (ACCU-CHEK GUIDE) test strip USE TO CHECK BLOOD SUGAR ONCE DAILY 100 strip 12   Insulin Pen Needle (UNIFINE PENTIPS) 31G X 5 MM MISC Use daily as directed 100 each 5   ivabradine (CORLANOR) 5 MG TABS tablet Take 1 tablet (5 mg total) by mouth 2 (two) times daily with a meal. 180 tablet 5   Lancets Misc. (ACCU-CHEK FASTCLIX LANCET) KIT Use as directed once daily 1 kit 1   latanoprost (XALATAN) 0.005 % ophthalmic solution Place 1 drop  into both eyes every evening. 7.5 mL 12   liraglutide (VICTOZA) 18 MG/3ML SOPN Inject 0.6 mg into the skin daily. 9 mL 3   metFORMIN (GLUCOPHAGE) 500 MG tablet Take 1 tablet (500 mg total) by mouth 2 (two) times daily with a meal. 180 tablet 3   nitroGLYCERIN (NITROSTAT) 0.4 MG SL tablet Use one pill every 5 minutes X 3 if needed for chest pain. Contact MD if pain does not improve 30 tablet 0   sacubitril-valsartan (ENTRESTO) 49-51 MG Take 1 tablet by mouth 2 (two) times daily. 60 tablet 6   vitamin B-12 (CYANOCOBALAMIN) 1000 MCG tablet Take 1 tablet (1,000 mcg total) by mouth daily. 90 tablet 1   No current facility-administered medications for this encounter.   Allergies  Allergen Reactions   Bidil [Isosorb Dinitrate-Hydralazine] Other (See Comments)    Patient became lightheaded with 1 tablet tid.     Social History   Socioeconomic History   Marital status: Married    Spouse name: Not on file   Number of children: Not on file   Years of education: Not on file   Highest education level: Not on file  Occupational History   Occupation: Engineer, maintenance (IT)    Employer: [employer]  Tobacco Use   Smoking status: Some Days    Packs/day: .25    Types: Cigarettes   Smokeless tobacco: Never  Vaping Use   Vaping Use: Never used  Substance and Sexual Activity   Alcohol use: No    Alcohol/week: 0.0 standard drinks of alcohol   Drug use: No   Sexual activity: Not on file  Other Topics Concern   Not on file  Social History Narrative   Not on file   Social Determinants of Health   Financial Resource Strain: Not on file  Food Insecurity: Food Insecurity Present (02/20/2022)   Hunger Vital Sign    Worried About Running Out of Food in the Last Year: Sometimes true    Ran Out of Food in the Last Year: Sometimes true  Transportation Needs: Not on file  Physical Activity: Not on file  Stress: Not on file  Social Connections: Not on file  Intimate Partner Violence: Not on file    Family History  Problem Relation Age of Onset   Diabetes Mother    Cancer Neg Hx     BP (!) 104/50   Pulse (!) 58   Wt 59.1 kg (130 lb 3.2 oz)   SpO2 100%   BMI 21.01 kg/m   Wt Readings from Last 3 Encounters:  01/09/23 59.1 kg (130 lb 3.2 oz)  08/24/22 61.2 kg (135 lb)  01/12/22 62.9 kg (138 lb 11.2 oz)    PHYSICAL EXAM: General:  Thin. NAD. No resp difficulty, walked into clinic HEENT: normal Neck: supple. no JVD. Carotids 2+ bilat; no bruits. No lymphadenopathy or thryomegaly appreciated. Cor: PMI nondisplaced. Regular rate & rhythm. No rubs, gallops or murmurs. Lungs: clear decreased throughout Abdomen: soft, nontender, nondistended. No hepatosplenomegaly. No bruits or masses. Good bowel sounds. Extremities: no cyanosis, clubbing, rash, edema Neuro: alert & orientedx3, cranial nerves grossly intact. moves all 4 extremities w/o difficulty. Affect pleasant  ECG (personally reviewed): NSR 60 bpm  ASSESSMENT & PLAN: 1. Chronic systolic HF with recovered EF :  - ICM May 2017 EF 15-20% and 12/17 EF 25-30%.  - Echo (6/18): EF improved 40% - Echo (12/19): EF 30-35%. - Echo (11/20): EF 40-45% - Echo (2/22): EF 50-55% - Stable NYHA II, volume looks OK. - HR 60, stop ivabradine  - Continue Lasix 40 mg PRN - Continue Entresto 49/51 mg bid  - Continue Coreg 25 mg bid - Continue Inspra 12.5 mg daily - Continue Jardiance 10 mg daily.  - Intolerant to Bidil in the past with dizziness and headaches.  - Due for repeat echo - Labs today.   2. CAD: s/p DES to proximal LAD in 09/2015  - No s/s angina - Continue ASA, Plavix and atorvastatin.  - LDL at goal 56 (2/23)  3. HTN - Blood pressure runs on low side.  - Stop amlodipine  4. 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  - Continue DAPT   5. DMII - Per PCP. - Continue Jardiance 10 mg daily - No change  6. OSA  - Continue CPAP  7.  Tobacco use - Smoking 3 cigs/day.  - Discussed  smoking cessation  8. CKD Stage IIIb - Baseline SCr 1.8-2.2 - Continue Jardiance - Labs today  Arvilla Meres, MD  1:37 PM

## 2023-01-09 NOTE — Progress Notes (Signed)
  Heart and Vascular Care Navigation  01/09/2023  Eddie [name] 27-Jun-1951 [id number]  Reason for Referral: food insecurity/ food stamp questions   Engaged with patient face to face for initial visit for Heart and Vascular Care Coordination.                                                                                                   Assessment:     Pt and wife brought notice from food stamps that they were only to get $23 a month when they had been previously getting over $200.  CSW pointed out case worker listed on form and suggest they call them to inquire about change in amount.  They confirm that there have been no changes to income other than annual slight increase to SSA income so likely explanation is that they did not report household income or expenses the same as they did on previous application and this is affecting the amount awarded.   They will follow up with [employer]  Pt and wife endorse utilizing food pantries when needed but worried about types of food provided.  CSW discussed how to reduce sodium in canned foods by rinsing and provided with updated list of pantries including those who hand out fresh food.  CSW provided with Heart and Vascular food bag.                              HRT/VAS Care Coordination     Home Assistive Devices/Equipment Cane (specify quad or straight)   DME Agency [facility] (P)    HH Agency NA       Social History:                                                                             SDOH Screenings   Food Insecurity: Food Insecurity Present (01/09/2023)  Alcohol Screen: Low Risk  (07/13/2022)  Depression (PHQ2-9): Low Risk  (07/13/2022)  Tobacco Use: High Risk (01/09/2023)    SDOH Interventions: Financial Resources:    Pt and pt wife receive retirement income  Food Insecurity:  Food Insecurity Interventions: Assist with [employer] Application, Other (Comment) (Heart and Vascular food bag and food pantry referral)  Housing  Insecurity:   None reported  Transportation:   Private transportation     Follow-up plan:    Pt and wife to reach out to [facility] regarding lowered benefit amount- they will reach out to CSW if in need of further assistance.  Burna Sis, LCSW Clinical Social Worker [facility] Desk#: [phone] Cell#: [phone]

## 2023-01-11 ENCOUNTER — Telehealth (HOSPITAL_COMMUNITY): Payer: Self-pay | Admitting: Cardiology

## 2023-01-11 DIAGNOSIS — I5022 Chronic systolic (congestive) heart failure: Secondary | ICD-10-CM

## 2023-01-11 NOTE — Telephone Encounter (Signed)
Pt aware repeat labs 6/27

## 2023-01-11 NOTE — Telephone Encounter (Signed)
-----   Message from Dolores Patty, MD sent at 01/10/2023 12:00 PM EDT ----- Scr back up. Recheck BMET next week please

## 2023-01-15 ENCOUNTER — Other Ambulatory Visit (HOSPITAL_COMMUNITY): Payer: Self-pay

## 2023-01-15 ENCOUNTER — Other Ambulatory Visit: Payer: Self-pay

## 2023-01-16 ENCOUNTER — Ambulatory Visit (INDEPENDENT_AMBULATORY_CARE_PROVIDER_SITE_OTHER): Payer: 59 | Admitting: Podiatry

## 2023-01-16 ENCOUNTER — Encounter: Payer: Self-pay | Admitting: Podiatry

## 2023-01-16 ENCOUNTER — Other Ambulatory Visit (HOSPITAL_COMMUNITY): Payer: Self-pay

## 2023-01-16 VITALS — BP 97/62 | HR 79

## 2023-01-16 DIAGNOSIS — E1140 Type 2 diabetes mellitus with diabetic neuropathy, unspecified: Secondary | ICD-10-CM | POA: Diagnosis not present

## 2023-01-16 DIAGNOSIS — M79675 Pain in left toe(s): Secondary | ICD-10-CM | POA: Diagnosis not present

## 2023-01-16 DIAGNOSIS — M79674 Pain in right toe(s): Secondary | ICD-10-CM

## 2023-01-16 DIAGNOSIS — I739 Peripheral vascular disease, unspecified: Secondary | ICD-10-CM | POA: Diagnosis not present

## 2023-01-16 DIAGNOSIS — B351 Tinea unguium: Secondary | ICD-10-CM | POA: Diagnosis not present

## 2023-01-16 DIAGNOSIS — Z794 Long term (current) use of insulin: Secondary | ICD-10-CM

## 2023-01-18 ENCOUNTER — Ambulatory Visit (HOSPITAL_COMMUNITY)
Admission: RE | Admit: 2023-01-18 | Discharge: 2023-01-18 | Disposition: A | Discharge status: Home / Self Care | Payer: 59 | Source: Ambulatory Visit | Attending: Cardiology | Admitting: Cardiology

## 2023-01-18 DIAGNOSIS — I5022 Chronic systolic (congestive) heart failure: Secondary | ICD-10-CM | POA: Insufficient documentation

## 2023-01-18 LAB — BASIC METABOLIC PANEL
Anion gap: 8 (ref 5–15)
BUN: 30 mg/dL — ABNORMAL HIGH (ref 8–23)
CO2: 27 mmol/L (ref 22–32)
Calcium: 8.5 mg/dL — ABNORMAL LOW (ref 8.9–10.3)
Chloride: 103 mmol/L (ref 98–111)
Creatinine, Ser: 2.09 mg/dL — ABNORMAL HIGH (ref 0.61–1.24)
GFR, Estimated: 33 mL/min — ABNORMAL LOW (ref >60)
Glucose, Bld: 121 mg/dL — ABNORMAL HIGH (ref 70–99)
Potassium: 3.6 mmol/L (ref 3.5–5.1)
Sodium: 138 mmol/L (ref 135–145)

## 2023-01-21 NOTE — Progress Notes (Signed)
  Subjective:  Patient ID: Eddie [name], male    DOB: 02-Jun-1951,  MRN: [id number]  Eddie [name] presents to clinic today for at risk footcare. Patient has h/o diabetes, neuropathy and PAD and is seen for  and painful elongated mycotic toenails 1-5 bilaterally which are tender when wearing enclosed shoe gear. Pain is relieved with periodic professional debridement.  Chief Complaint  Patient presents with   Diabetes    "She's doing my nails and things today."  Dr. Harmon Dun - 12/23, 103 mg/dl    New problem(s): None.   PCP is Gust Rung, DO.  Allergies  Allergen Reactions   Bidil [Isosorb Dinitrate-Hydralazine] Other (See Comments)    Patient became lightheaded with 1 tablet tid.     Review of Systems: Negative except as noted in the HPI.  Objective: No changes noted in today's physical examination. Vitals:   01/16/23 0929  BP: 97/62  Pulse: 79   Eddie [name] is a pleasant 72 y.o. male thin build in NAD. AAO x 3.  Vascular Examination: CFT <4 seconds b/l. DP/PT pulses diminished b/l. Digital hair absent. Skin temperature gradient warm to cool b/l. No ischemia or gangrene. No cyanosis or clubbing noted b/l. No edema noted b/l LE.   Neurological Examination: Pt has subjective symptoms of neuropathy. Protective sensation diminished with 10g monofilament b/l.  Dermatological Examination: Pedal skin thin, shiny and atrophic b/l. No open wounds. No interdigital macerations.   Toenails 1-5 b/l thick, discolored, elongated with subungual debris and pain on dorsal palpation.   No hyperkeratotic nor porokeratotic lesions present on today's visit.  Musculoskeletal Examination: Muscle strength 5/5 to b/l LE. Hammertoe deformity noted 2-5 b/l. Utilizes cane for ambulation assistance.  Radiographs: None  Last A1c:      Latest Ref Rng & Units 07/13/2022   11:02 AM  Hemoglobin A1C  Hemoglobin-A1c 4.0 - 5.6 % 6.0    Assessment/Plan: 1. Pain due to onychomycosis of  toenails of both feet   2. PAD (peripheral artery disease) (HCC)   3. Type 2 diabetes mellitus with diabetic neuropathy, with long-term current use of insulin (HCC)    -Patient's family member present. All questions/concerns addressed on today's visit. -Consent given for treatment as described below: -Examined patient. -Continue foot and shoe inspections daily. Monitor blood glucose per PCP/Endocrinologist's recommendations. -Patient to continue soft, supportive shoe gear daily. -Toenails 1-5 b/l were debrided in length and girth with sterile nail nippers and dremel without iatrogenic bleeding.  -Patient/POA to call should there be question/concern in the interim.   Return in about 3 months (around 04/18/2023).  Freddie Breech, DPM

## 2023-01-24 ENCOUNTER — Ambulatory Visit (HOSPITAL_COMMUNITY)
Admission: RE | Admit: 2023-01-24 | Discharge: 2023-01-24 | Disposition: A | Discharge status: Home / Self Care | Payer: 59 | Source: Ambulatory Visit | Attending: Internal Medicine | Admitting: Internal Medicine

## 2023-01-24 DIAGNOSIS — G4730 Sleep apnea, unspecified: Secondary | ICD-10-CM | POA: Diagnosis not present

## 2023-01-24 DIAGNOSIS — I110 Hypertensive heart disease with heart failure: Secondary | ICD-10-CM | POA: Diagnosis not present

## 2023-01-24 DIAGNOSIS — I5042 Chronic combined systolic (congestive) and diastolic (congestive) heart failure: Secondary | ICD-10-CM | POA: Insufficient documentation

## 2023-01-24 DIAGNOSIS — J449 Chronic obstructive pulmonary disease, unspecified: Secondary | ICD-10-CM | POA: Insufficient documentation

## 2023-01-24 DIAGNOSIS — E119 Type 2 diabetes mellitus without complications: Secondary | ICD-10-CM | POA: Diagnosis not present

## 2023-01-25 LAB — ECHOCARDIOGRAM COMPLETE
Calc EF: 44.9 %
S' Lateral: 3.6 cm
Single Plane A2C EF: 46.7 %
Single Plane A4C EF: 44.3 %

## 2023-01-31 ENCOUNTER — Other Ambulatory Visit (HOSPITAL_COMMUNITY): Payer: Self-pay

## 2023-02-01 ENCOUNTER — Encounter: Payer: Self-pay | Admitting: Internal Medicine

## 2023-02-01 ENCOUNTER — Ambulatory Visit (INDEPENDENT_AMBULATORY_CARE_PROVIDER_SITE_OTHER): Payer: 59 | Admitting: Internal Medicine

## 2023-02-01 ENCOUNTER — Other Ambulatory Visit: Payer: Self-pay

## 2023-02-01 VITALS — BP 106/59 | HR 70 | Temp 97.8°F | Ht 66.0 in | Wt 125.6 lb

## 2023-02-01 DIAGNOSIS — E1140 Type 2 diabetes mellitus with diabetic neuropathy, unspecified: Secondary | ICD-10-CM

## 2023-02-01 DIAGNOSIS — E1122 Type 2 diabetes mellitus with diabetic chronic kidney disease: Secondary | ICD-10-CM | POA: Diagnosis not present

## 2023-02-01 DIAGNOSIS — F17210 Nicotine dependence, cigarettes, uncomplicated: Secondary | ICD-10-CM

## 2023-02-01 DIAGNOSIS — J432 Centrilobular emphysema: Secondary | ICD-10-CM

## 2023-02-01 DIAGNOSIS — G2581 Restless legs syndrome: Secondary | ICD-10-CM

## 2023-02-01 DIAGNOSIS — J449 Chronic obstructive pulmonary disease, unspecified: Secondary | ICD-10-CM

## 2023-02-01 DIAGNOSIS — N1832 Chronic kidney disease, stage 3b: Secondary | ICD-10-CM

## 2023-02-01 DIAGNOSIS — Z7984 Long term (current) use of oral hypoglycemic drugs: Secondary | ICD-10-CM

## 2023-02-01 LAB — POCT GLYCOSYLATED HEMOGLOBIN (HGB A1C): Hemoglobin A1C: 5.5 % (ref 4.0–5.6)

## 2023-02-01 LAB — GLUCOSE, CAPILLARY: Glucose-Capillary: 106 mg/dL — ABNORMAL HIGH (ref 70–99)

## 2023-02-02 LAB — MICROALBUMIN / CREATININE URINE RATIO
Creatinine, Urine: 31.3 mg/dL
Microalb/Creat Ratio: 30 mg/g creat — ABNORMAL HIGH (ref 0–29)
Microalbumin, Urine: 9.4 ug/mL

## 2023-02-02 NOTE — Progress Notes (Signed)
Established Patient Office Visit  Subjective   Patient ID: Eddie [name], male    DOB: 08/10/1950  Age: 72 y.o. MRN: [id number]  Chief Complaint  Patient presents with   Check-up Visit    Diabetes.   Eddie [name] is here to follow-up his diabetes and congestive heart failure.  He has been doing very well overall.  His blood pressure was noted to be on the low side on initial check he was asymptomatic.  It has been very hot weather outside however he has tried to stay well-hydrated.  He is taking all of his medications as directed.  He was able to drink a bottle of water in the office with improvement of his blood pressure.  He has been checking his blood sugars once a day every morning he has had no low blood sugars with review of his downloaded glucometer.  Most blood sugars are within the normal or prediabetic range.  He has been following up with podiatry and Dr. Gala Romney of heart failure.  He follows with Dr. Durwin Nora of vascular surgery for his PAD.  He underwent a repeat echocardiogram last week with a EF of 45 to 50%.     Objective:     BP (!) 106/59 (BP Location: Left Arm, Patient Position: Sitting, Cuff Size: Normal)   Pulse 70   Temp 97.8 F (36.6 C) (Oral)   Ht 5\' 6"  (1.676 m)   Wt 125 lb 9.6 oz (57 kg)   SpO2 100% Comment: RA  BMI 20.27 kg/m  BP Readings from Last 3 Encounters:  02/01/23 (!) 106/59  01/16/23 97/62  01/09/23 (!) 104/50   Wt Readings from Last 3 Encounters:  02/01/23 125 lb 9.6 oz (57 kg)  01/09/23 130 lb 3.2 oz (59.1 kg)  08/24/22 135 lb (61.2 kg)      Physical Exam Vitals and nursing note reviewed.  Cardiovascular:     Rate and Rhythm: Normal rate and regular rhythm.  Pulmonary:     Effort: Pulmonary effort is normal.     Breath sounds: Normal breath sounds. No wheezing.  Psychiatric:        Mood and Affect: Mood normal.        Behavior: Behavior normal.      Results for orders placed or performed in visit on 02/01/23  Glucose,  capillary  Result Value Ref Range   Glucose-Capillary 106 (H) 70 - 99 mg/dL  POC Hbg N8G  Result Value Ref Range   Hemoglobin A1C 5.5 4.0 - 5.6 %   HbA1c POC (<> result, manual entry)     HbA1c, POC (prediabetic range)     HbA1c, POC (controlled diabetic range)      Last CBC Lab Results  Component Value Date   WBC 5.0 01/09/2023   HGB 9.8 (L) 01/09/2023   HCT 32.0 (L) 01/09/2023   MCV 81.2 01/09/2023   MCH 24.9 (L) 01/09/2023   RDW 13.2 01/09/2023   PLT 208 01/09/2023   Last metabolic panel Lab Results  Component Value Date   GLUCOSE 121 (H) 01/18/2023   NA 138 01/18/2023   K 3.6 01/18/2023   CL 103 01/18/2023   CO2 27 01/18/2023   BUN 30 (H) 01/18/2023   CREATININE 2.09 (H) 01/18/2023   GFRNONAA 33 (L) 01/18/2023   CALCIUM 8.5 (L) 01/18/2023   PHOS 3.7 01/07/2016   PROT 6.9 01/09/2023   ALBUMIN 3.6 01/09/2023   BILITOT 0.5 01/09/2023   ALKPHOS 63 01/09/2023   AST 14 (L)  01/09/2023   ALT 13 01/09/2023   ANIONGAP 8 01/18/2023   Last hemoglobin A1c Lab Results  Component Value Date   HGBA1C 5.5 02/01/2023      The ASCVD Risk score (Arnett DK, et al., 2019) failed to calculate for the following reasons:   The patient has a prior MI or stroke diagnosis    Assessment & Plan:   Problem List Items Addressed This Visit       Respiratory   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  Centrilobular emphysema well-controlled with albuterol as needed.        Endocrine   Controlled type 2 diabetes mellitus with diabetic neuropathy, without long-term current use of insulin (HCC) - Primary (Chronic)    His diabetes is very well-controlled with an A1c of 5.5% he is on metformin, Victoza and Jardiance.  Given his coronary artery disease and congestive heart failure Victoza and Jardiance are excellent medications to continue.  I discussed with him potentially decreasing metformin however he  says he has no side effects from this and likes that his A1c is in the normal range in this case we will continue all medications at current dosage.      Relevant Orders   POC Hbg A1C (Completed)   Microalbumin / Creatinine Urine Ratio     Genitourinary   CKD (chronic kidney disease) stage 3, GFR 30-59 ml/min (HCC) (Chronic)    Will check urine microalbumin today.  He is on ARB and SGLT2 inhibitor.        Other   Restless leg    Still having some occasional restless leg syndromes but overall fairly mild and resolves with occasional use of Tylenol.  Given lack of distressing symptoms we will hold off any direct treatment.       Return in about 6 months (around 08/04/2023).    Gust Rung, DO

## 2023-02-02 NOTE — Assessment & Plan Note (Signed)
Still having some occasional restless leg syndromes but overall fairly mild and resolves with occasional use of Tylenol.  Given lack of distressing symptoms we will hold off any direct treatment.

## 2023-02-02 NOTE — Assessment & Plan Note (Signed)
His diabetes is very well-controlled with an A1c of 5.5% he is on metformin, Victoza and Jardiance.  Given his coronary artery disease and congestive heart failure Victoza and Jardiance are excellent medications to continue.  I discussed with him potentially decreasing metformin however he says he has no side effects from this and likes that his A1c is in the normal range in this case we will continue all medications at current dosage.

## 2023-02-02 NOTE — Assessment & Plan Note (Signed)
No wheezing on exam overall feeling well.  Centrilobular emphysema well-controlled with albuterol as needed.

## 2023-02-02 NOTE — Assessment & Plan Note (Signed)
Will check urine microalbumin today.  He is on ARB and SGLT2 inhibitor.

## 2023-02-08 ENCOUNTER — Other Ambulatory Visit: Payer: Self-pay

## 2023-02-14 ENCOUNTER — Other Ambulatory Visit: Payer: Self-pay

## 2023-02-14 ENCOUNTER — Other Ambulatory Visit (HOSPITAL_COMMUNITY): Payer: Self-pay

## 2023-02-14 DIAGNOSIS — I2510 Atherosclerotic heart disease of native coronary artery without angina pectoris: Secondary | ICD-10-CM

## 2023-02-14 MED ORDER — ATORVASTATIN CALCIUM 80 MG PO TABS
80.0000 mg | ORAL_TABLET | Freq: Every day | ORAL | 3 refills | Status: DC
Start: 2023-02-14 — End: 2024-02-13
  Filled 2023-02-14: qty 90, 90d supply, fill #0
  Filled 2023-05-10: qty 90, 90d supply, fill #1
  Filled 2023-08-08: qty 90, 90d supply, fill #2
  Filled 2023-11-05: qty 90, 90d supply, fill #3

## 2023-02-14 NOTE — Telephone Encounter (Signed)
Patient is out of medication

## 2023-02-16 ENCOUNTER — Other Ambulatory Visit (HOSPITAL_COMMUNITY): Payer: Self-pay

## 2023-02-16 ENCOUNTER — Other Ambulatory Visit: Payer: Self-pay | Admitting: *Deleted

## 2023-02-16 DIAGNOSIS — I5042 Chronic combined systolic (congestive) and diastolic (congestive) heart failure: Secondary | ICD-10-CM

## 2023-02-16 MED ORDER — CARVEDILOL 25 MG PO TABS
25.0000 mg | ORAL_TABLET | Freq: Two times a day (BID) | ORAL | 3 refills | Status: DC
Start: 2023-02-16 — End: 2024-02-13
  Filled 2023-02-16: qty 180, 90d supply, fill #0
  Filled 2023-05-10: qty 180, 90d supply, fill #1
  Filled 2023-08-11: qty 180, 90d supply, fill #2
  Filled 2023-11-05: qty 180, 90d supply, fill #3

## 2023-02-22 ENCOUNTER — Other Ambulatory Visit: Payer: Self-pay

## 2023-02-26 ENCOUNTER — Other Ambulatory Visit: Payer: Self-pay

## 2023-02-26 ENCOUNTER — Other Ambulatory Visit (HOSPITAL_COMMUNITY): Payer: Self-pay

## 2023-02-26 MED ORDER — METFORMIN HCL 500 MG PO TABS
500.0000 mg | ORAL_TABLET | Freq: Two times a day (BID) | ORAL | 3 refills | Status: DC
  Filled 2023-02-26: qty 180, 90d supply, fill #0
  Filled 2023-05-20: qty 180, 90d supply, fill #1
  Filled 2023-08-11: qty 180, 90d supply, fill #2
  Filled 2023-11-18: qty 180, 90d supply, fill #3

## 2023-02-26 NOTE — Telephone Encounter (Signed)
Patient is currently out of med.

## 2023-03-05 ENCOUNTER — Other Ambulatory Visit: Payer: Self-pay

## 2023-03-05 ENCOUNTER — Other Ambulatory Visit (HOSPITAL_COMMUNITY): Payer: Self-pay

## 2023-03-07 ENCOUNTER — Other Ambulatory Visit (HOSPITAL_COMMUNITY): Payer: Self-pay

## 2023-03-07 ENCOUNTER — Other Ambulatory Visit: Payer: Self-pay | Admitting: Internal Medicine

## 2023-03-07 NOTE — Telephone Encounter (Signed)
I called and spoke with Bruna Potter, I was on the fence about discontinuing Victoza at last visit with his A1c at 5.5%.  I told him I think it would be best to just finish he supply and not switch to a new GLP1ra.  We can always add this back if needed at next vsiit.

## 2023-03-14 ENCOUNTER — Other Ambulatory Visit (HOSPITAL_COMMUNITY): Payer: Self-pay | Admitting: Internal Medicine

## 2023-03-15 ENCOUNTER — Other Ambulatory Visit (HOSPITAL_COMMUNITY): Payer: Self-pay

## 2023-03-15 MED ORDER — FUROSEMIDE 40 MG PO TABS
40.0000 mg | ORAL_TABLET | ORAL | 3 refills | Status: DC | PRN
  Filled 2023-03-15: qty 30, 30d supply, fill #0
  Filled 2023-04-10: qty 30, 30d supply, fill #1
  Filled 2023-05-10: qty 30, 30d supply, fill #2
  Filled 2023-06-09: qty 30, 30d supply, fill #3

## 2023-03-28 ENCOUNTER — Other Ambulatory Visit (HOSPITAL_COMMUNITY): Payer: Self-pay

## 2023-04-17 ENCOUNTER — Other Ambulatory Visit: Payer: Self-pay

## 2023-04-18 ENCOUNTER — Other Ambulatory Visit (HOSPITAL_COMMUNITY): Payer: Self-pay

## 2023-04-19 ENCOUNTER — Other Ambulatory Visit: Payer: Self-pay

## 2023-04-19 ENCOUNTER — Other Ambulatory Visit (HOSPITAL_COMMUNITY): Payer: Self-pay

## 2023-04-19 DIAGNOSIS — E1140 Type 2 diabetes mellitus with diabetic neuropathy, unspecified: Secondary | ICD-10-CM

## 2023-04-19 MED ORDER — ACCU-CHEK FASTCLIX LANCET KIT
PACK | 1 refills | Status: AC
Start: 2023-04-19 — End: ?
  Filled 2023-04-19 (×2): qty 1, 30d supply, fill #0

## 2023-04-20 ENCOUNTER — Other Ambulatory Visit (HOSPITAL_COMMUNITY): Payer: Self-pay

## 2023-04-23 ENCOUNTER — Other Ambulatory Visit (HOSPITAL_COMMUNITY): Payer: Self-pay

## 2023-04-23 ENCOUNTER — Other Ambulatory Visit: Payer: Self-pay | Admitting: Internal Medicine

## 2023-04-23 DIAGNOSIS — E1140 Type 2 diabetes mellitus with diabetic neuropathy, unspecified: Secondary | ICD-10-CM

## 2023-04-23 MED ORDER — ACCU-CHEK FASTCLIX LANCETS MISC
1.0000 | Freq: Every day | 3 refills | Status: DC
Start: 2023-04-23 — End: 2024-05-26
  Filled 2023-04-23: qty 102, 90d supply, fill #0
  Filled 2023-07-21: qty 102, 90d supply, fill #1
  Filled 2023-11-29: qty 102, 90d supply, fill #2
  Filled 2024-02-19: qty 102, 90d supply, fill #3

## 2023-04-25 ENCOUNTER — Encounter: Payer: Self-pay | Admitting: Podiatry

## 2023-04-25 ENCOUNTER — Ambulatory Visit (INDEPENDENT_AMBULATORY_CARE_PROVIDER_SITE_OTHER): Payer: 59 | Admitting: Podiatry

## 2023-04-25 DIAGNOSIS — Z794 Long term (current) use of insulin: Secondary | ICD-10-CM

## 2023-04-25 DIAGNOSIS — M79674 Pain in right toe(s): Secondary | ICD-10-CM

## 2023-04-25 DIAGNOSIS — E1140 Type 2 diabetes mellitus with diabetic neuropathy, unspecified: Secondary | ICD-10-CM | POA: Diagnosis not present

## 2023-04-25 DIAGNOSIS — I739 Peripheral vascular disease, unspecified: Secondary | ICD-10-CM

## 2023-04-25 DIAGNOSIS — B351 Tinea unguium: Secondary | ICD-10-CM

## 2023-04-25 DIAGNOSIS — M79675 Pain in left toe(s): Secondary | ICD-10-CM | POA: Diagnosis not present

## 2023-04-29 NOTE — Progress Notes (Signed)
Subjective:  Patient ID: Eddie [name], male    DOB: August 21, 1950,  MRN: [id number]  Eddie [name] presents to clinic today for: at risk footcare. Patient has h/o diabetes, neuropathy and PAD and is seen for  and painful thick toenails that are difficult to trim. Pain interferes with ambulation. Aggravating factors include wearing enclosed shoe gear. Pain is relieved with periodic professional debridement. He is accompanied by his wife on today's visit. Chief Complaint  Patient presents with   Diabetes    DFC BS-103 A1C- PCPV-01/2023    PCP is Gust Rung, DO.  Allergies  Allergen Reactions   Bidil [Isosorb Dinitrate-Hydralazine] Other (See Comments)    Patient became lightheaded with 1 tablet tid.     Review of Systems: Negative except as noted in the HPI.  Objective: No changes noted in today's physical examination. There were no vitals filed for this visit.  Eddie [name] is a pleasant 72 y.o. male in NAD. AAO x 3.  Vascular Examination: CFT <4 seconds b/l. DP pulses diminished b/l. PT pulses diminished b/l. Digital hair absent. Skin temperature gradient warm to cool b/l. No ischemia or gangrene. No cyanosis or clubbing noted b/l.    Neurological Examination: Pt has subjective symptoms of neuropathy. Protective sensation diminished with 10g monofilament b/l.  Dermatological Examination: Pedal skin thin, shiny and atrophic b/l. No open wounds. No interdigital macerations.   Toenails 1-5 b/l thick, discolored, elongated with subungual debris and pain on dorsal palpation.   No corns, calluses nor porokeratotic lesions noted.  Musculoskeletal Examination: Muscle strength 5/5 to all lower extremity muscle groups bilaterally. Hammertoe(s) noted to the 2-5 bilaterally and 2-5 left foot.  Radiographs: None  Last A1c:      Latest Ref Rng & Units 02/01/2023   10:45 AM 07/13/2022   11:02 AM  Hemoglobin A1C  Hemoglobin-A1c 4.0 - 5.6 % 5.5  6.0    Assessment/Plan: 1.  Pain due to onychomycosis of toenails of both feet   2. PAD (peripheral artery disease) (HCC)   3. Type 2 diabetes mellitus with diabetic neuropathy, with long-term current use of insulin (HCC)     -Patient was evaluated and treated. All patient's and/or POA's questions/concerns answered on today's visit. -Continue diabetic foot care principles: inspect feet daily, monitor glucose as recommended by PCP and/or Endocrinologist, and follow prescribed diet per PCP, Endocrinologist and/or dietician. -Continue supportive shoe gear daily. -Mycotic toenails 1-5 bilaterally were debrided in length and girth with sterile nail nippers and dremel without incident. -Patient/POA to call should there be question/concern in the interim.   No follow-ups on file.  Freddie Breech, DPM

## 2023-05-30 ENCOUNTER — Other Ambulatory Visit: Payer: Self-pay | Admitting: *Deleted

## 2023-05-30 DIAGNOSIS — I739 Peripheral vascular disease, unspecified: Secondary | ICD-10-CM

## 2023-06-07 ENCOUNTER — Ambulatory Visit (HOSPITAL_COMMUNITY)
Admission: RE | Admit: 2023-06-07 | Discharge: 2023-06-07 | Disposition: A | Discharge status: Home / Self Care | Payer: 59 | Source: Ambulatory Visit | Attending: Vascular Surgery | Admitting: Vascular Surgery

## 2023-06-07 ENCOUNTER — Encounter: Payer: Self-pay | Admitting: Physician Assistant

## 2023-06-07 ENCOUNTER — Ambulatory Visit (INDEPENDENT_AMBULATORY_CARE_PROVIDER_SITE_OTHER): Payer: 59 | Admitting: Physician Assistant

## 2023-06-07 VITALS — BP 112/72 | HR 89 | Temp 97.7°F

## 2023-06-07 DIAGNOSIS — I739 Peripheral vascular disease, unspecified: Secondary | ICD-10-CM | POA: Diagnosis present

## 2023-06-07 DIAGNOSIS — I70211 Atherosclerosis of native arteries of extremities with intermittent claudication, right leg: Secondary | ICD-10-CM

## 2023-06-07 LAB — VAS US ABI WITH/WO TBI

## 2023-06-07 NOTE — Progress Notes (Signed)
Office Note     CC:  follow up Requesting Provider:  Gust Rung, DO  HPI: Eddie [name] is a 72 y.o. (1951-02-21) male who presents for follow up of PAD. 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 He has had some claudication symptoms but these have been non disabling. He has no history of rest pain or tissue loss. Today he reports that he continues to have tiredness in his right leg. This develops at about 2 blocks walking distance. This is improved with rest after 10-25 minutes. He says he can continue walking after this but  his leg will again fatigue. He does try to stay active. He uses a cane to help him walk. He is limited also by his COPD. He denies any pain in his legs at rest or tissue loss. He does report cramping frequently in his right leg at night or when he first gets up in the morning and stretches. He denies any left lower extremity pain on rest or ambulation. He does continue to smoke but he says a pack may last him 1-2 months. He is medically managed on Aspirin, Statin and Plavix.  His risk factors for peripheral arterial disease include type 2 diabetes, hypertension, and tobacco use.   He has multiple other medical comorbidities including COPD, coronary artery disease, and combined systolic and diastolic congestive heart failure.  Past Medical History:  Diagnosis Date   Blindness of left eye    CAD (coronary artery disease)    a. STEMI 09/2015 w/ DES to Prox LAD   CVA (cerebral infarction)    a. 09/2015: acute small right frontal lobe infarct   Diabetes (HCC)    Hypertension    Ischemic cardiomyopathy    OSA (obstructive sleep apnea) 07/06/2017   Mild OSA with AHI of 8.2/hr with hypoxemia as low as 76% now on CPAP at 11cm H2O.   PUD (peptic ulcer disease)    ST elevation myocardial infarction involving left anterior descending (LAD) coronary artery (HCC) 09/28/2015    Past Surgical History:  Procedure Laterality Date    CARDIAC CATHETERIZATION N/A 09/28/2015   Procedure: Left Heart Cath and Coronary Angiography;  Surgeon: Runell Gess, MD;  Location: [facility] INVASIVE CV LAB;  Service: Cardiovascular;  Laterality: N/A;   CARDIAC CATHETERIZATION N/A 10/08/2015   Procedure: Left Heart Cath and Coronary Angiography;  Surgeon: Lyn Records, MD;  Location: [facility] INVASIVE CV LAB;  Service: Cardiovascular;  Laterality: N/A;   CATARACT EXTRACTION Right    Repair of Peptic Ulcer      Social History   Socioeconomic History   Marital status: Married    Spouse name: Not on file   Number of children: Not on file   Years of education: Not on file   Highest education level: Not on file  Occupational History   Occupation: Engineer, maintenance (IT)    Employer: [employer]  Tobacco Use   Smoking status: Every Day    Current packs/day: 0.30    Types: Cigarettes   Smokeless tobacco: Never  Vaping Use   Vaping status: Never Used  Substance and Sexual Activity   Alcohol use: No    Alcohol/week: 0.0 standard drinks of alcohol   Drug use: No   Sexual activity: Not on file  Other Topics Concern   Not on file  Social History Narrative   Not on file   Social Determinants of Health   Financial Resource Strain: Not on  file  Food Insecurity: Food Insecurity Present (01/09/2023)   Hunger Vital Sign    Worried About Running Out of Food in the Last Year: Sometimes true    Ran Out of Food in the Last Year: Sometimes true  Transportation Needs: Not on file  Physical Activity: Not on file  Stress: Not on file  Social Connections: Not on file  Intimate Partner Violence: Not on file    Family History  Problem Relation Age of Onset   Diabetes Mother    Cancer Neg Hx     Current Outpatient Medications  Medication Sig Dispense Refill   Accu-Chek FastClix Lancets MISC Use to check blood sugar 1 time a day 102 each 3   Acetaminophen 325 MG CAPS Take 2 tablets by mouth every 4 (four) hours as needed.     albuterol (VENTOLIN  HFA) 108 (90 Base) MCG/ACT inhaler Inhale 2 puffs into the lungs every 6 (six) hours as needed for wheezing or shortness of breath. 8.5 g 2   aspirin EC 81 MG tablet Take 1 tablet (81 mg total) by mouth daily. 90 tablet 3   atorvastatin (LIPITOR) 80 MG tablet Take 1 tablet (80 mg total) by mouth daily. 90 tablet 3   carvedilol (COREG) 25 MG tablet Take 1 tablet (25 mg total) by mouth 2 (two) times daily. 180 tablet 3   clopidogrel (PLAVIX) 75 MG tablet Take 1 tablet (75 mg total) by mouth daily. 90 tablet 3   dorzolamide-timolol (COSOPT) 2-0.5 % ophthalmic solution Place 1 drop into both eyes 2 (two) times daily. 15 mL 12   empagliflozin (JARDIANCE) 10 MG TABS tablet Take 1 tablet (10 mg total) by mouth daily before breakfast. 90 tablet 3   eplerenone (INSPRA) 25 MG tablet Take 1/2  tablet (12.5 mg total) by mouth daily. 45 tablet 3   furosemide (LASIX) 40 MG tablet Take 1 tablet (40 mg total) by mouth as needed. Take 1 tablet (40 mg total) by mouth as needed (for weight gain of 3 lbs in a day or 5 lbs in a week). 30 tablet 3   glucose blood (ACCU-CHEK GUIDE) test strip USE TO CHECK BLOOD SUGAR ONCE DAILY 100 strip 12   Insulin Pen Needle (UNIFINE PENTIPS) 31G X 5 MM MISC Use daily as directed 100 each 5   Lancets Misc. (ACCU-CHEK FASTCLIX LANCET) KIT Use as directed once daily 1 kit 1   latanoprost (XALATAN) 0.005 % ophthalmic solution Place 1 drop into both eyes every evening. 7.5 mL 12   liraglutide (VICTOZA) 18 MG/3ML SOPN Inject 0.6 mg into the skin daily. 9 mL 3   metFORMIN (GLUCOPHAGE) 500 MG tablet Take 1 tablet (500 mg total) by mouth 2 (two) times daily with a meal. 180 tablet 3   nitroGLYCERIN (NITROSTAT) 0.4 MG SL tablet Use one pill every 5 minutes X 3 if needed for chest pain. Contact MD if pain does not improve 30 tablet 0   sacubitril-valsartan (ENTRESTO) 49-51 MG Take 1 tablet by mouth 2 (two) times daily. 60 tablet 6   vitamin B-12 (CYANOCOBALAMIN) 1000 MCG tablet Take 1 tablet  (1,000 mcg total) by mouth daily. 90 tablet 1   Blood Glucose Monitoring Suppl (ACCU-CHEK GUIDE) w/Device KIT Use as directed 1 kit 0   No current facility-administered medications for this visit.    Allergies  Allergen Reactions   Bidil [Isosorb Dinitrate-Hydralazine] Other (See Comments)    Patient became lightheaded with 1 tablet tid.      REVIEW  OF SYSTEMS:    [X]  denotes positive finding, [ ]  denotes negative finding Cardiac  Comments:  Chest pain or chest pressure:    Shortness of breath upon exertion:    Short of breath when lying flat:    Irregular heart rhythm:        Vascular    Pain in calf, thigh, or hip brought on by ambulation:    Pain in feet at night that wakes you up from your sleep:     Blood clot in your veins:    Leg swelling:         Pulmonary    Oxygen at home:    Productive cough:     Wheezing:         Neurologic    Sudden weakness in arms or legs:     Sudden numbness in arms or legs:     Sudden onset of difficulty speaking or slurred speech:    Temporary loss of vision in one eye:     Problems with dizziness:         Gastrointestinal    Blood in stool:     Vomited blood:         Genitourinary    Burning when urinating:     Blood in urine:        Psychiatric    Major depression:         Hematologic    Bleeding problems:    Problems with blood clotting too easily:        Skin    Rashes or ulcers:        Constitutional    Fever or chills:      PHYSICAL EXAMINATION:  Vitals:   06/07/23 0931  BP: 112/72  Pulse: 89  Temp: 97.7 F (36.5 C)  TempSrc: Temporal    General:  WDWN in NAD; vital signs documented above Gait: Normal, uses cane HENT: WNL, normocephalic Pulmonary: normal non-labored breathing , without wheezing Cardiac: regular HR Abdomen: soft, NT, no masses Vascular Exam/Pulses: 1+ femoral pulses bilaterally, no palpable distal pulses. Doppler PT and pero signals on the left. Very faint pero signal on the  right Extremities: without ischemic changes, without Gangrene , without cellulitis; without open wounds;  Musculoskeletal: no muscle wasting or atrophy  Neurologic: A&O X 3 Psychiatric:  The pt has Normal affect.   Non-Invasive Vascular Imaging:   +-------+-----------+-----------+------------+------------+  ABI/TBIToday's ABIToday's TBIPrevious ABIPrevious TBI  +-------+-----------+-----------+------------+------------+  Right [place]         0          [place]          0.28          +-------+-----------+-----------+------------+------------+  Left  [place]         0.2        [place]          0.25          +-------+-----------+-----------+------------+------------+    ASSESSMENT/PLAN:: 72 y.o. male here for follow up of PAD. 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 He has had some claudication symptoms but these have been non disabling. He has no history of rest pain or tissue loss.His symptoms today remain unchanged. He has RLE claudication but this is not lifestyle limiting. At this time I do not feel he needs any intervention. - His ABI's today did remain non compressible. The right TBI did decrease from his prior study. Monophasic flow bilaterally. - discussed importance of keeping his  feet protected and checking regularly for wounds - Encourage exercise regimen or walking frequently to help promote collaterals - encourage complete smoking cessation - He will continue Aspirin, statin, Plavix - He will follow up in 6-9 months with repeat ABI - He knows to call for earlier follow up if he has worsening or new concerning symptoms   Graceann Congress, PA-C [facility] [phone]  Clinic MD:   Karin Lieu

## 2023-06-11 ENCOUNTER — Other Ambulatory Visit (HOSPITAL_COMMUNITY): Payer: Self-pay

## 2023-06-15 ENCOUNTER — Other Ambulatory Visit: Payer: Self-pay

## 2023-06-15 DIAGNOSIS — I739 Peripheral vascular disease, unspecified: Secondary | ICD-10-CM

## 2023-07-04 ENCOUNTER — Ambulatory Visit: Payer: 59

## 2023-07-04 VITALS — Ht 66.0 in | Wt 127.0 lb

## 2023-07-04 DIAGNOSIS — I5042 Chronic combined systolic (congestive) and diastolic (congestive) heart failure: Secondary | ICD-10-CM

## 2023-07-04 DIAGNOSIS — Z0000 Encounter for general adult medical examination without abnormal findings: Secondary | ICD-10-CM

## 2023-07-04 NOTE — Addendum Note (Signed)
Addended by: Carlynn Purl C on: 07/04/2023 03:30 PM   Modules accepted: Orders

## 2023-07-04 NOTE — Patient Instructions (Addendum)
Eddie [name] , Thank you for taking time to come for your Medicare Wellness Visit. I appreciate your ongoing commitment to your health goals. Please review the following plan we discussed and let me know if I can assist you in the future.   Referrals/Orders/Follow-Ups/Clinician Recommendations: Remember to get your Flu vaccine soon.  You are also due for a Shingles vaccine, which you can get both done at your pharmacy.  I have put in your note that you would benefit from a shower chair.  I also informed Dr. Mikey Bussing about the leg circulation pedlar.    This is a list of the screening recommended for you and due dates:  Health Maintenance  Topic Date Due   Colon Cancer Screening  Never done   Stool Blood Test  08/30/2022   Eye exam for diabetics  10/29/2022   COVID-19 Vaccine (4 - 2023-24 season) 03/25/2023   Hemoglobin A1C  05/04/2023   Flu Shot  10/22/2023*   Zoster (Shingles) Vaccine (1 of 2) 11/21/2023*   Complete foot exam   09/22/2023   Yearly kidney function blood test for diabetes  01/18/2024   Yearly kidney health urinalysis for diabetes  02/01/2024   Medicare Annual Wellness Visit  07/03/2024   DTaP/Tdap/Td vaccine (2 - Td or Tdap) 01/13/2032   Pneumonia Vaccine  Completed   Hepatitis C Screening  Completed   HPV Vaccine  Aged Out  *Topic was postponed. The date shown is not the original due date.    Advanced directives: (Declined) Advance directive discussed with you today. Even though you declined this today, please call our office should you change your mind, and we can give you the proper paperwork for you to fill out.  Next Medicare Annual Wellness Visit scheduled for next year: Yes

## 2023-07-04 NOTE — Progress Notes (Signed)
Subjective:   Eddie [name] is a 72 y.o. male who presents for Medicare Annual/Subsequent preventive examination.  Visit Complete: Virtual I connected with  Eddie [name] on 07/04/23 by a audio enabled telemedicine application and verified that I am speaking with the correct person using two identifiers.  Patient Location: Home  Provider Location: Home Office  I discussed the limitations of evaluation and management by telemedicine. The patient expressed understanding and agreed to proceed.  Vital Signs: Because this visit was a virtual/telehealth visit, some criteria may be missing or patient reported. Any vitals not documented were not able to be obtained and vitals that have been documented are patient reported.   Cardiac Risk Factors include: advanced age (>75men, >78 women);hypertension;diabetes mellitus;male gender;Other (see comment), Risk factor comments: CKD, OSA, PAD, CAD, CVA, COPD     Objective:    Today's Vitals   07/04/23 1101  Weight: 127 lb (57.6 kg)  Height: 5\' 6"  (1.676 m)   Body mass index is 20.5 kg/m.     07/04/2023   11:29 AM 02/01/2023   10:30 AM 07/13/2022   10:36 AM 01/12/2022    8:55 AM 08/25/2021    9:46 AM 05/12/2021    9:51 AM 02/10/2021    9:35 AM  Advanced Directives  Does Patient Have a Medical Advance Directive? Yes No No No No No No  Type of Estate agent of [employer];Living will        Copy of Healthcare Power of Attorney in Chart? No - copy requested        Would patient like information on creating a medical advance directive?  No - Patient declined No - Patient declined No - Patient declined No - Patient declined No - Patient declined No - Patient declined    Current Medications (verified) Outpatient Encounter Medications as of 07/04/2023  Medication Sig   Accu-Chek FastClix Lancets MISC Use to check blood sugar 1 time a day   Acetaminophen 325 MG CAPS Take 2 tablets by mouth every 4 (four) hours as needed.    albuterol (VENTOLIN HFA) 108 (90 Base) MCG/ACT inhaler Inhale 2 puffs into the lungs every 6 (six) hours as needed for wheezing or shortness of breath.   aspirin EC 81 MG tablet Take 1 tablet (81 mg total) by mouth daily.   atorvastatin (LIPITOR) 80 MG tablet Take 1 tablet (80 mg total) by mouth daily.   carvedilol (COREG) 25 MG tablet Take 1 tablet (25 mg total) by mouth 2 (two) times daily.   clopidogrel (PLAVIX) 75 MG tablet Take 1 tablet (75 mg total) by mouth daily.   dorzolamide-timolol (COSOPT) 2-0.5 % ophthalmic solution Place 1 drop into both eyes 2 (two) times daily.   empagliflozin (JARDIANCE) 10 MG TABS tablet Take 1 tablet (10 mg total) by mouth daily before breakfast.   eplerenone (INSPRA) 25 MG tablet Take 1/2  tablet (12.5 mg total) by mouth daily.   furosemide (LASIX) 40 MG tablet Take 1 tablet (40 mg total) by mouth as needed. Take 1 tablet (40 mg total) by mouth as needed (for weight gain of 3 lbs in a day or 5 lbs in a week).   glucose blood (ACCU-CHEK GUIDE) test strip USE TO CHECK BLOOD SUGAR ONCE DAILY   Insulin Pen Needle (UNIFINE PENTIPS) 31G X 5 MM MISC Use daily as directed   Lancets Misc. (ACCU-CHEK FASTCLIX LANCET) KIT Use as directed once daily   latanoprost (XALATAN) 0.005 % ophthalmic solution Place 1 drop  into both eyes every evening.   metFORMIN (GLUCOPHAGE) 500 MG tablet Take 1 tablet (500 mg total) by mouth 2 (two) times daily with a meal.   nitroGLYCERIN (NITROSTAT) 0.4 MG SL tablet Use one pill every 5 minutes X 3 if needed for chest pain. Contact MD if pain does not improve   sacubitril-valsartan (ENTRESTO) 49-51 MG Take 1 tablet by mouth 2 (two) times daily.   vitamin B-12 (CYANOCOBALAMIN) 1000 MCG tablet Take 1 tablet (1,000 mcg total) by mouth daily.   Blood Glucose Monitoring Suppl (ACCU-CHEK GUIDE) w/Device KIT Use as directed   liraglutide (VICTOZA) 18 MG/3ML SOPN Inject 0.6 mg into the skin daily. (Patient not taking: Reported on 07/04/2023)   No  facility-administered encounter medications on file as of 07/04/2023.    Allergies (verified) Bidil [isosorb dinitrate-hydralazine]   History: Past Medical History:  Diagnosis Date   Blindness of left eye    CAD (coronary artery disease)    a. STEMI 09/2015 w/ DES to Prox LAD   CVA (cerebral infarction)    a. 09/2015: acute small right frontal lobe infarct   Diabetes (HCC)    Hypertension    Ischemic cardiomyopathy    OSA (obstructive sleep apnea) 07/06/2017   Mild OSA with AHI of 8.2/hr with hypoxemia as low as 76% now on CPAP at 11cm H2O.   PUD (peptic ulcer disease)    ST elevation myocardial infarction involving left anterior descending (LAD) coronary artery (HCC) 09/28/2015   Past Surgical History:  Procedure Laterality Date   CARDIAC CATHETERIZATION N/A 09/28/2015   Procedure: Left Heart Cath and Coronary Angiography;  Surgeon: Runell Gess, MD;  Location: [facility]VAS[facility] CV LAB;  Service: Cardiovascular;  Laterality: N/A;   CARDIAC CATHETERIZATION N/A 10/08/2015   Procedure: Left Heart Cath and Coronary Angiography;  Surgeon: Lyn Records, MD;  Location: [facility] INVASIVE CV LAB;  Service: Cardiovascular;  Laterality: N/A;   CATARACT EXTRACTION Right    Repair of Peptic Ulcer     Family History  Problem Relation Age of Onset   Diabetes Mother    Cancer Neg Hx    Social History   Socioeconomic History   Marital status: Married    Spouse name: Eddie [name]   Number of children: 2   Years of education: Not on file   Highest education level: Not on file  Occupational History   Occupation: TEFL teacher: [employer]  Tobacco Use   Smoking status: Every Day    Current packs/day: 0.30    Types: Cigarettes   Smokeless tobacco: Never  Vaping Use   Vaping status: Never Used  Substance and Sexual Activity   Alcohol use: No    Alcohol/week: 0.0 standard drinks of alcohol   Drug use: No   Sexual activity: Not on file  Other Topics Concern   Not on file   Social History Narrative   Lives with wife.   Social Determinants of Health   Financial Resource Strain: Low Risk  (07/04/2023)   Overall Financial Resource Strain (CARDIA)    Difficulty of Paying Living Expenses: Not hard at all  Food Insecurity: Food Insecurity Present (07/04/2023)   Hunger Vital Sign    Worried About Running Out of Food in the Last Year: Often true    Ran Out of Food in the Last Year: Often true  Transportation Needs: No Transportation Needs (07/04/2023)   PRAPARE - Administrator, Civil Service (Medical): No  Lack of Transportation (Non-Medical): No  Physical Activity: Inactive (07/04/2023)   Exercise Vital Sign    Days of Exercise per Week: 0 days    Minutes of Exercise per Session: 0 min  Stress: No Stress Concern Present (07/04/2023)   [employer] of Occupational Health - Occupational Stress Questionnaire    Feeling of Stress : Only a little  Social Connections: Socially Isolated (07/04/2023)   Social Connection and Isolation Panel [NHANES]    Frequency of Communication with Friends and Family: Twice a week    Frequency of Social Gatherings with Friends and Family: Never    Attends Religious Services: Never    Database administrator or Organizations: No    Attends Engineer, structural: Never    Marital Status: Married    Tobacco Counseling Ready to quit: Not Answered Counseling given: Not Answered   Clinical Intake:  Pre-visit preparation completed: Yes  Pain : No/denies pain     BMI - recorded: 20.5 Nutritional Status: BMI of 19-24  Normal Nutritional Risks: Nausea/ vomitting/ diarrhea Diabetes: Yes CBG done?: Yes (135) CBG resulted in Enter/ Edit results?: No Did pt. bring in CBG monitor from home?: No  How often do you need to have someone help you when you read instructions, pamphlets, or other written materials from your doctor or pharmacy?: 4 - Often  Interpreter Needed?: No  Information entered by  :: Blondina Coderre, RMA   Activities of Daily Living    07/04/2023   11:02 AM 02/01/2023   10:29 AM  In your present state of health, do you have any difficulty performing the following activities:  Hearing? 0 0  Vision? 1 0  Difficulty concentrating or making decisions? 0 0  Walking or climbing stairs? 0 1  Dressing or bathing? 0 0  Doing errands, shopping? 0 1  Comment sometimes he drives   Preparing Food and eating ? N   Using the Toilet? N   In the past six months, have you accidently leaked urine? N   Do you have problems with loss of bowel control? N   Managing your Medications? N   Managing your Finances? N   Housekeeping or managing your Housekeeping? N     Patient Care Team: Gust Rung, DO as PCP - General (Internal Medicine) Bensimhon, Bevelyn Buckles, MD as PCP - Advanced Heart Failure (Cardiology) Quintella Reichert, MD as PCP - Sleep Medicine (Cardiology) Augustin Schooling, MD as Consulting Physician (Ophthalmology) [facility] Cyndia Bent, MD as Consulting Physician (Ophthalmology)  Indicate any recent Medical Services you may have received from other than [facility] providers in the past year (date may be approximate).     Assessment:   This is a routine wellness examination for Jens.  Hearing/Vision screen Hearing Screening - Comments:: Denies hearing difficulties   Vision Screening - Comments:: Only can see in Rt eye   Goals Addressed   None   Depression Screen    07/04/2023   11:37 AM 02/01/2023   10:29 AM 07/13/2022   10:36 AM 01/12/2022    8:54 AM 08/25/2021    9:46 AM 05/12/2021   10:11 AM 02/10/2021    9:35 AM  PHQ 2/9 Scores  PHQ - 2 Score 3 0 0 0 0 0 0  PHQ- 9 Score 7     1     Fall Risk    07/04/2023   11:29 AM 02/01/2023   10:28 AM 07/13/2022   10:35 AM 01/12/2022  8:54 AM 08/25/2021    9:46 AM  Fall Risk   Falls in the past year? 1 1 0 0 0  Number falls in past yr: 1 0 0 0 0  Injury with Fall? 0 1 0 0 0  Risk for fall  due to :  Impaired balance/gait;Impaired mobility No Fall Risks Impaired balance/gait;Impaired mobility No Fall Risks  Follow up Falls evaluation completed;Falls prevention discussed Falls evaluation completed;Falls prevention discussed Falls evaluation completed;Falls prevention discussed Falls evaluation completed;Falls prevention discussed Falls evaluation completed    MEDICARE RISK AT HOME: Medicare Risk at Home Any stairs in or around the home?: No Home free of loose throw rugs in walkways, pet beds, electrical cords, etc?: Yes Adequate lighting in your home to reduce risk of falls?: Yes Life alert?: No Use of a cane, walker or w/c?: Yes (cane) Grab bars in the bathroom?: Yes Shower chair or bench in shower?: No (need a shower chair) Elevated toilet seat or a handicapped toilet?: No  TIMED UP AND GO:  Was the test performed?  No    Cognitive Function:        07/04/2023   11:32 AM  6CIT Screen  What Year? 0 points  What month? 0 points  What time? 0 points  Count back from 20 0 points  Months in reverse --  Repeat phrase 8 points    Immunizations Immunization History  Administered Date(s) Administered   Fluad Quad(high Dose 65+) 05/12/2021, 07/13/2022   Influenza,inj,Quad PF,6+ Mos 10/06/2016, 04/12/2017, 05/01/2018, 07/10/2019, 07/01/2020   PFIZER(Purple Top)SARS-COV-2 Vaccination 10/23/2019, 11/13/2019, 07/15/2020   Pneumococcal Conjugate-13 12/07/2016   Pneumococcal Polysaccharide-23 02/28/2018   Tdap 01/12/2022    TDAP status: Up to date  Flu Vaccine status: Due, Education has been provided regarding the importance of this vaccine. Advised may receive this vaccine at local pharmacy or Health Dept. Aware to provide a copy of the vaccination record if obtained from local pharmacy or Health Dept. Verbalized acceptance and understanding.  Pneumococcal vaccine status: Up to date  Covid-19 vaccine status: Information provided on how to obtain vaccines.    Qualifies for Shingles Vaccine? Yes   Zostavax completed No   Shingrix Completed?: No.    Education has been provided regarding the importance of this vaccine. Patient has been advised to call insurance company to determine out of pocket expense if they have not yet received this vaccine. Advised may also receive vaccine at local pharmacy or Health Dept. Verbalized acceptance and understanding.  Screening Tests Health Maintenance  Topic Date Due   Colonoscopy  Never done   COLON CANCER SCREENING ANNUAL FOBT  08/30/2022   OPHTHALMOLOGY EXAM  10/29/2022   COVID-19 Vaccine (4 - 2023-24 season) 03/25/2023   HEMOGLOBIN A1C  05/04/2023   INFLUENZA VACCINE  10/22/2023 (Originally 02/22/2023)   Zoster Vaccines- Shingrix (1 of 2) 11/21/2023 (Originally 05/25/2001)   FOOT EXAM  09/22/2023   Diabetic kidney evaluation - eGFR measurement  01/18/2024   Diabetic kidney evaluation - Urine ACR  02/01/2024   Medicare Annual Wellness (AWV)  07/03/2024   DTaP/Tdap/Td (2 - Td or Tdap) 01/13/2032   Pneumonia Vaccine 20+ Years old  Completed   Hepatitis C Screening  Completed   HPV VACCINES  Aged Out    Health Maintenance  Health Maintenance Due  Topic Date Due   Colonoscopy  Never done   COLON CANCER SCREENING ANNUAL FOBT  08/30/2022   OPHTHALMOLOGY EXAM  10/29/2022   COVID-19 Vaccine (4 - 2023-24 season) 03/25/2023  HEMOGLOBIN A1C  05/04/2023    Colorectal cancer screening: Type of screening: Cologuard. Completed 09/20/2021. Repeat every 1 years  Lung Cancer Screening: (Low Dose CT Chest recommended if Age 26-80 years, 20 pack-year currently smoking OR have quit w/in 15years.) does qualify.   Lung Cancer Screening Referral: 11/18/2021  Additional Screening:  Hepatitis C Screening: does qualify; Completed 10/06/2016  Vision Screening: Recommended annual ophthalmology exams for early detection of glaucoma and other disorders of the eye. Is the patient up to date with their annual eye exam?   Yes  Who is the provider or what is the name of the office in which the patient attends annual eye exams? Dr. Wynelle Link If pt is not established with a provider, would they like to be referred to a provider to establish care? No .   Dental Screening: Recommended annual dental exams for proper oral hygiene  Diabetic Foot Exam: Diabetic Foot Exam: Completed 01/23/202  Community Resource Referral / Chronic Care Management: CRR required this visit?  Yes   CCM required this visit?  No     Plan:     I have personally reviewed and noted the following in the patient's chart:   Medical and social history Use of alcohol, tobacco or illicit drugs  Current medications and supplements including opioid prescriptions. Patient is not currently taking opioid prescriptions. Functional ability and status Nutritional status Physical activity Advanced directives List of other physicians Hospitalizations, surgeries, and ER visits in previous 12 months Vitals Screenings to include cognitive, depression, and falls Referrals and appointments  In addition, I have reviewed and discussed with patient certain preventive protocols, quality metrics, and best practice recommendations. A written personalized care plan for preventive services as well as general preventive health recommendations were provided to patient.     Lavella Myren L Vastie Douty, CMA   07/04/2023   After Visit Summary: (Mail) Due to this being a telephonic visit, the after visit summary with patients personalized plan was offered to patient via mail   Nurse Notes: Patient is due for a Flu vaccine and Covid vaccine, which patient would like to get the Flu vaccine in office.   Patient is asking for Dr. Neita Garnet opinion in regard to a foot stepper that he saw on TV.  It is for blood circulation in your legs.  He would like to order one if Dr. Mikey Bussing agrees to it being helpful for patient's legs.  Patient explained that he gets cramps in his legs often.   Patient is also struggling with the cost of food since not working and pests in his apartment.  He also explained that he would benefit from a shower chair as well.  Patient had no other concerns to address today.

## 2023-07-05 ENCOUNTER — Telehealth: Payer: Self-pay | Admitting: *Deleted

## 2023-07-05 NOTE — Progress Notes (Signed)
  Care Coordination  Outreach Note  07/05/2023 Name: Eddie [name] MRN: [id number] DOB: 1951-04-09   Care Coordination Outreach Attempts: An unsuccessful telephone outreach was attempted today to offer the patient information about available care coordination services.  Follow Up Plan:  Additional outreach attempts will be made to offer the patient care coordination information and services.   Encounter Outcome:  No Answer  Gwenevere Ghazi  Care Coordination Care Guide  Direct Dial: [phone]

## 2023-07-06 NOTE — Progress Notes (Signed)
  Care Coordination   Note   07/06/2023 Name: Eddie [name] MRN: [id number] DOB: 01-21-1951  Eddie [name] is a 72 y.o. year old male who sees Gust Rung, DO for primary care. I reached out to [name] by phone today to offer care coordination services.  [name] was given information about Care Coordination services today including:   The Care Coordination services include support from the care team which includes your Nurse Coordinator, Clinical Social Worker, or Pharmacist.  The Care Coordination team is here to help remove barriers to the health concerns and goals most important to you. Care Coordination services are voluntary, and the patient may decline or stop services at any time by request to their care team member.   Care Coordination Consent Status: Patient agreed to services and verbal consent obtained.   Follow up plan:  Telephone appointment with care coordination team member scheduled for:  12/26  Encounter Outcome:  Patient Scheduled  [facility] Coordination Care Guide  Direct Dial: [phone]

## 2023-07-10 ENCOUNTER — Other Ambulatory Visit (HOSPITAL_COMMUNITY): Payer: Self-pay

## 2023-07-10 ENCOUNTER — Other Ambulatory Visit (HOSPITAL_COMMUNITY): Payer: Self-pay | Admitting: Internal Medicine

## 2023-07-10 ENCOUNTER — Other Ambulatory Visit (HOSPITAL_COMMUNITY): Payer: Self-pay | Admitting: Family Medicine

## 2023-07-10 MED ORDER — CLOPIDOGREL BISULFATE 75 MG PO TABS
75.0000 mg | ORAL_TABLET | Freq: Every day | ORAL | 3 refills | Status: DC
  Filled 2023-07-10: qty 90, 90d supply, fill #0
  Filled 2023-10-11: qty 90, 90d supply, fill #1
  Filled 2024-01-05: qty 90, 90d supply, fill #2

## 2023-07-10 MED ORDER — FUROSEMIDE 40 MG PO TABS
40.0000 mg | ORAL_TABLET | ORAL | 3 refills | Status: DC | PRN
  Filled 2023-07-10: qty 30, 30d supply, fill #0
  Filled 2023-08-08: qty 30, 30d supply, fill #1
  Filled 2023-09-09: qty 30, 30d supply, fill #2
  Filled 2023-10-11: qty 30, 30d supply, fill #3

## 2023-07-10 MED ORDER — ENTRESTO 49-51 MG PO TABS
1.0000 | ORAL_TABLET | Freq: Two times a day (BID) | ORAL | 11 refills | Status: DC
  Filled 2023-07-10: qty 60, 30d supply, fill #0
  Filled 2023-08-08: qty 60, 30d supply, fill #1
  Filled 2023-09-09: qty 60, 30d supply, fill #2
  Filled 2023-10-06: qty 60, 30d supply, fill #3
  Filled 2023-11-05: qty 60, 30d supply, fill #4
  Filled 2023-12-05 – 2023-12-06 (×2): qty 60, 30d supply, fill #5
  Filled 2024-01-05: qty 60, 30d supply, fill #6
  Filled 2024-02-07: qty 60, 30d supply, fill #7
  Filled 2024-03-09: qty 60, 30d supply, fill #8
  Filled 2024-04-05: qty 60, 30d supply, fill #9
  Filled 2024-05-18: qty 60, 30d supply, fill #10
  Filled 2024-06-15: qty 60, 30d supply, fill #11

## 2023-07-11 ENCOUNTER — Telehealth: Payer: Self-pay | Admitting: *Deleted

## 2023-07-11 NOTE — Progress Notes (Signed)
Complex Care Management  Outreach Note  07/11/2023 Name: Eddie [name] MRN: [id number] DOB: 10-Aug-1950   Complex Care Management Outreach Attempts: An unsuccessful telephone outreach was attempted today to offer the patient information about available complex care management services.  Follow Up Plan:  Additional outreach attempts will be made to offer the patient complex care management information and services.   Encounter Outcome:  No Answer  , message left  [facility]  Population Health Careguide  Direct Dial: [phone] Website: [email or web address]

## 2023-07-13 ENCOUNTER — Telehealth: Payer: Self-pay | Admitting: *Deleted

## 2023-07-13 NOTE — Progress Notes (Signed)
Complex Care Management  Outreach Note  07/13/2023 Name: Eddie [name] MRN: [id number] DOB: 1951-07-10   Complex Care Management Outreach Attempts: A second unsuccessful outreach was attempted today to offer the patient with information about available complex care management services.  Follow Up Plan:  Additional outreach attempts will be made to offer the patient complex care management information and services.   Encounter Outcome: Left voicemail   Dione Booze Ms [facility]  Population Health Careguide  Direct Dial: [phone] Website: [email or web address]

## 2023-07-16 ENCOUNTER — Telehealth: Payer: Self-pay | Admitting: *Deleted

## 2023-07-16 NOTE — Progress Notes (Signed)
Complex Care Management Note Care Guide NoteComplex Care Management Note Care Guide Note  07/16/2023 Name: Eddie [name] MRN: [id number] DOB: 01-07-1951   Complex Care Management Outreach Attempts: A third unsuccessful outreach was attempted today to offer the patient with information about available complex care management services.  Follow Up Plan:  No further outreach attempts will be made at this time. We have been unable to contact the patient to offer or enroll patient in complex care management services.  Encounter Outcome:  No Answer  [facility]  Population Health Careguide  Direct Dial: [phone] Website: [email or web address]

## 2023-07-19 ENCOUNTER — Ambulatory Visit: Payer: Self-pay | Admitting: Licensed Clinical Social Worker

## 2023-07-19 NOTE — Patient Outreach (Signed)
  Care Coordination   07/19/2023 Name: MIT ISSAC MRN: [id number] DOB: [birth year]   Care Coordination Outreach Attempts:  An unsuccessful outreach was attempted for an appointment today.  Follow Up Plan:  Additional outreach attempts will be made to offer the patient complex care management information and services.   Encounter Outcome:  No Answer   Care Coordination Interventions:  No, not indicated    Jeanie Cooks, PhD [facility], [facility] Social Worker Direct Dial: [phone]  Fax: [phone]

## 2023-08-13 ENCOUNTER — Ambulatory Visit: Payer: Self-pay | Admitting: Licensed Clinical Social Worker

## 2023-08-13 NOTE — Patient Outreach (Signed)
  Care Coordination   08/13/2023 Name: Eddie [name] MRN: [id number] DOB: 1951-04-26   Care Coordination Outreach Attempts:  An unsuccessful outreach was attempted for an appointment today.  Follow Up Plan:  Additional outreach attempts will be made to offer the patient complex care management information and services.   Encounter Outcome:  No Answer   Care Coordination Interventions:  No, not indicated    Jeanie Cooks, PhD [facility], [facility] Social Worker Direct Dial: [phone]  Fax: [phone]

## 2023-08-14 ENCOUNTER — Ambulatory Visit (INDEPENDENT_AMBULATORY_CARE_PROVIDER_SITE_OTHER): Payer: 59 | Admitting: Podiatry

## 2023-08-14 ENCOUNTER — Other Ambulatory Visit: Payer: Self-pay | Admitting: Podiatry

## 2023-08-14 ENCOUNTER — Other Ambulatory Visit (HOSPITAL_COMMUNITY): Payer: Self-pay

## 2023-08-14 ENCOUNTER — Telehealth (INDEPENDENT_AMBULATORY_CARE_PROVIDER_SITE_OTHER): Payer: 59 | Admitting: Podiatry

## 2023-08-14 ENCOUNTER — Ambulatory Visit (INDEPENDENT_AMBULATORY_CARE_PROVIDER_SITE_OTHER): Payer: 59

## 2023-08-14 ENCOUNTER — Encounter: Payer: Self-pay | Admitting: Podiatry

## 2023-08-14 DIAGNOSIS — L03031 Cellulitis of right toe: Secondary | ICD-10-CM

## 2023-08-14 DIAGNOSIS — M79675 Pain in left toe(s): Secondary | ICD-10-CM | POA: Diagnosis not present

## 2023-08-14 DIAGNOSIS — E1140 Type 2 diabetes mellitus with diabetic neuropathy, unspecified: Secondary | ICD-10-CM

## 2023-08-14 DIAGNOSIS — I739 Peripheral vascular disease, unspecified: Secondary | ICD-10-CM | POA: Diagnosis not present

## 2023-08-14 DIAGNOSIS — Z794 Long term (current) use of insulin: Secondary | ICD-10-CM

## 2023-08-14 DIAGNOSIS — B351 Tinea unguium: Secondary | ICD-10-CM

## 2023-08-14 DIAGNOSIS — M79674 Pain in right toe(s): Secondary | ICD-10-CM

## 2023-08-14 DIAGNOSIS — L02611 Cutaneous abscess of right foot: Secondary | ICD-10-CM

## 2023-08-14 MED ORDER — DOXYCYCLINE HYCLATE 100 MG PO CAPS
100.0000 mg | ORAL_CAPSULE | Freq: Two times a day (BID) | ORAL | 0 refills | Status: AC
Start: 2023-08-14 — End: 2023-08-24
  Filled 2023-08-14: qty 20, 10d supply, fill #0

## 2023-08-14 NOTE — Telephone Encounter (Signed)
Called patient to discuss xray findings doesn't appear to be suspicious for osteomyelitis as abscess appears to be localized. Informed him Dr. Lilian Kapur will review xray on his visit next week.

## 2023-08-14 NOTE — Progress Notes (Signed)
Subjective:  Patient ID: Eddie [name], male    DOB: 1951/05/18,  MRN: [id number]  73 y.o. male presents at risk footcare. Patient has h/o diabetes, neuropathy and PAD and is seen for  and painful thick toenails that are difficult to trim. Pain interferes with ambulation. Aggravating factors include wearing enclosed shoe gear. Pain is relieved with periodic professional debridement. Chief Complaint  Patient presents with   Diabetes    Patient last saw his PCP in October , Patient A1c is 153, Patient PCP name is Carlynn Purl.   New problem(s):  with chief concern of ingrown toenail(s) to R 2nd toe. Onset was about 2 weeks ago.  Pain is described as throbbing.  Previous treatment(s) include none. Condition has has worsened. Patient states he didn't know he could call the office if he has a problem. He thought he had to wait until his scheduled appointment. Denies any fever, chills, night sweats, nausea or vomiting.  PCP is Gust Rung, DO.  Allergies  Allergen Reactions   Bidil [Isosorb Dinitrate-Hydralazine] Other (See Comments)    Patient became lightheaded with 1 tablet tid.    Review of Systems: Negative except as noted in the HPI.   Objective:  Eddie [name] is a pleasant 73 y.o. male thin build in NAD. AAO x 3.  Vascular Examination: Vascular Examination: CFT <4 seconds b/l. DP pulses diminished b/l. PT pulses diminished b/l. Digital hair absent. Skin temperature gradient warm to cool b/l. No ischemia or gangrene. No cyanosis or clubbing noted b/l.    Neurological Examination: Pt has subjective symptoms of neuropathy. Protective sensation diminished with 10g monofilament b/l.  Dermatological Examination: Pedal skin thin, shiny and atrophic b/l.  No interdigital macerations.   Incurvated nailplate medial border of R 2nd toe.  Nail border hypertrophy present. There is tenderness to palpation. Sign(s) of infection: edema, abscess with q tip head size white, creamy purulent  drainage. There is pain on palpation.  Toenails 1-5 b/l thick, discolored, elongated with subungual debris and pain on dorsal palpation.   No corns, calluses nor porokeratotic lesions noted.  Musculoskeletal Examination: Muscle strength 5/5 to all lower extremity muscle groups bilaterally. Hammertoe(s) noted to the 2-5 bilaterally and 2-5 left foot.  Xray findings right foot: No gas in tissues right foot. Soft tissue swelling present right second digit. Vessel calcifications noted right foot. No osteolysis of distal phalanx noted on AP view. Pes planus foot deformity right foot. No foreign body evident right foot.  Last A1c:      Latest Ref Rng & Units 02/01/2023   10:45 AM  Hemoglobin A1C  Hemoglobin-A1c 4.0 - 5.6 % 5.5     Assessment:   1. Pain due to onychomycosis of toenails of both feet   2. Cellulitis and abscess of toe of right foot   3. PAD (peripheral artery disease) (HCC)   4. Type 2 diabetes mellitus with diabetic neuropathy, with long-term current use of insulin (HCC)    Plan:  Plan: -Patient was evaluated and treated and all questions answered.  -Right 2nd digit: Offending nail border debrided and localized abscess evacuated and irrigated with alcohol. Cleansed with wound cleanser. TAO and band-aid applied. Dispensed printed epsom salt soaking instructions. -Rx for Doxycyline 100 mg, #20, to be taken one capsule twice daily for 10 days. -Wound culture and sensitivity ordered today. -Xray 3 views taken today. Discussed with patient via telephone today.  -Toenails were debrided in length and girth 1-5 bilaterally with sterile nail nippers and  dremel without iatrogenic bleeding.  -Patient scheduled to see Dr. Sharl Ma  in one week for follow up of cellulitis/abscess right 2nd toe. -Patient/POA to call should there be question/concern in the interim.  Return in about 1 week (around 08/21/2023).  Freddie Breech, DPM      [place] LOCATION: [address] [place] [address]                   Office [phone]   [facility] LOCATION: [address] [place], [place] [address] Office [phone]

## 2023-08-14 NOTE — Patient Instructions (Signed)
EPSOM SALT FOOT SOAK INSTRUCTIONS  *IF YOU HAVE BEEN PRESCRIBED ANTIBIOTICS, TAKE AS INSTRUCTED UNTIL ALL ARE GONE*  Shopping List:  A. Plain epsom salt (not scented) B. Neosporin Cream C. 1-inch fabric band-aids   Place 1/4 cup of epsom salts in 2 quarts of warm tap water. IF YOU ARE DIABETIC, OR HAVE NEUROPATHY, CHECK THE TEMPERATURE OF THE WATER WITH YOUR ELBOW.   Submerge your foot/feet in the solution and soak for 10-15 minutes.      3.  Next, remove your foot/feet from solution, blot dry the affected area.    4.  Apply light amount of antibiotic cream/ointment and cover with fabric band-aid .  5.  This soak should be done once a day for 10 days.   6.  Monitor for any signs/symptoms of infection such as redness, swelling, odor, drainage, increased pain, or non-healing of digit.   7.  Please do not hesitate to call the office and speak to a Nurse or Doctor if you have questions.   8.  If you experience fever, chills, nightsweats, nausea or vomiting with worsening of digit/foot, please go to the emergency room.

## 2023-08-17 LAB — WOUND CULTURE
MICRO NUMBER:: 15982109
SPECIMEN QUALITY:: ADEQUATE

## 2023-08-17 LAB — DUPLICATE REPORT

## 2023-08-17 LAB — HOUSE ACCOUNT TRACKING

## 2023-08-20 ENCOUNTER — Telehealth: Payer: Self-pay | Admitting: *Deleted

## 2023-08-20 NOTE — Progress Notes (Unsigned)
Complex Care Management Care Guide Note  08/20/2023 Name: Eddie [name] MRN: [id number] DOB: 03-Aug-1950  Eddie [name] is a 73 y.o. year old male who is a primary care patient of Gust Rung, DO and is actively engaged with the care management team. I reached out to [name] by phone today to assist with re-scheduling  with the BSW.  Follow up plan: Unsuccessful telephone outreach attempt made. A HIPAA compliant phone message was left for the patient providing contact information and requesting a return call.  Gwenevere Ghazi  [facility]  [facility], [facility] Guide  Direct Dial: [phone]  Fax [phone]

## 2023-08-21 ENCOUNTER — Ambulatory Visit (INDEPENDENT_AMBULATORY_CARE_PROVIDER_SITE_OTHER): Payer: 59 | Admitting: Podiatry

## 2023-08-21 ENCOUNTER — Encounter: Payer: Self-pay | Admitting: Podiatry

## 2023-08-21 VITALS — Ht 66.0 in | Wt 127.0 lb

## 2023-08-21 DIAGNOSIS — L02611 Cutaneous abscess of right foot: Secondary | ICD-10-CM | POA: Diagnosis not present

## 2023-08-21 DIAGNOSIS — L03031 Cellulitis of right toe: Secondary | ICD-10-CM

## 2023-08-21 DIAGNOSIS — I739 Peripheral vascular disease, unspecified: Secondary | ICD-10-CM | POA: Diagnosis not present

## 2023-08-21 NOTE — Progress Notes (Signed)
Complex Care Management Care Guide Note  08/21/2023 Name: BOWYN MERCIER MRN: [id number] DOB: 09-Oct-1950  TERRILL ALPERIN is a 72 y.o. year old male who is a primary care patient of Gust Rung, DO and is actively engaged with the care management team. I reached out to [name] by phone today to assist with re-scheduling  with the BSW.  Follow up plan: Unsuccessful telephone outreach attempt made. A HIPAA compliant phone message was left for the patient providing contact information and requesting a return call.No further outreach attempts will be made at this time. We have been unable to contact the patient.  Gwenevere Ghazi  [facility]  Value-Based Care Institute, [facility] Guide  Direct Dial: [phone]  Fax [phone]

## 2023-08-21 NOTE — Progress Notes (Signed)
  Subjective:  Patient ID: Eddie [name], male    DOB: 03/16/1951,  MRN: [id number]  Chief Complaint  Patient presents with   Wound Check    cellulitis abscess right 2nd toe, toe is healing and looks better still taking antibiotics    73 y.o. male presents with the above complaint. History confirmed with patient.  He is here for follow-up on a medial right second toe infection had it debrided and x-rays at last visit with Dr. Eloy End he has been taking the doxycycline has about 6 days left  Objective:  Physical Exam: Pulses decreased on the right lower extremity warm at the ankle and cool to touch at the toes his medial right great toe shows no active signs of infection swelling is improving    Assessment:   1. Cellulitis and abscess of toe of right foot   2. PAD (peripheral artery disease) (HCC)      Plan:  Patient was evaluated and treated and all questions answered.  Infection improving he should complete his antibiotic course, do not expect this will need nail avulsion at this point he may have slow healing due to his PAD.  Last had ABIs in November.  I will reassess him in 4 weeks to see how his toe is healing if he shows signs of nonhealing then we will plan for referral to vascular surgery for angiography.  I discussed with him signs and symptoms of a worsening infection he should notify me immediately including increasing redness swelling purulent drainage malodor fever chills nausea vomiting.  Return in about 4 weeks (around 09/18/2023) for follow up on toe infection .

## 2023-09-05 ENCOUNTER — Other Ambulatory Visit: Payer: Self-pay | Admitting: Internal Medicine

## 2023-09-05 ENCOUNTER — Other Ambulatory Visit (HOSPITAL_COMMUNITY): Payer: Self-pay

## 2023-09-05 MED ORDER — GLUCOSE BLOOD VI STRP
ORAL_STRIP | Freq: Every day | 12 refills | Status: DC
  Filled 2023-09-05: qty 100, 100d supply, fill #0
  Filled 2023-11-29: qty 100, 100d supply, fill #1

## 2023-09-11 ENCOUNTER — Other Ambulatory Visit (HOSPITAL_COMMUNITY): Payer: Self-pay

## 2023-09-18 ENCOUNTER — Other Ambulatory Visit (HOSPITAL_COMMUNITY): Payer: Self-pay

## 2023-09-18 ENCOUNTER — Ambulatory Visit (INDEPENDENT_AMBULATORY_CARE_PROVIDER_SITE_OTHER): Payer: 59 | Admitting: Podiatry

## 2023-09-18 DIAGNOSIS — L03031 Cellulitis of right toe: Secondary | ICD-10-CM | POA: Diagnosis not present

## 2023-09-18 DIAGNOSIS — L02611 Cutaneous abscess of right foot: Secondary | ICD-10-CM

## 2023-09-18 DIAGNOSIS — L600 Ingrowing nail: Secondary | ICD-10-CM

## 2023-09-18 MED ORDER — LIDOCAINE 5 % EX OINT
1.0000 | TOPICAL_OINTMENT | CUTANEOUS | 3 refills | Status: DC | PRN
  Filled 2023-09-18: qty 35.44, 30d supply, fill #0

## 2023-09-18 NOTE — Progress Notes (Signed)
  Subjective:  Patient ID: DETRELL UMSCHEID, male    DOB: 1951/05/04,  MRN: [id number]  Chief Complaint  Patient presents with   Abscess    RT 2nd toe has improved with medication. Rates pain at 2/10, no drainage, no swelling. Does report pain in the RT 3rd at 7/10, not constant. This began after he finished the antibiotic he was on.     73 y.o. male presents with the above complaint. History confirmed with patient.  He returns for follow-up  Objective:  Physical Exam: Pulses decreased on the right lower extremity warm at the ankle and cool to touch at the toes his first and second toe has healed has slight incurvated border of third toe medial border    Assessment:   1. Cellulitis and abscess of toe of right foot   2. Ingrown nail      Plan:  Patient was evaluated and treated and all questions answered.  Infection had resolved.  No further antibiotics necessary.  Had ingrowing of medial border of third toe.  Debrided in slant back fashion to alleviate pain.  Follow-up as needed.  Epsom salt soaks as needed.  Return if symptoms worsen or fail to improve.

## 2023-09-19 ENCOUNTER — Other Ambulatory Visit (HOSPITAL_COMMUNITY): Payer: Self-pay

## 2023-09-20 ENCOUNTER — Ambulatory Visit: Payer: 59 | Admitting: Internal Medicine

## 2023-09-20 ENCOUNTER — Encounter: Payer: Self-pay | Admitting: Internal Medicine

## 2023-09-20 VITALS — BP 113/70 | HR 78 | Temp 98.1°F | Ht 66.0 in | Wt 134.8 lb

## 2023-09-20 DIAGNOSIS — E1122 Type 2 diabetes mellitus with diabetic chronic kidney disease: Secondary | ICD-10-CM

## 2023-09-20 DIAGNOSIS — M7541 Impingement syndrome of right shoulder: Secondary | ICD-10-CM | POA: Diagnosis not present

## 2023-09-20 DIAGNOSIS — E1151 Type 2 diabetes mellitus with diabetic peripheral angiopathy without gangrene: Secondary | ICD-10-CM

## 2023-09-20 DIAGNOSIS — I739 Peripheral vascular disease, unspecified: Secondary | ICD-10-CM

## 2023-09-20 DIAGNOSIS — E1140 Type 2 diabetes mellitus with diabetic neuropathy, unspecified: Secondary | ICD-10-CM

## 2023-09-20 DIAGNOSIS — N1832 Chronic kidney disease, stage 3b: Secondary | ICD-10-CM

## 2023-09-20 DIAGNOSIS — I5042 Chronic combined systolic (congestive) and diastolic (congestive) heart failure: Secondary | ICD-10-CM

## 2023-09-20 DIAGNOSIS — I130 Hypertensive heart and chronic kidney disease with heart failure and stage 1 through stage 4 chronic kidney disease, or unspecified chronic kidney disease: Secondary | ICD-10-CM | POA: Diagnosis not present

## 2023-09-20 DIAGNOSIS — Z23 Encounter for immunization: Secondary | ICD-10-CM

## 2023-09-20 DIAGNOSIS — G2581 Restless legs syndrome: Secondary | ICD-10-CM

## 2023-09-20 DIAGNOSIS — J432 Centrilobular emphysema: Secondary | ICD-10-CM

## 2023-09-20 DIAGNOSIS — I10 Essential (primary) hypertension: Secondary | ICD-10-CM

## 2023-09-20 DIAGNOSIS — Z1211 Encounter for screening for malignant neoplasm of colon: Secondary | ICD-10-CM

## 2023-09-20 LAB — POCT GLYCOSYLATED HEMOGLOBIN (HGB A1C): Hemoglobin A1C: 6.3 % — AB (ref 4.0–5.6)

## 2023-09-20 LAB — GLUCOSE, CAPILLARY: Glucose-Capillary: 168 mg/dL — ABNORMAL HIGH (ref 70–99)

## 2023-09-20 MED ORDER — LIDOCAINE HCL (PF) 1 % IJ SOLN
2.0000 mL | Freq: Once | INTRAMUSCULAR | Status: AC
Start: 2023-09-20 — End: 2023-09-20
  Administered 2023-09-20: 2 mL

## 2023-09-20 MED ORDER — TRIAMCINOLONE ACETONIDE 40 MG/ML IJ SUSP
40.0000 mg | Freq: Once | INTRAMUSCULAR | Status: AC
  Administered 2023-09-20: 40 mg via INTRA_ARTICULAR

## 2023-09-20 NOTE — Assessment & Plan Note (Signed)
 Appears euvolemic on exam continue Entresto, coreg and eplerenone.

## 2023-09-20 NOTE — Assessment & Plan Note (Signed)
 Steroid injection right shoulder today.

## 2023-09-20 NOTE — Assessment & Plan Note (Signed)
 Lower extremity symptoms sound to be more likely due to restless leg.  Given his CKD and history of iron deficiency will want to rule out iron deficiency and vitamin D deficiency as contributing causes we will check those labs today.

## 2023-09-20 NOTE — Assessment & Plan Note (Signed)
 Continue dual antiplatelet therapy along with high-dose statin.  Encouraged walking.

## 2023-09-20 NOTE — Assessment & Plan Note (Signed)
 A1c 6.3% today doing well off Victoza continue Jardiance and metformin

## 2023-09-20 NOTE — Assessment & Plan Note (Signed)
 Recheck labs include vitamin D blood pressure at goal on SGLT2 and ARB

## 2023-09-20 NOTE — Assessment & Plan Note (Signed)
 Denies any shortness of breath today overall feeling well.  Continue albuterol as needed.

## 2023-09-20 NOTE — Progress Notes (Signed)
 Established Patient Office Visit  Subjective   Patient ID: Eddie [name], male    DOB: October 27, 1950  Age: 73 y.o. MRN: [id number]  Chief Complaint  Patient presents with   Medical Management of Chronic Issues    Right shoulder pain. Flu vaccine   Eddie [name] is here today for follow up of multiple chronic condions as well as a complaint of right shoulder pain.  He has a history of shoulder impingment and notes that the steroid injections in the past have helped remarkably.  Pain is worse laying on the shoulder and lifting above midline.  No fever chills or swelling.  Checking blood sugar every day, AM sugars are ranging 130s-140s.  No longer taking victoza, still taking jardiance and metformin   Does have cramping in his lower legs, worse at night, helps to move them around, sometimes keeps him up.     Objective:     BP 113/70 (BP Location: Left Arm, Patient Position: Sitting, Cuff Size: Normal)   Pulse 78   Temp 98.1 F (36.7 C) (Oral)   Ht 5\' 6"  (1.676 m)   Wt 134 lb 12.8 oz (61.1 kg)   SpO2 100% Comment: RA  BMI 21.76 kg/m  BP Readings from Last 3 Encounters:  09/20/23 113/70  06/07/23 112/72  02/01/23 (!) 106/59   Wt Readings from Last 3 Encounters:  09/20/23 134 lb 12.8 oz (61.1 kg)  08/21/23 127 lb (57.6 kg)  07/04/23 127 lb (57.6 kg)      Physical Exam Vitals and nursing note reviewed.  Constitutional:      Appearance: Normal appearance.  Cardiovascular:     Rate and Rhythm: Normal rate and regular rhythm.     Pulses:          Dorsalis pedis pulses are 1+ on the right side and 1+ on the left side.       Posterior tibial pulses are 1+ on the right side and 1+ on the left side.  Skin:    Capillary Refill: Capillary refill takes less than 2 seconds.  Neurological:     Mental Status: He is alert.      Results for orders placed or performed in visit on 09/20/23  Glucose, capillary  Result Value Ref Range   Glucose-Capillary 168 (H) 70 - 99 mg/dL  POC Hbg  B2W  Result Value Ref Range   Hemoglobin A1C 6.3 (A) 4.0 - 5.6 %   HbA1c POC (<> result, manual entry)     HbA1c, POC (prediabetic range)     HbA1c, POC (controlled diabetic range)      Last CBC Lab Results  Component Value Date   WBC 5.0 01/09/2023   HGB 9.8 (L) 01/09/2023   HCT 32.0 (L) 01/09/2023   MCV 81.2 01/09/2023   MCH 24.9 (L) 01/09/2023   RDW 13.2 01/09/2023   PLT 208 01/09/2023   Last metabolic panel Lab Results  Component Value Date   GLUCOSE 121 (H) 01/18/2023   NA 138 01/18/2023   K 3.6 01/18/2023   CL 103 01/18/2023   CO2 27 01/18/2023   BUN 30 (H) 01/18/2023   CREATININE 2.09 (H) 01/18/2023   GFRNONAA 33 (L) 01/18/2023   CALCIUM 8.5 (L) 01/18/2023   PHOS 3.7 01/07/2016   PROT 6.9 01/09/2023   ALBUMIN 3.6 01/09/2023   BILITOT 0.5 01/09/2023   ALKPHOS 63 01/09/2023   AST 14 (L) 01/09/2023   ALT 13 01/09/2023   ANIONGAP 8 01/18/2023   Last lipids Lab  Results  Component Value Date   CHOL 115 08/25/2021   HDL 46 08/25/2021   LDLCALC 56 08/25/2021   TRIG 59 08/25/2021   CHOLHDL 2.5 08/25/2021   Last hemoglobin A1c Lab Results  Component Value Date   HGBA1C 6.3 (A) 09/20/2023      The ASCVD Risk score (Arnett DK, et al., 2019) failed to calculate for the following reasons:   Risk score cannot be calculated because patient has a medical history suggesting prior/existing ASCVD    Assessment & Plan:   Problem List Items Addressed This Visit       Cardiovascular and Mediastinum   Essential hypertension (Chronic)   BP well controlled continue entresto, coreg and eplerenone       Relevant Orders   CBC no Diff   CMP14 + Anion Gap   Chronic combined systolic and diastolic heart failure (HCC) (Chronic)   Appears euvolemic on exam continue Entresto, coreg and eplerenone.      PAD (peripheral artery disease) (HCC)   Continue dual antiplatelet therapy along with high-dose statin.  Encouraged walking.        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  Continue albuterol as needed.        Endocrine   Controlled type 2 diabetes mellitus with diabetic neuropathy, without long-term current use of insulin (HCC) - Primary (Chronic)   A1c 6.3% today doing well off Victoza continue Jardiance and metformin      Relevant Orders   POC Hbg A1C (Completed)   CBC no Diff   CMP14 + Anion Gap   Vitamin B12     Musculoskeletal and Integument   Impingement syndrome of right shoulder   Steroid injection right shoulder today.        Genitourinary   CKD (chronic kidney disease) stage 3, GFR 30-59 ml/min (HCC) (Chronic)   Recheck labs include vitamin D blood pressure at goal on SGLT2 and ARB      Relevant Orders   Iron, TIBC and Ferritin Panel   CBC no Diff   CMP14 + Anion Gap   Vitamin D (25 hydroxy)     Other   Restless leg   Lower extremity symptoms sound to be more likely due to restless leg.  Given his CKD and history of iron deficiency will want to rule out iron deficiency and vitamin D deficiency as contributing causes we will check those labs today.      Relevant Orders   Iron, TIBC and Ferritin Panel   Other Visit Diagnoses       Colon cancer screening       Relevant Orders   Cologuard     Encounter for immunization       Relevant Orders   Flu Vaccine Trivalent High Dose (Fluad) (Completed)       Return in about 6 months (around 03/19/2024) for HTN, DM.    Gust Rung, DO  PROCEDURE NOTE  PROCEDURE: right shoulder joint steroid injection.  PREOPERATIVE DIAGNOSIS: Bursitis of the right shoulder.  POSTOPERATIVE DIAGNOSIS: Bursitis of the right shoulder.  PROCEDURE: The patient was apprised of the risks and the benefits of the procedure and informed consent was obtained, as witnessed by [facility]. Time-out procedure was performed, with confirmation of the patient's name, date of birth, and  correct identification of the right shoulder to be injected. The patient's shoulder was then marked at the appropriate  site for injection placement. The shoulder was sterilely prepped with Betadine. A 40 mg (1 milliliter) solution of Kenalog was drawn up into a 3 mL syringe with a 2 mL of 1% lidocaine. The patient was injected with a 25 gauge needle at the lateral  aspect of his  right shoulder. There were no complications. The patient tolerated the procedure well. There was minimal bleeding. The patient was instructed to ice her shoulder upon leaving clinic and refrain from overuse over the next 3 days. The patient was instructed to go to the emergency room with any usual pain, swelling, or redness occurred in the injected area. The patient was given a followup appointment to evaluate response to the injection to his increased range of motion and reduction of pain.

## 2023-09-20 NOTE — Assessment & Plan Note (Signed)
 BP well controlled continue entresto, coreg and eplerenone

## 2023-09-22 LAB — CBC
Hematocrit: 31.6 % — ABNORMAL LOW (ref 37.5–51.0)
Hemoglobin: 9.3 g/dL — ABNORMAL LOW (ref 13.0–17.7)
MCH: 25.1 pg — ABNORMAL LOW (ref 26.6–33.0)
MCHC: 29.4 g/dL — ABNORMAL LOW (ref 31.5–35.7)
MCV: 85 fL (ref 79–97)
Platelets: 268 10*3/uL (ref 150–450)
RBC: 3.71 x10E6/uL — ABNORMAL LOW (ref 4.14–5.80)
RDW: 12.3 % (ref 11.6–15.4)
WBC: 4.7 10*3/uL (ref 3.4–10.8)

## 2023-09-22 LAB — CMP14 + ANION GAP
ALT: 17 IU/L (ref 0–44)
AST: 19 IU/L (ref 0–40)
Albumin: 4.5 g/dL (ref 3.8–4.8)
Alkaline Phosphatase: 107 IU/L (ref 44–121)
Anion Gap: 16 mmol/L (ref 10.0–18.0)
BUN/Creatinine Ratio: 15 (ref 10–24)
BUN: 34 mg/dL — ABNORMAL HIGH (ref 8–27)
Bilirubin Total: 0.3 mg/dL (ref 0.0–1.2)
CO2: 23 mmol/L (ref 20–29)
Calcium: 9.3 mg/dL (ref 8.6–10.2)
Chloride: 101 mmol/L (ref 96–106)
Creatinine, Ser: 2.26 mg/dL — ABNORMAL HIGH (ref 0.76–1.27)
Globulin, Total: 3 g/dL (ref 1.5–4.5)
Glucose: 151 mg/dL — ABNORMAL HIGH (ref 70–99)
Potassium: 4.5 mmol/L (ref 3.5–5.2)
Sodium: 140 mmol/L (ref 134–144)
Total Protein: 7.5 g/dL (ref 6.0–8.5)
eGFR: 30 mL/min/{1.73_m2} — ABNORMAL LOW (ref >59)

## 2023-09-22 LAB — VITAMIN B12: Vitamin B-12: 417 pg/mL (ref 232–1245)

## 2023-09-22 LAB — VITAMIN D 25 HYDROXY (VIT D DEFICIENCY, FRACTURES): Vit D, 25-Hydroxy: 16.5 ng/mL — ABNORMAL LOW (ref 30.0–100.0)

## 2023-09-22 LAB — IRON,TIBC AND FERRITIN PANEL
Ferritin: 45 ng/mL (ref 30–400)
Iron Saturation: 17 % (ref 15–55)
Iron: 57 ug/dL (ref 38–169)
Total Iron Binding Capacity: 337 ug/dL (ref 250–450)
UIBC: 280 ug/dL (ref 111–343)

## 2023-10-06 LAB — COLOGUARD

## 2023-10-09 ENCOUNTER — Other Ambulatory Visit (HOSPITAL_COMMUNITY): Payer: Self-pay

## 2023-10-14 ENCOUNTER — Other Ambulatory Visit (HOSPITAL_COMMUNITY): Payer: Self-pay | Admitting: Family Medicine

## 2023-10-16 ENCOUNTER — Other Ambulatory Visit (HOSPITAL_COMMUNITY): Payer: Self-pay

## 2023-10-16 MED ORDER — EPLERENONE 25 MG PO TABS
12.5000 mg | ORAL_TABLET | Freq: Every day | ORAL | 3 refills | Status: AC
  Filled 2023-10-16: qty 45, 90d supply, fill #0
  Filled 2024-01-05: qty 45, 90d supply, fill #1
  Filled 2024-03-22: qty 45, 90d supply, fill #2
  Filled 2024-06-28: qty 45, 90d supply, fill #3

## 2023-10-18 LAB — COLOGUARD: COLOGUARD: POSITIVE — AB

## 2023-10-23 ENCOUNTER — Other Ambulatory Visit: Payer: Self-pay | Admitting: Internal Medicine

## 2023-10-23 DIAGNOSIS — R195 Other fecal abnormalities: Secondary | ICD-10-CM

## 2023-11-05 ENCOUNTER — Other Ambulatory Visit (HOSPITAL_COMMUNITY): Payer: Self-pay

## 2023-11-05 ENCOUNTER — Other Ambulatory Visit (HOSPITAL_COMMUNITY): Payer: Self-pay | Admitting: Internal Medicine

## 2023-11-05 MED ORDER — FUROSEMIDE 40 MG PO TABS
40.0000 mg | ORAL_TABLET | ORAL | 1 refills | Status: DC | PRN
  Filled 2023-11-05: qty 30, 30d supply, fill #0
  Filled 2023-12-09: qty 30, 30d supply, fill #1

## 2023-12-04 ENCOUNTER — Encounter: Payer: Self-pay | Admitting: Gastroenterology

## 2023-12-06 ENCOUNTER — Other Ambulatory Visit (HOSPITAL_COMMUNITY): Payer: Self-pay

## 2023-12-12 ENCOUNTER — Other Ambulatory Visit (HOSPITAL_COMMUNITY): Payer: Self-pay

## 2023-12-12 ENCOUNTER — Other Ambulatory Visit: Payer: Self-pay

## 2023-12-12 ENCOUNTER — Ambulatory Visit (INDEPENDENT_AMBULATORY_CARE_PROVIDER_SITE_OTHER): Payer: 59 | Admitting: Podiatry

## 2023-12-12 ENCOUNTER — Encounter: Payer: Self-pay | Admitting: Podiatry

## 2023-12-12 DIAGNOSIS — M79675 Pain in left toe(s): Secondary | ICD-10-CM

## 2023-12-12 DIAGNOSIS — M79674 Pain in right toe(s): Secondary | ICD-10-CM

## 2023-12-12 DIAGNOSIS — S91302A Unspecified open wound, left foot, initial encounter: Secondary | ICD-10-CM | POA: Diagnosis not present

## 2023-12-12 DIAGNOSIS — E119 Type 2 diabetes mellitus without complications: Secondary | ICD-10-CM

## 2023-12-12 DIAGNOSIS — E1140 Type 2 diabetes mellitus with diabetic neuropathy, unspecified: Secondary | ICD-10-CM | POA: Diagnosis not present

## 2023-12-12 DIAGNOSIS — Z794 Long term (current) use of insulin: Secondary | ICD-10-CM

## 2023-12-12 DIAGNOSIS — I70222 Atherosclerosis of native arteries of extremities with rest pain, left leg: Secondary | ICD-10-CM | POA: Diagnosis not present

## 2023-12-12 DIAGNOSIS — B351 Tinea unguium: Secondary | ICD-10-CM

## 2023-12-12 MED ORDER — GENTAMICIN SULFATE 0.1 % EX CREA
1.0000 | TOPICAL_CREAM | Freq: Every day | CUTANEOUS | 1 refills | Status: AC
  Filled 2023-12-12: qty 30, 30d supply, fill #0

## 2023-12-12 MED ORDER — DOXYCYCLINE HYCLATE 100 MG PO CAPS
100.0000 mg | ORAL_CAPSULE | Freq: Two times a day (BID) | ORAL | 0 refills | Status: AC
  Filled 2023-12-12: qty 20, 10d supply, fill #0

## 2023-12-12 NOTE — Progress Notes (Signed)
 ANNUAL DIABETIC FOOT EXAM  Subjective: Eddie [name] presents today for annual diabetic foot exam. Hw Chief Complaint  Patient presents with   Nail Problem    RFC   Patient confirms h/o diabetes.  Patient has h/o PAD.  Priscella Brooms, DO is patient's PCP.  Patient states right 2nd and 3rd digits have healed. He reports new problem of left foot pain and wound. He states there was some dry skin and he picked at it. He relates pain in the limb at night and only gets relief when hanging his foot in the dependent position.        Past Medical History:  Diagnosis Date   Blindness of left eye    CAD (coronary artery disease)    a. STEMI 09/2015 w/ DES to Prox LAD   CVA (cerebral infarction)    a. 09/2015: acute small right frontal lobe infarct   Diabetes (HCC)    Hypertension    Ischemic cardiomyopathy    OSA (obstructive sleep apnea) 07/06/2017   Mild OSA with AHI of 8.2/hr with hypoxemia as low as 76% now on CPAP at 11cm H2O.   PUD (peptic ulcer disease)    ST elevation myocardial infarction involving left anterior descending (LAD) coronary artery (HCC) 09/28/2015   Patient Active Problem List   Diagnosis Date Noted   Open wound of left foot 12/12/2023   PAD (peripheral artery disease) (HCC) 08/25/2021   Nocturia 08/25/2021   Normocytic anemia 02/10/2021   Restless leg 02/10/2021   Impingement syndrome of right shoulder 01/01/2020   COPD (chronic obstructive pulmonary disease) with emphysema (HCC) 07/10/2019   Complaining of cold hands 10/04/2018   Pain in right shoulder 05/30/2018   Primary open angle glaucoma of both eyes, severe stage 03/07/2018   Cataract of left eye 03/07/2018   Neuropathy, 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, right 03/03/2018   OSA (obstructive sleep apnea) 07/06/2017   Shoulder impingement syndrome, left 04/13/2017   Dyspnea on exertion 12/08/2016   CAD in native artery 10/06/2016   CKD (chronic kidney disease) stage 3, GFR 30-59 ml/min (HCC) 10/06/2016    Health care maintenance 10/06/2016   Esophageal reflux    Chronic combined systolic and diastolic heart failure (HCC)    Gait disturbance, post-stroke 12/09/2015   Tobacco abuse    Essential hypertension    Controlled type 2 diabetes mellitus with diabetic neuropathy, without long-term current use of insulin  (HCC)    History of CVA (cerebrovascular accident)    Primary open angle glaucoma 11/22/2012   Cataract, nuclear sclerotic, left eye 03/15/2012   Pseudophakia of right eye 03/15/2012   Past Surgical History:  Procedure Laterality Date   CARDIAC CATHETERIZATION N/A 09/28/2015   Procedure: Left Heart Cath and Coronary Angiography;  Surgeon: Avanell Leigh, MD;  Location: [facility] INVASIVE CV LAB;  Service: Cardiovascular;  Laterality: N/A;   CARDIAC CATHETERIZATION N/A 10/08/2015   Procedure: Left Heart Cath and Coronary Angiography;  Surgeon: Arty Binning, MD;  Location: [facility] INVASIVE CV LAB;  Service: Cardiovascular;  Laterality: N/A;   CATARACT EXTRACTION Right    Repair of Peptic Ulcer     Current Outpatient Medications on File Prior to Visit  Medication Sig Dispense Refill   Accu-Chek FastClix Lancets MISC Use to check blood sugar 1 time a day 102 each 3   Acetaminophen  325 MG CAPS Take 2 tablets by mouth every 4 (four) hours as needed.     albuterol  (VENTOLIN  HFA) 108 (90 Base) MCG/ACT inhaler Inhale 2  puffs into the lungs every 6 (six) hours as needed for wheezing or shortness of breath. 8.5 g 2   aspirin  EC 81 MG tablet Take 1 tablet (81 mg total) by mouth daily. 90 tablet 3   atorvastatin  (LIPITOR ) 80 MG tablet Take 1 tablet (80 mg total) by mouth daily. 90 tablet 3   carvedilol  (COREG ) 25 MG tablet Take 1 tablet (25 mg total) by mouth 2 (two) times daily. 180 tablet 3   clopidogrel  (PLAVIX ) 75 MG tablet Take 1 tablet (75 mg total) by mouth daily. 90 tablet 3   dorzolamide -timolol  (COSOPT ) 2-0.5 % ophthalmic solution Place 1 drop into both eyes 2 (two) times daily. 15 mL 12    empagliflozin  (JARDIANCE ) 10 MG TABS tablet Take 1 tablet (10 mg total) by mouth daily before breakfast. 90 tablet 3   eplerenone  (INSPRA ) 25 MG tablet Take 1/2  tablet (12.5 mg total) by mouth daily. 45 tablet 3   furosemide  (LASIX ) 40 MG tablet Take 1 tablet (40 mg total) by mouth as needed (for weight gain of 3 lbs in a day or 5 lbs in a week). 30 tablet 1   glucose blood test strip USE TO CHECK BLOOD SUGAR ONCE DAILY 100 strip 12   Insulin  Pen Needle (UNIFINE PENTIPS) 31G X 5 MM MISC Use daily as directed 100 each 5   Lancets Misc. (ACCU-CHEK FASTCLIX LANCET) KIT Use as directed once daily 1 kit 1   latanoprost  (XALATAN ) 0.005 % ophthalmic solution Place 1 drop into both eyes every evening. 7.5 mL 12   lidocaine  (XYLOCAINE ) 5 % ointment Apply 1 Application topically as needed. 35.44 g 3   metFORMIN  (GLUCOPHAGE ) 500 MG tablet Take 1 tablet (500 mg total) by mouth 2 (two) times daily with a meal. 180 tablet 3   nitroGLYCERIN  (NITROSTAT ) 0.4 MG SL tablet Use one pill every 5 minutes X 3 if needed for chest pain. Contact MD if pain does not improve 30 tablet 0   sacubitril -valsartan  (ENTRESTO ) 49-51 MG Take 1 tablet by mouth 2 (two) times daily. 60 tablet 11   vitamin B-12 (CYANOCOBALAMIN ) 1000 MCG tablet Take 1 tablet (1,000 mcg total) by mouth daily. 90 tablet 1   No current facility-administered medications on file prior to visit.    Allergies  Allergen Reactions   Bidil [Isosorb Dinitrate-Hydralazine ] Other (See Comments)    Patient became lightheaded with 1 tablet tid.    Social History   Occupational History   Occupation: TEFL teacher: [employer]  Tobacco Use   Smoking status: Every Day    Current packs/day: 0.30    Types: Cigarettes   Smokeless tobacco: Never  Vaping Use   Vaping status: Never Used  Substance and Sexual Activity   Alcohol use: No    Alcohol/week: 0.0 standard drinks of alcohol   Drug use: No   Sexual activity: Not on file    Family History  Problem Relation Age of Onset   Diabetes Mother    Cancer Neg Hx    Immunization History  Administered Date(s) Administered   Fluad Quad(high Dose 65+) 05/12/2021, 07/13/2022   Fluad Trivalent(High Dose 65+) 09/20/2023   Influenza,inj,Quad PF,6+ Mos 10/06/2016, 04/12/2017, 05/01/2018, 07/10/2019, 07/01/2020   PFIZER(Purple Top)SARS-COV-2 Vaccination 10/23/2019, 11/13/2019, 07/15/2020   Pneumococcal Conjugate-13 12/07/2016   Pneumococcal Polysaccharide-23 02/28/2018   Tdap 01/12/2022     Review of Systems: Negative except as noted in the HPI.   Objective: There were no vitals  filed for this visit.  Eddie [name] is a pleasant 73 y.o. male in NAD. AAO X 3.  Diabetic foot exam was performed with the following findings:   Vascular Examination: CFT <4 seconds b/l. DP pulses diminished b/l. PT pulses diminished b/l. Digital hair absent. Skin temperature gradient warm to cool b/l. No cyanosis or clubbing noted b/l.    Neurological Examination: Protective sensation diminished with 10g monofilament b/l.  Dermatological Examination: Pedal skin thin, shiny and atrophic b/l. No interdigital macerations.   Toenails 1-5 b/l thick, discolored, elongated with subungual debris and pain on dorsal palpation.        Ischemic wound measuring 3.0 x 4.0 cm with fibrous center. No edema. There is mild erythema. There is tenderness to palpation of the wound. No purulence, no edema, no odor.  Musculoskeletal Examination: Muscle strength 5/5 to all lower extremity muscle groups bilaterally. Hammertoe(s) 2-5 bilaterally.  Radiographs: None     Lab Results  Component Value Date   HGBA1C 6.3 (A) 09/20/2023   ADA Risk Categorization: High Risk  Patient has one or more of the following: Loss of protective sensation Absent pedal pulses Severe Foot deformity History of foot ulcer  Assessment: 1. Pain due to onychomycosis of toenails of both feet   2. Open wound of left  foot, initial encounter   3. Critical limb ischemia of left lower extremity (HCC)   4. Type 2 diabetes mellitus with diabetic neuropathy, with long-term current use of insulin  (HCC)   5. Encounter for diabetic foot exam (HCC)     Plan: Meds ordered this encounter  Medications   doxycycline  (VIBRAMYCIN ) 100 MG capsule    Sig: Take 1 capsule (100 mg total) by mouth 2 (two) times daily for 10 days.    Dispense:  20 capsule    Refill:  0   gentamicin cream (GARAMYCIN) 0.1 %    Sig: Apply to affected area once daily.    Dispense:  30 g    Refill:  1  Plan: -Patient was evaluated and treated and all questions answered.  -Left heel cleansed with antibacterial soap. Area dabbed dry with sterile gauze. Applied betadine solution and light dressing.He is to clean area once daily with Dial antibacterial soap, dab dry with sterile gauze and apply a light amount of Gentamicin Cream. -Patient risk factors affecting healing of ulcer: diabetes, neuropathy, foot deformity, PAD with rest pain, h/o CVA, h/o MI, HTN, CAD, COPD, CKD, current tobacco user -Prescription written for Gentamicin Cream. Patient is to apply to wound left foot once daily and cover with dressing.  -Rx for Doxycyline 100 mg, #20, to be taken one capsule twice daily for 10 days. -Patient advised to see Vascular Surgery ASAP for evaluation. Follow up with Dr. Michalene Agee in one week for left heel wound. -Diabetic foot examination performed today. All patient's and/or POA's questions/concerns addressed on today's visit. Toenails 1-5 debrided in length and girth without incident. -Patient/POA to call should there be question/concern in the interim. Return in about 3 months (around 03/13/2024).  Luella Sager, DPM      [place] LOCATION: [address], [place] [address]  Office [phone]   [facility] LOCATION: [address] [place], [place]  [address] Office [phone]

## 2023-12-13 ENCOUNTER — Ambulatory Visit (HOSPITAL_COMMUNITY)
Admission: RE | Admit: 2023-12-13 | Discharge: 2023-12-13 | Disposition: A | Discharge status: Home / Self Care | Source: Ambulatory Visit | Attending: Vascular Surgery | Admitting: Vascular Surgery

## 2023-12-13 DIAGNOSIS — I739 Peripheral vascular disease, unspecified: Secondary | ICD-10-CM | POA: Diagnosis present

## 2023-12-13 LAB — VAS US ABI WITH/WO TBI

## 2023-12-13 NOTE — Progress Notes (Signed)
 Patient ID: Eddie [name], male   DOB: Aug 04, 1950, 73 y.o.   MRN: [id number]  Reason for Consult: No chief complaint on file.   Referred by Priscella Brooms, DO  Subjective:  HPI Eddie [name] is a 73 y.o. male presenting for follow-up of PAD.  He has known multilevel arterial occlusive disease.  He was seen 6 months ago and noted to have claudication which was not lifestyle limiting.  Today he reports his right leg claudication is much improved although he has been having left lateral ankle pain for about a month.  The pain is fairly constant and is near his lateral malleolus and heel.  He denies ulcerations or slow healing wounds.  He denies any dorsal foot or toe pain. He is compliant with aspirin , Plavix  and statin.  He is still smoking but reports 1 pack lasted about 2 months.  Past Medical History:  Diagnosis Date   Blindness of left eye    CAD (coronary artery disease)    a. STEMI 09/2015 w/ DES to Prox LAD   CVA (cerebral infarction)    a. 09/2015: acute small right frontal lobe infarct   Diabetes (HCC)    Hypertension    Ischemic cardiomyopathy    OSA (obstructive sleep apnea) 07/06/2017   Mild OSA with AHI of 8.2/hr with hypoxemia as low as 76% now on CPAP at 11cm H2O.   PUD (peptic ulcer disease)    ST elevation myocardial infarction involving left anterior descending (LAD) coronary artery (HCC) 09/28/2015   Family History  Problem Relation Age of Onset   Diabetes Mother    Cancer Neg Hx    Past Surgical History:  Procedure Laterality Date   CARDIAC CATHETERIZATION N/A 09/28/2015   Procedure: Left Heart Cath and Coronary Angiography;  Surgeon: Avanell Leigh, MD;  Location: [facility] INVASIVE CV LAB;  Service: Cardiovascular;  Laterality: N/A;   CARDIAC CATHETERIZATION N/A 10/08/2015   Procedure: Left Heart Cath and Coronary Angiography;  Surgeon: Arty Binning, MD;  Location: [facility] INVASIVE CV LAB;  Service: Cardiovascular;  Laterality: N/A;   CATARACT EXTRACTION Right    Repair  of Peptic Ulcer      Short Social History:  Social History   Tobacco Use   Smoking status: Every Day    Current packs/day: 0.30    Types: Cigarettes   Smokeless tobacco: Never  Substance Use Topics   Alcohol use: No    Alcohol/week: 0.0 standard drinks of alcohol    Allergies  Allergen Reactions   Bidil [Isosorb Dinitrate-Hydralazine ] Other (See Comments)    Patient became lightheaded with 1 tablet tid.     Current Outpatient Medications  Medication Sig Dispense Refill   Accu-Chek FastClix Lancets MISC Use to check blood sugar 1 time a day 102 each 3   Acetaminophen  325 MG CAPS Take 2 tablets by mouth every 4 (four) hours as needed.     albuterol  (VENTOLIN  HFA) 108 (90 Base) MCG/ACT inhaler Inhale 2 puffs into the lungs every 6 (six) hours as needed for wheezing or shortness of breath. 8.5 g 2   aspirin  EC 81 MG tablet Take 1 tablet (81 mg total) by mouth daily. 90 tablet 3   atorvastatin  (LIPITOR ) 80 MG tablet Take 1 tablet (80 mg total) by mouth daily. 90 tablet 3   carvedilol  (COREG ) 25 MG tablet Take 1 tablet (25 mg total) by mouth 2 (two) times daily. 180 tablet 3   clopidogrel  (PLAVIX ) 75 MG tablet Take 1  tablet (75 mg total) by mouth daily. 90 tablet 3   dorzolamide -timolol  (COSOPT ) 2-0.5 % ophthalmic solution Place 1 drop into both eyes 2 (two) times daily. 15 mL 12   doxycycline  (VIBRAMYCIN ) 100 MG capsule Take 1 capsule (100 mg total) by mouth 2 (two) times daily for 10 days. 20 capsule 0   empagliflozin  (JARDIANCE ) 10 MG TABS tablet Take 1 tablet (10 mg total) by mouth daily before breakfast. 90 tablet 3   eplerenone  (INSPRA ) 25 MG tablet Take 1/2  tablet (12.5 mg total) by mouth daily. 45 tablet 3   furosemide  (LASIX ) 40 MG tablet Take 1 tablet (40 mg total) by mouth as needed (for weight gain of 3 lbs in a day or 5 lbs in a week). 30 tablet 1   gentamicin cream (GARAMYCIN) 0.1 % Apply to affected area once daily. 30 g 1   glucose blood test strip USE TO CHECK BLOOD  SUGAR ONCE DAILY 100 strip 12   Insulin  Pen Needle (UNIFINE PENTIPS) 31G X 5 MM MISC Use daily as directed 100 each 5   Lancets Misc. (ACCU-CHEK FASTCLIX LANCET) KIT Use as directed once daily 1 kit 1   latanoprost  (XALATAN ) 0.005 % ophthalmic solution Place 1 drop into both eyes every evening. 7.5 mL 12   lidocaine  (XYLOCAINE ) 5 % ointment Apply 1 Application topically as needed. 35.44 g 3   metFORMIN  (GLUCOPHAGE ) 500 MG tablet Take 1 tablet (500 mg total) by mouth 2 (two) times daily with a meal. 180 tablet 3   nitroGLYCERIN  (NITROSTAT ) 0.4 MG SL tablet Use one pill every 5 minutes X 3 if needed for chest pain. Contact MD if pain does not improve 30 tablet 0   sacubitril -valsartan  (ENTRESTO ) 49-51 MG Take 1 tablet by mouth 2 (two) times daily. 60 tablet 11   vitamin B-12 (CYANOCOBALAMIN ) 1000 MCG tablet Take 1 tablet (1,000 mcg total) by mouth daily. 90 tablet 1   No current facility-administered medications for this visit.    REVIEW OF SYSTEMS  All other systems reviewed and are negative  Objective:  Objective   There were no vitals filed for this visit. There is no height or weight on file to calculate BMI.  Physical Exam General: no acute distress Cardiac: hemodynamically stable Pulm: normal work of breathing Abdomen: non-tender, no pulsatile mass Neuro: alert, no focal deficit Extremities: Some erythema of right lateral ankle, no edema, cyanosis or open sores. Vascular:   Right: Palpable femoral, nonpalpable pedal's  Left: Palpable femoral, nonpalpable pedal's  Data: ABI +---------+------------------+-----+----------+--------+  Right   Rt Pressure (mmHg)IndexWaveform  Comment   +---------+------------------+-----+----------+--------+  Brachial 151                                        +---------+------------------+-----+----------+--------+  PTA     194               1.28 monophasic          +---------+------------------+-----+----------+--------+   DP      254               1.68 monophasic          +---------+------------------+-----+----------+--------+  Great Toe75                0.50                     +---------+------------------+-----+----------+--------+   +---------+------------------+-----+----------+-------+  Left    Lt Pressure (mmHg)IndexWaveform  Comment  +---------+------------------+-----+----------+-------+  Brachial 140                                       +---------+------------------+-----+----------+-------+  PTA     254               1.68 monophasic         +---------+------------------+-----+----------+-------+  DP      149               0.99 monophasic         +---------+------------------+-----+----------+-------+  Great Toe54                0.36                    +---------+------------------+-----+----------+-------+   +-------+-----------+-----------+------------+------------+  ABI/TBIToday's ABIToday's TBIPrevious ABIPrevious TBI  +-------+-----------+-----------+------------+------------+  Right [place]         0.50       [place]          0.0           +-------+-----------+-----------+------------+------------+  Left  [place]         0.36       [place]          0.20          +-------+-----------+-----------+------------+------------+   Previously: Right [place] with TP 0, Left [place] with TP 28     Assessment/Plan:     Eddie [name] is a 73 y.o. male with PAD.  We discussed that as his claudication is better I do not believe there is a role for intervention at this time.  We also discussed that his toe pressures are actually improved from 6 months ago.  I explained that his left lateral ankle pain is atypical of arterial disease and I do not believe this to be related to his perfusion.  Will plan to follow-up again in 6 months with repeat ABI.  Explained that if his left foot continues to hurt or he develops toe pain or wounds that he should call for an earlier  appointment.     Philipp Brawn MD [facility]

## 2023-12-14 ENCOUNTER — Ambulatory Visit: Attending: Vascular Surgery | Admitting: Vascular Surgery

## 2023-12-14 ENCOUNTER — Encounter: Payer: Self-pay | Admitting: Vascular Surgery

## 2023-12-14 VITALS — BP 127/74 | HR 75 | Temp 98.2°F | Resp 20 | Ht 66.0 in | Wt 134.0 lb

## 2023-12-14 DIAGNOSIS — I739 Peripheral vascular disease, unspecified: Secondary | ICD-10-CM | POA: Diagnosis not present

## 2023-12-19 ENCOUNTER — Other Ambulatory Visit: Payer: Self-pay | Admitting: Vascular Surgery

## 2023-12-19 DIAGNOSIS — I739 Peripheral vascular disease, unspecified: Secondary | ICD-10-CM

## 2023-12-19 DIAGNOSIS — I70211 Atherosclerosis of native arteries of extremities with intermittent claudication, right leg: Secondary | ICD-10-CM

## 2023-12-22 ENCOUNTER — Other Ambulatory Visit (HOSPITAL_COMMUNITY): Payer: Self-pay | Admitting: Internal Medicine

## 2023-12-25 ENCOUNTER — Encounter: Payer: Self-pay | Admitting: Podiatry

## 2023-12-25 ENCOUNTER — Other Ambulatory Visit: Payer: Self-pay

## 2023-12-25 ENCOUNTER — Ambulatory Visit (INDEPENDENT_AMBULATORY_CARE_PROVIDER_SITE_OTHER): Admitting: Podiatry

## 2023-12-25 ENCOUNTER — Other Ambulatory Visit (HOSPITAL_COMMUNITY): Payer: Self-pay

## 2023-12-25 VITALS — Ht 66.0 in | Wt 134.0 lb

## 2023-12-25 DIAGNOSIS — L97422 Non-pressure chronic ulcer of left heel and midfoot with fat layer exposed: Secondary | ICD-10-CM | POA: Diagnosis not present

## 2023-12-25 MED ORDER — EMPAGLIFLOZIN 10 MG PO TABS
10.0000 mg | ORAL_TABLET | Freq: Every day | ORAL | 3 refills | Status: AC
  Filled 2023-12-25: qty 90, 90d supply, fill #0
  Filled 2024-03-22: qty 90, 90d supply, fill #1
  Filled 2024-06-28: qty 90, 90d supply, fill #2

## 2023-12-25 MED ORDER — LIDOCAINE 5 % EX OINT
1.0000 | TOPICAL_OINTMENT | CUTANEOUS | 0 refills | Status: DC | PRN
Start: 2023-12-25 — End: 2024-01-22
  Filled 2023-12-25: qty 35.44, 30d supply, fill #0

## 2023-12-25 NOTE — Progress Notes (Signed)
 Subjective:  Patient ID: Eddie [name], male    DOB: 09-19-1950,  MRN: [id number]  Chief Complaint  Patient presents with   Ingrown Toenail    Infection on left ankle and heel    73 y.o. male presents with the above complaint. History confirmed with patient.   Objective:  Physical Exam: Left foot pulses are nonpalpable there is mild edema, full-thickness ulcer on the lateral heel measuring 0.8 x 0.3 x 0.2 cm.  Exposed subcutaneous tissue tender to palpation. Surrounding hyperkeratosis no active drainage or signs of infection    Ra    LOWER EXTREMITY DOPPLER STUDY  Patient Name:  Eddie [name]  Date of Exam:   12/13/2023 Medical Rec #: [id number]      Accession #:    [id number] Date of Birth: 1950-08-31      Patient Gender: M Patient Age:   35 years Exam Location:  [address] Procedure:      VAS US  ABI WITH/WO TBI Referring Phys: JOSHUA ROBINS   --------------------------------------------------------------------------- -----   Indications: Peripheral artery disease.  High Risk Factors: Hypertension, coronary artery disease, prior CVA.    Comparison Study: 06/07/23  Performing Technologist: Aloma Arrant RVS    Examination Guidelines: A complete evaluation includes at minimum, Doppler waveform signals and systolic blood pressure reading at the level of bilateral brachial, anterior tibial, and posterior tibial arteries, when vessel segments are accessible. Bilateral testing is considered an integral part of a complete examination. Photoelectric Plethysmograph (PPG) waveforms and toe systolic pressure readings are included as required and additional duplex testing as needed. Limited examinations for reoccurring indications may be performed as noted.    ABI Findings: +---------+------------------+-----+----------+--------+ Right    Rt Pressure (mmHg)IndexWaveform  Comment  +---------+------------------+-----+----------+--------+ Brachial 151                                        +---------+------------------+-----+----------+--------+ PTA      194               1.28 monophasic         +---------+------------------+-----+----------+--------+ DP       254               1.68 monophasic         +---------+------------------+-----+----------+--------+ Great Toe75                0.50                    +---------+------------------+-----+----------+--------+  +---------+------------------+-----+----------+-------+ Left     Lt Pressure (mmHg)IndexWaveform  Comment +---------+------------------+-----+----------+-------+ Brachial 140                                      +---------+------------------+-----+----------+-------+ PTA      254               1.68 monophasic        +---------+------------------+-----+----------+-------+ DP       149               0.99 monophasic        +---------+------------------+-----+----------+-------+ Great Toe54                0.36                   +---------+------------------+-----+----------+-------+  +-------+-----------+-----------+------------+------------+ ABI/TBIToday's ABIToday's  TBIPrevious ABIPrevious TBI +-------+-----------+-----------+------------+------------+ Right  [place]         0.50       [place]          0.0          +-------+-----------+-----------+------------+------------+ Left   [place]         0.36       [place]          0.20         +-------+-----------+-----------+------------+------------+      Bilateral ABIs appear essentially unchanged compared to prior study on 06/07/23.   Summary: Right: Resting right ankle-brachial index indicates noncompressible right lower extremity arteries. The right toe-brachial index is abnormal.  Left: Resting left ankle-brachial index indicates noncompressible left lower extremity arteries. The left toe-brachial index is abnormal.   Assessment:   1. Ischemic ulcer of left heel with  fat layer exposed (HCC)      Plan:  Patient was evaluated and treated and all questions answered.  Full-thickness ulcer on the lateral heel ischemic in nature I debrided the lesion after applying lidocaine  prilocaine ointment today and we refilled his lidocaine  ointment to utilize as well as the gentamicin  ointment.  Once he completes his current doxycycline  course should not need further antibiotics unless it worsens and he will notify me if this happens.  He saw vascular surgery recently and they are pleased with perfusion that this should heal I will continue local wound care for now emphasized the importance of offloading at all times and he will follow-up me in 4 weeks for ongoing wound care. Return in about 4 weeks (around 01/22/2024) for wound care.

## 2023-12-25 NOTE — Patient Instructions (Signed)

## 2024-01-05 ENCOUNTER — Other Ambulatory Visit (HOSPITAL_COMMUNITY): Payer: Self-pay | Admitting: Internal Medicine

## 2024-01-07 ENCOUNTER — Other Ambulatory Visit (HOSPITAL_COMMUNITY): Payer: Self-pay

## 2024-01-07 ENCOUNTER — Ambulatory Visit: Payer: 59

## 2024-01-07 ENCOUNTER — Encounter (HOSPITAL_COMMUNITY): Payer: 59

## 2024-01-07 MED ORDER — FUROSEMIDE 40 MG PO TABS
40.0000 mg | ORAL_TABLET | ORAL | 3 refills | Status: AC | PRN
  Filled 2024-01-07: qty 30, 30d supply, fill #0
  Filled 2024-02-07: qty 30, 30d supply, fill #1
  Filled 2024-06-28: qty 30, 30d supply, fill #2

## 2024-01-08 ENCOUNTER — Ambulatory Visit: Admitting: Gastroenterology

## 2024-01-10 ENCOUNTER — Ambulatory Visit (HOSPITAL_COMMUNITY): Payer: 59

## 2024-01-10 ENCOUNTER — Ambulatory Visit: Payer: 59

## 2024-01-15 ENCOUNTER — Encounter (HOSPITAL_COMMUNITY): Payer: Self-pay

## 2024-01-15 ENCOUNTER — Other Ambulatory Visit (HOSPITAL_COMMUNITY): Payer: Self-pay

## 2024-01-15 ENCOUNTER — Ambulatory Visit (HOSPITAL_COMMUNITY)
Admission: RE | Admit: 2024-01-15 | Discharge: 2024-01-15 | Disposition: A | Discharge status: Home / Self Care | Source: Ambulatory Visit | Attending: Physician Assistant | Admitting: Physician Assistant

## 2024-01-15 ENCOUNTER — Ambulatory Visit (HOSPITAL_COMMUNITY): Payer: Self-pay | Admitting: Physician Assistant

## 2024-01-15 VITALS — BP 104/60 | HR 80 | Ht 66.0 in | Wt 127.2 lb

## 2024-01-15 DIAGNOSIS — I5022 Chronic systolic (congestive) heart failure: Secondary | ICD-10-CM | POA: Diagnosis present

## 2024-01-15 DIAGNOSIS — I10 Essential (primary) hypertension: Secondary | ICD-10-CM

## 2024-01-15 DIAGNOSIS — I130 Hypertensive heart and chronic kidney disease with heart failure and stage 1 through stage 4 chronic kidney disease, or unspecified chronic kidney disease: Secondary | ICD-10-CM | POA: Insufficient documentation

## 2024-01-15 DIAGNOSIS — Z7901 Long term (current) use of anticoagulants: Secondary | ICD-10-CM | POA: Insufficient documentation

## 2024-01-15 DIAGNOSIS — I5042 Chronic combined systolic (congestive) and diastolic (congestive) heart failure: Secondary | ICD-10-CM

## 2024-01-15 DIAGNOSIS — Z7984 Long term (current) use of oral hypoglycemic drugs: Secondary | ICD-10-CM | POA: Diagnosis not present

## 2024-01-15 DIAGNOSIS — Z955 Presence of coronary angioplasty implant and graft: Secondary | ICD-10-CM | POA: Diagnosis not present

## 2024-01-15 DIAGNOSIS — Z720 Tobacco use: Secondary | ICD-10-CM | POA: Diagnosis not present

## 2024-01-15 DIAGNOSIS — Z716 Tobacco abuse counseling: Secondary | ICD-10-CM | POA: Insufficient documentation

## 2024-01-15 DIAGNOSIS — Z91199 Patient's noncompliance with other medical treatment and regimen due to unspecified reason: Secondary | ICD-10-CM | POA: Insufficient documentation

## 2024-01-15 DIAGNOSIS — F17210 Nicotine dependence, cigarettes, uncomplicated: Secondary | ICD-10-CM | POA: Insufficient documentation

## 2024-01-15 DIAGNOSIS — N1832 Chronic kidney disease, stage 3b: Secondary | ICD-10-CM | POA: Diagnosis not present

## 2024-01-15 DIAGNOSIS — Z8673 Personal history of transient ischemic attack (TIA), and cerebral infarction without residual deficits: Secondary | ICD-10-CM | POA: Insufficient documentation

## 2024-01-15 DIAGNOSIS — I252 Old myocardial infarction: Secondary | ICD-10-CM | POA: Insufficient documentation

## 2024-01-15 DIAGNOSIS — E1122 Type 2 diabetes mellitus with diabetic chronic kidney disease: Secondary | ICD-10-CM | POA: Diagnosis not present

## 2024-01-15 DIAGNOSIS — Z7902 Long term (current) use of antithrombotics/antiplatelets: Secondary | ICD-10-CM | POA: Insufficient documentation

## 2024-01-15 DIAGNOSIS — I5032 Chronic diastolic (congestive) heart failure: Secondary | ICD-10-CM | POA: Diagnosis not present

## 2024-01-15 DIAGNOSIS — G4733 Obstructive sleep apnea (adult) (pediatric): Secondary | ICD-10-CM | POA: Diagnosis not present

## 2024-01-15 DIAGNOSIS — Z794 Long term (current) use of insulin: Secondary | ICD-10-CM | POA: Insufficient documentation

## 2024-01-15 DIAGNOSIS — E1151 Type 2 diabetes mellitus with diabetic peripheral angiopathy without gangrene: Secondary | ICD-10-CM | POA: Diagnosis not present

## 2024-01-15 DIAGNOSIS — Z79899 Other long term (current) drug therapy: Secondary | ICD-10-CM | POA: Diagnosis not present

## 2024-01-15 DIAGNOSIS — I2510 Atherosclerotic heart disease of native coronary artery without angina pectoris: Secondary | ICD-10-CM | POA: Diagnosis not present

## 2024-01-15 LAB — LIPID PANEL
Cholesterol: 110 mg/dL (ref 0–200)
HDL: 35 mg/dL — ABNORMAL LOW (ref >40)
LDL Cholesterol: 63 mg/dL (ref 0–99)
Total CHOL/HDL Ratio: 3.1 ratio
Triglycerides: 62 mg/dL (ref <150)
VLDL: 12 mg/dL (ref 0–40)

## 2024-01-15 LAB — BASIC METABOLIC PANEL WITH GFR
Anion gap: 11 (ref 5–15)
BUN: 38 mg/dL — ABNORMAL HIGH (ref 8–23)
CO2: 23 mmol/L (ref 22–32)
Calcium: 8.7 mg/dL — ABNORMAL LOW (ref 8.9–10.3)
Chloride: 105 mmol/L (ref 98–111)
Creatinine, Ser: 2.95 mg/dL — ABNORMAL HIGH (ref 0.61–1.24)
GFR, Estimated: 22 mL/min — ABNORMAL LOW (ref >60)
Glucose, Bld: 144 mg/dL — ABNORMAL HIGH (ref 70–99)
Potassium: 4 mmol/L (ref 3.5–5.1)
Sodium: 139 mmol/L (ref 135–145)

## 2024-01-15 LAB — BRAIN NATRIURETIC PEPTIDE: B Natriuretic Peptide: 134.7 pg/mL — ABNORMAL HIGH (ref 0.0–100.0)

## 2024-01-15 MED ORDER — EZETIMIBE 10 MG PO TABS
10.0000 mg | ORAL_TABLET | Freq: Every day | ORAL | 3 refills | Status: AC
  Filled 2024-01-15: qty 90, 90d supply, fill #0
  Filled 2024-04-05: qty 90, 90d supply, fill #1
  Filled 2024-06-28: qty 90, 90d supply, fill #2

## 2024-01-15 NOTE — Patient Instructions (Signed)
 There has been no changes to your medications.  Labs done today, your results will be available in MyChart, we will contact you for abnormal readings.  Your physician has requested that you have an echocardiogram. Echocardiography is a painless test that uses sound waves to create images of your heart. It provides your doctor with information about the size and shape of your heart and how well your heart's chambers and valves are working. This procedure takes approximately one hour. There are no restrictions for this procedure. Please do NOT wear cologne, perfume, aftershave, or lotions (deodorant is allowed). Please arrive 15 minutes prior to your appointment time.  Please note: We ask at that you not bring children with you during ultrasound (echo/ vascular) testing. Due to room size and safety concerns, children are not allowed in the ultrasound rooms during exams. Our front office staff cannot provide observation of children in our lobby area while testing is being conducted. An adult accompanying a patient to their appointment will only be allowed in the ultrasound room at the discretion of the ultrasound technician under special circumstances. We apologize for any inconvenience.  Your physician recommends that you schedule a follow-up appointment in: 9 months with an echocardiogram (March 2026) ** PLEASE CALL THE OFFICE IN [place] TO ARRANGE YOUR FOLLOW UP APPOINTMENT.**  If you have any questions or concerns before your next appointment please send us  a message through [place] or call our office at [phone].    TO LEAVE A MESSAGE FOR THE NURSE SELECT OPTION 2, PLEASE LEAVE A MESSAGE INCLUDING: YOUR NAME DATE OF BIRTH CALL BACK NUMBER REASON FOR CALL**this is important as we prioritize the call backs  YOU WILL RECEIVE A CALL BACK THE SAME DAY AS LONG AS YOU CALL BEFORE 4:00 PM  At the [facility], you and your health needs are our priority. As part of our continuing  mission to provide you with exceptional heart care, we have created designated Provider Care Teams. These Care Teams include your primary Cardiologist (physician) and Advanced Practice Providers (APPs- Physician Assistants and Nurse Practitioners) who all work together to provide you with the care you need, when you need it.   You may see any of the following providers on your designated Care Team at your next follow up: Dr Toribio Fuel Dr Ezra Shuck Dr. Ria Commander Dr. Morene Brownie Amy Lenetta, NP Caffie Shed, [place] [facility] J.F. Villareal, [place] Beckey Coe, NP [place] Lee, NP Ellouise Class, NP Tinnie Redman, PharmD Jaun Bash, PharmD   Please be sure to bring in all your medications bottles to every appointment.    Thank you for choosing [facility]

## 2024-01-15 NOTE — Progress Notes (Addendum)
 [facility] Note   PCP: Rosan Pride HF MD: Dr Cherrie  Chief Complaint: CHF  HPI: Eddie [name] is a 73 y.o. male  with h/o DM2, CAD s/p NSTEMI with LAD stent in 3/17, CVA, chronic systolic HF now with recovered EF, PAD, tobacco use.   Had multiple admissions in May and June 2017 in the setting of decompensated CHF and EColi bacteremia. Readmitted in June 2017 with acute respiratory failure requiring intubation in setting of severe HTN and ADHF. Treated with milrinone which was eventually weaned off.     He is here today for CHF follow-up. Had a little bit of shortness of breath sitting outside on the porch in extreme heat yesterday. Otherwise, has not noticed much dyspnea. No orthopnea, PND or lower extremity edema. Reports mobility has been limited by lower extremity pain. He has a left lateral foot wound that is being treated by Podiatry. Recently completed antibiotics and has f/u next week. He is compliant with all medications.   He has cut back smoking down to 3 cigarettes/day. Has been a smoker for > 50 years.   Cardiac Studies Echo (07/24): EF 45-50%, RV ok Echo (2/22): EF 50-55% Echo (11/20): EF 40-45%  Echo (12/19): EF 30-35%  Echo (6/18): EF 40%.   RV ok.  Echo (11/17): EF 20-25%. Cannot exclude small  laminar apical thrombus. No mobile or protuberant thrombus is  seen. Echo (5/15): EF 15-20%  Review of systems complete and found to be negative unless listed in HPI.    Past Medical History:  Diagnosis Date   Blindness of left eye    CAD (coronary artery disease)    a. STEMI 09/2015 w/ DES to Prox LAD   CVA (cerebral infarction)    a. 09/2015: acute small right frontal lobe infarct   Diabetes (HCC)    Hypertension    Ischemic cardiomyopathy    OSA (obstructive sleep apnea) 07/06/2017   Mild OSA with AHI of 8.2/hr with hypoxemia as low as 76% now on CPAP at 11cm H2O.   Peripheral vascular disease (HCC)    PUD (peptic ulcer disease)    ST  elevation myocardial infarction involving left anterior descending (LAD) coronary artery (HCC) 09/28/2015   Current Outpatient Medications  Medication Sig Dispense Refill   Accu-Chek FastClix Lancets MISC Use to check blood sugar 1 time a day 102 each 3   Acetaminophen  325 MG CAPS Take 2 tablets by mouth every 4 (four) hours as needed.     albuterol  (VENTOLIN  HFA) 108 (90 Base) MCG/ACT inhaler Inhale 2 puffs into the lungs every 6 (six) hours as needed for wheezing or shortness of breath. 8.5 g 2   aspirin  EC 81 MG tablet Take 1 tablet (81 mg total) by mouth daily. 90 tablet 3   atorvastatin  (LIPITOR ) 80 MG tablet Take 1 tablet (80 mg total) by mouth daily. 90 tablet 3   carvedilol  (COREG ) 25 MG tablet Take 1 tablet (25 mg total) by mouth 2 (two) times daily. 180 tablet 3   clopidogrel  (PLAVIX ) 75 MG tablet Take 1 tablet (75 mg total) by mouth daily. 90 tablet 3   dorzolamide -timolol  (COSOPT ) 2-0.5 % ophthalmic solution Place 1 drop into both eyes 2 (two) times daily. 15 mL 12   empagliflozin  (JARDIANCE ) 10 MG TABS tablet Take 1 tablet (10 mg total) by mouth daily before breakfast. 90 tablet 3   eplerenone  (INSPRA ) 25 MG tablet Take 1/2  tablet (12.5 mg total) by mouth daily.  45 tablet 3   furosemide  (LASIX ) 40 MG tablet Take 1 tablet (40 mg total) by mouth as needed (for weight gain of 3 lbs in a day or 5 lbs in a week). 30 tablet 3   gentamicin  cream (GARAMYCIN ) 0.1 % Apply to affected area once daily. 30 g 1   glucose blood test strip USE TO CHECK BLOOD SUGAR ONCE DAILY 100 strip 12   Insulin  Pen Needle (UNIFINE PENTIPS) 31G X 5 MM MISC Use daily as directed 100 each 5   Lancets Misc. (ACCU-CHEK FASTCLIX LANCET) KIT Use as directed once daily 1 kit 1   latanoprost  (XALATAN ) 0.005 % ophthalmic solution Place 1 drop into both eyes every evening. 7.5 mL 12   lidocaine  (XYLOCAINE ) 5 % ointment Apply 1 Application topically as needed. 35.44 g 3   lidocaine  (XYLOCAINE ) 5 % ointment Apply 1  Application topically as needed. 35.44 g 0   metFORMIN  (GLUCOPHAGE ) 500 MG tablet Take 1 tablet (500 mg total) by mouth 2 (two) times daily with a meal. 180 tablet 3   nitroGLYCERIN  (NITROSTAT ) 0.4 MG SL tablet Use one pill every 5 minutes X 3 if needed for chest pain. Contact MD if pain does not improve 30 tablet 0   sacubitril -valsartan  (ENTRESTO ) 49-51 MG Take 1 tablet by mouth 2 (two) times daily. 60 tablet 11   vitamin B-12 (CYANOCOBALAMIN ) 1000 MCG tablet Take 1 tablet (1,000 mcg total) by mouth daily. 90 tablet 1   No current facility-administered medications for this visit.   Allergies  Allergen Reactions   Bidil [Isosorb Dinitrate-Hydralazine ] Other (See Comments)    Patient became lightheaded with 1 tablet tid.     Social History   Socioeconomic History   Marital status: Married    Spouse name: Stage manager   Number of children: 2   Years of education: Not on file   Highest education level: Not on file  Occupational History   Occupation: TEFL teacher: [employer]  Tobacco Use   Smoking status: Every Day    Current packs/day: 0.30    Types: Cigarettes   Smokeless tobacco: Never  Vaping Use   Vaping status: Never Used  Substance and Sexual Activity   Alcohol use: No    Alcohol/week: 0.0 standard drinks of alcohol   Drug use: No   Sexual activity: Not on file  Other Topics Concern   Not on file  Social History Narrative   Lives with wife.   Social Drivers of Corporate investment banker Strain: Low Risk  (07/04/2023)   Overall Financial Resource Strain (CARDIA)    Difficulty of Paying Living Expenses: Not hard at all  Food Insecurity: Food Insecurity Present (07/04/2023)   Hunger Vital Sign    Worried About Running Out of Food in the Last Year: Often true    Ran Out of Food in the Last Year: Often true  Transportation Needs: No Transportation Needs (07/04/2023)   PRAPARE - Administrator, Civil Service (Medical): No    Lack of  Transportation (Non-Medical): No  Physical Activity: Inactive (07/04/2023)   Exercise Vital Sign    Days of Exercise per Week: 0 days    Minutes of Exercise per Session: 0 min  Stress: No Stress Concern Present (07/04/2023)   [employer] of Occupational Health - Occupational Stress Questionnaire    Feeling of Stress : Only a little  Social Connections: Socially Isolated (07/04/2023)   Social Connection and  Isolation Panel    Frequency of Communication with Friends and Family: Twice a week    Frequency of Social Gatherings with Friends and Family: Never    Attends Religious Services: Never    Database administrator or Organizations: No    Attends Banker Meetings: Never    Marital Status: Married  Catering manager Violence: Not At Risk (07/04/2023)   Humiliation, Afraid, Rape, and Kick questionnaire    Fear of Current or Ex-Partner: No    Emotionally Abused: No    Physically Abused: No    Sexually Abused: No   Family History  Problem Relation Age of Onset   Diabetes Mother    Cancer Neg Hx     There were no vitals taken for this visit.  Wt Readings from Last 3 Encounters:  12/25/23 60.8 kg (134 lb)  12/14/23 60.8 kg (134 lb)  09/20/23 61.1 kg (134 lb 12.8 oz)    PHYSICAL EXAM: General:  Thin, elderly male. Arrived in wheelchair.  Neck: no JVD. Cor: Regular rate & rhythm. No rubs, gallops or murmurs. Lungs: clear Abdomen: soft, nontender, nondistended.  Extremities: offloading boot on left foot Neuro: alert & orientedx3. Affect pleasant    ASSESSMENT & PLAN: 1. Chronic systolic HF with recovered EF :  - ICM May 2017 EF 15-20% and 12/17 EF 25-30%.  - Echo (6/18): EF improved 40% - Echo (12/19): EF 30-35%. - Echo (11/20): EF 40-45% - Echo (2/22): EF 50-55% - Echo (07/24): EF 45-50% - NYHA II. Mobility predominately limited by leg pain. Volume looks good today.  - Continue 40 mg lasix  PRN - Continue Entresto  49/51 mg BID - Continue Coreg  25 mg  BID - Continue Inspra  12.5 mg daily - Continue Jardiance  10 mg daily - Intolerant to Bidil in the past with dizziness and headaches.  - Labs today. - Echo with follow-up in 9 months   2. CAD: s/p DES to proximal LAD in 09/2015  - No s/s angina - Continue ASA, Plavix  and atorvastatin .  - Check lipid panel  3. HTN - BP at goal.  - Continue above meds.  4. 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  - He has continued on DAPT  5. DMII - Per PCP. Last A1c was 6.3 in 02/25. - Continue Jardiance  10 mg daily - No change  6. OSA  - Continue CPAP  7.  Tobacco use - Down to 3 cigarettes daily - Discussed smoking cessation, he states he plans to quit  8. CKD Stage IIIb - Baseline SCr low 2s - Continue Jardiance  - Labs today  9. PAD Left lateral food wound -Following with [facility] and podiatry -Continues on aspirin  + plavix  + statin  Follow-up: 9 months with Dr. Cherrie with echo  Buffey Zabinski N, PA-C  7:03 AM

## 2024-01-22 ENCOUNTER — Encounter: Payer: Self-pay | Admitting: Podiatry

## 2024-01-22 ENCOUNTER — Ambulatory Visit (INDEPENDENT_AMBULATORY_CARE_PROVIDER_SITE_OTHER): Admitting: Podiatry

## 2024-01-22 ENCOUNTER — Ambulatory Visit: Payer: Self-pay | Admitting: Physician Assistant

## 2024-01-22 ENCOUNTER — Ambulatory Visit (HOSPITAL_COMMUNITY)
Admission: RE | Admit: 2024-01-22 | Discharge: 2024-01-22 | Disposition: A | Discharge status: Home / Self Care | Source: Ambulatory Visit | Attending: Cardiology | Admitting: Cardiology

## 2024-01-22 ENCOUNTER — Other Ambulatory Visit (HOSPITAL_COMMUNITY): Payer: Self-pay

## 2024-01-22 VITALS — Ht 66.0 in | Wt 127.2 lb

## 2024-01-22 DIAGNOSIS — L97422 Non-pressure chronic ulcer of left heel and midfoot with fat layer exposed: Secondary | ICD-10-CM

## 2024-01-22 DIAGNOSIS — N184 Chronic kidney disease, stage 4 (severe): Secondary | ICD-10-CM

## 2024-01-22 DIAGNOSIS — I5042 Chronic combined systolic (congestive) and diastolic (congestive) heart failure: Secondary | ICD-10-CM | POA: Insufficient documentation

## 2024-01-22 LAB — BASIC METABOLIC PANEL WITH GFR
Anion gap: 10 (ref 5–15)
BUN: 39 mg/dL — ABNORMAL HIGH (ref 8–23)
CO2: 26 mmol/L (ref 22–32)
Calcium: 8.7 mg/dL — ABNORMAL LOW (ref 8.9–10.3)
Chloride: 107 mmol/L (ref 98–111)
Creatinine, Ser: 2.91 mg/dL — ABNORMAL HIGH (ref 0.61–1.24)
GFR, Estimated: 22 mL/min — ABNORMAL LOW (ref >60)
Glucose, Bld: 140 mg/dL — ABNORMAL HIGH (ref 70–99)
Potassium: 4.2 mmol/L (ref 3.5–5.1)
Sodium: 143 mmol/L (ref 135–145)

## 2024-01-22 MED ORDER — LIDOCAINE 5 % EX OINT
1.0000 | TOPICAL_OINTMENT | CUTANEOUS | 3 refills | Status: DC | PRN
  Filled 2024-01-22: qty 35.44, 30d supply, fill #0
  Filled 2024-02-19: qty 35.44, 30d supply, fill #1
  Filled 2024-04-28: qty 35.44, 30d supply, fill #2
  Filled 2024-07-04: qty 35.44, 30d supply, fill #3

## 2024-01-22 NOTE — Progress Notes (Signed)
 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 male presents with the above complaint. History confirmed with patient.   Objective:  Physical Exam: Left foot pulses are nonpalpable there is mild edema, full-thickness ulcer on the lateral heel measuring 0.2 x 0.3 x 0.2 cm.  Exposed subcutaneous tissue tender to palpation. Surrounding hyperkeratosis no active drainage or signs of infection    Ra    LOWER EXTREMITY DOPPLER STUDY  Patient Name:  Eddie [name]  Date of Exam:   12/13/2023 Medical Rec #: [id number]      Accession #:    [id number] Date of Birth: [birth year]      Patient Gender: M Patient Age:   22 years Exam Location:  [address] Procedure:      VAS US  ABI WITH/WO TBI Referring Phys: JOSHUA ROBINS   --------------------------------------------------------------------------- -----   Indications: Peripheral artery disease.  High Risk Factors: Hypertension, coronary artery disease, prior CVA.    Comparison Study: 06/07/23  Performing Technologist: Duwaine Hives RVS    Examination Guidelines: A complete evaluation includes at minimum, Doppler waveform signals and systolic blood pressure reading at the level of bilateral brachial, anterior tibial, and posterior tibial arteries, when vessel segments are accessible. Bilateral testing is considered an integral part of a complete examination. Photoelectric Plethysmograph (PPG) waveforms and toe systolic pressure readings are included as required and additional duplex testing as needed. Limited examinations for reoccurring indications may be performed as noted.    ABI Findings: +---------+------------------+-----+----------+--------+ Right    Rt Pressure (mmHg)IndexWaveform  Comment   +---------+------------------+-----+----------+--------+ Brachial 151                                       +---------+------------------+-----+----------+--------+ PTA      194               1.28 monophasic         +---------+------------------+-----+----------+--------+ DP       254               1.68 monophasic         +---------+------------------+-----+----------+--------+ Great Toe75                0.50                    +---------+------------------+-----+----------+--------+  +---------+------------------+-----+----------+-------+ Left     Lt Pressure (mmHg)IndexWaveform  Comment +---------+------------------+-----+----------+-------+ Brachial 140                                      +---------+------------------+-----+----------+-------+ PTA      254               1.68 monophasic        +---------+------------------+-----+----------+-------+ DP       149               0.99 monophasic        +---------+------------------+-----+----------+-------+ Great Toe54                0.36                   +---------+------------------+-----+----------+-------+  +-------+-----------+-----------+------------+------------+  ABI/TBIToday's ABIToday's TBIPrevious ABIPrevious TBI +-------+-----------+-----------+------------+------------+ Right  [place]         0.50       [place]          0.0          +-------+-----------+-----------+------------+------------+ Left   [place]         0.36       [place]          0.20         +-------+-----------+-----------+------------+------------+      Bilateral ABIs appear essentially unchanged compared to prior study on 06/07/23.   Summary: Right: Resting right ankle-brachial index indicates noncompressible right lower extremity arteries. The right toe-brachial index is abnormal.  Left: Resting left ankle-brachial index indicates noncompressible left lower extremity arteries. The left  toe-brachial index is abnormal.   Assessment:   1. 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  Doing well and appears to be healing with slow but steady progress.  Reduction in dimensions noted today.  No debridement performed.  Return in 1 month to reevaluate we may perform debridement over local anesthetic.  Refilled his lidocaine  ointment.  Continue use of surgical shoe for offloading.  Return in about 1 month (around 02/22/2024) for wound care.

## 2024-01-24 ENCOUNTER — Other Ambulatory Visit (HOSPITAL_COMMUNITY): Payer: Self-pay

## 2024-02-07 ENCOUNTER — Other Ambulatory Visit: Payer: Self-pay

## 2024-02-10 ENCOUNTER — Other Ambulatory Visit: Payer: Self-pay

## 2024-02-13 ENCOUNTER — Other Ambulatory Visit (HOSPITAL_COMMUNITY): Payer: Self-pay

## 2024-02-13 ENCOUNTER — Other Ambulatory Visit: Payer: Self-pay

## 2024-02-13 ENCOUNTER — Telehealth: Payer: Self-pay | Admitting: Internal Medicine

## 2024-02-13 ENCOUNTER — Other Ambulatory Visit: Payer: Self-pay | Admitting: Internal Medicine

## 2024-02-13 DIAGNOSIS — I2510 Atherosclerotic heart disease of native coronary artery without angina pectoris: Secondary | ICD-10-CM

## 2024-02-13 DIAGNOSIS — I5042 Chronic combined systolic (congestive) and diastolic (congestive) heart failure: Secondary | ICD-10-CM

## 2024-02-13 MED ORDER — ATORVASTATIN CALCIUM 80 MG PO TABS
80.0000 mg | ORAL_TABLET | Freq: Every day | ORAL | 0 refills | Status: DC
  Filled 2024-02-13: qty 90, 90d supply, fill #0

## 2024-02-13 MED ORDER — CLOPIDOGREL BISULFATE 75 MG PO TABS
75.0000 mg | ORAL_TABLET | Freq: Every day | ORAL | 0 refills | Status: DC
  Filled 2024-02-13 – 2024-04-05 (×2): qty 90, 90d supply, fill #0

## 2024-02-13 MED ORDER — GLUCOSE BLOOD VI STRP
ORAL_STRIP | Freq: Every day | 12 refills | Status: AC
  Filled 2024-02-13: qty 100, 90d supply, fill #0
  Filled 2024-05-26: qty 100, 90d supply, fill #1

## 2024-02-13 MED ORDER — METFORMIN HCL 500 MG PO TABS
500.0000 mg | ORAL_TABLET | Freq: Two times a day (BID) | ORAL | 0 refills | Status: DC
  Filled 2024-02-13: qty 180, 90d supply, fill #0

## 2024-02-13 MED ORDER — CARVEDILOL 25 MG PO TABS
25.0000 mg | ORAL_TABLET | Freq: Two times a day (BID) | ORAL | 0 refills | Status: DC
Start: 2024-02-13 — End: 2024-05-18
  Filled 2024-02-13: qty 180, 90d supply, fill #0

## 2024-02-13 NOTE — Telephone Encounter (Signed)
 This encounter was created in error - please disregard.

## 2024-02-13 NOTE — Telephone Encounter (Signed)
 There's no message from the call center. I called pt - no answer , left message to call the office if assistance is needed.

## 2024-02-13 NOTE — Addendum Note (Signed)
 Addended by: TONETTE OLAM ORN on: 02/13/2024 03:55 PM   Modules accepted: Orders, Level of Service

## 2024-02-13 NOTE — Telephone Encounter (Signed)
 Previous blank message routed in error to clinic

## 2024-02-13 NOTE — Telephone Encounter (Unsigned)
 Copied from CRM [id number]. Topic: Clinical - Medication Refill >> Feb 13, 2024  3:31 PM Eddie [name] wrote: Source [place], [place] [name] (Patient)   Subject Eddie [name] (Patient)   Topic Clinical - Medication Refill   Communication Medication:  metFORMIN  (GLUCOPHAGE ) 500 MG tablet  carvedilol  (COREG ) 25 MG tablet  atorvastatin  (LIPITOR ) 80 MG tablet  glucose blood test strip    Has the patient contacted their pharmacy? She attempted to have the prescriptions filled via [facility] however there has been  no progress with filling that way.    This is the patient's preferred pharmacy:  [facility]  [address], [address]  [place] [address]  Phone: [phone] Fax: [phone]    Is this the correct pharmacy for this prescription? Yes  If no, delete pharmacy and type the correct one.    Has the prescription been filled recently? Yes    Is the patient out of the medication? Yes    Has the patient been seen for an appointment in the last year OR does the patient have an upcoming appointment? Yes    Can we respond through MyChart? No    Agent: Please be advised that Rx refills may take up to 3 business days. We ask that you follow-up with your pharmacy.

## 2024-02-14 ENCOUNTER — Encounter: Payer: Self-pay | Admitting: Nephrology

## 2024-02-14 ENCOUNTER — Other Ambulatory Visit: Payer: Self-pay | Admitting: Nephrology

## 2024-02-14 ENCOUNTER — Other Ambulatory Visit: Payer: Self-pay

## 2024-02-14 DIAGNOSIS — N1832 Chronic kidney disease, stage 3b: Secondary | ICD-10-CM

## 2024-02-21 ENCOUNTER — Ambulatory Visit (HOSPITAL_COMMUNITY)
Admission: RE | Admit: 2024-02-21 | Discharge: 2024-02-21 | Disposition: A | Discharge status: Home / Self Care | Source: Ambulatory Visit | Attending: Physician Assistant | Admitting: Physician Assistant

## 2024-02-21 DIAGNOSIS — I5032 Chronic diastolic (congestive) heart failure: Secondary | ICD-10-CM | POA: Insufficient documentation

## 2024-02-21 DIAGNOSIS — I110 Hypertensive heart disease with heart failure: Secondary | ICD-10-CM | POA: Diagnosis not present

## 2024-02-21 LAB — ECHOCARDIOGRAM COMPLETE
Area-P 1/2: 5.31 cm2
Calc EF: 43.3 %
S' Lateral: 3.6 cm
Single Plane A2C EF: 36.1 %
Single Plane A4C EF: 49.6 %

## 2024-02-26 ENCOUNTER — Ambulatory Visit: Admitting: Podiatry

## 2024-03-03 ENCOUNTER — Other Ambulatory Visit (HOSPITAL_COMMUNITY): Payer: Self-pay

## 2024-03-03 ENCOUNTER — Ambulatory Visit (INDEPENDENT_AMBULATORY_CARE_PROVIDER_SITE_OTHER): Admitting: Podiatry

## 2024-03-03 VITALS — Ht 66.0 in | Wt 127.2 lb

## 2024-03-03 DIAGNOSIS — L97422 Non-pressure chronic ulcer of left heel and midfoot with fat layer exposed: Secondary | ICD-10-CM | POA: Diagnosis not present

## 2024-03-03 DIAGNOSIS — L03031 Cellulitis of right toe: Secondary | ICD-10-CM

## 2024-03-03 MED ORDER — CEPHALEXIN 500 MG PO CAPS
500.0000 mg | ORAL_CAPSULE | Freq: Three times a day (TID) | ORAL | 0 refills | Status: DC
Start: 2024-03-03 — End: 2024-03-03
  Filled 2024-03-03: qty 42, 14d supply, fill #0

## 2024-03-03 MED ORDER — CEPHALEXIN 250 MG PO CAPS
250.0000 mg | ORAL_CAPSULE | Freq: Three times a day (TID) | ORAL | 0 refills | Status: AC
  Filled 2024-03-03: qty 42, 14d supply, fill #0

## 2024-03-03 NOTE — Progress Notes (Signed)
 Subjective:  Patient ID: Eddie [name], male    DOB: Oct 23, 1950,  MRN: [id number]  Chief Complaint  Patient presents with   Wound Check    RM  22 Patient is here for ulcer on the left foot. Patient is also concerned about swelling in the right hallux and index toe  Patient states right hallux and index toe swelling has been present for the last week. Right hallux and index toe is bleeding (possible ingrown toe nail of the right hallux). Left foot ulcer is scabbed over with no drainage, redness or swelling.    73 y.o. male presents with the above complaint. History confirmed with patient.  Has a new issue again with the right great toe.  Objective:  Physical Exam: Left foot pulses are nonpalpable there is mild edema, full-thickness ulcer on the lateral heel measuring 0.4 x 0.5 x 0.2 cm.  Exposed subcutaneous tissue tender to palpation. Surrounding hyperkeratosis no active drainage or signs of infection.  Right hallux has ingrown lateral border with purulent drainage and bleeding    Ra    LOWER EXTREMITY DOPPLER STUDY  Patient Name:  Eddie [name]  Date of Exam:   12/13/2023 Medical Rec #: [id number]      Accession #:    [id number] Date of Birth: 1951/05/02      Patient Gender: M Patient Age:   67 years Exam Location:  [address] Procedure:      VAS US  ABI WITH/WO TBI Referring Phys: JOSHUA ROBINS   --------------------------------------------------------------------------- -----   Indications: Peripheral artery disease.  High Risk Factors: Hypertension, coronary artery disease, prior CVA.    Comparison Study: 06/07/23  Performing Technologist: Duwaine Hives RVS    Examination Guidelines: A complete evaluation includes at minimum, Doppler waveform signals and systolic blood pressure reading at the level of bilateral brachial, anterior tibial, and posterior tibial arteries, when vessel segments are accessible. Bilateral testing is considered an integral part of  a complete examination. Photoelectric Plethysmograph (PPG) waveforms and toe systolic pressure readings are included as required and additional duplex testing as needed. Limited examinations for reoccurring indications may be performed as noted.    ABI Findings: +---------+------------------+-----+----------+--------+ Right    Rt Pressure (mmHg)IndexWaveform  Comment  +---------+------------------+-----+----------+--------+ Brachial 151                                       +---------+------------------+-----+----------+--------+ PTA      194               1.28 monophasic         +---------+------------------+-----+----------+--------+ DP       254               1.68 monophasic         +---------+------------------+-----+----------+--------+ Great Toe75                0.50                    +---------+------------------+-----+----------+--------+  +---------+------------------+-----+----------+-------+ Left     Lt Pressure (mmHg)IndexWaveform  Comment +---------+------------------+-----+----------+-------+ Brachial 140                                      +---------+------------------+-----+----------+-------+ PTA      254  1.68 monophasic        +---------+------------------+-----+----------+-------+ DP       149               0.99 monophasic        +---------+------------------+-----+----------+-------+ Great Toe54                0.36                   +---------+------------------+-----+----------+-------+  +-------+-----------+-----------+------------+------------+ ABI/TBIToday's ABIToday's TBIPrevious ABIPrevious TBI +-------+-----------+-----------+------------+------------+ Right  [place]         0.50       [place]          0.0          +-------+-----------+-----------+------------+------------+ Left   [place]         0.36       [place]          0.20          +-------+-----------+-----------+------------+------------+      Bilateral ABIs appear essentially unchanged compared to prior study on 06/07/23.   Summary: Right: Resting right ankle-brachial index indicates noncompressible right lower extremity arteries. The right toe-brachial index is abnormal.  Left: Resting left ankle-brachial index indicates noncompressible left lower extremity arteries. The left toe-brachial index is abnormal.   Assessment:   1. 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 Paronychia of great toe of right foot      Plan:  Patient was evaluated and treated and all questions answered.  Left heel ulcer was debrided sharply to the subcutaneous layer in an excisional manner today to remove hyperkeratosis nonviable tissue and eschar and slough.  Postdebridement measurements are noted above.  It was dressed with antibiotic ointment and bandage.  Continues to be stable and slowly healing    Ingrown Nail, right -Patient elects to proceed with minor surgery to remove ingrown toenail today. Consent reviewed and signed by patient. -Ingrown nail excised. See procedure note.  Had moderate abscess and depth.  At risk of infection if not healing -Educated on post-procedure care including soaking. Written instructions provided and reviewed. -Rx for Keflex  renal dosed sent to pharmacy  Procedure: Excision of Ingrown Toenail Location: Right 1st toe lateral nail borders. Anesthesia: Lidocaine  1% plain; 1.5 mL and Marcaine 0.5% plain; 1.5 mL, digital block. Skin Prep: Betadine. Dressing: Silvadene; telfa; dry, sterile, compression dressing. Technique: Following skin prep, the toe was exsanguinated and a tourniquet was secured at the base of the toe. The affected nail border was freed, split with a nail splitter, and excised, no matricectomy was performed, it was debrided of nonviable tissue and abscess and irrigated out with alcohol. The tourniquet was  then removed and sterile dressing applied. Disposition: Patient tolerated procedure well.    Return in about 2 weeks (around 03/17/2024) for wound care, nail re-check.

## 2024-03-04 ENCOUNTER — Other Ambulatory Visit (HOSPITAL_COMMUNITY): Payer: Self-pay

## 2024-03-17 ENCOUNTER — Ambulatory Visit (INDEPENDENT_AMBULATORY_CARE_PROVIDER_SITE_OTHER): Admitting: Podiatry

## 2024-03-17 DIAGNOSIS — L97422 Non-pressure chronic ulcer of left heel and midfoot with fat layer exposed: Secondary | ICD-10-CM

## 2024-03-17 NOTE — Progress Notes (Signed)
 Subjective:  Patient ID: Eddie [name], male    DOB: 1951-05-16,  MRN: [id number]  Chief Complaint  Patient presents with   Wound Check    Return in about 2 weeks (around 03/17/2024) for wound care, nail re-check.    73 y.o. male presents with the above complaint. History confirmed with patient.  The right great toe is feeling somewhat better still painful enough that he can go back to a regular shoe as well as the left heel  Objective:  Physical Exam: Left foot pulses are nonpalpable there is mild edema, full-thickness ulcer on the lateral heel measuring 0.5 x 0.6 x 0.3 cm.  Exposed subcutaneous tissue with fibrosis tender to palpation.Surrounding hyperkeratosis no active drainage or signs of infection.  Right hallux ingrown removal site stable without signs of infection       Ra    LOWER EXTREMITY DOPPLER STUDY  Patient Name:  Eddie [name]  Date of Exam:   12/13/2023 Medical Rec #: [id number]      Accession #:    [id number] Date of Birth: 06-21-1951      Patient Gender: M Patient Age:   80 years Exam Location:  [address] Procedure:      VAS US  ABI WITH/WO TBI Referring Phys: JOSHUA ROBINS   --------------------------------------------------------------------------- -----   Indications: Peripheral artery disease.  High Risk Factors: Hypertension, coronary artery disease, prior CVA.    Comparison Study: 06/07/23  Performing Technologist: Duwaine Hives RVS    Examination Guidelines: A complete evaluation includes at minimum, Doppler waveform signals and systolic blood pressure reading at the level of bilateral brachial, anterior tibial, and posterior tibial arteries, when vessel segments are accessible. Bilateral testing is considered an integral part of a complete examination. Photoelectric Plethysmograph (PPG) waveforms and toe systolic pressure readings are included as required and additional duplex testing as needed. Limited examinations for  reoccurring indications may be performed as noted.    ABI Findings: +---------+------------------+-----+----------+--------+ Right    Rt Pressure (mmHg)IndexWaveform  Comment  +---------+------------------+-----+----------+--------+ Brachial 151                                       +---------+------------------+-----+----------+--------+ PTA      194               1.28 monophasic         +---------+------------------+-----+----------+--------+ DP       254               1.68 monophasic         +---------+------------------+-----+----------+--------+ Great Toe75                0.50                    +---------+------------------+-----+----------+--------+  +---------+------------------+-----+----------+-------+ Left     Lt Pressure (mmHg)IndexWaveform  Comment +---------+------------------+-----+----------+-------+ Brachial 140                                      +---------+------------------+-----+----------+-------+ PTA      254               1.68 monophasic        +---------+------------------+-----+----------+-------+ DP       149               0.99 monophasic        +---------+------------------+-----+----------+-------+  Great Toe54                0.36                   +---------+------------------+-----+----------+-------+  +-------+-----------+-----------+------------+------------+ ABI/TBIToday's ABIToday's TBIPrevious ABIPrevious TBI +-------+-----------+-----------+------------+------------+ Right  [place]         0.50       [place]          0.0          +-------+-----------+-----------+------------+------------+ Left   [place]         0.36       [place]          0.20         +-------+-----------+-----------+------------+------------+      Bilateral ABIs appear essentially unchanged compared to prior study on 06/07/23.   Summary: Right: Resting right ankle-brachial index indicates noncompressible  right lower extremity arteries. The right toe-brachial index is abnormal.  Left: Resting left ankle-brachial index indicates noncompressible left lower extremity arteries. The left toe-brachial index is abnormal.   Assessment:   1. 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  PAD, ischemic ulcer of left heel Is ingrown nail removal site is free of infection appears to be healing although slowly.  His left heel ulcer has had minimal change and improvement in the size and dimensions despite local wound care and debridement.  Will discuss with vascular surgery regarding possibility of angiography to evaluate his peripheral circulation further.  Follow-up with me in 3 weeks for local wound care.   Return in about 3 weeks (around 04/07/2024) for wound care.

## 2024-03-28 ENCOUNTER — Other Ambulatory Visit: Payer: Self-pay | Admitting: *Deleted

## 2024-03-28 DIAGNOSIS — I70211 Atherosclerosis of native arteries of extremities with intermittent claudication, right leg: Secondary | ICD-10-CM

## 2024-03-28 DIAGNOSIS — I739 Peripheral vascular disease, unspecified: Secondary | ICD-10-CM

## 2024-04-03 NOTE — H&P (View-Only) (Signed)
 Patient ID: Eddie [name], male   DOB: 1951/05/23, 73 y.o.   MRN: [id number]  Reason for Consult: Follow-up   Referred by [name], DO  Subjective:     HPI Eddie [name] is a 73 y.o. male presenting for follow-up of PAD.  He was last seen in May 2025 at that time his claudication is improving and he did not have rest pain or nonhealing wounds.  I was recently contacted by his podiatrist, Dr. Silva and he now has a small lateral left heel wound. He reports the left lateral heel wound was larger about a month ago but has contracted somewhat.  He is reporting significant rest pain at night keeping him from sleeping and has been sleeping in a chair.  He also has some foot swelling.  He continues to smoke but a pack lasts about a month  Past Medical History:  Diagnosis Date   Blindness of left eye    CAD (coronary artery disease)    a. STEMI 09/2015 w/ DES to Prox LAD   CVA (cerebral infarction)    a. 09/2015: acute small right frontal lobe infarct   Diabetes (HCC)    Hypertension    Ischemic cardiomyopathy    OSA (obstructive sleep apnea) 07/06/2017   Mild OSA with AHI of 8.2/hr with hypoxemia as low as 76% now on CPAP at 11cm H2O.   Peripheral vascular disease (HCC)    PUD (peptic ulcer disease)    ST elevation myocardial infarction involving left anterior descending (LAD) coronary artery (HCC) 09/28/2015   Family History  Problem Relation Age of Onset   Diabetes Mother    Cancer Neg Hx    Past Surgical History:  Procedure Laterality Date   CARDIAC CATHETERIZATION N/A 09/28/2015   Procedure: Left Heart Cath and Coronary Angiography;  Surgeon: Dorn JINNY Lesches, MD;  Location: [facility] INVASIVE CV LAB;  Service: Cardiovascular;  Laterality: N/A;   CARDIAC CATHETERIZATION N/A 10/08/2015   Procedure: Left Heart Cath and Coronary Angiography;  Surgeon: Victory LELON Sharps, MD;  Location: [facility] INVASIVE CV LAB;  Service: Cardiovascular;  Laterality: N/A;   CATARACT EXTRACTION Right     Repair of Peptic Ulcer      Short Social History:  Social History   Tobacco Use   Smoking status: Every Day    Current packs/day: 0.30    Types: Cigarettes   Smokeless tobacco: Never  Substance Use Topics   Alcohol use: No    Alcohol/week: 0.0 standard drinks of alcohol    Allergies  Allergen Reactions   Bidil [Isosorb Dinitrate-Hydralazine ] Other (See Comments)    Patient became lightheaded with 1 tablet tid.     Current Outpatient Medications  Medication Sig Dispense Refill   Accu-Chek FastClix Lancets MISC Use to check blood sugar 1 time a day 102 each 3   Acetaminophen  325 MG CAPS Take 2 tablets by mouth every 4 (four) hours as needed.     albuterol  (VENTOLIN  HFA) 108 (90 Base) MCG/ACT inhaler Inhale 2 puffs into the lungs every 6 (six) hours as needed for wheezing or shortness of breath. 8.5 g 2   aspirin  EC 81 MG tablet Take 1 tablet (81 mg total) by mouth daily. 90 tablet 3   atorvastatin  (LIPITOR ) 80 MG tablet Take 1 tablet (80 mg total) by mouth daily. 90 tablet 0   carvedilol  (COREG ) 25 MG tablet Take 1 tablet (25 mg total) by mouth 2 (two) times daily. 180 tablet 0   clopidogrel  (  PLAVIX ) 75 MG tablet Take 1 tablet (75 mg total) by mouth daily. 90 tablet 0   dorzolamide -timolol  (COSOPT ) 2-0.5 % ophthalmic solution Place 1 drop into both eyes 2 (two) times daily. 15 mL 12   empagliflozin  (JARDIANCE ) 10 MG TABS tablet Take 1 tablet (10 mg total) by mouth daily before breakfast. 90 tablet 3   eplerenone  (INSPRA ) 25 MG tablet Take 1/2  tablet (12.5 mg total) by mouth daily. 45 tablet 3   ezetimibe  (ZETIA ) 10 MG tablet Take 1 tablet (10 mg total) by mouth daily. 90 tablet 3   furosemide  (LASIX ) 40 MG tablet Take 1 tablet (40 mg total) by mouth as needed (for weight gain of 3 lbs in a day or 5 lbs in a week). 30 tablet 3   gentamicin  cream (GARAMYCIN ) 0.1 % Apply to affected area once daily. 30 g 1   glucose blood test strip USE TO CHECK BLOOD SUGAR ONCE DAILY 100 strip 12    Insulin  Pen Needle (UNIFINE PENTIPS) 31G X 5 MM MISC Use daily as directed 100 each 5   Lancets Misc. (ACCU-CHEK FASTCLIX LANCET) KIT Use as directed once daily 1 kit 1   latanoprost  (XALATAN ) 0.005 % ophthalmic solution Place 1 drop into both eyes every evening. 7.5 mL 12   lidocaine  (XYLOCAINE ) 5 % ointment Apply 1 Application topically as needed. 35.44 g 3   metFORMIN  (GLUCOPHAGE ) 500 MG tablet Take 1 tablet (500 mg total) by mouth 2 (two) times daily with a meal. 180 tablet 0   nitroGLYCERIN  (NITROSTAT ) 0.4 MG SL tablet Use one pill every 5 minutes X 3 if needed for chest pain. Contact MD if pain does not improve 30 tablet 0   sacubitril -valsartan  (ENTRESTO ) 49-51 MG Take 1 tablet by mouth 2 (two) times daily. 60 tablet 11   vitamin B-12 (CYANOCOBALAMIN ) 1000 MCG tablet Take 1 tablet (1,000 mcg total) by mouth daily. 90 tablet 1   No current facility-administered medications for this visit.    REVIEW OF SYSTEMS  All other systems were reviewed and are negative     Objective:  Objective   Vitals:   04/04/24 1344  BP: (!) 162/81  Pulse: 69  Temp: 97.8 F (36.6 C)  SpO2: 99%  Weight: 127 lb (57.6 kg)  Height: 5' 6 (1.676 m)   Body mass index is 20.5 kg/m.  Physical Exam General: no acute distress Extremities: Small 5 mm circular triangle ulcer on the left lateral heel, 1+ edema bilateral feet and ankles Vascular:   Right: Palpable femoral, nonpalpable pedal's  Left: Palpable femoral, nonpalpable pedal  Data: ABI ABI Findings:  +---------+------------------+-----+----------+--------+  Right   Rt Pressure (mmHg)IndexWaveform  Comment   +---------+------------------+-----+----------+--------+  Brachial 167                                        +---------+------------------+-----+----------+--------+  PTA     254               1.52 monophasicCNO       +---------+------------------+-----+----------+--------+  DP      254               1.52  monophasicCNO       +---------+------------------+-----+----------+--------+  Great Toe59                0.35 Abnormal            +---------+------------------+-----+----------+--------+   +---------+------------------+-----+----------+-------+  Left    Lt Pressure (mmHg)IndexWaveform  Comment  +---------+------------------+-----+----------+-------+  Brachial 156                                       +---------+------------------+-----+----------+-------+  PTA     254               1.52 monophasicCNO      +---------+------------------+-----+----------+-------+  DP      200               1.20 monophasic         +---------+------------------+-----+----------+-------+  Great Toe60                0.36 Abnormal           +---------+------------------+-----+----------+-------+   +-------+-----------+-----------+------------+------------+  ABI/TBIToday's ABIToday's TBIPrevious ABIPrevious TBI  +-------+-----------+-----------+------------+------------+  Right [place]         0.35       [place]          0.50          +-------+-----------+-----------+------------+------------+  Left  [place]         0.36       [place]          0.36          +-------+-----------+-----------+------------+------------+   Duplex +-----------+-------+-----+--------------+----------+----------------------  ----+  LEFT      PSV    RatioStenosis      Waveform  Comments                                cm/s                                                             +-----------+-------+-----+--------------+----------+----------------------  ----+  CFA Distal 151                       triphasic                              +-----------+-------+-----+--------------+----------+----------------------  ----+  DFA       131                       triphasic                               +-----------+-------+-----+--------------+----------+----------------------  ----+  SFA Prox   88                        triphasic                              +-----------+-------+-----+--------------+----------+----------------------  ----+  SFA Mid    67                        monophasic                             +-----------+-------+-----+--------------+----------+----------------------  ----+  SFA Distal 117    10   75-99%        monophasicStenosis based on PSV                               stenosis                ratio                        +-----------+-------+-----+--------------+----------+----------------------  ----+  POP Prox   73                        monophasic                             +-----------+-------+-----+--------------+----------+----------------------  ----+  POP Distal 67                        monophasic                             +-----------+-------+-----+--------------+----------+----------------------  ----+  ATA Distal 58                        monophasic                             +-----------+-------+-----+--------------+----------+----------------------  ----+  PTA Distal 55                        monophasic                             +-----------+-------+-----+--------------+----------+----------------------  ----+  PERO Distal23                        monophasic                             +-----------+-------+-----+--------------+----------+----------------------  ----+        Summary:  Left: 75-99% stenosis noted in the superficial femoral artery.   BMP reviewed, creatinine 2.9     Assessment/Plan:   Eddie [name] is a 73 y.o. male with CL TI with a left heel wound.  We discussed the natural history and grading of PAD and I explained that given the fact that he now has a wound he is at high risk of future amputation and therefore recommended angiogram in  order to better evaluate his perfusion as well as possibly revascularize his left leg.  Risks and benefits of left leg angiogram were reviewed and he elected to proceed.   Will plan for right groin access and CO2 and judicious contrast usage given his renal function Encouraged smoking cessation.  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 The patient is on best medical therapy for peripheral arterial disease. The patient has been counseled about the risks of tobacco use in atherosclerotic disease. The patient has been counseled  to abstain from any tobacco use. An aortogram with bilateral lower extremity runoff angiography and Left lower extremity intervention and is indicated to better evaluate the patient's lower extremity circulation because of the limb threatening nature of the patient's diagnosis. Based on the patient's clinical exam and non-invasive data, we anticipate an endovascular intervention in the femoropopliteal and tibial vessels. Stenting and/or athrectomy would be favored because of the improved primary patency of these interventions as compared to plain balloon angioplasty.   Recommendations to optimize cardiovascular risk: Abstinence from all tobacco products. Blood glucose control with goal A1c < 7%. Blood pressure control with goal blood pressure < 140/90 mmHg. Lipid reduction therapy with goal LDL-C <100 mg/dL  Aspirin  81mg  PO QD.  Atorvastatin  40-80mg  PO QD (or other high intensity statin therapy).   Norman GORMAN Serve MD [facility]

## 2024-04-03 NOTE — Progress Notes (Unsigned)
 Patient ID: Eddie [name], male   DOB: [birth year], 73 y.o.   MRN: [id number]  Reason for Consult: No chief complaint on file.   Referred by Eddie Dayton BROCKS, DO  Subjective:     HPI Eddie [name] is a 73 y.o. male presenting for follow-up of PAD.  He was last seen in May 2025 at that time his claudication is improving and he did not have rest pain or nonhealing wounds.  I was recently contacted by his podiatrist, Dr. Silva and he now has a small lateral left heel wound. ***  Past Medical History:  Diagnosis Date   Blindness of left eye    CAD (coronary artery disease)    a. STEMI 09/2015 w/ DES to Prox LAD   CVA (cerebral infarction)    a. 09/2015: acute small right frontal lobe infarct   Diabetes (HCC)    Hypertension    Ischemic cardiomyopathy    OSA (obstructive sleep apnea) 07/06/2017   Mild OSA with AHI of 8.2/hr with hypoxemia as low as 76% now on CPAP at 11cm H2O.   Peripheral vascular disease (HCC)    PUD (peptic ulcer disease)    ST elevation myocardial infarction involving left anterior descending (LAD) coronary artery (HCC) 09/28/2015   Family History  Problem Relation Age of Onset   Diabetes Mother    Cancer Neg Hx    Past Surgical History:  Procedure Laterality Date   CARDIAC CATHETERIZATION N/A 09/28/2015   Procedure: Left Heart Cath and Coronary Angiography;  Surgeon: Eddie JINNY Lesches, MD;  Location: [facility] INVASIVE CV LAB;  Service: Cardiovascular;  Laterality: N/A;   CARDIAC CATHETERIZATION N/A 10/08/2015   Procedure: Left Heart Cath and Coronary Angiography;  Surgeon: Eddie LELON Sharps, MD;  Location: [facility] INVASIVE CV LAB;  Service: Cardiovascular;  Laterality: N/A;   CATARACT EXTRACTION Right    Repair of Peptic Ulcer      Short Social History:  Social History   Tobacco Use   Smoking status: Every Day    Current packs/day: 0.30    Types: Cigarettes   Smokeless tobacco: Never  Substance Use Topics   Alcohol use: No    Alcohol/week: 0.0 standard drinks of  alcohol    Allergies  Allergen Reactions   Bidil [Isosorb Dinitrate-Hydralazine ] Other (See Comments)    Patient became lightheaded with 1 tablet tid.     Current Outpatient Medications  Medication Sig Dispense Refill   Accu-Chek FastClix Lancets MISC Use to check blood sugar 1 time a day 102 each 3   Acetaminophen  325 MG CAPS Take 2 tablets by mouth every 4 (four) hours as needed.     albuterol  (VENTOLIN  HFA) 108 (90 Base) MCG/ACT inhaler Inhale 2 puffs into the lungs every 6 (six) hours as needed for wheezing or shortness of breath. 8.5 g 2   aspirin  EC 81 MG tablet Take 1 tablet (81 mg total) by mouth daily. 90 tablet 3   atorvastatin  (LIPITOR ) 80 MG tablet Take 1 tablet (80 mg total) by mouth daily. 90 tablet 0   carvedilol  (COREG ) 25 MG tablet Take 1 tablet (25 mg total) by mouth 2 (two) times daily. 180 tablet 0   clopidogrel  (PLAVIX ) 75 MG tablet Take 1 tablet (75 mg total) by mouth daily. 90 tablet 0   dorzolamide -timolol  (COSOPT ) 2-0.5 % ophthalmic solution Place 1 drop into both eyes 2 (two) times daily. 15 mL 12   empagliflozin  (JARDIANCE ) 10 MG TABS tablet Take 1 tablet (10 mg total)  by mouth daily before breakfast. 90 tablet 3   eplerenone  (INSPRA ) 25 MG tablet Take 1/2  tablet (12.5 mg total) by mouth daily. 45 tablet 3   ezetimibe  (ZETIA ) 10 MG tablet Take 1 tablet (10 mg total) by mouth daily. 90 tablet 3   furosemide  (LASIX ) 40 MG tablet Take 1 tablet (40 mg total) by mouth as needed (for weight gain of 3 lbs in a day or 5 lbs in a week). 30 tablet 3   gentamicin  cream (GARAMYCIN ) 0.1 % Apply to affected area once daily. 30 g 1   glucose blood test strip USE TO CHECK BLOOD SUGAR ONCE DAILY 100 strip 12   Insulin  Pen Needle (UNIFINE PENTIPS) 31G X 5 MM MISC Use daily as directed 100 each 5   Lancets Misc. (ACCU-CHEK FASTCLIX LANCET) KIT Use as directed once daily 1 kit 1   latanoprost  (XALATAN ) 0.005 % ophthalmic solution Place 1 drop into both eyes every evening. 7.5 mL 12    lidocaine  (XYLOCAINE ) 5 % ointment Apply 1 Application topically as needed. 35.44 g 3   metFORMIN  (GLUCOPHAGE ) 500 MG tablet Take 1 tablet (500 mg total) by mouth 2 (two) times daily with a meal. 180 tablet 0   nitroGLYCERIN  (NITROSTAT ) 0.4 MG SL tablet Use one pill every 5 minutes X 3 if needed for chest pain. Contact MD if pain does not improve 30 tablet 0   sacubitril -valsartan  (ENTRESTO ) 49-51 MG Take 1 tablet by mouth 2 (two) times daily. 60 tablet 11   vitamin B-12 (CYANOCOBALAMIN ) 1000 MCG tablet Take 1 tablet (1,000 mcg total) by mouth daily. 90 tablet 1   No current facility-administered medications for this visit.    REVIEW OF SYSTEMS  All other systems were reviewed and are negative     Objective:  Objective   There were no vitals filed for this visit. There is no height or weight on file to calculate BMI.  Physical Exam General: no acute distress Extremities: *** Vascular:   Right: Palpable femoral, nonpalpable pedal's  Left: Palpable femoral, nonpalpable pedal  Data: ABI ***  Duplex ***  BMP reviewed, creatinine 2.9     Assessment/Plan:   [name] is a 73 y.o. male with CL TI with a left heel wound.  We discussed the natural history and grading of PAD and I explained that given the fact that he now has a wound he is at high risk of future amputation and therefore recommended angiogram in order to better evaluate his perfusion as well as possibly revascularize his left leg. Risks and benefits of left leg angiogram were reviewed and he elected to proceed.   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 The patient is on best medical therapy for peripheral arterial disease. The patient has been counseled about the risks of tobacco use in atherosclerotic disease. The patient has been counseled to abstain  from any tobacco use. An aortogram with bilateral lower extremity runoff angiography and Left lower extremity intervention and is indicated to better evaluate the patient's lower extremity circulation because of the limb threatening nature of the patient's diagnosis. Based on the patient's clinical exam and non-invasive data, we anticipate an endovascular intervention in the femoropopliteal and tibial vessels. Stenting and/or athrectomy would be favored because of the improved primary patency of these interventions as compared to plain balloon angioplasty.  Recommendations to optimize cardiovascular risk: Abstinence from all tobacco products. Blood glucose control with goal A1c < 7%. Blood pressure control with goal blood pressure < 140/90 mmHg. Lipid reduction therapy with goal LDL-C <100 mg/dL  Aspirin  81mg  PO QD.  Atorvastatin  40-80mg  PO QD (or other high intensity statin therapy).   Norman GORMAN Serve MD [facility]

## 2024-04-04 ENCOUNTER — Ambulatory Visit (HOSPITAL_COMMUNITY)
Admission: RE | Admit: 2024-04-04 | Discharge: 2024-04-04 | Discharge status: Home / Self Care | Source: Ambulatory Visit | Attending: Vascular Surgery

## 2024-04-04 ENCOUNTER — Other Ambulatory Visit: Payer: Self-pay

## 2024-04-04 ENCOUNTER — Ambulatory Visit (INDEPENDENT_AMBULATORY_CARE_PROVIDER_SITE_OTHER): Admitting: Vascular Surgery

## 2024-04-04 ENCOUNTER — Ambulatory Visit (HOSPITAL_COMMUNITY)
Admission: RE | Admit: 2024-04-04 | Discharge: 2024-04-04 | Disposition: A | Discharge status: Home / Self Care | Source: Ambulatory Visit | Attending: Vascular Surgery | Admitting: Vascular Surgery

## 2024-04-04 ENCOUNTER — Encounter: Payer: Self-pay | Admitting: Vascular Surgery

## 2024-04-04 VITALS — BP 162/81 | HR 69 | Temp 97.8°F | Ht 66.0 in | Wt 127.0 lb

## 2024-04-04 DIAGNOSIS — I70222 Atherosclerosis of native arteries of extremities with rest pain, left leg: Secondary | ICD-10-CM

## 2024-04-04 DIAGNOSIS — I70212 Atherosclerosis of native arteries of extremities with intermittent claudication, left leg: Secondary | ICD-10-CM | POA: Diagnosis not present

## 2024-04-04 DIAGNOSIS — I739 Peripheral vascular disease, unspecified: Secondary | ICD-10-CM

## 2024-04-04 DIAGNOSIS — I70211 Atherosclerosis of native arteries of extremities with intermittent claudication, right leg: Secondary | ICD-10-CM | POA: Diagnosis present

## 2024-04-05 ENCOUNTER — Other Ambulatory Visit (HOSPITAL_COMMUNITY): Payer: Self-pay

## 2024-04-05 LAB — VAS US ABI WITH/WO TBI

## 2024-04-07 ENCOUNTER — Other Ambulatory Visit: Payer: Self-pay

## 2024-04-08 ENCOUNTER — Ambulatory Visit (INDEPENDENT_AMBULATORY_CARE_PROVIDER_SITE_OTHER): Admitting: Podiatry

## 2024-04-08 VITALS — Ht 66.0 in | Wt 127.0 lb

## 2024-04-08 DIAGNOSIS — L97422 Non-pressure chronic ulcer of left heel and midfoot with fat layer exposed: Secondary | ICD-10-CM

## 2024-04-09 ENCOUNTER — Encounter: Payer: Self-pay | Admitting: Podiatry

## 2024-04-09 NOTE — Progress Notes (Signed)
 Subjective:  Patient ID: Eddie [name], male    DOB: 1951/03/30,  MRN: [id number]  Chief Complaint  Patient presents with   Wound Check    RM 1 Return in about 3 weeks (around 04/07/2024) for wound care, diabetic ulcer of the left heel.  Wound is scabbed over with no drainage. Outpatient surgery scheduled for 04/21/24.    73 y.o. male presents with the above complaint. History confirmed with patient.  Pain is improved some  Objective:  Physical Exam: Left foot pulses are nonpalpable there is mild edema, full-thickness ulcer on the lateral heel measuring 0.4 x 0.4 x 0.3 cm.  Exposed subcutaneous tissue with fibrosis tender to palpation.Surrounding hyperkeratosis no active drainage or signs of infection.  Right hallux ingrown removal site has healed fairly well       Ra    LOWER EXTREMITY DOPPLER STUDY  Patient Name:  Eddie [name]  Date of Exam:   12/13/2023 Medical Rec #: [id number]      Accession #:    [id number] Date of Birth: July 25, 1950      Patient Gender: M Patient Age:   52 years Exam Location:  [address] Procedure:      VAS US  ABI WITH/WO TBI Referring Phys: JOSHUA ROBINS   --------------------------------------------------------------------------- -----   Indications: Peripheral artery disease.  High Risk Factors: Hypertension, coronary artery disease, prior CVA.    Comparison Study: 06/07/23  Performing Technologist: Duwaine Hives RVS    Examination Guidelines: A complete evaluation includes at minimum, Doppler waveform signals and systolic blood pressure reading at the level of bilateral brachial, anterior tibial, and posterior tibial arteries, when vessel segments are accessible. Bilateral testing is considered an integral part of a complete examination. Photoelectric Plethysmograph (PPG) waveforms and toe systolic pressure readings are included as required and additional duplex testing as needed. Limited examinations for reoccurring indications  may be performed as noted.    ABI Findings: +---------+------------------+-----+----------+--------+ Right    Rt Pressure (mmHg)IndexWaveform  Comment  +---------+------------------+-----+----------+--------+ Brachial 151                                       +---------+------------------+-----+----------+--------+ PTA      194               1.28 monophasic         +---------+------------------+-----+----------+--------+ DP       254               1.68 monophasic         +---------+------------------+-----+----------+--------+ Great Toe75                0.50                    +---------+------------------+-----+----------+--------+  +---------+------------------+-----+----------+-------+ Left     Lt Pressure (mmHg)IndexWaveform  Comment +---------+------------------+-----+----------+-------+ Brachial 140                                      +---------+------------------+-----+----------+-------+ PTA      254               1.68 monophasic        +---------+------------------+-----+----------+-------+ DP       149               0.99 monophasic        +---------+------------------+-----+----------+-------+  Great Toe54                0.36                   +---------+------------------+-----+----------+-------+  +-------+-----------+-----------+------------+------------+ ABI/TBIToday's ABIToday's TBIPrevious ABIPrevious TBI +-------+-----------+-----------+------------+------------+ Right  [place]         0.50       [place]          0.0          +-------+-----------+-----------+------------+------------+ Left   [place]         0.36       [place]          0.20         +-------+-----------+-----------+------------+------------+      Bilateral ABIs appear essentially unchanged compared to prior study on 06/07/23.   Summary: Right: Resting right ankle-brachial index indicates noncompressible right lower extremity  arteries. The right toe-brachial index is abnormal.  Left: Resting left ankle-brachial index indicates noncompressible left lower extremity arteries. The left toe-brachial index is abnormal.   Assessment:   1. 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  PAD, ischemic ulcer of left heel Continue using gentamicin  ointment for now.  Has upcoming angiography on 04/21/2024.  I will see him 2 weeks after this for debridement and ongoing wound care hopefully should be able to heal following this.  Return in about 4 weeks (around 05/06/2024) for wound care.

## 2024-04-21 ENCOUNTER — Encounter (HOSPITAL_COMMUNITY)
Admission: RE | Disposition: A | Discharge status: Home / Self Care | Payer: Self-pay | Source: Home / Self Care | Attending: Vascular Surgery

## 2024-04-21 ENCOUNTER — Ambulatory Visit (HOSPITAL_COMMUNITY)
Admission: RE | Admit: 2024-04-21 | Discharge: 2024-04-21 | Disposition: A | Discharge status: Home / Self Care | Attending: Vascular Surgery | Admitting: Vascular Surgery

## 2024-04-21 ENCOUNTER — Other Ambulatory Visit: Payer: Self-pay

## 2024-04-21 DIAGNOSIS — F17210 Nicotine dependence, cigarettes, uncomplicated: Secondary | ICD-10-CM | POA: Diagnosis not present

## 2024-04-21 DIAGNOSIS — I70222 Atherosclerosis of native arteries of extremities with rest pain, left leg: Secondary | ICD-10-CM

## 2024-04-21 DIAGNOSIS — E1122 Type 2 diabetes mellitus with diabetic chronic kidney disease: Secondary | ICD-10-CM | POA: Insufficient documentation

## 2024-04-21 DIAGNOSIS — E11621 Type 2 diabetes mellitus with foot ulcer: Secondary | ICD-10-CM | POA: Insufficient documentation

## 2024-04-21 DIAGNOSIS — E1151 Type 2 diabetes mellitus with diabetic peripheral angiopathy without gangrene: Secondary | ICD-10-CM | POA: Diagnosis not present

## 2024-04-21 DIAGNOSIS — Z794 Long term (current) use of insulin: Secondary | ICD-10-CM | POA: Insufficient documentation

## 2024-04-21 DIAGNOSIS — I129 Hypertensive chronic kidney disease with stage 1 through stage 4 chronic kidney disease, or unspecified chronic kidney disease: Secondary | ICD-10-CM | POA: Insufficient documentation

## 2024-04-21 DIAGNOSIS — N189 Chronic kidney disease, unspecified: Secondary | ICD-10-CM

## 2024-04-21 DIAGNOSIS — Z7984 Long term (current) use of oral hypoglycemic drugs: Secondary | ICD-10-CM | POA: Insufficient documentation

## 2024-04-21 DIAGNOSIS — L97429 Non-pressure chronic ulcer of left heel and midfoot with unspecified severity: Secondary | ICD-10-CM

## 2024-04-21 DIAGNOSIS — I7092 Chronic total occlusion of artery of the extremities: Secondary | ICD-10-CM

## 2024-04-21 DIAGNOSIS — Z7982 Long term (current) use of aspirin: Secondary | ICD-10-CM | POA: Diagnosis not present

## 2024-04-21 DIAGNOSIS — Z79899 Other long term (current) drug therapy: Secondary | ICD-10-CM | POA: Diagnosis not present

## 2024-04-21 DIAGNOSIS — I70244 Atherosclerosis of native arteries of left leg with ulceration of heel and midfoot: Secondary | ICD-10-CM | POA: Diagnosis not present

## 2024-04-21 LAB — POCT I-STAT, CHEM 8
BUN: 15 mg/dL (ref 8–23)
Calcium, Ion: 1.08 mmol/L — ABNORMAL LOW (ref 1.15–1.40)
Chloride: 109 mmol/L (ref 98–111)
Creatinine, Ser: 1.8 mg/dL — ABNORMAL HIGH (ref 0.61–1.24)
Glucose, Bld: 123 mg/dL — ABNORMAL HIGH (ref 70–99)
HCT: 27 % — ABNORMAL LOW (ref 39.0–52.0)
Hemoglobin: 9.2 g/dL — ABNORMAL LOW (ref 13.0–17.0)
Potassium: 4 mmol/L (ref 3.5–5.1)
Sodium: 143 mmol/L (ref 135–145)
TCO2: 25 mmol/L (ref 22–32)

## 2024-04-21 SURGERY — ABDOMINAL AORTOGRAM
Anesthesia: LOCAL

## 2024-04-21 MED ORDER — LIDOCAINE HCL (PF) 1 % IJ SOLN
INTRAMUSCULAR | Status: AC
  Filled 2024-04-21: qty 30

## 2024-04-21 MED ORDER — FENTANYL CITRATE (PF) 100 MCG/2ML IJ SOLN
INTRAMUSCULAR | Status: DC | PRN
  Administered 2024-04-21: 50 ug via INTRAVENOUS

## 2024-04-21 MED ORDER — LABETALOL HCL 5 MG/ML IV SOLN: 10.0000 mg | INTRAVENOUS | Status: DC | PRN

## 2024-04-21 MED ORDER — SODIUM CHLORIDE 0.9 % IV SOLN: INTRAVENOUS | Status: DC

## 2024-04-21 MED ORDER — CLOPIDOGREL BISULFATE 300 MG PO TABS
ORAL_TABLET | ORAL | Status: DC | PRN
  Administered 2024-04-21: 75 mg via ORAL

## 2024-04-21 MED ORDER — LIDOCAINE HCL (PF) 1 % IJ SOLN
INTRAMUSCULAR | Status: DC | PRN
  Administered 2024-04-21: 3 mL
  Administered 2024-04-21: 15 mL

## 2024-04-21 MED ORDER — MIDAZOLAM HCL 2 MG/2ML IJ SOLN
INTRAMUSCULAR | Status: AC
  Filled 2024-04-21: qty 2

## 2024-04-21 MED ORDER — SODIUM CHLORIDE 0.9 % WEIGHT BASED INFUSION: 1.0000 mL/kg/h | INTRAVENOUS | Status: DC

## 2024-04-21 MED ORDER — ACETAMINOPHEN 325 MG PO TABS: 650.0000 mg | ORAL_TABLET | ORAL | Status: DC | PRN

## 2024-04-21 MED ORDER — HYDRALAZINE HCL 20 MG/ML IJ SOLN: 5.0000 mg | INTRAMUSCULAR | Status: DC | PRN

## 2024-04-21 MED ORDER — ONDANSETRON HCL 4 MG/2ML IJ SOLN: 4.0000 mg | Freq: Four times a day (QID) | INTRAMUSCULAR | Status: DC | PRN

## 2024-04-21 MED ORDER — HEPARIN (PORCINE) IN NACL 1000-0.9 UT/500ML-% IV SOLN
INTRAVENOUS | Status: DC | PRN
  Administered 2024-04-21 (×2): 500 mL

## 2024-04-21 MED ORDER — HEPARIN SODIUM (PORCINE) 1000 UNIT/ML IJ SOLN
INTRAMUSCULAR | Status: AC
  Filled 2024-04-21: qty 10

## 2024-04-21 MED ORDER — SODIUM CHLORIDE 0.9% FLUSH: 3.0000 mL | INTRAVENOUS | Status: DC | PRN

## 2024-04-21 MED ORDER — SODIUM CHLORIDE 0.9% FLUSH: 3.0000 mL | Freq: Two times a day (BID) | INTRAVENOUS | Status: DC

## 2024-04-21 MED ORDER — ASPIRIN 81 MG PO CHEW
CHEWABLE_TABLET | ORAL | Status: AC
  Filled 2024-04-21: qty 1

## 2024-04-21 MED ORDER — OXYCODONE HCL 5 MG PO TABS: 5.0000 mg | ORAL_TABLET | ORAL | Status: DC | PRN

## 2024-04-21 MED ORDER — FENTANYL CITRATE (PF) 100 MCG/2ML IJ SOLN
INTRAMUSCULAR | Status: AC
  Filled 2024-04-21: qty 2

## 2024-04-21 MED ORDER — MIDAZOLAM HCL 2 MG/2ML IJ SOLN
INTRAMUSCULAR | Status: DC | PRN
  Administered 2024-04-21: 1 mg via INTRAVENOUS

## 2024-04-21 MED ORDER — ASPIRIN 81 MG PO CHEW
CHEWABLE_TABLET | ORAL | Status: DC | PRN
  Administered 2024-04-21: 81 mg via ORAL

## 2024-04-21 MED ORDER — SODIUM CHLORIDE 0.9 % IV SOLN: 250.0000 mL | INTRAVENOUS | Status: DC | PRN

## 2024-04-21 MED ORDER — IODIXANOL 320 MG/ML IV SOLN
INTRAVENOUS | Status: DC | PRN
  Administered 2024-04-21: 40 mL

## 2024-04-21 MED ORDER — HEPARIN SODIUM (PORCINE) 1000 UNIT/ML IJ SOLN
INTRAMUSCULAR | Status: DC | PRN
  Administered 2024-04-21: 6000 [IU] via INTRAVENOUS
  Administered 2024-04-21: 2000 [IU] via INTRAVENOUS

## 2024-04-21 SURGICAL SUPPLY — 23 items
BALLOON MUSTANG 6X200X135 (BALLOONS) IMPLANT
BALLOON STERLING OTW 6X220X150 (BALLOONS) IMPLANT
CATH CXI SUPP 2.6F 135 ST (CATHETERS) IMPLANT
CATH OMNI FLUSH 5F 65CM (CATHETERS) IMPLANT
CATH QUICKCROSS SUPP .035X90CM (MICROCATHETER) IMPLANT
CATH SHOCKWAVE E8 6X80 (CATHETERS) IMPLANT
CLOSURE MYNX CONTROL 6F/7F (Vascular Products) IMPLANT
GLIDEWIRE ADV .035X260CM (WIRE) IMPLANT
KIT ENCORE 26 ADVANTAGE (KITS) IMPLANT
KIT MICROPUNCTURE NIT STIFF (SHEATH) IMPLANT
PACK CARDIAC CATHETERIZATION (CUSTOM PROCEDURE TRAY) IMPLANT
PAD HEMO NEPTUNE PLUS 2X2 (PAD) IMPLANT
SHEATH CATAPULT 6FR 45 (SHEATH) IMPLANT
SHEATH MICROPUNCTURE PEDAL 5FR (SHEATH) IMPLANT
SHEATH PINNACLE 5F 10CM (SHEATH) IMPLANT
SHEATH PINNACLE 6F 10CM (SHEATH) IMPLANT
SHEATH PROBE COVER 6X72 (BAG) IMPLANT
STENT ELUVIA 6X100X130 (Permanent Stent) IMPLANT
STENT ELUVIA 6X150X130 (Permanent Stent) IMPLANT
STENT ELUVIA 6X60X130 (Permanent Stent) IMPLANT
WIRE BENTSON .035X145CM (WIRE) IMPLANT
WIRE G V18X300CM (WIRE) IMPLANT
WIRE HI TORQ COMMND ES.014X300 (WIRE) IMPLANT

## 2024-04-21 NOTE — Op Note (Signed)
 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 Enberg is a 73 year old male with PAD and CKD.  He was originally seen in May of this year in the office and was noted to not have rest pain or wounds but was recently returned for follow-up with a message from his podiatrist noting a left heel wound.  Noninvasive vascular labs demonstrated noncompressibility in his ABI and a toe pressure of 60.  Risk benefits of angiogram with prevention were reviewed he  expressed understanding and elected to proceed  Findings:  Widely patent aorta.  infrarenal aortic ectasia and left common iliac artery ectasia.  Bilateral hypogastric arteries are patent.  Bilateral external iliac arteries are patent with mild calcific disease.  Left common femoral artery, profunda and SFA patent with significant medial calcinosis.  The left SFA has significant calcific disease throughout with a chronic total occlusion of at the distal SFA and reconstitution of the above-knee popliteal artery.  There is some calcific disease of the popliteal artery but with less than 50% stenosis.  There is three-vessel runoff.  Right common femoral artery, profunda are patent no significant calcific disease.  The SFA appears to have a flush occlusion with reconstitution of the distal SFA by profunda  collaterals.  The popliteal artery is widely patent.  There appears to be one-vessel runoff via the peroneal which reconstitutes the PT and DP at the foot.   Procedure:  The patient was identified in the holding area and taken to the cath lab  The patient was then placed supine on the table and prepped and draped in the usual sterile fashion.  A time out was called.  Ultrasound was used to evaluate the right common femoral artery.  It was patent .  A digital ultrasound image was acquired.  A micropuncture needle was used to access the right common femoral artery under ultrasound guidance.  An 018 wire was advanced without resistance and a micropuncture sheath was placed.  The 018 wire was removed and a benson wire was placed.  The micropuncture sheath was exchanged for a 5 french sheath.  An omniflush catheter was advanced over the wire to the level of L-1.  An abdominal angiogram was obtained.  Next, using the omniflush catheter and a glide advantage wire, the aortic bifurcation was crossed and the catheter was placed into theleft external iliac artery and left runoff was obtained. This demonstrated the above findings.  Bladder manage wire was placed through the catheter into the left SFA.  The short 5 French sheath was then exchanged for 6 [place] by 45 cm catapult sheath.  First I attempted to cross the SFA occlusion with a glide a manage wire and a quick cross catheter.  I was unable to cross the true lumen and attempted a subintimal crossing although was unable to traverse back into the true lumen in the popliteal.  I attempted with a V18 as well as an 014 command wire  was unsuccessful at reentering.  The foot was then prepped and draped and retrograde access of the left anterior tibial artery was obtained with ultrasound guidance.  The 014 command wire was placed into the anterior tibial artery and the pedal sheath was placed over this wire.  Using the multiple command wire and a CXI catheter I was able to  navigate across the SFA CTO and with the quick cross catheter was cannulated and body floss was obtained.  Given the significant calcific disease we started treatment with the 6 mm E8 shockwave.  All 400 pulses were deployed throughout the SFA with multiple treatments focused at the occlusion.  An angiogram from the sheath was then obtained which demonstrated patency but multiple areas of dissection and therefore the SFA was stented.  A 6 mm x 150 mm Eluvia stent was placed from the above-knee popliteal artery through the occlusion.  This was then extended further proximally with a 6 mm x 100 mm balloon.  It were postdilated with a 6 mm Sterling balloon and an angiogram demonstrated that there was patency of the stents although there was a residual stenosis and dissection with 4 cm proximal to the stent.  Therefore the stents were then extended proximally with a 6 mm x 60 mm Eluvia and again postdilated with a 6 mm balloon.  Completion angiography demonstrated wide patency of the treated segments with brisk flow through the stents and preserved runoff via the AT and PT.  The through and through access was then removed.  The long 6 French sheath was exchanged for a short 6 [place] sheath over a glide advantage wire and a right lower extremity CO2 angiogram was obtained which demonstrated the above findings. The pedal sheath was removed and manual pressure was held with hemostasis.  A Mynx closure device was then deployed in the right common femoral artery for hemostasis.  Contrast: 40 cc, 400 cc of CO2 Sedation: 110 minutes  Impression: Successful recanalization of the SFA occlusion with shockwave lithotripsy, and stenting of the SFA for total length of about 25 cm. Maximally revascularized left lower extremity with inline flow to the foot via the AT and PT.   Significant calcific disease of the right lower extremity with a proximal SFA occlusion and what appears to be one-vessel runoff via the  peroneal.   Norman GORMAN Serve MD [facility] [place] Office: [phone]

## 2024-04-21 NOTE — Interval H&P Note (Signed)
 History and Physical Interval Note:  04/21/2024 10:39 AM  Eddie [name]  has presented today for surgery, with the diagnosis of left leg critical limb ischemia.  The various methods of treatment have been discussed with the patient and family. After consideration of risks, benefits and other options for treatment, the patient has consented to  Procedure(s): ABDOMINAL AORTOGRAM (N/A) LOWER EXTREMITY INTERVENTION (Left) Lower Extremity Angiography (Left) as a surgical intervention.  The patient's history has been reviewed, patient examined, no change in status, stable for surgery.  I have reviewed the patient's chart and labs.  Questions were answered to the patient's satisfaction.     Norman GORMAN Serve

## 2024-04-21 NOTE — Discharge Instructions (Signed)
 NO METFORMIN  FOR 2 DAYS

## 2024-04-21 NOTE — Progress Notes (Addendum)
 Up and walked and tolerated well; right groin stable, no bleeding or hematoma; Dr Pearline  in and checked right groin earlier and no bleeding or hematoma

## 2024-04-22 ENCOUNTER — Encounter (HOSPITAL_COMMUNITY): Payer: Self-pay | Admitting: Vascular Surgery

## 2024-04-23 ENCOUNTER — Other Ambulatory Visit: Payer: Self-pay | Admitting: Vascular Surgery

## 2024-04-23 DIAGNOSIS — I70222 Atherosclerosis of native arteries of extremities with rest pain, left leg: Secondary | ICD-10-CM

## 2024-04-23 DIAGNOSIS — I739 Peripheral vascular disease, unspecified: Secondary | ICD-10-CM

## 2024-04-28 ENCOUNTER — Other Ambulatory Visit: Payer: Self-pay

## 2024-05-08 ENCOUNTER — Ambulatory Visit: Admitting: Podiatry

## 2024-05-18 ENCOUNTER — Other Ambulatory Visit: Payer: Self-pay

## 2024-05-19 ENCOUNTER — Other Ambulatory Visit: Payer: Self-pay

## 2024-05-21 ENCOUNTER — Other Ambulatory Visit (HOSPITAL_COMMUNITY): Payer: Self-pay

## 2024-05-21 ENCOUNTER — Other Ambulatory Visit: Payer: Self-pay | Admitting: Internal Medicine

## 2024-05-21 DIAGNOSIS — I5042 Chronic combined systolic (congestive) and diastolic (congestive) heart failure: Secondary | ICD-10-CM

## 2024-05-21 DIAGNOSIS — I2510 Atherosclerotic heart disease of native coronary artery without angina pectoris: Secondary | ICD-10-CM

## 2024-05-21 MED ORDER — ATORVASTATIN CALCIUM 80 MG PO TABS
80.0000 mg | ORAL_TABLET | Freq: Every day | ORAL | 0 refills | Status: DC
  Filled 2024-05-21: qty 90, 90d supply, fill #0

## 2024-05-21 MED ORDER — CARVEDILOL 25 MG PO TABS
25.0000 mg | ORAL_TABLET | Freq: Two times a day (BID) | ORAL | 0 refills | Status: DC
  Filled 2024-05-21: qty 180, 90d supply, fill #0

## 2024-05-21 MED ORDER — METFORMIN HCL 500 MG PO TABS
500.0000 mg | ORAL_TABLET | Freq: Two times a day (BID) | ORAL | 0 refills | Status: DC
  Filled 2024-05-21: qty 180, 90d supply, fill #0

## 2024-05-21 NOTE — Telephone Encounter (Signed)
 Medication sent to pharmacy

## 2024-05-23 ENCOUNTER — Ambulatory Visit (HOSPITAL_COMMUNITY): Attending: Vascular Surgery

## 2024-05-23 ENCOUNTER — Ambulatory Visit (HOSPITAL_COMMUNITY)

## 2024-05-23 ENCOUNTER — Encounter: Admitting: Vascular Surgery

## 2024-05-26 ENCOUNTER — Other Ambulatory Visit: Payer: Self-pay | Admitting: Internal Medicine

## 2024-05-26 ENCOUNTER — Other Ambulatory Visit (HOSPITAL_COMMUNITY): Payer: Self-pay

## 2024-05-26 DIAGNOSIS — E1140 Type 2 diabetes mellitus with diabetic neuropathy, unspecified: Secondary | ICD-10-CM

## 2024-05-26 MED ORDER — ACCU-CHEK FASTCLIX LANCETS MISC
1.0000 | Freq: Every day | 3 refills | Status: AC
  Filled 2024-05-26: qty 102, 90d supply, fill #0

## 2024-05-26 NOTE — Telephone Encounter (Signed)
 Medication sent to pharmacy

## 2024-05-28 NOTE — Progress Notes (Deleted)
 Patient ID: Eddie [name], male   DOB: 02/11/1951, 73 y.o.   MRN: [id number]  Reason for Consult: No chief complaint on file.   Referred by [name], DO  Subjective:     HPI Eddie [name] is a 73 y.o. male presenting for follow-up of PAD.  On 04/21/2024 he underwent left leg angiogram with recanalization and stenting of the left SFA with anterior tibial retrograde access.  At that time he had a small 5 mm ulcer of the left lateral heel with noncompressible ABI and a toe pressure 60. Today he reports ***  Past Medical History:  Diagnosis Date   Blindness of left eye    CAD (coronary artery disease)    a. STEMI 09/2015 w/ DES to Prox LAD   CVA (cerebral infarction)    a. 09/2015: acute small right frontal lobe infarct   Diabetes (HCC)    Hypertension    Ischemic cardiomyopathy    OSA (obstructive sleep apnea) 07/06/2017   Mild OSA with AHI of 8.2/hr with hypoxemia as low as 76% now on CPAP at 11cm H2O.   Peripheral vascular disease    PUD (peptic ulcer disease)    ST elevation myocardial infarction involving left anterior descending (LAD) coronary artery (HCC) 09/28/2015   Family History  Problem Relation Age of Onset   Diabetes Mother    Cancer Neg Hx    Past Surgical History:  Procedure Laterality Date   ABDOMINAL AORTOGRAM N/A 04/21/2024   Procedure: ABDOMINAL AORTOGRAM;  Surgeon: Pearline Norman RAMAN, MD;  [facility] INVASIVE CV LAB;  Service: Cardiovascular;  Laterality: N/A;   CARDIAC CATHETERIZATION N/A 09/28/2015   Procedure: Left Heart Cath and Coronary Angiography;  Surgeon: Dorn JINNY Lesches, MD;  Location: [facility] INVASIVE CV LAB;  Service: Cardiovascular;  Laterality: N/A;   CARDIAC CATHETERIZATION N/A 10/08/2015   Procedure: Left Heart Cath and Coronary Angiography;  Surgeon: Victory LELON Sharps, MD;  Location: [facility] INVASIVE CV LAB;  Service: Cardiovascular;  Laterality: N/A;   CATARACT EXTRACTION Right    LOWER EXTREMITY ANGIOGRAPHY Left 04/21/2024   Procedure: Lower  Extremity Angiography;  Surgeon: Pearline Norman RAMAN, MD;  Location: [facility] INVASIVE CV LAB;  Service: Cardiovascular;  Laterality: Left;   LOWER EXTREMITY INTERVENTION Left 04/21/2024   Procedure: LOWER EXTREMITY INTERVENTION;  Surgeon: Pearline Norman RAMAN, MD;  Location: [facility] INVASIVE CV LAB;  Service: Cardiovascular;  Laterality: Left;   Repair of Peptic Ulcer      Short Social History:  Social History   Tobacco Use   Smoking status: Every Day    Current packs/day: 0.30    Types: Cigarettes   Smokeless tobacco: Never  Substance Use Topics   Alcohol use: No    Alcohol/week: 0.0 standard drinks of alcohol    Allergies  Allergen Reactions   Bidil [Isosorb Dinitrate-Hydralazine ] Other (See Comments)    Patient became lightheaded with 1 tablet tid.     Current Outpatient Medications  Medication Sig Dispense Refill   Accu-Chek FastClix Lancets MISC Use to check blood sugar 1 time a day 102 each 3   acetaminophen  (TYLENOL ) 325 MG tablet Take 650 mg by mouth every 4 (four) hours as needed.     albuterol  (VENTOLIN  HFA) 108 (90 Base) MCG/ACT inhaler Inhale 2 puffs into the lungs every 6 (six) hours as needed for wheezing or shortness of breath. 8.5 g 2   aspirin  EC 81 MG tablet Take 1 tablet (81 mg total) by mouth daily. 90 tablet 3  atorvastatin  (LIPITOR ) 80 MG tablet Take 1 tablet (80 mg total) by mouth daily. 90 tablet 0   carvedilol  (COREG ) 25 MG tablet Take 1 tablet (25 mg total) by mouth 2 (two) times daily. 180 tablet 0   clopidogrel  (PLAVIX ) 75 MG tablet Take 1 tablet (75 mg total) by mouth daily. 90 tablet 0   dorzolamide -timolol  (COSOPT ) 2-0.5 % ophthalmic solution Place 1 drop into both eyes 2 (two) times daily. 15 mL 12   empagliflozin  (JARDIANCE ) 10 MG TABS tablet Take 1 tablet (10 mg total) by mouth daily before breakfast. 90 tablet 3   eplerenone  (INSPRA ) 25 MG tablet Take 1/2  tablet (12.5 mg total) by mouth daily. 45 tablet 3   ezetimibe  (ZETIA ) 10 MG tablet Take 1 tablet (10 mg  total) by mouth daily. 90 tablet 3   furosemide  (LASIX ) 40 MG tablet Take 1 tablet (40 mg total) by mouth as needed (for weight gain of 3 lbs in a day or 5 lbs in a week). 30 tablet 3   gentamicin  cream (GARAMYCIN ) 0.1 % Apply to affected area once daily. 30 g 1   glucose blood test strip USE TO CHECK BLOOD SUGAR ONCE DAILY 100 strip 12   Insulin  Pen Needle (UNIFINE PENTIPS) 31G X 5 MM MISC Use daily as directed 100 each 5   Lancets Misc. (ACCU-CHEK FASTCLIX LANCET) KIT Use as directed once daily 1 kit 1   latanoprost  (XALATAN ) 0.005 % ophthalmic solution Place 1 drop into both eyes every evening. 7.5 mL 12   lidocaine  (XYLOCAINE ) 5 % ointment Apply 1 Application topically as needed. 35.44 g 3   metFORMIN  (GLUCOPHAGE ) 500 MG tablet Take 1 tablet (500 mg total) by mouth 2 (two) times daily with a meal. 180 tablet 0   nitroGLYCERIN  (NITROSTAT ) 0.4 MG SL tablet Use one pill every 5 minutes X 3 if needed for chest pain. Contact MD if pain does not improve 30 tablet 0   sacubitril -valsartan  (ENTRESTO ) 49-51 MG Take 1 tablet by mouth 2 (two) times daily. 60 tablet 11   vitamin B-12 (CYANOCOBALAMIN ) 1000 MCG tablet Take 1 tablet (1,000 mcg total) by mouth daily. 90 tablet 1   No current facility-administered medications for this visit.    REVIEW OF SYSTEMS  All other systems were reviewed and are negative     Objective:  Objective   There were no vitals filed for this visit. There is no height or weight on file to calculate BMI.  Physical Exam General: no acute distress Cardiac: hemodynamically stable Abdomen: non-tender, no pulsatile mass*** Extremities: no edema, cyanosis or wounds*** Vascular:   Right: ***  Left: ***  Data: ABI ***  Duplex ***     Assessment/Plan:   Eddie [name] is a 73 y.o. male with PAD who recently underwent left SFA recanalization and stenting with a retrograde AT access for CL TI with a left heel wound. ***  Norman GORMAN Serve MD [facility]

## 2024-05-29 ENCOUNTER — Ambulatory Visit: Admitting: Podiatry

## 2024-05-29 VITALS — Wt 150.0 lb

## 2024-05-29 DIAGNOSIS — L97421 Non-pressure chronic ulcer of left heel and midfoot limited to breakdown of skin: Secondary | ICD-10-CM

## 2024-05-30 ENCOUNTER — Encounter: Payer: Self-pay | Admitting: Student

## 2024-05-30 ENCOUNTER — Ambulatory Visit: Payer: Self-pay | Admitting: Student

## 2024-05-30 ENCOUNTER — Ambulatory Visit: Admitting: Vascular Surgery

## 2024-05-30 ENCOUNTER — Other Ambulatory Visit: Payer: Self-pay

## 2024-05-30 VITALS — BP 155/65 | HR 67 | Temp 98.1°F | Ht 66.0 in | Wt 143.4 lb

## 2024-05-30 DIAGNOSIS — N1832 Chronic kidney disease, stage 3b: Secondary | ICD-10-CM

## 2024-05-30 DIAGNOSIS — E1140 Type 2 diabetes mellitus with diabetic neuropathy, unspecified: Secondary | ICD-10-CM

## 2024-05-30 DIAGNOSIS — E1122 Type 2 diabetes mellitus with diabetic chronic kidney disease: Secondary | ICD-10-CM | POA: Diagnosis not present

## 2024-05-30 DIAGNOSIS — Z0000 Encounter for general adult medical examination without abnormal findings: Secondary | ICD-10-CM

## 2024-05-30 DIAGNOSIS — I739 Peripheral vascular disease, unspecified: Secondary | ICD-10-CM

## 2024-05-30 DIAGNOSIS — Z23 Encounter for immunization: Secondary | ICD-10-CM | POA: Diagnosis not present

## 2024-05-30 DIAGNOSIS — Z7984 Long term (current) use of oral hypoglycemic drugs: Secondary | ICD-10-CM

## 2024-05-30 LAB — POCT GLYCOSYLATED HEMOGLOBIN (HGB A1C): HbA1c, POC (controlled diabetic range): 5.7 % (ref 0.0–7.0)

## 2024-05-30 LAB — GLUCOSE, CAPILLARY: Glucose-Capillary: 127 mg/dL — ABNORMAL HIGH (ref 70–99)

## 2024-05-30 NOTE — Assessment & Plan Note (Addendum)
 S/p revascularization (recanalization w/ shockwave lithotripsy and stenting) of left SFA occlusion. Needs to f/u w/ [facility] in office after procedure. On DAPT along w/ atorvastatin  80 mg and Zetia  10 mg. Last LDL 63 in 12/2023. Feeling improved since procedure.   Plan -Needs to schedule appt w/ [facility], contact info listed on AVS -Also patient plans to schedule visit w/ cardiology  -Continue DAPT, atorvastatin  and Zetia  -Repeat lipid panel today  Orders:   Lipid Profile

## 2024-05-30 NOTE — Assessment & Plan Note (Addendum)
 Status: Controlled/At Goal. Last A1c 6.3 in 08/2023.  A1c today is 5.7.  Currently taking Jardiance  10 mg, metformin  500 mg BID.   Plan -Continue Jardiance  and metformin   -A1c changed to every 6 months -Ophthalmology exam: needs appt w/ [facility] -Urine ACR: 01/2023, 30 on SGLT2/ARNI, repeat today -LDL 63 on 12/2023, taking atorvastatin  80 mg, Zetia  10 mg, repeat lipids today  Orders:   POC Hbg A1C   Microalbumin / Creatinine Urine Ratio   Glucose, capillary

## 2024-05-30 NOTE — Patient Instructions (Addendum)
 Thank you, Eddie [name] for allowing us  to provide your care today. Today we discussed:  [facility] [address], [place], [place] [address] Call: [phone] to schedule appointment   [facility] - Dr. Cherrie Call: [phone] to schedule appointment with cardiology   -Flu shot today.  -Blood work today, I will call with results.    Follow up: 2-3 months w/ Dr. Rosan   Should you have any questions or concerns please call the internal medicine clinic at [phone].    Tirzah Fross, D.O. [facility]

## 2024-05-30 NOTE — Assessment & Plan Note (Deleted)
 GDMT: Carvedilol  25 mg twice daily, Jardiance  10 mg, eplerenone  25 mg, Entresto  49-51 mg twice daily.  Also taking Lasix  40 mg as needed.  Last BMP 01/2024 with GFR declined to 22... (hx [id number]). Orders:   Basic metabolic panel with GFR

## 2024-05-30 NOTE — Progress Notes (Signed)
 CC: F/u visit  HPI: [name] is a 73 y.o. male living with a history stated below and presents today for f/u visit. Please see problem based assessment and plan for additional details.  History of Present Illness Discussed the use of AI scribe software for clinical note transcription with the patient, who gave verbal consent to proceed.  History of Present Illness   Eddie [name] is a 73 year old male with a history of PAD with left foot wound who presents for follow-up after revascularization by [facility]. He is accompanied by his wife.  He underwent angiogram on 9/29 w/ Dr. Pearline and found left SFA w/ chronic total occulusion of distal SFA and reconstitution of the above-knee popliteal artery. He and his wife cancelled the f/u appt due to wife being in the hospital so patient has not seen [facility] since procedure.   Post-surgery, he reports significant improvement in symptoms, including the ability to feel heat in both legs and stretch in the morning without pain throughout the day. He describes his legs as feeling 'equal out like they're supposed to now.'  He has a history of diabetes and is currently taking Jardiance  and metformin , the latter at a dose of two times a day. Victoza  was previously used but discontinued. He has not seen his eye doctor this year, with the last visit being the previous year. He is able to urinate today and reports no issues with breathing. He wants to receive his annual flu shot.   Review of Systems: ROS negative except for what is noted on the assessment and plan.   Past Medical History:  Diagnosis Date   Blindness of left eye    CAD (coronary artery disease)    a. STEMI 09/2015 w/ DES to Prox LAD   CVA (cerebral infarction)    a. 09/2015: acute small right frontal lobe infarct   Diabetes (HCC)    Hypertension    Ischemic cardiomyopathy    OSA (obstructive sleep apnea) 07/06/2017   Mild OSA with AHI of 8.2/hr with hypoxemia as low as 76% now on CPAP  at 11cm H2O.   Peripheral vascular disease    PUD (peptic ulcer disease)    ST elevation myocardial infarction involving left anterior descending (LAD) coronary artery (HCC) 09/28/2015    Current Outpatient Medications on File Prior to Visit  Medication Sig Dispense Refill   Accu-Chek FastClix Lancets MISC Use to check blood sugar 1 time a day 102 each 3   acetaminophen  (TYLENOL ) 325 MG tablet Take 650 mg by mouth every 4 (four) hours as needed.     albuterol  (VENTOLIN  HFA) 108 (90 Base) MCG/ACT inhaler Inhale 2 puffs into the lungs every 6 (six) hours as needed for wheezing or shortness of breath. 8.5 g 2   aspirin  EC 81 MG tablet Take 1 tablet (81 mg total) by mouth daily. 90 tablet 3   atorvastatin  (LIPITOR ) 80 MG tablet Take 1 tablet (80 mg total) by mouth daily. 90 tablet 0   carvedilol  (COREG ) 25 MG tablet Take 1 tablet (25 mg total) by mouth 2 (two) times daily. 180 tablet 0   clopidogrel  (PLAVIX ) 75 MG tablet Take 1 tablet (75 mg total) by mouth daily. 90 tablet 0   dorzolamide -timolol  (COSOPT ) 2-0.5 % ophthalmic solution Place 1 drop into both eyes 2 (two) times daily. 15 mL 12   empagliflozin  (JARDIANCE ) 10 MG TABS tablet Take 1 tablet (10 mg total) by mouth daily before breakfast. 90 tablet 3  eplerenone  (INSPRA ) 25 MG tablet Take 1/2  tablet (12.5 mg total) by mouth daily. 45 tablet 3   ezetimibe  (ZETIA ) 10 MG tablet Take 1 tablet (10 mg total) by mouth daily. 90 tablet 3   furosemide  (LASIX ) 40 MG tablet Take 1 tablet (40 mg total) by mouth as needed (for weight gain of 3 lbs in a day or 5 lbs in a week). 30 tablet 3   gentamicin  cream (GARAMYCIN ) 0.1 % Apply to affected area once daily. 30 g 1   glucose blood test strip USE TO CHECK BLOOD SUGAR ONCE DAILY 100 strip 12   Insulin  Pen Needle (UNIFINE PENTIPS) 31G X 5 MM MISC Use daily as directed 100 each 5   Lancets Misc. (ACCU-CHEK FASTCLIX LANCET) KIT Use as directed once daily 1 kit 1   latanoprost  (XALATAN ) 0.005 % ophthalmic  solution Place 1 drop into both eyes every evening. 7.5 mL 12   lidocaine  (XYLOCAINE ) 5 % ointment Apply 1 Application topically as needed. 35.44 g 3   metFORMIN  (GLUCOPHAGE ) 500 MG tablet Take 1 tablet (500 mg total) by mouth 2 (two) times daily with a meal. 180 tablet 0   nitroGLYCERIN  (NITROSTAT ) 0.4 MG SL tablet Use one pill every 5 minutes X 3 if needed for chest pain. Contact MD if pain does not improve 30 tablet 0   sacubitril -valsartan  (ENTRESTO ) 49-51 MG Take 1 tablet by mouth 2 (two) times daily. 60 tablet 11   vitamin B-12 (CYANOCOBALAMIN ) 1000 MCG tablet Take 1 tablet (1,000 mcg total) by mouth daily. 90 tablet 1   No current facility-administered medications on file prior to visit.    Social History   Socioeconomic History   Marital status: Married    Spouse name: Stage Manager   Number of children: 2   Years of education: Not on file   Highest education level: Not on file  Occupational History   Occupation: Tefl Teacher: [employer]  Tobacco Use   Smoking status: Every Day    Current packs/day: 0.30    Types: Cigarettes   Smokeless tobacco: Never  Vaping Use   Vaping status: Never Used  Substance and Sexual Activity   Alcohol use: No    Alcohol/week: 0.0 standard drinks of alcohol   Drug use: No   Sexual activity: Not on file  Other Topics Concern   Not on file  Social History Narrative   Lives with wife.   Social Drivers of Corporate Investment Banker Strain: Low Risk  (07/04/2023)   Overall Financial Resource Strain (CARDIA)    Difficulty of Paying Living Expenses: Not hard at all  Food Insecurity: Food Insecurity Present (05/30/2024)   Hunger Vital Sign    Worried About Running Out of Food in the Last Year: Often true    Ran Out of Food in the Last Year: Often true  Transportation Needs: No Transportation Needs (05/30/2024)   PRAPARE - Administrator, Civil Service (Medical): No    Lack of Transportation (Non-Medical): No   Physical Activity: Inactive (07/04/2023)   Exercise Vital Sign    Days of Exercise per Week: 0 days    Minutes of Exercise per Session: 0 min  Stress: No Stress Concern Present (07/04/2023)   [employer] of Occupational Health - Occupational Stress Questionnaire    Feeling of Stress : Only a little  Social Connections: Socially Isolated (07/04/2023)   Social Connection and Isolation Panel  Frequency of Communication with Friends and Family: Twice a week    Frequency of Social Gatherings with Friends and Family: Never    Attends Religious Services: Never    Database Administrator or Organizations: No    Attends Banker Meetings: Never    Marital Status: Married  Catering Manager Violence: Not At Risk (05/30/2024)   Humiliation, Afraid, Rape, and Kick questionnaire    Fear of Current or Ex-Partner: No    Emotionally Abused: No    Physically Abused: No    Sexually Abused: No      Vitals:   05/30/24 0924 05/30/24 0928  BP: (!) 151/70 (!) 155/65  Pulse: 70 67  Temp: 98.1 F (36.7 C)   TempSrc: Oral   SpO2: 100%   Weight: 143 lb 6.4 oz (65 kg)   Height: 5' 6 (1.676 m)    Physical Exam: Constitutional: alert, sitting up in chair comfortably, in no acute distress Cardiovascular: normal rate and rhythm  Pulmonary/Chest: normal work of breathing on room air, lungs clear to auscultation bilaterally Neurological: awake and alert  Skin: warm and dry   Assessment & Plan:   Assessment & Plan PAD (peripheral artery disease) S/p revascularization (recanalization w/ shockwave lithotripsy and stenting) of left SFA occlusion. Needs to f/u w/ [facility] in office after procedure. On DAPT along w/ atorvastatin  80 mg and Zetia  10 mg. Last LDL 63 in 12/2023. Feeling improved since procedure.   Plan -Needs to schedule appt w/ [facility], contact info listed on AVS -Also patient plans to schedule visit w/ cardiology  -Continue DAPT, atorvastatin  and Zetia  -Repeat lipid panel  today  Orders:   Lipid Profile  Controlled type 2 diabetes mellitus with diabetic neuropathy, without long-term current use of insulin  (HCC) Status: Controlled/At Goal. Last A1c 6.3 in 08/2023.  A1c today is 5.7.  Currently taking Jardiance  10 mg, metformin  500 mg BID.   Plan -Continue Jardiance  and metformin   -A1c changed to every 6 months -Ophthalmology exam: needs appt w/ [name] -Urine ACR: 01/2023, 30 on SGLT2/ARNI, repeat today -LDL 63 on 12/2023, taking atorvastatin  80 mg, Zetia  10 mg, repeat lipids today  Orders:   POC Hbg A1C   Microalbumin / Creatinine Urine Ratio   Glucose, capillary  Stage 3b chronic kidney disease (HCC) Last BMP 01/2024 with GFR down to 22 (hx CKD3B). Repeat BMP today to assess renal function and if there is change in baseline.    Orders:   Basic metabolic panel with GFR  Health care maintenance Encounter for immunization Orders:   Flu vaccine HIGH DOSE PF(Fluzone Trivalent)    Return in about 2 months (around 07/30/2024) for Routine visit w/ Dr. Rosan.    Patient discussed with Dr. Karna Ozell Nearing, D.O. [facility] Internal Medicine, PGY-3 Clinic Phone: [phone] Date 05/30/2024 Time 4:52 PM

## 2024-05-30 NOTE — Assessment & Plan Note (Addendum)
  Orders:   Flu vaccine HIGH DOSE PF(Fluzone Trivalent)

## 2024-05-30 NOTE — Assessment & Plan Note (Signed)
 Last BMP 01/2024 with GFR down to 22 (hx CKD3B). Repeat BMP today to assess renal function and if there is change in baseline.    Orders:   Basic metabolic panel with GFR

## 2024-05-31 LAB — BASIC METABOLIC PANEL WITH GFR
BUN/Creatinine Ratio: 11 (ref 10–24)
BUN: 19 mg/dL (ref 8–27)
CO2: 25 mmol/L (ref 20–29)
Calcium: 8.4 mg/dL — ABNORMAL LOW (ref 8.6–10.2)
Chloride: 106 mmol/L (ref 96–106)
Creatinine, Ser: 1.68 mg/dL — ABNORMAL HIGH (ref 0.76–1.27)
Glucose: 109 mg/dL — ABNORMAL HIGH (ref 70–99)
Potassium: 3.9 mmol/L (ref 3.5–5.2)
Sodium: 142 mmol/L (ref 134–144)
eGFR: 43 mL/min/1.73 — ABNORMAL LOW (ref >59)

## 2024-05-31 LAB — LIPID PANEL
Chol/HDL Ratio: 2.4 ratio (ref 0.0–5.0)
Cholesterol, Total: 110 mg/dL (ref 100–199)
HDL: 46 mg/dL (ref >39)
LDL Chol Calc (NIH): 52 mg/dL (ref 0–99)
Triglycerides: 53 mg/dL (ref 0–149)
VLDL Cholesterol Cal: 12 mg/dL (ref 5–40)

## 2024-06-01 ENCOUNTER — Encounter: Payer: Self-pay | Admitting: Podiatry

## 2024-06-01 LAB — MICROALBUMIN / CREATININE URINE RATIO
Creatinine, Urine: 64.2 mg/dL
Microalb/Creat Ratio: 50 mg/g{creat} — ABNORMAL HIGH (ref 0–29)
Microalbumin, Urine: 32.3 ug/mL

## 2024-06-01 NOTE — Progress Notes (Signed)
 Subjective:  Patient ID: Eddie [name], male    DOB: 1951-01-13,  MRN: [id number]  Chief Complaint  Patient presents with   Wound Check    Rm 8 Patient is here for ulcer of left heel  is scabbed over with no visible drainage.    73 y.o. male presents with the above complaint. History confirmed with patient.  Doing much better after his last angiogram he has had quite a bit of improvement in pain.  Objective:  Physical Exam: Left foot pulses are nonpalpable there is mild edema, full-thickness ulcer on the lateral heel measuring 0.2 x 0.2 x 0.2 cm.  Exposed subcutaneous tissue with fibrosis tender to palpation.Surrounding hyperkeratosis no active drainage or signs of infection.  Right hallux ingrown removal site has healed fairly well         LOWER EXTREMITY DOPPLER STUDY  Patient Name:  Eddie [name]  Date of Exam:   12/13/2023 Medical Rec #: [id number]      Accession #:    [id number] Date of Birth: [birth year]      Patient Gender: M Patient Age:   14 years Exam Location:  [address] Procedure:      VAS US  ABI WITH/WO TBI Referring Phys: JOSHUA ROBINS   --------------------------------------------------------------------------- -----   Indications: Peripheral artery disease.  High Risk Factors: Hypertension, coronary artery disease, prior CVA.    Comparison Study: 06/07/23  Performing Technologist: Duwaine Hives RVS    Examination Guidelines: A complete evaluation includes at minimum, Doppler waveform signals and systolic blood pressure reading at the level of bilateral brachial, anterior tibial, and posterior tibial arteries, when vessel segments are accessible. Bilateral testing is considered an integral part of a complete examination. Photoelectric Plethysmograph (PPG) waveforms and toe systolic pressure readings are included as required and additional duplex testing as needed. Limited examinations for reoccurring indications may be  performed as noted.    ABI Findings: +---------+------------------+-----+----------+--------+ Right    Rt Pressure (mmHg)IndexWaveform  Comment  +---------+------------------+-----+----------+--------+ Brachial 151                                       +---------+------------------+-----+----------+--------+ PTA      194               1.28 monophasic         +---------+------------------+-----+----------+--------+ DP       254               1.68 monophasic         +---------+------------------+-----+----------+--------+ Great Toe75                0.50                    +---------+------------------+-----+----------+--------+  +---------+------------------+-----+----------+-------+ Left     Lt Pressure (mmHg)IndexWaveform  Comment +---------+------------------+-----+----------+-------+ Brachial 140                                      +---------+------------------+-----+----------+-------+ PTA      254               1.68 monophasic        +---------+------------------+-----+----------+-------+ DP       149               0.99 monophasic        +---------+------------------+-----+----------+-------+  Great Toe54                0.36                   +---------+------------------+-----+----------+-------+  +-------+-----------+-----------+------------+------------+ ABI/TBIToday's ABIToday's TBIPrevious ABIPrevious TBI +-------+-----------+-----------+------------+------------+ Right  [place]         0.50       [place]          0.0          +-------+-----------+-----------+------------+------------+ Left   [place]         0.36       [place]          0.20         +-------+-----------+-----------+------------+------------+      Bilateral ABIs appear essentially unchanged compared to prior study on 06/07/23.   Summary: Right: Resting right ankle-brachial index indicates noncompressible right lower extremity arteries. The  right toe-brachial index is abnormal.  Left: Resting left ankle-brachial index indicates noncompressible left lower extremity arteries. The left toe-brachial index is abnormal.   Assessment:   1. Ischemic ulcer of left heel, limited to breakdown of skin [facility])       Plan:  Patient was evaluated and treated and all questions answered.  PAD, ischemic ulcer of left heel Continues to improve.  He will continue to gentamicin  ointment.  Doing much better after his most recent angiography.  I will see him back in 1 month for an additional visit I expect he likely will be healed by that point.  Return in about 1 month (around 06/28/2024) for wound care.

## 2024-06-02 ENCOUNTER — Other Ambulatory Visit (HOSPITAL_COMMUNITY): Payer: Self-pay

## 2024-06-02 ENCOUNTER — Ambulatory Visit: Payer: Self-pay | Admitting: Student

## 2024-06-02 NOTE — Progress Notes (Signed)
 Internal Medicine Clinic Attending  Case discussed with the resident at the time of the visit.  We reviewed the resident's history and exam and pertinent patient test results.  I agree with the assessment, diagnosis, and plan of care documented in the resident's note.

## 2024-06-04 ENCOUNTER — Other Ambulatory Visit (HOSPITAL_COMMUNITY): Payer: Self-pay

## 2024-06-04 MED ORDER — LATANOPROST 0.005 % OP SOLN
1.0000 [drp] | Freq: Every evening | OPHTHALMIC | 0 refills | Status: DC
  Filled 2024-06-04: qty 2.5, 25d supply, fill #0

## 2024-06-13 ENCOUNTER — Ambulatory Visit

## 2024-06-13 ENCOUNTER — Encounter (HOSPITAL_COMMUNITY)

## 2024-07-03 ENCOUNTER — Ambulatory Visit: Admitting: Podiatry

## 2024-07-03 VITALS — Ht 66.0 in | Wt 143.4 lb

## 2024-07-03 DIAGNOSIS — L97421 Non-pressure chronic ulcer of left heel and midfoot limited to breakdown of skin: Secondary | ICD-10-CM | POA: Diagnosis not present

## 2024-07-03 DIAGNOSIS — M79675 Pain in left toe(s): Secondary | ICD-10-CM | POA: Diagnosis not present

## 2024-07-03 DIAGNOSIS — B351 Tinea unguium: Secondary | ICD-10-CM | POA: Diagnosis not present

## 2024-07-03 DIAGNOSIS — M79674 Pain in right toe(s): Secondary | ICD-10-CM | POA: Diagnosis not present

## 2024-07-04 ENCOUNTER — Other Ambulatory Visit (HOSPITAL_COMMUNITY): Payer: Self-pay

## 2024-07-04 ENCOUNTER — Encounter (HOSPITAL_COMMUNITY)

## 2024-07-04 ENCOUNTER — Other Ambulatory Visit: Payer: Self-pay

## 2024-07-04 ENCOUNTER — Ambulatory Visit

## 2024-07-06 ENCOUNTER — Other Ambulatory Visit: Payer: Self-pay

## 2024-07-07 ENCOUNTER — Other Ambulatory Visit (HOSPITAL_COMMUNITY): Payer: Self-pay

## 2024-07-07 MED ORDER — DORZOLAMIDE HCL-TIMOLOL MAL 2-0.5 % OP SOLN
1.0000 [drp] | Freq: Two times a day (BID) | OPHTHALMIC | 12 refills | Status: AC
  Filled 2024-07-07: qty 10, 100d supply, fill #0

## 2024-07-07 MED ORDER — LATANOPROST 0.005 % OP SOLN
1.0000 [drp] | Freq: Every evening | OPHTHALMIC | 12 refills | Status: AC
  Filled 2024-07-07: qty 2.5, 50d supply, fill #0

## 2024-07-09 ENCOUNTER — Ambulatory Visit: Payer: 59

## 2024-07-09 ENCOUNTER — Other Ambulatory Visit (HOSPITAL_COMMUNITY): Payer: Self-pay

## 2024-07-09 MED ORDER — LIDOCAINE 5 % EX OINT
1.0000 | TOPICAL_OINTMENT | CUTANEOUS | 3 refills | Status: AC | PRN
  Filled 2024-07-09: qty 35.44, 30d supply, fill #0

## 2024-07-09 NOTE — Progress Notes (Signed)
 Subjective:  Patient ID: Eddie [name], male    DOB: 30-Apr-1951,  MRN: [id number]  Chief Complaint  Patient presents with   Wound Check    RM 9 Patient is here for ulcer of the left foot. Wound is closed, no drainage or signs of infection.    73 y.o. male presents with the above complaint. History confirmed with patient.  Doing much better, his nails are thickened elongated causing pain and discomfort in shoe gear as well.  Objective:  Physical Exam: Left foot pulses are nonpalpable there is mild edema, full-thickness ulcer on the lateral heel has completely epithelialized no signs of infection or recurrence.  Painful thick elongated mycotic nails with subungual debris x 10        LOWER EXTREMITY DOPPLER STUDY  Patient Name:  Eddie [name]  Date of Exam:   12/13/2023 Medical Rec #: [id number]      Accession #:    [id number] Date of Birth: 1951-05-11      Patient Gender: M Patient Age:   76 years Exam Location:  [address] Procedure:      VAS US  ABI WITH/WO TBI Referring Phys: JOSHUA ROBINS   --------------------------------------------------------------------------- -----   Indications: Peripheral artery disease.  High Risk Factors: Hypertension, coronary artery disease, prior CVA.    Comparison Study: 06/07/23  Performing Technologist: Duwaine Hives RVS    Examination Guidelines: A complete evaluation includes at minimum, Doppler waveform signals and systolic blood pressure reading at the level of bilateral brachial, anterior tibial, and posterior tibial arteries, when vessel segments are accessible. Bilateral testing is considered an integral part of a complete examination. Photoelectric Plethysmograph (PPG) waveforms and toe systolic pressure readings are included as required and additional duplex testing as needed. Limited examinations for reoccurring indications may be performed as noted.    ABI  Findings: +---------+------------------+-----+----------+--------+ Right    Rt Pressure (mmHg)IndexWaveform  Comment  +---------+------------------+-----+----------+--------+ Brachial 151                                       +---------+------------------+-----+----------+--------+ PTA      194               1.28 monophasic         +---------+------------------+-----+----------+--------+ DP       254               1.68 monophasic         +---------+------------------+-----+----------+--------+ Great Toe75                0.50                    +---------+------------------+-----+----------+--------+  +---------+------------------+-----+----------+-------+ Left     Lt Pressure (mmHg)IndexWaveform  Comment +---------+------------------+-----+----------+-------+ Brachial 140                                      +---------+------------------+-----+----------+-------+ PTA      254               1.68 monophasic        +---------+------------------+-----+----------+-------+ DP       149               0.99 monophasic        +---------+------------------+-----+----------+-------+ [place]  0.36                   +---------+------------------+-----+----------+-------+  +-------+-----------+-----------+------------+------------+ ABI/TBIToday's ABIToday's TBIPrevious ABIPrevious TBI +-------+-----------+-----------+------------+------------+ Right  [place]         0.50       [place]          0.0          +-------+-----------+-----------+------------+------------+ Left   [place]         0.36       [place]          0.20         +-------+-----------+-----------+------------+------------+      Bilateral ABIs appear essentially unchanged compared to prior study on 06/07/23.   Summary: Right: Resting right ankle-brachial index indicates noncompressible right lower extremity arteries. The right toe-brachial index is  abnormal.  Left: Resting left ankle-brachial index indicates noncompressible left lower extremity arteries. The left toe-brachial index is abnormal.   Assessment:   1. Ischemic ulcer of left heel, limited to breakdown of skin (HCC)   2. Pain due to onychomycosis of toenails of both feet       Plan:  Patient was evaluated and treated and all questions answered.  PAD, ischemic ulcer of left heel Doing much better and essentially has healed at this point he does still have continued discomfort at the area and I refilled his lidocaine  ointment to utilize as needed.  Follow-up with me as needed for this.  Discussed the etiology and treatment options for the condition in detail with the patient. Recommended debridement of the nails today. Sharp and mechanical debridement performed of all painful and mycotic nails today. Nails debrided in length and thickness using a nail nipper to level of comfort. Follow up as needed for painful nails.    Return in about 3 months (around 10/01/2024) for at risk diabetic foot care.

## 2024-07-11 ENCOUNTER — Other Ambulatory Visit: Payer: Self-pay | Admitting: Internal Medicine

## 2024-07-11 ENCOUNTER — Other Ambulatory Visit (HOSPITAL_COMMUNITY): Payer: Self-pay

## 2024-07-11 NOTE — Telephone Encounter (Unsigned)
 Copied from CRM #[id number]. Topic: Clinical - Medication Refill >> Jul 11, 2024  8:54 AM Carrielelia G wrote: Medication: clopidogrel  (PLAVIX ) 75 MG tablet  Has the patient contacted their pharmacy? Yes (Agent: If no, request that the patient contact the pharmacy for the refill. If patient does not wish to contact the pharmacy document the reason why and proceed with request.) (Agent: If yes, when and what did the pharmacy advise?)  This is the patient's preferred pharmacy:  [facility] [address] [place] [address] Phone: [phone] Fax: [phone]  Is this the correct pharmacy for this prescription? Yes If no, delete pharmacy and type the correct one.    Is the patient out of the medication? No  Has the patient been seen for an appointment in the last year OR does the patient have an upcoming appointment? yes  Can we respond through MyChart? No  Agent: Please be advised that Rx refills may take up to 3 business days. We ask that you follow-up with your pharmacy.

## 2024-07-14 ENCOUNTER — Other Ambulatory Visit: Payer: Self-pay | Admitting: Internal Medicine

## 2024-07-14 ENCOUNTER — Other Ambulatory Visit (HOSPITAL_COMMUNITY): Payer: Self-pay

## 2024-07-14 ENCOUNTER — Other Ambulatory Visit: Payer: Self-pay

## 2024-07-14 MED ORDER — CLOPIDOGREL BISULFATE 75 MG PO TABS
75.0000 mg | ORAL_TABLET | Freq: Every day | ORAL | 0 refills | Status: AC
  Filled 2024-07-14: qty 90, 90d supply, fill #0

## 2024-07-14 MED ORDER — CLOPIDOGREL BISULFATE 75 MG PO TABS
75.0000 mg | ORAL_TABLET | Freq: Every day | ORAL | 5 refills | Status: AC
  Filled 2024-07-14: qty 90, 90d supply, fill #0

## 2024-07-14 NOTE — Telephone Encounter (Signed)
 Copied from CRM #[id number]. Topic: Clinical - Medication Refill >> Jul 11, 2024  8:54 AM Carrielelia G wrote: Medication: clopidogrel  (PLAVIX ) 75 MG tablet  Has the patient contacted their pharmacy? Yes (Agent: If no, request that the patient contact the pharmacy for the refill. If patient does not wish to contact the pharmacy document the reason why and proceed with request.) (Agent: If yes, when and what did the pharmacy advise?)  This is the patient's preferred pharmacy:  [facility] [address], [address] [place] [address] Phone: [phone] Fax: [phone]  Is this the correct pharmacy for this prescription? Yes If no, delete pharmacy and type the correct one.    Is the patient out of the medication? No  Has the patient been seen for an appointment in the last year OR does the patient have an upcoming appointment? yes  Can we respond through MyChart? No  Agent: Please be advised that Rx refills may take up to 3 business days. We ask that you follow-up with your pharmacy. >> Jul 14, 2024  9:52 AM Graeme ORN wrote: Patient wife called to check status. Called pharmacy and they hae not received Rx. Shows pending 12/19. Patient is now completely out. Thank You

## 2024-07-14 NOTE — Telephone Encounter (Unsigned)
 Copied from CRM #[id number]. Topic: Clinical - Medication Refill >> Jul 11, 2024  8:54 AM Carrielelia G wrote: Medication: clopidogrel  (PLAVIX ) 75 MG tablet  Has the patient contacted their pharmacy? Yes (Agent: If no, request that the patient contact the pharmacy for the refill. If patient does not wish to contact the pharmacy document the reason why and proceed with request.) (Agent: If yes, when and what did the pharmacy advise?)  This is the patient's preferred pharmacy:  [facility] [facility] [address], [address] [place] [address] Phone: [phone] Fax: [phone]  Is this the correct pharmacy for this prescription? Yes If no, delete pharmacy and type the correct one.    Is the patient out of the medication? No  Has the patient been seen for an appointment in the last year OR does the patient have an upcoming appointment? yes  Can we respond through MyChart? No  Agent: Please be advised that Rx refills may take up to 3 business days. We ask that you follow-up with your pharmacy. >> Jul 14, 2024  9:52 AM Graeme ORN wrote: Patient wife called to check status. Called pharmacy and they hae not received Rx. Shows pending 12/19. Patient is now completely out. Thank You

## 2024-07-17 ENCOUNTER — Other Ambulatory Visit (HOSPITAL_COMMUNITY): Payer: Self-pay | Admitting: Family Medicine

## 2024-07-18 ENCOUNTER — Other Ambulatory Visit (HOSPITAL_COMMUNITY): Payer: Self-pay

## 2024-07-18 MED ORDER — SACUBITRIL-VALSARTAN 49-51 MG PO TABS
1.0000 | ORAL_TABLET | Freq: Two times a day (BID) | ORAL | 11 refills | Status: AC
  Filled 2024-07-18: qty 60, 30d supply, fill #0
  Filled 2024-08-13: qty 60, 30d supply, fill #1

## 2024-08-13 ENCOUNTER — Other Ambulatory Visit: Payer: Self-pay

## 2024-08-14 ENCOUNTER — Other Ambulatory Visit (HOSPITAL_COMMUNITY): Payer: Self-pay

## 2024-08-15 ENCOUNTER — Other Ambulatory Visit (HOSPITAL_COMMUNITY): Payer: Self-pay

## 2024-08-15 ENCOUNTER — Other Ambulatory Visit: Payer: Self-pay | Admitting: Internal Medicine

## 2024-08-15 DIAGNOSIS — I5042 Chronic combined systolic (congestive) and diastolic (congestive) heart failure: Secondary | ICD-10-CM

## 2024-08-15 DIAGNOSIS — I2510 Atherosclerotic heart disease of native coronary artery without angina pectoris: Secondary | ICD-10-CM

## 2024-08-15 MED ORDER — ATORVASTATIN CALCIUM 80 MG PO TABS
80.0000 mg | ORAL_TABLET | Freq: Every day | ORAL | 0 refills | Status: AC
  Filled 2024-08-15: qty 90, 90d supply, fill #0

## 2024-08-15 MED ORDER — CARVEDILOL 25 MG PO TABS
25.0000 mg | ORAL_TABLET | Freq: Two times a day (BID) | ORAL | 0 refills | Status: AC
  Filled 2024-08-15: qty 180, 90d supply, fill #0

## 2024-08-21 ENCOUNTER — Other Ambulatory Visit (HOSPITAL_COMMUNITY): Payer: Self-pay

## 2024-08-22 ENCOUNTER — Ambulatory Visit: Admitting: Physician Assistant

## 2024-08-22 ENCOUNTER — Encounter: Payer: Self-pay | Admitting: Physician Assistant

## 2024-08-22 ENCOUNTER — Ambulatory Visit (HOSPITAL_COMMUNITY)
Admission: RE | Admit: 2024-08-22 | Discharge: 2024-08-22 | Disposition: A | Source: Ambulatory Visit | Attending: Vascular Surgery

## 2024-08-22 VITALS — BP 164/69 | HR 69 | Ht 66.0 in | Wt 143.3 lb

## 2024-08-22 DIAGNOSIS — I739 Peripheral vascular disease, unspecified: Secondary | ICD-10-CM | POA: Insufficient documentation

## 2024-08-22 DIAGNOSIS — I70244 Atherosclerosis of native arteries of left leg with ulceration of heel and midfoot: Secondary | ICD-10-CM | POA: Diagnosis present

## 2024-08-22 DIAGNOSIS — I70211 Atherosclerosis of native arteries of extremities with intermittent claudication, right leg: Secondary | ICD-10-CM | POA: Insufficient documentation

## 2024-08-22 LAB — VAS US ABI WITH/WO TBI

## 2024-08-22 NOTE — Progress Notes (Signed)
 " Office Note     CC:  follow up Requesting Provider:  [name], DO  HPI: Eddie [name] is a 74 y.o. (1950-11-03) male who presents for surveillance follow up of PAD. He recently underwent Aortogram, Arteriogram of LLE with intravascular lithotripsy of left SFA, shockwave and Balloon angioplasty and stenting of left SFA by Dr. Pearline on 04/21/24. This was performed due to left heel wound. He is followed by Dr. Silva at [facility].   Today he reports his legs are feeling good. No pain at rest or tissue loss. His left foot wound is healed. He does report coldness in his feet but he says it is the sensation of cold. Whenever his wife or someone feels his feet they tell him they are warm. He says he tries to stay active. He is less so in the winter but he and his wife usually walk close to their home when the weather is nicer. He is medically managed on Aspirin , statin and Plavix . Continues to smoke 1/3 ppd  Past Medical History:  Diagnosis Date   Blindness of left eye    CAD (coronary artery disease)    a. STEMI 09/2015 w/ DES to Prox LAD   CVA (cerebral infarction)    a. 09/2015: acute small right frontal lobe infarct   Diabetes (HCC)    Hypertension    Ischemic cardiomyopathy    OSA (obstructive sleep apnea) 07/06/2017   Mild OSA with AHI of 8.2/hr with hypoxemia as low as 76% now on CPAP at 11cm H2O.   Peripheral vascular disease    PUD (peptic ulcer disease)    ST elevation myocardial infarction involving left anterior descending (LAD) coronary artery (HCC) 09/28/2015    Past Surgical History:  Procedure Laterality Date   ABDOMINAL AORTOGRAM N/A 04/21/2024   Procedure: ABDOMINAL AORTOGRAM;  Surgeon: Pearline Norman RAMAN, MD;  Location: [facility] INVASIVE CV LAB;  Service: Cardiovascular;  Laterality: N/A;   CARDIAC CATHETERIZATION N/A 09/28/2015   Procedure: Left Heart Cath and Coronary Angiography;  Surgeon: Dorn JINNY Lesches, MD;  Location: [facility] INVASIVE CV LAB;  Service:  Cardiovascular;  Laterality: N/A;   CARDIAC CATHETERIZATION N/A 10/08/2015   Procedure: Left Heart Cath and Coronary Angiography;  Surgeon: Victory LELON Sharps, MD;  Location: [facility] INVASIVE CV LAB;  Service: Cardiovascular;  Laterality: N/A;   CATARACT EXTRACTION Right    LOWER EXTREMITY ANGIOGRAPHY Left 04/21/2024   Procedure: Lower Extremity Angiography;  Surgeon: Pearline Norman RAMAN, MD;  Location: [facility] INVASIVE CV LAB;  Service: Cardiovascular;  Laterality: Left;   LOWER EXTREMITY INTERVENTION Left 04/21/2024   Procedure: LOWER EXTREMITY INTERVENTION;  Surgeon: Pearline Norman RAMAN, MD;  Location: [facility] INVASIVE CV LAB;  Service: Cardiovascular;  Laterality: Left;   Repair of Peptic Ulcer      Social History   Socioeconomic History   Marital status: Married    Spouse name: [name]   Number of children: 2   Years of education: Not on file   Highest education level: Not on file  Occupational History   Occupation: Retired/grill Multimedia Programmer: [employer]  Tobacco Use   Smoking status: Every Day    Current packs/day: 0.30    Types: Cigarettes   Smokeless tobacco: Never   Tobacco comments:    4-5 cigarettes per day  Vaping Use   Vaping status: Never Used  Substance and Sexual Activity   Alcohol use: No    Alcohol/week: 0.0 standard drinks  of alcohol   Drug use: No   Sexual activity: Not on file  Other Topics Concern   Not on file  Social History Narrative   Lives with wife.   Social Drivers of Health   Tobacco Use: High Risk (08/22/2024)   Patient History    Smoking Tobacco Use: Every Day    Smokeless Tobacco Use: Never    Passive Exposure: Not on file  Financial Resource Strain: Low Risk (07/04/2023)   Overall Financial Resource Strain (CARDIA)    Difficulty of Paying Living Expenses: Not hard at all  Food Insecurity: Food Insecurity Present (05/30/2024)   Epic    Worried About Programme Researcher, Broadcasting/film/video in the Last Year: Often true    Ran Out of Food in the Last Year: Often true   Transportation Needs: No Transportation Needs (05/30/2024)   Epic    Lack of Transportation (Medical): No    Lack of Transportation (Non-Medical): No  Physical Activity: Inactive (07/04/2023)   Exercise Vital Sign    Days of Exercise per Week: 0 days    Minutes of Exercise per Session: 0 min  Stress: No Stress Concern Present (07/04/2023)   [employer] of Occupational Health - Occupational Stress Questionnaire    Feeling of Stress : Only a little  Social Connections: Socially Isolated (07/04/2023)   Social Connection and Isolation Panel    Frequency of Communication with Friends and Family: Twice a week    Frequency of Social Gatherings with Friends and Family: Never    Attends Religious Services: Never    Database Administrator or Organizations: No    Attends Banker Meetings: Never    Marital Status: Married  Catering Manager Violence: Not At Risk (05/30/2024)   Epic    Fear of Current or Ex-Partner: No    Emotionally Abused: No    Physically Abused: No    Sexually Abused: No  Depression (PHQ2-9): Low Risk (05/30/2024)   Depression (PHQ2-9)    PHQ-2 Score: 0  Alcohol Screen: Low Risk (07/04/2023)   Alcohol Screen    Last Alcohol Screening Score (AUDIT): 0  Housing: Unknown (05/30/2024)   Epic    Unable to Pay for Housing in the Last Year: No    Number of Times Moved in the Last Year: Not on file    Homeless in the Last Year: No  Utilities: Not At Risk (05/30/2024)   Epic    Threatened with loss of utilities: No  Health Literacy: Inadequate Health Literacy (07/04/2023)   B1300 Health Literacy    Frequency of need for help with medical instructions: Often    Family History  Problem Relation Age of Onset   Diabetes Mother    Cancer Neg Hx     Current Outpatient Medications  Medication Sig Dispense Refill   Accu-Chek FastClix Lancets MISC Use to check blood sugar 1 time a day 102 each 3   acetaminophen  (TYLENOL ) 325 MG tablet Take 650 mg by mouth  every 4 (four) hours as needed.     albuterol  (VENTOLIN  HFA) 108 (90 Base) MCG/ACT inhaler Inhale 2 puffs into the lungs every 6 (six) hours as needed for wheezing or shortness of breath. 8.5 g 2   aspirin  EC 81 MG tablet Take 1 tablet (81 mg total) by mouth daily. 90 tablet 3   atorvastatin  (LIPITOR ) 80 MG tablet Take 1 tablet (80 mg total) by mouth daily. 90 tablet 0   carvedilol  (COREG ) 25 MG tablet Take 1 tablet (  25 mg total) by mouth 2 (two) times daily. 180 tablet 0   clopidogrel  (PLAVIX ) 75 MG tablet Take 1 tablet (75 mg total) by mouth daily. 90 tablet 5   clopidogrel  (PLAVIX ) 75 MG tablet Take 1 tablet (75 mg total) by mouth daily. 90 tablet 0   dorzolamide -timolol  (COSOPT ) 2-0.5 % ophthalmic solution Place 1 drop into both eyes 2 (two) times daily. 15 mL 12   dorzolamide -timolol  (COSOPT ) 2-0.5 % ophthalmic solution Place 1 drop into the right eye 2 (two) times daily. 10 mL 12   empagliflozin  (JARDIANCE ) 10 MG TABS tablet Take 1 tablet (10 mg total) by mouth daily before breakfast. 90 tablet 3   eplerenone  (INSPRA ) 25 MG tablet Take 1/2  tablet (12.5 mg total) by mouth daily. 45 tablet 3   ezetimibe  (ZETIA ) 10 MG tablet Take 1 tablet (10 mg total) by mouth daily. 90 tablet 3   furosemide  (LASIX ) 40 MG tablet Take 1 tablet (40 mg total) by mouth as needed (for weight gain of 3 lbs in a day or 5 lbs in a week). 30 tablet 3   gentamicin  cream (GARAMYCIN ) 0.1 % Apply to affected area once daily. 30 g 1   glucose blood test strip USE TO CHECK BLOOD SUGAR ONCE DAILY 100 strip 12   Insulin  Pen Needle (UNIFINE PENTIPS) 31G X 5 MM MISC Use daily as directed 100 each 5   Lancets Misc. (ACCU-CHEK FASTCLIX LANCET) KIT Use as directed once daily 1 kit 1   latanoprost  (XALATAN ) 0.005 % ophthalmic solution Place 1 drop into the right eye every evening. 7.5 mL 12   lidocaine  (XYLOCAINE ) 5 % ointment Apply 1 Application topically as needed. 50 g 3   metFORMIN  (GLUCOPHAGE ) 500 MG tablet Take 1 tablet (500  mg total) by mouth 2 (two) times daily with a meal. 180 tablet 0   nitroGLYCERIN  (NITROSTAT ) 0.4 MG SL tablet Use one pill every 5 minutes X 3 if needed for chest pain. Contact MD if pain does not improve 30 tablet 0   sacubitril -valsartan  (ENTRESTO ) 49-51 MG Take 1 tablet by mouth 2 (two) times daily. 60 tablet 11   vitamin B-12 (CYANOCOBALAMIN ) 1000 MCG tablet Take 1 tablet (1,000 mcg total) by mouth daily. 90 tablet 1   No current facility-administered medications for this visit.    Allergies[1]   REVIEW OF SYSTEMS:  Negative unless noted in HPI [X]  denotes positive finding, [ ]  denotes negative finding Cardiac  Comments:  Chest pain or chest pressure:    Shortness of breath upon exertion:    Short of breath when lying flat:    Irregular heart rhythm:        Vascular    Pain in calf, thigh, or hip brought on by ambulation:    Pain in feet at night that wakes you up from your sleep:     Blood clot in your veins:    Leg swelling:         Pulmonary    Oxygen at home:    Productive cough:     Wheezing:         Neurologic    Sudden weakness in arms or legs:     Sudden numbness in arms or legs:     Sudden onset of difficulty speaking or slurred speech:    Temporary loss of vision in one eye:     Problems with dizziness:         Gastrointestinal    Blood in stool:  Vomited blood:         Genitourinary    Burning when urinating:     Blood in urine:        Psychiatric    Major depression:         Hematologic    Bleeding problems:    Problems with blood clotting too easily:        Skin    Rashes or ulcers:        Constitutional    Fever or chills:      PHYSICAL EXAMINATION:  Vitals:   08/22/24 1435  BP: (!) 164/69  Pulse: 69  Weight: 143 lb 4.8 oz (65 kg)  Height: 5' 6 (1.676 m)    General:  WDWN in NAD; vital signs documented above Gait: Not observed, In wheel chair HENT: WNL, normocephalic Pulmonary: normal non-labored breathing Cardiac:  regular HR Abdomen: soft Vascular Exam/Pulses: 2+ femoral pulses, Doppler DP/PT signals bilaterally  Extremities: without ischemic changes, without Gangrene , without cellulitis; without open wounds;  Musculoskeletal: no muscle wasting or atrophy  Neurologic: A&O X 3 Psychiatric:  The pt has Normal affect.   Non-Invasive Vascular Imaging:   +-------+-----------+-----------+------------+------------+  ABI/TBIToday's ABIToday's TBIPrevious ABIPrevious TBI  +-------+-----------+-----------+------------+------------+  Right [place]         0.46       [place]          0.35          +-------+-----------+-----------+------------+------------+  Left  [place]         0.65       [place]          0.36          +-------+-----------+-----------+------------+------------+    ASSESSMENT/PLAN:: 74 y.o. male presents for surveillance follow up of PAD. He recently underwent Aortogram, Arteriogram of LLE with intravascular lithotripsy of left SFA, shockwave and Balloon angioplasty and stenting of left SFA by Dr. Pearline on 04/21/24. This was performed due to left heel wound. He is followed by Dr. Silva at [facility]. His left heel wound is healed. He has no claudication, rest pain or tissue loss.  - ABI today shows improvement of BLE - discussed importance of regular foot checks and keeping his feet well protected - encourage walking regimen - Continue aspirin , Statin, Plavix  - Advised them to follow up earlier if any new or concerning symptoms - he will follow up again in 6 months with LLE arterial duplex and ABI   Teretha Damme, PA-C [facility] [phone]  Clinic MD:   Pearline      [1]  Allergies Allergen Reactions   Bidil [Isosorb Dinitrate-Hydralazine ] Other (See Comments)    Patient became lightheaded with 1 tablet tid.    "

## 2024-08-24 ENCOUNTER — Other Ambulatory Visit: Payer: Self-pay

## 2024-08-26 ENCOUNTER — Other Ambulatory Visit: Payer: Self-pay | Admitting: *Deleted

## 2024-08-26 DIAGNOSIS — I739 Peripheral vascular disease, unspecified: Secondary | ICD-10-CM

## 2024-08-26 DIAGNOSIS — I70222 Atherosclerosis of native arteries of extremities with rest pain, left leg: Secondary | ICD-10-CM

## 2024-08-26 DIAGNOSIS — I70244 Atherosclerosis of native arteries of left leg with ulceration of heel and midfoot: Secondary | ICD-10-CM

## 2024-08-27 ENCOUNTER — Other Ambulatory Visit (HOSPITAL_COMMUNITY): Payer: Self-pay

## 2024-08-28 ENCOUNTER — Other Ambulatory Visit (HOSPITAL_COMMUNITY): Payer: Self-pay

## 2024-08-28 ENCOUNTER — Telehealth: Payer: Self-pay | Admitting: Internal Medicine

## 2024-08-28 MED ORDER — METFORMIN HCL 500 MG PO TABS
500.0000 mg | ORAL_TABLET | Freq: Two times a day (BID) | ORAL | 0 refills | Status: AC
  Filled 2024-08-28: qty 180, 90d supply, fill #0

## 2024-08-28 NOTE — Telephone Encounter (Signed)
 Copied from CRM #[id number]. Topic: Clinical - Medication Refill >> Aug 28, 2024 11:21 AM DeAngela L wrote: Medication: metFORMIN  (GLUCOPHAGE ) 500 MG tablet   Has the patient contacted their pharmacy? Yes  (Agent: If no, request that the patient contact the pharmacy for the refill. If patient does not wish to contact the pharmacy document the reason why and proceed with request.) (Agent: If yes, when and what did the pharmacy advise?)  This is the patient's preferred pharmacy:  [facility] [address] [place] [address] Phone: [phone] Fax: [phone]  Is this the correct pharmacy for this prescription? Yes  If no, delete pharmacy and type the correct one.   Has the prescription been filled recently? Yes  Is the patient out of the medication? No   Has the patient been seen for an appointment in the last year OR does the patient have an upcoming appointment? Yes  Can we respond through MyChart? No  Agent: Please be advised that Rx refills may take up to 3 business days. We ask that you follow-up with your pharmacy.

## 2024-09-01 ENCOUNTER — Ambulatory Visit: Payer: Self-pay | Admitting: Internal Medicine

## 2024-10-21 ENCOUNTER — Ambulatory Visit: Admitting: Podiatry

## 2025-03-13 ENCOUNTER — Ambulatory Visit (HOSPITAL_COMMUNITY)

## 2025-03-13 ENCOUNTER — Ambulatory Visit
